# Patient Record
Sex: Female | Born: 1935 | Race: White | Hispanic: No | Marital: Single | Smoking: Former smoker
Health system: Southern US, Community
[De-identification: ages and names within clinical notes are randomized; demographics above are authoritative.]

## PROBLEM LIST (undated history)

## (undated) DIAGNOSIS — I951 Orthostatic hypotension: Secondary | ICD-10-CM

## (undated) DIAGNOSIS — L02619 Cutaneous abscess of unspecified foot: Secondary | ICD-10-CM

## (undated) DIAGNOSIS — I495 Sick sinus syndrome: Secondary | ICD-10-CM

## (undated) DIAGNOSIS — I071 Rheumatic tricuspid insufficiency: Secondary | ICD-10-CM

## (undated) DIAGNOSIS — N189 Chronic kidney disease, unspecified: Secondary | ICD-10-CM

## (undated) DIAGNOSIS — I10 Essential (primary) hypertension: Secondary | ICD-10-CM

## (undated) DIAGNOSIS — I272 Pulmonary hypertension, unspecified: Secondary | ICD-10-CM

## (undated) DIAGNOSIS — I472 Ventricular tachycardia: Secondary | ICD-10-CM

## (undated) DIAGNOSIS — L03119 Cellulitis of unspecified part of limb: Secondary | ICD-10-CM

## (undated) DIAGNOSIS — C180 Malignant neoplasm of cecum: Secondary | ICD-10-CM

## (undated) DIAGNOSIS — K7460 Unspecified cirrhosis of liver: Secondary | ICD-10-CM

## (undated) DIAGNOSIS — K2971 Gastritis, unspecified, with bleeding: Secondary | ICD-10-CM

## (undated) DIAGNOSIS — M4800 Spinal stenosis, site unspecified: Secondary | ICD-10-CM

## (undated) DIAGNOSIS — G903 Multi-system degeneration of the autonomic nervous system: Secondary | ICD-10-CM

## (undated) DIAGNOSIS — I5042 Chronic combined systolic (congestive) and diastolic (congestive) heart failure: Secondary | ICD-10-CM

## (undated) DIAGNOSIS — R1110 Vomiting, unspecified: Secondary | ICD-10-CM

## (undated) DIAGNOSIS — I5032 Chronic diastolic (congestive) heart failure: Secondary | ICD-10-CM

## (undated) DIAGNOSIS — I4729 Other ventricular tachycardia: Secondary | ICD-10-CM

## (undated) DIAGNOSIS — I059 Rheumatic mitral valve disease, unspecified: Secondary | ICD-10-CM

## (undated) DIAGNOSIS — D649 Anemia, unspecified: Secondary | ICD-10-CM

## (undated) DIAGNOSIS — J471 Bronchiectasis with (acute) exacerbation: Secondary | ICD-10-CM

## (undated) DIAGNOSIS — I482 Chronic atrial fibrillation, unspecified: Secondary | ICD-10-CM

## (undated) DIAGNOSIS — K922 Gastrointestinal hemorrhage, unspecified: Secondary | ICD-10-CM

## (undated) HISTORY — DX: Chronic kidney disease, unspecified: N18.9

## (undated) HISTORY — DX: Multi-system degeneration of the autonomic nervous system: G90.3

## (undated) HISTORY — PX: APPENDECTOMY: SHX54

## (undated) HISTORY — DX: Sick sinus syndrome: I49.5

## (undated) HISTORY — DX: Chronic atrial fibrillation, unspecified: I48.20

## (undated) HISTORY — PX: VALVE REPLACEMENT: SUR13

## (undated) HISTORY — DX: Gastritis, unspecified, with bleeding: K29.71

## (undated) HISTORY — DX: Spinal stenosis, site unspecified: M48.00

## (undated) HISTORY — DX: Orthostatic hypotension: I95.1

## (undated) HISTORY — DX: Malignant neoplasm of cecum: C18.0

## (undated) HISTORY — PX: CHOLECYSTECTOMY: SHX55

## (undated) HISTORY — DX: Chronic combined systolic (congestive) and diastolic (congestive) heart failure: I50.42

## (undated) HISTORY — DX: Bronchiectasis with (acute) exacerbation: J47.1

## (undated) HISTORY — DX: Rheumatic tricuspid insufficiency: I07.1

## (undated) HISTORY — DX: Vomiting, unspecified: R11.10

## (undated) HISTORY — DX: Chronic diastolic (congestive) heart failure: I50.32

## (undated) HISTORY — DX: Anemia, unspecified: D64.9

## (undated) HISTORY — DX: Pulmonary hypertension, unspecified: I27.20

---

## 2001-12-03 ENCOUNTER — Other Ambulatory Visit: Admission: RE | Admit: 2001-12-03 | Discharge: 2001-12-03 | Payer: Self-pay | Admitting: Geriatric Medicine

## 2001-12-12 ENCOUNTER — Encounter: Payer: Self-pay | Admitting: Geriatric Medicine

## 2001-12-12 ENCOUNTER — Encounter: Admission: RE | Admit: 2001-12-12 | Discharge: 2001-12-12 | Payer: Self-pay | Admitting: Geriatric Medicine

## 2001-12-21 ENCOUNTER — Encounter: Admission: RE | Admit: 2001-12-21 | Discharge: 2001-12-21 | Payer: Self-pay | Admitting: Geriatric Medicine

## 2001-12-21 ENCOUNTER — Encounter: Payer: Self-pay | Admitting: Geriatric Medicine

## 2001-12-24 ENCOUNTER — Encounter: Payer: Self-pay | Admitting: Geriatric Medicine

## 2001-12-24 ENCOUNTER — Encounter: Admission: RE | Admit: 2001-12-24 | Discharge: 2001-12-24 | Payer: Self-pay | Admitting: Geriatric Medicine

## 2002-01-07 ENCOUNTER — Encounter: Payer: Self-pay | Admitting: Geriatric Medicine

## 2002-01-07 ENCOUNTER — Encounter: Admission: RE | Admit: 2002-01-07 | Discharge: 2002-01-07 | Payer: Self-pay | Admitting: Geriatric Medicine

## 2002-04-06 ENCOUNTER — Encounter: Payer: Self-pay | Admitting: Geriatric Medicine

## 2002-04-06 ENCOUNTER — Encounter: Admission: RE | Admit: 2002-04-06 | Discharge: 2002-04-06 | Payer: Self-pay | Admitting: Geriatric Medicine

## 2002-05-09 ENCOUNTER — Ambulatory Visit (HOSPITAL_COMMUNITY): Admission: RE | Admit: 2002-05-09 | Discharge: 2002-05-09 | Payer: Self-pay | Admitting: Cardiology

## 2002-06-24 ENCOUNTER — Encounter: Payer: Self-pay | Admitting: Cardiothoracic Surgery

## 2002-06-24 ENCOUNTER — Encounter: Admission: RE | Admit: 2002-06-24 | Discharge: 2002-06-24 | Payer: Self-pay | Admitting: Cardiothoracic Surgery

## 2002-07-07 ENCOUNTER — Ambulatory Visit (HOSPITAL_COMMUNITY): Admission: RE | Admit: 2002-07-07 | Discharge: 2002-07-07 | Payer: Self-pay | Admitting: Cardiology

## 2002-07-07 ENCOUNTER — Encounter (INDEPENDENT_AMBULATORY_CARE_PROVIDER_SITE_OTHER): Payer: Self-pay | Admitting: Cardiology

## 2002-07-11 ENCOUNTER — Encounter: Payer: Self-pay | Admitting: Cardiothoracic Surgery

## 2002-07-13 ENCOUNTER — Encounter: Payer: Self-pay | Admitting: Cardiothoracic Surgery

## 2002-07-13 ENCOUNTER — Inpatient Hospital Stay (HOSPITAL_COMMUNITY): Admission: RE | Admit: 2002-07-13 | Discharge: 2002-07-28 | Payer: Self-pay | Admitting: Cardiothoracic Surgery

## 2002-07-14 ENCOUNTER — Encounter: Payer: Self-pay | Admitting: Cardiothoracic Surgery

## 2002-07-15 ENCOUNTER — Encounter: Payer: Self-pay | Admitting: Cardiothoracic Surgery

## 2002-07-16 ENCOUNTER — Encounter: Payer: Self-pay | Admitting: Cardiothoracic Surgery

## 2002-07-17 ENCOUNTER — Encounter: Payer: Self-pay | Admitting: Cardiothoracic Surgery

## 2002-07-18 ENCOUNTER — Encounter: Payer: Self-pay | Admitting: Cardiothoracic Surgery

## 2002-07-19 ENCOUNTER — Encounter: Payer: Self-pay | Admitting: Cardiothoracic Surgery

## 2002-07-20 ENCOUNTER — Encounter: Payer: Self-pay | Admitting: Cardiothoracic Surgery

## 2002-07-25 ENCOUNTER — Encounter: Payer: Self-pay | Admitting: Cardiothoracic Surgery

## 2002-07-26 ENCOUNTER — Encounter: Payer: Self-pay | Admitting: Cardiothoracic Surgery

## 2002-09-12 ENCOUNTER — Encounter (HOSPITAL_COMMUNITY): Admission: RE | Admit: 2002-09-12 | Discharge: 2002-12-11 | Payer: Self-pay | Admitting: Cardiology

## 2002-09-16 ENCOUNTER — Inpatient Hospital Stay (HOSPITAL_COMMUNITY): Admission: AD | Admit: 2002-09-16 | Discharge: 2002-09-19 | Payer: Self-pay | Admitting: Cardiology

## 2002-10-03 ENCOUNTER — Ambulatory Visit (HOSPITAL_COMMUNITY): Admission: RE | Admit: 2002-10-03 | Discharge: 2002-10-03 | Payer: Self-pay | Admitting: Cardiology

## 2002-12-12 ENCOUNTER — Encounter (HOSPITAL_COMMUNITY): Admission: RE | Admit: 2002-12-12 | Discharge: 2003-03-12 | Payer: Self-pay | Admitting: Cardiology

## 2003-03-29 ENCOUNTER — Encounter: Admission: RE | Admit: 2003-03-29 | Discharge: 2003-03-29 | Payer: Self-pay | Admitting: Geriatric Medicine

## 2003-03-29 ENCOUNTER — Encounter: Payer: Self-pay | Admitting: Geriatric Medicine

## 2003-03-29 ENCOUNTER — Encounter: Payer: Self-pay | Admitting: Diagnostic Radiology

## 2003-04-12 ENCOUNTER — Encounter: Admission: RE | Admit: 2003-04-12 | Discharge: 2003-04-12 | Payer: Self-pay | Admitting: Geriatric Medicine

## 2003-04-12 ENCOUNTER — Encounter: Payer: Self-pay | Admitting: Geriatric Medicine

## 2003-04-26 ENCOUNTER — Encounter: Admission: RE | Admit: 2003-04-26 | Discharge: 2003-04-26 | Payer: Self-pay | Admitting: Geriatric Medicine

## 2003-04-26 ENCOUNTER — Encounter: Payer: Self-pay | Admitting: Geriatric Medicine

## 2004-09-05 ENCOUNTER — Ambulatory Visit (HOSPITAL_COMMUNITY): Admission: RE | Admit: 2004-09-05 | Discharge: 2004-09-05 | Payer: Self-pay | Admitting: Cardiology

## 2004-09-16 ENCOUNTER — Encounter: Admission: RE | Admit: 2004-09-16 | Discharge: 2004-09-16 | Payer: Self-pay | Admitting: Geriatric Medicine

## 2005-01-23 ENCOUNTER — Encounter: Admission: RE | Admit: 2005-01-23 | Discharge: 2005-01-23 | Payer: Self-pay | Admitting: Geriatric Medicine

## 2005-02-17 ENCOUNTER — Encounter: Admission: RE | Admit: 2005-02-17 | Discharge: 2005-02-17 | Payer: Self-pay | Admitting: Geriatric Medicine

## 2005-07-07 ENCOUNTER — Ambulatory Visit: Payer: Self-pay | Admitting: Critical Care Medicine

## 2005-07-09 ENCOUNTER — Other Ambulatory Visit: Admission: RE | Admit: 2005-07-09 | Discharge: 2005-07-09 | Payer: Self-pay | Admitting: Geriatric Medicine

## 2005-07-11 ENCOUNTER — Ambulatory Visit: Payer: Self-pay | Admitting: Internal Medicine

## 2005-07-15 ENCOUNTER — Ambulatory Visit (HOSPITAL_COMMUNITY): Admission: RE | Admit: 2005-07-15 | Discharge: 2005-07-15 | Payer: Self-pay | Admitting: Cardiology

## 2006-06-19 ENCOUNTER — Encounter: Admission: RE | Admit: 2006-06-19 | Discharge: 2006-06-19 | Payer: Self-pay | Admitting: Urology

## 2006-07-29 ENCOUNTER — Encounter: Admission: RE | Admit: 2006-07-29 | Discharge: 2006-07-29 | Payer: Self-pay | Admitting: Geriatric Medicine

## 2006-08-19 ENCOUNTER — Ambulatory Visit (HOSPITAL_COMMUNITY): Admission: RE | Admit: 2006-08-19 | Discharge: 2006-08-19 | Payer: Self-pay | Admitting: Cardiology

## 2006-09-02 ENCOUNTER — Ambulatory Visit: Payer: Self-pay | Admitting: Internal Medicine

## 2006-09-09 ENCOUNTER — Ambulatory Visit (HOSPITAL_COMMUNITY): Admission: RE | Admit: 2006-09-09 | Discharge: 2006-09-09 | Payer: Self-pay | Admitting: Internal Medicine

## 2006-09-09 ENCOUNTER — Ambulatory Visit: Payer: Self-pay | Admitting: Cardiology

## 2006-09-17 ENCOUNTER — Ambulatory Visit: Payer: Self-pay | Admitting: Internal Medicine

## 2006-09-23 ENCOUNTER — Ambulatory Visit: Payer: Self-pay | Admitting: Internal Medicine

## 2006-09-30 ENCOUNTER — Ambulatory Visit: Payer: Self-pay | Admitting: Internal Medicine

## 2006-09-30 ENCOUNTER — Ambulatory Visit (HOSPITAL_COMMUNITY): Admission: RE | Admit: 2006-09-30 | Discharge: 2006-10-01 | Payer: Self-pay | Admitting: Internal Medicine

## 2006-10-06 ENCOUNTER — Ambulatory Visit: Payer: Self-pay

## 2006-10-06 ENCOUNTER — Encounter: Payer: Self-pay | Admitting: Internal Medicine

## 2006-10-06 ENCOUNTER — Ambulatory Visit: Payer: Self-pay | Admitting: Internal Medicine

## 2006-10-07 ENCOUNTER — Ambulatory Visit: Payer: Self-pay

## 2006-10-22 ENCOUNTER — Encounter: Admission: RE | Admit: 2006-10-22 | Discharge: 2006-10-22 | Payer: Self-pay | Admitting: Geriatric Medicine

## 2006-10-22 ENCOUNTER — Ambulatory Visit: Payer: Self-pay | Admitting: Internal Medicine

## 2006-10-23 ENCOUNTER — Inpatient Hospital Stay (HOSPITAL_COMMUNITY): Admission: AD | Admit: 2006-10-23 | Discharge: 2006-10-26 | Payer: Self-pay | Admitting: Cardiology

## 2006-10-26 ENCOUNTER — Ambulatory Visit (HOSPITAL_COMMUNITY): Admission: RE | Admit: 2006-10-26 | Discharge: 2006-10-26 | Payer: Self-pay | Admitting: Internal Medicine

## 2007-01-12 ENCOUNTER — Encounter: Admission: RE | Admit: 2007-01-12 | Discharge: 2007-01-12 | Payer: Self-pay | Admitting: Geriatric Medicine

## 2007-01-20 ENCOUNTER — Encounter: Admission: RE | Admit: 2007-01-20 | Discharge: 2007-01-20 | Payer: Self-pay | Admitting: Geriatric Medicine

## 2007-07-17 ENCOUNTER — Encounter: Admission: RE | Admit: 2007-07-17 | Discharge: 2007-07-17 | Payer: Self-pay | Admitting: Orthopaedic Surgery

## 2007-07-26 ENCOUNTER — Observation Stay (HOSPITAL_COMMUNITY): Admission: AD | Admit: 2007-07-26 | Discharge: 2007-07-26 | Payer: Self-pay | Admitting: Cardiology

## 2007-09-06 ENCOUNTER — Ambulatory Visit: Payer: Self-pay

## 2007-09-06 ENCOUNTER — Inpatient Hospital Stay (HOSPITAL_COMMUNITY): Admission: AD | Admit: 2007-09-06 | Discharge: 2007-09-10 | Payer: Self-pay | Admitting: Cardiology

## 2007-12-03 ENCOUNTER — Ambulatory Visit (HOSPITAL_COMMUNITY): Admission: RE | Admit: 2007-12-03 | Discharge: 2007-12-04 | Payer: Self-pay | Admitting: Orthopaedic Surgery

## 2008-02-07 ENCOUNTER — Encounter: Admission: RE | Admit: 2008-02-07 | Discharge: 2008-02-07 | Payer: Self-pay | Admitting: Orthopaedic Surgery

## 2008-08-02 ENCOUNTER — Ambulatory Visit: Payer: Self-pay | Admitting: Cardiovascular Disease

## 2008-10-23 ENCOUNTER — Ambulatory Visit (HOSPITAL_COMMUNITY): Admission: RE | Admit: 2008-10-23 | Discharge: 2008-10-23 | Payer: Self-pay | Admitting: Cardiology

## 2009-03-08 ENCOUNTER — Encounter (INDEPENDENT_AMBULATORY_CARE_PROVIDER_SITE_OTHER): Payer: Self-pay | Admitting: *Deleted

## 2009-04-24 ENCOUNTER — Encounter: Admission: RE | Admit: 2009-04-24 | Discharge: 2009-04-24 | Payer: Self-pay | Admitting: Cardiology

## 2009-06-15 ENCOUNTER — Encounter: Admission: RE | Admit: 2009-06-15 | Discharge: 2009-06-15 | Payer: Self-pay | Admitting: Orthopaedic Surgery

## 2010-12-08 ENCOUNTER — Encounter: Payer: Self-pay | Admitting: Gastroenterology

## 2010-12-08 ENCOUNTER — Encounter: Payer: Self-pay | Admitting: Geriatric Medicine

## 2010-12-08 ENCOUNTER — Encounter: Payer: Self-pay | Admitting: Orthopaedic Surgery

## 2011-04-01 NOTE — Letter (Signed)
August 02, 2008    [name], MD  [address] [place]  [place], [place] [address]   RE:  Sherry, [name]  MRN:  [id number]  /  DOB:  August 03, 1926   Dear Tressia Miners,   I had the pleasure to see [name] today at your request because of  concerns about her atrial fibrillation.  She had come to see you last  week because of complaints of swelling and you had increased her  diuretic.  That has not quite had a chance to take effect.  There was  also concern about having more atrial fibrillation.  I went back through  the records, which are somewhat incomplete, but looking back at the  initiation of Tikosyn last October after she had failed amiodarone, dose  of 125 mcg was chosen.  It was maintained in the context of an episode  of nonsustained VT and transient hypokalemia.  There was some question  in Gregg's mind whether a higher dose might be usable.  It was elected  to leave her on that dose.  I do not have intercurrent data except from  the March to the September time frame where in it was noted that she has  been in atrial fibrillation about 30% of the time.  However, for most of  the last 6 months she has felt really quite well until just recently  suggesting that some of her symptoms are not related to atrial  fibrillation itself, but may be some other consequence.   The other thing that I noted on interrogation of her device is that she  had been programmed into the DDIR mode.  This would result in her having  a nontracking pacemaker function in normal rhythm, which particularly  with her BiV would be deleterious.  This may also be contributing.   Her renal function has been stable.  The last numbers I see from your  office here today were 1 down from a high of 18 that I saw a number of  months ago.   Her medications include digoxin 0.0625, Lasix 20, Tikosyn 125 b.i.d.,  Fosamax, Coreg 12.5 b.i.d., and Coumadin.  She apparently is also taking  a potassium  supplementation.   She has no known drug allergies.   On examination, her blood pressure is 127/68, her pulse is 70, her  weight was 230, which is down 8 pounds in 6 months and down 15 pounds in  2 years.  Her lungs were clear.  Her neck veins were flat.  Her heart  sounds were regular.  The extremities had 1 to 2+ edema.  The skin was  warm and dry.   Interrogation of her pacemaker was notable as above.   I reprogrammed the mode to DDD.  I asked her to try and correlate her  symptoms with specific days that we can look back on the atrial  fibrillation log in her pacemaker and see if there is an association.  In the event that there was one found, it would be at least worth  considering readmitting her to hospital for uptitration of her  amiodarone.  However, I suspect that what we will find is that managing  her edema will suffice to mitigate her symptoms and no further  interventions will be necessary.   Thanks very much for allowing [place] to see her.    Sincerely,      Deboraha Sprang, MD, [facility]  Electronically Signed    SCK/MedQ  DD:  08/02/2008  DT: 08/03/2008  Job #: [id number]

## 2011-04-01 NOTE — Op Note (Signed)
NAMEAVONA, CONLIN               ACCOUNT NO.:  [id number]   MEDICAL RECORD NO.:  [id number]          PATIENT TYPE:  INP   LOCATION:  2019                         FACILITY:  [facility]   PHYSICIAN:  Champ Mungo. Lovena Le, MD    DATE OF BIRTH:  Mar 01, 1926   DATE OF PROCEDURE:  09/09/2007  DATE OF DISCHARGE:  09/10/2007                               OPERATIVE REPORT   PROCEDURE PERFORMED:  DC cardioversion.   INDICATIONS:  Symptomatic atrial fibrillation.   INTRODUCTION:  The patient is a 77 year old woman with a history of  persistent atrial fibrillation who was admitted to the hospital for  [place] therapy.  She has not returned to sinus rhythm spontaneously and  she is now referred for DC cardioversion.   PROCEDURE:  After informed consent was obtained, the patient was prepped  in the usual manner.  The electrode dispersive pads were placed in the  anterior, posterior position, 200 joules of synchronized biphasic energy  was subsequently delivered through the electrode dispersing pads  restoring sinus rhythm.  A 12-lead EKG was ordered.  The patient  tolerated the procedure well, 10 of Valium and 15 mcg of fentanyl were  given intravenously for sedation.   COMPLICATIONS:  There were no immediate procedure complications.   RESULTS:  This demonstrates successful DC cardioversion in a patient  with persistent atrial fibrillation who was admitted to the hospital for  Tikosyn loading.      Champ Mungo. Lovena Le, MD  Electronically Signed     GWT/MEDQ  D:  09/09/2007  T:  09/10/2007  Job:  [id number]   cc:   [name], M.D.

## 2011-04-01 NOTE — Op Note (Signed)
[name], [name]                ACCOUNT NO.:  [id number]   MEDICAL RECORD NO.:  [id number]          PATIENT TYPE:  OIB   LOCATION:  2899                         FACILITY:  [facility]   PHYSICIAN:  [name], M.D.     DATE OF BIRTH:  05-02-1926   DATE OF PROCEDURE:  10/23/2008  DATE OF DISCHARGE:  10/23/2008                               OPERATIVE REPORT   REFERRING PHYSICIAN:  Hal T. Timblin, MD   PROCEDURE:  Direct current cardioversion.   OPERATOR:  [name], MD   INDICATIONS:  Atrial fibrillation.   COMPLICATIONS:  None.   IV MEDICATIONS:  Pentothal 200 mg.   INDICATIONS FOR PROCEDURE:  This is an 77 year old female who has a  history of tachybrady syndrome and has been on Tikosyn for suppression  of atrial fibrillation.  She is also status post permanent pacemaker  placement.  She recently presented to the hospital with recurrent atrial  fibrillation, now presents for cardioversion.  She has been adequately  anticoagulated with an INR of greater than 2 for over 4 weeks.   DESCRIPTION OF PROCEDURE:  The patient was brought to the day hospital  in a fasting nonsedated state.  Informed consent was obtained.  The  patient was connected to continuous heart rate and pulse oximetry  monitoring and intermittent blood pressure monitoring.  Defibrillator  pads were placed on the anterior and posterior chest on the left side.  After adequate anesthesia was obtained, a synchronized biphasic 120  joules shock was delivered which successfully converted the patient to  sinus rhythm and actually AV paced rhythm.  The patient tolerated the  procedure well with no complications.  Once the patient was fully awake  and ambulating, she was discharged to home.   ASSESSMENT:  1. Paroxysmal atrial fibrillation, currently in atrial fibrillation.  2. Systemic anticoagulation with therapeutic INR for greater than 4      weeks.  3. Tachybrady syndrome status post permanent pacemaker  placement.  4. Successful cardioversion to atrioventricular paced rhythm.   PLAN:  1. Discharge to home after fully awake and ambulating.  2. Increase Coreg to 12.5 mg b.i.d.  3. Follow up with me in 2 weeks.      [name], M.D.  Electronically Signed     TT/MEDQ  D:  10/23/2008  T:  10/23/2008  Job:  [id number]   cc:   Hal T. Stoneking, M.D.

## 2011-04-01 NOTE — Consult Note (Signed)
[name], [name]               ACCOUNT NO.:  [id number]   MEDICAL RECORD NO.:  [id number]          PATIENT TYPE:  INP   LOCATION:  2019                         FACILITY:  [facility]   PHYSICIAN:  Champ Mungo. Lovena Le, MD    DATE OF BIRTH:  08-19-1926   DATE OF CONSULTATION:  09/09/2007  DATE OF DISCHARGE:  09/10/2007                                 CONSULTATION   INDICATION FOR CONSULTATION:  Evaluation of atrial fibrillation and  Tikosyn therapy in a patient with nonsustained VT and congestive heart  failure.   HISTORY OF PRESENT ILLNESS:  The patient is a 77 year old morbidly obese  middle-aged woman with a history of mitral valve regurgitation status  post mitral valve repair also with a history of persistent atrial  fibrillation and nonsustained VT and congestive heart failure thought to  be due to both systolic and diastolic dysfunction.  She has had  persistent atrial fibrillation for several years and initially was on  amiodarone, but unfortunately has developed recurrent atrial  fibrillation.  Her amiodarone was stopped over a year ago.  She  underwent BiV pacemaker upgrade, but has continued to have heart failure  symptoms.  She does note that she has undergone multiple cardioversions,  and whenever she has been cardioverted and stays in sinus rhythm she  feels better.  She was admitted to the hospital for initiation of  Tikosyn therapy.  Because of her prolonged baseline prolongation of the  QRS and the QT interval secondary to pacing, her Tikosyn dose was  decreased from 250 mcg twice daily which had been calculated out based  on her creatinine clearance down to 125 mcg twice daily.  She is now  referred for additional evaluation and additional recommendations.   ADDITIONAL PAST MEDICAL HISTORY:  [name] additional past medical  history is notable for no known coronary disease.  She has a history of  morbid obesity.  She has a history of spinal stenosis and weakness in  her lower extremity secondary to this.  She has a history of chronic  Coumadin therapy with therapeutic INR's.   FAMILY HISTORY:  Was really noncontributory.   SOCIAL HISTORY:  The patient denies tobacco or ethanol abuse.   REVIEW OF SYSTEMS:  Is as noted in the HPI.  Also she has pain and  weakness in her legs bilaterally.  She has generalized fatigue.  She  gets shortness of breath with exertion.  She has dyspnea.  Rest of her  Review of Systems was negative.   PHYSICAL EXAMINATION:  GENERAL:  She is a pleasant obese elderly woman  in no distress.  VITAL SIGNS:  Blood pressure 112/60, pulse 73 and regular, respirations  18, temperature 98.  HEENT:  Normocephalic, atraumatic.  Pupils are equal and round.  Oropharynx moist.  Sclerae anicteric.  NECK:  Revealed no jugular venous distention.  There was no thyromegaly.  Trachea is midline.  Carotids 2+ and symmetric.  LUNGS:  Clear bilaterally to auscultation.  No wheezes, rales or rhonchi  are present.  CARDIAC:  Regular rate and rhythm with split S2.  There  were no obvious  murmurs, rubs or gallops appreciated.  ABDOMEN:  Was obese, nontender, nondistended.  No organomegaly.  Bowel  sounds are present.  No rebound or guarding.  EXTREMITIES:  Demonstrated no cyanosis, clubbing or edema.  Pulses are  2+ and symmetric.  NEUROLOGIC:  Alert and oriented x3, cranial nerves intact.  Strength 5/5  and symmetric.   EKG demonstrates atrial fibrillation with biventricular pacing.  The QRS  duration was 160 milliseconds.   IMPRESSION:  1. Persistent atrial fibrillation.  2. Admission for Tikosyn therapy.  3. Prolonged QRS secondary to biventricular pacing.  4. Mitral regurgitation status post mitral valve repair.  5. Mixed congestive heart failure secondary to both systolic and      diastolic dysfunction both acute and chronic.  6. Mild hypokalemia.   DISCUSSION:  First I have recommended repletion of her potassium.  Secondly I would  recommend DC cardioversion now that she has over five  doses of Tikosyn.  Consideration could be made for going back up on her  Tikosyn dose 250 twice daily which would make her more likely to  maintain sinus rhythm.  However, with her other comorbidities and at  least one documented episode of nonsustained VT I think it is reasonable  to try her at the lower dose.  Ultimate likelihood of  maintaining sinus  rhythm is less based on her low dose of Tikosyn, underlying mitral valve  disease, and triopathy secondary to this and the fact that she failed  amiodarone in the past along with a relative longstanding atrial  fibrillation.      Champ Mungo. Lovena Le, MD  Electronically Signed     GWT/MEDQ  D:  09/10/2007  T:  09/11/2007  Job:  [id number]   cc:   [name], M.D.  Hal T. Stoneking, M.D.

## 2011-04-01 NOTE — Op Note (Signed)
[name], [name]               ACCOUNT NO.:  [id number]   MEDICAL RECORD NO.:  [id number]          PATIENT TYPE:  OIB   LOCATION:  2550                         FACILITY:  [facility]   PHYSICIAN:  Mark C. Lorin Mercy, M.D.    DATE OF BIRTH:  [birth year]   DATE OF PROCEDURE:  12/03/2007  DATE OF DISCHARGE:                               OPERATIVE REPORT   PREOPERATIVE DIAGNOSIS:  L4-5 spinal stenosis.   POSTOPERATIVE DIAGNOSIS:  L4-5 spinal stenosis.   PROCEDURE:  L4-5 decompression.   SURGEON:  Rodell Perna, MD   ASSISTANT:  Phillips Hay, PA-C.   ANESTHESIA:  GOT plus Marcaine local.   SKIN CLOSURE:  Subcuticular.   ESTIMATED BLOOD LOSS:  150 mL.   BRIEF HISTORY:  A 77 year old female who has been on chronic Coumadin  with a pacemaker, has been on Lovenox for the last five days and has  numerous bruises over her abdomen where she has been injecting Lovenox.  Protime has returned to normal at 1.1, which was checked preop and on  November 29, 2007, four days ago, her INR was 1.5.  INR is now 1.1.   PROCEDURE:  After induction of general anesthesia, the patient was  placed prone, preoperative antibiotics was given, area was squared with  towels.  Betadine and Vi-drape application and then time-out procedure  with confirmation.   A spinal needle was inserted and an x-ray was taken with a double  exposure due to the patient's body habitus and morbid obesity.  First x-  ray showed the needle was down at S1 since bony landmarks such as iliac  crest were difficult to palpate.  Needle was repositioned more  proximally, incision was made and needle was inspected to be just above  the 4-5 disk space.  Cross-table x-ray confirmed this.  Incision was  made down to the lamina.  The lamina of L4 was removed to the top of L5.  Bone bur was used, extended down to the hypertrophic ligamentum and then  large chunks of ligament were removed.  Operative microscope was draped  and brought in.  There were  some small bleeders present in the fatty  layer, muscular layer and bone wax was used as well as Gelfoam and  paddies to try to maintain hemostasis with the patient's expected  postoperative anticoagulation.  There was significant spur overhang over  the nerve root.  Disk was inspected on both sides and showed some mild  protrusion, but no microdiskectomy was performed.  Nerve roots were  decompressed, followed out until they were free and all thick chunks of  ligament were removed and bone was decompressed out to the level of the  pedicle.  Foraminotomies were performed bilaterally.  The wound was  irrigated.  Some epineural veins were coagulated.  After irrigation,  Hemovac was placed  with the patient restarting Lovenox to help with any possible hematoma.  Deep fascia closed with 0 Vicryl, 2-0 Vicryl subcutaneous tissue,  subcuticular skin closure.  Tincture of Benzoin and Steri-Strips, postop  dressing and transferred to the recovery room in stable condition.  Instrument  and needle count was correct.      Mark C. Lorin Mercy, M.D.  Electronically Signed     MCY/MEDQ  D:  12/03/2007  T:  12/03/2007  Job:  [id number]

## 2011-04-04 NOTE — Letter (Signed)
October 07, 2006    [name], M.D.  [address] [place], [place] [address]   RE:  [name], Sherry  MRN:  [id number]  /  DOB:  [birth year]   Dear [name],   [name] comes in following  resynchronization for congestive heart  failure in the setting of modest LV function, prior pacemaker implantation  for complete heart block.  Since having gone home, she has lost 7 pounds.  She continues to complain of shortness of breath.  She also complains of  recurrent episodes of nausea, vomiting, which predate her procedure.  In  fact, they go back for some time.  She had an episode yesterday where she  was quite pale.   She was seen yesterday by Dr. Lovena Le.  An echo was obtained that  demonstrated no effusion, improvement in the left ventricular function.   Laboratories were obtained with a somewhat elevated BUN and creatinine,  going from 22/1.6 to 22/1.7.  BNP was 187 with one not previously measured.   Her medications currently include Coreg 12.5 b.i.d., furosemide 80 b.i.d.,  Coumadin and Diovan.   PHYSICAL EXAMINATION:  VITAL SIGNS:  Blood pressure 118/58, pulse 76.  LUNGS:  Clear.  HEART:  Heart sounds were regular.  EXTREMITIES:  Without edema.   IMPRESSION:  1. Heart failure, systolic and diastolic.  2. Paroxysmal atrial fibrillation, holding sinus rhythm.  3. Nausea and vomiting, recurrent.   I have suggested to the patient that she follow up with Dr. Felipa Eth for  consideration of a GI evaluation to see if we can make some sense out of her  a.m. nausea and vomiting.  We will otherwise continue on her current  medication, although down-titration of her diuretics may be indicated, and  in fact, we probably want to get a repeat BMET in about 10 days to make sure  we are not aggravating prerenal azotemia.  She is to see [place] one more time,  and then we will plan to have her follow up with Dr. Radford Pax.    Sincerely,      Deboraha Sprang, MD, [facility]  Electronically Signed    SCK/MedQ  DD: 10/07/2006  DT: 10/07/2006  Job #: [id number]   CC:    Hal T. Stoneking, M.D.

## 2011-04-04 NOTE — Op Note (Signed)
[name], [name]                          ACCOUNT NO.:  [id number]   MEDICAL RECORD NO.:  [id number]                   PATIENT TYPE:  INP   LOCATION:  2316                                 FACILITY:  [facility]   PHYSICIAN:  Ivin Poot III, M.D.           DATE OF BIRTH:  1926/01/27   DATE OF PROCEDURE:  07/13/2002  DATE OF DISCHARGE:                                 OPERATIVE REPORT   OPERATION:  1. Mitral valve repair with a 28 mm Carpentier ring Annuloplasty.  2. Oversewing of left atrial appendage.   SURGEON:  Ivin Poot, M.D.   ASSISTANT:  Mary Sella. Steward, P.A.   ANESTHESIA:  General   ANESTHESIOLOGIST:  Finis Bud, M.D.   PREOPERATIVE DIAGNOSIS:  3+ mitral regurgitation with class III congestive  heart failure, atrial fibrillation.   POSTOPERATIVE DIAGNOSIS:  3+ mitral regurgitation with class III congestive  heart failure, atrial fibrillation.   INDICATIONS FOR PROCEDURE:  The patient is a 77 year old white female with  two to three months onset of progress dyspnea on exertion.  She was found to  be in new onset atrial fibrillation which was rate controlled and she was  placed on Coumadin.  She underwent cardiac catheterization and 2D  echocardiogram evaluation which showed significant mitral regurgitation with  moderately elevated pulmonary artery pressures.  Her coronary arteries had  no significant disease.  She was felt to be a surgical candidate for mitral  valve repair.  Prior to the surgery, I examined the patient in the office  and read the results of the cardiac catheterization and 2D echocardiograms  with the patient and family.  I discussed the indications and expected  benefits of mitral valve repair for treatment of her mitral regurgitation.  I reviewed the alternative therapies to treat her mitral regurgitation  besides surgical repair.  I discussed with the patient and family the major  aspects of the proposed operation including location  of the surgical  incision, the preference to do a mitral valve repair over mitral valve  replacement for treatment of the regurgitation, the use of general  anesthesia and cardiopulmonary bypass, and the expected postoperative  hospital recovery.  Discussed with the patient, the risks to her of mitral  valve repair for treatment of her mitral regurgitation including the risk of  MI, CVA, bleeding, infection, prolonged ventilator dependence, and death.  She understood these implications for the surgery and agreed to proceed with  the operation as planned under what I felt was an informed consent.   OPERATIVE FINDINGS:  Exposure of the mitral valve was very difficult due to  the patient's body habitus being short and obese.  The valve leaflets showed  no evidence of prolapse and the subvalvular apparatus showed no significant  pathology.  Central regurgitation was the mechanism and was treated with a  ring anuloplasty.  The patient had a significant anemia on bypass, the  hemoglobin was 6.5 and was given a blood transfusion.  She had significant  post pump coagulopathy possibly related to her prolonged preoperative  Coumadin use and she was given FFP and platelets at the termination of  bypass.  She remained in atrial flutter rhythm on leaving the operating  room.  The heart rate was controlled at 80 per minute.   DESCRIPTION OF PROCEDURE:  The patient was brought to the operating room and  placed supine on the operating room table where general anesthesia was  induced under invasive hemodynamic monitoring.  The chest, abdomen and legs  were prepped with Betadine and draped as a sterile field.  A transesophageal  2D echocardiogram exam of the heart was performed by Dr. Oletta Lamas from  anesthesia and this documented the preoperative diagnosis of significant 3+  mitral regurgitation.  A sternal incision was made and the sternal retractor  was placed.  The pericardium was opened.  Heparin was  administered and the  ACT was documented as being therapeutic.  Pursestring placed in the  ascending aorta and right atrium.  The patient was cannulated and placed on  bypass.  A second venous cannula was placed for bicaval venous drainage.  The interatrial groove was dissected to expose the left atrium and  cardioplegic cannulae were placed with both antegrade and retrograde  delivery of cold blood cardioplegia.  Caval tapes were placed around the SVC  and IVC and the patient was cooled to 28 degrees.  As the aortic cross clamp  was applied, total of 700 cc of cold blood cardioplegia was given in split  doses between the antegrade aortic and retrograde coronary sinus catheters.  There was good cardioplegic arrest with septal temperature dropping less  than 14 degrees.  Topical iced saline slush was used to augment myocardial  preservation and a pericardial instillator pad was used to protect the left  phrenic nerve.   A left atriotomy was performed.  The atrial retractors were positioned to  expose the mitral valve which was very deep and difficult to visualize.  The  valve was analyzed and found to have no prolapse and with evidence of some  central mitral regurgitation.  The anterior leaflet and anulus were sized to  a 28 mm Carpentier ring, 2-0 Ethibon sutures were placed around the annulus  to perform the anuloplasty.  The suture was then brought through the 28 mm  Carpentier class ring and the ring was seated and the sutures were tied.  The valve was cut and tested and found to be competent.  The left atrial  appendage was then oversewn using a running 4-0 Prolene.  The atrium was  irrigated with cold saline and atriotomy was closed in two layers using  running 4-0 Prolene.  Prior to tying the knot on the atriotomy air was  evacuated from the left side of the heart using some warm blood retrograde  cardioplegia as well as the usual deairing maneuvers using a ventilator and leaving  volume in from the bypass pump.  The cross clamp was then removed  and atriotomy was closed.  A second layer of closure over the atriotomy was  performed using a running 4-0 Prolene.  The patient was rewarmed and  reperfused.  The patient was cardioverted back to a regular rhythm.  The  patient was initially in sinus but then converted to an atrial flutter but  with a ventricular response rate controlled in the 80 to 90 range.  Temporary pacing wires were  applied and the cardioplegia cannulae were  removed.  The patient was warmed to 37 degrees and the ventilator was  resumed.  The patient was then weaned from bypass on low dose dopamine with  stable blood pressure and cardiac output.  Protamine was administered  without adverse reaction.  The cannulae were removed.  The mediastinum was  irrigated with warm antibiotic irrigation.  The superior pericardial fat was  closed over the aorta and right ventricle.  Two mediastinal chest tubes were  placed and brought out through separate incisions.  The sternum was closed  with interrupted steel wire.  The pectoralis fascia was closed with  interrupted #1 Vicryl.  The deep subcutaneous fat was closed with  interrupted  Vicryl.  The superficial and subcutaneous fat was closed with a running  Vicryl and the skin was closed with a subcuticular Vicryl.  Sterile  dressings were applied.  Total bypass time was 140 minutes with aortic cross  clamp time of 80 minutes.                                                [name], M.D.    PV/MEDQ  D:  07/13/2002  T:  07/17/2002  Job:  [id number]   cc:   [facility] Office   Traci R. Radford Pax, M.D.  [address], [place]  [place]  [place] [address]  Fax: [phone]

## 2011-04-04 NOTE — Assessment & Plan Note (Signed)
[facility]                           ELECTROPHYSIOLOGY OFFICE NOTE   NAME:[name], [name]                      MRN:          [id number]  DATE:10/06/2006                            DOB:          July 01, 1926    [name] returns today for complaints of shortness of breath and severe  fatigue.  The patient is a pleasant, obese middle-aged woman with a history  of symptomatic heart failure underwent biV pacemaker implantation by Dr.  Caryl Comes several weeks ago.  She also has atrial fibrillation which appears to  be chronic as she was on amiodarone.  Underwent cardioversion but apparently  reverted back to atrial fibrillation.  She has been very difficult to  control.  Her amiodarone was subsequently stopped.  There was consideration  for AV nodal ablation.  The patient states that she continues to feel weak  and fatigued and has dyspnea with exertion.  Her medications include  Coumadin, potassium, furosemide 80 twice a day, Diovan, Coreg.   On exam, she is a pleasant but ill-appearing woman who is in no acute  distress.  Blood pressure was 108/62, pulse 61 and irregular, respirations  were 18 and the weight was 243 pounds which is down 5 pounds from her visit  one month ago.  The neck revealed no obvious jugular venous distention.  Lungs revealed rales at the bases but otherwise clear.  There were no  wheezes or rhonchi.  The cardiovascular exam was distant with irregular  rhythm and normal S1-S2.  The extremities demonstrated trace peripheral  edema bilaterally but her exam was made much more difficult by her morbid  obesity.   Interrogation of her pacemaker demonstrates a Medtronic En-Sync III with fib  waves of 0.5 and R-waves which were not available.  She was over 95% paced.  Her threshold was a volt of 0.4 in the RV and a volt of 0.4 in the LV.  The  impedances were 387 in the atrium and 602 in the right ventricle and 773 in  the left  ventricle.   IMPRESSION:  1. Congestive heart failure.  2. Chronic atrial fibrillation.  3. 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   DISCUSSION:  It is difficult to know exactly what is going on with [name] based on her obesity and that she is a difficult historian.  I am not  sure whether she is volume overloaded or not, though she does not appear to  be.  I have recommended no medical changes for now.  Today, we will obtain a  BMP, BNP and a CBC with differential.  She will plan to see Dr. Caryl Comes back  in the office tomorrow.  Based on the results of her labs and her weights,  we can make additional recommendations for diuresis versus other medical  therapy.     Champ Mungo. Lovena Le, MD  Electronically Signed    GWT/MedQ  DD: 10/06/2006  DT: 10/07/2006  Job #: [id number]

## 2011-04-04 NOTE — Op Note (Signed)
[name], [name] NO.:  [id number]   MEDICAL RECORD NO.:  [id number]          PATIENT TYPE:  OIB   LOCATION:  2899                         FACILITY:  [facility]   PHYSICIAN:  Carlena Bjornstad, MD, FACCDATE OF BIRTH:  01/25/1926   DATE OF PROCEDURE:  DATE OF DISCHARGE:                                 OPERATIVE REPORT   CARDIOVERSION:  The patient has atrial fibrillation, and cardioversion was  arranged by Dr. Caryl Comes.  I am helping with the procedure, as Dr. Caryl Comes is  currently in another procedure.   The patient was prepped in the usual fashion and n.p.o.  Anesthesia was  present.  She received 120 mg of IV Pentothal.  Anterior-posterior pads were  attached with the biphasic defibrillator.  The pacemaker was interrogated.  It showed that she had no significant underlying rhythm.  It also showed her  atrial fib.   The patient received 120 joules of energy.  She converted, and she is now AV  pacing.   The patient is stable.  Followup will be with Dr. Caryl Comes.           ______________________________  Carlena Bjornstad, MD, [facility]     JDK/MEDQ  D:  09/09/2006  T:  09/10/2006  Job:  [id number]

## 2011-04-04 NOTE — Discharge Summary (Signed)
[name], [name]                          ACCOUNT NO.:  [id number]   MEDICAL RECORD NO.:  [id number]                   PATIENT TYPE:  INP   LOCATION:  2017                                 FACILITY:  [facility]   PHYSICIAN:  [name], M.D.                  DATE OF BIRTH:  1926-08-27   DATE OF ADMISSION:  09/16/2002  DATE OF DISCHARGE:  09/19/2002                                 DISCHARGE SUMMARY   PRIMARY CARE PHYSICIAN:  Hal T. Stoneking, M.D.   DISCHARGE DIAGNOSES:  1. Atrial fibrillation; electively admitted to the hospital for initiation     of amiodarone drug loading.  Tolerated well without excessive QTC     prolongation (at the time of discharge QTC OK at 500 milliseconds,     setting of ventricularly paced rhythm).  2. Systemic anticoagulation secondary to atrial fibrillation:  INR within     therapeutic range at least four weeks  prior to  admission and continued     in  therapeutic range during the course of admission. Protime of 2.3.  3. History of mitral regurgitation, status post mitral valve repair by Dr.     Prescott Gum I believe August of this year.   PLAN:  The patient was discharged to home in stable condition.   DISCHARGE MEDICATIONS:  1. Amiodarone 200 mg tablets 2 tablets p.o. b.i.d., new.  Tuesday September 19, 2002, up to and including Monday September 26, 2002, then decrease to 2     tabs p.o. q.d. Tuesday September 27, 2002, up to and including Monday     October 03, 2002, then  decrease to 1 tablet p.o. q.d. thereafter.  2. Coumadin 5 mg Monday, Wednesday and Friday with 1/2 tablet p.o. q.d.     otherwise, evenings.  3. Furosemide 60 mg p.o. q.d.  4. K-Dur 20 mEq 2 tablets p.o. q.d.   DISCHARGE INSTRUCTIONS:  Diet low salt.   FOLLOW UP:  To see Dr. [name] and also to see [name] in our pacemaker  clinic prior to  her leaving for her [place] trip on October 10, 2002. She  was to see the office nurse on Thursday, September 29, 2002, and will  also  have a protime/INR drawn in our Coumadin clinic at 2:45 p.m.   COMMENTS:  A form was filled out for a request for amiodarone drug  assistance program.   HISTORY OF PRESENT ILLNESS:  The patient is a very pleasant 77 year old  female with a history of atrial fibrillation, who was admitted electively  for initiation of amiodarone therapy towards eventual outpatient  cardioversion. She is  status post mitral valve repair by Dr. Tharon Aquas  [name] in August of this year. She also had a permanent  transvenous  pacemaker placed at that same time for brady arrhythmias.   The patient has been feeling somewhat short of  breath. It is hoped that a  conversion back to normal sinus rhythm will help this symptom.   HOSPITAL COURSE:  The patient had an uneventful hospital stay. She  maintained atrial fibrillation with controlled ventricular response in the  70s.  The plan is to interrogate the pacemaker in the office in  approximately two and a half weeks. If still in atrial fibrillation, then  outpatient direct current cardioversion. Baseline LFTs were obtained and  were within a good range. A TSH was in good range at 3.896. Baseline PFTs  were obtained as well; computer read revealed really small airway  obstruction with mild decrease in diffusion.   LABORATORY DATA:  Today:  WBCs 6.8, hemoglobin 10.9, hematocrit 34,  platelets 232. Differential within normal range. Admission protime 23.1, INR  2.4. At the time of discharge protime 22.6, INR 2.3. Sodium 142, potassium  3.9, chloride 107, CO2 27, glucose 114, BUN 12, creatinine 1.3.  LFTs within  the normal range, although albumin slightly decreased at 3.4. TSH within the  normal range at 3.896.   A chest x-ray revealed cardiomegaly, no active disease. Admission QTC 508  milliseconds; stable during the course of admission.   PAST MEDICAL HISTORY:  1. History of severe MR, status post mitral valve repair  by Dr. Prescott Gum,     August  2003.  2. History of atrial fibrillation, rate controlled, status post permanent     transvenous pacemaker for brady arrhythmias, August 2003.  3. Systemic  anticoagulation.  4. Hypertension well controlled.  5. Hyperlipidemia.  6. History of hypokalemia, improved/compensated with potassium supplements.  7. History of back pain.   PAST SURGICAL HISTORY:  1. Cholecystectomy.  2. Appendectomy.  3. Cataract removal.  4. Ulnar nerve surgery.  5. Tonsillectomy.     [name], N.P.                       [name], M.D.    MES/MEDQ  D:  09/22/2002  T:  09/23/2002  Job:  [id number]

## 2011-04-04 NOTE — Cardiovascular Report (Signed)
NAMEAVARAE, [name]               ACCOUNT NO.:  [id number]   MEDICAL RECORD NO.:  [id number]          PATIENT TYPE:  INP   LOCATION:  2027                         FACILITY:  [facility]   PHYSICIAN:  [name], M.D.     DATE OF BIRTH:  [birth year]   DATE OF PROCEDURE:  10/26/2006  DATE OF DISCHARGE:                            CARDIAC CATHETERIZATION   REFERRING PHYSICIAN:  Hal T. Stoneking, M.D.   PROCEDURE:  Is a right and left heart catheterization.   OPERATOR:  [name], M.D.   INDICATIONS:  Dyspnea as well as mitral valve disease status post mitral  valve repair.   OPERATOR:  [name], M.D.   COMPLICATIONS:  None.   IV ACCESS:  Via right femoral artery 6-French sheath and right femoral  vein 7-French sheath.   This is a very pleasant 77 year old white female with obesity and  history of mitral regurgitation status post mitral valve repair as well  as LV dysfunction secondary to MR and normal coronary arteries by cath  several years ago who now presents with persistent shortness of breath  felt probably secondary to atrial fibrillation.  She does have a  biventricular pacemaker and recently developed new onset of atrial  fibrillation and nausea and amiodarone was discontinued.  She now  presents for further evaluation for pulmonary hypertension.   The patient is brought to cardiac catheterization laboratory in a  fasting nonsedated state.  Informed consent was obtained.  The patient  was connected to continuous heart rate and pulse oximetry monitoring and  intermittent blood pressure monitoring.  The right groin was prepped and  draped in sterile fashion.  1% Xylocaine was used for local anesthesia.  Using modified Seldinger technique a 6-French sheath was placed in right  femoral artery.  Using modified Seldinger technique a 7-French sheath  was placed in the right femoral vein. Under fluoroscopic guidance a 7-  [place] Swan-Ganz catheter was placed under balloon  flotation into the  right atrium, right atrial pressure was measured and right atrial O2  saturations were obtained.  Catheter was then advanced into the right  ventricle and right ventricular pressure and O2 saturations were  obtained.  The catheter was then advanced into the pulmonary artery and  into the pulmonary capillary wedge position where pulmonary capillary  wedge pressure was obtained.  The balloon was then deflated, the  catheter pulled back into the main pulmonary artery and pulmonary artery  pressures and O2 saturations were obtained.  The catheter was then  removed under fluoroscopic guidance and then a 6-French pigtail catheter  was placed in the left ventricular cavity.  Left ventricular pressure  was measured.  Left ventriculography was not performed because of the  patient's renal function. The catheter was then pulled back across the  aortic valve with no significant gradient noted.  At the end of  procedure all catheters and sheaths were removed.  Manual compression  was performed so adequate hemostasis was obtained.  The patient was  transferred back to room in stable condition.   RESULTS:  1. The right atrial pressure is 19/19  with a mean of 17 mmHg.  RV      pressure 41/11 with a mean of 15 mmHg.  PA pressure 41/19 with a      mean of 30 mmHg.  Pulmonary capillary wedge 22/26 with a mean of 21      mmHg.  LV pressure 134/10 mmHg, LVEDP 16-18 mmHg, aortic pressure      135/66 mmHg. O2 saturations; RA 59%, RV 60%, PA 63% and AO 93%.      Cardiac output by Fick 4.0, by thermodilution 5.3. Cardiac index by      Fick 1.9, cardiac index by thermodilution 2.5.   ASSESSMENT:  1. Dyspnea.  2. Mildly elevated right heart pressures most likely secondary to      elevated LVEDP with diastolic dysfunction.  3. Mild LV dysfunction by echo.  I suspect shortness of breath is      multifactorial secondary to obesity, deconditioning, diastolic      dysfunction, atrial  fibrillation with loss of atrial kick.   PLAN:  Restart Coreg 6.25 mg b.i.d. for diastolic dysfunction to  decrease heart rate and promote diastolic filling time. Restart Lasix 20  mg a day.  We will discuss rhythm management with Dr. Caryl Comes and most  likely discharge home today.      [name], M.D.  Electronically Signed     TT/MEDQ  D:  10/26/2006  T:  10/26/2006  Job:  [id number]   cc:   Hal T. Stoneking, M.D.  Deboraha Sprang, MD, [facility]

## 2011-04-04 NOTE — Discharge Summary (Signed)
NAMESUE-ANN, [name]               ACCOUNT NO.:  [id number]   MEDICAL RECORD NO.:  [id number]          PATIENT TYPE:  INP   LOCATION:  2027                         FACILITY:  [facility]   PHYSICIAN:  [name], M.D.     DATE OF BIRTH:  1926/10/13   DATE OF ADMISSION:  10/23/2006  DATE OF DISCHARGE:  10/26/2006                               DISCHARGE SUMMARY   ADMISSION DIAGNOSIS:  Dyspnea on exertion.   DISCHARGE DIAGNOSES:  1. Dyspnea on exertion, stable.  2. Renal insufficiency with an increased creatinine of 1.5, stable.  3. Mildly elevated right heart pressures, secondary to increased left      ventricular end-diastolic pressure with diastolic dysfunction.  4. Mild capsule left ventricular dysfunction by echocardiogram.  5. Status post Bi-V permanent pacemaker.   PROCEDURES:  1. Right heart cardiac catheterization October 26, 2006.  2. VQ scan October 24, 2006; there was a nonsegmental defect from the      heart and very small rat-like perfusion defects in the upper lobes.      Based on modified PIOPED criteria, the chance of pulmonary      thromboembolism is low.  3. Ultrasound of the abdomen October 24, 2006, status post      cholecystectomy.  No acute findings.   HOSPITAL COURSE:  [name] is a 77 year old female with a prior  history of normal coronaries via cardiac catheterization in June 2003.  She was admitted to the [facility] on October 23, 2006 with a  chief complaint of dyspnea on exertion.  She also has a history of  atrial fibrillation.  However, was maintaining sinus rhythm with V-  pacing during this admission.  The patient is on systemic  anticoagulation with Coumadin.  BNP was mildly elevated; however,  nondiagnostic for CHF.  D-dimer was negative at 0.28.  VQ scan revealed  low probability for PE.  Two-view chest x-ray revealed cardiomegaly  without acute disease.   During this admission, the patient had renal insufficiency with an  increased creatinine of 2.3.  This was likely secondary to diuresis.  [name] Lasix was placed on hold and her creatinine decreased to  1.5 prior to discharge.   She was scheduled for right heart catheterization on October 26, 2006,  which was performed by Dr. Radford Pax.  This revealed mildly elevated right  heart pressures, secondary to increased LV EDP with diastolic  dysfunction.  A 2-D echocardiogram revealed mild LV dysfunction.  Prior  to right heart catheterization the patient's Coumadin, Diovan, Lasix,  potassium, and Coreg were all placed on hold.  All medications were  restarted prior to discharge with a change in dosage, with the exception  of Diovan (which was discontinued).   The patient was discharged to home in stable condition on October 26, 2006, after completing bed rest following her right heart  catheterization.   LABORATORY DATA:  PT 18.3, INR 1.5, BNP 190.  Sodium 140, potassium 3.5,  chloride 111, CO2 23, glucose 98, BUN 20, creatinine 1.5.  Digoxin level  0.4.  TSH 2.912.  AST 36,  ALT 28, ALP 65.  Total bilirubin 1.4.  Serial  cardiac enzymes:  CK Total 70, 58, and 54.  CK-MB 1.9, 1.9, and 1.9.  Troponin I was 0.04, 0.05, and 0.04 respectively.  White blood count  5.8, hemoglobin 11.5, hematocrit 34.9, platelets 158.  D-dimer 0.28.   X-RAYS:  Two-view chest x-ray revealed cardiomegaly without acute  disease on October 01, 2006.  Ultrasound of the abdomen on October 24, 2006 revealed status post cholecystectomy.  No acute findings.   EKG:  EKG October 23, 2006 revealed electronic ventricular pacemaker  with a ventricular rate of 69 beats per minute.  No evidence of  ischemia.   CONDITION UPON DISCHARGE:  [name] was discharged to home today in  stable condition following her right heart catheterization.  Bed rest  was completed.  Her groin was stable without evidence of bleeding,  oozing, ecchymosis, or hematoma.  She was discharged without  complaint  of angina, nausea, vomiting, diaphoresis or dizziness.  She still had  mild complaints of dyspnea on exertion, but was stable.   DISCHARGE MEDICATIONS:  1. Coreg 6.25 mg twice daily.  This represents a decrease in dosage. A      new prescription was given with refills.  2. Lasix 20 mg daily.  This represents a decrease in dosage; a new      prescription was given with refills.  3. K-Dur 10 mEq.  This represents a decrease in dosage; a prescription      was given with refills.  4. Digoxin 0.125 mg 1/2 tablet daily.  5. Coumadin take as directed.   DISCHARGE INSTRUCTIONS:  1. Continue a heart-healthy diet, including low-salt, low-fat, and low      cholesterol.  2. No lifting greater than 10 pounds for 1 week.  3. No driving for 1 day.  4. Cleanse the groin area with warm soap and water.  Do not scrub the      groin area.  5. Call MD's office with recurrent palpitations, shortness of breath,      or dizziness.  6. STOP Diovan 80 mg.   FOLLOW-UP ARRANGEMENTS:  An appointment has been scheduled with Dr.  Golden Hurter, as well as the [facility] cardiology Coumadin clinic on  November 02, 2006 at 2:05 p.m..  The patient is scheduled to call  [id number]      Raiford Simmonds, PA      [name], M.D.  Electronically Signed    RDM/MEDQ  D:  10/26/2006  T:  10/26/2006  Job:  [id number]   cc:   Deboraha Sprang, MD, [facility]

## 2011-04-04 NOTE — Assessment & Plan Note (Signed)
[facility] OFFICE NOTE   BLAKELI, SAMPLEY                       MRN:          [id number]  DATE:09/02/2006                            DOB:          10/11/1926    Ms. Einstein was seen at the request of Dr. Radford Pax on October 17th.  Unfortunately, the dictation has been lost, and this is a brief summary from  my note.   Ms. Helson is a 77 year old lady with a history of atrial fibrillation  dating back about 10 years, who was cardioverted most recently a couple of  years ago.  She has had problems with episodic bloating, swelling, fatigue,  and shortness of breath, and there has been some concern about whether this  relates to the atrial fibrillation.  She has been on amiodarone for the  same.   She also has a history of mitral valve repair for mitral regurgitation in  2003, and her ejection fraction about a year ago was noted to be about 40%.  She has a history of heart block and is status post pacemaker implantation.  She also has symptoms consistent with obstructive sleep apnea and congestive  heart failure with pulmonary edema.   Her past medical history is notable for hypertension.  She also has  peripheral edema.   Thromboembolic risk factors are notable for hypertension, age, and  congestive heart failure.   Past surgical history is notable for a cholecystectomy, appendectomy, and  cataract surgery.   REVIEW OF SYSTEMS:  In addition to the above is notable for:  1. GE reflux disease.  2. Arthritis.  3. Fatigue.  4. Anemia.  5. Allergies.   It is otherwise negative across multiple organ systems.   SOCIAL HISTORY:  She is single.  She has four children.  She is retired.  She does not use cigarettes, alcohol, or recreational drugs.   MEDICATIONS:  1. Coreg 12.5.  2. Furosemide 80 alternating with 120.  3. Potassium 10 alternating with 20.  4. Amiodarone 200 b.i.d.  5. Diovan 80.  6.  Coumadin.   PHYSICAL EXAMINATION:  VITAL SIGNS:  Her blood pressure is 120/70, pulse 63.  Her weight was 248.  Her heart rhythm was irregular.  HEENT:  No __________.  NECK:  I do not recall.  LUNGS:  Clear.  HEART:  Heart sounds were regular.  I do not recall if there was a murmur.  ABDOMEN:  Soft with active bowel sounds.  EXTREMITIES:  Femoral pulses were 2+.  Distal pulses were intact.  I do not  recall the status of her edema.   Electrocardiogram dated October 17th demonstrated atrial fibrillation with  underlying ventricular pacing.  Interrogation of her device demonstrated  persistent atrial fibrillation since her last interrogation.   Faxed records from March, 2007 demonstrated that she was in atrial  fibrillation about 25-30% of the time prior to that evaluation and  interrogation in September, 2007 demonstrated that she was in atrial  fibrillation about 50-60% of the time.   IMPRESSION:  1. Atrial fibrillation, persistent.  2. Amiodarone  therapy for #1.  3. Left ventricular dysfunction, status post mitral valve repair.  4. Congestive heart failure.  5. Prior pacemaker implantation.   Traci, there are a variety of issues, and unfortunately, I am going to have  to summarize them as best as I can off the top of my head.  The first is  whether she could be maintained in sinus rhythm and whether it is worth a  repeat cardioversion.  In the event that was unsuccessful, consideration  could be given to discontinuing the amiodarone because of its toxicity and  consideration of upgrading her device to a CRT device.  At that time,  consideration could be given to ICD implantation.   I will plan to clarify this further when she comes in for a cardioversion  tomorrow and get back up in touch with you.  Thank you for the consultation.  I apologize for the discombobulated note.    ______________________________  Deboraha Sprang, MD, [facility]    SCK/MedQ  DD: 09/08/2006  DT:  09/08/2006  Job #: [id number]

## 2011-04-04 NOTE — Discharge Summary (Signed)
NAMETHAINA, LICANO               ACCOUNT NO.:  [id number]   MEDICAL RECORD NO.:  [id number]          PATIENT TYPE:  INP   LOCATION:  3736                         FACILITY:  [facility]   PHYSICIAN:  Sueanne Margarita, PA   DATE OF BIRTH:  03/22/1926   DATE OF ADMISSION:  09/30/2006  DATE OF DISCHARGE:  10/01/2006                                 DISCHARGE SUMMARY   ALLERGIES:  No known drug allergies.   PRINCIPAL DIAGNOSIS:  1. Discharging day #1 status post upgrade of pacemaker to a BiV pacemaker      with implantation of an InSync III 8042 pacemaker/CRT.  2. Atrial fibrillation, has failed DCV on amiodarone.  Currently not on      Coumadin, anticipating steroid injection, which the patient will      probably actually cancel because it was scheduled for October 05, 2006, and the patient will be unable to move her arm appropriately.  3. Amiodarone has been discontinued since it was not effective in it's      antiarrhythmic intention.   SECONDARY DIAGNOSES:  1. Status post mitral valve annuloplasty 2003.  2. Ejection fraction at last check was 40%.  3. Hypertension.  4. Second-degree heart block status post pacemaker.  5. CHF class III, chronic systolic.  6. Status post cholecystectomy, appendectomy, cataract surgery.   PROCEDURE:  Explant of existing KDR 701 pacemaker and implantation of  Medtronic InSync III CRT-P, Dr. Virl Axe.  The patient was aggressively  diuresed after the procedure.  She was found to be hypokalemic with  potassium 3.2 on admission.  Her potassium on postprocedure day #1 was 3.  Potassium was aggressively replenished as well.  A repeat basic metabolic  panel was taken at 2 o'clock on postprocedure day #1, and the patient will  be discharged if her potassium is in normal range.  Otherwise, additional  potassium will be given and the patient will stay one additional night.  The  patient also had some nausea with her lunch.  This will be remediated.   She  says she sometimes has gastric upset with nausea at home.  The patient is,  on the day of discharge, achieving 95% oxygen saturation on room air.   LABORATORY DATA:  Her laboratory studies as follows:  White count is 4.2,  hemoglobin 10.7, hematocrit 31.7, platelets 134.  Serum electrolytes on  postprocedure day #1:  Sodium 144, potassium is 3, chloride is 108,  carbonate 26, BUN is 15, creatinine 1.3, glucose is 101.  Her INR is 1.8.  She is asked not to lift anything heavier than 10 pounds for the next 4  weeks and not to drive for the next week.  She is to keep her incision dry  for next 7 days and sponge bathe until Wednesday, November 21.   DISCHARGE MEDICATIONS:  Her medications have been change somewhat they are  as follows.  1. Lasix 80 mg p.o. b.i.d. this is a new dose.  2. Potassium chloride 20 mEq 2 tablets daily.  This is a new dose.  3. Coreg  12.5 mg twice daily.  4. Digoxin 0.062 mg daily.  5. Diovan 80 mg daily.  6. She will go home on Keflex 500 mg four times daily 1/2 hour before      meals and at bedtime.   FOLLOWUP:  She has follow-[facility] with [facility] November 26, [address]  See Dr. Caryl Comes Wednesday, November 21 at 3, and the [facility] Wednesday, November 28 at 10:40.  Once again, a follow up basic  metabolic panel will be taken at 2 o'clock on postprocedure day #1, and  plans for either discharging the patient or keeping her will be made at that  time,.      Sueanne Margarita, PA     GM/MEDQ  D:  10/01/2006  T:  10/01/2006  Job:  [id number]   cc:   Hal T. Stoneking, M.D.  [name], M.D.  Deboraha Sprang, MD, [facility]

## 2011-04-04 NOTE — Op Note (Signed)
NAMETIFFANIE, WENTLING               ACCOUNT NO.:  [id number]   MEDICAL RECORD NO.:  [id number]          PATIENT TYPE:  INP   LOCATION:  3736                         FACILITY:  [facility]   PHYSICIAN:  Deboraha Sprang, MD, FACCDATE OF BIRTH:  Mar 30, 1926   DATE OF PROCEDURE:  DATE OF DISCHARGE:                                 OPERATIVE REPORT   PREOPERATIVE DIAGNOSES:  Congestive heart failure, atrial fibrillation -  paroxysmal probably now persistent with RV co-pacing, moderately depressed  LV function, congestive heart failure.   POSTOPERATIVE DIAGNOSES:  Congestive heart failure, atrial fibrillation -  paroxysmal probably now persistent with RV co-pacing, moderately depressed  LV function, congestive heart failure.   PROCEDURE:  Explantation of a previously implanted device.  Left upper  extremity contrast venogram, insertion of a left ventricle lead, revision of  the pocket, and replacement of a device.  __________ obtaining informed  consent, the patient was brought to electrophysiology laboratory and placed  on the fluoroscopic table in the supine position.  After routine prepping  and draping of the upper chest, Lidocaine was infiltrated in the prepectal  subclavicular region along the very medial aspect of the patient's prior  incision which I found to be somewhat lateral related to the venous access  sites that  I was persuing.  We had done a contrast venogram demonstrating  the patency of the extrathoracic left subclavian vein.   Venous access was obtained without difficulty.  A 9-1/2 French sheath was  placed and through this was passed a coronary sinus Medtronic MB2  cannulation catheter, which allowed for rapid cannulation of the coronary  sinus.  It turned out to be a valve, which we were able to bypass without  too much difficulty.  A contrast venogram of the coronary sinus venous  system demonstrated a very prominent lateral branch, a high anterior branch,  and a bail-out  posterior branch.   Obviously, the target was the mid branch and we placed a wire into this and  then used a Medtronic 4194 lead, serial number [id number].  Unfortunately,  across the entire span of this vein there had no capture or diaphragmatic  capture.   We replaced this lead with a St. Jude 1086 lead and found better capture,  more diaphragmatic capture, and the one place we had neither, the lead was  probably too proximal in the corner with the proximal portion of the coil in  the body of the coronary sinus and I thought that this lead position was  unstable.   At this point we elected to explore the anterior vein to see if we could  find any lateral ramifications.  We found good pacing, but the lead was  really very anterior and __________ coming down very medial to the body of  the coronary sinus.  Lateral ramifications were identified, however, they  were quite apical and placed the lead right on top of the previously  implanted RV lead.  All this took a couple of hours and we then decided to pursue the bail-out  branch, which we were able to access  by using a double Wholey wire  technique.  We then used an attained straight sheath to allow for deep  throating of the MB2 into this vein branch and we then explore this vein  branch and found a lateral ramification which had fairly good spacing from  the RV apical lead in both a very steep LAO and the RAO projection.  This  position was selected.  The RV amplitude, which was a paced amplitude, was  20 with a pacing impedance of 1120 over the threshold, 2 volts of 0.5  milliseconds, current at threshold is 2.2 at mean.  There is no  diaphragmatic pacing at 10 volts.  It was at this point that the 9-1/2  [place] sheath was removed and the pacemaker pocket previously left intact  was opened. The previously implanted pacemaker was carefully excised from  the scar tissue.  The previously implanted RV lead was of Medtronic 5076,   serial number [id number] and the atrial lead was a 5076 serial number  [id number].  Defibrillation wave was 1 millivolt with a pacing impedance of  353 and the RV lead demonstrated no intrinsic ventricular rhythm other than  pacing impedance of 557 at threshold of 1 volt at 1.4 milliseconds.  The  pocket had to be expanded to allow housing for the larger Medtronic 8042  pulse generator to which the leads were attached with the serial number  [id number].  Mode switching was identified; a change in morphology was  identified. The pocket was copiously irrigated with antibiotic-containing  saline solution.  SurgiSeal was placed along the medial and superior aspects  of the pocket.  The wound was then closed in 3 layers in normal fashion.  I  should note that a suture was placed in the cephalad portion of the pocket  anchoring the can but also holding the leads posterior to the can.  The  wound was closed as noted and the wound was then washed, dried, and a  benzoin and Steri-Strip dressing was applied.  Needle counts, sponge counts  and instrument counts were correct at the procedure according to the staff.  The patient tolerated the procedure without apparent complications.      Deboraha Sprang, MD, [facility]  Electronically Signed     SCK/MEDQ  D:  09/30/2006  T:  10/01/2006  Job:  [id number]   cc:   Electrophysiology laboratory

## 2011-04-04 NOTE — H&P (Signed)
[name], [name]                          ACCOUNT NO.:  [id number]   MEDICAL RECORD NO.:  [id number]                   PATIENT TYPE:  INP   LOCATION:  2017                                 FACILITY:  [facility]   PHYSICIAN:  [name], M.D.                  DATE OF BIRTH:  [birth year]   DATE OF ADMISSION:  09/16/2002  DATE OF DISCHARGE:  09/19/2002                                HISTORY & PHYSICAL   ADMISSION DIAGNOSES:  (As dictated by [name], M.D.)  1. Elective admission for initiation of amiodarone drug load for history of     atrial fibrillation, currently rate controlled status post permanent     transvenous pacemaker placement for tachycardia-bradycardia syndrome.  2. History of severe mitral valve regurgitation status post mitral valve     repair 12/2001, Dr. Prescott Gum.  3. History of tachycardia-bradycardia syndrome status post permanent     transvenous pacemaker in 2003.  4. Systemic anticoagulation secondary to atrial fibrillation; INR has been     therapeutic (management in our Coumadin clinic for at least four weeks     prior to this admission).  INR was within good range on the day of     admission at 2.4.  5. History of hypertension.   PLAN:  (As dictated by [name], M.D.)  1. Admit to telemetry, initiating amiodarone 400 mg p.o. t.i.d.  Will obtain     baseline PFTs, TSH, and LFTs.  QTC has been within acceptable range with     508 msec ventricular paced rhythm.  2. Serum calcium within good range at 3.9.   HISTORY OF PRESENT ILLNESS:  The patient is status post open heart surgery  for mitral valve repair.  Cardiac catheterization prior to mitral valve  repair revealed normal coronary arteries, moderate to severe MR, moderate  pulmonary hypertension, and mild LV dysfunction.  She also had a permanent  transvenous pacemaker placed at time of surgery for bradyarrhythmias.   The patient is continuing shortness of breath felt related to her atrial  fibrillation. She is on chronic systemic anticoagulation.  She is within  good therapeutic range for four weeks and is now admitted for elective  amiodarone drug loading.   PAST MEDICAL HISTORY:  1. Atrial fibrillation.  (01/2002) Permanent transvenous pacemaker placed     secondary to bradyarrhythmia syndrome.  2. Systemic anticoagulation.  3. Hypertension under good control on Hytrin.  4. History of hyperlipidemia.  5. History of hypokalemia on supplements.  6. History of back pain.   PAST SURGICAL HISTORY:  1. Mitral valve repair and placement of transvenous permanent pacemaker     earlier this year, 2003.  Catheterization prior to procedure revealed     normal coronary arteries.  Believe surgery was in August of this year.  2. History of cholecystectomy.  3. Appendectomy.  4. Cataract removal x 1.  5. Ulnar neurosurgery  2002.   ALLERGIES:  No known drug allergies   MEDICATIONS:  1. Coumadin 5 mg alternating with 2.5 mg per day.  2. Lasix 60 mg p.o. q.d.  3. CK-Dur 40 mEq p.o. q.d.   SOCIAL HISTORY:  Divorced since 1973, four children. She is retired from  hospital work.  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  Tobacco/ETOH: Negative.   FAMILY HISTORY:  Father died at age 43 with pancreatic cancer.  Mother died  at age 44 and had no chronic medical problems.  Two sisters and a brother  are all in good health.  There are four children, two daughters and two  sons, in good health.   REVIEW OF SYSTEMS:  As in HPI and Past Medical History.  The patient  complains of a cough for the last couple of weeks.  Wears glasses.  No  dentures.  Denies melena or bright red blood p.r.  Negative dysuria and  hematuria. Denies complaints.   PRIMARY CARE PHYSICIAN:  Hal T. Stoneking, M.D.   PHYSICAL EXAMINATION:  (As performed by [name], M.D.)  VITAL SIGNS:  Blood pressure 109/62, heart rate 74 and regular, temperature  99.2.  She is 5 feet 4  inches and weighs 238.1 pounds.  GENERAL:  Well-nourished, pleasantly conversant 77 year old female in no  acute distress.  Alert.  NECK:  Brisk carotid upstrokes without bruit.  No JVD, no thyromegaly.  CHEST:  Sounds clear.  Negative CVA tenderness.  Well-healed sternal  incision and well-healed incision at site of pacemaker replacement.  CARDIAC:  Regular rate and rhythm without appreciable murmur, rub, or  gallop.  ABDOMEN:  Soft, nondistended.  Normoactive bowel sounds.  Negative abdominal  aortic, renal, or femoral bruit.  Nontender to applied pressure.  EXTREMITIES:  Distal pulses intact.  Negative pedal edema.  GENITAL/RECTAL:  Exam deferred.  NEUROLOGIC:  Grossly nonfocal.  Alert and oriented x 3.   LABORATORY DATA:  Sodium 142, potassium 3.9, chloride 107, CO2 27, BUN 21,  creatinine 1.3, glucose 114.  Hemoglobin 10.9, hematocrit 34, WBC 6.8,  platelets 232.  LFTs within normal range.  Pro time 23.1, INR 2.4.  TSH is  pending.   EKG revealed paced rhythm with QTC prolonged at 508 msec. This was in the  setting of ventricular paced rhythm.     [name], N.P.                       [name], M.D.    MES/MEDQ  D:  09/22/2002  T:  09/22/2002  Job:  [id number]

## 2011-04-04 NOTE — Op Note (Signed)
[name], [name]                ACCOUNT NO.:  [id number]   MEDICAL RECORD NO.:  [id number]          PATIENT TYPE:  AMB   LOCATION:  SDS                          FACILITY:  [facility]   PHYSICIAN:  [name], M.D.     DATE OF BIRTH:  Jul 14, 1926   DATE OF PROCEDURE:  07/15/2005  DATE OF DISCHARGE:                                 OPERATIVE REPORT   REFERRING PHYSICIAN:  Hal T. Stoneking, M.D.   PROCEDURE:  Direct current cardioversion.   OPERATOR:  [name], M.D.   INDICATIONS FOR PROCEDURE:  Atrial flutter and congestive heart failure.   COMPLICATIONS:  None.   INTRAVENOUS MEDICATIONS:  Pentothal 175 mg IV.   This is a very pleasant 77 year old white female with a history of  paroxysmal atrial fibrillation currently on Amiodarone.  She has a history  of tachy-brady syndrome and is status post permanent pacemaker.  She also  has a history of borderline hypertension.  She has recently had some bouts  of congestive heart failure which was felt to be multifactorial secondary to  dietary indiscretion as well as recurrent atrial flutter, and she now  presents for direct current cardioversion with therapeutic INR of greater  than four weeks.   The patient was brought to the day hospital in a fasting nonsedated state.  Informed consent was obtained.  The patient was connected to continuous  heart rate and pulse oximetry monitoring and intermittent blood pressure  monitoring.  Defibrillation pads were placed on the anterior and posterior  left chest and after adequate anesthesia was obtained, a synchronized 75  joule shock was delivered which successfully converted the patient to AV  sequential pacing.  The patient tolerated the procedure well without  complications and was later discharged.   ASSESSMENT:  1.  Atrial flutter.  2.  Permanent pacemaker.  3.  Congestive heart failure secondary to number 1.  4.  Successful cardioversion to normal sinus rhythm with V-pacing.   PLAN:  1.  Discharge to home after fully awake.  2.  Follow up with me in two weeks.  3.  Continue current medications.  4.  In regards to her Coumadin, she is going to follow up in Coumadin clinic      on Friday, September 1.      [name], M.D.  Electronically Signed     TT/MEDQ  D:  07/15/2005  T:  07/15/2005  Job:  [id number]   cc:   Hal T. Stoneking, M.D.  [address] [place], [place] [address]  Fax: [phone]

## 2011-04-04 NOTE — Letter (Signed)
September 17, 2006     [name], M.D.  [address] [place], [place] [address]   RE:  [name], [name]  MRN:  [id number]  /  DOB:  [birth year]   [name] is seen today.  She had her blood pressure checked, and it was  low, so she came over to see what was going on.  Her device was  interrogated.  She had reverted atrial fibrillation, told cardioversion last  week.   As you recall, we had talked about treatment options for her atrial  fibrillation and her heart failure.  Clearly, maintenance of sinus rhythm is  going to be very difficult, and certainly not going to be accomplished with  Amiodarone.  We will thus discontinue the amiodarone.   As she has had reaccumulation of fluid in the last couple of days,  I think  more aggressive treatment for her heart failure is indicated.  This will  also be complicated by her renal insufficiency.  Creatinine, as you know, is  1.8.   We have discussed treatment options including CRT upgrade with or with a  defibrillator, depending on what her ejection fraction is.  I have scheduled  her to get an echo in your office next Tuesday, and we have tentatively  scheduled her for CRT upgrade on Wednesday November 14.  Again, the device  would depend on her ejection fraction.   In the interim, I suggested that she:  A.  Continue her Lasix.  B.  Take and extra Lasix 80 if needed.  C.  Decrease her fluid intake from the greater than 64 ounces that she is  doing daily.  D.  Continue to exclude salt from her diet.  E.  We will begin her on Lanoxin at 0.0625.  F.  I have anticipated that her INR level will decrease as her Amiodarone  washes out.  G.  Will anticipate that she undergo her steroid injection of her back on  November 16.  So, we will plan to discontinue her Coumadin in anticipation  of the CRT upgrade, realizing that if she were to revert to sinus rhythm  during the procedure, we would have to put her back in atrial  fibrillation  with consideration of reattempts to maintain sinus rhythm to be accomplished  sometime later.   Traci, thanks again for asking [place] to see her.   I should note, today on her exam, her blood pressure was 106/59, pulse 84.  Lungs were clear.  Neck veins were 7 cm.  Heart sounds were regular.  Extremities had 2+ edema.    Sincerely,     ______________________________  Sherry Sprang, MD, [facility]    SCK/MedQ  DD: 09/17/2006  DT: 09/17/2006  Job #: [id number]   CC:    Hal T. Stoneking, M.D.

## 2011-04-04 NOTE — Op Note (Signed)
[name], [name]                          ACCOUNT NO.:  [id number]   MEDICAL RECORD NO.:  [id number]                   PATIENT TYPE:  INP   LOCATION:  2303                                 FACILITY:  [facility]   PHYSICIAN:  Finis Bud, M.D.              DATE OF BIRTH:  05-15-1926   DATE OF PROCEDURE:  DATE OF DISCHARGE:                                 OPERATIVE REPORT   PROCEDURE:  Transesophageal echocardiography.   ANESTHESIOLOGIST:  Finis Bud, M.D.   INDICATION:  The patient is a patient of Dr. [name], who presents  today for mitral valve repair.  We plan to place transesophageal  echocardiogram probe for evaluation of cardiac structure and function.   DESCRIPTION OF PROCEDURE:  The patient was brought to the holding area the  morning of surgery where radial artery and pulmonary artery lines were  placed under local anesthesia with sedation.  The patient was then taken to  the operating room for routine induction of anesthesia.  The trachea was  intubated.  Following intubation of the trachea, the TEE probe was passed  oropharyngeally into the stomach and then withdrawn for imaging of the  cardiac structures.   PRE-CARDIOPULMONARY BYPASS TRANSESOPHAGEAL ECHOCARDIOGRAM:  Left ventricle:  There was mildly decreased contractility, 40-45%.  The papillary muscles  were well-outlined.  There were no masses noted within the left ventricular  chamber.   Mitral valve:  There was some calcification.  There was no obvious flailing  of the segments.  On Doppler examination, there was 3 to 4+ mitral  regurgitant flow noted.  There was no reversal of flow on pulse wave  examination.   Left atrium:  Left atrium was dilated.  The intra-atrial septum was intact.  The atrial appendage was clear of any masses.   Aortic valve:  The aortic valve was tricuspid in nature.  There was no  obvious regurgitant flow on Doppler examination.   Right ventricle and tricuspid  valve:  The right ventricle and tricuspid  valve were somewhat difficult to visualize, however, grossly within normal  limits.   The patient was placed on cardiopulmonary bypass.  A #28 mitral ring was  placed.  Subsequently, de-airing maneuvers were carried out.  The patient  was rewarmed and separated from cardiopulmonary bypass with the initial  attempt.   POST-CARDIOPULMONARY BYPASS TRANSESOPHAGEAL ECHOCARDIOGRAM:  In the early  bypass period, the chamber revealed low volume, however, with replacement of  adequate volume, the contractile pattern improved.  There were no  significant changes from pre-cardiopulmonary bypass examination.   Mitral valve:  The ring appeared to be seated well.  There did not appear to  be any obstruction to flow.  There was no significant mitral regurgitant  flow noted.   The rest of the cardiac exam was as previously described.  The patient was  returned to the cardiac intensive care in stable condition.  Finis Bud, M.D.    CE/MEDQ  D:  07/13/2002  T:  07/15/2002  Job:  [id number]

## 2011-04-04 NOTE — Op Note (Signed)
   [name], [name]                          ACCOUNT NO.:  [id number]   MEDICAL RECORD NO.:  [id number]                   PATIENT TYPE:  OIB   LOCATION:  2875                                 FACILITY:  [facility]   PHYSICIAN:  [name], M.D.                  DATE OF BIRTH:  October 05, 1926   DATE OF PROCEDURE:  10/03/2002  DATE OF DISCHARGE:                                 OPERATIVE REPORT   PROCEDURE PERFORMED:  Direct current cardioversion.   OPERATOR:  [name], M.D.   INDICATIONS FOR PROCEDURE:  Atrial fibrillation.   COMPLICATIONS:  None.   IV MEDICINES:  Pentothal 250 mg.   HISTORY:  The patient is a 77 year old white female with a history of atrial  fibrillation, also with mitral valve regurgitation, who is status post  mitral valve ring anuloplasty with Dr. [name].  During her hospital  stay, she also received a permanent pacemaker for tachy-brady syndrome.  She  has been on Coumadin with a therapeutic INR of greater than 2 for at least  four weeks.  She was also loaded with amiodarone several weeks ago.  She now  presents for DC cardioversion.   DESCRIPTION OF PROCEDURE:  The patient was brought to the day hospital in a  fasting nonsedated state.  Informed consent was obtained.  Continuous heart  rate and blood pressure monitoring was performed.  After informed consent  was obtained, the patient was sedated by anesthesia.  After adequate  anesthesia was obtained, a synchronized 150 joule shock was delivered which  was unsuccessful in converting the patient.  A 200 joule shock was also  unsuccessful.  A 300 joule synchronized shock successfully converted the  patient to AV paced rhythm.  The patient tolerated the procedure well.  The  pacemaker was then interrogated and the base rate was turned to 50 beats per  minute.   ASSESSMENT:  1. Atrial fibrillation on amiodarone and Coumadin with an INR of 2.1.  2. Status post mitral valve ring anuloplasty.  3.  Successful DC cardioversion to AV packing.    PLAN:  1. Discharge to home when awake and ambulating.  2. Decrease pacer rate to 60 beats per minute.  3. Follow up with Dr. Radford Pax in two months.                                               [name], M.D.    TT/MEDQ  D:  10/03/2002  T:  10/03/2002  Job:  [id number]   cc:   Hal T. Stoneking, M.D.  [address] [place], [place] [address]  Fax: [phone]   Len Childs, M.D.  [address]  [place]  [place] [address]  Fax: [phone]

## 2011-04-04 NOTE — Discharge Summary (Signed)
Sherry, [name]               ACCOUNT NO.:  [id number]   MEDICAL RECORD NO.:  [id number]          PATIENT TYPE:  INP   LOCATION:  2019                         FACILITY:  [facility]   PHYSICIAN:  [name], M.D.     DATE OF BIRTH:  11/20/1925   DATE OF ADMISSION:  09/06/2007  DATE OF DISCHARGE:  09/10/2007                               DISCHARGE SUMMARY   DISCHARGE DIAGNOSES:  1. Atrial fibrillation status post cardioversion while on Tikosyn      therapy.  2. Nonsustained ventricular tachycardia, isolated event.  3. Hypokalemia, treated.  4. Diastolic dysfunction documented on cardiac catheterization (right      heart) on October 2007.  5. Renal insufficiency with baseline creatinine of 1.5.  6. Depressed ejection fraction of 45% to 50% on  Coumadin November 2007 with a mitral valve area 1.83 cm2.  1. Severe mitral regurgitation status post mitral valve repair 2003 by      Dr. [name].  2. Systemic anticoagulation with Coumadin.  3. Hypertension.   HOSPITAL COURSE:  [name] is a 77 year old female with a known  history of chronic atrial fibrillation as well as tachybrady syndrome.  He has previously undergone pacemaker implantation.  She is on Coumadin  therapy.  She has been therapeutic for several weeks and is now being  admitted for Tikosyn loading.  While on Tikosyn loading she had a run of  NSVT and at that point we consulted Dr. Cristopher Peru, the  electrophysiology service to see this patient.  In general, he feels the  patient would benefit from being on Tikosyn therapy, although it would  be a low dose.  He did recommend that the patient undergo cardioversion  as well.  On September 09, 2007, the patient underwent cardioversion,  restoring normal sinus rhythm.  His recommendation was to remain on  Tikosyn therapy at a low dose.   LABORATORY DATA:  During the patient's hospital stay include:  PT 25.1,  INR 2.2, sodium 140, potassium 4.3, BUN 17, creatinine  1.31.  GFR 39,  TSH 4.266, digoxin level less than 0.2.   EKG shows ventricular pacing at a rate of 73.   DISCHARGE MEDICATIONS:  1. Lasix 20 mg daily.  2. Potassium 20 mEq daily.  3. Coreg 6.25 mg twice a day.  4. Digoxin 0.125 mg one half tablet daily.  5. Fosamax weekly.  6. Coumadin as prior to admission.  7. Tikosyn 125 mcg twice a day.   The patient is to follow with an EKG on September 17, 2007, at 2 p.m. and  then followup with Dr. Radford Pax on September 23, 2007 at 10 a.m.  The  patient is to remain on low-sodium diet.  Increase activity slowly.  Call for any questions or concerns.      [name], P.A.      [name], M.D.  Electronically Signed    LB/MEDQ  D:  10/04/2007  T:  10/04/2007  Job:  [id number]   cc:   Champ Mungo. Lovena Le, MD

## 2011-04-04 NOTE — Cardiovascular Report (Signed)
[place]. [name]  Patient:    [name], [name] Visit Number: [id number] MRN: [id number]          Service Type: CAT Location: [facility] 2864 01 Attending Physician:  [name] Dictated by:   [name], M.D. Proc. Date: 05/09/02 Admit Date:  05/09/2002 Discharge Date: 05/09/2002   CC:         Hal T. Stoneking, M.D.  Len Childs, M.D.   Cardiac Catheterization  DATE OF BIRTH: 1926/04/01  REFERRING PHYSICIAN: Hal T. Stoneking, M.D.  CHIEF COMPLAINT: This is a 77 year old white female with no previous cardiac history, but a history of hypertension and hyperlipidemia, recently noted to be in atrial fibrillation. A 2-D echocardiogram showed a mildly dilated left ventricular cavity with mild LV dysfunction and moderate to severe MR associated with mild posterior calcification and mitral annulus. She now presents for heart catheterization.  PROCEDURES PERFORMED: Left and right heart catheterization, coronary angiography, left ventriculography.  OPERATOR: [name], M.D.  INDICATIONS: Dyspnea and MR.  COMPLICATIONS: None.  INTERVENOUS ACCESS: Via right femoral artery, 6 French sheath and right femoral vein, 8 French sheath.  DESCRIPTION OF PROCEDURE: The patient is brought to the cardiac catheterization laboratory in a fasting, nonsedated state.  Informed consent was obtained.  The patient was connected to continuous heart rate and pulse oximetry monitoring, and intermittent blood pressure monitoring. The right groin was prepped and draped in a sterile fashion.  Xylocaine 1% was used for local anesthesia.  Using the modified Seldinger technique, a 6 French sheath was placed in the right femoral artery.  Using a modified Seldinger technique an 8 French sheath was placed in the right femoral vein. Under fluoroscopic guidance, a 7.5 French balloon-guided Swan-Ganz catheter was placed into the right atrium where right atrial  pressure was measured. This was all done under fluoroscopic guidance. The right atrial saturation was also measured. The catheter was then moved from the right atrium and the right ventricle. Left ventricular pressure was measured. The catheter was then advanced into the pulmonary artery where pulmonary artery pressure was monitored. The catheter was then advanced into the pulmonary capillary wedge position where pressures were measured.  The balloon was then deflated. Pulmonary arterial saturation was obtained. Cardiac outputs were obtained using three separate injections at 10 cc of saline on each occasion. A 6 French angled pigtail catheter was placed under fluoroscopic guidance into the left ventricular cavity. The Swan-Ganz balloon catheter balloon was inflated and allowed to fall into the wedge position. Simultaneous wedge and LV pressures were measured.  The balloon was then deflated and the PA catheter was removed. The left ventriculography was then performed in a 30-degree RAO view using a total of 30 cc of contrast at 13 cc/sec. The catheter was then pulled back across the aortic valve with no significant pressure gradient. The catheter was then exchanged out for a 6 [place] JL4 catheter, which was placed in the left coronary artery. Multiple cine films were taken in a 30-degree RAO, 40-degree LAO LAO views. This catheter was then exchanged out over a guide wire for a 6 [place] JR4 catheter which was placed under fluoroscopic guidance into the right coronary artery. Multiple cine films were taken in a 30-degree RAO, 40-degree LAO views. This catheter was then removed at the end of the procedure. At the end of the procedure, all catheters and sheaths were removed. Manual compression was performed until adequate hemostasis was obtained. The patient was transferred back to  the room in stable condition.  RESULTS:  Right heart catheterization data: The right atrial pressure was 14 mmHg  mean. Left ventricular pressure 40/14 mmHg. PA pressure 41/21 mmHg. Pulmonary capillary wedge pressure 22 mmHg, mean. Mitral valve gradient 4.8 mmHg. Mitral valve area 2.5 sq cm. Left ventricular pressure 151/22 mmHg, aortic pressure 150/76 mmHg. Cardiac output by thermodilution 4.9 by Fick 4.2. Cardiac index by thermodilution was 2.3 and by Fick was 2.0. Aortic saturation 95%. O2 saturation 65% and PA O2 saturation 64%.  LEFT VENTRICULOGRAM: Left ventriculography performed in the 30-degree RAO view using a total of 30 cc of contrast at 13 cc/sec. showed mild LV dilatation with mild LV dysfunction. EF 45-50% with moderate to severe MR, markedly dilated left atrium.  The left coronary artery is widely patent and bifurcates into a left anterior descending artery and left circumflex artery. Left anterior descending artery is widely patent throughout its course and gives rise to one diagonal branch over which is widely patent.  Left circumflex is widely patent throughout its course and gives rise to one large obtuse marginal branch, which then branches into two daughter branches, both of which are widely patent. The ongoing left circumflex trifurcates distally in vessels.  The right coronary artery is widely patent throughout its course and bifurcates distally in the posterior descending artery and posterolateral artery.  ASSESSMENT: 1. Dyspnea. 2. Moderate to severe mitral regurgitation. 3. Moderate pulmonary hypertension. 4. Mild left ventricular dysfunction with mild left ventricular    enlargement.  PLAN: Referral to Dr. Prescott Gum with CVTS for mitral valve repair or replacement with questionable mitral valve ring. We will discharge him after bedrest, restart Coumadin, continue ARB for afterload reduction. Dictated by:   [name], M.D. Attending Physician:  [name] DD:  05/09/02 TD:  05/10/02 Job: [id number]

## 2011-04-04 NOTE — Discharge Summary (Signed)
[name], [name]                          ACCOUNT NO.:  [id number]   MEDICAL RECORD NO.:  [id number]                   PATIENT TYPE:  INP   LOCATION:  2028                                 FACILITY:  [facility]   PHYSICIAN:  Ivin Poot, M.D.               DATE OF BIRTH:  Oct 04, 1926   DATE OF ADMISSION:  07/13/2002  DATE OF DISCHARGE:  07/28/2002                                 DISCHARGE SUMMARY   ADMITTING DIAGNOSIS:  Mitral valve regurgitation.   DISCHARGE DIAGNOSIS:  Mitral valve regurgitation.   SECONDARY DIAGNOSES:  1. Symptomatic bradycardia.  2. Postoperative anemia secondary to blood loss.  3. Atrial fibrillation.   HOSPITAL COURSE:  The patient was admitted to [facility] on July 13, 2002 secondary to mitral valve regurgitation.  She was seen and  evaluated by Dr. Prescott Gum as an outpatient.  On date of admission, he  performed a mitral valve repair with a 28 mm annuloplasty ring and an  oversew of the left atrial appendage.  Postoperatively, the patient  developed atrial fibrillation.  She was anticoagulated with Coumadin  secondary to her atrial fibrillation and because of her mitral valve ring.  During the postoperative course, the patient developed some symptomatic  bradycardia.  Because of this she was started on low dose pressor support  with dopamine.  The dopamine was weaned off as appropriately, but she did  require secondary pacing secondary to her bradycardia.  Because of this Dr.  Prescott Gum felt pacemaker was necessary.  She had a pacemaker placed on  July 25, 2002 without difficulty.  Her pacemaker functioned properly  following her postoperative  course.  The patient continued to make slow  steady progress and was subsequently deemed stable for discharge home on  July 28, 2002.   MEDICATIONS AT TIME OF DISCHARGE:  1. Protonix 40 mg one daily.  2. Lasix 40 mg one daily.  3. Potassium chloride 20 mEq one daily.  4. Ultram 50 mg  one to two tablets every 4-6 hours as needed for pain.  5. Coumadin.  The patient will initially be taking 5 mg daily or as directed     by her cardiologist.   ACTIVITY:  The patient was told no driving, strenuous activity, or lifting  heavy objects.  She was told to walk daily and continue breathing exercises.   DISCHARGE DIET:  Low fat, low salt.   WOUND CARE:  The patient was told she could shower and clean her incisions  with soap and water.   DISPOSITION:  Home.   FOLLOW UP:  The patient will see Dr. Prescott Gum in 3 weeks at the [facility]  office.  The exact time and date of appointment will be given to her before  discharge.  She was told also to see her cardiologist on Monday, August 01, 2002 at 2:15 p.m. for INR check at  the [facility].  She is also told to have a followup appointment with [facility] on  Thursday, August 11, 2002 at 9:30 a.m.  She was also told to see [facility] on Wednesday, October 26, 2002 at 11:30 a.m.     Mary Sella. Steward, P.A.                      Ivin Poot, M.D.    BGS/MEDQ  D:  07/27/2002  T:  07/29/2002  Job:  [id number]   cc:   [facility]

## 2011-04-04 NOTE — Op Note (Signed)
[name], [name]                          ACCOUNT NO.:  [id number]   MEDICAL RECORD NO.:  [id number]                   PATIENT TYPE:  INP   LOCATION:  2028                                 FACILITY:  [facility]   PHYSICIAN:  Ivin Poot III, M.D.           DATE OF BIRTH:  1925-12-31   DATE OF PROCEDURE:  07/25/2002  DATE OF DISCHARGE:                                 OPERATIVE REPORT   PREOPERATIVE DIAGNOSIS:  Slow atrial fibrillation, status post mitral valve  repair.   POSTOPERATIVE DIAGNOSIS:  Slow atrial fibrillation, status post mitral valve  repair.   OPERATION:  Placement of permanent transvenous dual chamber DDD pacemaker.   SURGEON:  Ivin Poot, M.D.   ANESTHESIA:  MAC.   INDICATIONS:  The patient is a 77 year old female, who had previously  undergone mitral valve repair for mitral regurgitation, and preoperative  atrial fibrillation.  She had persistent postoperative atrial fibrillation  with heart rates dropping in the 40 and 50 range.  A permanent transvenous  pacemaker was recommended by her cardiologist, Dr. Radford Pax, and her  anticoagulation was held, and she was prepared for a permanent transvenous  pacemaker.  Prior to the surgery, I discussed the procedure with the  patient, including the benefits and the risks of bleeding, pneumothorax, and  infection.  She understood and agreed to proceed with the operation as  planned.   DESCRIPTION OF PROCEDURE:  The patient was brought to the operating room  from her hospital room, where she was being ventricularly paced with  temporary epicardial pacing wires.  The chest was prepped and draped and she  received intravenous Fentanyl, Diprivan and Versed.  Local anesthesia was  infiltrated beneath the left clavicle and a 5 cm incision was made and  carried down to the pectoralis fascia.  A subcutaneous pocket was created.  The ventricular pacing lead was then placed via a percutaneous access to the  left  subclavian vein.  The pacing lead was placed through a sheath under  fluoroscopy into the apex of the right ventricle.  Threshold and sensing  were measured.  The V wave was 28.7 mV and the threshold was 0.6 volts.  The  resistance was 874 ohms.   The ventricular pacing lead was a [employer] screw in leap with the serial  number [id number].   The right atrial lead was then placed using a J-wire and using percutaneous  access.  The serial number of the atrial lead was [id number].  It was placed  with a screw in technique into the right atrium.  Since the patient was in  atrial fibrillation, the A-wave was measured at 1.1 mV, and impedance was  measured at 515 ohms.  A threshold was not measured.  The leads were then  connected to the [employer] pacemaker, serial number [id number].  The  pacemaker started pacing appropriately at a heart rate of 75.  The pocket  was irrigated with antibiotic irrigation.  The  pacemaker and wires were closed in the subcutaneous pocket using interrupted  2-0 Vicryl for the deep layer, running 3-0 Vicryl for the subcutaneous fat,  and a running 3-0 Vicryl for the skin.  Sterile dressings were applied, and  the patient returned to the recovery room in stable condition.                                               Len Childs, M.D.    PV/MEDQ  D:  07/25/2002  T:  07/26/2002  Job:  [id number]   cc:   [facility] office   Traci R. Radford Pax, M.D.  [address], [place]  [place]  [place] [address]  Fax: [phone]

## 2011-08-06 LAB — URINALYSIS, ROUTINE W REFLEX MICROSCOPIC
Glucose, UA: NEGATIVE
Hgb urine dipstick: NEGATIVE
Leukocytes, UA: NEGATIVE
Protein, ur: 30 — AB
Specific Gravity, Urine: 1.033 — ABNORMAL HIGH

## 2011-08-06 LAB — DIFFERENTIAL
Eosinophils Absolute: 0.1
Eosinophils Relative: 2
Lymphocytes Relative: 27
Lymphs Abs: 1.9
Monocytes Absolute: 0.6
Monocytes Relative: 8

## 2011-08-06 LAB — COMPREHENSIVE METABOLIC PANEL
ALT: 22
Alkaline Phosphatase: 78
BUN: 14
Calcium: 8.3 — ABNORMAL LOW
Creatinine, Ser: 1.21 — ABNORMAL HIGH
Potassium: 3.4 — ABNORMAL LOW
Total Protein: 6.6

## 2011-08-06 LAB — URINE MICROSCOPIC-ADD ON

## 2011-08-06 LAB — CBC
HCT: 31.8 — ABNORMAL LOW
Hemoglobin: 10.5 — ABNORMAL LOW
RBC: 3.77 — ABNORMAL LOW

## 2011-08-06 LAB — PROTIME-INR: Prothrombin Time: 18.4 — ABNORMAL HIGH

## 2011-08-07 LAB — CROSSMATCH: ABO/RH(D): A POS

## 2011-08-07 LAB — HEMOGLOBIN AND HEMATOCRIT, BLOOD
HCT: 27.2 — ABNORMAL LOW
Hemoglobin: 8.8 — ABNORMAL LOW

## 2011-08-07 LAB — PROTIME-INR: INR: 1.1

## 2011-08-07 LAB — ABO/RH: ABO/RH(D): A POS

## 2011-08-21 LAB — PROTIME-INR: INR: 2.7 — ABNORMAL HIGH (ref 0.00–1.49)

## 2011-08-21 LAB — APTT: aPTT: 56 seconds — ABNORMAL HIGH (ref 24–37)

## 2011-08-27 LAB — BASIC METABOLIC PANEL
BUN: 17
BUN: 17
CO2: 24
CO2: 25
Calcium: 8 — ABNORMAL LOW
Calcium: 8.1 — ABNORMAL LOW
Chloride: 108
Chloride: 110
Creatinine, Ser: 1.36 — ABNORMAL HIGH
Creatinine, Ser: 1.42 — ABNORMAL HIGH
GFR calc Af Amer: 41 — ABNORMAL LOW
GFR calc Af Amer: 46 — ABNORMAL LOW
GFR calc Af Amer: 48 — ABNORMAL LOW
GFR calc non Af Amer: 34 — ABNORMAL LOW
GFR calc non Af Amer: 36 — ABNORMAL LOW
Glucose, Bld: 91
Glucose, Bld: 96
Potassium: 3.6
Potassium: 4.1
Potassium: 4.3
Sodium: 141
Sodium: 144

## 2011-08-27 LAB — PROTIME-INR
INR: 2 — ABNORMAL HIGH
INR: 2.2 — ABNORMAL HIGH
INR: 2.2 — ABNORMAL HIGH
Prothrombin Time: 23.6 — ABNORMAL HIGH
Prothrombin Time: 25.1 — ABNORMAL HIGH

## 2011-08-27 LAB — DIGOXIN LEVEL: Digoxin Level: <0.2 — ABNORMAL LOW

## 2011-08-27 LAB — MAGNESIUM: Magnesium: 2.2

## 2011-08-29 LAB — CBC
Hemoglobin: 11.4 — ABNORMAL LOW
RBC: 4.17
RDW: 17.5 — ABNORMAL HIGH
WBC: 9.8

## 2011-08-29 LAB — MAGNESIUM: Magnesium: 2.2

## 2011-08-29 LAB — COMPREHENSIVE METABOLIC PANEL
ALT: 16
Alkaline Phosphatase: 67
CO2: 27
Glucose, Bld: 86
Potassium: 4.5
Sodium: 143
Total Protein: 7

## 2011-08-29 LAB — DIGOXIN LEVEL: Digoxin Level: 0.4 — ABNORMAL LOW

## 2013-09-15 ENCOUNTER — Other Ambulatory Visit: Payer: Self-pay | Admitting: Cardiology

## 2013-09-19 ENCOUNTER — Ambulatory Visit (INDEPENDENT_AMBULATORY_CARE_PROVIDER_SITE_OTHER): Payer: Medicare Other | Admitting: Pharmacist

## 2013-09-19 ENCOUNTER — Encounter (INDEPENDENT_AMBULATORY_CARE_PROVIDER_SITE_OTHER): Payer: Self-pay

## 2013-09-19 DIAGNOSIS — I4891 Unspecified atrial fibrillation: Secondary | ICD-10-CM | POA: Insufficient documentation

## 2013-10-10 ENCOUNTER — Inpatient Hospital Stay (HOSPITAL_COMMUNITY): Payer: Medicare Other

## 2013-10-10 ENCOUNTER — Inpatient Hospital Stay (HOSPITAL_COMMUNITY)
Admission: EM | Admit: 2013-10-10 | Discharge: 2013-10-20 | DRG: 347 | Disposition: A | Discharge status: Home / Self Care | Payer: Medicare Other | Attending: Internal Medicine | Admitting: Internal Medicine

## 2013-10-10 ENCOUNTER — Emergency Department (HOSPITAL_COMMUNITY): Payer: Medicare Other

## 2013-10-10 ENCOUNTER — Encounter (HOSPITAL_COMMUNITY): Payer: Self-pay | Admitting: Emergency Medicine

## 2013-10-10 DIAGNOSIS — K7460 Unspecified cirrhosis of liver: Secondary | ICD-10-CM | POA: Diagnosis present

## 2013-10-10 DIAGNOSIS — Z66 Do not resuscitate: Secondary | ICD-10-CM | POA: Diagnosis present

## 2013-10-10 DIAGNOSIS — R112 Nausea with vomiting, unspecified: Secondary | ICD-10-CM | POA: Diagnosis present

## 2013-10-10 DIAGNOSIS — I509 Heart failure, unspecified: Secondary | ICD-10-CM | POA: Diagnosis present

## 2013-10-10 DIAGNOSIS — K612 Anorectal abscess: Principal | ICD-10-CM | POA: Diagnosis present

## 2013-10-10 DIAGNOSIS — R188 Other ascites: Secondary | ICD-10-CM | POA: Diagnosis present

## 2013-10-10 DIAGNOSIS — D649 Anemia, unspecified: Secondary | ICD-10-CM | POA: Diagnosis present

## 2013-10-10 DIAGNOSIS — N183 Chronic kidney disease, stage 3 unspecified: Secondary | ICD-10-CM | POA: Diagnosis present

## 2013-10-10 DIAGNOSIS — R5381 Other malaise: Secondary | ICD-10-CM | POA: Diagnosis present

## 2013-10-10 DIAGNOSIS — D6959 Other secondary thrombocytopenia: Secondary | ICD-10-CM | POA: Diagnosis present

## 2013-10-10 DIAGNOSIS — G8929 Other chronic pain: Secondary | ICD-10-CM | POA: Diagnosis present

## 2013-10-10 DIAGNOSIS — Z954 Presence of other heart-valve replacement: Secondary | ICD-10-CM

## 2013-10-10 DIAGNOSIS — D539 Nutritional anemia, unspecified: Secondary | ICD-10-CM | POA: Diagnosis present

## 2013-10-10 DIAGNOSIS — Z950 Presence of cardiac pacemaker: Secondary | ICD-10-CM

## 2013-10-10 DIAGNOSIS — I4891 Unspecified atrial fibrillation: Secondary | ICD-10-CM | POA: Diagnosis present

## 2013-10-10 DIAGNOSIS — N390 Urinary tract infection, site not specified: Secondary | ICD-10-CM | POA: Diagnosis present

## 2013-10-10 DIAGNOSIS — R197 Diarrhea, unspecified: Secondary | ICD-10-CM | POA: Diagnosis present

## 2013-10-10 DIAGNOSIS — K611 Rectal abscess: Secondary | ICD-10-CM

## 2013-10-10 DIAGNOSIS — I480 Paroxysmal atrial fibrillation: Secondary | ICD-10-CM

## 2013-10-10 DIAGNOSIS — M109 Gout, unspecified: Secondary | ICD-10-CM | POA: Diagnosis present

## 2013-10-10 DIAGNOSIS — Z7901 Long term (current) use of anticoagulants: Secondary | ICD-10-CM

## 2013-10-10 DIAGNOSIS — Z87891 Personal history of nicotine dependence: Secondary | ICD-10-CM

## 2013-10-10 DIAGNOSIS — I129 Hypertensive chronic kidney disease with stage 1 through stage 4 chronic kidney disease, or unspecified chronic kidney disease: Secondary | ICD-10-CM | POA: Diagnosis present

## 2013-10-10 DIAGNOSIS — D62 Acute posthemorrhagic anemia: Secondary | ICD-10-CM | POA: Diagnosis not present

## 2013-10-10 DIAGNOSIS — D696 Thrombocytopenia, unspecified: Secondary | ICD-10-CM | POA: Diagnosis present

## 2013-10-10 DIAGNOSIS — E876 Hypokalemia: Secondary | ICD-10-CM | POA: Diagnosis not present

## 2013-10-10 DIAGNOSIS — R109 Unspecified abdominal pain: Secondary | ICD-10-CM | POA: Diagnosis present

## 2013-10-10 DIAGNOSIS — M549 Dorsalgia, unspecified: Secondary | ICD-10-CM | POA: Diagnosis present

## 2013-10-10 DIAGNOSIS — I5043 Acute on chronic combined systolic (congestive) and diastolic (congestive) heart failure: Secondary | ICD-10-CM | POA: Diagnosis present

## 2013-10-10 DIAGNOSIS — R32 Unspecified urinary incontinence: Secondary | ICD-10-CM | POA: Diagnosis not present

## 2013-10-10 DIAGNOSIS — I959 Hypotension, unspecified: Secondary | ICD-10-CM | POA: Diagnosis present

## 2013-10-10 DIAGNOSIS — N179 Acute kidney failure, unspecified: Secondary | ICD-10-CM | POA: Diagnosis present

## 2013-10-10 HISTORY — DX: Unspecified cirrhosis of liver: K74.60

## 2013-10-10 HISTORY — DX: Chronic kidney disease, unspecified: N18.9

## 2013-10-10 HISTORY — DX: Rheumatic mitral valve disease, unspecified: I05.9

## 2013-10-10 HISTORY — DX: Essential (primary) hypertension: I10

## 2013-10-10 HISTORY — DX: Other ventricular tachycardia: I47.29

## 2013-10-10 HISTORY — DX: Ventricular tachycardia: I47.2

## 2013-10-10 LAB — URINE MICROSCOPIC-ADD ON

## 2013-10-10 LAB — BASIC METABOLIC PANEL
CO2: 21 mEq/L (ref 19–32)
Chloride: 108 mEq/L (ref 96–112)
Glucose, Bld: 109 mg/dL — ABNORMAL HIGH (ref 70–99)
Potassium: 3.5 mEq/L (ref 3.5–5.1)
Sodium: 141 mEq/L (ref 135–145)

## 2013-10-10 LAB — URINALYSIS, ROUTINE W REFLEX MICROSCOPIC
Glucose, UA: NEGATIVE mg/dL
Ketones, ur: NEGATIVE mg/dL
pH: 5 (ref 5.0–8.0)

## 2013-10-10 LAB — PROTIME-INR: INR: 3.37 — ABNORMAL HIGH (ref 0.00–1.49)

## 2013-10-10 LAB — HEPATIC FUNCTION PANEL
ALT: 7 U/L (ref 0–35)
AST: 12 U/L (ref 0–37)
Albumin: 3.1 g/dL — ABNORMAL LOW (ref 3.5–5.2)
Alkaline Phosphatase: 108 U/L (ref 39–117)
Total Protein: 6.1 g/dL (ref 6.0–8.3)

## 2013-10-10 LAB — POCT I-STAT TROPONIN I
Troponin i, poc: 0.01 ng/mL (ref 0.00–0.08)
Troponin i, poc: 0.02 ng/mL (ref 0.00–0.08)

## 2013-10-10 LAB — CBC
Hemoglobin: 9.3 g/dL — ABNORMAL LOW (ref 12.0–15.0)
MCH: 28.2 pg (ref 26.0–34.0)
RBC: 3.3 MIL/uL — ABNORMAL LOW (ref 3.87–5.11)

## 2013-10-10 LAB — TROPONIN I: Troponin I: <0.3 ng/mL (ref <0.30)

## 2013-10-10 LAB — DIGOXIN LEVEL: Digoxin Level: 0.6 ng/mL — ABNORMAL LOW (ref 0.8–2.0)

## 2013-10-10 MED ORDER — SODIUM CHLORIDE 0.9 % IJ SOLN
3.0000 mL | Freq: Two times a day (BID) | INTRAMUSCULAR | Status: DC
  Administered 2013-10-10 – 2013-10-20 (×11): 3 mL via INTRAVENOUS

## 2013-10-10 MED ORDER — SODIUM CHLORIDE 0.9 % IV BOLUS (SEPSIS)
500.0000 mL | Freq: Once | INTRAVENOUS | Status: AC
  Administered 2013-10-10: 500 mL via INTRAVENOUS

## 2013-10-10 MED ORDER — ONDANSETRON HCL 4 MG/2ML IJ SOLN
4.0000 mg | Freq: Once | INTRAMUSCULAR | Status: AC
  Administered 2013-10-10: 4 mg via INTRAVENOUS
  Filled 2013-10-10: qty 2

## 2013-10-10 MED ORDER — ALBUTEROL SULFATE (5 MG/ML) 0.5% IN NEBU: 2.5000 mg | INHALATION_SOLUTION | Freq: Four times a day (QID) | RESPIRATORY_TRACT | Status: DC | PRN

## 2013-10-10 MED ORDER — CEFTRIAXONE SODIUM 1 G IJ SOLR
1.0000 g | Freq: Once | INTRAMUSCULAR | Status: AC
  Administered 2013-10-10: 1 g via INTRAVENOUS
  Filled 2013-10-10: qty 10

## 2013-10-10 MED ORDER — DEXTROSE 5 % IV SOLN
1.0000 g | INTRAVENOUS | Status: DC
  Administered 2013-10-11 – 2013-10-12 (×2): 1 g via INTRAVENOUS
  Filled 2013-10-10 (×2): qty 10

## 2013-10-10 MED ORDER — BENZONATATE 100 MG PO CAPS
100.0000 mg | ORAL_CAPSULE | Freq: Once | ORAL | Status: AC
  Administered 2013-10-10: 100 mg via ORAL
  Filled 2013-10-10: qty 1

## 2013-10-10 NOTE — ED Provider Notes (Signed)
CSN: [id number]     Arrival date & time 10/10/13  1148 History   First MD Initiated Contact with Patient 10/10/13 1150     Chief Complaint  Patient presents with  . Fall  . Emesis   (Consider location/radiation/quality/duration/timing/severity/associated sxs/prior Treatment) Patient is a 79 y.o. female presenting with vomiting. The history is provided by the patient.  Emesis Severity:  Moderate Timing:  Constant Number of daily episodes:  3 Quality:  Stomach contents Able to tolerate:  Solids Progression:  Unchanged Chronicity:  New Recent urination:  Normal Context: not post-tussive   Relieved by:  Nothing Worsened by:  Nothing tried Ineffective treatments:  None tried Associated symptoms: diarrhea   Associated symptoms: no abdominal pain, no chills and no fever     Past Medical History  Diagnosis Date  . CHF (congestive heart failure)   . Hypertension   . Renal disorder    Past Surgical History  Procedure Laterality Date  . Valve replacement    . Cholecystectomy    . Appendectomy     No family history on file. History  Substance Use Topics  . Smoking status: Former Smoker    Quit date: 10/11/1971  . Smokeless tobacco: Not on file  . Alcohol Use: Yes     Comment: occational   OB History   Grav Para Term Preterm Abortions TAB SAB Ect Mult Living                 Review of Systems  Constitutional: Negative for fever and chills.  Respiratory: Negative for cough and shortness of breath.   Gastrointestinal: Positive for diarrhea. Negative for vomiting and abdominal pain.  All other systems reviewed and are negative.    Allergies  Review of patient's allergies indicates no known allergies.  Home Medications   Current Outpatient Rx  Name  Route  Sig  Dispense  Refill  . acetaminophen (TYLENOL) 500 MG tablet   Oral   Take 500 mg by mouth every 6 (six) hours as needed for mild pain.         . beta carotene w/minerals (OCUVITE) tablet   Oral   Take 1  tablet by mouth daily.         . carvedilol (COREG) 12.5 MG tablet   Oral   Take 12.5 mg by mouth 2 (two) times daily with a meal.         . cetirizine (ZYRTEC) 10 MG tablet   Oral   Take 10 mg by mouth daily.         . colchicine 0.6 MG tablet   Oral   Take 0.6 mg by mouth daily.         . digoxin (LANOXIN) 0.125 MG tablet   Oral   Take 0.0625 mg by mouth daily.         . ferrous sulfate 325 (65 FE) MG tablet   Oral   Take 325 mg by mouth daily with breakfast.         . furosemide (LASIX) 20 MG tablet   Oral   Take 20-40 mg by mouth daily as needed (for swelling).         . Oxymetazoline HCl (NOSTRILLA CONGESTION RELIEF NA)   Nasal   Place 1 spray into the nose daily as needed (for congestion).         . potassium chloride SA (K-DUR,KLOR-CON) 20 MEQ tablet   Oral   Take 20 mEq by mouth daily.         [name]  warfarin (COUMADIN) 5 MG tablet   Oral   Take 2.5-5 mg by mouth daily. Take 2.5mg  daily, except take 5mg  on Mondays.          BP 103/57  Pulse 70  Temp(Src) 98.9 F (37.2 C) (Oral)  Resp 25  SpO2 96%  LMP 10/10/2013 Physical Exam  Nursing note and vitals reviewed. Constitutional: She is oriented to person, place, and time. She appears well-developed and well-nourished. No distress.  HENT:  Head: Normocephalic and atraumatic.  Eyes: EOM are normal. Pupils are equal, round, and reactive to light.  Neck: Normal range of motion. Neck supple.  Cardiovascular: Normal rate and regular rhythm.  Exam reveals no friction rub.   No murmur heard. Pulmonary/Chest: Effort normal and breath sounds normal. No respiratory distress. She has no wheezes. She has no rales.  Abdominal: Soft. She exhibits no distension. There is no tenderness. There is no rebound.  Genitourinary: There is tenderness (mild L labial swelling, redness, no cyst palpated) around the vagina.  Musculoskeletal: Normal range of motion. She exhibits no edema.  Neurological: She is alert  and oriented to person, place, and time. No cranial nerve deficit. She exhibits normal muscle tone. Coordination normal.  Skin: No rash noted. She is not diaphoretic.    ED Course  Procedures (including critical care time) Labs Review Labs Reviewed  CBC - Abnormal; Notable for the following:    WBC 10.8 (*)    RBC 3.30 (*)    Hemoglobin 9.3 (*)    HCT 28.4 (*)    Platelets 111 (*)    All other components within normal limits  BASIC METABOLIC PANEL - Abnormal; Notable for the following:    Glucose, Bld 109 (*)    BUN 25 (*)    Creatinine, Ser 1.49 (*)    Calcium 8.0 (*)    GFR calc non Af Amer 31 (*)    GFR calc Af Amer 36 (*)    All other components within normal limits  HEPATIC FUNCTION PANEL - Abnormal; Notable for the following:    Albumin 3.1 (*)    Total Bilirubin 2.1 (*)    Bilirubin, Direct 0.6 (*)    Indirect Bilirubin 1.5 (*)    All other components within normal limits  PROTIME-INR - Abnormal; Notable for the following:    Prothrombin Time 32.9 (*)    INR 3.37 (*)    All other components within normal limits  LACTIC ACID, PLASMA  URINALYSIS, ROUTINE W REFLEX MICROSCOPIC  POCT I-STAT TROPONIN I  POCT I-STAT TROPONIN I   Imaging Review No results found.  EKG Interpretation    Date/Time:  Monday October 10 2013 11:50:54 EST Ventricular Rate:  71 PR Interval:  194 QRS Duration: 152 QT Interval:  449 QTC Calculation: 488 R Axis:   -76 Text Interpretation:  Ventricular-paced complexes No further analysis attempted due to paced rhythm Confirmed by Mingo Amber  MD, [place] ([id number]) on 10/10/2013 11:53:38 AM            MDM   1. UTI (urinary tract infection)    87F here with N/V/D. Began about 3 days ago, worsening. No fevers. Normal urination and bowel movements. No sick contacts, no coughing, no CP. AFVSS here, soft BPs with systolics in the upper 0000000, lower 100s. Lungs clear. Abdomen with mild distention, but nontender. GU exam shows L labia with redness,  no gross abscess or cyst noted. Exam not consistent with Bartholin's. Labs show very mild white count at 10.8. Will  CT scan abdomen.  Urine shows UTI. Rocephin given. Admitted to medicine. Patient's CT shows new onset ascites, possible cirrhosis. The hospitalist discussed these findings with the patient.     Osvaldo Shipper, MD 10/11/13 [phone]

## 2013-10-10 NOTE — H&P (Signed)
[facility] History and Physical   [name] [id number] DOB: 07-20-1926 DOA: 10/10/2013  Referring physician: Dr. Mingo Amber PCP: No primary provider on file.  Specialists: none  Chief Complaint: weakness, NV for 3 days  HPI: [name] is a 79 y.o. female has a past medical history significant for reported CHF (follows Dr. Radford Pax and I have no access to 2D echos, last 2 D echo here 2007 with EF 45-50%), history of mitral valve annuloplasty, A fib, Pacemaker, on chronic anticoagulation with Coumadin, presents to the emergency room with a chief complaint of nausea vomiting and feeling weak for the past 3 days. She was feeling at baseline until few days ago when all this started. She denies fever/chills, denies diarrhea. She has no lightheadedness or dizziness. She denies leg swelling however endorses abdominal swelling progressive for the past week. She gained 15 lbs. Also complains of a cough and mild SOB. Non productive. In the ED patient with evidence of UTI and she was started empirically on Ceftriaxone. Denies dysuria. No chest pain, has some abdominal pain with her cough  Review of Systems: as per HPI otherwise negative  Past Medical History  Diagnosis Date  . CHF (congestive heart failure)   . Hypertension   . Renal disorder    Past Surgical History  Procedure Laterality Date  . Valve replacement    . Cholecystectomy    . Appendectomy     Social History:  reports that she quit smoking about 42 years ago. She does not have any smokeless tobacco history on file. She reports that she drinks alcohol. Her drug history is not on file.  No Known Allergies  Family history non contributory.   Prior to Admission medications   Medication Sig Start Date End Date Taking? Authorizing Provider  acetaminophen (TYLENOL) 500 MG tablet Take 500 mg by mouth every 6 (six) hours as needed for mild pain.   Yes Historical Provider, MD  beta carotene w/minerals (OCUVITE) tablet Take  1 tablet by mouth daily.   Yes Historical Provider, MD  carvedilol (COREG) 12.5 MG tablet Take 12.5 mg by mouth 2 (two) times daily with a meal.   Yes Historical Provider, MD  cetirizine (ZYRTEC) 10 MG tablet Take 10 mg by mouth daily.   Yes Historical Provider, MD  colchicine 0.6 MG tablet Take 0.6 mg by mouth daily.   Yes Historical Provider, MD  digoxin (LANOXIN) 0.125 MG tablet Take 0.0625 mg by mouth daily.   Yes Historical Provider, MD  ferrous sulfate 325 (65 FE) MG tablet Take 325 mg by mouth daily with breakfast.   Yes Historical Provider, MD  furosemide (LASIX) 20 MG tablet Take 20-40 mg by mouth daily as needed (for swelling).   Yes Historical Provider, MD  Oxymetazoline HCl (NOSTRILLA CONGESTION RELIEF NA) Place 1 spray into the nose daily as needed (for congestion).   Yes Historical Provider, MD  potassium chloride SA (K-DUR,KLOR-CON) 20 MEQ tablet Take 20 mEq by mouth daily.   Yes Historical Provider, MD  warfarin (COUMADIN) 5 MG tablet Take 2.5-5 mg by mouth daily. Take 2.5mg  daily, except take 5mg  on Mondays.   Yes Historical Provider, MD   Physical Exam: Filed Vitals:   10/10/13 1350 10/10/13 1400 10/10/13 1526 10/10/13 1530  BP: 106/42 103/57 122/52 105/41  Pulse: 72 70 74 73  Temp:      TempSrc:      Resp: 22 25 21 23   SpO2: 98% 96% 92% 100%     General:  No apparent distress  Eyes: PERRL, EOMI, no scleral icterus  ENT: moist oropharynx  Neck: supple, mild JVD  Cardiovascular: regular rate without MRG; 2+ peripheral pulses  Respiratory: mild wheezing  Abdomen: soft, distended, non tender to palpation, positive bowel sounds, no guarding, no rebound  Skin: no rashes  Musculoskeletal: trace peripheral edema  Psychiatric: normal mood and affect  Neurologic: non focal  Labs on Admission:  Basic Metabolic Panel:  Recent Labs Lab 10/10/13 1204  NA 141  K 3.5  CL 108  CO2 21  GLUCOSE 109*  BUN 25*  CREATININE 1.49*  CALCIUM 8.0*   Liver Function  Tests:  Recent Labs Lab 10/10/13 1204  AST 12  ALT 7  ALKPHOS 108  BILITOT 2.1*  PROT 6.1  ALBUMIN 3.1*   CBC:  Recent Labs Lab 10/10/13 1204  WBC 10.8*  HGB 9.3*  HCT 28.4*  MCV 86.1  PLT 111*   Radiological Exams on Admission: Ct Abdomen Pelvis Wo Contrast  10/10/2013   CLINICAL DATA:  Vomiting and diarrhea for 3-4 days, generalized weakness, on Coumadin, history is CHF  EXAM: CT ABDOMEN AND PELVIS WITHOUT CONTRAST  TECHNIQUE: Multidetector CT imaging of the abdomen and pelvis was performed following the standard protocol without intravenous contrast.  COMPARISON:  Lumbar spine CT -06/15/2009  FINDINGS: The lack of intravenous contrast limits the ability to evaluate solid abdominal organs.  There is mild nodularity of the hepatic contour, nonspecific though could be seen in the setting of cirrhosis. This finding is associated with small volume of intra-abdominal ascites. Post cholecystectomy. There is apparent fusiform ectasia of the origin of the portal vein regional to the confluence of the superior mesenteric and splenic veins measuring approximately 2.8 cm in greatest oblique axial dimension (image 31, series 2).  The bilateral kidneys are slightly atrophic compatible with provided history of renal insufficiency. No discrete renal stones. No urinary obstruction or perinephric stranding. Normal noncontrast appearance of the bilateral adrenal glands. There is a crescentic approximately 1.6 x 1.4 cm presumed partially calcified splenic artery aneurysm (image 26, series 2). Additional smaller presumed splenic aneurysms are seen about the splenic hilum.  Scattered colonic diverticulosis without evidence of diverticulitis. The colon is largely decompressed. The bowel is otherwise normal in course and caliber without discrete area of wall thickening or evidence of obstruction. The appendix is not definitely identified, however there is no definitive inflammatory change within the right lower  abdominal quadrant. No pneumoperitoneum, pneumatosis or portal venous gas.  Atherosclerotic plaque within a normal caliber abdominal aorta. Scattered shotty retroperitoneal lymph nodes are individually not enlarged by size criteria. No retroperitoneal, mesenteric, pelvic or inguinal lymphadenopathy on this noncontrast examination.  The pelvic organs her expectantly atrophic. No discrete adnexal lesion.  Limited visualization of the lower thorax demonstrates minimal subsegmental atelectasis within the caudal aspect of the right middle lobe in an left lower lobe. No focal airspace opacities. No pleural effusion.  Cardiomegaly. Ventricular pacer leads are seen within the right atrium, ventricle and coronary sinus. No pericardial effusion.  No acute or aggressive osseous abnormalities. There is mild straightening expected lumbar lordosis. Grossly unchanged mild (approximately 25%) compression deformity involving the anterior aspect of the superior endplate of the L4 vertebral body. Grossly unchanged mild (approximately 5 mm) of retrolisthesis of L4 upon L5.  IMPRESSION: 1. Colonic diverticulosis without definite evidence of diverticulitis on this noncontrast examination. The colon is decompressed without definite evidence of obstruction. 2. Mild nodularity of the hepatic contour suggestive of cirrhosis. This finding  is associated with small volume intra-abdominal ascites. 3. Incidental note made of an approximately 1.6 cm splenic artery aneurysm - this is of doubtful clinical concern in this postmenopausal patient. 4. Suspected fusiform ectasia of the origin of the portal vein, measuring approximately 1.8 cm, incompletely evaluated on this noncontrast examination and of uncertain clinical significant. 5. Cardiomegaly.   Electronically Signed   By: Sandi Mariscal M.D.   On: 10/10/2013 15:48   EKG: Independently reviewed. Paced rhythm.  Assessment/Plan Active Problems:   Chronic anticoagulation   Liver cirrhosis    Thrombocytopenia   Nausea and vomiting   CHF, acute on chronic   UTI (urinary tract infection)   CKD (chronic kidney disease) stage 3, GFR 30-59 ml/min   Anemia   Urinary tract infection - patient will be started on empiric ceftriaxone. Followup on urine cultures. Acute on chronic systolic heart failure - last echocardiogram that we have in system was back in 2008. We'll obtain another one. Obtain BNP. Daily weights, strict I&Os. - CXR without evidence of fluid overload.  - borderline hypotensive, hold diuresis. Also judicious fluids only if needed given 15 lb weight gain and ascites - if BP stable consider diuresis Nausea/vomiting - due to #1 Weakness - due to #1. Also cycle troponin x 3 given #2 and some evidence of fluid overload.  S/p mitral valve annuloplasty Atrial fibrillation - on Coumadin. Hold Coumadin tonight as INR supratherapeutic Status post pacemaker Chronic anticoagulation Liver cirrhosis on imaging - patient without known history of any liver disease. She has ascites, elevated total bilirubin, thrombocytopenia.  Thrombocytopenia - due to cirrhosis likely  Diet: heart healthy Fluids: none DVT Prophylaxis: on coumadin  Code Status: DNR  Family Communication: daughter bedside  Disposition Plan: SDU  Time spent: 42  Nasrin Lanzo M. Cruzita Lederer, MD [facility] Pager [phone]  If 7PM-7AM, please contact night-coverage [email or web address] Password [facility] 10/10/2013, 4:56 PM

## 2013-10-10 NOTE — ED Notes (Signed)
Attempted report 

## 2013-10-10 NOTE — ED Notes (Signed)
Per EMS pt came from home after sliding from chair to floor and was unable to get up. Pt was not injured from incident. Pt has had N/V/D x3 days and is c/o generalized weakness. Pt was given 4mg  zofran IV en route. Pt is on coumadin and has hx of CHF.

## 2013-10-11 DIAGNOSIS — I059 Rheumatic mitral valve disease, unspecified: Secondary | ICD-10-CM

## 2013-10-11 DIAGNOSIS — R112 Nausea with vomiting, unspecified: Secondary | ICD-10-CM

## 2013-10-11 DIAGNOSIS — I509 Heart failure, unspecified: Secondary | ICD-10-CM

## 2013-10-11 LAB — BASIC METABOLIC PANEL
BUN: 35 mg/dL — ABNORMAL HIGH (ref 6–23)
CO2: 22 mEq/L (ref 19–32)
Chloride: 104 mEq/L (ref 96–112)
Creatinine, Ser: 1.9 mg/dL — ABNORMAL HIGH (ref 0.50–1.10)
Glucose, Bld: 133 mg/dL — ABNORMAL HIGH (ref 70–99)
Potassium: 3.8 mEq/L (ref 3.5–5.1)

## 2013-10-11 LAB — TROPONIN I: Troponin I: <0.3 ng/mL (ref <0.30)

## 2013-10-11 LAB — CBC
HCT: 30.6 % — ABNORMAL LOW (ref 36.0–46.0)
Hemoglobin: 10 g/dL — ABNORMAL LOW (ref 12.0–15.0)
MCH: 28.3 pg (ref 26.0–34.0)
MCHC: 32.7 g/dL (ref 30.0–36.0)
MCV: 86.7 fL (ref 78.0–100.0)
Platelets: 124 10*3/uL — ABNORMAL LOW (ref 150–400)
RBC: 3.53 MIL/uL — ABNORMAL LOW (ref 3.87–5.11)

## 2013-10-11 MED ORDER — ONDANSETRON 4 MG PO TBDP
4.0000 mg | ORAL_TABLET | Freq: Three times a day (TID) | ORAL | Status: DC | PRN
  Administered 2013-10-11: 4 mg via ORAL
  Filled 2013-10-11 (×2): qty 1

## 2013-10-11 MED ORDER — FUROSEMIDE 10 MG/ML IJ SOLN
40.0000 mg | Freq: Once | INTRAMUSCULAR | Status: AC
  Administered 2013-10-11: 40 mg via INTRAVENOUS
  Filled 2013-10-11: qty 4

## 2013-10-11 MED ORDER — FUROSEMIDE 10 MG/ML IJ SOLN
INTRAMUSCULAR | Status: AC
  Filled 2013-10-11: qty 4

## 2013-10-11 MED ORDER — TRAMADOL HCL 50 MG PO TABS
25.0000 mg | ORAL_TABLET | Freq: Four times a day (QID) | ORAL | Status: DC | PRN
  Administered 2013-10-11 – 2013-10-18 (×5): 25 mg via ORAL
  Filled 2013-10-11 (×5): qty 1

## 2013-10-11 MED ORDER — FUROSEMIDE 10 MG/ML IJ SOLN
20.0000 mg | Freq: Once | INTRAMUSCULAR | Status: AC
Start: 2013-10-11 — End: 2013-10-11
  Administered 2013-10-11: 20 mg via INTRAVENOUS

## 2013-10-11 MED ORDER — HYDROCODONE-HOMATROPINE 5-1.5 MG/5ML PO SYRP
5.0000 mL | ORAL_SOLUTION | Freq: Two times a day (BID) | ORAL | Status: DC
  Administered 2013-10-11 – 2013-10-13 (×7): 5 mL via ORAL
  Filled 2013-10-11 (×7): qty 5

## 2013-10-11 MED ORDER — LOPERAMIDE HCL 2 MG PO CAPS
2.0000 mg | ORAL_CAPSULE | Freq: Three times a day (TID) | ORAL | Status: DC | PRN
  Administered 2013-10-20: 2 mg via ORAL
  Filled 2013-10-11: qty 1

## 2013-10-11 NOTE — Progress Notes (Addendum)
[facility] PROGRESS NOTE      [name] [id number] DOB: 07/01/1926 DOA: 10/10/2013 PCP: No primary provider on file.  HPI: [name] is a 79 y.o. female has a past medical history significant for reported CHF (follows Dr. Radford Pax and I have no access to 2D echos, last 2 D echo here 2007 with EF 45-50%), history of mitral valve annuloplasty, A fib, Pacemaker, on chronic anticoagulation with Coumadin, presents to the emergency room with a chief complaint of nausea vomiting and feeling weak for the past 3 days. She was feeling at baseline until few days ago when all this started. She denies fever/chills, denies diarrhea. She has no lightheadedness or dizziness. She denies leg swelling however endorses abdominal swelling progressive for the past week. She gained 15 lbs. Also complains of a cough and mild SOB. Non productive. In the ED patient with evidence of UTI and she was started empirically on Ceftriaxone. Denies dysuria. No chest pain, has some abdominal pain with her cough  Assessment/Plan: Urinary tract infection - patient started on empiric ceftriaxone, blood pressures better overnight. Followup on urine cultures.  Acute on chronic systolic heart failure - last echocardiogram that we have in system was back in 2008. BNP elevated last night. 2D echo done this morning shows further decrease in her EF to 35-40% and anteroseptal hypokinesis. Cardiology consulted, appreciate input. Blood pressure stable throughout the night, start IV Lasix this morning.  Nausea/vomiting - due to #1  - mildly improved this morning, eating breakfast OK. Weakness - due to #1.  - troponin negative x 3 S/p mitral valve annuloplasty  Atrial fibrillation - on Coumadin. Continue to hold Coumadin as INR even worse this morning to 4.9 - has underlying liver disease which is not helping.  Status post pacemaker  Chronic anticoagulation - if her sole indication for anticoagulation is A fib, in the light  of liver disease this might need to be revised during this hospitalization. I do not have access to her cardiology notes from outpatient, appreciate cardiology input into this matter.  Liver cirrhosis on imaging - patient without known history of any liver disease. She has ascites, elevated total bilirubin, thrombocytopenia.  Thrombocytopenia - due to cirrhosis likely  Diet: heart Fluids: none DVT Prophylaxis: Coumadin  Code Status: DNR Family Communication: none this morning  Disposition Plan: inpatienet  Consultants:  none  Procedures:  none   Antibiotics  Anti-infectives   Start     Dose/Rate Route Frequency Ordered Stop   10/11/13 0600  cefTRIAXone (ROCEPHIN) 1 g in dextrose 5 % 50 mL IVPB     1 g 100 mL/hr over 30 Minutes Intravenous Every 24 hours 10/10/13 1828     10/10/13 1530  cefTRIAXone (ROCEPHIN) 1 g in dextrose 5 % 50 mL IVPB     1 g 100 mL/hr over 30 Minutes Intravenous  Once 10/10/13 1529 10/10/13 1635     Antibiotics Given (last 72 hours)   Date/Time Action Medication Dose Rate   10/11/13 0626 Given   cefTRIAXone (ROCEPHIN) 1 g in dextrose 5 % 50 mL IVPB 1 g 100 mL/hr     HPI/Subjective: - still weak this morning but with some improvement.   Objective: Filed Vitals:   10/11/13 0000 10/11/13 0017 10/11/13 0500 10/11/13 0827  BP: 101/56 101/56 116/51 119/41  Pulse: 80 69 72 72  Temp:  99.5 F (37.5 C) 98.9 F (37.2 C) 98.6 F (37 C)  TempSrc:  Oral Oral Oral  Resp: 25 29  19 22  Height:      Weight:   106.1 kg (233 lb 14.5 oz)   SpO2: 98% 95% 97% 96%    Intake/Output Summary (Last 24 hours) at 10/11/13 0955 Last data filed at 10/11/13 [id number]  Gross per 24 hour  Intake     56 ml  Output    325 ml  Net   -269 ml   Filed Weights   10/10/13 1805 10/11/13 0500  Weight: 106.1 kg (233 lb 14.5 oz) 106.1 kg (233 lb 14.5 oz)    Exam:  General: No apparent distress  Eyes: PERRL, EOMI, no scleral icterus  ENT: moist oropharynx  Neck: supple,  mild JVD  Cardiovascular: regular rate without MRG; 2+ peripheral pulses  Respiratory: mild wheezing  Abdomen: soft, distended, non tender to palpation, positive bowel sounds, no guarding, no rebound  Skin: no rashes  Musculoskeletal: trace peripheral edema  Psychiatric: normal mood and affect  Neurologic: non focal   Data Reviewed: Basic Metabolic Panel:  Recent Labs Lab 10/10/13 1204  NA 141  K 3.5  CL 108  CO2 21  GLUCOSE 109*  BUN 25*  CREATININE 1.49*  CALCIUM 8.0*   Liver Function Tests:  Recent Labs Lab 10/10/13 1204  AST 12  ALT 7  ALKPHOS 108  BILITOT 2.1*  PROT 6.1  ALBUMIN 3.1*   No results found for this basename: LIPASE, AMYLASE,  in the last 168 hours No results found for this basename: AMMONIA,  in the last 168 hours CBC:  Recent Labs Lab 10/10/13 1204  WBC 10.8*  HGB 9.3*  HCT 28.4*  MCV 86.1  PLT 111*   Cardiac Enzymes:  Recent Labs Lab 10/10/13 1843 10/10/13 2255 10/11/13 0510  TROPONINI <0.30 <0.30 <0.30   BNP (last 3 results)  Recent Labs  10/10/13 1634  PROBNP 6217.0*   CBG: No results found for this basename: GLUCAP,  in the last 168 hours  Recent Results (from the past 240 hour(s))  MRSA PCR SCREENING     Status: None   Collection Time    10/10/13  6:24 PM      Result Value Range Status   MRSA by PCR NEGATIVE  NEGATIVE Final   Comment:            The GeneXpert MRSA Assay (FDA     approved for NASAL specimens     only), is one component of a     comprehensive MRSA colonization     surveillance program. It is not     intended to diagnose MRSA     infection nor to guide or     monitor treatment for     MRSA infections.     Studies: Ct Abdomen Pelvis Wo Contrast  10/10/2013   CLINICAL DATA:  Vomiting and diarrhea for 3-4 days, generalized weakness, on Coumadin, history is CHF  EXAM: CT ABDOMEN AND PELVIS WITHOUT CONTRAST  TECHNIQUE: Multidetector CT imaging of the abdomen and pelvis was performed following  the standard protocol without intravenous contrast.  COMPARISON:  Lumbar spine CT -06/15/2009  FINDINGS: The lack of intravenous contrast limits the ability to evaluate solid abdominal organs.  There is mild nodularity of the hepatic contour, nonspecific though could be seen in the setting of cirrhosis. This finding is associated with small volume of intra-abdominal ascites. Post cholecystectomy. There is apparent fusiform ectasia of the origin of the portal vein regional to the confluence of the superior mesenteric and splenic veins measuring approximately 2.8 cm  in greatest oblique axial dimension (image 31, series 2).  The bilateral kidneys are slightly atrophic compatible with provided history of renal insufficiency. No discrete renal stones. No urinary obstruction or perinephric stranding. Normal noncontrast appearance of the bilateral adrenal glands. There is a crescentic approximately 1.6 x 1.4 cm presumed partially calcified splenic artery aneurysm (image 26, series 2). Additional smaller presumed splenic aneurysms are seen about the splenic hilum.  Scattered colonic diverticulosis without evidence of diverticulitis. The colon is largely decompressed. The bowel is otherwise normal in course and caliber without discrete area of wall thickening or evidence of obstruction. The appendix is not definitely identified, however there is no definitive inflammatory change within the right lower abdominal quadrant. No pneumoperitoneum, pneumatosis or portal venous gas.  Atherosclerotic plaque within a normal caliber abdominal aorta. Scattered shotty retroperitoneal lymph nodes are individually not enlarged by size criteria. No retroperitoneal, mesenteric, pelvic or inguinal lymphadenopathy on this noncontrast examination.  The pelvic organs her expectantly atrophic. No discrete adnexal lesion.  Limited visualization of the lower thorax demonstrates minimal subsegmental atelectasis within the caudal aspect of the right  middle lobe in an left lower lobe. No focal airspace opacities. No pleural effusion.  Cardiomegaly. Ventricular pacer leads are seen within the right atrium, ventricle and coronary sinus. No pericardial effusion.  No acute or aggressive osseous abnormalities. There is mild straightening expected lumbar lordosis. Grossly unchanged mild (approximately 25%) compression deformity involving the anterior aspect of the superior endplate of the L4 vertebral body. Grossly unchanged mild (approximately 5 mm) of retrolisthesis of L4 upon L5.  IMPRESSION: 1. Colonic diverticulosis without definite evidence of diverticulitis on this noncontrast examination. The colon is decompressed without definite evidence of obstruction. 2. Mild nodularity of the hepatic contour suggestive of cirrhosis. This finding is associated with small volume intra-abdominal ascites. 3. Incidental note made of an approximately 1.6 cm splenic artery aneurysm - this is of doubtful clinical concern in this postmenopausal patient. 4. Suspected fusiform ectasia of the origin of the portal vein, measuring approximately 1.8 cm, incompletely evaluated on this noncontrast examination and of uncertain clinical significant. 5. Cardiomegaly.   Electronically Signed   By: Sandi Mariscal M.D.   On: 10/10/2013 15:48   Dg Chest 2 View  10/10/2013   CLINICAL DATA:  Cough.  EXAM: CHEST  2 VIEW  COMPARISON:  November 29, 2007.  FINDINGS: Stable cardiomegaly. Status post mitral valve replacement. Left-sided pacemaker is unchanged in position. No pneumothorax or pleural effusion is noted. No acute pulmonary disease is noted. Bony thorax is intact.  IMPRESSION: No acute cardiopulmonary abnormality seen.   Electronically Signed   By: Sabino Dick M.D.   On: 10/10/2013 17:04    Scheduled Meds: . cefTRIAXone (ROCEPHIN)  IV  1 g Intravenous Q24H  . furosemide  20 mg Intravenous Once  . HYDROcodone-homatropine  5 mL Oral BID  . sodium chloride  3 mL Intravenous Q12H    Continuous Infusions:   Active Problems:   Chronic anticoagulation   Liver cirrhosis   Thrombocytopenia   Nausea and vomiting   CHF, acute on chronic   UTI (urinary tract infection)   CKD (chronic kidney disease) stage 3, GFR 30-59 ml/min   Anemia  Time spent: [place], MD [facility] Pager [phone]. If 7 PM - 7 AM, please contact night-coverage at [email or web address], password [facility] 10/11/2013, 9:55 AM  LOS: 1 day

## 2013-10-11 NOTE — Consult Note (Signed)
Admit date: 10/10/2013 Referring Physician  Marzetta Board Primary Physician No primary provider on file. Primary Cardiologist  Turner Reason for Consultation  CHF  HPI: 79 year-old female with prior history of left ventricular systolic dysfunction/chronic systolic/diastolic heart failure ejection fraction of 45-50% in November 2007, status post mitral valve repair in 2003, atrial fibrillation previously on Tikosyn, pacemaker implantation on chronic anticoagulation with Coumadin who presented with chief complaint of weakness/nausea/vomiting over the past 3 days. No diarrhea. No syncope or lightheadedness. She has had some increasing abdominal swelling over the past few weeks. She has gained 15 pounds. Just complains of some cough as well as mild shortness of breath. No fevers.  An echocardiogram was done today with challenging windows. Ejection fraction was estimated at approximately 35-40% with possible anteroseptal hypokinesis.  She also has a history of cirrhosis, ascites, thrombocytopenia secondary to liver disease.    PMH:   Past Medical History  Diagnosis Date  . CHF (congestive heart failure)   . Hypertension   . Renal disorder     PSH:   Past Surgical History  Procedure Laterality Date  . Valve replacement    . Cholecystectomy    . Appendectomy     Allergies:  Review of patient's allergies indicates no known allergies.  Current scheduled medications: . cefTRIAXone (ROCEPHIN)  IV  1 g Intravenous Q24H  . HYDROcodone-homatropine  5 mL Oral BID  . sodium chloride  3 mL Intravenous Q12H   Prior to Admit Meds:   Prior to Admission medications   Medication Sig Start Date End Date Taking? Authorizing Provider  acetaminophen (TYLENOL) 500 MG tablet Take 500 mg by mouth every 6 (six) hours as needed for mild pain.   Yes Historical Provider, MD  beta carotene w/minerals (OCUVITE) tablet Take 1 tablet by mouth daily.   Yes Historical Provider, MD  carvedilol (COREG) 12.5 MG  tablet Take 12.5 mg by mouth 2 (two) times daily with a meal.   Yes Historical Provider, MD  cetirizine (ZYRTEC) 10 MG tablet Take 10 mg by mouth daily.   Yes Historical Provider, MD  colchicine 0.6 MG tablet Take 0.6 mg by mouth daily.   Yes Historical Provider, MD  digoxin (LANOXIN) 0.125 MG tablet Take 0.0625 mg by mouth daily.   Yes Historical Provider, MD  ferrous sulfate 325 (65 FE) MG tablet Take 325 mg by mouth daily with breakfast.   Yes Historical Provider, MD  furosemide (LASIX) 20 MG tablet Take 20-40 mg by mouth daily as needed (for swelling).   Yes Historical Provider, MD  Oxymetazoline HCl (NOSTRILLA CONGESTION RELIEF NA) Place 1 spray into the nose daily as needed (for congestion).   Yes Historical Provider, MD  potassium chloride SA (K-DUR,KLOR-CON) 20 MEQ tablet Take 20 mEq by mouth daily.   Yes Historical Provider, MD  warfarin (COUMADIN) 5 MG tablet Take 2.5-5 mg by mouth daily. Take 2.5mg  daily, except take 5mg  on Mondays.   Yes Historical Provider, MD   Fam HX:   No family history on file. uncontributory Social HX:    History   Social History  . Marital Status: Single    Spouse Name: N/A    Number of Children: N/A  . Years of Education: N/A   Occupational History  . Not on file.   Social History Main Topics  . Smoking status: Former Smoker    Quit date: 10/11/1971  . Smokeless tobacco: Not on file  . Alcohol Use: Yes     Comment:  occational  . Drug Use: Not on file  . Sexual Activity: Not on file   Other Topics Concern  . Not on file   Social History Narrative  . No narrative on file     ROS:  All 11 ROS were addressed and are negative except what is stated in the HPI   Physical Exam: Blood pressure 101/48, pulse 70, temperature 98.5 F (36.9 C), temperature source Oral, resp. rate 24, height 5\' 3"  (1.6 m), weight 233 lb 14.5 oz (106.1 kg), last menstrual period 10/10/2013, SpO2 99.00%.   General: Well developed, well nourished, in no acute  distress Head: Eyes PERRLA, No xanthomas.   Normal cephalic and atramatic  Lungs:   Clear bilaterally to auscultation and percussion. Normal respiratory effort. No wheezes, no rales. Heart:   HRRR S1 S2 Pulses are 2+ & equal. No murmur, rubs, gallops.  No carotid bruit. No JVD.  No abdominal bruits.  Abdomen: Protuberant, Bowel sounds are positive, abdomen soft and non-tender without masses. No hepatosplenomegaly. Msk:  Back normal. Normal strength and tone for age. Extremities:  No clubbing, cyanosis or edema.  DP +1 Neuro: Alert and oriented X 3, non-focal, MAE x 4 GU: Deferred Rectal: Deferred Psych:  Good affect, responds appropriately      Labs: Lab Results  Component Value Date   WBC 11.7* 10/11/2013   HGB 10.0* 10/11/2013   HCT 30.6* 10/11/2013   MCV 86.7 10/11/2013   PLT 124* 10/11/2013     Recent Labs Lab 10/10/13 1204 10/11/13 1149  NA 141 139  K 3.5 3.8  CL 108 104  CO2 21 22  BUN 25* 35*  CREATININE 1.49* 1.90*  CALCIUM 8.0* 8.0*  PROT 6.1  --   BILITOT 2.1*  --   ALKPHOS 108  --   ALT 7  --   AST 12  --   GLUCOSE 109* 133*    Recent Labs  10/10/13 1843 10/10/13 2255 10/11/13 0510  TROPONINI <0.30 <0.30 <0.30   BNP (last 3 results)  Recent Labs  10/10/13 1634  PROBNP 6217.0*     Radiology:  Ct Abdomen Pelvis Wo Contrast  10/10/2013   CLINICAL DATA:  Vomiting and diarrhea for 3-4 days, generalized weakness, on Coumadin, history is CHF  EXAM: CT ABDOMEN AND PELVIS WITHOUT CONTRAST  TECHNIQUE: Multidetector CT imaging of the abdomen and pelvis was performed following the standard protocol without intravenous contrast.  COMPARISON:  Lumbar spine CT -06/15/2009  FINDINGS: The lack of intravenous contrast limits the ability to evaluate solid abdominal organs.  There is mild nodularity of the hepatic contour, nonspecific though could be seen in the setting of cirrhosis. This finding is associated with small volume of intra-abdominal ascites. Post  cholecystectomy. There is apparent fusiform ectasia of the origin of the portal vein regional to the confluence of the superior mesenteric and splenic veins measuring approximately 2.8 cm in greatest oblique axial dimension (image 31, series 2).  The bilateral kidneys are slightly atrophic compatible with provided history of renal insufficiency. No discrete renal stones. No urinary obstruction or perinephric stranding. Normal noncontrast appearance of the bilateral adrenal glands. There is a crescentic approximately 1.6 x 1.4 cm presumed partially calcified splenic artery aneurysm (image 26, series 2). Additional smaller presumed splenic aneurysms are seen about the splenic hilum.  Scattered colonic diverticulosis without evidence of diverticulitis. The colon is largely decompressed. The bowel is otherwise normal in course and caliber without discrete area of wall thickening or evidence of obstruction. The  appendix is not definitely identified, however there is no definitive inflammatory change within the right lower abdominal quadrant. No pneumoperitoneum, pneumatosis or portal venous gas.  Atherosclerotic plaque within a normal caliber abdominal aorta. Scattered shotty retroperitoneal lymph nodes are individually not enlarged by size criteria. No retroperitoneal, mesenteric, pelvic or inguinal lymphadenopathy on this noncontrast examination.  The pelvic organs her expectantly atrophic. No discrete adnexal lesion.  Limited visualization of the lower thorax demonstrates minimal subsegmental atelectasis within the caudal aspect of the right middle lobe in an left lower lobe. No focal airspace opacities. No pleural effusion.  Cardiomegaly. Ventricular pacer leads are seen within the right atrium, ventricle and coronary sinus. No pericardial effusion.  No acute or aggressive osseous abnormalities. There is mild straightening expected lumbar lordosis. Grossly unchanged mild (approximately 25%) compression deformity  involving the anterior aspect of the superior endplate of the L4 vertebral body. Grossly unchanged mild (approximately 5 mm) of retrolisthesis of L4 upon L5.  IMPRESSION: 1. Colonic diverticulosis without definite evidence of diverticulitis on this noncontrast examination. The colon is decompressed without definite evidence of obstruction. 2. Mild nodularity of the hepatic contour suggestive of cirrhosis. This finding is associated with small volume intra-abdominal ascites. 3. Incidental note made of an approximately 1.6 cm splenic artery aneurysm - this is of doubtful clinical concern in this postmenopausal patient. 4. Suspected fusiform ectasia of the origin of the portal vein, measuring approximately 1.8 cm, incompletely evaluated on this noncontrast examination and of uncertain clinical significant. 5. Cardiomegaly.   Electronically Signed   By: Sandi Mariscal M.D.   On: 10/10/2013 15:48   Dg Chest 2 View  10/10/2013   CLINICAL DATA:  Cough.  EXAM: CHEST  2 VIEW  COMPARISON:  November 29, 2007.  FINDINGS: Stable cardiomegaly. Status post mitral valve replacement. Left-sided pacemaker is unchanged in position. No pneumothorax or pleural effusion is noted. No acute pulmonary disease is noted. Bony thorax is intact.  IMPRESSION: No acute cardiopulmonary abnormality seen.   Electronically Signed   By: Sabino Dick M.D.   On: 10/10/2013 17:04   Personally viewed.  EKG:  V. paced Personally viewed.   ASSESSMENT/PLAN:   79 year old female with prior mitral valve repair, previously described ejection fraction of 45% with cirrhosis of the liver, thrombocytopenia, abdominal discomfort, with new echocardiogram demonstrating possible ejection fraction of 35-40%.  1. Acute on chronic systolic/diastolic heart failure-I personally reviewed her echocardiogram and the left ventricular windows were quite challenging. Ejection fraction of 35-40% was a gross estimation however there did appear to be dysfunction present. Her  mitral valve repair is intact.  Currently, it is challenging for her to resume her carvedilol 12.5 mg twice a day because of hypotension. Her most recent blood pressure is 101/48. She has also been on digoxin which in the setting of her chronic kidney disease currently, is currently being held. Her BNP elevation of 6200 is noted. Troponins are normal. Creatinine is now slightly elevated from admission of 1.49 up to 1.9 currently. This may be in part secondary to hypotension as well as possibly reduced cardiac output.   With her recent 15 pound weight gain, ascites, I will actually give her a dose of lasix 40mg IV to see if this pushes her back on the Starling curve and may overall help her output.   Continue now with supportive care  Candee Furbish, MD  10/11/2013  2:16 PM

## 2013-10-11 NOTE — Progress Notes (Signed)
Utilization Review Completed.  

## 2013-10-11 NOTE — Progress Notes (Signed)
  Echocardiogram 2D Echocardiogram has been performed.  Basilia Jumbo 10/11/2013, 8:51 AM

## 2013-10-12 ENCOUNTER — Encounter (HOSPITAL_COMMUNITY): Payer: Self-pay

## 2013-10-12 DIAGNOSIS — L02219 Cutaneous abscess of trunk, unspecified: Secondary | ICD-10-CM

## 2013-10-12 DIAGNOSIS — L03319 Cellulitis of trunk, unspecified: Secondary | ICD-10-CM

## 2013-10-12 DIAGNOSIS — D649 Anemia, unspecified: Secondary | ICD-10-CM

## 2013-10-12 LAB — HEPATITIS PANEL, ACUTE
HCV Ab: NEGATIVE
Hep A IgM: NONREACTIVE
Hep B C IgM: NONREACTIVE

## 2013-10-12 LAB — BASIC METABOLIC PANEL
BUN: 44 mg/dL — ABNORMAL HIGH (ref 6–23)
Calcium: 8.1 mg/dL — ABNORMAL LOW (ref 8.4–10.5)
Chloride: 103 mEq/L (ref 96–112)
Creatinine, Ser: 2.12 mg/dL — ABNORMAL HIGH (ref 0.50–1.10)
GFR calc Af Amer: 23 mL/min — ABNORMAL LOW (ref >90)
GFR calc non Af Amer: 20 mL/min — ABNORMAL LOW (ref >90)
Glucose, Bld: 83 mg/dL (ref 70–99)
Potassium: 3.8 mEq/L (ref 3.5–5.1)
Sodium: 138 mEq/L (ref 135–145)

## 2013-10-12 LAB — PROTIME-INR
INR: 4.63 — ABNORMAL HIGH (ref 0.00–1.49)
Prothrombin Time: 41.9 seconds — ABNORMAL HIGH (ref 11.6–15.2)

## 2013-10-12 LAB — URINE CULTURE: Colony Count: >=100000

## 2013-10-12 LAB — CBC
HCT: 29.8 % — ABNORMAL LOW (ref 36.0–46.0)
Hemoglobin: 9.6 g/dL — ABNORMAL LOW (ref 12.0–15.0)
MCH: 27.5 pg (ref 26.0–34.0)
MCHC: 32.2 g/dL (ref 30.0–36.0)
Platelets: 135 10*3/uL — ABNORMAL LOW (ref 150–400)
WBC: 11.7 10*3/uL — ABNORMAL HIGH (ref 4.0–10.5)

## 2013-10-12 LAB — CLOSTRIDIUM DIFFICILE BY PCR: Toxigenic C. Difficile by PCR: NEGATIVE

## 2013-10-12 MED ORDER — PIPERACILLIN-TAZOBACTAM IN DEX 2-0.25 GM/50ML IV SOLN
2.2500 g | Freq: Four times a day (QID) | INTRAVENOUS | Status: DC
  Administered 2013-10-12 – 2013-10-15 (×13): 2.25 g via INTRAVENOUS
  Filled 2013-10-12 (×17): qty 50

## 2013-10-12 MED ORDER — VANCOMYCIN HCL 10 G IV SOLR
2000.0000 mg | Freq: Once | INTRAVENOUS | Status: AC
  Administered 2013-10-12: 2000 mg via INTRAVENOUS
  Filled 2013-10-12: qty 2000

## 2013-10-12 MED ORDER — VANCOMYCIN HCL 10 G IV SOLR
1500.0000 mg | INTRAVENOUS | Status: DC
  Filled 2013-10-12 (×2): qty 1500

## 2013-10-12 MED ORDER — VITAMIN K1 10 MG/ML IJ SOLN
5.0000 mg | Freq: Once | INTRAVENOUS | Status: AC
  Administered 2013-10-12: 5 mg via INTRAVENOUS
  Filled 2013-10-12: qty 0.5

## 2013-10-12 MED ORDER — FUROSEMIDE 10 MG/ML IJ SOLN
20.0000 mg | Freq: Once | INTRAMUSCULAR | Status: AC
  Administered 2013-10-12: 20 mg via INTRAVENOUS
  Filled 2013-10-12: qty 2

## 2013-10-12 NOTE — Progress Notes (Signed)
SUBJECTIVE:  Still complains of increased abdominal swelling.  No SOB  OBJECTIVE:   Vitals:   Filed Vitals:   10/12/13 1745 10/12/13 1800 10/12/13 1815 10/12/13 2000  BP: 101/42 77/59 97/42  106/49  Pulse: 72 71 69 71  Temp:    98.9 F (37.2 C)  TempSrc:    Oral  Resp: 24 24 23    Height:      Weight:      SpO2: 97% 96% 97% 99%   I&O's:   Intake/Output Summary (Last 24 hours) at 10/12/13 2133 Last data filed at 10/12/13 1800  Gross per 24 hour  Intake    583 ml  Output    100 ml  Net    483 ml   TELEMETRY: Reviewed telemetry pt in NSR     PHYSICAL EXAM General: Well developed, well nourished, in no acute distress Head: Eyes PERRLA, No xanthomas.   Normal cephalic and atramatic  Lungs:   Clear bilaterally to auscultation and percussion. Heart:  HRRR S1 S2 Pulses are 2+ & equal.            No carotid bruit. No JVD.  Abdomen: Bowel sounds are positive, abdomen distended Extremities:  No clubbing, cyanosis or edema.  DP +1 Neuro: Alert and oriented X 3. Psych:  Good affect, responds appropriately   LABS: Basic Metabolic Panel:  Recent Labs  10/11/13 1149 10/12/13 0420  NA 139 138  K 3.8 3.8  CL 104 103  CO2 22 21  GLUCOSE 133* 83  BUN 35* 44*  CREATININE 1.90* 2.12*  CALCIUM 8.0* 8.1*  MG 2.1  --    Liver Function Tests:  Recent Labs  10/10/13 1204  AST 12  ALT 7  ALKPHOS 108  BILITOT 2.1*  PROT 6.1  ALBUMIN 3.1*   No results found for this basename: LIPASE, AMYLASE,  in the last 72 hours CBC:  Recent Labs  10/11/13 1149 10/12/13 0420  WBC 11.7* 11.7*  HGB 10.0* 9.6*  HCT 30.6* 29.8*  MCV 86.7 85.4  PLT 124* 135*   Cardiac Enzymes:  Recent Labs  10/10/13 1843 10/10/13 2255 10/11/13 0510  TROPONINI <0.30 <0.30 <0.30   BNP: No components found with this basename: POCBNP,  D-Dimer: No results found for this basename: DDIMER,  in the last 72 hours Hemoglobin A1C: No results found for this basename: HGBA1C,  in the last 72  hours Fasting Lipid Panel: No results found for this basename: CHOL, HDL, LDLCALC, TRIG, CHOLHDL, LDLDIRECT,  in the last 72 hours Thyroid Function Tests: No results found for this basename: TSH, T4TOTAL, FREET3, T3FREE, THYROIDAB,  in the last 72 hours Anemia Panel: No results found for this basename: VITAMINB12, FOLATE, FERRITIN, TIBC, IRON, RETICCTPCT,  in the last 72 hours Coag Panel:   Lab Results  Component Value Date   INR 4.63* 10/12/2013   INR 4.91* 10/11/2013   INR 3.37* 10/10/2013    RADIOLOGY: Ct Abdomen Pelvis Wo Contrast  10/10/2013   CLINICAL DATA:  Vomiting and diarrhea for 3-4 days, generalized weakness, on Coumadin, history is CHF  EXAM: CT ABDOMEN AND PELVIS WITHOUT CONTRAST  TECHNIQUE: Multidetector CT imaging of the abdomen and pelvis was performed following the standard protocol without intravenous contrast.  COMPARISON:  Lumbar spine CT -06/15/2009  FINDINGS: The lack of intravenous contrast limits the ability to evaluate solid abdominal organs.  There is mild nodularity of the hepatic contour, nonspecific though could be seen in the setting of cirrhosis. This finding is associated with  small volume of intra-abdominal ascites. Post cholecystectomy. There is apparent fusiform ectasia of the origin of the portal vein regional to the confluence of the superior mesenteric and splenic veins measuring approximately 2.8 cm in greatest oblique axial dimension (image 31, series 2).  The bilateral kidneys are slightly atrophic compatible with provided history of renal insufficiency. No discrete renal stones. No urinary obstruction or perinephric stranding. Normal noncontrast appearance of the bilateral adrenal glands. There is a crescentic approximately 1.6 x 1.4 cm presumed partially calcified splenic artery aneurysm (image 26, series 2). Additional smaller presumed splenic aneurysms are seen about the splenic hilum.  Scattered colonic diverticulosis without evidence of diverticulitis.  The colon is largely decompressed. The bowel is otherwise normal in course and caliber without discrete area of wall thickening or evidence of obstruction. The appendix is not definitely identified, however there is no definitive inflammatory change within the right lower abdominal quadrant. No pneumoperitoneum, pneumatosis or portal venous gas.  Atherosclerotic plaque within a normal caliber abdominal aorta. Scattered shotty retroperitoneal lymph nodes are individually not enlarged by size criteria. No retroperitoneal, mesenteric, pelvic or inguinal lymphadenopathy on this noncontrast examination.  The pelvic organs her expectantly atrophic. No discrete adnexal lesion.  Limited visualization of the lower thorax demonstrates minimal subsegmental atelectasis within the caudal aspect of the right middle lobe in an left lower lobe. No focal airspace opacities. No pleural effusion.  Cardiomegaly. Ventricular pacer leads are seen within the right atrium, ventricle and coronary sinus. No pericardial effusion.  No acute or aggressive osseous abnormalities. There is mild straightening expected lumbar lordosis. Grossly unchanged mild (approximately 25%) compression deformity involving the anterior aspect of the superior endplate of the L4 vertebral body. Grossly unchanged mild (approximately 5 mm) of retrolisthesis of L4 upon L5.  IMPRESSION: 1. Colonic diverticulosis without definite evidence of diverticulitis on this noncontrast examination. The colon is decompressed without definite evidence of obstruction. 2. Mild nodularity of the hepatic contour suggestive of cirrhosis. This finding is associated with small volume intra-abdominal ascites. 3. Incidental note made of an approximately 1.6 cm splenic artery aneurysm - this is of doubtful clinical concern in this postmenopausal patient. 4. Suspected fusiform ectasia of the origin of the portal vein, measuring approximately 1.8 cm, incompletely evaluated on this noncontrast  examination and of uncertain clinical significant. 5. Cardiomegaly.   Electronically Signed   By: Sandi Mariscal M.D.   On: 10/10/2013 15:48   Dg Chest 2 View  10/10/2013   CLINICAL DATA:  Cough.  EXAM: CHEST  2 VIEW  COMPARISON:  November 29, 2007.  FINDINGS: Stable cardiomegaly. Status post mitral valve replacement. Left-sided pacemaker is unchanged in position. No pneumothorax or pleural effusion is noted. No acute pulmonary disease is noted. Bony thorax is intact.  IMPRESSION: No acute cardiopulmonary abnormality seen.   Electronically Signed   By: Sabino Dick M.D.   On: 10/10/2013 17:04    ASSESSMENT/PLAN:  79 year old female with prior mitral valve repair, previously described ejection fraction of 45% with cirrhosis of the liver, thrombocytopenia, abdominal discomfort, with new echocardiogram demonstrating possible ejection fraction of 35-40%.  1. Acute on chronic systolic/diastolic heart failure- Ejection fraction of 35-40% was a gross estimation however there did appear to be dysfunction present. Her mitral valve repair is intact on echo.  Her creatinine bumped with IV Lasix yesterday and she still has borderline hypotension.  Her lungs are clear on exam with no LE edema and chest xray with no CHF.  At this time would not give any  Lasix today  2.  Cirrhosis with ascites.  Her abdomen is distended and she has had diarrhea.  Cirrhosis could be due to CHF or other GI etiology. 3.  Perirectal abcess requiring drainage in am. 4.  Severe MR S/P MV repair with Carpentier ring annuloplasty 5.  PAF on chronic anticoagulation - coumadin reversed for surgery tomorrow 6.  CKD stage III    Sueanne Margarita, MD  10/12/2013  9:33 PM

## 2013-10-12 NOTE — Consult Note (Signed)
She has a fairly extensive left labial and left perirectal abscess that needs to be drained.  The patient just ate and her INR is significantly elevated.  Will give FFP, Lasix,a nd vit K and prepare patient for I&D in the OR tomorrow.  Kathryne Eriksson. Dahlia Bailiff, MD, [place] [phone] [facility]

## 2013-10-12 NOTE — Progress Notes (Signed)
PT Cancellation Note  Patient Details Name: [name] MRN: [id number] DOB: [birth year]   Cancelled Treatment:    Reason Eval/Treat Not Completed: Fatigue/lethargy limiting ability to participate.  Nursing just got pt back to bed and states pt is moving around fairly well.  Will evaluate Friday as pt has procedure planned for tomorrow.     INGOLD,Daivd Fredericksen 10/12/2013, 1:18 PM  [facility] [phone] (pager)

## 2013-10-12 NOTE — Clinical Documentation Improvement (Signed)
Possible Clinical Conditions?       Anemia due to chronic disease      Aplastic anemia      Chronic blood loss anemia      Other Condition  Supporting information: H&H on admission:  9.3 / 28.4 H&H today: 9.6 / 29.8   Thank You,  Posey Pronto, RN, BSN, [place] Documentation Improvement Specialist HIM department--[place] Office [phone]

## 2013-10-12 NOTE — Consult Note (Signed)
[facility] CONS[facility] Reason for consult: Abnormal CT Scan Referring Physician: Triad [name]. PCP: Dr. Delfina [name]. Cardiologist: Dr. Romero Liner [name] is an 79 y.o. female.  HPI: she has past medical history of congestive heart failure and has been followed by Dr. Radford [name] for a history of congestive heart failure with ejection fraction of around 40%. She has mitral valve disease has undergone prior mitral valve replacement and is on chronic anticoagulation. She's had swelling of her legs, abdomen and they dizziness and nausea for the past several days and was admitted from the emergency room. CT of the abdomen revealed diverticulosis was slightly irregular liver could be due to early cirrhosis. Diverticulosis without diverticulitis. [name] no gross intra-abdominal fluid with the possibility of very small amount of ascites. Patients labs have revealed total bilirubin 2.1 normal transaminases and albumin 3.1. She was found to have perirectal abscess is going to have been trained tomorrow. The patient states that she has had fairly loose stools is not been constipated. There are family members who Crohn's disease. She has never had colonoscopy. We're asked to see her about her possible liver disease. She has no known prior history of liver disease. She has had family history of Crohn's disease and cousins but no family history liver disease. She is a non-drinker. She does use Tylenol fairly regular basis but is never had any acute injury. She has been found to have a urinary tract infection and has been started on antibiotics.  Past Medical History  Diagnosis Date  . CHF (congestive heart failure)   . Hypertension   . Renal disorder     Past Surgical History  Procedure Laterality Date  . Valve replacement    . Cholecystectomy    . Appendectomy      No family history on file.  Social History:  reports that she quit smoking about 42  years ago. She does not have any smokeless tobacco history on file. She reports that she drinks alcohol. Her drug history is not on file.  Allergies: No Known Allergies  Medications; Prior to Admission medications   Medication Sig Start Date End Date Taking? Authorizing Provider  acetaminophen (TYLENOL) 500 MG tablet Take 500 mg by mouth every 6 (six) hours as needed for mild pain.   Yes Historical Provider, MD  beta carotene w/minerals (OCUVITE) tablet Take 1 tablet by mouth daily.   Yes Historical Provider, MD  carvedilol (COREG) 12.5 MG tablet Take 12.5 mg by mouth 2 (two) times daily with a meal.   Yes Historical Provider, MD  cetirizine (ZYRTEC) 10 MG tablet Take 10 mg by mouth daily.   Yes Historical Provider, MD  colchicine 0.6 MG tablet Take 0.6 mg by mouth daily.   Yes Historical Provider, MD  digoxin (LANOXIN) 0.125 MG tablet Take 0.0625 mg by mouth daily.   Yes Historical Provider, MD  ferrous sulfate 325 (65 FE) MG tablet Take 325 mg by mouth daily with breakfast.   Yes Historical Provider, MD  furosemide (LASIX) 20 MG tablet Take 20-40 mg by mouth daily as needed (for swelling).   Yes Historical Provider, MD  Oxymetazoline HCl (NOSTRILLA CONGESTION RELIEF NA) Place 1 spray into the nose daily as needed (for congestion).   Yes Historical Provider, MD  potassium chloride SA (K-DUR,KLOR-CON) 20 MEQ tablet Take 20 mEq by mouth daily.   Yes Historical Provider, MD  warfarin (COUMADIN) 5 MG tablet Take 2.5-5 mg by mouth daily. Take  2.5mg  daily, except take 5mg  on Mondays.   Yes Historical Provider, MD   . furosemide  20 mg Intravenous Once  . HYDROcodone-homatropine  5 mL Oral BID  . phytonadione (VITAMIN K) IV  5 mg Intravenous Once  . piperacillin-tazobactam (ZOSYN)  IV  2.25 g Intravenous Q6H  . sodium chloride  3 mL Intravenous Q12H  . [START ON 10/14/2013] vancomycin  1,500 mg Intravenous Q48H   PRN Meds albuterol, loperamide, ondansetron, traMADol Results for orders placed  during the hospital encounter of 10/10/13 (from the past 48 hour(s))  MRSA PCR SCREENING     Status: None   Collection Time    10/10/13  6:24 PM      Result Value Range   MRSA by PCR NEGATIVE  NEGATIVE   Comment:            The GeneXpert MRSA Assay (FDA     approved for NASAL specimens     [name]), is one component of a     comprehensive MRSA colonization     surveillance program. It is not     intended to diagnose MRSA     infection nor to guide or     monitor treatment for     MRSA infections.  DIGOXIN LEVEL     Status: Abnormal   Collection Time    10/10/13  6:43 PM      Result Value Range   Digoxin Level 0.6 (*) 0.8 - 2.0 ng/mL  TROPONIN I     Status: None   Collection Time    10/10/13  6:43 PM      Result Value Range   Troponin I <0.30  <0.30 ng/mL   Comment:            Due to the release kinetics of cTnI,     a negative result within the first hours     of the onset of symptoms does not rule out     myocardial infarction with certainty.     If myocardial infarction is still suspected,     repeat the test at appropriate intervals.  TROPONIN I     Status: None   Collection Time    10/10/13 10:55 PM      Result Value Range   Troponin I <0.30  <0.30 ng/mL   Comment:            Due to the release kinetics of cTnI,     a negative result within the first hours     of the onset of symptoms does not rule out     myocardial infarction with certainty.     If myocardial infarction is still suspected,     repeat the test at appropriate intervals.  TROPONIN I     Status: None   Collection Time    10/11/13  5:10 AM      Result Value Range   Troponin I <0.30  <0.30 ng/mL   Comment:            Due to the release kinetics of cTnI,     a negative result within the first hours     of the onset of symptoms does not rule out     myocardial infarction with certainty.     If myocardial infarction is still suspected,     repeat the test at appropriate intervals.  BASIC METABOLIC  PANEL     Status: Abnormal   Collection Time    10/11/13 11:49  AM      Result Value Range   Sodium 139  135 - 145 mEq/[name]   Potassium 3.8  3.5 - 5.1 mEq/[name]   Chloride 104  96 - 112 mEq/[name]   CO2 22  19 - 32 mEq/[name]   Glucose, Bld 133 (*) 70 - 99 mg/dL   BUN 35 (*) 6 - 23 mg/dL   Creatinine, Ser 1.90 (*) 0.50 - 1.10 mg/dL   Calcium 8.0 (*) 8.4 - 10.5 mg/dL   GFR calc non Af Amer 23 (*) >90 mL/min   GFR calc Af Amer 27 (*) >90 mL/min   Comment: (NOTE)     The eGFR has been calculated using the CKD EPI equation.     This calculation has not been validated in all clinical situations.     eGFR's persistently <90 mL/min signify possible Chronic Kidney     Disease.  CBC     Status: Abnormal   Collection Time    10/11/13 11:49 AM      Result Value Range   WBC 11.7 (*) 4.0 - 10.5 K/uL   RBC 3.53 (*) 3.87 - 5.11 MIL/uL   Hemoglobin 10.0 (*) 12.0 - 15.0 g/dL   HCT 30.6 (*) 36.0 - 46.0 %   MCV 86.7  78.0 - 100.0 fL   MCH 28.3  26.0 - 34.0 pg   MCHC 32.7  30.0 - 36.0 g/dL   RDW 15.7 (*) 11.5 - 15.5 %   Platelets 124 (*) 150 - 400 K/uL  MAGNESIUM     Status: None   Collection Time    10/11/13 11:49 AM      Result Value Range   Magnesium 2.1  1.5 - 2.5 mg/dL  PROTIME-INR     Status: Abnormal   Collection Time    10/11/13 11:49 AM      Result Value Range   Prothrombin Time 43.8 (*) 11.6 - 15.2 seconds   INR 4.91 (*) 0.00 - 1.49  CLOSTRIDIUM DIFFICILE BY PCR     Status: None   Collection Time    10/11/13  8:38 PM      Result Value Range   C difficile by pcr NEGATIVE  NEGATIVE  PROTIME-INR     Status: Abnormal   Collection Time    10/12/13  4:20 AM      Result Value Range   Prothrombin Time 41.9 (*) 11.6 - 15.2 seconds   INR 4.63 (*) 0.00 - 1.49  CBC     Status: Abnormal   Collection Time    10/12/13  4:20 AM      Result Value Range   WBC 11.7 (*) 4.0 - 10.5 K/uL   Comment: WHITE COUNT CONFIRMED ON SMEAR   RBC 3.49 (*) 3.87 - 5.11 MIL/uL   Hemoglobin 9.6 (*) 12.0 - 15.0 g/dL    HCT 29.8 (*) 36.0 - 46.0 %   MCV 85.4  78.0 - 100.0 fL   MCH 27.5  26.0 - 34.0 pg   MCHC 32.2  30.0 - 36.0 g/dL   RDW 15.8 (*) 11.5 - 15.5 %   Platelets 135 (*) 150 - 400 K/uL   Comment: PLATELET CLUMPS NOTED ON SMEAR  BASIC METABOLIC PANEL     Status: Abnormal   Collection Time    10/12/13  4:20 AM      Result Value Range   Sodium 138  135 - 145 mEq/[name]   Potassium 3.8  3.5 - 5.1 mEq/[name]   Chloride 103  96 - 112 mEq/[name]   CO2 21  19 - 32 mEq/[name]   Glucose, Bld 83  70 - 99 mg/dL   BUN 44 (*) 6 - 23 mg/dL   Creatinine, Ser 2.12 (*) 0.50 - 1.10 mg/dL   Calcium 8.1 (*) 8.4 - 10.5 mg/dL   GFR calc non Af Amer 20 (*) >90 mL/min   GFR calc Af Amer 23 (*) >90 mL/min   Comment: (NOTE)     The eGFR has been calculated using the CKD EPI equation.     This calculation has not been validated in all clinical situations.     eGFR's persistently <90 mL/min signify possible Chronic Kidney     Disease.  PREPARE FRESH FROZEN PLASMA     Status: None   Collection Time    10/12/13 12:48 PM      Result Value Range   Unit Number [id number]     Blood Component Type THAWED PLASMA     Unit division 00     Status of Unit ISSUED     Transfusion Status OK TO TRANSFUSE     Unit Number [id number]     Blood Component Type THAWED PLASMA     Unit division 00     Status of Unit ISSUED     Transfusion Status OK TO TRANSFUSE      Dg Chest 2 View  10/10/2013   CLINICAL DATA:  Cough.  EXAM: CHEST  2 VIEW  COMPARISON:  November 29, 2007.  FINDINGS: Stable cardiomegaly. Status post mitral valve replacement. Left-sided pacemaker is unchanged in position. No pneumothorax or pleural effusion is noted. No acute pulmonary disease is noted. Bony thorax is intact.  IMPRESSION: No acute cardiopulmonary abnormality seen.   Electronically Signed   By: Sabino Dick M.D.   On: 10/10/2013 17:04               Blood pressure 112/53, pulse 71, temperature 98.8 F (37.1 C), temperature source Oral, resp. rate 18,  height 5\' 3"  (1.6 m), weight 106 kg (233 lb 11 oz), last menstrual period 10/10/2013, SpO2 97.00%.  Physical exam:   General-- obese white female sitting in the chair Heart-- regular rate and rhythm heart sounds fairly faint due to obesity Lungs--grossly clear Abdomen-- markedly obese and nontender with good bowel sounds. No gross shifting dullness   Assessment: 1. Possible of cirrhosis. Slightly irregular liver and slight elevation bilirubin. Patient could have cirrhosis due to NASH. At this point, I don't think any extensive diagnostic workup would be appropriate. She has slight ascites but also has a history of fairly significant heart failure and is chronically anticoagulant. 2. Congestive heart failure with ejection fraction around 40% 3. Status post mitral valve a new' on chronic anticoagulation 4. Perirectal abscess. She does have a family history of Crohn's disease is never had any problem until recently. I doubt that she has chronic Crohn's disease.  Plan: I will follow with you. I have discussed fatty liver with patient and her daughter. I don't think any extensive diagnostic testing is warranted her age. We'll see how she does with her bowel movements after the perirectal abscess is been drained. Unless you're some other reason I don't feel that extensive workup for Crohn's disease would be appropriate.   [name],[name] 10/12/2013, 4:37 PM

## 2013-10-12 NOTE — Consult Note (Signed)
[name] [birth year]  [id number].   Requesting MD: Dr. Niel Hummer Chief Complaint/Reason for Consult: perineal abscess HPI: This is a 79 yo white female with multiple medical problems who began having some nausea and vomiting last week.  She felt weak and "just not right."  She noted some perineal pain and swelling of her mons and labia on Friday.  She came to the [facility] due to the first c/o nausea and vomiting.  She was admitted.  She had a CT scan which did not demonstrate a reason for her n/v, but did show evidence of unknown cirrhotic diease.  The patient was noted today to have a perirectal abscess extending into her vulva.  She was placed on Vanc and Zosyn and we were asked to see her for further evaluation.  ROS: please see HPI, otherwise, she has chronic back pain that she takes Tylenol daily for for over 10 years.   No family history on file.  Past Medical History  Diagnosis Date  . CHF (congestive heart failure)   . Hypertension   . Renal disorder     Past Surgical History  Procedure Laterality Date  . Valve replacement    . Cholecystectomy    . Appendectomy      Social History:  reports that she quit smoking about 42 years ago. She does not have any smokeless tobacco history on file. She reports that she drinks alcohol. Her drug history is not on file.  Allergies: No Known Allergies  Medications Prior to Admission  Medication Sig Dispense Refill  . acetaminophen (TYLENOL) 500 MG tablet Take 500 mg by mouth every 6 (six) hours as needed for mild pain.      . beta carotene w/minerals (OCUVITE) tablet Take 1 tablet by mouth daily.      . carvedilol (COREG) 12.5 MG tablet Take 12.5 mg by mouth 2 (two) times daily with a meal.      . cetirizine (ZYRTEC) 10 MG tablet Take 10 mg by mouth daily.      . colchicine 0.6 MG tablet Take 0.6 mg by mouth daily.      . digoxin (LANOXIN) 0.125 MG tablet Take 0.0625 mg by mouth daily.      . ferrous sulfate 325 (65 FE) MG  tablet Take 325 mg by mouth daily with breakfast.      . furosemide (LASIX) 20 MG tablet Take 20-40 mg by mouth daily as needed (for swelling).      . Oxymetazoline HCl (NOSTRILLA CONGESTION RELIEF NA) Place 1 spray into the nose daily as needed (for congestion).      . potassium chloride SA (K-DUR,KLOR-CON) 20 MEQ tablet Take 20 mEq by mouth daily.      [name] warfarin (COUMADIN) 5 MG tablet Take 2.5-5 mg by mouth daily. Take 2.5mg  daily, except take 5mg  on Mondays.        Blood pressure 112/53, pulse 71, temperature 98.5 F (36.9 C), temperature source Oral, resp. rate 18, height 5\' 3"  (1.6 m), weight 233 lb 11 oz (106 kg), last menstrual period 10/10/2013, SpO2 97.00%. Physical Exam: General: pleasant, obese white female who is laying in bed in NAD HEENT: head is normocephalic, atraumatic.  Sclera are noninjected.  PERRL.  Ears and nose without any masses or lesions.  Mouth is pink and moist Heart: regular, rate, and rhythm.  Normal s1,s2. No obvious murmurs, gallops, or rubs noted.  Palpable radial and pedal pulses bilaterally Lungs: CTAB, no wheezes, rhonchi, or rales noted.  Respiratory effort nonlabored Abd: soft, NT, ND, +BS, no masses, hernias, or organomegaly GU: cellulitic changes noted of her mons, labia, perineum, and perirectal region.  She has extensive edema noted of her mons and her left labia.  She has a small opening in her inferior left gluteus towards her perineum that is draining purulent drainage.  She has a small ecchymosis noted just lateral to her rectum.  She does have a large blackhead that was evacuated posterior to her rectum.  This was nonpainful. MS: all 4 extremities are symmetrical with no cyanosis, clubbing, or edema. Skin: warm and dry with no masses, lesions, or rashes Psych: A&Ox3 with an appropriate affect.    Results for orders placed during the hospital encounter of 10/10/13 (from the past 48 hour(s))  URINALYSIS, ROUTINE W REFLEX MICROSCOPIC     Status:  Abnormal   Collection Time    10/10/13  2:32 PM      Result Value Range   Color, Urine AMBER (*) YELLOW   Comment: BIOCHEMICALS MAY BE AFFECTED BY COLOR   APPearance CLOUDY (*) CLEAR   Specific Gravity, Urine 1.019  1.005 - 1.030   pH 5.0  5.0 - 8.0   Glucose, UA NEGATIVE  NEGATIVE mg/dL   Hgb urine dipstick LARGE (*) NEGATIVE   Bilirubin Urine SMALL (*) NEGATIVE   Ketones, ur NEGATIVE  NEGATIVE mg/dL   Protein, ur 30 (*) NEGATIVE mg/dL   Urobilinogen, UA 0.2  0.0 - 1.0 mg/dL   Nitrite NEGATIVE  NEGATIVE   Leukocytes, UA LARGE (*) NEGATIVE  URINE MICROSCOPIC-ADD ON     Status: Abnormal   Collection Time    10/10/13  2:32 PM      Result Value Range   Squamous Epithelial / LPF FEW (*) RARE   WBC, UA 21-50  <3 WBC/hpf   RBC / HPF 11-20  <3 RBC/hpf   Bacteria, UA FEW (*) RARE   Casts HYALINE CASTS (*) NEGATIVE   Urine-Other MUCOUS PRESENT     Comment: AMORPHOUS URATES/PHOSPHATES  URINE CULTURE     Status: None   Collection Time    10/10/13  2:32 PM      Result Value Range   Specimen Description URINE, RANDOM     Special Requests NONE     Culture  Setup Time       Value: 10/10/2013 20:40     Performed at [facility] Count       Value: >=100,000 COLONIES/ML     Performed at [facility]   Culture       Value: Multiple bacterial morphotypes present, none predominant. Suggest appropriate recollection if clinically indicated.     Performed at [facility]   Report Status 10/12/2013 FINAL    PRO B NATRIURETIC PEPTIDE     Status: Abnormal   Collection Time    10/10/13  4:34 PM      Result Value Range   Pro B Natriuretic peptide (BNP) 6217.0 (*) 0 - 450 pg/mL  MRSA PCR SCREENING     Status: None   Collection Time    10/10/13  6:24 PM      Result Value Range   MRSA by PCR NEGATIVE  NEGATIVE   Comment:            The GeneXpert MRSA Assay (FDA     approved for NASAL specimens     only), is one component of a     comprehensive MRSA  colonization     surveillance program. It is not     intended to diagnose MRSA     infection nor to guide or     monitor treatment for     MRSA infections.  DIGOXIN LEVEL     Status: Abnormal   Collection Time    10/10/13  6:43 PM      Result Value Range   Digoxin Level 0.6 (*) 0.8 - 2.0 ng/mL  TROPONIN I     Status: None   Collection Time    10/10/13  6:43 PM      Result Value Range   Troponin I <0.30  <0.30 ng/mL   Comment:            Due to the release kinetics of cTnI,     a negative result within the first hours     of the onset of symptoms does not rule out     myocardial infarction with certainty.     If myocardial infarction is still suspected,     repeat the test at appropriate intervals.  TROPONIN I     Status: None   Collection Time    10/10/13 10:55 PM      Result Value Range   Troponin I <0.30  <0.30 ng/mL   Comment:            Due to the release kinetics of cTnI,     a negative result within the first hours     of the onset of symptoms does not rule out     myocardial infarction with certainty.     If myocardial infarction is still suspected,     repeat the test at appropriate intervals.  TROPONIN I     Status: None   Collection Time    10/11/13  5:10 AM      Result Value Range   Troponin I <0.30  <0.30 ng/mL   Comment:            Due to the release kinetics of cTnI,     a negative result within the first hours     of the onset of symptoms does not rule out     myocardial infarction with certainty.     If myocardial infarction is still suspected,     repeat the test at appropriate intervals.  BASIC METABOLIC PANEL     Status: Abnormal   Collection Time    10/11/13 11:49 AM      Result Value Range   Sodium 139  135 - 145 mEq/L   Potassium 3.8  3.5 - 5.1 mEq/L   Chloride 104  96 - 112 mEq/L   CO2 22  19 - 32 mEq/L   Glucose, Bld 133 (*) 70 - 99 mg/dL   BUN 35 (*) 6 - 23 mg/dL   Creatinine, Ser 1.90 (*) 0.50 - 1.10 mg/dL   Calcium 8.0 (*) 8.4 -  10.5 mg/dL   GFR calc non Af Amer 23 (*) >90 mL/min   GFR calc Af Amer 27 (*) >90 mL/min   Comment: (NOTE)     The eGFR has been calculated using the CKD EPI equation.     This calculation has not been validated in all clinical situations.     eGFR's persistently <90 mL/min signify possible Chronic Kidney     Disease.  CBC     Status: Abnormal   Collection Time    10/11/13 11:49 AM  Result Value Range   WBC 11.7 (*) 4.0 - 10.5 K/uL   RBC 3.53 (*) 3.87 - 5.11 MIL/uL   Hemoglobin 10.0 (*) 12.0 - 15.0 g/dL   HCT 30.6 (*) 36.0 - 46.0 %   MCV 86.7  78.0 - 100.0 fL   MCH 28.3  26.0 - 34.0 pg   MCHC 32.7  30.0 - 36.0 g/dL   RDW 15.7 (*) 11.5 - 15.5 %   Platelets 124 (*) 150 - 400 K/uL  MAGNESIUM     Status: None   Collection Time    10/11/13 11:49 AM      Result Value Range   Magnesium 2.1  1.5 - 2.5 mg/dL  PROTIME-INR     Status: Abnormal   Collection Time    10/11/13 11:49 AM      Result Value Range   Prothrombin Time 43.8 (*) 11.6 - 15.2 seconds   INR 4.91 (*) 0.00 - 1.49  CLOSTRIDIUM DIFFICILE BY PCR     Status: None   Collection Time    10/11/13  8:38 PM      Result Value Range   C difficile by pcr NEGATIVE  NEGATIVE  PROTIME-INR     Status: Abnormal   Collection Time    10/12/13  4:20 AM      Result Value Range   Prothrombin Time 41.9 (*) 11.6 - 15.2 seconds   INR 4.63 (*) 0.00 - 1.49  CBC     Status: Abnormal   Collection Time    10/12/13  4:20 AM      Result Value Range   WBC 11.7 (*) 4.0 - 10.5 K/uL   Comment: WHITE COUNT CONFIRMED ON SMEAR   RBC 3.49 (*) 3.87 - 5.11 MIL/uL   Hemoglobin 9.6 (*) 12.0 - 15.0 g/dL   HCT 29.8 (*) 36.0 - 46.0 %   MCV 85.4  78.0 - 100.0 fL   MCH 27.5  26.0 - 34.0 pg   MCHC 32.2  30.0 - 36.0 g/dL   RDW 15.8 (*) 11.5 - 15.5 %   Platelets 135 (*) 150 - 400 K/uL   Comment: PLATELET CLUMPS NOTED ON SMEAR  BASIC METABOLIC PANEL     Status: Abnormal   Collection Time    10/12/13  4:20 AM      Result Value Range   Sodium 138  135  - 145 mEq/L   Potassium 3.8  3.5 - 5.1 mEq/L   Chloride 103  96 - 112 mEq/L   CO2 21  19 - 32 mEq/L   Glucose, Bld 83  70 - 99 mg/dL   BUN 44 (*) 6 - 23 mg/dL   Creatinine, Ser 2.12 (*) 0.50 - 1.10 mg/dL   Calcium 8.1 (*) 8.4 - 10.5 mg/dL   GFR calc non Af Amer 20 (*) >90 mL/min   GFR calc Af Amer 23 (*) >90 mL/min   Comment: (NOTE)     The eGFR has been calculated using the CKD EPI equation.     This calculation has not been validated in all clinical situations.     eGFR's persistently <90 mL/min signify possible Chronic Kidney     Disease.  PREPARE FRESH FROZEN PLASMA     Status: None   Collection Time    10/12/13 12:48 PM      Result Value Range   Unit Number [id number]     Blood Component Type THAWED PLASMA     Unit division 00     Status  of Unit ALLOCATED     Transfusion Status OK TO TRANSFUSE     Unit Number [id number]     Blood Component Type THAWED PLASMA     Unit division 00     Status of Unit ALLOCATED     Transfusion Status OK TO TRANSFUSE     Ct Abdomen Pelvis Wo Contrast  10/10/2013   CLINICAL DATA:  Vomiting and diarrhea for 3-4 days, generalized weakness, on Coumadin, history is CHF  EXAM: CT ABDOMEN AND PELVIS WITHOUT CONTRAST  TECHNIQUE: Multidetector CT imaging of the abdomen and pelvis was performed following the standard protocol without intravenous contrast.  COMPARISON:  Lumbar spine CT -06/15/2009  FINDINGS: The lack of intravenous contrast limits the ability to evaluate solid abdominal organs.  There is mild nodularity of the hepatic contour, nonspecific though could be seen in the setting of cirrhosis. This finding is associated with small volume of intra-abdominal ascites. Post cholecystectomy. There is apparent fusiform ectasia of the origin of the portal vein regional to the confluence of the superior mesenteric and splenic veins measuring approximately 2.8 cm in greatest oblique axial dimension (image 31, series 2).  The bilateral kidneys are  slightly atrophic compatible with provided history of renal insufficiency. No discrete renal stones. No urinary obstruction or perinephric stranding. Normal noncontrast appearance of the bilateral adrenal glands. There is a crescentic approximately 1.6 x 1.4 cm presumed partially calcified splenic artery aneurysm (image 26, series 2). Additional smaller presumed splenic aneurysms are seen about the splenic hilum.  Scattered colonic diverticulosis without evidence of diverticulitis. The colon is largely decompressed. The bowel is otherwise normal in course and caliber without discrete area of wall thickening or evidence of obstruction. The appendix is not definitely identified, however there is no definitive inflammatory change within the right lower abdominal quadrant. No pneumoperitoneum, pneumatosis or portal venous gas.  Atherosclerotic plaque within a normal caliber abdominal aorta. Scattered shotty retroperitoneal lymph nodes are individually not enlarged by size criteria. No retroperitoneal, mesenteric, pelvic or inguinal lymphadenopathy on this noncontrast examination.  The pelvic organs her expectantly atrophic. No discrete adnexal lesion.  Limited visualization of the lower thorax demonstrates minimal subsegmental atelectasis within the caudal aspect of the right middle lobe in an left lower lobe. No focal airspace opacities. No pleural effusion.  Cardiomegaly. Ventricular pacer leads are seen within the right atrium, ventricle and coronary sinus. No pericardial effusion.  No acute or aggressive osseous abnormalities. There is mild straightening expected lumbar lordosis. Grossly unchanged mild (approximately 25%) compression deformity involving the anterior aspect of the superior endplate of the L4 vertebral body. Grossly unchanged mild (approximately 5 mm) of retrolisthesis of L4 upon L5.  IMPRESSION: 1. Colonic diverticulosis without definite evidence of diverticulitis on this noncontrast examination. The  colon is decompressed without definite evidence of obstruction. 2. Mild nodularity of the hepatic contour suggestive of cirrhosis. This finding is associated with small volume intra-abdominal ascites. 3. Incidental note made of an approximately 1.6 cm splenic artery aneurysm - this is of doubtful clinical concern in this postmenopausal patient. 4. Suspected fusiform ectasia of the origin of the portal vein, measuring approximately 1.8 cm, incompletely evaluated on this noncontrast examination and of uncertain clinical significant. 5. Cardiomegaly.   Electronically Signed   By: Sandi Mariscal M.D.   On: 10/10/2013 15:48   Dg Chest 2 View  10/10/2013   CLINICAL DATA:  Cough.  EXAM: CHEST  2 VIEW  COMPARISON:  November 29, 2007.  FINDINGS: Stable cardiomegaly. Status post mitral  valve replacement. Left-sided pacemaker is unchanged in position. No pneumothorax or pleural effusion is noted. No acute pulmonary disease is noted. Bony thorax is intact.  IMPRESSION: No acute cardiopulmonary abnormality seen.   Electronically Signed   By: Sabino Dick M.D.   On: 10/10/2013 17:04       Assessment/Plan 1. Abscess of perineum 2. New diagnosis of cirrhosis, ? NASH vs secondary to chronic tylenol use Patient Active Problem List   Diagnosis Date Noted  . Chronic anticoagulation 10/10/2013  . Liver cirrhosis 10/10/2013  . Thrombocytopenia 10/10/2013  . Nausea and vomiting 10/10/2013  . CHF, acute on chronic 10/10/2013  . UTI (urinary tract infection) 10/10/2013  . CKD (chronic kidney disease) stage 3, GFR 30-59 ml/min 10/10/2013  . Anemia 10/10/2013   Plan: 1. Agree with vanc and zosyn. 2. The patient will need reversal of her INR for surgical drainage tomorrow of this area.  We have written to give her 5 mg of IV Vit K and 2 units of FFP with a dose of lasix in between each unit.  We will recheck her INR after her last unit to determine if she needs further correction tonight in preparation for tomorrow.  She  has been made NPO p MN for surgical intervention tomorrow. 3. We will follow along with you  Griffin Dewilde E 10/12/2013, 2:26 PM Pager: [phone]

## 2013-10-12 NOTE — Progress Notes (Signed)
ANTIBIOTIC CONSULT NOTE - INITIAL  Pharmacy Consult for vancomycin/zosyn Indication: perirectal Abscess   No Known Allergies  Patient Measurements: Height: 5\' 3"  (160 cm) Weight: 233 lb 11 oz (106 kg) IBW/kg (Calculated) : 52.4  Vital Signs: Temp: 98.5 F (36.9 C) (11/26 0755) Temp src: Oral (11/26 0755) BP: 108/49 mmHg (11/26 0755) Pulse Rate: 72 (11/26 0755) Intake/Output from previous day: 11/25 0701 - 11/26 0700 In: 423 [P.O.:420; I.V.:3] Out: 300 [Urine:300] Intake/Output from this shift: Total I/O In: -  Out: 100 [Urine:100]  Labs:  Recent Labs  10/10/13 1204 10/11/13 1149 10/12/13 0420  WBC 10.8* 11.7* 11.7*  HGB 9.3* 10.0* 9.6*  PLT 111* 124* 135*  CREATININE 1.49* 1.90* 2.12*   Estimated Creatinine Clearance: 22.6 ml/min (by C-G formula based on Cr of 2.12). No results found for this basename: VANCOTROUGH, VANCOPEAK, VANCORANDOM, GENTTROUGH, GENTPEAK, GENTRANDOM, TOBRATROUGH, TOBRAPEAK, TOBRARND, AMIKACINPEAK, AMIKACINTROU, AMIKACIN,  in the last 72 hours     Medical History: Past Medical History  Diagnosis Date  . CHF (congestive heart failure)   . Hypertension   . Renal disorder     Medications:  Prescriptions prior to admission  Medication Sig Dispense Refill  . acetaminophen (TYLENOL) 500 MG tablet Take 500 mg by mouth every 6 (six) hours as needed for mild pain.      . beta carotene w/minerals (OCUVITE) tablet Take 1 tablet by mouth daily.      . carvedilol (COREG) 12.5 MG tablet Take 12.5 mg by mouth 2 (two) times daily with a meal.      . cetirizine (ZYRTEC) 10 MG tablet Take 10 mg by mouth daily.      . colchicine 0.6 MG tablet Take 0.6 mg by mouth daily.      . digoxin (LANOXIN) 0.125 MG tablet Take 0.0625 mg by mouth daily.      . ferrous sulfate 325 (65 FE) MG tablet Take 325 mg by mouth daily with breakfast.      . furosemide (LASIX) 20 MG tablet Take 20-40 mg by mouth daily as needed (for swelling).      . Oxymetazoline HCl  (NOSTRILLA CONGESTION RELIEF NA) Place 1 spray into the nose daily as needed (for congestion).      . potassium chloride SA (K-DUR,KLOR-CON) 20 MEQ tablet Take 20 mEq by mouth daily.      [name] warfarin (COUMADIN) 5 MG tablet Take 2.5-5 mg by mouth daily. Take 2.5mg  daily, except take 5mg  on Mondays.       Scheduled:  . HYDROcodone-homatropine  5 mL Oral BID  . piperacillin-tazobactam (ZOSYN)  IV  2.25 g Intravenous Q6H  . sodium chloride  3 mL Intravenous Q12H  . [START ON 10/14/2013] vancomycin  1,500 mg Intravenous Q48H  . vancomycin  2,000 mg Intravenous Once     Assessment: 79 yo female with UTI and on rocephin to change to vancomycin/zosyn for perirectal abscess.  WBC= 10.8, SCr= 2.12 (with trend up) and CrCl ~ 20. Patient noted on coumadin PTA for afib and INR is 4.63 and coumadin is on hold.   11/24 urine- multiple  Types 11/26 urine>>  CTX 11/24>> 11/26 11/26 vanc 11/26 zosyn   Goal of Therapy:  Vancomycin trough level 15-20 mcg/ml  Plan:  -Zosyn 2.25gm IV q6h -Vancomycin 2000mg  IV x1 followed by 1500mg  IV q48hr -Will follow renal function, cultures and clinical progress  Hildred Laser, Pharm D 10/12/2013 11:35 AM

## 2013-10-12 NOTE — Progress Notes (Signed)
Pharmacist Heart Failure Core Measure Documentation  Assessment: [name] has an EF documented as 35-40% on 10/11/13 by Echo.  Rationale: Heart failure patients with left ventricular systolic dysfunction (LVSD) and an EF < 40% should be prescribed an angiotensin converting enzyme inhibitor (ACEI) or angiotensin receptor blocker (ARB) at discharge unless a contraindication is documented in the medical record.  This patient is not currently on an ACEI or ARB for HF.  This note is being placed in the record in order to provide documentation that a contraindication to the use of these agents is present for this encounter.  ACE Inhibitor or Angiotensin Receptor Blocker is contraindicated (specify all that apply)  []   ACEI allergy AND ARB allergy []   Angioedema []   Moderate or severe aortic stenosis []   Hyperkalemia []   Hypotension []   Renal artery stenosis [x]   Worsening renal function, preexisting renal disease or dysfunction   Hildred Laser, Pharm D 10/12/2013 1:05 PM

## 2013-10-12 NOTE — Progress Notes (Signed)
[facility] PROGRESS NOTE  [name] [id number] DOB: [birth year] DOA: 10/10/2013 PCP: No primary provider on file.  Assessment/Plan:  Left labia, perirectal Abscess: Change antibiotics to Vancomycin and Zosyn. Surgery consulted. She will probably required FFP if require procedure.   Liver cirrhosis on imaging - patient without known history of any liver disease. She has ascites, elevated total bilirubin, thrombocytopenia. Will order viral hepatitis panel. GI consulted.   Urinary tract infection - patient started on empiric ceftriaxone on admission. UA with 21 to 50 WBC. Change ceftriaxone to Zosyn due to # 1. Urine culture grew multiple bacterial morphotype. Re send urine culture.   Acute on chronic renal Failure: Last cr per records at 1.3. Cr on admission at 1.4.  -Cr increase to 2.1 today. Patient received lasix 11-25. Will hold lasix for now.   Acute on chronic systolic heart failure - last echocardiogram that we have in system was back in 2008. BNP elevated last night. 2D echo done this morning shows further decrease in her EF to 35-40% and anteroseptal hypokinesis. Cardiology consulted, appreciate input. Received dose of lasix 11-25. Will likely hold lasix today.    Nausea/vomiting - due to probably acute illness.   Diarrhea; C diff negative. No BM today. She usually has 3 BM per day.   Chronic anemia: follow hb trend.   S/p mitral valve annuloplasty   Atrial fibrillation - on Coumadin. Continue to hold Coumadin. INR at 4.6.  - has underlying liver disease which is not helping.   Status post pacemaker   Chronic anticoagulation -will defer to cardio continuation of anticoagulation.  Thrombocytopenia - due to infection process, ? cirrhosis. Improving.    Code Status: DNR Family Communication: Care discussed with Daughter.  Disposition Plan: Remain step Down.    Consultants:  Surgery.  Cardiology  GI  Procedures:  none  Antibiotics:  Ceftriaxone  11-25  Zosyn 11-26  Vancomycin 11-26  HPI/Subjective: Patient had 3 loose stool yesterday.  Nausea and vomiting better. Started 3 days prior to admission.  Patient no report swelling of left labia since 3 days prior to admission.    Objective: Filed Vitals:   10/12/13 0755  BP: 108/49  Pulse: 72  Temp: 98.5 F (36.9 C)  Resp: 22    Intake/Output Summary (Last 24 hours) at 10/12/13 0948 Last data filed at 10/12/13 0900  Gross per 24 hour  Intake    303 ml  Output    350 ml  Net    -47 ml   Filed Weights   10/10/13 1805 10/11/13 0500 10/12/13 0449  Weight: 106.1 kg (233 lb 14.5 oz) 106.1 kg (233 lb 14.5 oz) 106 kg (233 lb 11 oz)    Exam:   General:  No distress  Cardiovascular: S 1, S 2 RRR  Respiratory: CTA  Abdomen: Bs present, soft, NT, distended.   Musculoskeletal: no edema.  Pelvic; Left labia with swelling, redness, left perirectal area with redness, swell, appears to have small skin ulceration.   Data Reviewed: Basic Metabolic Panel:  Recent Labs Lab 10/10/13 1204 10/11/13 1149 10/12/13 0420  NA 141 139 138  K 3.5 3.8 3.8  CL 108 104 103  CO2 21 22 21   GLUCOSE 109* 133* 83  BUN 25* 35* 44*  CREATININE 1.49* 1.90* 2.12*  CALCIUM 8.0* 8.0* 8.1*  MG  --  2.1  --    Liver Function Tests:  Recent Labs Lab 10/10/13 1204  AST 12  ALT 7  ALKPHOS 108  BILITOT  2.1*  PROT 6.1  ALBUMIN 3.1*   No results found for this basename: LIPASE, AMYLASE,  in the last 168 hours No results found for this basename: AMMONIA,  in the last 168 hours CBC:  Recent Labs Lab 10/10/13 1204 10/11/13 1149 10/12/13 0420  WBC 10.8* 11.7* 11.7*  HGB 9.3* 10.0* 9.6*  HCT 28.4* 30.6* 29.8*  MCV 86.1 86.7 85.4  PLT 111* 124* 135*   Cardiac Enzymes:  Recent Labs Lab 10/10/13 1843 10/10/13 2255 10/11/13 0510  TROPONINI <0.30 <0.30 <0.30   BNP (last 3 results)  Recent Labs  10/10/13 1634  PROBNP 6217.0*   CBG: No results found for this basename:  GLUCAP,  in the last 168 hours  Recent Results (from the past 240 hour(s))  URINE CULTURE     Status: None   Collection Time    10/10/13  2:32 PM      Result Value Range Status   Specimen Description URINE, RANDOM   Final   Special Requests NONE   Final   Culture  Setup Time     Final   Value: 10/10/2013 20:40     Performed at [facility]     Final   Value: >=100,000 COLONIES/ML     Performed at [facility]   Culture     Final   Value: Multiple bacterial morphotypes present, none predominant. Suggest appropriate recollection if clinically indicated.     Performed at [facility]   Report Status 10/12/2013 FINAL   Final  MRSA PCR SCREENING     Status: None   Collection Time    10/10/13  6:24 PM      Result Value Range Status   MRSA by PCR NEGATIVE  NEGATIVE Final   Comment:            The GeneXpert MRSA Assay (FDA     approved for NASAL specimens     only), is one component of a     comprehensive MRSA colonization     surveillance program. It is not     intended to diagnose MRSA     infection nor to guide or     monitor treatment for     MRSA infections.  CLOSTRIDIUM DIFFICILE BY PCR     Status: None   Collection Time    10/11/13  8:38 PM      Result Value Range Status   C difficile by pcr NEGATIVE  NEGATIVE Final     Studies: Ct Abdomen Pelvis Wo Contrast  10/10/2013   CLINICAL DATA:  Vomiting and diarrhea for 3-4 days, generalized weakness, on Coumadin, history is CHF  EXAM: CT ABDOMEN AND PELVIS WITHOUT CONTRAST  TECHNIQUE: Multidetector CT imaging of the abdomen and pelvis was performed following the standard protocol without intravenous contrast.  COMPARISON:  Lumbar spine CT -06/15/2009  FINDINGS: The lack of intravenous contrast limits the ability to evaluate solid abdominal organs.  There is mild nodularity of the hepatic contour, nonspecific though could be seen in the setting of cirrhosis. This finding is associated with  small volume of intra-abdominal ascites. Post cholecystectomy. There is apparent fusiform ectasia of the origin of the portal vein regional to the confluence of the superior mesenteric and splenic veins measuring approximately 2.8 cm in greatest oblique axial dimension (image 31, series 2).  The bilateral kidneys are slightly atrophic compatible with provided history of renal insufficiency. No discrete renal stones. No urinary obstruction or perinephric stranding.  Normal noncontrast appearance of the bilateral adrenal glands. There is a crescentic approximately 1.6 x 1.4 cm presumed partially calcified splenic artery aneurysm (image 26, series 2). Additional smaller presumed splenic aneurysms are seen about the splenic hilum.  Scattered colonic diverticulosis without evidence of diverticulitis. The colon is largely decompressed. The bowel is otherwise normal in course and caliber without discrete area of wall thickening or evidence of obstruction. The appendix is not definitely identified, however there is no definitive inflammatory change within the right lower abdominal quadrant. No pneumoperitoneum, pneumatosis or portal venous gas.  Atherosclerotic plaque within a normal caliber abdominal aorta. Scattered shotty retroperitoneal lymph nodes are individually not enlarged by size criteria. No retroperitoneal, mesenteric, pelvic or inguinal lymphadenopathy on this noncontrast examination.  The pelvic organs her expectantly atrophic. No discrete adnexal lesion.  Limited visualization of the lower thorax demonstrates minimal subsegmental atelectasis within the caudal aspect of the right middle lobe in an left lower lobe. No focal airspace opacities. No pleural effusion.  Cardiomegaly. Ventricular pacer leads are seen within the right atrium, ventricle and coronary sinus. No pericardial effusion.  No acute or aggressive osseous abnormalities. There is mild straightening expected lumbar lordosis. Grossly unchanged mild  (approximately 25%) compression deformity involving the anterior aspect of the superior endplate of the L4 vertebral body. Grossly unchanged mild (approximately 5 mm) of retrolisthesis of L4 upon L5.  IMPRESSION: 1. Colonic diverticulosis without definite evidence of diverticulitis on this noncontrast examination. The colon is decompressed without definite evidence of obstruction. 2. Mild nodularity of the hepatic contour suggestive of cirrhosis. This finding is associated with small volume intra-abdominal ascites. 3. Incidental note made of an approximately 1.6 cm splenic artery aneurysm - this is of doubtful clinical concern in this postmenopausal patient. 4. Suspected fusiform ectasia of the origin of the portal vein, measuring approximately 1.8 cm, incompletely evaluated on this noncontrast examination and of uncertain clinical significant. 5. Cardiomegaly.   Electronically Signed   By: Sandi Mariscal M.D.   On: 10/10/2013 15:48   Dg Chest 2 View  10/10/2013   CLINICAL DATA:  Cough.  EXAM: CHEST  2 VIEW  COMPARISON:  November 29, 2007.  FINDINGS: Stable cardiomegaly. Status post mitral valve replacement. Left-sided pacemaker is unchanged in position. No pneumothorax or pleural effusion is noted. No acute pulmonary disease is noted. Bony thorax is intact.  IMPRESSION: No acute cardiopulmonary abnormality seen.   Electronically Signed   By: Sabino Dick M.D.   On: 10/10/2013 17:04    Scheduled Meds: . cefTRIAXone (ROCEPHIN)  IV  1 g Intravenous Q24H  . HYDROcodone-homatropine  5 mL Oral BID  . sodium chloride  3 mL Intravenous Q12H   Continuous Infusions:   Active Problems:   Chronic anticoagulation   Liver cirrhosis   Thrombocytopenia   Nausea and vomiting   CHF, acute on chronic   UTI (urinary tract infection)   CKD (chronic kidney disease) stage 3, GFR 30-59 ml/min   Anemia    Time spent: 35 minutes.     Adamaris King  [facility] Pager [phone]. If 7PM-7AM, please contact  night-coverage at [email or web address], password [email or web address] 10/12/2013, 9:48 AM  LOS: 2 days

## 2013-10-13 DIAGNOSIS — K611 Rectal abscess: Secondary | ICD-10-CM | POA: Diagnosis present

## 2013-10-13 DIAGNOSIS — Z7901 Long term (current) use of anticoagulants: Secondary | ICD-10-CM

## 2013-10-13 DIAGNOSIS — D696 Thrombocytopenia, unspecified: Secondary | ICD-10-CM

## 2013-10-13 DIAGNOSIS — I5043 Acute on chronic combined systolic (congestive) and diastolic (congestive) heart failure: Secondary | ICD-10-CM

## 2013-10-13 DIAGNOSIS — I4891 Unspecified atrial fibrillation: Secondary | ICD-10-CM

## 2013-10-13 LAB — BASIC METABOLIC PANEL
BUN: 55 mg/dL — ABNORMAL HIGH (ref 6–23)
CO2: 20 mEq/L (ref 19–32)
Chloride: 99 mEq/L (ref 96–112)
Creatinine, Ser: 2.47 mg/dL — ABNORMAL HIGH (ref 0.50–1.10)
GFR calc Af Amer: 19 mL/min — ABNORMAL LOW (ref >90)
Potassium: 3.5 mEq/L (ref 3.5–5.1)
Sodium: 134 mEq/L — ABNORMAL LOW (ref 135–145)

## 2013-10-13 LAB — PREPARE FRESH FROZEN PLASMA
Unit division: 0
Unit division: 0

## 2013-10-13 LAB — CBC
HCT: 28.3 % — ABNORMAL LOW (ref 36.0–46.0)
Hemoglobin: 9.5 g/dL — ABNORMAL LOW (ref 12.0–15.0)
MCHC: 33.6 g/dL (ref 30.0–36.0)
RBC: 3.36 MIL/uL — ABNORMAL LOW (ref 3.87–5.11)
RDW: 15.5 % (ref 11.5–15.5)
WBC: 10.6 10*3/uL — ABNORMAL HIGH (ref 4.0–10.5)

## 2013-10-13 LAB — LACTIC ACID, PLASMA: Lactic Acid, Venous: 1.1 mmol/L (ref 0.5–2.2)

## 2013-10-13 LAB — PRO B NATRIURETIC PEPTIDE: Pro B Natriuretic peptide (BNP): 8553 pg/mL — ABNORMAL HIGH (ref 0–450)

## 2013-10-13 LAB — PROTIME-INR: INR: 1.6 — ABNORMAL HIGH (ref 0.00–1.49)

## 2013-10-13 MED ORDER — FERROUS SULFATE 325 (65 FE) MG PO TABS
325.0000 mg | ORAL_TABLET | Freq: Every day | ORAL | Status: DC
  Administered 2013-10-13 – 2013-10-17 (×4): 325 mg via ORAL
  Filled 2013-10-13 (×5): qty 1

## 2013-10-13 MED ORDER — COLCHICINE 0.6 MG PO TABS
0.6000 mg | ORAL_TABLET | Freq: Every day | ORAL | Status: DC
  Filled 2013-10-13 (×3): qty 1

## 2013-10-13 MED ORDER — DIGOXIN 0.0625 MG HALF TABLET
0.0625 mg | ORAL_TABLET | Freq: Every day | ORAL | Status: DC
  Administered 2013-10-13: 0.0625 mg via ORAL
  Filled 2013-10-13 (×2): qty 1

## 2013-10-13 MED ORDER — DOPAMINE-DEXTROSE 3.2-5 MG/ML-% IV SOLN
2.5000 ug/kg/min | INTRAVENOUS | Status: DC
  Administered 2013-10-13: 2.5 ug/kg/min via INTRAVENOUS
  Filled 2013-10-13: qty 250

## 2013-10-13 MED ORDER — DOBUTAMINE IN D5W 4-5 MG/ML-% IV SOLN
2.5000 ug/kg/min | INTRAVENOUS | Status: DC
  Administered 2013-10-13: 2.5 ug/kg/min via INTRAVENOUS
  Administered 2013-10-15: 5 ug/kg/min via INTRAVENOUS
  Administered 2013-10-18: 2.5 ug/kg/min via INTRAVENOUS
  Filled 2013-10-13 (×4): qty 250

## 2013-10-13 MED ORDER — OCUVITE PO TABS
1.0000 | ORAL_TABLET | Freq: Every day | ORAL | Status: DC
  Administered 2013-10-13 – 2013-10-20 (×7): 1 via ORAL
  Filled 2013-10-13 (×10): qty 1

## 2013-10-13 MED ORDER — LORATADINE 10 MG PO TABS
10.0000 mg | ORAL_TABLET | Freq: Every day | ORAL | Status: DC
  Administered 2013-10-13 – 2013-10-20 (×8): 10 mg via ORAL
  Filled 2013-10-13 (×8): qty 1

## 2013-10-13 NOTE — Progress Notes (Signed)
  Subjective: Complains that everything is wrong with her. Short of breath. Pain in left perineum  Objective: Vital signs in last 24 hours: Temp:  [98 F (36.7 C)-99.4 F (37.4 C)] 98.7 F (37.1 C) (11/27 0816) Pulse Rate:  [69-72] 71 (11/27 0816) Resp:  [17-27] 25 (11/27 0816) BP: (77-112)/(32-59) 104/47 mmHg (11/27 0816) SpO2:  [94 %-100 %] 97 % (11/27 0816) Last BM Date: 10/11/13  Intake/Output from previous day: 11/26 0701 - 11/27 0700 In: 892.5 [Blood:312.5] Out: 100 [Urine:100] Intake/Output this shift:    Resp: diminished breath sounds bilaterally Cardio: regular rate and rhythm GI: left perineal wound draining spontaneously  Lab Results:   Recent Labs  10/12/13 0420 10/13/13 0720  WBC 11.7* 10.6*  HGB 9.6* 9.5*  HCT 29.8* 28.3*  PLT 135* 140*   BMET  Recent Labs  10/12/13 0420 10/13/13 0720  NA 138 134*  K 3.8 3.5  CL 103 99  CO2 21 20  GLUCOSE 83 103*  BUN 44* 55*  CREATININE 2.12* 2.47*  CALCIUM 8.1* 8.0*   PT/INR  Recent Labs  10/12/13 2050 10/13/13 0720  LABPROT 22.1* 18.6*  INR 2.00* 1.60*   ABG No results found for this basename: PHART, PCO2, PO2, HCO3,  in the last 72 hours  Studies/Results: No results found.  Anti-infectives: Anti-infectives   Start     Dose/Rate Route Frequency Ordered Stop   10/14/13 1300  vancomycin (VANCOCIN) 1,500 mg in sodium chloride 0.9 % 500 mL IVPB     1,500 mg 250 mL/hr over 120 Minutes Intravenous Every 48 hours 10/12/13 1131     10/12/13 1300  vancomycin (VANCOCIN) 2,000 mg in sodium chloride 0.9 % 500 mL IVPB     2,000 mg 250 mL/hr over 120 Minutes Intravenous  Once 10/12/13 1131 10/12/13 1500   10/12/13 1230  piperacillin-tazobactam (ZOSYN) IVPB 2.25 g     2.25 g 100 mL/hr over 30 Minutes Intravenous Every 6 hours 10/12/13 1131     10/11/13 0600  cefTRIAXone (ROCEPHIN) 1 g in dextrose 5 % 50 mL IVPB  Status:  Discontinued     1 g 100 mL/hr over 30 Minutes Intravenous Every 24 hours  10/10/13 1828 10/12/13 1021   10/10/13 1530  cefTRIAXone (ROCEPHIN) 1 g in dextrose 5 % 50 mL IVPB     1 g 100 mL/hr over 30 Minutes Intravenous  Once 10/10/13 1529 10/10/13 1635      Assessment/Plan: s/p * No surgery found * Appreciate Cardiology input. They feel her CHF is worse and would like to hold of for 1day before going to surgery Since her wbc is coming down, she is not febrile, and the wound is spontaneously draining I think it will be ok to wait. Plan for I and D in am. Continue abx  LOS: 3 days    TOTH III,PAUL S 10/13/2013

## 2013-10-13 NOTE — Progress Notes (Signed)
[facility] PROGRESS NOTE  Akeera Henne Krummel [id number] DOB: Dec 26, 1925 DOA: 10/10/2013 PCP: No primary provider on file.  Assessment/Plan:  Left labia, perirectal Abscess: Continue with  Vancomycin and Zosyn day 2. Surgery consulted. INR 1.6. Dr Radford Pax would like to delay procedure if not emergent until heart failure is more stable. Will check lactic acid. WBC trending down.   Liver cirrhosis on imaging - patient without known history of any liver disease. She has ascites, elevated total bilirubin, thrombocytopenia.  viral hepatitis panel negative. GI consulted. Dr Percell Miller think cirrhosis could be due to NASH. No further work up at this point.   Urinary tract infection - patient started on empiric ceftriaxone on admission. UA with 21 to 50 WBC. Change ceftriaxone to Zosyn due to # 1. Urine culture grew multiple bacterial morphotype. Re send urine culture.   Acute on chronic renal Failure: Last cr per records at 1.3. Cr on admission at 1.4.  -Cr increase to 2. 4.  Patient received lasix 11-25. Started on dopamine to improve renal perfusion.   Acute on chronic systolic heart failure - last echocardiogram that we have in system was back in 2008. BNP elevated last night. 2D echo done this morning shows further decrease in her EF to 35-40% and anteroseptal hypokinesis. Cardiology consulted, appreciate input. Received dose of lasix 11-25.  -Started on dobutamine, dopamine. Appreciate DR turner assistance.   Nausea/vomiting - due to probably acute illness.   Diarrhea; C diff negative. No BM today. She usually has 3 BM per day.   Chronic anemia: follow hb trend. HB stable at 9.   S/p mitral valve annuloplasty   Atrial fibrillation -Continue to hold Coumadin. INR at 1.6 - has underlying liver disease which is not helping.   Status post pacemaker   Chronic anticoagulation -will defer to cardio continuation of anticoagulation.  Thrombocytopenia - due to infection process.  Improving.     Code Status: DNR Family Communication: Care discussed with patient.  Disposition Plan: Remain step Down.    Consultants:  Surgery.  Cardiology  GI  Procedures:  none  Antibiotics:  Ceftriaxone 11-25  Zosyn 11-26  Vancomycin 11-26  HPI/Subjective: Denies worsening SOB. No abdominal pain.    Objective: Filed Vitals:   10/13/13 0816  BP: 104/47  Pulse: 71  Temp: 98.7 F (37.1 C)  Resp: 25    Intake/Output Summary (Last 24 hours) at 10/13/13 0922 Last data filed at 10/13/13 0215  Gross per 24 hour  Intake  892.5 ml  Output     50 ml  Net  842.5 ml   Filed Weights   10/10/13 1805 10/11/13 0500 10/12/13 0449  Weight: 106.1 kg (233 lb 14.5 oz) 106.1 kg (233 lb 14.5 oz) 106 kg (233 lb 11 oz)    Exam:   General:  No distress  Cardiovascular: S 1, S 2 RRR  Respiratory: CTA  Abdomen: Bs present, soft, NT, distended.   Musculoskeletal: no edema.  Pelvic; Left labia with swelling, redness, left perirectal area with redness, swell, appears to have small skin ulceration.   Data Reviewed: Basic Metabolic Panel:  Recent Labs Lab 10/10/13 1204 10/11/13 1149 10/12/13 0420 10/13/13 0720  NA 141 139 138 134*  K 3.5 3.8 3.8 3.5  CL 108 104 103 99  CO2 21 22 21 20   GLUCOSE 109* 133* 83 103*  BUN 25* 35* 44* 55*  CREATININE 1.49* 1.90* 2.12* 2.47*  CALCIUM 8.0* 8.0* 8.1* 8.0*  MG  --  2.1  --   --  Liver Function Tests:  Recent Labs Lab 10/10/13 1204  AST 12  ALT 7  ALKPHOS 108  BILITOT 2.1*  PROT 6.1  ALBUMIN 3.1*   No results found for this basename: LIPASE, AMYLASE,  in the last 168 hours No results found for this basename: AMMONIA,  in the last 168 hours CBC:  Recent Labs Lab 10/10/13 1204 10/11/13 1149 10/12/13 0420 10/13/13 0720  WBC 10.8* 11.7* 11.7* 10.6*  HGB 9.3* 10.0* 9.6* 9.5*  HCT 28.4* 30.6* 29.8* 28.3*  MCV 86.1 86.7 85.4 84.2  PLT 111* 124* 135* 140*   Cardiac Enzymes:  Recent Labs Lab 10/10/13 1843  10/10/13 2255 10/11/13 0510  TROPONINI <0.30 <0.30 <0.30   BNP (last 3 results)  Recent Labs  10/10/13 1634  PROBNP 6217.0*   CBG: No results found for this basename: GLUCAP,  in the last 168 hours  Recent Results (from the past 240 hour(s))  URINE CULTURE     Status: None   Collection Time    10/10/13  2:32 PM      Result Value Range Status   Specimen Description URINE, RANDOM   Final   Special Requests NONE   Final   Culture  Setup Time     Final   Value: 10/10/2013 20:40     Performed at [facility]     Final   Value: >=100,000 COLONIES/ML     Performed at [facility]   Culture     Final   Value: Multiple bacterial morphotypes present, none predominant. Suggest appropriate recollection if clinically indicated.     Performed at [facility]   Report Status 10/12/2013 FINAL   Final  MRSA PCR SCREENING     Status: None   Collection Time    10/10/13  6:24 PM      Result Value Range Status   MRSA by PCR NEGATIVE  NEGATIVE Final   Comment:            The GeneXpert MRSA Assay (FDA     approved for NASAL specimens     only), is one component of a     comprehensive MRSA colonization     surveillance program. It is not     intended to diagnose MRSA     infection nor to guide or     monitor treatment for     MRSA infections.  CLOSTRIDIUM DIFFICILE BY PCR     Status: None   Collection Time    10/11/13  8:38 PM      Result Value Range Status   C difficile by pcr NEGATIVE  NEGATIVE Final     Studies: No results found.  Scheduled Meds: . HYDROcodone-homatropine  5 mL Oral BID  . piperacillin-tazobactam (ZOSYN)  IV  2.25 g Intravenous Q6H  . sodium chloride  3 mL Intravenous Q12H  . [START ON 10/14/2013] vancomycin  1,500 mg Intravenous Q48H   Continuous Infusions: . DOBUTamine    . DOPamine      Active Problems:   Chronic anticoagulation   Liver cirrhosis   Thrombocytopenia   Nausea and vomiting   Acute on chronic  combined systolic and diastolic congestive heart failure   UTI (urinary tract infection)   CKD (chronic kidney disease) stage 3, GFR 30-59 ml/min   Anemia   Atrial fibrillation    Time spent: 35 minutes.     Gustav Knueppel  [facility] Pager [phone]. If 7PM-7AM, please contact night-coverage at [email or web address],  password [email or web address] 10/13/2013, 9:22 AM  LOS: 3 days

## 2013-10-13 NOTE — Progress Notes (Signed)
SUBJECTIVE:  Feels terrible today.  No further diarrhea  OBJECTIVE:   Vitals:   Filed Vitals:   10/13/13 0230 10/13/13 0300 10/13/13 0319 10/13/13 0424  BP:  101/38  91/42  Pulse: 70 71 70 70  Temp: 98.7 F (37.1 C)  99 F (37.2 C) 99.2 F (37.3 C)  TempSrc: Oral  Axillary Oral  Resp: 27 17 22 21   Height:      Weight:      SpO2:    97%   I&O's:   Intake/Output Summary (Last 24 hours) at 10/13/13 0750 Last data filed at 10/13/13 0215  Gross per 24 hour  Intake  892.5 ml  Output    100 ml  Net  792.5 ml   TELEMETRY: Reviewed telemetry pt in paced rhythm:     PHYSICAL EXAM General: Well developed, well nourished, in no acute distress Head: Eyes PERRLA, No xanthomas.   Normal cephalic and atramatic  Lungs:   Clear bilaterally to auscultation and percussion. Heart:   HRRR S1 S2 Pulses are 2+ & equal. Abdomen: Bowel sounds are positive, abdomen soft and non-tender without masses  Extremities:  2+ edema bilaterally Neuro: Alert and oriented X 3. Psych:  Good affect, responds appropriately   LABS: Basic Metabolic Panel:  Recent Labs  10/11/13 1149 10/12/13 0420  NA 139 138  K 3.8 3.8  CL 104 103  CO2 22 21  GLUCOSE 133* 83  BUN 35* 44*  CREATININE 1.90* 2.12*  CALCIUM 8.0* 8.1*  MG 2.1  --    Liver Function Tests:  Recent Labs  10/10/13 1204  AST 12  ALT 7  ALKPHOS 108  BILITOT 2.1*  PROT 6.1  ALBUMIN 3.1*   No results found for this basename: LIPASE, AMYLASE,  in the last 72 hours CBC:  Recent Labs  10/12/13 0420 10/13/13 0720  WBC 11.7* 10.6*  HGB 9.6* 9.5*  HCT 29.8* 28.3*  MCV 85.4 84.2  PLT 135* 140*   Cardiac Enzymes:  Recent Labs  10/10/13 1843 10/10/13 2255 10/11/13 0510  TROPONINI <0.30 <0.30 <0.30   Coag Panel:   Lab Results  Component Value Date   INR 2.00* 10/12/2013   INR 4.63* 10/12/2013   INR 4.91* 10/11/2013    RADIOLOGY: Ct Abdomen Pelvis Wo Contrast  10/10/2013   CLINICAL DATA:  Vomiting and diarrhea  for 3-4 days, generalized weakness, on Coumadin, history is CHF  EXAM: CT ABDOMEN AND PELVIS WITHOUT CONTRAST  TECHNIQUE: Multidetector CT imaging of the abdomen and pelvis was performed following the standard protocol without intravenous contrast.  COMPARISON:  Lumbar spine CT -06/15/2009  FINDINGS: The lack of intravenous contrast limits the ability to evaluate solid abdominal organs.  There is mild nodularity of the hepatic contour, nonspecific though could be seen in the setting of cirrhosis. This finding is associated with small volume of intra-abdominal ascites. Post cholecystectomy. There is apparent fusiform ectasia of the origin of the portal vein regional to the confluence of the superior mesenteric and splenic veins measuring approximately 2.8 cm in greatest oblique axial dimension (image 31, series 2).  The bilateral kidneys are slightly atrophic compatible with provided history of renal insufficiency. No discrete renal stones. No urinary obstruction or perinephric stranding. Normal noncontrast appearance of the bilateral adrenal glands. There is a crescentic approximately 1.6 x 1.4 cm presumed partially calcified splenic artery aneurysm (image 26, series 2). Additional smaller presumed splenic aneurysms are seen about the splenic hilum.  Scattered colonic diverticulosis without evidence of diverticulitis. The  colon is largely decompressed. The bowel is otherwise normal in course and caliber without discrete area of wall thickening or evidence of obstruction. The appendix is not definitely identified, however there is no definitive inflammatory change within the right lower abdominal quadrant. No pneumoperitoneum, pneumatosis or portal venous gas.  Atherosclerotic plaque within a normal caliber abdominal aorta. Scattered shotty retroperitoneal lymph nodes are individually not enlarged by size criteria. No retroperitoneal, mesenteric, pelvic or inguinal lymphadenopathy on this noncontrast examination.   The pelvic organs her expectantly atrophic. No discrete adnexal lesion.  Limited visualization of the lower thorax demonstrates minimal subsegmental atelectasis within the caudal aspect of the right middle lobe in an left lower lobe. No focal airspace opacities. No pleural effusion.  Cardiomegaly. Ventricular pacer leads are seen within the right atrium, ventricle and coronary sinus. No pericardial effusion.  No acute or aggressive osseous abnormalities. There is mild straightening expected lumbar lordosis. Grossly unchanged mild (approximately 25%) compression deformity involving the anterior aspect of the superior endplate of the L4 vertebral body. Grossly unchanged mild (approximately 5 mm) of retrolisthesis of L4 upon L5.  IMPRESSION: 1. Colonic diverticulosis without definite evidence of diverticulitis on this noncontrast examination. The colon is decompressed without definite evidence of obstruction. 2. Mild nodularity of the hepatic contour suggestive of cirrhosis. This finding is associated with small volume intra-abdominal ascites. 3. Incidental note made of an approximately 1.6 cm splenic artery aneurysm - this is of doubtful clinical concern in this postmenopausal patient. 4. Suspected fusiform ectasia of the origin of the portal vein, measuring approximately 1.8 cm, incompletely evaluated on this noncontrast examination and of uncertain clinical significant. 5. Cardiomegaly.   Electronically Signed   By: Sandi Mariscal M.D.   On: 10/10/2013 15:48   Dg Chest 2 View  10/10/2013   CLINICAL DATA:  Cough.  EXAM: CHEST  2 VIEW  COMPARISON:  November 29, 2007.  FINDINGS: Stable cardiomegaly. Status post mitral valve replacement. Left-sided pacemaker is unchanged in position. No pneumothorax or pleural effusion is noted. No acute pulmonary disease is noted. Bony thorax is intact.  IMPRESSION: No acute cardiopulmonary abnormality seen.   Electronically Signed   By: Sabino Dick M.D.   On: 10/10/2013 17:04     ASSESSMENT/PLAN:  79 year old female with prior mitral valve repair, previously described ejection fraction of 45% with cirrhosis of the liver, thrombocytopenia, abdominal discomfort, with new echocardiogram demonstrating possible ejection fraction of 35-40%.  1. Acute on chronic systolic/diastolic heart failure- Ejection fraction of 35-40% was a gross estimation however there did appear to be dysfunction present. Her mitral valve repair is intact on echo. Her creatinine bumped with IV Lasix and Lasix was held.  BMET pending this am.  She is still hypotensive this am.  I suspect that her low BP and worsening renal function are from decompensated CHF.  I&O's are inaccurate due to incontinence.  Will place a foley catheter.  I would like to start her on low dose dopamine and dobutamine to try to improve cardiac output and promote renal blood flow to help with worsening renal function and diuresis.  Would hold off for a day on surgery unless felt to be urgent.   2. Cirrhosis with ascites. Her abdomen is distended and she has had diarrhea. Cirrhosis most likely due to CHF but need to consider GI etiology. Appreciatte GI input 3. Perirectal abcess requiring drainage this am. If possible would hold off a day to try to get cardiac status mores stable unless felt to be urgent.  4. Severe MR S/P MV repair with Carpentier ring annuloplasty  5. PAF on chronic anticoagulation - coumadin reversed for surgery today 6. CKD stage III     Sherry Margarita, MD  10/13/2013  7:50 AM

## 2013-10-14 ENCOUNTER — Inpatient Hospital Stay (HOSPITAL_COMMUNITY): Payer: Medicare Other | Admitting: Certified Registered"

## 2013-10-14 ENCOUNTER — Encounter (HOSPITAL_COMMUNITY): Payer: Self-pay | Admitting: Physician Assistant

## 2013-10-14 ENCOUNTER — Encounter (HOSPITAL_COMMUNITY): Payer: Medicare Other | Admitting: Certified Registered"

## 2013-10-14 ENCOUNTER — Encounter (HOSPITAL_COMMUNITY)
Admission: EM | Disposition: A | Discharge status: Home / Self Care | Payer: Self-pay | Source: Home / Self Care | Attending: Internal Medicine

## 2013-10-14 DIAGNOSIS — Z950 Presence of cardiac pacemaker: Secondary | ICD-10-CM | POA: Diagnosis present

## 2013-10-14 DIAGNOSIS — K612 Anorectal abscess: Principal | ICD-10-CM

## 2013-10-14 HISTORY — PX: IRRIGATION AND DEBRIDEMENT ABSCESS: SHX5252

## 2013-10-14 LAB — CBC
HCT: 28.8 % — ABNORMAL LOW (ref 36.0–46.0)
HCT: 26.1 % — ABNORMAL LOW (ref 36.0–46.0)
MCH: 28.1 pg (ref 26.0–34.0)
MCHC: 33.3 g/dL (ref 30.0–36.0)
MCHC: 33.3 g/dL (ref 30.0–36.0)
MCV: 84.2 fL (ref 78.0–100.0)
Platelets: 171 10*3/uL (ref 150–400)
Platelets: 192 10*3/uL (ref 150–400)
RDW: 15.3 % (ref 11.5–15.5)
RDW: 15.4 % (ref 11.5–15.5)
WBC: 10.2 10*3/uL (ref 4.0–10.5)

## 2013-10-14 LAB — PREPARE FRESH FROZEN PLASMA: Unit division: 0

## 2013-10-14 LAB — PROTIME-INR
INR: 1.32 (ref 0.00–1.49)
Prothrombin Time: 16.1 seconds — ABNORMAL HIGH (ref 11.6–15.2)

## 2013-10-14 LAB — BASIC METABOLIC PANEL
BUN: 51 mg/dL — ABNORMAL HIGH (ref 6–23)
Calcium: 8.2 mg/dL — ABNORMAL LOW (ref 8.4–10.5)
Chloride: 101 mEq/L (ref 96–112)
GFR calc Af Amer: 27 mL/min — ABNORMAL LOW (ref >90)
GFR calc non Af Amer: 23 mL/min — ABNORMAL LOW (ref >90)
Potassium: 3.4 mEq/L — ABNORMAL LOW (ref 3.5–5.1)

## 2013-10-14 SURGERY — IRRIGATION AND DEBRIDEMENT ABSCESS
Anesthesia: General | Site: Perineum | Wound class: Dirty or Infected

## 2013-10-14 MED ORDER — FUROSEMIDE 10 MG/ML IJ SOLN
40.0000 mg | Freq: Two times a day (BID) | INTRAMUSCULAR | Status: DC
  Administered 2013-10-14: 40 mg via INTRAVENOUS
  Filled 2013-10-14: qty 4

## 2013-10-14 MED ORDER — BUPIVACAINE-EPINEPHRINE (PF) 0.25% -1:200000 IJ SOLN
INTRAMUSCULAR | Status: AC
Start: 2013-10-14 — End: 2013-10-14
  Filled 2013-10-14: qty 30

## 2013-10-14 MED ORDER — PROPOFOL 10 MG/ML IV BOLUS
INTRAVENOUS | Status: DC | PRN
  Administered 2013-10-14: 120 mg via INTRAVENOUS

## 2013-10-14 MED ORDER — HEMOSTATIC AGENTS (NO CHARGE) OPTIME
TOPICAL | Status: DC | PRN
  Administered 2013-10-14: 1 via TOPICAL

## 2013-10-14 MED ORDER — FUROSEMIDE 10 MG/ML IJ SOLN
40.0000 mg | Freq: Three times a day (TID) | INTRAMUSCULAR | Status: DC
  Administered 2013-10-14 – 2013-10-18 (×11): 40 mg via INTRAVENOUS
  Filled 2013-10-14 (×15): qty 4

## 2013-10-14 MED ORDER — LIDOCAINE HCL (CARDIAC) 20 MG/ML IV SOLN
INTRAVENOUS | Status: DC | PRN
  Administered 2013-10-14: 40 mg via INTRAVENOUS

## 2013-10-14 MED ORDER — POTASSIUM CHLORIDE CRYS ER 20 MEQ PO TBCR
40.0000 meq | EXTENDED_RELEASE_TABLET | Freq: Once | ORAL | Status: AC
  Administered 2013-10-14: 40 meq via ORAL
  Filled 2013-10-14: qty 2

## 2013-10-14 MED ORDER — LACTATED RINGERS IV SOLN
INTRAVENOUS | Status: DC | PRN
  Administered 2013-10-14: 11:00:00 via INTRAVENOUS

## 2013-10-14 MED ORDER — FENTANYL CITRATE 0.05 MG/ML IJ SOLN
INTRAMUSCULAR | Status: DC | PRN
  Administered 2013-10-14: 50 ug via INTRAVENOUS

## 2013-10-14 MED ORDER — 0.9 % SODIUM CHLORIDE (POUR BTL) OPTIME
TOPICAL | Status: DC | PRN
  Administered 2013-10-14: 1000 mL

## 2013-10-14 MED ORDER — FENTANYL CITRATE 0.05 MG/ML IJ SOLN
25.0000 ug | INTRAMUSCULAR | Status: DC | PRN
  Administered 2013-10-14: 50 ug via INTRAVENOUS
  Administered 2013-10-14 (×2): 25 ug via INTRAVENOUS

## 2013-10-14 MED ORDER — ONDANSETRON HCL 4 MG/2ML IJ SOLN
INTRAMUSCULAR | Status: DC | PRN
  Administered 2013-10-14: 4 mg via INTRAVENOUS

## 2013-10-14 MED ORDER — ONDANSETRON HCL 4 MG/2ML IJ SOLN: 4.0000 mg | Freq: Once | INTRAMUSCULAR | Status: DC | PRN

## 2013-10-14 MED ORDER — FENTANYL CITRATE 0.05 MG/ML IJ SOLN
INTRAMUSCULAR | Status: AC
  Filled 2013-10-14: qty 2

## 2013-10-14 SURGICAL SUPPLY — 49 items
BANDAGE GAUZE ELAST BULKY 4 IN (GAUZE/BANDAGES/DRESSINGS) ×2 IMPLANT
BLADE SURG 10 STRL SS (BLADE) ×2 IMPLANT
BLADE SURG 15 STRL LF DISP TIS (BLADE) ×1 IMPLANT
BLADE SURG 15 STRL SS (BLADE) ×1
CANISTER SUCTION 2500CC (MISCELLANEOUS) IMPLANT
CHLORAPREP W/TINT 26ML (MISCELLANEOUS) IMPLANT
CLEANER TIP ELECTROSURG 2X2 (MISCELLANEOUS) ×2 IMPLANT
COVER SURGICAL LIGHT HANDLE (MISCELLANEOUS) ×2 IMPLANT
DECANTER SPIKE VIAL GLASS SM (MISCELLANEOUS) IMPLANT
DRAPE EXTREMITY T 121X128X90 (DRAPE) IMPLANT
DRAPE LAPAROSCOPIC ABDOMINAL (DRAPES) ×2 IMPLANT
DRAPE PROXIMA HALF (DRAPES) ×8 IMPLANT
DRSG PAD ABDOMINAL 8X10 ST (GAUZE/BANDAGES/DRESSINGS) ×4 IMPLANT
ELECT REM PT RETURN 9FT ADLT (ELECTROSURGICAL) ×2
ELECTRODE REM PT RTRN 9FT ADLT (ELECTROSURGICAL) ×1 IMPLANT
GAUZE SPONGE 4X4 16PLY XRAY LF (GAUZE/BANDAGES/DRESSINGS) IMPLANT
GLOVE BIO SURGEON STRL SZ7 (GLOVE) ×6 IMPLANT
GLOVE BIO SURGEON STRL SZ7.5 (GLOVE) ×2 IMPLANT
GLOVE BIOGEL PI IND STRL 7.0 (GLOVE) ×1 IMPLANT
GLOVE BIOGEL PI IND STRL 7.5 (GLOVE) ×2 IMPLANT
GLOVE BIOGEL PI INDICATOR 7.0 (GLOVE) ×1
GLOVE BIOGEL PI INDICATOR 7.5 (GLOVE) ×2
GLOVE SURG SS PI 7.0 STRL IVOR (GLOVE) ×2 IMPLANT
GOWN STRL NON-REIN LRG LVL3 (GOWN DISPOSABLE) ×8 IMPLANT
HEMOSTAT SNOW SURGICEL 2X4 (HEMOSTASIS) ×2 IMPLANT
KIT BASIN OR (CUSTOM PROCEDURE TRAY) ×2 IMPLANT
KIT ROOM TURNOVER OR (KITS) ×2 IMPLANT
NEEDLE HYPO 25X1 1.5 SAFETY (NEEDLE) IMPLANT
NS IRRIG 1000ML POUR BTL (IV SOLUTION) ×2 IMPLANT
PACK SURGICAL SETUP 50X90 (CUSTOM PROCEDURE TRAY) ×2 IMPLANT
PAD ARMBOARD 7.5X6 YLW CONV (MISCELLANEOUS) ×4 IMPLANT
PENCIL BUTTON HOLSTER BLD 10FT (ELECTRODE) ×2 IMPLANT
SPECIMEN JAR SMALL (MISCELLANEOUS) ×2 IMPLANT
SPONGE GAUZE 4X4 12PLY (GAUZE/BANDAGES/DRESSINGS) ×2 IMPLANT
SPONGE LAP 18X18 X RAY DECT (DISPOSABLE) ×6 IMPLANT
SUT MNCRL AB 4-0 PS2 18 (SUTURE) IMPLANT
SUT VIC AB 2-0 FS1 27 (SUTURE) ×4 IMPLANT
SUT VIC AB 2-0 SH 18 (SUTURE) ×2 IMPLANT
SUT VIC AB 3-0 SH 27 (SUTURE)
SUT VIC AB 3-0 SH 27XBRD (SUTURE) IMPLANT
SWAB COLLECTION DEVICE MRSA (MISCELLANEOUS) ×2 IMPLANT
SYR BULB 3OZ (MISCELLANEOUS) ×2 IMPLANT
SYR CONTROL 10ML LL (SYRINGE) IMPLANT
TOWEL OR 17X24 6PK STRL BLUE (TOWEL DISPOSABLE) ×4 IMPLANT
TOWEL OR 17X26 10 PK STRL BLUE (TOWEL DISPOSABLE) ×2 IMPLANT
TUBE ANAEROBIC SPECIMEN COL (MISCELLANEOUS) ×2 IMPLANT
TUBE CONNECTING 12X1/4 (SUCTIONS) IMPLANT
WATER STERILE IRR 1000ML POUR (IV SOLUTION) IMPLANT
YANKAUER SUCT BULB TIP NO VENT (SUCTIONS) IMPLANT

## 2013-10-14 NOTE — Progress Notes (Signed)
  Subjective: Breathing fine this am, hurts where infection is  Objective: Vital signs in last 24 hours: Temp:  [97.8 F (36.6 C)-98.6 F (37 C)] 97.8 F (36.6 C) (11/28 0349) Pulse Rate:  [69-73] 69 (11/28 0830) Resp:  [18-23] 20 (11/28 0830) BP: (112-124)/(36-53) 115/49 mmHg (11/28 0830) SpO2:  [93 %-98 %] 98 % (11/28 0830) Weight:  [238 lb 8.6 oz (108.2 kg)] 238 lb 8.6 oz (108.2 kg) (11/28 0700) Last BM Date: 10/11/13  Intake/Output from previous day: 11/27 0701 - 11/28 0700 In: 593.6 [I.V.:193.6; IV Piggyback:400] Out: 1300 [Urine:1300] Intake/Output this shift: Total I/O In: 9 [I.V.:9] Out: -   General appearance: no distress Resp: occ bilateral rales and some wheezes  Cardio: regular rate and rhythm  Lab Results:   Recent Labs  10/12/13 0420 10/13/13 0720  WBC 11.7* 10.6*  HGB 9.6* 9.5*  HCT 29.8* 28.3*  PLT 135* 140*   BMET  Recent Labs  10/12/13 0420 10/13/13 0720  NA 138 134*  K 3.8 3.5  CL 103 99  CO2 21 20  GLUCOSE 83 103*  BUN 44* 55*  CREATININE 2.12* 2.47*  CALCIUM 8.1* 8.0*   PT/INR  Recent Labs  10/13/13 0720 10/14/13 0350  LABPROT 18.6* 16.1*  INR 1.60* 1.32    Anti-infectives: Anti-infectives   Start     Dose/Rate Route Frequency Ordered Stop   10/14/13 1300  vancomycin (VANCOCIN) 1,500 mg in sodium chloride 0.9 % 500 mL IVPB     1,500 mg 250 mL/hr over 120 Minutes Intravenous Every 48 hours 10/12/13 1131     10/12/13 1300  vancomycin (VANCOCIN) 2,000 mg in sodium chloride 0.9 % 500 mL IVPB     2,000 mg 250 mL/hr over 120 Minutes Intravenous  Once 10/12/13 1131 10/12/13 1500   10/12/13 1230  piperacillin-tazobactam (ZOSYN) IVPB 2.25 g     2.25 g 100 mL/hr over 30 Minutes Intravenous Every 6 hours 10/12/13 1131     10/11/13 0600  cefTRIAXone (ROCEPHIN) 1 g in dextrose 5 % 50 mL IVPB  Status:  Discontinued     1 g 100 mL/hr over 30 Minutes Intravenous Every 24 hours 10/10/13 1828 10/12/13 1021   10/10/13 1530   cefTRIAXone (ROCEPHIN) 1 g in dextrose 5 % 50 mL IVPB     1 g 100 mL/hr over 30 Minutes Intravenous  Once 10/10/13 1529 10/10/13 1635      Assessment/Plan: Perineal abscess  I think should be ready for or this am if ok with cardiology now.  We discussed anesthesia and drainage of perineal abscess with resulting wound requiring dressing changes.  There is still certainly some cardiac risk to this and we discussed this as well but I think if she is optimized this area should be evacuated today.  [facility] 10/14/2013

## 2013-10-14 NOTE — Anesthesia Postprocedure Evaluation (Signed)
  Anesthesia Post-op Note  Patient: [name]  Procedure(s) Performed: Procedure(s): IRRIGATION AND DEBRIDEMENT PERINEAL ABSCESS (N/A)  Patient Location: PACU  Anesthesia Type:General  Level of Consciousness: awake, alert  and oriented  Airway and Oxygen Therapy: Patient Spontanous Breathing  Post-op Pain: mild  Post-op Assessment: Post-op Vital signs reviewed  Post-op Vital Signs: Reviewed  Complications: No apparent anesthesia complications

## 2013-10-14 NOTE — Progress Notes (Signed)
[facility] PROGRESS NOTE  [name] [id number] DOB: Mar 29, 1926 DOA: 10/10/2013 PCP: No primary provider on file.  Assessment/Plan:  Left labia, perirectal Abscess: Continue with  Vancomycin and Zosyn day 3. Surgery consulted. INR 1.6.  WBC trending down. For abscess drainage today.   Liver cirrhosis on imaging - patient without known history of any liver disease. She has ascites, elevated total bilirubin, thrombocytopenia.  viral hepatitis panel negative. GI consulted. Dr Percell Miller think cirrhosis could be due to NASH. No further work up at this point.   Urinary tract infection - patient started on empiric ceftriaxone on admission. UA with 21 to 50 WBC. Change ceftriaxone to Zosyn due to # 1. Urine culture grew multiple bacterial morphotype. Re send urine culture.   Acute on chronic renal Failure: Last cr per records at 1.3. Cr on admission at 1.4.  -Cr increase to 2. 4. Renal function improved on dopamine. Started on lasix today.  Acute on chronic systolic heart failure - last echocardiogram that we have in system was back in 2008. BNP elevated last night. 2D echo done this morning shows further decrease in her EF to 35-40% and anteroseptal hypokinesis. Cardiology consulted, appreciate input. -Started on dobutamine, dopamine. Appreciate Dr.  Radford Pax assistance. Started on IV Lasix today.  Nausea/vomiting - due to probably acute illness. Resolved.   Diarrhea; Resolved. C diff negative. No BM today. She usually has 3 BM per day.   Chronic anemia: follow hb trend. HB stable at 9.   S/p mitral valve annuloplasty   Atrial fibrillation -Continue to hold Coumadin. INR at 1.6. Resume coumadin when ok by cardio.   Status post pacemaker   Chronic anticoagulation -will defer to cardio continuation of anticoagulation.  Thrombocytopenia - due to infection process.  Improving.    Code Status: DNR Family Communication: Care discussed with patient.  Disposition Plan: Remain step  Down.    Consultants:  Surgery.  Cardiology  GI  Procedures:  none  Antibiotics:  Ceftriaxone 11-25  Zosyn 11-26  Vancomycin 11-26  HPI/Subjective: She is feeling better today. No worsening dyspnea. Has notice weight gain.    Objective: Filed Vitals:   10/14/13 0349  BP: 113/36  Pulse: 69  Temp: 97.8 F (36.6 C)  Resp: 18    Intake/Output Summary (Last 24 hours) at 10/14/13 0803 Last data filed at 10/14/13 0700  Gross per 24 hour  Intake    400 ml  Output   1300 ml  Net   -900 ml   Filed Weights   10/11/13 0500 10/12/13 0449 10/14/13 0700  Weight: 106.1 kg (233 lb 14.5 oz) 106 kg (233 lb 11 oz) 108.2 kg (238 lb 8.6 oz)    Exam:   General:  No distress  Cardiovascular: S 1, S 2 RRR  Respiratory: decrease breath sounds, crackles bases.   Abdomen: Bs present, soft, NT, distended.   Musculoskeletal: no edema.  Pelvic; Left labia with swelling, redness, left perirectal area with redness.   Data Reviewed: Basic Metabolic Panel:  Recent Labs Lab 10/10/13 1204 10/11/13 1149 10/12/13 0420 10/13/13 0720  NA 141 139 138 134*  K 3.5 3.8 3.8 3.5  CL 108 104 103 99  CO2 21 22 21 20   GLUCOSE 109* 133* 83 103*  BUN 25* 35* 44* 55*  CREATININE 1.49* 1.90* 2.12* 2.47*  CALCIUM 8.0* 8.0* 8.1* 8.0*  MG  --  2.1  --   --    Liver Function Tests:  Recent Labs Lab 10/10/13 1204  AST  12  ALT 7  ALKPHOS 108  BILITOT 2.1*  PROT 6.1  ALBUMIN 3.1*   No results found for this basename: LIPASE, AMYLASE,  in the last 168 hours No results found for this basename: AMMONIA,  in the last 168 hours CBC:  Recent Labs Lab 10/10/13 1204 10/11/13 1149 10/12/13 0420 10/13/13 0720  WBC 10.8* 11.7* 11.7* 10.6*  HGB 9.3* 10.0* 9.6* 9.5*  HCT 28.4* 30.6* 29.8* 28.3*  MCV 86.1 86.7 85.4 84.2  PLT 111* 124* 135* 140*   Cardiac Enzymes:  Recent Labs Lab 10/10/13 1843 10/10/13 2255 10/11/13 0510  TROPONINI <0.30 <0.30 <0.30   BNP (last 3  results)  Recent Labs  10/10/13 1634 10/13/13 1113  PROBNP 6217.0* 8553.0*   CBG: No results found for this basename: GLUCAP,  in the last 168 hours  Recent Results (from the past 240 hour(s))  URINE CULTURE     Status: None   Collection Time    10/10/13  2:32 PM      Result Value Range Status   Specimen Description URINE, RANDOM   Final   Special Requests NONE   Final   Culture  Setup Time     Final   Value: 10/10/2013 20:40     Performed at [place]     Final   Value: >=100,000 COLONIES/ML     Performed at [facility]   Culture     Final   Value: Multiple bacterial morphotypes present, none predominant. Suggest appropriate recollection if clinically indicated.     Performed at [facility]   Report Status 10/12/2013 FINAL   Final  MRSA PCR SCREENING     Status: None   Collection Time    10/10/13  6:24 PM      Result Value Range Status   MRSA by PCR NEGATIVE  NEGATIVE Final   Comment:            The GeneXpert MRSA Assay (FDA     approved for NASAL specimens     only), is one component of a     comprehensive MRSA colonization     surveillance program. It is not     intended to diagnose MRSA     infection nor to guide or     monitor treatment for     MRSA infections.  CLOSTRIDIUM DIFFICILE BY PCR     Status: None   Collection Time    10/11/13  8:38 PM      Result Value Range Status   C difficile by pcr NEGATIVE  NEGATIVE Final     Studies: No results found.  Scheduled Meds: . beta carotene w/minerals  1 tablet Oral Daily  . colchicine  0.6 mg Oral Daily  . digoxin  0.0625 mg Oral Daily  . ferrous sulfate  325 mg Oral Q breakfast  . HYDROcodone-homatropine  5 mL Oral BID  . loratadine  10 mg Oral Daily  . piperacillin-tazobactam (ZOSYN)  IV  2.25 g Intravenous Q6H  . sodium chloride  3 mL Intravenous Q12H  . vancomycin  1,500 mg Intravenous Q48H   Continuous Infusions: . DOBUTamine 2.5 mcg/kg/min (10/14/13  0700)  . DOPamine 2.5 mcg/kg/min (10/14/13 0700)    Active Problems:   Chronic anticoagulation   Liver cirrhosis   Thrombocytopenia   Nausea and vomiting   Acute on chronic combined systolic and diastolic congestive heart failure   UTI (urinary tract infection)   CKD (chronic kidney  disease) stage 3, GFR 30-59 ml/min   Anemia   Atrial fibrillation   Perirectal abscess    Time spent: 35 minutes.     Georgina Krist  [facility] Pager [phone]. If 7PM-7AM, please contact night-coverage at [email or web address], password [email or web address] 10/14/2013, 8:03 AM  LOS: 4 days

## 2013-10-14 NOTE — Transfer of Care (Signed)
Immediate Anesthesia Transfer of Care Note  Patient: [name]  Procedure(s) Performed: Procedure(s): IRRIGATION AND DEBRIDEMENT PERINEAL ABSCESS (N/A)  Patient Location: PACU  Anesthesia Type:General  Level of Consciousness: awake, alert  and oriented  Airway & Oxygen Therapy: Patient Spontanous Breathing  Post-op Assessment: Report given to PACU RN  Post vital signs: Reviewed and stable  Complications: No apparent anesthesia complications

## 2013-10-14 NOTE — Progress Notes (Signed)
PT Cancellation Note  Patient Details Name: MARLINDA SUSSMAN MRN: [id number] DOB: 07/03/1926   Cancelled Treatment:    Reason Eval/Treat Not Completed: Medical issues which prohibited therapy.  Pt in surgery for drainage of perirectal abscess.  Will check back at later date.   Thanks.     INGOLD,Dynasty Holquin 10/14/2013, 10:28 AM [facility] [phone] (pager)

## 2013-10-14 NOTE — Consult Note (Addendum)
Advanced Heart Failure Consultation Note  Patient ID: [name], MRN: [id number], DOB/AGE: 08/04/1926 79 y.o. Admit date: 10/10/2013   Date of Consult: 10/14/2013 Primary Physician: No primary provider on file. Primary Cardiologist: [name]  Chief Complaint: nausea, vomiting, weakness Reason for Consult: a/c combined CHF in a patient found to have CHF, newly recognized cirrhosis, perirectal abcess, a/c kidney disease - on dobutamine/dopamine  HPI: [name] is an 79 y/o F with history of chronic combined systolic/diastolic CHF, MV repair c/b symptomatic bradycardia requiring pacemaker in 2003 with Bi-V upgrade 2007, paroxysmal atrial fibrillation (tried on Tikosyn/amiodarone), newly recognized cirrhosis who presented to [facility] 10/10/2013 with nausea, vomiting and weakness for 3 days. She had denied syncope, lightheadedness, but endorsed 15lb weight gain, mild SOB, abdominal swelling, and some diarrhea. In the ED, CT abdomen showed colonic diverticulosis, newly diagnosed cirrhosis, small-volume ascites,  and cardiomegaly. Cardiology was initially consulted on 10/11/13 who also found the patient to be in acute on chronic combined CHF. 2D Echo with challenging windows demonstrated EF 35-40% with hypokinesis of the anteroseptal myocardium, mild MR, mod dil LA, PA pressure 74mmHg. She was given a dose of Lasix but her Cr had bumped. Further diuretics were held due to this. Hypotension has also limited diuresis or beta blocker use.  Dr. Radford Pax started her on dopamine/dobutamine yesterday in hopes of improving cardiac output. Digoxin was stopped due to renal insufficiency (peak Cr 2.47 - dig level 0.6 on 11/24 - Cr 1.88 today). She was also found to have a perirectal abscess extending into her vulva. She was placed on antibiotics and Coumadin was reversed in prep for surgery. General surgery took her to OR for I&D today. It has been felt that her cirrhosis is either due to NASH or CHF.  The CHF team is asked for their input on further recommendations.  In talking with pt and family, previous dry weight was around 215 but she hasn't been that way for about 6 months. She hasn't been weighing herself regularly at home. She eats quite a bit of salt. She denies any CP or syncope. Does have occasional palpitations. She reports that pacemaker was checked in the last 6 months and was reported to be functioning normally with 2 more years battery life.   Past Medical History  Diagnosis Date  . CHF (congestive heart failure)     a. Chronic combined systolic/diastolic CHF EF [id number] in 09/2006.  [name] Hypertension   . Mitral valve disorder     a. Severe MR s/p repair 2003 (28 mm annuloplasty ring and and oversew of LAA). No CAD by cath at that time.  . Atrial fibrillation     a. Previously failed DCCV on amiodarone. b. Previously on Tikosyn -started 2008.  [name] Pacemaker     a. 2003: post-op afib after MR repair then symptomatic bradycardia requiring pacemaker. b. Upgrade to [employer] Bi-V Pacemaker 2007.  [name] Cirrhosis     a. Newly recognized 09/2013 - by CT.  . CKD (chronic kidney disease)   . NSVT (nonsustained ventricular tachycardia)     a. Isolated event during 09/2007 adm.      Most Recent Cardiac Studies: Cardiac Cath 2007 CARDIAC CATHETERIZATION  REFERRING PHYSICIAN: Hal T. Stoneking, M.D.  PROCEDURE: Is a right and left heart catheterization.  OPERATOR: [name], M.D.  INDICATIONS: Dyspnea as well as mitral valve disease status post mitral  valve repair.  OPERATOR: [name], M.D.  COMPLICATIONS: None.  IV ACCESS: Via right femoral artery  6-French sheath and right femoral  vein 7-French sheath.  This is a very pleasant 79 year old white female with obesity and  history of mitral regurgitation status post mitral valve repair as well  as LV dysfunction secondary to MR and normal coronary arteries by cath  several years ago who now presents with persistent shortness of  breath  felt probably secondary to atrial fibrillation. She does have a  biventricular pacemaker and recently developed new onset of atrial  fibrillation and nausea and amiodarone was discontinued. She now  presents for further evaluation for pulmonary hypertension.  The patient is brought to cardiac catheterization laboratory in a  fasting nonsedated state. Informed consent was obtained. The patient  was connected to continuous heart rate and pulse oximetry monitoring and  intermittent blood pressure monitoring. The right groin was prepped and  draped in sterile fashion. 1% Xylocaine was used for local anesthesia.  Using modified Seldinger technique a 6-French sheath was placed in right  femoral artery. Using modified Seldinger technique a 7-French sheath  was placed in the right femoral vein. Under fluoroscopic guidance a 7-  [place] Swan-Ganz catheter was placed under balloon flotation into the  right atrium, right atrial pressure was measured and right atrial O2  saturations were obtained. Catheter was then advanced into the right  ventricle and right ventricular pressure and O2 saturations were  obtained. The catheter was then advanced into the pulmonary artery and  into the pulmonary capillary wedge position where pulmonary capillary  wedge pressure was obtained. The balloon was then deflated, the  catheter pulled back into the main pulmonary artery and pulmonary artery  pressures and O2 saturations were obtained. The catheter was then  removed under fluoroscopic guidance and then a 6-French pigtail catheter  was placed in the left ventricular cavity. Left ventricular pressure  was measured. Left ventriculography was not performed because of the  patient's renal function. The catheter was then pulled back across the  aortic valve with no significant gradient noted. At the end of  procedure all catheters and sheaths were removed. Manual compression  was performed so adequate  hemostasis was obtained. The patient was  transferred back to room in stable condition.  RESULTS:  1. The right atrial pressure is 19/19 with a mean of 17 mmHg. RV  pressure 41/11 with a mean of 15 mmHg. PA pressure 41/19 with a  mean of 30 mmHg. Pulmonary capillary wedge 22/26 with a mean of 21  mmHg. LV pressure 134/10 mmHg, LVEDP 16-18 mmHg, aortic pressure  135/66 mmHg. O2 saturations; RA 59%, RV 60%, PA 63% and AO 93%.  Cardiac output by Fick 4.0, by thermodilution 5.3. Cardiac index by  Fick 1.9, cardiac index by thermodilution 2.5.  ASSESSMENT:  1. Dyspnea.  2. Mildly elevated right heart pressures most likely secondary to  elevated LVEDP with diastolic dysfunction.  3. Mild LV dysfunction by echo. I suspect shortness of breath is  multifactorial secondary to obesity, deconditioning, diastolic  dysfunction, atrial fibrillation with loss of atrial kick.  PLAN: Restart Coreg 6.25 mg b.i.d. for diastolic dysfunction to  decrease heart rate and promote diastolic filling time. Restart Lasix 20  mg a day. We will discuss rhythm management with Dr. Caryl Comes and most  likely discharge home today.    2D Echo 10/11/13 - Left ventricle: The cavity size was mildly dilated. Systolic function was moderately reduced. The estimated ejection fraction was in the range of 35% to 40%. Challenging to estimate given poor windows. There is hypokinesis  of the anteroseptal myocardium. - Mitral valve: Moderately calcified annulus. Mildly thickened leaflets . Mild regurgitation. - Left atrium: The atrium was moderately dilated. - Right ventricle: The cavity size was mildly dilated. Wall thickness was normal. - Right atrium: The atrium was mildly dilated. - Pulmonary arteries: PA peak pressure: 79mm Hg (S).   Surgical History:  Past Surgical History  Procedure Laterality Date  . Valve replacement    . Cholecystectomy    . Appendectomy       Home Meds: Prior to Admission medications     Medication Sig Start Date End Date Taking? Authorizing Provider  acetaminophen (TYLENOL) 500 MG tablet Take 500 mg by mouth every 6 (six) hours as needed for mild pain.   Yes Historical Provider, MD  beta carotene w/minerals (OCUVITE) tablet Take 1 tablet by mouth daily.   Yes Historical Provider, MD  carvedilol (COREG) 12.5 MG tablet Take 12.5 mg by mouth 2 (two) times daily with a meal.   Yes Historical Provider, MD  cetirizine (ZYRTEC) 10 MG tablet Take 10 mg by mouth daily.   Yes Historical Provider, MD  colchicine 0.6 MG tablet Take 0.6 mg by mouth daily.   Yes Historical Provider, MD  digoxin (LANOXIN) 0.125 MG tablet Take 0.0625 mg by mouth daily.   Yes Historical Provider, MD  ferrous sulfate 325 (65 FE) MG tablet Take 325 mg by mouth daily with breakfast.   Yes Historical Provider, MD  furosemide (LASIX) 20 MG tablet Take 20-40 mg by mouth daily as needed (for swelling).   Yes Historical Provider, MD  Oxymetazoline HCl (NOSTRILLA CONGESTION RELIEF NA) Place 1 spray into the nose daily as needed (for congestion).   Yes Historical Provider, MD  potassium chloride SA (K-DUR,KLOR-CON) 20 MEQ tablet Take 20 mEq by mouth daily.   Yes Historical Provider, MD  warfarin (COUMADIN) 5 MG tablet Take 2.5-5 mg by mouth daily. Take 2.5mg  daily, except take 5mg  on Mondays.   Yes Historical Provider, MD    Inpatient Medications:  . beta carotene w/minerals  1 tablet Oral Daily  . colchicine  0.6 mg Oral Daily  . fentaNYL      . ferrous sulfate  325 mg Oral Q breakfast  . furosemide  40 mg Intravenous BID  . loratadine  10 mg Oral Daily  . piperacillin-tazobactam (ZOSYN)  IV  2.25 g Intravenous Q6H  . sodium chloride  3 mL Intravenous Q12H  . vancomycin  1,500 mg Intravenous Q48H   . DOBUTamine 5 mcg/kg/min (10/14/13 0946)  . DOPamine 2.5 mcg/kg/min (10/14/13 0700)    Allergies: No Known Allergies  History   Social History  . Marital Status: Single    Spouse Name: N/A    Number of  Children: N/A  . Years of Education: N/A   Occupational History  . Not on file.   Social History Main Topics  . Smoking status: Former Smoker    Quit date: 10/11/1971  . Smokeless tobacco: Not on file  . Alcohol Use: Yes     Comment: occasional  . Drug Use: Not on file  . Sexual Activity: Not on file   Other Topics Concern  . Not on file   Social History Narrative  . No narrative on file     Family History  Problem Relation Age of Onset  . Pancreatic cancer Father   . Other Mother     Mother died at 5 with no real medical problems     Review of Systems: General:  negative for chills, fever, night sweats or weight changes.  Cardiovascular: see above Dermatological: negative for rash Respiratory: negative for cough or wheezing Abdominal: negative for nausea, vomiting, diarrhea, melena, or hematemesis. occ BRBPR when she has hemorrhoids. Neurologic: negative for visual changes, syncope, or dizziness All other systems reviewed and are otherwise negative except as noted above.  Labs:  Lab Results  Component Value Date   WBC 9.9 10/14/2013   HGB 9.6* 10/14/2013   HCT 28.8* 10/14/2013   MCV 84.2 10/14/2013   PLT 171 10/14/2013    Recent Labs Lab 10/10/13 1204  10/14/13 0905  NA 141  < > 136  K 3.5  < > 3.4*  CL 108  < > 101  CO2 21  < > 20  BUN 25*  < > 51*  CREATININE 1.49*  < > 1.88*  CALCIUM 8.0*  < > 8.2*  PROT 6.1  --   --   BILITOT 2.1*  --   --   ALKPHOS 108  --   --   ALT 7  --   --   AST 12  --   --   GLUCOSE 109*  < > 96  < > = values in this interval not displayed.  Radiology/Studies:  Ct Abdomen Pelvis Wo Contrast  10/10/2013   CLINICAL DATA:  Vomiting and diarrhea for 3-4 days, generalized weakness, on Coumadin, history is CHF  EXAM: CT ABDOMEN AND PELVIS WITHOUT CONTRAST  TECHNIQUE: Multidetector CT imaging of the abdomen and pelvis was performed following the standard protocol without intravenous contrast.  COMPARISON:  Lumbar spine CT  -06/15/2009  FINDINGS: The lack of intravenous contrast limits the ability to evaluate solid abdominal organs.  There is mild nodularity of the hepatic contour, nonspecific though could be seen in the setting of cirrhosis. This finding is associated with small volume of intra-abdominal ascites. Post cholecystectomy. There is apparent fusiform ectasia of the origin of the portal vein regional to the confluence of the superior mesenteric and splenic veins measuring approximately 2.8 cm in greatest oblique axial dimension (image 31, series 2).  The bilateral kidneys are slightly atrophic compatible with provided history of renal insufficiency. No discrete renal stones. No urinary obstruction or perinephric stranding. Normal noncontrast appearance of the bilateral adrenal glands. There is a crescentic approximately 1.6 x 1.4 cm presumed partially calcified splenic artery aneurysm (image 26, series 2). Additional smaller presumed splenic aneurysms are seen about the splenic hilum.  Scattered colonic diverticulosis without evidence of diverticulitis. The colon is largely decompressed. The bowel is otherwise normal in course and caliber without discrete area of wall thickening or evidence of obstruction. The appendix is not definitely identified, however there is no definitive inflammatory change within the right lower abdominal quadrant. No pneumoperitoneum, pneumatosis or portal venous gas.  Atherosclerotic plaque within a normal caliber abdominal aorta. Scattered shotty retroperitoneal lymph nodes are individually not enlarged by size criteria. No retroperitoneal, mesenteric, pelvic or inguinal lymphadenopathy on this noncontrast examination.  The pelvic organs her expectantly atrophic. No discrete adnexal lesion.  Limited visualization of the lower thorax demonstrates minimal subsegmental atelectasis within the caudal aspect of the right middle lobe in an left lower lobe. No focal airspace opacities. No pleural  effusion.  Cardiomegaly. Ventricular pacer leads are seen within the right atrium, ventricle and coronary sinus. No pericardial effusion.  No acute or aggressive osseous abnormalities. There is mild straightening expected lumbar lordosis. Grossly unchanged mild (approximately 25%) compression deformity involving the anterior aspect of the  superior endplate of the L4 vertebral body. Grossly unchanged mild (approximately 5 mm) of retrolisthesis of L4 upon L5.  IMPRESSION: 1. Colonic diverticulosis without definite evidence of diverticulitis on this noncontrast examination. The colon is decompressed without definite evidence of obstruction. 2. Mild nodularity of the hepatic contour suggestive of cirrhosis. This finding is associated with small volume intra-abdominal ascites. 3. Incidental note made of an approximately 1.6 cm splenic artery aneurysm - this is of doubtful clinical concern in this postmenopausal patient. 4. Suspected fusiform ectasia of the origin of the portal vein, measuring approximately 1.8 cm, incompletely evaluated on this noncontrast examination and of uncertain clinical significant. 5. Cardiomegaly.   Electronically Signed   By: Sandi Mariscal M.D.   On: 10/10/2013 15:48   Dg Chest 2 View 10/10/2013   CLINICAL DATA:  Cough.  EXAM: CHEST  2 VIEW  COMPARISON:  November 29, 2007.  FINDINGS: Stable cardiomegaly. Status post mitral valve replacement. Left-sided pacemaker is unchanged in position. No pneumothorax or pleural effusion is noted. No acute pulmonary disease is noted. Bony thorax is intact.  IMPRESSION: No acute cardiopulmonary abnormality seen.   Electronically Signed   By: Sabino Dick M.D.   On: 10/10/2013 17:04   EKG: V-paced 71bpm   Physical Exam: Blood pressure 142/53, pulse 70, temperature 97.7 F (36.5 C), temperature source Oral, resp. rate 17, height 5\' 3"  (1.6 m), weight 238 lb 8.6 oz (108.2 kg), last menstrual period 10/10/2013, SpO2 100.00%. General: Obese, chronically  ill. Head: Normocephalic, atraumatic, sclera non-icteric, no xanthomas, nares are without discharge.  Neck: Negative for carotid bruits. JVP 14-16 cm Lungs: Slight crackles at bases bilaterally Heart: RRR with S1 S2. No murmurs, rubs, or gallops appreciated. Abdomen: Moderately distended, no tenderness. Extremities: No clubbing or cyanosis. 2+ edema to knees.  Unable to palpate pulses  due to edema Neuro: Alert and oriented X 3. No facial asymmetry. No focal deficit. Moves all extremities spontaneously. Psych:  Responds to questions appropriately with a normal affect.   Assessment and Plan:   1. Acute on chronic combined systolic - CHF EF [id number] 2. Perirectal abscess s/p I&D POD #0 3. Cirrhosis with ascites, newly recognized 4. PAF, on chronic anticoag 5. H/o symptomatic bradycardia s/p Bi-V pacemaker 6. Severe MR s/p MV repair 2003 7. Acute renal insufficiency on CKD (baseline unclear) 8. HTN 9. Anemia, appears chronic  Signed, Melina Copa PA-C 10/14/2013, 3:03 PM  Patient seen with PA, agree with the above note.  1. 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  She is very volume overloaded on exam.  SBP in 140s on both dopamine and dobutamine.  Creatinine is better today on dobutamine/dopamine (added because of AKI and hypotension).   - She needs diuresis.  Would start with Lasix 40 mg IV every 8 hrs. - Stop dopamine but continue dobutamine at current dose.  - Place PICC, follow CVP and check co-ox.   - EF seems to be lower than past but echo difficult.  Had no significant CAD on cath about 10 years ago.  Would hold off on LHC for now with AKI.  Would plan RHC once she is better-diuresed.  - No beta blocker given dobutamine use, no ACEI with AKI.  2. AKI on CKD: Creatinine improved on dopamine/dobutamine.  Continue to follow with diuresis.  May improve with decreasing renal venous pressure with diuresis.   3. Atrial fibrillation: Chronic.  Paced rhythm,  rate stable despite inotropes.  Start warfarin when stable from  surgical standpoint.  4. Cirrhosis: Possibly due to right heart failure/hepatic congestion.  Only mild amount of ascites noted on CT.  5. Peri-rectal abscess: s/p surgery today.  6. MV repair: Stable without significant MR on last echo.   [name] 10/14/2013 3:30 PM

## 2013-10-14 NOTE — Op Note (Signed)
Preoperative diagnosis: Perineal abscess Postoperative diagnoses: Same as above Procedure: Incision and drainage of perirectal abscess that had fistulized to the vagina Surgeon: Dr. Serita Grammes Anesthesia: Gen. Estimated blood loss: 300 cc Drains: None Complications: None Specimens: Cultures to microbiology Sponge and needle count was correct at completion Disposition to step down unit  Indications: This 79 year old female with multiple medical problems on Coumadin who came in with a perineal abscess. This area had spontaneous drained some. She was put into heart failure once she received plasma reversing her INR. She was maximized yesterday by cardiology and today we discussed going to the operating room to take care of this perineal abscess.  Procedure: After informed consent was obtained the patient was taken to the operating room. She was receiving antibiotics. Sequential compression devices were placed. She underwent general anesthesia without complication. Her perineum was then prepped and draped in the standard sterile surgical fashion after being placed in lithotomy and appropriate padded. A surgical timeout was performed.  She had an area that spontaneously drained externa.ly at about 2:00 on the clock face looking at her rectum. This area and actually fistulized just to the inner aspect of the lateral portion of the vagina. I made an elliptical incision and opened the area and the perirectal region. There was purulence cultured. I debrided some dead material here. There was a lot of bleeding in this portion of the procedure despite her inr being normal. Every time I probed there was bleeding. This tracked to her mons pubis and I opened this up superiorly as well. Eventually I connected the area of my incision to the vagina. I spent some time obtaining hemostasis and placed about 10 Vicryl sutures in different vessels that were bleeding and were not amenable to cautery. Eventually I was  able to obtain hemostasis. It appeared that I had this adequately drained. I packed this. I then placed a dressing. She was awakened in the operating room and transferred to recovery.

## 2013-10-14 NOTE — Anesthesia Preprocedure Evaluation (Addendum)
Anesthesia Evaluation  Patient identified by MRN, date of birth, ID band Patient awake    Reviewed: Allergy & Precautions, H&P , NPO status , Patient's Chart, lab work & pertinent test results  Airway Mallampati: I TM Distance: >3 FB Neck ROM: Full    Dental  (+) Edentulous Upper, Edentulous Lower and Dental Advisory Given   Pulmonary shortness of breath and at rest, former smoker,  breath sounds clear to auscultation        Cardiovascular hypertension, +CHF + dysrhythmias + pacemaker Rhythm:Regular  S/p MVR    Neuro/Psych    GI/Hepatic   Endo/Other    Renal/GU Renal disease     Musculoskeletal   Abdominal   Peds  Hematology  (+) anemia ,   Anesthesia Other Findings   Reproductive/Obstetrics                         Anesthesia Physical Anesthesia Plan  ASA: IV  Anesthesia Plan: General   Post-op Pain Management:    Induction: Intravenous  Airway Management Planned: LMA  Additional Equipment:   Intra-op Plan:   Post-operative Plan: Extubation in OR  Informed Consent: I have reviewed the patients History and Physical, chart, labs and discussed the procedure including the risks, benefits and alternatives for the proposed anesthesia with the patient or authorized representative who has indicated his/her understanding and acceptance.   Dental advisory given  Plan Discussed with: CRNA, Anesthesiologist and Surgeon  Anesthesia Plan Comments: (Will continue with pressure support using IV meds as needed.)        Anesthesia Quick Evaluation

## 2013-10-14 NOTE — Preoperative (Signed)
Beta Blockers   Reason not to administer Beta Blockers:Not Applicable 

## 2013-10-14 NOTE — Progress Notes (Addendum)
Left message with [employer] for interrogation of pacer and awaiting call back. Will post in epic what the interrogation shows once I hear from them. Emilea Goga PA-C  Addendum: Device interrogation OK. She is in chronic afib. She is programmed VVIR, pacing 98.8% of the time, thresholds good. 4 high V rate episodes since June (June, Aug, Sept, Nov 16th). Daveigh Batty PA-C

## 2013-10-14 NOTE — Progress Notes (Signed)
SUBJECTIVE:  Feeling better this am  OBJECTIVE:   Vitals:   Filed Vitals:   10/13/13 2300 10/14/13 0349 10/14/13 0700 10/14/13 0830  BP: 112/53 113/36  115/49  Pulse: 73 69  69  Temp: 98 F (36.7 C) 97.8 F (36.6 C)    TempSrc: Oral Oral    Resp: 23 18  20   Height:      Weight:   238 lb 8.6 oz (108.2 kg)   SpO2: 96% 95%  98%   I&O's:   Intake/Output Summary (Last 24 hours) at 10/14/13 [id number] Last data filed at 10/14/13 0800  Gross per 24 hour  Intake 602.63 ml  Output   1450 ml  Net -847.37 ml   TELEMETRY: Reviewed telemetry pt in paced:     PHYSICAL EXAM General: Well developed, well nourished, in no acute distress Head: Eyes PERRLA, No xanthomas.   Normal cephalic and atramatic  Lungs:   Clear bilaterally to auscultation and percussion. Heart:   HRRR S1 S2 Pulses are 2+ & equal. Abdomen: Bowel sounds are positive, abdomen soft and non-tender without masses  Extremities:   3+ edema bilaterally Neuro: Alert and oriented X 3. Psych:  Good affect, responds appropriately   LABS: Basic Metabolic Panel:  Recent Labs  10/11/13 1149 10/12/13 0420 10/13/13 0720  NA 139 138 134*  K 3.8 3.8 3.5  CL 104 103 99  CO2 22 21 20   GLUCOSE 133* 83 103*  BUN 35* 44* 55*  CREATININE 1.90* 2.12* 2.47*  CALCIUM 8.0* 8.1* 8.0*  MG 2.1  --   --    Liver Function Tests: No results found for this basename: AST, ALT, ALKPHOS, BILITOT, PROT, ALBUMIN,  in the last 72 hours No results found for this basename: LIPASE, AMYLASE,  in the last 72 hours CBC:  Recent Labs  10/12/13 0420 10/13/13 0720  WBC 11.7* 10.6*  HGB 9.6* 9.5*  HCT 29.8* 28.3*  MCV 85.4 84.2  PLT 135* 140*   Cardiac Enzymes: No results found for this basename: CKTOTAL, CKMB, CKMBINDEX, TROPONINI,  in the last 72 hours BNP: No components found with this basename: POCBNP,  D-Dimer: No results found for this basename: DDIMER,  in the last 72 hours Hemoglobin A1C: No results found for this basename:  HGBA1C,  in the last 72 hours Fasting Lipid Panel: No results found for this basename: CHOL, HDL, LDLCALC, TRIG, CHOLHDL, LDLDIRECT,  in the last 72 hours Thyroid Function Tests: No results found for this basename: TSH, T4TOTAL, FREET3, T3FREE, THYROIDAB,  in the last 72 hours Anemia Panel: No results found for this basename: VITAMINB12, FOLATE, FERRITIN, TIBC, IRON, RETICCTPCT,  in the last 72 hours Coag Panel:   Lab Results  Component Value Date   INR 1.32 10/14/2013   INR 1.60* 10/13/2013   INR 2.00* 10/12/2013    RADIOLOGY: Ct Abdomen Pelvis Wo Contrast  10/10/2013   CLINICAL DATA:  Vomiting and diarrhea for 3-4 days, generalized weakness, on Coumadin, history is CHF  EXAM: CT ABDOMEN AND PELVIS WITHOUT CONTRAST  TECHNIQUE: Multidetector CT imaging of the abdomen and pelvis was performed following the standard protocol without intravenous contrast.  COMPARISON:  Lumbar spine CT -06/15/2009  FINDINGS: The lack of intravenous contrast limits the ability to evaluate solid abdominal organs.  There is mild nodularity of the hepatic contour, nonspecific though could be seen in the setting of cirrhosis. This finding is associated with small volume of intra-abdominal ascites. Post cholecystectomy. There is apparent fusiform ectasia of the origin  of the portal vein regional to the confluence of the superior mesenteric and splenic veins measuring approximately 2.8 cm in greatest oblique axial dimension (image 31, series 2).  The bilateral kidneys are slightly atrophic compatible with provided history of renal insufficiency. No discrete renal stones. No urinary obstruction or perinephric stranding. Normal noncontrast appearance of the bilateral adrenal glands. There is a crescentic approximately 1.6 x 1.4 cm presumed partially calcified splenic artery aneurysm (image 26, series 2). Additional smaller presumed splenic aneurysms are seen about the splenic hilum.  Scattered colonic diverticulosis without  evidence of diverticulitis. The colon is largely decompressed. The bowel is otherwise normal in course and caliber without discrete area of wall thickening or evidence of obstruction. The appendix is not definitely identified, however there is no definitive inflammatory change within the right lower abdominal quadrant. No pneumoperitoneum, pneumatosis or portal venous gas.  Atherosclerotic plaque within a normal caliber abdominal aorta. Scattered shotty retroperitoneal lymph nodes are individually not enlarged by size criteria. No retroperitoneal, mesenteric, pelvic or inguinal lymphadenopathy on this noncontrast examination.  The pelvic organs her expectantly atrophic. No discrete adnexal lesion.  Limited visualization of the lower thorax demonstrates minimal subsegmental atelectasis within the caudal aspect of the right middle lobe in an left lower lobe. No focal airspace opacities. No pleural effusion.  Cardiomegaly. Ventricular pacer leads are seen within the right atrium, ventricle and coronary sinus. No pericardial effusion.  No acute or aggressive osseous abnormalities. There is mild straightening expected lumbar lordosis. Grossly unchanged mild (approximately 25%) compression deformity involving the anterior aspect of the superior endplate of the L4 vertebral body. Grossly unchanged mild (approximately 5 mm) of retrolisthesis of L4 upon L5.  IMPRESSION: 1. Colonic diverticulosis without definite evidence of diverticulitis on this noncontrast examination. The colon is decompressed without definite evidence of obstruction. 2. Mild nodularity of the hepatic contour suggestive of cirrhosis. This finding is associated with small volume intra-abdominal ascites. 3. Incidental note made of an approximately 1.6 cm splenic artery aneurysm - this is of doubtful clinical concern in this postmenopausal patient. 4. Suspected fusiform ectasia of the origin of the portal vein, measuring approximately 1.8 cm, incompletely  evaluated on this noncontrast examination and of uncertain clinical significant. 5. Cardiomegaly.   Electronically Signed   By: Sandi Mariscal M.D.   On: 10/10/2013 15:48   Dg Chest 2 View  10/10/2013   CLINICAL DATA:  Cough.  EXAM: CHEST  2 VIEW  COMPARISON:  November 29, 2007.  FINDINGS: Stable cardiomegaly. Status post mitral valve replacement. Left-sided pacemaker is unchanged in position. No pneumothorax or pleural effusion is noted. No acute pulmonary disease is noted. Bony thorax is intact.  IMPRESSION: No acute cardiopulmonary abnormality seen.   Electronically Signed   By: Sabino Dick M.D.   On: 10/10/2013 17:04   ASSESSMENT/PLAN:  79 year old female with prior mitral valve repair, previously described ejection fraction of 45% with cirrhosis of the liver, thrombocytopenia, abdominal discomfort, with new echocardiogram demonstrating possible ejection fraction of 35-40%.  1. Acute on chronic systolic/diastolic heart failure- Ejection fraction of 35-40% was a gross estimation however there did appear to be dysfunction present. Her mitral valve repair is intact on echo. Her creatinine bumped with IV Lasix and Lasix was held. BP much more stable today after starting IV dopa/dobutamine. Urine output has picked up.  Will restart diuretic today as BP more stable.  Would stop dig due to renal insuff. Currently off beta blocker due to hypotension and no ACE I due to renal  failure.  Increase Dobutamine to 5 mcg and Will get an Advanced HF consult. 2. Cirrhosis with ascites. Her abdomen is distended and she has had diarrhea. Cirrhosis most likely due to CHF but need to consider GI etiology. Appreciatte GI input  3. Perirectal abcess requiring drainage later today 4. Severe MR S/P MV repair with Carpentier ring annuloplasty  5. PAF on chronic anticoagulation - coumadin reversed for surgery today  6. CKD stage III         Sueanne Margarita, MD  10/14/2013  9:18 AM

## 2013-10-15 ENCOUNTER — Inpatient Hospital Stay (HOSPITAL_COMMUNITY): Payer: Medicare Other

## 2013-10-15 DIAGNOSIS — Z950 Presence of cardiac pacemaker: Secondary | ICD-10-CM

## 2013-10-15 LAB — URINE CULTURE
Colony Count: NO GROWTH
Culture: NO GROWTH

## 2013-10-15 LAB — CBC
HCT: 24.3 % — ABNORMAL LOW (ref 36.0–46.0)
MCH: 27.5 pg (ref 26.0–34.0)
MCV: 85.6 fL (ref 78.0–100.0)
Platelets: 193 10*3/uL (ref 150–400)
RBC: 2.84 MIL/uL — ABNORMAL LOW (ref 3.87–5.11)

## 2013-10-15 LAB — BASIC METABOLIC PANEL
BUN: 46 mg/dL — ABNORMAL HIGH (ref 6–23)
CO2: 20 mEq/L (ref 19–32)
Calcium: 7.7 mg/dL — ABNORMAL LOW (ref 8.4–10.5)
Chloride: 104 mEq/L (ref 96–112)
Creatinine, Ser: 1.61 mg/dL — ABNORMAL HIGH (ref 0.50–1.10)
Glucose, Bld: 88 mg/dL (ref 70–99)
Sodium: 138 mEq/L (ref 135–145)

## 2013-10-15 MED ORDER — SODIUM CHLORIDE 0.9 % IJ SOLN
10.0000 mL | Freq: Two times a day (BID) | INTRAMUSCULAR | Status: DC
  Administered 2013-10-16 – 2013-10-18 (×4): 10 mL

## 2013-10-15 MED ORDER — MORPHINE SULFATE 2 MG/ML IJ SOLN
2.0000 mg | Freq: Once | INTRAMUSCULAR | Status: AC
  Administered 2013-10-15: 2 mg via INTRAVENOUS

## 2013-10-15 MED ORDER — HYDROCODONE-ACETAMINOPHEN 5-325 MG PO TABS
1.0000 | ORAL_TABLET | Freq: Four times a day (QID) | ORAL | Status: DC | PRN
  Administered 2013-10-15 – 2013-10-19 (×8): 1 via ORAL
  Filled 2013-10-15 (×9): qty 1

## 2013-10-15 MED ORDER — VANCOMYCIN HCL 10 G IV SOLR
1500.0000 mg | INTRAVENOUS | Status: DC
  Administered 2013-10-15: 1500 mg via INTRAVENOUS
  Filled 2013-10-15 (×2): qty 1500

## 2013-10-15 MED ORDER — PIPERACILLIN-TAZOBACTAM 3.375 G IVPB
3.3750 g | Freq: Three times a day (TID) | INTRAVENOUS | Status: DC
  Administered 2013-10-15 – 2013-10-20 (×14): 3.375 g via INTRAVENOUS
  Filled 2013-10-15 (×18): qty 50

## 2013-10-15 MED ORDER — SODIUM CHLORIDE 0.9 % IJ SOLN
10.0000 mL | INTRAMUSCULAR | Status: DC | PRN
  Administered 2013-10-15 – 2013-10-16 (×2): 10 mL

## 2013-10-15 MED ORDER — MORPHINE SULFATE 2 MG/ML IJ SOLN
INTRAMUSCULAR | Status: AC
  Filled 2013-10-15: qty 1

## 2013-10-15 MED ORDER — COLCHICINE 0.6 MG PO TABS
0.6000 mg | ORAL_TABLET | Freq: Every day | ORAL | Status: DC | PRN
  Administered 2013-10-16: 0.6 mg via ORAL
  Filled 2013-10-15 (×3): qty 1

## 2013-10-15 NOTE — Progress Notes (Signed)
[name] 10:11 AM  Subjective: Patient without any obvious postop problem and has been urinating nicely and eating fine and no new complaints and her case was discussed with my partner Dr. Oletta Lamas  Objective: Vital signs stable afebrile no acute distress patient not examined today looks well  Assessment: Questionable cirrhosis versus ascites from heart failure  Plan: No further GI workup at this time please call us back if we can be of any further assistance and Dr. Oletta Lamas happy to see back when necessary  [facility]

## 2013-10-15 NOTE — Progress Notes (Signed)
Peripherally Inserted Central Catheter/Midline Placement  The IV Nurse has discussed with the patient and/or persons authorized to consent for the patient, the purpose of this procedure and the potential benefits and risks involved with this procedure.  The benefits include less needle sticks, lab draws from the catheter and patient may be discharged home with the catheter.  Risks include, but not limited to, infection, bleeding, blood clot (thrombus formation), and puncture of an artery; nerve damage and irregular heat beat.  Alternatives to this procedure were also discussed.  PICC/Midline Placement Documentation  PICC Triple Lumen 10/15/13 PICC Right Cephalic 40 cm 0 cm (Active)  Indication for Insertion or Continuance of Line Limited venous access - need for IV therapy >5 days (PICC only) 10/15/2013 12:00 PM  Exposed Catheter (cm) 0 cm 10/15/2013 12:00 PM  Site Assessment Clean;Dry;Intact 10/15/2013 12:00 PM  Lumen #1 Status Flushed;Blood return noted 10/15/2013 12:00 PM  Lumen #2 Status Flushed;Blood return noted 10/15/2013 12:00 PM  Lumen #3 Status Flushed;Blood return noted 10/15/2013 12:00 PM  Dressing Type Transparent 10/15/2013 12:00 PM  Dressing Status Clean;Dry;Intact 10/15/2013 12:00 PM  Dressing Intervention New dressing 10/15/2013 12:00 PM  Dressing Change Due 10/22/13 10/15/2013 12:00 PM       [facility] Payment 10/15/2013, 12:06 PM

## 2013-10-15 NOTE — Progress Notes (Signed)
ANTIBIOTIC CONSULT NOTE - Follow up  Pharmacy Consult for vancomycin/zosyn Indication: perirectal Abscess   No Known Allergies  Patient Measurements: Height: 5\' 3"  (160 cm) Weight: 233 lb 7.5 oz (105.9 kg) IBW/kg (Calculated) : 52.4  Vital Signs: Temp: 97.7 F (36.5 C) (11/29 1206) Temp src: Oral (11/29 1206) BP: 137/38 mmHg (11/29 1206) Pulse Rate: 70 (11/29 1206) Intake/Output from previous day: 11/28 0701 - 11/29 0700 In: 693.9 [I.V.:693.9] Out: [id number] [Urine:3400; Blood:275] Intake/Output from this shift: Total I/O In: 272 [P.O.:240; I.V.:32] Out: 1550 [id number]  Labs:  Recent Labs  10/13/13 0720 10/14/13 0905 10/14/13 1615 10/15/13 0604  WBC 10.6* 9.9 10.2 9.7  HGB 9.5* 9.6* 8.7* 7.8*  PLT 140* 171 192 193  CREATININE 2.47* 1.88*  --  1.61*   Estimated Creatinine Clearance: 29.8 ml/min (by C-G formula based on Cr of 1.61). No results found for this basename: VANCOTROUGH, VANCOPEAK, VANCORANDOM, GENTTROUGH, GENTPEAK, GENTRANDOM, TOBRATROUGH, TOBRAPEAK, TOBRARND, AMIKACINPEAK, AMIKACINTROU, AMIKACIN,  in the last 72 hours     Medical History: Past Medical History  Diagnosis Date  . CHF (congestive heart failure)     a. Chronic combined systolic/diastolic CHF EF [id number] in 09/2006.  [name] Hypertension   . Mitral valve disorder     a. Severe MR s/p repair 2003 (28 mm annuloplasty ring and and oversew of LAA). No CAD by cath at that time.  . Atrial fibrillation     a. Previously failed DCCV on amiodarone. b. Previously on Tikosyn -started 2008.  [name] Pacemaker     a. 2003: post-op afib after MR repair then symptomatic bradycardia requiring pacemaker. b. Upgrade to Medtronic Bi-V Pacemaker 2007.  [name] Cirrhosis     a. Newly recognized 09/2013 - by CT.  . CKD (chronic kidney disease)   . NSVT (nonsustained ventricular tachycardia)     a. Isolated event during 09/2007 adm.    Medications:  Prescriptions prior to admission  Medication Sig Dispense Refill  .  acetaminophen (TYLENOL) 500 MG tablet Take 500 mg by mouth every 6 (six) hours as needed for mild pain.      . beta carotene w/minerals (OCUVITE) tablet Take 1 tablet by mouth daily.      . carvedilol (COREG) 12.5 MG tablet Take 12.5 mg by mouth 2 (two) times daily with a meal.      . cetirizine (ZYRTEC) 10 MG tablet Take 10 mg by mouth daily.      . colchicine 0.6 MG tablet Take 0.6 mg by mouth daily.      . digoxin (LANOXIN) 0.125 MG tablet Take 0.0625 mg by mouth daily.      . ferrous sulfate 325 (65 FE) MG tablet Take 325 mg by mouth daily with breakfast.      . furosemide (LASIX) 20 MG tablet Take 20-40 mg by mouth daily as needed (for swelling).      . Oxymetazoline HCl (NOSTRILLA CONGESTION RELIEF NA) Place 1 spray into the nose daily as needed (for congestion).      . potassium chloride SA (K-DUR,KLOR-CON) 20 MEQ tablet Take 20 mEq by mouth daily.      [name] warfarin (COUMADIN) 5 MG tablet Take 2.5-5 mg by mouth daily. Take 2.5mg  daily, except take 5mg  on Mondays.       Scheduled:  . beta carotene w/minerals  1 tablet Oral Daily  . ferrous sulfate  325 mg Oral Q breakfast  . furosemide  40 mg Intravenous Q8H  . loratadine  10 mg Oral Daily  .  morphine      . piperacillin-tazobactam (ZOSYN)  IV  3.375 g Intravenous Q8H  . sodium chloride  10-40 mL Intracatheter Q12H  . sodium chloride  3 mL Intravenous Q12H  . vancomycin  1,500 mg Intravenous Q48H  . vancomycin  1,500 mg Intravenous Q48H     Assessment: 79 yo female with UTI and left labia, perirectal abscess continues on IV vancomycin/zosyn Day #4.  POD#1 s/p I&D of perineal abscess.  WBC= 9.7, SCr improved to 1.61 and CrCl ~ 29.8 ml/min.   Perineal abscess culture no growth to date. Urine culture 11/28 negative. Vancomycin dose was not given on 11/28 due to in OR.  Current dose of 1500mg  IV q48 hr is still appropriate. Will give vancomycin dose today and adjust zosyn dose for CrCl >20 ml/min.  Patient noted on coumadin PTA for afib  and INR is 1.6 and coumadin is remains on hold. POD#1 s/p I&D:  ABLA. MD notes coumadin to restart when okay with surgery.    11/24 urine- multiple types-final 11/28 urine>>negative 11/28 perineal abscess culture>> NGTD  CTX 11/24>> 11/26 11/26 vanc 11/26 zosyn   Goal of Therapy:  Vancomycin trough level 15-20 mcg/ml  Plan:  -Change Zosyn to 3.375 gm IV q8h (EI) -Vancomycin 1500mg  IV q48hr- start today ASAP -Will follow renal function, cultures and clinical progress   Nicole Cella, RPh Clinical Pharmacist Pager: [phone]  10/15/2013 4:01 PM

## 2013-10-15 NOTE — Progress Notes (Addendum)
SUBJECTIVE:  Feeling better  OBJECTIVE:   Vitals:   Filed Vitals:   10/14/13 2118 10/14/13 2300 10/15/13 0434 10/15/13 0500  BP: 121/46 93/42 118/44 122/32  Pulse: 70 72 72 70  Temp: 97.9 F (36.6 C) 98.4 F (36.9 C) 98.5 F (36.9 C)   TempSrc: Oral Oral Oral   Resp: 19 23 21 18   Height:      Weight:    233 lb 7.5 oz (105.9 kg)  SpO2: 97% 98% 99% 97%   I&O's:   Intake/Output Summary (Last 24 hours) at 10/15/13 0846 Last data filed at 10/15/13 0600  Gross per 24 hour  Intake 676.93 ml  Output   3525 ml  Net -2848.07 ml   TELEMETRY: Reviewed telemetry pt in Vpaced:     PHYSICAL EXAM General: Well developed, well nourished, in no acute distress Head: Eyes PERRLA, No xanthomas.   Normal cephalic and atramatic  Lungs:   Clear bilaterally to auscultation and percussion. Heart:   HRRR S1 S2 Pulses are 2+ & equal. Abdomen: Bowel sounds are positive, abdomen soft and non-tender without masses  Extremities:   2+ edema Neuro: Alert and oriented X 3. Psych:  Good affect, responds appropriately   LABS: Basic Metabolic Panel:  Recent Labs  10/14/13 0905 10/15/13 0604  NA 136 138  K 3.4* 3.6  CL 101 104  CO2 20 20  GLUCOSE 96 88  BUN 51* 46*  CREATININE 1.88* 1.61*  CALCIUM 8.2* 7.7*   Liver Function Tests: No results found for this basename: AST, ALT, ALKPHOS, BILITOT, PROT, ALBUMIN,  in the last 72 hours No results found for this basename: LIPASE, AMYLASE,  in the last 72 hours CBC:  Recent Labs  10/14/13 1615 10/15/13 0604  WBC 10.2 9.7  HGB 8.7* 7.8*  HCT 26.1* 24.3*  MCV 84.2 85.6  PLT 192 193   Cardiac Enzymes: No results found for this basename: CKTOTAL, CKMB, CKMBINDEX, TROPONINI,  in the last 72 hours BNP: No components found with this basename: POCBNP,  D-Dimer: No results found for this basename: DDIMER,  in the last 72 hours Hemoglobin A1C: No results found for this basename: HGBA1C,  in the last 72 hours Fasting Lipid Panel: No results  found for this basename: CHOL, HDL, LDLCALC, TRIG, CHOLHDL, LDLDIRECT,  in the last 72 hours Thyroid Function Tests: No results found for this basename: TSH, T4TOTAL, FREET3, T3FREE, THYROIDAB,  in the last 72 hours Anemia Panel: No results found for this basename: VITAMINB12, FOLATE, FERRITIN, TIBC, IRON, RETICCTPCT,  in the last 72 hours Coag Panel:   Lab Results  Component Value Date   INR 1.29 10/15/2013   INR 1.32 10/14/2013   INR 1.60* 10/13/2013    RADIOLOGY: Ct Abdomen Pelvis Wo Contrast  10/10/2013   CLINICAL DATA:  Vomiting and diarrhea for 3-4 days, generalized weakness, on Coumadin, history is CHF  EXAM: CT ABDOMEN AND PELVIS WITHOUT CONTRAST  TECHNIQUE: Multidetector CT imaging of the abdomen and pelvis was performed following the standard protocol without intravenous contrast.  COMPARISON:  Lumbar spine CT -06/15/2009  FINDINGS: The lack of intravenous contrast limits the ability to evaluate solid abdominal organs.  There is mild nodularity of the hepatic contour, nonspecific though could be seen in the setting of cirrhosis. This finding is associated with small volume of intra-abdominal ascites. Post cholecystectomy. There is apparent fusiform ectasia of the origin of the portal vein regional to the confluence of the superior mesenteric and splenic veins measuring approximately 2.8 cm  in greatest oblique axial dimension (image 31, series 2).  The bilateral kidneys are slightly atrophic compatible with provided history of renal insufficiency. No discrete renal stones. No urinary obstruction or perinephric stranding. Normal noncontrast appearance of the bilateral adrenal glands. There is a crescentic approximately 1.6 x 1.4 cm presumed partially calcified splenic artery aneurysm (image 26, series 2). Additional smaller presumed splenic aneurysms are seen about the splenic hilum.  Scattered colonic diverticulosis without evidence of diverticulitis. The colon is largely decompressed. The  bowel is otherwise normal in course and caliber without discrete area of wall thickening or evidence of obstruction. The appendix is not definitely identified, however there is no definitive inflammatory change within the right lower abdominal quadrant. No pneumoperitoneum, pneumatosis or portal venous gas.  Atherosclerotic plaque within a normal caliber abdominal aorta. Scattered shotty retroperitoneal lymph nodes are individually not enlarged by size criteria. No retroperitoneal, mesenteric, pelvic or inguinal lymphadenopathy on this noncontrast examination.  The pelvic organs her expectantly atrophic. No discrete adnexal lesion.  Limited visualization of the lower thorax demonstrates minimal subsegmental atelectasis within the caudal aspect of the right middle lobe in an left lower lobe. No focal airspace opacities. No pleural effusion.  Cardiomegaly. Ventricular pacer leads are seen within the right atrium, ventricle and coronary sinus. No pericardial effusion.  No acute or aggressive osseous abnormalities. There is mild straightening expected lumbar lordosis. Grossly unchanged mild (approximately 25%) compression deformity involving the anterior aspect of the superior endplate of the L4 vertebral body. Grossly unchanged mild (approximately 5 mm) of retrolisthesis of L4 upon L5.  IMPRESSION: 1. Colonic diverticulosis without definite evidence of diverticulitis on this noncontrast examination. The colon is decompressed without definite evidence of obstruction. 2. Mild nodularity of the hepatic contour suggestive of cirrhosis. This finding is associated with small volume intra-abdominal ascites. 3. Incidental note made of an approximately 1.6 cm splenic artery aneurysm - this is of doubtful clinical concern in this postmenopausal patient. 4. Suspected fusiform ectasia of the origin of the portal vein, measuring approximately 1.8 cm, incompletely evaluated on this noncontrast examination and of uncertain clinical  significant. 5. Cardiomegaly.   Electronically Signed   By: Sandi Mariscal M.D.   On: 10/10/2013 15:48   Dg Chest 2 View  10/10/2013   CLINICAL DATA:  Cough.  EXAM: CHEST  2 VIEW  COMPARISON:  November 29, 2007.  FINDINGS: Stable cardiomegaly. Status post mitral valve replacement. Left-sided pacemaker is unchanged in position. No pneumothorax or pleural effusion is noted. No acute pulmonary disease is noted. Bony thorax is intact.  IMPRESSION: No acute cardiopulmonary abnormality seen.   Electronically Signed   By: Sabino Dick M.D.   On: 10/10/2013 17:04    ASSESSMENT/PLAN:  79 year old female with prior mitral valve repair, previously described ejection fraction of 45% with cirrhosis of the liver, thrombocytopenia, abdominal discomfort, with new echocardiogram demonstrating possible ejection fraction of 35-40%.  1. Acute on chronic systolic/diastolic heart failure- Ejection fraction of 35-40% was a gross estimation however there did appear to be dysfunction present. Her mitral valve repair is intact on echo. Her creatinine bumped with IV Lasix and Lasix was held. BP much more stable today after starting IV dopa/dobutamine and dopamine stopped yesterday while dobutamine increased.  Urine output has significantly picked up and she put out over 3L yesterday and weight is down 5lb since admit.  Dig stopped due to renal insuff.  No ACE I due to renal failure. Appreciated HF service input.  Will continue dobutamine at current dose  and IV diuretics.  BP still borderline so will hold off on beta blocker just yet. 2. Cirrhosis with ascites. Her abdomen is distended and she has had diarrhea. Cirrhosis most likely due to CHF but need to consider GI etiology. Appreciatte GI input  3. Perirectal abcess s/p drainage 4. Severe MR S/P MV repair with Carpentier ring annuloplasty  5. PAF on chronic anticoagulation - coumadin reversed for surgery - restart when ok with surgery 6. CKD stage III  7. Place sequential  compression devices for DVT prophylaxis    Sueanne Margarita, MD  10/15/2013  8:46 AM

## 2013-10-15 NOTE — Progress Notes (Signed)
1 Day Post-Op  Subjective: C/o pain at surgery site.  Ambulated to the bathroom.  Tolerating dressing change well.  Tolerating HH diet.    Objective: Vital signs in last 24 hours: Temp:  [97.6 F (36.4 C)-98.5 F (36.9 C)] 97.7 F (36.5 C) (11/29 1206) Pulse Rate:  [64-72] 70 (11/29 1206) Resp:  [15-24] 24 (11/29 1206) BP: (83-137)/(32-52) 137/38 mmHg (11/29 1206) SpO2:  [97 %-100 %] 97 % (11/29 1206) Weight:  [233 lb 7.5 oz (105.9 kg)] 233 lb 7.5 oz (105.9 kg) (11/29 0500) Last BM Date: 10/11/13  Intake/Output from previous day: 11/28 0701 - 11/29 0700 In: 693.9 [I.V.:693.9] Out: 3675 [Urine:3400; Blood:275] Intake/Output this shift: Total I/O In: 272 [P.O.:240; I.V.:32] Out: 1550 [Urine:1550]  PE: Gen:  Alert, NAD, pleasant Perineum:  Wound appears mostly clean, packing was saturated with bloody drainage, but no active bleeding noted in the wound bed, no significant slough/necrosis/infection; peri-wound erythema and induration, but no areas of fluctuance  Lab Results:   Recent Labs  10/14/13 1615 10/15/13 0604  WBC 10.2 9.7  HGB 8.7* 7.8*  HCT 26.1* 24.3*  PLT 192 193   BMET  Recent Labs  10/14/13 0905 10/15/13 0604  NA 136 138  K 3.4* 3.6  CL 101 104  CO2 20 20  GLUCOSE 96 88  BUN 51* 46*  CREATININE 1.88* 1.61*  CALCIUM 8.2* 7.7*   PT/INR  Recent Labs  10/14/13 0350 10/15/13 0604  LABPROT 16.1* 15.8*  INR 1.32 1.29   CMP     Component Value Date/Time   NA 138 10/15/2013 0604   K 3.6 10/15/2013 0604   CL 104 10/15/2013 0604   CO2 20 10/15/2013 0604   GLUCOSE 88 10/15/2013 0604   BUN 46* 10/15/2013 0604   CREATININE 1.61* 10/15/2013 0604   CALCIUM 7.7* 10/15/2013 0604   PROT 6.1 10/10/2013 1204   ALBUMIN 3.1* 10/10/2013 1204   AST 12 10/10/2013 1204   ALT 7 10/10/2013 1204   ALKPHOS 108 10/10/2013 1204   BILITOT 2.1* 10/10/2013 1204   GFRNONAA 28* 10/15/2013 0604   GFRAA 33* 10/15/2013 0604   Lipase  No results found for this  basename: lipase       Studies/Results: Dg Chest Port 1 View  10/15/2013   CLINICAL DATA:  Line placement.  EXAM: PORTABLE CHEST - 1 VIEW  COMPARISON:  10/10/2013.  FINDINGS: PICC line noted with tip projected over superior vena cava. Cardiac pacer noted with lead tips in stable position. Prior median sternotomy. Mitral valve replacement. Cardiomegaly. No CHF. Pulmonary vascularity is normal. Tiny left pleural effusion cannot be completely excluded. Right apical interstitial prominence most consistent with interstitial fibrosis. A component of underlying bronchiectasis may be present.  IMPRESSION: 1. PICC line with tip in good anatomic position. 2. Mitral valve replacement. Median sternotomy. Cardiac pacer. Cardiomegaly. No pulmonary venous congestion. 3. Small left pleural effusion cannot be excluded . 4. Right apical interstitial scarring. A component of bronchiectasis may be present.   Electronically Signed   By: Marcello Moores  Register   On: 10/15/2013 12:59    Anti-infectives: Anti-infectives   Start     Dose/Rate Route Frequency Ordered Stop   10/14/13 1300  vancomycin (VANCOCIN) 1,500 mg in sodium chloride 0.9 % 500 mL IVPB     1,500 mg 250 mL/hr over 120 Minutes Intravenous Every 48 hours 10/12/13 1131     10/12/13 1300  vancomycin (VANCOCIN) 2,000 mg in sodium chloride 0.9 % 500 mL IVPB     2,000  mg 250 mL/hr over 120 Minutes Intravenous  Once 10/12/13 1131 10/12/13 1500   10/12/13 1230  piperacillin-tazobactam (ZOSYN) IVPB 2.25 g     2.25 g 100 mL/hr over 30 Minutes Intravenous Every 6 hours 10/12/13 1131     10/11/13 0600  cefTRIAXone (ROCEPHIN) 1 g in dextrose 5 % 50 mL IVPB  Status:  Discontinued     1 g 100 mL/hr over 30 Minutes Intravenous Every 24 hours 10/10/13 1828 10/12/13 1021   10/10/13 1530  cefTRIAXone (ROCEPHIN) 1 g in dextrose 5 % 50 mL IVPB     1 g 100 mL/hr over 30 Minutes Intravenous  Once 10/10/13 1529 10/10/13 1635       Assessment/Plan 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  Continue BID packing changes, tolerated bedside dressing change well 2.  Cont vanc/zosyn, WBC normal 3.  Monitor H/H given blood loss during surgery 4.  No need for additional debridements at this time     LOS: 5 days    Coralie Keens 10/15/2013, 3:32 PM Pager: [phone]

## 2013-10-15 NOTE — Progress Notes (Signed)
[facility] PROGRESS NOTE  [name] [id number] DOB: 1926/06/10 DOA: 10/10/2013 PCP: No primary provider on file.  Assessment/Plan:  Left labia, perirectal Abscess: Continue with  Vancomycin and Zosyn day 4. Surgery following. INR 1.6.  WBC trending down. S/P I and D 11-28.   Liver cirrhosis on imaging - patient without known history of any liver disease. She has ascites, elevated total bilirubin, thrombocytopenia.  viral hepatitis panel negative. GI consulted. Dr Percell Miller think cirrhosis could be due to NASH. No further work up at this point.   Urinary tract infection - patient started on empiric ceftriaxone on admission. UA with 21 to 50 WBC. Change ceftriaxone to Zosyn due to # 1. Urine culture grew multiple bacterial morphotype.   Acute on chronic renal Failure: Last cr per records at 1.3. Cr on admission at 1.4.  -Cr increase to 2. 4. Renal function improved on dopamine. Stable on IV lasix.   Acute on chronic systolic heart failure - 2D echo  shows further decrease in her EF to 35-40% and anteroseptal hypokinesis. Cardiology consulted, appreciate input.  -Continue with IV lasix, and Dobutamine.  -negative 3 L.   Nausea/vomiting - due to probably acute illness. Resolved.   Diarrhea; Resolved. C diff negative. No BM today. She usually has 3 BM per day.   Acute blood loss anemia on Chronic anemia: post surgery. Monitor. If continue to decrease might need blood transfusion.   S/p mitral valve annuloplasty   Atrial fibrillation -Continue to hold Coumadin. INR at 1.6. Resume coumadin when ok by surgery.   Status post pacemaker   Chronic anticoagulation -will defer to cardio continuation of anticoagulation.  Thrombocytopenia - due to infection process.  Improving.    Code Status: DNR Family Communication: Care discussed with patient.  Disposition Plan: Remain step Down.     Consultants:  Surgery.  Cardiology  GI  Procedures:  none  Antibiotics:  Ceftriaxone 11-25  Zosyn 11-26  Vancomycin 11-26  HPI/Subjective: She is feeling better today. No worsening dyspnea.    Objective: Filed Vitals:   10/15/13 1206  BP: 137/38  Pulse: 70  Temp: 97.7 F (36.5 C)  Resp: 24    Intake/Output Summary (Last 24 hours) at 10/15/13 1410 Last data filed at 10/15/13 1206  Gross per 24 hour  Intake    376 ml  Output   4050 ml  Net  -3674 ml   Filed Weights   10/12/13 0449 10/14/13 0700 10/15/13 0500  Weight: 106 kg (233 lb 11 oz) 108.2 kg (238 lb 8.6 oz) 105.9 kg (233 lb 7.5 oz)    Exam:   General:  No distress  Cardiovascular: S 1, S 2 RRR  Respiratory: decrease breath sounds, crackles bases.   Abdomen: Bs present, soft, NT, distended.   Musculoskeletal: trace  edema.    Data Reviewed: Basic Metabolic Panel:  Recent Labs Lab 10/11/13 1149 10/12/13 0420 10/13/13 0720 10/14/13 0905 10/15/13 0604  NA 139 138 134* 136 138  K 3.8 3.8 3.5 3.4* 3.6  CL 104 103 99 101 104  CO2 22 21 20 20 20   GLUCOSE 133* 83 103* 96 88  BUN 35* 44* 55* 51* 46*  CREATININE 1.90* 2.12* 2.47* 1.88* 1.61*  CALCIUM 8.0* 8.1* 8.0* 8.2* 7.7*  MG 2.1  --   --   --   --    Liver Function Tests:  Recent Labs Lab 10/10/13 1204  AST 12  ALT 7  ALKPHOS 108  BILITOT 2.1*  PROT 6.1  ALBUMIN 3.1*   No results found for this basename: LIPASE, AMYLASE,  in the last 168 hours No results found for this basename: AMMONIA,  in the last 168 hours CBC:  Recent Labs Lab 10/12/13 0420 10/13/13 0720 10/14/13 0905 10/14/13 1615 10/15/13 0604  WBC 11.7* 10.6* 9.9 10.2 9.7  HGB 9.6* 9.5* 9.6* 8.7* 7.8*  HCT 29.8* 28.3* 28.8* 26.1* 24.3*  MCV 85.4 84.2 84.2 84.2 85.6  PLT 135* 140* 171 192 193   Cardiac Enzymes:  Recent Labs Lab 10/10/13 1843 10/10/13 2255 10/11/13 0510  TROPONINI <0.30 <0.30 <0.30   BNP (last 3 results)  Recent Labs   10/10/13 1634 10/13/13 1113  PROBNP 6217.0* 8553.0*   CBG: No results found for this basename: GLUCAP,  in the last 168 hours  Recent Results (from the past 240 hour(s))  URINE CULTURE     Status: None   Collection Time    10/10/13  2:32 PM      Result Value Range Status   Specimen Description URINE, RANDOM   Final   Special Requests NONE   Final   Culture  Setup Time     Final   Value: 10/10/2013 20:40     Performed at [facility]     Final   Value: >=100,000 COLONIES/ML     Performed at [facility]   Culture     Final   Value: Multiple bacterial morphotypes present, none predominant. Suggest appropriate recollection if clinically indicated.     Performed at [facility]   Report Status 10/12/2013 FINAL   Final  MRSA PCR SCREENING     Status: None   Collection Time    10/10/13  6:24 PM      Result Value Range Status   MRSA by PCR NEGATIVE  NEGATIVE Final   Comment:            The GeneXpert MRSA Assay (FDA     approved for NASAL specimens     only), is one component of a     comprehensive MRSA colonization     surveillance program. It is not     intended to diagnose MRSA     infection nor to guide or     monitor treatment for     MRSA infections.  CLOSTRIDIUM DIFFICILE BY PCR     Status: None   Collection Time    10/11/13  8:38 PM      Result Value Range Status   C difficile by pcr NEGATIVE  NEGATIVE Final  URINE CULTURE     Status: None   Collection Time    10/14/13  8:47 AM      Result Value Range Status   Specimen Description URINE, CLEAN CATCH   Final   Special Requests A   Final   Culture  Setup Time     Final   Value: 10/14/2013 10:02     Performed at [facility]     Final   Value: NO GROWTH     Performed at [facility]   Culture     Final   Value: NO GROWTH     Performed at [facility]   Report Status 10/15/2013 FINAL   Final  ANAEROBIC CULTURE     Status: None    Collection Time    10/14/13 11:41 AM      Result Value Range Status   Specimen Description  ABSCESS   Final   Special Requests PERINEAL ABSCESS   Final   Gram Stain     Final   Value: NO WBC SEEN     NOPEPI NO ORGANISMS SEEN     Performed at [facility]   Culture     Final   Value: NO ANAEROBES ISOLATED; CULTURE IN PROGRESS FOR 5 DAYS     Performed at [facility]   Report Status PENDING   Incomplete  CULTURE, ROUTINE-ABSCESS     Status: None   Collection Time    10/14/13 11:41 AM      Result Value Range Status   Specimen Description ABSCESS   Final   Special Requests PERINEAL ABSCESS   Final   Gram Stain     Final   Value: NO WBC SEEN     NOPEI NO ORGANISMS SEEN     Performed at [facility]   Culture PENDING   Incomplete   Report Status PENDING   Incomplete     Studies: Dg Chest Port 1 View  10/15/2013   CLINICAL DATA:  Line placement.  EXAM: PORTABLE CHEST - 1 VIEW  COMPARISON:  10/10/2013.  FINDINGS: PICC line noted with tip projected over superior vena cava. Cardiac pacer noted with lead tips in stable position. Prior median sternotomy. Mitral valve replacement. Cardiomegaly. No CHF. Pulmonary vascularity is normal. Tiny left pleural effusion cannot be completely excluded. Right apical interstitial prominence most consistent with interstitial fibrosis. A component of underlying bronchiectasis may be present.  IMPRESSION: 1. PICC line with tip in good anatomic position. 2. Mitral valve replacement. Median sternotomy. Cardiac pacer. Cardiomegaly. No pulmonary venous congestion. 3. Small left pleural effusion cannot be excluded . 4. Right apical interstitial scarring. A component of bronchiectasis may be present.   Electronically Signed   By: Marcello Moores  Register   On: 10/15/2013 12:59    Scheduled Meds: . beta carotene w/minerals  1 tablet Oral Daily  . colchicine  0.6 mg Oral Daily  . ferrous sulfate  325 mg Oral Q breakfast  . furosemide  40 mg  Intravenous Q8H  . loratadine  10 mg Oral Daily  . piperacillin-tazobactam (ZOSYN)  IV  2.25 g Intravenous Q6H  . sodium chloride  3 mL Intravenous Q12H  . vancomycin  1,500 mg Intravenous Q48H   Continuous Infusions: . DOBUTamine 5 mcg/kg/min (10/15/13 0700)    Active Problems:   Chronic anticoagulation   Liver cirrhosis   Thrombocytopenia   Nausea and vomiting   Acute on chronic combined systolic and diastolic congestive heart failure   UTI (urinary tract infection)   CKD (chronic kidney disease) stage 3, GFR 30-59 ml/min   Anemia   PAF (paroxysmal atrial fibrillation)   Perirectal abscess   Biventricular cardiac pacemaker in situ    Time spent: 35 minutes.     Shan Padgett  [facility] Pager [phone]. If 7PM-7AM, please contact night-coverage at [email or web address], password [email or web address] 10/15/2013, 2:10 PM  LOS: 5 days

## 2013-10-15 NOTE — Progress Notes (Signed)
Seen and agree  

## 2013-10-15 NOTE — Evaluation (Signed)
Physical Therapy Evaluation Patient Details Name: [name] MRN: [id number] DOB: 17-Feb-1926 Today's Date: 10/15/2013 Time: 0802-0826 PT Time Calculation (min): 24 min  PT Assessment / Plan / Recommendation History of Present Illness  79 year old female with prior mitral valve repair, previously described ejection fraction of 45% with cirrhosis of the liver, thrombocytopenia, abdominal discomfort, with new echocardiogram demonstrating possible ejection fraction of 35-40%.  Cirrhosis with ascites. Her abdomen is distended and she has had diarrhea. Cirrhosis most likely due to CHF but need to consider GI etiology.  Pt also with Perirectal abcess requiring drainage on 10/14/13.  Clinical Impression  Pt admitted with the above. Pt currently with functional limitations due to the deficits listed below (see PT Problem List). Limited evaluation and ambulation due to bowel incontinence.  Pt with incontinent stool and need total (A) to complete pericare.  Pt will benefit from skilled PT to increase their independence and safety with mobility to allow discharge to the venue listed below.      PT Assessment  Patient needs continued PT services    Follow Up Recommendations  Home health PT;Supervision - Intermittent; [place] aide    Equipment Recommendations  None recommended by PT    Frequency Min 3X/week    Precautions / Restrictions Precautions Precautions: None   Pertinent Vitals/Pain C/o rectal pain but does not rate; placed pillows in chair for comfort; VSS      Mobility  Bed Mobility Bed Mobility: Supine to Sit;Sitting - Scoot to Edge of Bed Supine to Sit: With rails;HOB flat;4: Min guard Sitting - Scoot to Edge of Bed: 4: Min guard;With rail Details for Bed Mobility Assistance: Minguard for safety with cues for proper technique Transfers Transfers: Sit to Stand;Stand to Sit;Stand Pivot Transfers Sit to Stand: 4: Min assist;From chair/3-in-1;From bed Stand to Sit: 4: Min assist;To  chair/3-in-1;To bed Details for Transfer Assistance: (A) to initiate transfer and maintain balance with cues for hand placement Ambulation/Gait Ambulation/Gait Assistance: 4: Min assist Ambulation Distance (Feet): 4 Feet Assistive device: 1 person hand held assist Ambulation/Gait Assistance Details: (A) to maintain balance.  Pt continues to stay in flexed posture and unable to stand upright due to pain.  Gait Pattern: Step-to pattern;Decreased stride length;Shuffle;Antalgic;Trunk flexed;Wide base of support Gait velocity: decreased due to pain Stairs: No    Exercises     PT Diagnosis: Difficulty walking;Generalized weakness;Acute pain  PT Problem List: Decreased strength;Decreased activity tolerance;Decreased balance;Decreased mobility;Decreased knowledge of use of DME;Obesity;Pain PT Treatment Interventions: DME instruction;Gait training;Stair training;Functional mobility training;Therapeutic activities;Therapeutic exercise;Balance training;Patient/family education     PT Goals(Current goals can be found in the care plan section) Acute Rehab PT Goals Patient Stated Goal: To go home and begin to feel better PT Goal Formulation: With patient Time For Goal Achievement: 10/22/13 Potential to Achieve Goals: Good  Visit Information  Last PT Received On: 10/15/13 Assistance Needed: +1 History of Present Illness: 80 year old female with prior mitral valve repair, previously described ejection fraction of 45% with cirrhosis of the liver, thrombocytopenia, abdominal discomfort, with new echocardiogram demonstrating possible ejection fraction of 35-40%.  Cirrhosis with ascites. Her abdomen is distended and she has had diarrhea. Cirrhosis most likely due to CHF but need to consider GI etiology.  Pt also with Perirectal abcess requiring drainage on 10/14/13.       Prior Functioning  Home Living Family/patient expects to be discharged to:: Private residence Living Arrangements: Children;Other  (Comment) (lives with dtr) Available Help at Discharge: Family Type of Home: House Home Access: Stairs to  enter Entrance Stairs-Number of Steps: 1 Entrance Stairs-Rails: None Home Layout: Two level;Able to live on main level with bedroom/bathroom;Full bath on main level Home Equipment: Walker - 2 wheels;Shower seat;Hand held shower head Prior Function Level of Independence: Independent with assistive device(s) Comments: occasional use of RW when needed. Communication Communication: No difficulties Dominant Hand: Left    Cognition  Cognition Arousal/Alertness: Awake/alert Behavior During Therapy: WFL for tasks assessed/performed Overall Cognitive Status: Within Functional Limits for tasks assessed    Extremity/Trunk Assessment Lower Extremity Assessment Lower Extremity Assessment: Generalized weakness   Balance Balance Balance Assessed: Yes Static Sitting Balance Static Sitting - Balance Support: Feet supported Static Sitting - Level of Assistance: 5: Stand by assistance  End of Session PT - End of Session Equipment Utilized During Treatment: Gait belt Activity Tolerance: Patient tolerated treatment well Patient left: in chair;with call bell/phone within reach Nurse Communication: Mobility status  GP     Matilda Fleig 10/15/2013, [place], [place] DPT [phone]

## 2013-10-16 LAB — CARBOXYHEMOGLOBIN
Carboxyhemoglobin: 2.1 % — ABNORMAL HIGH (ref 0.5–1.5)
Methemoglobin: 1.3 % (ref 0.0–1.5)

## 2013-10-16 LAB — BASIC METABOLIC PANEL
BUN: 34 mg/dL — ABNORMAL HIGH (ref 6–23)
CO2: 24 mEq/L (ref 19–32)
Calcium: 7.3 mg/dL — ABNORMAL LOW (ref 8.4–10.5)
Chloride: 105 mEq/L (ref 96–112)
Creatinine, Ser: 1.28 mg/dL — ABNORMAL HIGH (ref 0.50–1.10)
GFR calc Af Amer: 43 mL/min — ABNORMAL LOW (ref >90)
Glucose, Bld: 108 mg/dL — ABNORMAL HIGH (ref 70–99)
Potassium: 3.1 mEq/L — ABNORMAL LOW (ref 3.5–5.1)

## 2013-10-16 LAB — CBC
HCT: 22.1 % — ABNORMAL LOW (ref 36.0–46.0)
Hemoglobin: 7.2 g/dL — ABNORMAL LOW (ref 12.0–15.0)
MCH: 27.8 pg (ref 26.0–34.0)
MCHC: 32.6 g/dL (ref 30.0–36.0)
RBC: 2.59 MIL/uL — ABNORMAL LOW (ref 3.87–5.11)
WBC: 8.1 10*3/uL (ref 4.0–10.5)

## 2013-10-16 LAB — PROTIME-INR: Prothrombin Time: 17.5 seconds — ABNORMAL HIGH (ref 11.6–15.2)

## 2013-10-16 MED ORDER — POTASSIUM CHLORIDE CRYS ER 20 MEQ PO TBCR
20.0000 meq | EXTENDED_RELEASE_TABLET | Freq: Every day | ORAL | Status: DC
  Administered 2013-10-17 – 2013-10-20 (×4): 20 meq via ORAL
  Filled 2013-10-16 (×4): qty 1

## 2013-10-16 MED ORDER — CARVEDILOL 3.125 MG PO TABS
3.1250 mg | ORAL_TABLET | Freq: Two times a day (BID) | ORAL | Status: DC
  Administered 2013-10-16 – 2013-10-17 (×3): 3.125 mg via ORAL
  Filled 2013-10-16 (×5): qty 1

## 2013-10-16 MED ORDER — POTASSIUM CHLORIDE CRYS ER 20 MEQ PO TBCR
40.0000 meq | EXTENDED_RELEASE_TABLET | Freq: Two times a day (BID) | ORAL | Status: AC
  Administered 2013-10-16 (×2): 40 meq via ORAL
  Filled 2013-10-16 (×2): qty 2

## 2013-10-16 NOTE — Progress Notes (Addendum)
[facility] PROGRESS NOTE  [name] [id number] DOB: August 09, 1926 DOA: 10/10/2013 PCP: No primary provider on file.  Assessment/Plan:  Left labia, perirectal Abscess: Continue with  Vancomycin and Zosyn day 5. Surgery following.   WBC pending for this morning. S/P I and D 11-28. Will follow sx recommendation regarding antibiotics.   Acute on chronic renal Failure: Last cr per records at 1.3. Cr on admission at 1.4.  -Cr peak to  to 2. 4. Renal function improved on dopamine. Stable on IV lasix. Labs pending for this morning.   Acute on chronic systolic heart failure - 2D echo  shows further decrease in her EF to 35-40% and anteroseptal hypokinesis. Cardiology consulted, appreciate input.  -Continue with IV lasix, and Dobutamine.  -negative 6 L.   Acute blood loss anemia on Chronic anemia: post surgery. Monitor. Hb pending for this morning. Might need blood transfusion.   S/p mitral valve annuloplasty   Atrial fibrillation -Continue to hold Coumadin. INR at 1.6. Resume coumadin when ok by surgery.   Status post pacemaker   Chronic anticoagulation -will defer to cardio continuation of anticoagulation.   Thrombocytopenia - due to infection process.  Resolved.   Urinary tract infection - Patient started on empiric ceftriaxone on admission. UA with 21 to 50 WBC. Change ceftriaxone to Zosyn due to # 1. Urine culture grew multiple bacterial morphotype.   Liver cirrhosis on imaging - patient without known history of any liver disease. She has ascites, elevated total bilirubin, thrombocytopenia.  viral hepatitis panel negative. GI consulted. Dr Percell Miller think cirrhosis could be due to NASH. No further work up at this point.   Diarrhea; Resolved. C diff negative.   Nausea/vomiting - due to probably acute illness. Resolved.   Code Status: DNR Family Communication: Care discussed with patient.  Disposition Plan: Remain step Down. Transfer to telemetry when off dopamine Gtt.     Consultants:  Surgery.  Cardiology  GI  Procedures:  none  Antibiotics:  Ceftriaxone 11-25---11-26  Zosyn 11-26  Vancomycin 11-26  HPI/Subjective: Had a better day yesterday. Slept well last night.  No worsening dyspnea. No significant pubic pain.    Objective: Filed Vitals:   10/16/13 0344  BP:   Pulse:   Temp: 98 F (36.7 C)  Resp:     Intake/Output Summary (Last 24 hours) at 10/16/13 0637 Last data filed at 10/16/13 0344  Gross per 24 hour  Intake    820 ml  Output   3950 ml  Net  -3130 ml   Filed Weights   10/14/13 0700 10/15/13 0500 10/16/13 0500  Weight: 108.2 kg (238 lb 8.6 oz) 105.9 kg (233 lb 7.5 oz) 106.2 kg (234 lb 2.1 oz)    Exam:   General:  No distress  Cardiovascular: S 1, S 2 RRR  Respiratory: bilateral air movement, no significant crackles.   Abdomen: Bs present, soft, NT, distended.   Musculoskeletal: trace  edema.    Data Reviewed: Basic Metabolic Panel:  Recent Labs Lab 10/11/13 1149 10/12/13 0420 10/13/13 0720 10/14/13 0905 10/15/13 0604  NA 139 138 134* 136 138  K 3.8 3.8 3.5 3.4* 3.6  CL 104 103 99 101 104  CO2 22 21 20 20 20   GLUCOSE 133* 83 103* 96 88  BUN 35* 44* 55* 51* 46*  CREATININE 1.90* 2.12* 2.47* 1.88* 1.61*  CALCIUM 8.0* 8.1* 8.0* 8.2* 7.7*  MG 2.1  --   --   --   --    Liver Function Tests:  Recent Labs Lab 10/10/13 1204  AST 12  ALT 7  ALKPHOS 108  BILITOT 2.1*  PROT 6.1  ALBUMIN 3.1*   No results found for this basename: LIPASE, AMYLASE,  in the last 168 hours No results found for this basename: AMMONIA,  in the last 168 hours CBC:  Recent Labs Lab 10/12/13 0420 10/13/13 0720 10/14/13 0905 10/14/13 1615 10/15/13 0604  WBC 11.7* 10.6* 9.9 10.2 9.7  HGB 9.6* 9.5* 9.6* 8.7* 7.8*  HCT 29.8* 28.3* 28.8* 26.1* 24.3*  MCV 85.4 84.2 84.2 84.2 85.6  PLT 135* 140* 171 192 193   Cardiac Enzymes:  Recent Labs Lab 10/10/13 1843 10/10/13 2255 10/11/13 0510  TROPONINI  <0.30 <0.30 <0.30   BNP (last 3 results)  Recent Labs  10/10/13 1634 10/13/13 1113  PROBNP 6217.0* 8553.0*   CBG: No results found for this basename: GLUCAP,  in the last 168 hours  Recent Results (from the past 240 hour(s))  URINE CULTURE     Status: None   Collection Time    10/10/13  2:32 PM      Result Value Range Status   Specimen Description URINE, RANDOM   Final   Special Requests NONE   Final   Culture  Setup Time     Final   Value: 10/10/2013 20:40     Performed at [facility]     Final   Value: >=100,000 COLONIES/ML     Performed at [facility]   Culture     Final   Value: Multiple bacterial morphotypes present, none predominant. Suggest appropriate recollection if clinically indicated.     Performed at [facility]   Report Status 10/12/2013 FINAL   Final  MRSA PCR SCREENING     Status: None   Collection Time    10/10/13  6:24 PM      Result Value Range Status   MRSA by PCR NEGATIVE  NEGATIVE Final   Comment:            The GeneXpert MRSA Assay (FDA     approved for NASAL specimens     only), is one component of a     comprehensive MRSA colonization     surveillance program. It is not     intended to diagnose MRSA     infection nor to guide or     monitor treatment for     MRSA infections.  CLOSTRIDIUM DIFFICILE BY PCR     Status: None   Collection Time    10/11/13  8:38 PM      Result Value Range Status   C difficile by pcr NEGATIVE  NEGATIVE Final  URINE CULTURE     Status: None   Collection Time    10/14/13  8:47 AM      Result Value Range Status   Specimen Description URINE, CLEAN CATCH   Final   Special Requests A   Final   Culture  Setup Time     Final   Value: 10/14/2013 10:02     Performed at [place]     Final   Value: NO GROWTH     Performed at [facility]   Culture     Final   Value: NO GROWTH     Performed at [facility]   Report Status  10/15/2013 FINAL   Final  ANAEROBIC CULTURE     Status: None  Collection Time    10/14/13 11:41 AM      Result Value Range Status   Specimen Description ABSCESS   Final   Special Requests PERINEAL ABSCESS   Final   Gram Stain     Final   Value: NO WBC SEEN     NOPEPI NO ORGANISMS SEEN     Performed at [facility]   Culture     Final   Value: NO ANAEROBES ISOLATED; CULTURE IN PROGRESS FOR 5 DAYS     Performed at [facility]   Report Status PENDING   Incomplete  CULTURE, ROUTINE-ABSCESS     Status: None   Collection Time    10/14/13 11:41 AM      Result Value Range Status   Specimen Description ABSCESS   Final   Special Requests PERINEAL ABSCESS   Final   Gram Stain     Final   Value: NO WBC SEEN     NOPEI NO ORGANISMS SEEN     Performed at [facility]   Culture PENDING   Incomplete   Report Status PENDING   Incomplete     Studies: Dg Chest Port 1 View  10/15/2013   CLINICAL DATA:  Line placement.  EXAM: PORTABLE CHEST - 1 VIEW  COMPARISON:  10/10/2013.  FINDINGS: PICC line noted with tip projected over superior vena cava. Cardiac pacer noted with lead tips in stable position. Prior median sternotomy. Mitral valve replacement. Cardiomegaly. No CHF. Pulmonary vascularity is normal. Tiny left pleural effusion cannot be completely excluded. Right apical interstitial prominence most consistent with interstitial fibrosis. A component of underlying bronchiectasis may be present.  IMPRESSION: 1. PICC line with tip in good anatomic position. 2. Mitral valve replacement. Median sternotomy. Cardiac pacer. Cardiomegaly. No pulmonary venous congestion. 3. Small left pleural effusion cannot be excluded . 4. Right apical interstitial scarring. A component of bronchiectasis may be present.   Electronically Signed   By: Marcello Moores  Register   On: 10/15/2013 12:59    Scheduled Meds: . beta carotene w/minerals  1 tablet Oral Daily  . ferrous sulfate  325 mg Oral Q breakfast   . furosemide  40 mg Intravenous Q8H  . loratadine  10 mg Oral Daily  . piperacillin-tazobactam (ZOSYN)  IV  3.375 g Intravenous Q8H  . sodium chloride  10-40 mL Intracatheter Q12H  . sodium chloride  3 mL Intravenous Q12H  . vancomycin  1,500 mg Intravenous Q48H  . vancomycin  1,500 mg Intravenous Q48H   Continuous Infusions: . DOBUTamine 5 mcg/kg/min (10/15/13 0700)    Active Problems:   Chronic anticoagulation   Liver cirrhosis   Thrombocytopenia   Nausea and vomiting   Acute on chronic combined systolic and diastolic congestive heart failure   UTI (urinary tract infection)   CKD (chronic kidney disease) stage 3, GFR 30-59 ml/min   Anemia   PAF (paroxysmal atrial fibrillation)   Perirectal abscess   Biventricular cardiac pacemaker in situ    Time spent: 30 minutes.     Miaya Lafontant  [facility] Pager [phone]. If 7PM-7AM, please contact night-coverage at [email or web address], password [facility] 10/16/2013, 6:37 AM  LOS: 6 days

## 2013-10-16 NOTE — Progress Notes (Signed)
2 Days Post-Op  Subjective: Pt tolerating dressing changes well.  Having BM's and urinating well.  Mobilizing some.  Tolerating diet.  Pain well controlled.    Objective: Vital signs in last 24 hours: Temp:  [97.6 F (36.4 C)-98.1 F (36.7 C)] 98 F (36.7 C) (11/30 0344) Pulse Rate:  [69-71] 70 (11/30 0600) Resp:  [15-24] 22 (11/30 0600) BP: (110-145)/(33-52) 132/43 mmHg (11/30 0600) SpO2:  [94 %-100 %] 94 % (11/30 0600) Weight:  [234 lb 2.1 oz (106.2 kg)] 234 lb 2.1 oz (106.2 kg) (11/30 0500) Last BM Date: 10/15/13  Intake/Output from previous day: 11/29 0701 - 11/30 0700 In: 812 [P.O.:240; I.V.:72; IV Piggyback:500] Out: [id number] [Urine:3950] Intake/Output this shift:    PE: Gen:  Alert, NAD, pleasant Perineum: Peri-wound shows less erythema and induration, no areas of fluctuance, no significant bloody drainage   Lab Results:   Recent Labs  10/14/13 1615 10/15/13 0604  WBC 10.2 9.7  HGB 8.7* 7.8*  HCT 26.1* 24.3*  PLT 192 193   BMET  Recent Labs  10/15/13 0604 10/16/13 0500  NA 138 142  K 3.6 3.1*  CL 104 105  CO2 20 24  GLUCOSE 88 108*  BUN 46* 34*  CREATININE 1.61* 1.28*  CALCIUM 7.7* 7.3*   PT/INR  Recent Labs  10/15/13 0604 10/16/13 0500  LABPROT 15.8* 17.5*  INR 1.29 1.48   CMP     Component Value Date/Time   NA 142 10/16/2013 0500   K 3.1* 10/16/2013 0500   CL 105 10/16/2013 0500   CO2 24 10/16/2013 0500   GLUCOSE 108* 10/16/2013 0500   BUN 34* 10/16/2013 0500   CREATININE 1.28* 10/16/2013 0500   CALCIUM 7.3* 10/16/2013 0500   PROT 6.1 10/10/2013 1204   ALBUMIN 3.1* 10/10/2013 1204   AST 12 10/10/2013 1204   ALT 7 10/10/2013 1204   ALKPHOS 108 10/10/2013 1204   BILITOT 2.1* 10/10/2013 1204   GFRNONAA 37* 10/16/2013 0500   GFRAA 43* 10/16/2013 0500   Lipase  No results found for this basename: lipase       Studies/Results: Dg Chest Port 1 View  10/15/2013   CLINICAL DATA:  Line placement.  EXAM: PORTABLE CHEST - 1 VIEW   COMPARISON:  10/10/2013.  FINDINGS: PICC line noted with tip projected over superior vena cava. Cardiac pacer noted with lead tips in stable position. Prior median sternotomy. Mitral valve replacement. Cardiomegaly. No CHF. Pulmonary vascularity is normal. Tiny left pleural effusion cannot be completely excluded. Right apical interstitial prominence most consistent with interstitial fibrosis. A component of underlying bronchiectasis may be present.  IMPRESSION: 1. PICC line with tip in good anatomic position. 2. Mitral valve replacement. Median sternotomy. Cardiac pacer. Cardiomegaly. No pulmonary venous congestion. 3. Small left pleural effusion cannot be excluded . 4. Right apical interstitial scarring. A component of bronchiectasis may be present.   Electronically Signed   By: Marcello Moores  Register   On: 10/15/2013 12:59    Anti-infectives: Anti-infectives   Start     Dose/Rate Route Frequency Ordered Stop   10/15/13 2000  piperacillin-tazobactam (ZOSYN) IVPB 3.375 g     3.375 g 12.5 mL/hr over 240 Minutes Intravenous 3 times per day 10/15/13 1537     10/15/13 1645  vancomycin (VANCOCIN) 1,500 mg in sodium chloride 0.9 % 500 mL IVPB     1,500 mg 250 mL/hr over 120 Minutes Intravenous Every 48 hours 10/15/13 1543     10/14/13 1300  vancomycin (VANCOCIN) 1,500 mg in sodium chloride  0.9 % 500 mL IVPB     1,500 mg 250 mL/hr over 120 Minutes Intravenous Every 48 hours 10/12/13 1131     10/12/13 1300  vancomycin (VANCOCIN) 2,000 mg in sodium chloride 0.9 % 500 mL IVPB     2,000 mg 250 mL/hr over 120 Minutes Intravenous  Once 10/12/13 1131 10/12/13 1500   10/12/13 1230  piperacillin-tazobactam (ZOSYN) IVPB 2.25 g  Status:  Discontinued     2.25 g 100 mL/hr over 30 Minutes Intravenous Every 6 hours 10/12/13 1131 10/15/13 1537   10/11/13 0600  cefTRIAXone (ROCEPHIN) 1 g in dextrose 5 % 50 mL IVPB  Status:  Discontinued     1 g 100 mL/hr over 30 Minutes Intravenous Every 24 hours 10/10/13 1828 10/12/13  1021   10/10/13 1530  cefTRIAXone (ROCEPHIN) 1 g in dextrose 5 % 50 mL IVPB     1 g 100 mL/hr over 30 Minutes Intravenous  Once 10/10/13 1529 10/10/13 1635       Assessment/Plan 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 Continue BID packing changes, tolerated bedside dressing change well  2. Cont vanc/zosyn, WBC normal  3. Monitor H/H given blood loss during surgery  4. No need for additional debridements at this time, WBC is normal, and tolerating dressing changes.  Okay to d/c home with [facility] wound care (placed order today) when medically stable per primary team. 5. F/u with Donne Hazel in 2-3 weeks, office should call with appt time 6. Will discuss with Dr. Donne Hazel regarding restarting coumadin, but this will not be today given the amount of blood loss during surgery.  May have to hold until Tuesday.    LOS: 6 days    Coralie Keens 10/16/2013, 7:58 AM Pager: [phone]

## 2013-10-16 NOTE — Progress Notes (Addendum)
SUBJECTIVE:  Feels better  OBJECTIVE:   Vitals:   Filed Vitals:   10/16/13 0344 10/16/13 0400 10/16/13 0500 10/16/13 0600  BP:  145/40 137/41 132/43  Pulse:  69 70 70  Temp: 98 F (36.7 C)     TempSrc:      Resp:  20 21 22   Height:      Weight:   234 lb 2.1 oz (106.2 kg)   SpO2:  94% 95% 94%   I&O's:   Intake/Output Summary (Last 24 hours) at 10/16/13 0800 Last data filed at 10/16/13 0344  Gross per 24 hour  Intake    804 ml  Output   3950 ml  Net  -3146 ml   TELEMETRY: Reviewed telemetry pt in V paced     PHYSICAL EXAM General: Well developed, well nourished, in no acute distress Head: Eyes PERRLA, No xanthomas.   Normal cephalic and atramatic  Lungs:   Clear bilaterally to auscultation and percussion. Heart:   HRRR S1 S2 Pulses are 2+ & equal. Abdomen: Bowel sounds are positive, abdomen soft and non-tender without masses Extremities:   1+ edema Neuro: Alert and oriented X 3. Psych:  Good affect, responds appropriately   LABS: Basic Metabolic Panel:  Recent Labs  10/15/13 0604 10/16/13 0500  NA 138 142  K 3.6 3.1*  CL 104 105  CO2 20 24  GLUCOSE 88 108*  BUN 46* 34*  CREATININE 1.61* 1.28*  CALCIUM 7.7* 7.3*   Liver Function Tests: No results found for this basename: AST, ALT, ALKPHOS, BILITOT, PROT, ALBUMIN,  in the last 72 hours No results found for this basename: LIPASE, AMYLASE,  in the last 72 hours CBC:  Recent Labs  10/14/13 1615 10/15/13 0604  WBC 10.2 9.7  HGB 8.7* 7.8*  HCT 26.1* 24.3*  MCV 84.2 85.6  PLT 192 193   Cardiac Enzymes: No results found for this basename: CKTOTAL, CKMB, CKMBINDEX, TROPONINI,  in the last 72 hours BNP: No components found with this basename: POCBNP,  D-Dimer: No results found for this basename: DDIMER,  in the last 72 hours Hemoglobin A1C: No results found for this basename: HGBA1C,  in the last 72 hours Fasting Lipid Panel: No results found for this basename: CHOL, HDL, LDLCALC, TRIG, CHOLHDL,  LDLDIRECT,  in the last 72 hours Thyroid Function Tests: No results found for this basename: TSH, T4TOTAL, FREET3, T3FREE, THYROIDAB,  in the last 72 hours Anemia Panel: No results found for this basename: VITAMINB12, FOLATE, FERRITIN, TIBC, IRON, RETICCTPCT,  in the last 72 hours Coag Panel:   Lab Results  Component Value Date   INR 1.48 10/16/2013   INR 1.29 10/15/2013   INR 1.32 10/14/2013    RADIOLOGY: Ct Abdomen Pelvis Wo Contrast  10/10/2013   CLINICAL DATA:  Vomiting and diarrhea for 3-4 days, generalized weakness, on Coumadin, history is CHF  EXAM: CT ABDOMEN AND PELVIS WITHOUT CONTRAST  TECHNIQUE: Multidetector CT imaging of the abdomen and pelvis was performed following the standard protocol without intravenous contrast.  COMPARISON:  Lumbar spine CT -06/15/2009  FINDINGS: The lack of intravenous contrast limits the ability to evaluate solid abdominal organs.  There is mild nodularity of the hepatic contour, nonspecific though could be seen in the setting of cirrhosis. This finding is associated with small volume of intra-abdominal ascites. Post cholecystectomy. There is apparent fusiform ectasia of the origin of the portal vein regional to the confluence of the superior mesenteric and splenic veins measuring approximately 2.8 cm in greatest  oblique axial dimension (image 31, series 2).  The bilateral kidneys are slightly atrophic compatible with provided history of renal insufficiency. No discrete renal stones. No urinary obstruction or perinephric stranding. Normal noncontrast appearance of the bilateral adrenal glands. There is a crescentic approximately 1.6 x 1.4 cm presumed partially calcified splenic artery aneurysm (image 26, series 2). Additional smaller presumed splenic aneurysms are seen about the splenic hilum.  Scattered colonic diverticulosis without evidence of diverticulitis. The colon is largely decompressed. The bowel is otherwise normal in course and caliber without  discrete area of wall thickening or evidence of obstruction. The appendix is not definitely identified, however there is no definitive inflammatory change within the right lower abdominal quadrant. No pneumoperitoneum, pneumatosis or portal venous gas.  Atherosclerotic plaque within a normal caliber abdominal aorta. Scattered shotty retroperitoneal lymph nodes are individually not enlarged by size criteria. No retroperitoneal, mesenteric, pelvic or inguinal lymphadenopathy on this noncontrast examination.  The pelvic organs her expectantly atrophic. No discrete adnexal lesion.  Limited visualization of the lower thorax demonstrates minimal subsegmental atelectasis within the caudal aspect of the right middle lobe in an left lower lobe. No focal airspace opacities. No pleural effusion.  Cardiomegaly. Ventricular pacer leads are seen within the right atrium, ventricle and coronary sinus. No pericardial effusion.  No acute or aggressive osseous abnormalities. There is mild straightening expected lumbar lordosis. Grossly unchanged mild (approximately 25%) compression deformity involving the anterior aspect of the superior endplate of the L4 vertebral body. Grossly unchanged mild (approximately 5 mm) of retrolisthesis of L4 upon L5.  IMPRESSION: 1. Colonic diverticulosis without definite evidence of diverticulitis on this noncontrast examination. The colon is decompressed without definite evidence of obstruction. 2. Mild nodularity of the hepatic contour suggestive of cirrhosis. This finding is associated with small volume intra-abdominal ascites. 3. Incidental note made of an approximately 1.6 cm splenic artery aneurysm - this is of doubtful clinical concern in this postmenopausal patient. 4. Suspected fusiform ectasia of the origin of the portal vein, measuring approximately 1.8 cm, incompletely evaluated on this noncontrast examination and of uncertain clinical significant. 5. Cardiomegaly.   Electronically Signed    By: Sandi Mariscal M.D.   On: 10/10/2013 15:48   Dg Chest 2 View  10/10/2013   CLINICAL DATA:  Cough.  EXAM: CHEST  2 VIEW  COMPARISON:  November 29, 2007.  FINDINGS: Stable cardiomegaly. Status post mitral valve replacement. Left-sided pacemaker is unchanged in position. No pneumothorax or pleural effusion is noted. No acute pulmonary disease is noted. Bony thorax is intact.  IMPRESSION: No acute cardiopulmonary abnormality seen.   Electronically Signed   By: Sabino Dick M.D.   On: 10/10/2013 17:04   Dg Chest Port 1 View  10/15/2013   CLINICAL DATA:  Line placement.  EXAM: PORTABLE CHEST - 1 VIEW  COMPARISON:  10/10/2013.  FINDINGS: PICC line noted with tip projected over superior vena cava. Cardiac pacer noted with lead tips in stable position. Prior median sternotomy. Mitral valve replacement. Cardiomegaly. No CHF. Pulmonary vascularity is normal. Tiny left pleural effusion cannot be completely excluded. Right apical interstitial prominence most consistent with interstitial fibrosis. A component of underlying bronchiectasis may be present.  IMPRESSION: 1. PICC line with tip in good anatomic position. 2. Mitral valve replacement. Median sternotomy. Cardiac pacer. Cardiomegaly. No pulmonary venous congestion. 3. Small left pleural effusion cannot be excluded . 4. Right apical interstitial scarring. A component of bronchiectasis may be present.   Electronically Signed   By: Marcello Moores  Register  On: 10/15/2013 12:59    ASSESSMENT/PLAN:  79 year old female with prior mitral valve repair, previously described ejection fraction of 45% with cirrhosis of the liver, thrombocytopenia, abdominal discomfort, with new echocardiogram demonstrating possible ejection fraction of 35-40%.  1. Acute on chronic systolic/diastolic heart failure- Ejection fraction of 35-40% was a gross estimation however there did appear to be dysfunction present. Her mitral valve repair is intact on echo. Her creatinine bumped with IV Lasix  and Lasix was held. BP much more stable today after starting IV dobutamine. Urine output has significantly picked up and she put out over 6L yesterday and weight is down 5lb since admit. Dig stopped due to renal insuff. No ACE I due to renal failure. Appreciated HF service input. Will start to wean off dobutamine and continue IV diuretics. BP stable so will add Coreg 3.125mg  BID 2. Cirrhosis with ascites. Her abdomen is distended and she has had diarrhea. Cirrhosis most likely due to CHF but need to consider GI etiology. Appreciatte GI input  3. Perirectal abcess s/p drainage  4. Severe MR S/P MV repair with Carpentier ring annuloplasty  5. PAF on chronic anticoagulation - coumadin reversed for surgery - restart when ok with surgery  6. CKD stage III - creatinine much improved with diuresis and Dobutamine 7. Place sequential compression devices for DVT prophylaxis  8.  Hypokalemia - replete       Sueanne Margarita, MD  10/16/2013  8:00 AM

## 2013-10-16 NOTE — Progress Notes (Signed)
General surgery attending:  I have interviewed and examined this patient this morning. I agree with the assessment and treatment plan outlined by Ms. Dortch. Wound care discussed with nursing.  Twice a day dressing changes will continue. I would hold off restarting Coumadin until tomorrow.   [name]. Dalbert Batman, M.D., [facility] General and Minimally invasive Surgery Breast and Colorectal Surgery Office:   [phone]

## 2013-10-17 LAB — BASIC METABOLIC PANEL
CO2: 26 mEq/L (ref 19–32)
Calcium: 7.2 mg/dL — ABNORMAL LOW (ref 8.4–10.5)
Chloride: 105 mEq/L (ref 96–112)
Creatinine, Ser: 1.33 mg/dL — ABNORMAL HIGH (ref 0.50–1.10)
GFR calc Af Amer: 41 mL/min — ABNORMAL LOW (ref >90)
GFR calc non Af Amer: 35 mL/min — ABNORMAL LOW (ref >90)
Potassium: 3.8 mEq/L (ref 3.5–5.1)
Sodium: 143 mEq/L (ref 135–145)

## 2013-10-17 LAB — CBC
HCT: 22.2 % — ABNORMAL LOW (ref 36.0–46.0)
MCH: 27.8 pg (ref 26.0–34.0)
MCHC: 32.4 g/dL (ref 30.0–36.0)
MCV: 85.7 fL (ref 78.0–100.0)
Platelets: 181 10*3/uL (ref 150–400)
RDW: 15.8 % — ABNORMAL HIGH (ref 11.5–15.5)

## 2013-10-17 LAB — CARBOXYHEMOGLOBIN
Methemoglobin: 0.7 % (ref 0.0–1.5)
O2 Saturation: 59.8 %
Total hemoglobin: 7 g/dL — ABNORMAL LOW (ref 12.0–16.0)

## 2013-10-17 LAB — PROTIME-INR
INR: 1.67 — ABNORMAL HIGH (ref 0.00–1.49)
Prothrombin Time: 19.2 seconds — ABNORMAL HIGH (ref 11.6–15.2)

## 2013-10-17 MED ORDER — FERROUS SULFATE 325 (65 FE) MG PO TABS
325.0000 mg | ORAL_TABLET | Freq: Two times a day (BID) | ORAL | Status: DC
  Administered 2013-10-18 – 2013-10-20 (×5): 325 mg via ORAL
  Filled 2013-10-17 (×8): qty 1

## 2013-10-17 MED ORDER — POTASSIUM CHLORIDE CRYS ER 20 MEQ PO TBCR
40.0000 meq | EXTENDED_RELEASE_TABLET | Freq: Once | ORAL | Status: AC
  Administered 2013-10-17: 40 meq via ORAL
  Filled 2013-10-17: qty 2

## 2013-10-17 MED ORDER — COLCHICINE 0.6 MG PO TABS
0.6000 mg | ORAL_TABLET | Freq: Two times a day (BID) | ORAL | Status: DC
  Administered 2013-10-17 – 2013-10-20 (×7): 0.6 mg via ORAL
  Filled 2013-10-17 (×8): qty 1

## 2013-10-17 MED ORDER — VANCOMYCIN HCL 10 G IV SOLR
1500.0000 mg | INTRAVENOUS | Status: DC
  Administered 2013-10-17: 1500 mg via INTRAVENOUS
  Filled 2013-10-17 (×4): qty 1500

## 2013-10-17 NOTE — Progress Notes (Addendum)
Clinical Social Work Department CLINICAL SOCIAL WORK PLACEMENT NOTE 10/17/2013  Patient:  [name], [name]  Account Number:  [id number] Admit date:  10/10/2013  Clinical Social Worker:  Ky Barban, Latanya Presser  Date/time:  10/17/2013 04:20 PM  Clinical Social Work is seeking post-discharge placement for this patient at the following level of care:   SKILLED NURSING   (*CSW will update this form in Epic as items are completed)   10/17/2013  Patient/family provided with [place] Department of Clinical Social Work's list of facilities offering this level of care within the geographic area requested by the patient (or if unable, by the patient's family).  10/17/2013  Patient/family informed of their freedom to choose among providers that offer the needed level of care, that participate in Medicare, Medicaid or managed care program needed by the patient, have an available bed and are willing to accept the patient.  10/17/2013  Patient/family informed of [facility]' ownership interest in [facility], as well as of the fact that they are under no obligation to receive care at this facility.  PASARR submitted to EDS on 10/17/2013 PASARR number received from EDS on 10/17/2013  FL2 transmitted to all facilities in geographic area requested by pt/family on  10/17/2013 FL2 transmitted to all facilities within larger geographic area on   Patient informed that his/her managed care company has contracts with or will negotiate with  certain facilities, including the following:     Patient/family informed of bed offers received:  10/18/2013 Patient chooses bed at  Physician recommends and patient chooses bed at    Patient to be transferred to  on   Patient to be transferred to facility by   The following physician request were entered in Epic:   Additional Comments:    Ky Barban, MSW, [place] Worker [phone]

## 2013-10-17 NOTE — Progress Notes (Signed)
Patient ID: [name], female   DOB: [birth year], 79 y.o.   MRN: [id number] Advanced Heart Failure Rounding Note  SUBJECTIVE:  Complains of gout pain in left foot.  Diuresing well.  CVP 12.    . beta carotene w/minerals  1 tablet Oral Daily  . colchicine  0.6 mg Oral BID  . ferrous sulfate  325 mg Oral Q breakfast  . furosemide  40 mg Intravenous Q8H  . loratadine  10 mg Oral Daily  . piperacillin-tazobactam (ZOSYN)  IV  3.375 g Intravenous Q8H  . potassium chloride  20 mEq Oral Daily  . potassium chloride  40 mEq Oral Once  . sodium chloride  10-40 mL Intracatheter Q12H  . sodium chloride  3 mL Intravenous Q12H  . vancomycin  1,500 mg Intravenous Q48H  dobutamine @ 2.5    Filed Vitals:   10/17/13 0359 10/17/13 0539 10/17/13 0605 10/17/13 0755  BP: 138/47  135/47 150/48  Pulse: 70  70 70  Temp: 98 F (36.7 C)   98.2 F (36.8 C)  TempSrc: Oral     Resp: 25   28  Height:      Weight:  236 lb 1.8 oz (107.1 kg)    SpO2: 95%   98%    Intake/Output Summary (Last 24 hours) at 10/17/13 0758 Last data filed at 10/17/13 0414  Gross per 24 hour  Intake 121.06 ml  Output   2700 ml  Net -2578.94 ml    LABS: Basic Metabolic Panel:  Recent Labs  10/16/13 0500 10/17/13 0500  NA 142 143  K 3.1* 3.8  CL 105 105  CO2 24 26  GLUCOSE 108* 102*  BUN 34* 25*  CREATININE 1.28* 1.33*  CALCIUM 7.3* 7.2*   Liver Function Tests: No results found for this basename: AST, ALT, ALKPHOS, BILITOT, PROT, ALBUMIN,  in the last 72 hours No results found for this basename: LIPASE, AMYLASE,  in the last 72 hours CBC:  Recent Labs  10/15/13 0604 10/16/13 1044  WBC 9.7 8.1  HGB 7.8* 7.2*  HCT 24.3* 22.1*  MCV 85.6 85.3  PLT 193 193   Cardiac Enzymes: No results found for this basename: CKTOTAL, CKMB, CKMBINDEX, TROPONINI,  in the last 72 hours BNP: No components found with this basename: POCBNP,  D-Dimer: No results found for this basename: DDIMER,  in the last 72  hours Hemoglobin A1C: No results found for this basename: HGBA1C,  in the last 72 hours Fasting Lipid Panel: No results found for this basename: CHOL, HDL, LDLCALC, TRIG, CHOLHDL, LDLDIRECT,  in the last 72 hours Thyroid Function Tests: No results found for this basename: TSH, T4TOTAL, FREET3, T3FREE, THYROIDAB,  in the last 72 hours Anemia Panel: No results found for this basename: VITAMINB12, FOLATE, FERRITIN, TIBC, IRON, RETICCTPCT,  in the last 72 hours  RADIOLOGY: Ct Abdomen Pelvis Wo Contrast  10/10/2013   CLINICAL DATA:  Vomiting and diarrhea for 3-4 days, generalized weakness, on Coumadin, history is CHF  EXAM: CT ABDOMEN AND PELVIS WITHOUT CONTRAST  TECHNIQUE: Multidetector CT imaging of the abdomen and pelvis was performed following the standard protocol without intravenous contrast.  COMPARISON:  Lumbar spine CT -06/15/2009  FINDINGS: The lack of intravenous contrast limits the ability to evaluate solid abdominal organs.  There is mild nodularity of the hepatic contour, nonspecific though could be seen in the setting of cirrhosis. This finding is associated with small volume of intra-abdominal ascites. Post cholecystectomy. There is apparent fusiform ectasia of the origin  of the portal vein regional to the confluence of the superior mesenteric and splenic veins measuring approximately 2.8 cm in greatest oblique axial dimension (image 31, series 2).  The bilateral kidneys are slightly atrophic compatible with provided history of renal insufficiency. No discrete renal stones. No urinary obstruction or perinephric stranding. Normal noncontrast appearance of the bilateral adrenal glands. There is a crescentic approximately 1.6 x 1.4 cm presumed partially calcified splenic artery aneurysm (image 26, series 2). Additional smaller presumed splenic aneurysms are seen about the splenic hilum.  Scattered colonic diverticulosis without evidence of diverticulitis. The colon is largely decompressed. The  bowel is otherwise normal in course and caliber without discrete area of wall thickening or evidence of obstruction. The appendix is not definitely identified, however there is no definitive inflammatory change within the right lower abdominal quadrant. No pneumoperitoneum, pneumatosis or portal venous gas.  Atherosclerotic plaque within a normal caliber abdominal aorta. Scattered shotty retroperitoneal lymph nodes are individually not enlarged by size criteria. No retroperitoneal, mesenteric, pelvic or inguinal lymphadenopathy on this noncontrast examination.  The pelvic organs her expectantly atrophic. No discrete adnexal lesion.  Limited visualization of the lower thorax demonstrates minimal subsegmental atelectasis within the caudal aspect of the right middle lobe in an left lower lobe. No focal airspace opacities. No pleural effusion.  Cardiomegaly. Ventricular pacer leads are seen within the right atrium, ventricle and coronary sinus. No pericardial effusion.  No acute or aggressive osseous abnormalities. There is mild straightening expected lumbar lordosis. Grossly unchanged mild (approximately 25%) compression deformity involving the anterior aspect of the superior endplate of the L4 vertebral body. Grossly unchanged mild (approximately 5 mm) of retrolisthesis of L4 upon L5.  IMPRESSION: 1. Colonic diverticulosis without definite evidence of diverticulitis on this noncontrast examination. The colon is decompressed without definite evidence of obstruction. 2. Mild nodularity of the hepatic contour suggestive of cirrhosis. This finding is associated with small volume intra-abdominal ascites. 3. Incidental note made of an approximately 1.6 cm splenic artery aneurysm - this is of doubtful clinical concern in this postmenopausal patient. 4. Suspected fusiform ectasia of the origin of the portal vein, measuring approximately 1.8 cm, incompletely evaluated on this noncontrast examination and of uncertain clinical  significant. 5. Cardiomegaly.   Electronically Signed   By: Sandi Mariscal M.D.   On: 10/10/2013 15:48   Dg Chest 2 View  10/10/2013   CLINICAL DATA:  Cough.  EXAM: CHEST  2 VIEW  COMPARISON:  November 29, 2007.  FINDINGS: Stable cardiomegaly. Status post mitral valve replacement. Left-sided pacemaker is unchanged in position. No pneumothorax or pleural effusion is noted. No acute pulmonary disease is noted. Bony thorax is intact.  IMPRESSION: No acute cardiopulmonary abnormality seen.   Electronically Signed   By: Sabino Dick M.D.   On: 10/10/2013 17:04   Dg Chest Port 1 View  10/15/2013   CLINICAL DATA:  Line placement.  EXAM: PORTABLE CHEST - 1 VIEW  COMPARISON:  10/10/2013.  FINDINGS: PICC line noted with tip projected over superior vena cava. Cardiac pacer noted with lead tips in stable position. Prior median sternotomy. Mitral valve replacement. Cardiomegaly. No CHF. Pulmonary vascularity is normal. Tiny left pleural effusion cannot be completely excluded. Right apical interstitial prominence most consistent with interstitial fibrosis. A component of underlying bronchiectasis may be present.  IMPRESSION: 1. PICC line with tip in good anatomic position. 2. Mitral valve replacement. Median sternotomy. Cardiac pacer. Cardiomegaly. No pulmonary venous congestion. 3. Small left pleural effusion cannot be excluded . 4. Right  apical interstitial scarring. A component of bronchiectasis may be present.   Electronically Signed   By: Marcello Moores  Register   On: 10/15/2013 12:59    PHYSICAL EXAM General: NAD Neck: JVP 12 cm, no thyromegaly or thyroid nodule.  Lungs: Slight crackles at bases. CV: Nondisplaced PMI.  Heart regular S1/S2, no S3/S4, 2/6 SEM.  1+ ankle edema.   Abdomen: Soft, nontender, no hepatosplenomegaly, no distention.  Neurologic: Alert and oriented x 3.  Psych: Normal affect. Extremities: No clubbing or cyanosis.   TELEMETRY: Reviewed telemetry pt in afib with pacing  ASSESSMENT AND  PLAN: 1. 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 She remains volume overloaded on exam. BP and creatinine stable.  CVP 12 and JVP up.  She is diuresing reasonably well on current Lasix dose. - She needs further diuresis. Continue Lasix 40 mg IV every 8 hrs.  - Continue dobutamine @ 2.5.   - Follow CVP, co-ox has been good.   - EF seems to be lower than past but echo difficult. Had no significant CAD on cath about 10 years ago. Would hold off on LHC for now with AKI.  - Hold off on beta blocker given dobutamine use, no ACEI with AKI.  2. AKI on CKD: Creatinine improved on dobutamine.  3. Atrial fibrillation: Chronic. Paced rhythm, rate stable despite inotropes. Start warfarin when stable from surgical standpoint (hold off for now with falling hemoglobin). 4. Cirrhosis: Possibly due to right heart failure/hepatic congestion. Only mild amount of ascites noted on CT.  5. Peri-rectal abscess: s/p surgery. 6. MV repair: Stable without significant MR on last echo.  7. Gout pain: Start colchicine.  8. Anemia: Post-op.  Hemoglobin 7.2.  Transfuse hemoglobin < 7.   [name] 10/17/2013 8:04 AM

## 2013-10-17 NOTE — Progress Notes (Signed)
ANTIBIOTIC CONSULT NOTE - Follow up  Pharmacy Consult for vancomycin/zosyn Indication: perirectal Abscess  No Known Allergies  Labs:  Recent Labs  10/15/13 0604 10/16/13 0500 10/16/13 1044 10/17/13 0500  WBC 9.7  --  8.1 8.7  HGB 7.8*  --  7.2* 7.2*  PLT 193  --  193 181  CREATININE 1.61* 1.28*  --  1.33*   Estimated Creatinine Clearance: 36.3 ml/min (by C-G formula based on Cr of 1.33). No results found for this basename: Letta Median, VANCORANDOM, GENTTROUGH, GENTPEAK, GENTRANDOM, TOBRATROUGH, TOBRAPEAK, TOBRARND, AMIKACINPEAK, AMIKACINTROU, AMIKACIN,  in the last 72 hours   Assessment: 79 yo female with UTI and left labia, perirectal abscess continues on IV vancomycin/zosyn started 11/ 26,  POD#3 s/p I&D of perineal abscess.  WBC= 9.7, SCr improved to 1.33 and CrCl ~30 ml/min.   Perineal abscess culture no growth to date. Urine culture 11/28 negative.   Goal of Therapy:  Vancomycin trough level 15-20 mcg/ml  Plan:  -Continue Zosyn 3.375 grams iv Q8 hours - 4 hr infusion -Increase Vancomycin to 1500 mg iv Q 24 hours - Continue to follow  Thank you. Anette Guarneri, PharmD [phone]   10/17/2013 10:17 AM

## 2013-10-17 NOTE — Progress Notes (Signed)
SUBJECTIVE:  No complaints  OBJECTIVE:   Vitals:   Filed Vitals:   10/16/13 2357 10/17/13 0359 10/17/13 0539 10/17/13 0605  BP: 135/48 138/47  135/47  Pulse: 73 70  70  Temp: 97.9 F (36.6 C) 98 F (36.7 C)    TempSrc: Oral Oral    Resp: 22 25    Height:      Weight:   236 lb 1.8 oz (107.1 kg)   SpO2: 98% 95%     I&O's:   Intake/Output Summary (Last 24 hours) at 10/17/13 0753 Last data filed at 10/17/13 0414  Gross per 24 hour  Intake 121.06 ml  Output   2700 ml  Net -2578.94 ml   TELEMETRY: Reviewed telemetry pt in V paced     PHYSICAL EXAM General: Well developed, well nourished, in no acute distress Head: Eyes PERRLA, No xanthomas.   Normal cephalic and atramatic  Lungs:   Few crackles at left base Heart:   HRRR S1 S2 Pulses are 2+ & equal. Abdomen: Bowel sounds are positive, abdomen soft and non-tender without masses Extremities:   2+ edema Neuro: Alert and oriented X 3. Psych:  Good affect, responds appropriately   LABS: Basic Metabolic Panel:  Recent Labs  10/16/13 0500 10/17/13 0500  NA 142 143  K 3.1* 3.8  CL 105 105  CO2 24 26  GLUCOSE 108* 102*  BUN 34* 25*  CREATININE 1.28* 1.33*  CALCIUM 7.3* 7.2*   Liver Function Tests: No results found for this basename: AST, ALT, ALKPHOS, BILITOT, PROT, ALBUMIN,  in the last 72 hours No results found for this basename: LIPASE, AMYLASE,  in the last 72 hours CBC:  Recent Labs  10/15/13 0604 10/16/13 1044  WBC 9.7 8.1  HGB 7.8* 7.2*  HCT 24.3* 22.1*  MCV 85.6 85.3  PLT 193 193   Coag Panel:   Lab Results  Component Value Date   INR 1.67* 10/17/2013   INR 1.48 10/16/2013   INR 1.29 10/15/2013    RADIOLOGY: Ct Abdomen Pelvis Wo Contrast  10/10/2013   CLINICAL DATA:  Vomiting and diarrhea for 3-4 days, generalized weakness, on Coumadin, history is CHF  EXAM: CT ABDOMEN AND PELVIS WITHOUT CONTRAST  TECHNIQUE: Multidetector CT imaging of the abdomen and pelvis was performed following the  standard protocol without intravenous contrast.  COMPARISON:  Lumbar spine CT -06/15/2009  FINDINGS: The lack of intravenous contrast limits the ability to evaluate solid abdominal organs.  There is mild nodularity of the hepatic contour, nonspecific though could be seen in the setting of cirrhosis. This finding is associated with small volume of intra-abdominal ascites. Post cholecystectomy. There is apparent fusiform ectasia of the origin of the portal vein regional to the confluence of the superior mesenteric and splenic veins measuring approximately 2.8 cm in greatest oblique axial dimension (image 31, series 2).  The bilateral kidneys are slightly atrophic compatible with provided history of renal insufficiency. No discrete renal stones. No urinary obstruction or perinephric stranding. Normal noncontrast appearance of the bilateral adrenal glands. There is a crescentic approximately 1.6 x 1.4 cm presumed partially calcified splenic artery aneurysm (image 26, series 2). Additional smaller presumed splenic aneurysms are seen about the splenic hilum.  Scattered colonic diverticulosis without evidence of diverticulitis. The colon is largely decompressed. The bowel is otherwise normal in course and caliber without discrete area of wall thickening or evidence of obstruction. The appendix is not definitely identified, however there is no definitive inflammatory change within the right lower abdominal  quadrant. No pneumoperitoneum, pneumatosis or portal venous gas.  Atherosclerotic plaque within a normal caliber abdominal aorta. Scattered shotty retroperitoneal lymph nodes are individually not enlarged by size criteria. No retroperitoneal, mesenteric, pelvic or inguinal lymphadenopathy on this noncontrast examination.  The pelvic organs her expectantly atrophic. No discrete adnexal lesion.  Limited visualization of the lower thorax demonstrates minimal subsegmental atelectasis within the caudal aspect of the right  middle lobe in an left lower lobe. No focal airspace opacities. No pleural effusion.  Cardiomegaly. Ventricular pacer leads are seen within the right atrium, ventricle and coronary sinus. No pericardial effusion.  No acute or aggressive osseous abnormalities. There is mild straightening expected lumbar lordosis. Grossly unchanged mild (approximately 25%) compression deformity involving the anterior aspect of the superior endplate of the L4 vertebral body. Grossly unchanged mild (approximately 5 mm) of retrolisthesis of L4 upon L5.  IMPRESSION: 1. Colonic diverticulosis without definite evidence of diverticulitis on this noncontrast examination. The colon is decompressed without definite evidence of obstruction. 2. Mild nodularity of the hepatic contour suggestive of cirrhosis. This finding is associated with small volume intra-abdominal ascites. 3. Incidental note made of an approximately 1.6 cm splenic artery aneurysm - this is of doubtful clinical concern in this postmenopausal patient. 4. Suspected fusiform ectasia of the origin of the portal vein, measuring approximately 1.8 cm, incompletely evaluated on this noncontrast examination and of uncertain clinical significant. 5. Cardiomegaly.   Electronically Signed   By: Sandi Mariscal M.D.   On: 10/10/2013 15:48   Dg Chest 2 View  10/10/2013   CLINICAL DATA:  Cough.  EXAM: CHEST  2 VIEW  COMPARISON:  November 29, 2007.  FINDINGS: Stable cardiomegaly. Status post mitral valve replacement. Left-sided pacemaker is unchanged in position. No pneumothorax or pleural effusion is noted. No acute pulmonary disease is noted. Bony thorax is intact.  IMPRESSION: No acute cardiopulmonary abnormality seen.   Electronically Signed   By: Sabino Dick M.D.   On: 10/10/2013 17:04   Dg Chest Port 1 View  10/15/2013   CLINICAL DATA:  Line placement.  EXAM: PORTABLE CHEST - 1 VIEW  COMPARISON:  10/10/2013.  FINDINGS: PICC line noted with tip projected over superior vena cava.  Cardiac pacer noted with lead tips in stable position. Prior median sternotomy. Mitral valve replacement. Cardiomegaly. No CHF. Pulmonary vascularity is normal. Tiny left pleural effusion cannot be completely excluded. Right apical interstitial prominence most consistent with interstitial fibrosis. A component of underlying bronchiectasis may be present.  IMPRESSION: 1. PICC line with tip in good anatomic position. 2. Mitral valve replacement. Median sternotomy. Cardiac pacer. Cardiomegaly. No pulmonary venous congestion. 3. Small left pleural effusion cannot be excluded . 4. Right apical interstitial scarring. A component of bronchiectasis may be present.   Electronically Signed   By: Marcello Moores  Register   On: 10/15/2013 12:59    ASSESSMENT/PLAN:  79 year old female with prior mitral valve repair, previously described ejection fraction of 45% with cirrhosis of the liver, thrombocytopenia, abdominal discomfort, with new echocardiogram demonstrating possible ejection fraction of 35-40%.  1. Acute on chronic systolic/diastolic heart failure- Ejection fraction of 35-40% was a gross estimation however there did appear to be dysfunction present. Her mitral valve repair is intact on echo. Her creatinine bumped with IV Lasix and Lasix was held. BP much more stable today after starting IV dobutamine. Urine output has significantly picked up and she put out over 8.6L yesterday and weight is down 5lb since admit. Dig stopped due to renal insuff. No ACE I  due to renal failure. Appreciated HF service input. Will continue dobutamine and continue IV diuretics. Continue Coreg 3.125mg  BID  2. Cirrhosis with ascites. Her abdomen is distended and she has had diarrhea. Cirrhosis most likely due to CHF but need to consider GI etiology. Appreciatte GI input  3. Perirectal abcess s/p drainage  4. Severe MR S/P MV repair with Carpentier ring annuloplasty  5. PAF on chronic anticoagulation - coumadin reversed for surgery - restart on  Tuesday per surgery 6. CKD stage III - creatinine much improved with diuresis and Dobutamine  7. Place sequential compression devices for DVT prophylaxis  8. Hypokalemia - repleted 9.  Anemia      Sueanne Margarita, MD  10/17/2013  7:53 AM

## 2013-10-17 NOTE — Progress Notes (Signed)
Agree with A&P of MD,PA. Would remove foley tomorrow, as she needs to be able to void witout contaminating dressing. Shuld be ok to start Coumadin tomorrow if dressing remains free of actives bleeding

## 2013-10-17 NOTE — Progress Notes (Addendum)
[facility] PROGRESS NOTE  [name] [id number] DOB: [birth year] DOA: 10/10/2013 PCP: No primary provider on file.  Assessment/Plan:  Left labia, perirectal Abscess: Continue with  Vancomycin and Zosyn day 6. Surgery following.   WBC 8.7.  S/P I and D 11-28. Will follow sx recommendation regarding antibiotics.   Acute on chronic renal Failure: Last cr per records at 1.3. Cr on admission at 1.4.  -Cr peak to  to 2. 4. Renal function improved on dopamine. Stable on IV lasix. Cr at 1.3.   Acute on chronic systolic heart failure - 2D echo  shows further decrease in her EF to 35-40% and anteroseptal hypokinesis. Cardiology consulted, appreciate input.  -Continue with IV lasix, and Dobutamine.  -negative 8 L.   Acute blood loss anemia on Chronic anemia: post surgery. Monitor. Hb stable at 7. Change ferrous sulfate to BID.   Gout, left LE ; continue with colchicine.   S/p mitral valve annuloplasty   Atrial fibrillation -Continue to hold Coumadin. INR at 1.6. Resume coumadin when ok by surgery.   Status post pacemaker   Chronic anticoagulation -will defer to cardio continuation of anticoagulation.   Thrombocytopenia - due to infection process.  Resolved.   Urinary tract infection - Patient started on empiric ceftriaxone on admission. UA with 21 to 50 WBC. Change ceftriaxone to Zosyn due to # 1. Urine culture grew multiple bacterial morphotype.   Liver cirrhosis on imaging - patient without known history of any liver disease. She has ascites, elevated total bilirubin, thrombocytopenia.  viral hepatitis panel negative. GI consulted. Dr Percell Miller think cirrhosis could be due to NASH. No further work up at this point.   Diarrhea; Resolved. C diff negative.   Nausea/vomiting - due to probably acute illness. Resolved.   Code Status: DNR Family Communication: Care discussed with patient.  Disposition Plan: Remain step Down. Transfer to telemetry when off dopamine Gtt.     Consultants:  Surgery.  Cardiology  GI  Procedures:  none  Antibiotics:  Ceftriaxone 11-25---11-26  Zosyn 11-26  Vancomycin 11-26  HPI/Subjective: Complaining left feet pain. Breathing well. Feels abdominal distension has decreased.    Objective: Filed Vitals:   10/17/13 0755  BP: 150/48  Pulse: 70  Temp: 98.2 F (36.8 C)  Resp: 28    Intake/Output Summary (Last 24 hours) at 10/17/13 0927 Last data filed at 10/17/13 0800  Gross per 24 hour  Intake 277.06 ml  Output   2200 ml  Net -1922.94 ml   Filed Weights   10/15/13 0500 10/16/13 0500 10/17/13 0539  Weight: 105.9 kg (233 lb 7.5 oz) 106.2 kg (234 lb 2.1 oz) 107.1 kg (236 lb 1.8 oz)    Exam:   General:  No distress  Cardiovascular: S 1, S 2 RRR  Respiratory: bilateral air movement, no significant crackles.   Abdomen: Bs present, soft, NT, distended.   Musculoskeletal: trace  edema.    Data Reviewed: Basic Metabolic Panel:  Recent Labs Lab 10/11/13 1149  10/13/13 0720 10/14/13 0905 10/15/13 0604 10/16/13 0500 10/17/13 0500  NA 139  < > 134* 136 138 142 143  K 3.8  < > 3.5 3.4* 3.6 3.1* 3.8  CL 104  < > 99 101 104 105 105  CO2 22  < > 20 20 20 24 26   GLUCOSE 133*  < > 103* 96 88 108* 102*  BUN 35*  < > 55* 51* 46* 34* 25*  CREATININE 1.90*  < > 2.47* 1.88* 1.61* 1.28* 1.33*  CALCIUM 8.0*  < > 8.0* 8.2* 7.7* 7.3* 7.2*  MG 2.1  --   --   --   --   --   --   < > = values in this interval not displayed. Liver Function Tests:  Recent Labs Lab 10/10/13 1204  AST 12  ALT 7  ALKPHOS 108  BILITOT 2.1*  PROT 6.1  ALBUMIN 3.1*   No results found for this basename: LIPASE, AMYLASE,  in the last 168 hours No results found for this basename: AMMONIA,  in the last 168 hours CBC:  Recent Labs Lab 10/14/13 0905 10/14/13 1615 10/15/13 0604 10/16/13 1044 10/17/13 0500  WBC 9.9 10.2 9.7 8.1 8.7  HGB 9.6* 8.7* 7.8* 7.2* 7.2*  HCT 28.8* 26.1* 24.3* 22.1* 22.2*  MCV 84.2 84.2  85.6 85.3 85.7  PLT 171 192 193 193 181   Cardiac Enzymes:  Recent Labs Lab 10/10/13 1843 10/10/13 2255 10/11/13 0510  TROPONINI <0.30 <0.30 <0.30   BNP (last 3 results)  Recent Labs  10/10/13 1634 10/13/13 1113  PROBNP 6217.0* 8553.0*   CBG: No results found for this basename: GLUCAP,  in the last 168 hours  Recent Results (from the past 240 hour(s))  URINE CULTURE     Status: None   Collection Time    10/10/13  2:32 PM      Result Value Range Status   Specimen Description URINE, RANDOM   Final   Special Requests NONE   Final   Culture  Setup Time     Final   Value: 10/10/2013 20:40     Performed at [place]     Final   Value: >=100,000 COLONIES/ML     Performed at [facility]   Culture     Final   Value: Multiple bacterial morphotypes present, none predominant. Suggest appropriate recollection if clinically indicated.     Performed at [facility]   Report Status 10/12/2013 FINAL   Final  MRSA PCR SCREENING     Status: None   Collection Time    10/10/13  6:24 PM      Result Value Range Status   MRSA by PCR NEGATIVE  NEGATIVE Final   Comment:            The GeneXpert MRSA Assay (FDA     approved for NASAL specimens     only), is one component of a     comprehensive MRSA colonization     surveillance program. It is not     intended to diagnose MRSA     infection nor to guide or     monitor treatment for     MRSA infections.  CLOSTRIDIUM DIFFICILE BY PCR     Status: None   Collection Time    10/11/13  8:38 PM      Result Value Range Status   C difficile by pcr NEGATIVE  NEGATIVE Final  URINE CULTURE     Status: None   Collection Time    10/14/13  8:47 AM      Result Value Range Status   Specimen Description URINE, CLEAN CATCH   Final   Special Requests A   Final   Culture  Setup Time     Final   Value: 10/14/2013 10:02     Performed at [facility]     Final   Value: NO GROWTH      Performed at  [facility]     Final   Value: NO GROWTH     Performed at [facility]   Report Status 10/15/2013 FINAL   Final  ANAEROBIC CULTURE     Status: None   Collection Time    10/14/13 11:41 AM      Result Value Range Status   Specimen Description ABSCESS   Final   Special Requests PERINEAL ABSCESS   Final   Gram Stain     Final   Value: NO WBC SEEN     NOPEPI NO ORGANISMS SEEN     Performed at [facility]   Culture     Final   Value: NO ANAEROBES ISOLATED; CULTURE IN PROGRESS FOR 5 DAYS     Performed at [facility]   Report Status PENDING   Incomplete  CULTURE, ROUTINE-ABSCESS     Status: None   Collection Time    10/14/13 11:41 AM      Result Value Range Status   Specimen Description ABSCESS   Final   Special Requests PERINEAL ABSCESS   Final   Gram Stain     Final   Value: NO WBC SEEN     NOPEI NO ORGANISMS SEEN     Performed at [facility]   Culture     Final   Value: NO GROWTH 2 DAYS     Performed at [facility]   Report Status PENDING   Incomplete     Studies: Dg Chest Port 1 View  10/15/2013   CLINICAL DATA:  Line placement.  EXAM: PORTABLE CHEST - 1 VIEW  COMPARISON:  10/10/2013.  FINDINGS: PICC line noted with tip projected over superior vena cava. Cardiac pacer noted with lead tips in stable position. Prior median sternotomy. Mitral valve replacement. Cardiomegaly. No CHF. Pulmonary vascularity is normal. Tiny left pleural effusion cannot be completely excluded. Right apical interstitial prominence most consistent with interstitial fibrosis. A component of underlying bronchiectasis may be present.  IMPRESSION: 1. PICC line with tip in good anatomic position. 2. Mitral valve replacement. Median sternotomy. Cardiac pacer. Cardiomegaly. No pulmonary venous congestion. 3. Small left pleural effusion cannot be excluded . 4. Right apical interstitial scarring. A component of bronchiectasis may be present.    Electronically Signed   By: Marcello Moores  Register   On: 10/15/2013 12:59    Scheduled Meds: . beta carotene w/minerals  1 tablet Oral Daily  . colchicine  0.6 mg Oral BID  . ferrous sulfate  325 mg Oral Q breakfast  . furosemide  40 mg Intravenous Q8H  . loratadine  10 mg Oral Daily  . piperacillin-tazobactam (ZOSYN)  IV  3.375 g Intravenous Q8H  . potassium chloride  20 mEq Oral Daily  . sodium chloride  10-40 mL Intracatheter Q12H  . sodium chloride  3 mL Intravenous Q12H  . vancomycin  1,500 mg Intravenous Q48H   Continuous Infusions: . DOBUTamine 2.5 mcg/kg/min (10/17/13 0800)    Principal Problem:   Perirectal abscess Active Problems:   Chronic anticoagulation   Liver cirrhosis   Thrombocytopenia   Nausea and vomiting   Acute on chronic combined systolic and diastolic congestive heart failure   UTI (urinary tract infection)   CKD (chronic kidney disease) stage 3, GFR 30-59 ml/min   Anemia   PAF (paroxysmal atrial fibrillation)   Biventricular cardiac pacemaker in situ    Time spent: 30 minutes.     Duward Allbritton  [facility] Pager [phone].  If 7PM-7AM, please contact night-coverage at [email or web address], password [facility] 10/17/2013, 9:27 AM  LOS: 7 days    Daughter concern regarding disposition. She is not prepare to provide her mother  care.

## 2013-10-17 NOTE — Progress Notes (Addendum)
Physical Therapy Treatment Patient Details Name: [name] MRN: [id number] DOB: 1925/12/15 Today's Date: 10/17/2013 Time: [id number] PT Time Calculation (min): 27 min  PT Assessment / Plan / Recommendation  History of Present Illness 79 year old female with prior mitral valve repair, previously described ejection fraction of 45% with cirrhosis of the liver, thrombocytopenia, abdominal discomfort, with new echocardiogram demonstrating possible ejection fraction of 35-40%.  Cirrhosis with ascites. Her abdomen is distended and she has had diarrhea. Cirrhosis most likely due to CHF but need to consider GI etiology.  Pt also with Perirectal abcess requiring drainage on 10/14/13.   PT Comments   Pt with limited activity and standing tolerance today due to pain 10/10 left foot. RN and MD made aware of pt gout flare severely impacting mobility at this time. Pt educated for transfers and HEP and encouraged to continue OOB and HEP daily. Will continue to follow to maximize independence. Based on limited mobility today family will not be able to care for pt and feel ST-SNF would be the best option unless pt able to achieve supervision level for return home.   Follow Up Recommendations  SNF;Supervision for mobility/OOB     Does the patient have the potential to tolerate intense rehabilitation     Barriers to Discharge        Equipment Recommendations       Recommendations for Other Services    Frequency     Progress towards PT Goals Progress towards PT goals: Not progressing toward goals - comment (limited due to left foot painful)  Plan Discharge plan needs to be updated    Precautions / Restrictions Precautions Precautions: Fall Precaution Comments: gout left foot   Pertinent Vitals/Pain 133/51 sitting    Mobility  Bed Mobility Bed Mobility: Supine to Sit;Sitting - Scoot to Edge of Bed Supine to Sit: 5: Supervision;With rails;HOB flat Sitting - Scoot to Edge of Bed: 6: Modified  independent (Device/Increase time) Details for Bed Mobility Assistance: increased time with cues for use of rail to ease transition Transfers Transfers: Squat Pivot Transfers Sit to Stand: 4: Min assist;From bed Stand to Sit: 4: Min assist;To bed Squat Pivot Transfers: 3: Mod assist Details for Transfer Assistance: pt stood x 3 from bed with min assist and cueing for sequence and anterior weight shift onto right foot,unable to tolerate weight bearing on left foot on 3rd stand able to pivot to chair with cueing for sequence and assist to rotate hips to chair Ambulation/Gait Ambulation/Gait Assistance: Not tested (comment)    Exercises General Exercises - Lower Extremity Long Arc Quad: AROM;Seated;Both;15 reps Hip ABduction/ADduction: AROM;Right;15 reps Hip Flexion/Marching: AROM;Seated;Both;15 reps   PT Diagnosis:    PT Problem List:   PT Treatment Interventions:     PT Goals (current goals can now be found in the care plan section)    Visit Information  Last PT Received On: 10/17/13 Assistance Needed: +1 History of Present Illness: 79 year old female with prior mitral valve repair, previously described ejection fraction of 45% with cirrhosis of the liver, thrombocytopenia, abdominal discomfort, with new echocardiogram demonstrating possible ejection fraction of 35-40%.  Cirrhosis with ascites. Her abdomen is distended and she has had diarrhea. Cirrhosis most likely due to CHF but need to consider GI etiology.  Pt also with Perirectal abcess requiring drainage on 10/14/13.    Subjective Data      Cognition  Cognition Arousal/Alertness: Awake/alert Behavior During Therapy: WFL for tasks assessed/performed Overall Cognitive Status: Within Functional Limits for tasks assessed  Balance  Static Sitting Balance Static Sitting - Balance Support: No upper extremity supported;Feet supported Static Sitting - Level of Assistance: 6: Modified independent (Device/Increase time) Static  Sitting - Comment/# of Minutes: 4  End of Session PT - End of Session Equipment Utilized During Treatment: Gait belt Activity Tolerance: Patient limited by pain Patient left: in chair;with nursing/sitter in room;with call bell/phone within reach Nurse Communication: Mobility status;Other (comment) (request for cholchicine and transfer technique)   GP     Lanetta Inch Beth 10/17/2013, 12:08 PM Elwyn Reach, [place]

## 2013-10-17 NOTE — Care Management Note (Signed)
    Page 1 of 1   10/17/2013     2:17:04 PM   CARE MANAGEMENT NOTE 10/17/2013  Patient:  [name], [name]   Account Number:  [id number]  Date Initiated:  10/14/2013  Documentation initiated by:  Elissa Hefty  Subjective/Objective Assessment:   adm w  abscess     Action/Plan:   lives w fam   Per UR Regulation:  Reviewed for med. necessity/level of care/duration of stay  Comments:  10/17/13 [place] MSN BSN CCM Received referral for home health RN, met with dtr and pt @ bedside.  Per dtr, Santiago Glad [phone]), she is primary caregiver and has young children, will not be able to provide sufficient assistance for safe discharge home. Requests referral to social work for [facility] for rehab and dsg changes.  CSW notified.

## 2013-10-17 NOTE — Progress Notes (Signed)
3 Days Post-Op  Subjective: Pt doing well.  No significant pain with dressing changes.  No other complaints.  Nurses note frequent stooling and thus frequent packing changes are needed.  Objective: Vital signs in last 24 hours: Temp:  [97.9 F (36.6 C)-98 F (36.7 C)] 98 F (36.7 C) (12/01 0359) Pulse Rate:  [70-73] 70 (12/01 0605) Resp:  [22-26] 25 (12/01 0359) BP: (99-138)/(42-67) 135/47 mmHg (12/01 0605) SpO2:  [93 %-98 %] 95 % (12/01 0359) Weight:  [236 lb 1.8 oz (107.1 kg)] 236 lb 1.8 oz (107.1 kg) (12/01 0539) Last BM Date: 10/16/13  Intake/Output from previous day: 11/30 0701 - 12/01 0700 In: 121.1 [I.V.:71.1; IV Piggyback:50] Out: 2700 [Urine:2700] Intake/Output this shift:    PE: Gen:  Alert, NAD, pleasant Perineum: Wound appears clean, beefy red, packing was some with serosanguinous drainage, but no active bleeding noted in the wound bed, no significant slough/necrosis/infection; no foul smell; peri-wound less erythema and induration, and no areas of fluctuance   Lab Results:   Recent Labs  10/15/13 0604 10/16/13 1044  WBC 9.7 8.1  HGB 7.8* 7.2*  HCT 24.3* 22.1*  PLT 193 193   BMET  Recent Labs  10/16/13 0500 10/17/13 0500  NA 142 143  K 3.1* 3.8  CL 105 105  CO2 24 26  GLUCOSE 108* 102*  BUN 34* 25*  CREATININE 1.28* 1.33*  CALCIUM 7.3* 7.2*   PT/INR  Recent Labs  10/16/13 0500 10/17/13 0500  LABPROT 17.5* 19.2*  INR 1.48 1.67*   CMP     Component Value Date/Time   NA 143 10/17/2013 0500   K 3.8 10/17/2013 0500   CL 105 10/17/2013 0500   CO2 26 10/17/2013 0500   GLUCOSE 102* 10/17/2013 0500   BUN 25* 10/17/2013 0500   CREATININE 1.33* 10/17/2013 0500   CALCIUM 7.2* 10/17/2013 0500   PROT 6.1 10/10/2013 1204   ALBUMIN 3.1* 10/10/2013 1204   AST 12 10/10/2013 1204   ALT 7 10/10/2013 1204   ALKPHOS 108 10/10/2013 1204   BILITOT 2.1* 10/10/2013 1204   GFRNONAA 35* 10/17/2013 0500   GFRAA 41* 10/17/2013 0500   Lipase  No results found  for this basename: lipase       Studies/Results: Dg Chest Port 1 View  10/15/2013   CLINICAL DATA:  Line placement.  EXAM: PORTABLE CHEST - 1 VIEW  COMPARISON:  10/10/2013.  FINDINGS: PICC line noted with tip projected over superior vena cava. Cardiac pacer noted with lead tips in stable position. Prior median sternotomy. Mitral valve replacement. Cardiomegaly. No CHF. Pulmonary vascularity is normal. Tiny left pleural effusion cannot be completely excluded. Right apical interstitial prominence most consistent with interstitial fibrosis. A component of underlying bronchiectasis may be present.  IMPRESSION: 1. PICC line with tip in good anatomic position. 2. Mitral valve replacement. Median sternotomy. Cardiac pacer. Cardiomegaly. No pulmonary venous congestion. 3. Small left pleural effusion cannot be excluded . 4. Right apical interstitial scarring. A component of bronchiectasis may be present.   Electronically Signed   By: Marcello Moores  Register   On: 10/15/2013 12:59    Anti-infectives: Anti-infectives   Start     Dose/Rate Route Frequency Ordered Stop   10/15/13 2000  piperacillin-tazobactam (ZOSYN) IVPB 3.375 g     3.375 g 12.5 mL/hr over 240 Minutes Intravenous 3 times per day 10/15/13 1537     10/15/13 1645  vancomycin (VANCOCIN) 1,500 mg in sodium chloride 0.9 % 500 mL IVPB     1,500 mg 250  mL/hr over 120 Minutes Intravenous Every 48 hours 10/15/13 1543     10/14/13 1300  vancomycin (VANCOCIN) 1,500 mg in sodium chloride 0.9 % 500 mL IVPB  Status:  Discontinued     1,500 mg 250 mL/hr over 120 Minutes Intravenous Every 48 hours 10/12/13 1131 10/16/13 1607   10/12/13 1300  vancomycin (VANCOCIN) 2,000 mg in sodium chloride 0.9 % 500 mL IVPB     2,000 mg 250 mL/hr over 120 Minutes Intravenous  Once 10/12/13 1131 10/12/13 1500   10/12/13 1230  piperacillin-tazobactam (ZOSYN) IVPB 2.25 g  Status:  Discontinued     2.25 g 100 mL/hr over 30 Minutes Intravenous Every 6 hours 10/12/13 1131  10/15/13 1537   10/11/13 0600  cefTRIAXone (ROCEPHIN) 1 g in dextrose 5 % 50 mL IVPB  Status:  Discontinued     1 g 100 mL/hr over 30 Minutes Intravenous Every 24 hours 10/10/13 1828 10/12/13 1021   10/10/13 1530  cefTRIAXone (ROCEPHIN) 1 g in dextrose 5 % 50 mL IVPB     1 g 100 mL/hr over 30 Minutes Intravenous  Once 10/10/13 1529 10/10/13 1635       Assessment/Plan 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 Continue BID packing changes, tolerated bedside dressing change well  2. Cont vanc/zosyn, WBC normal  3. Monitor H/H given blood loss during surgery  4. No need for additional debridements at this time, WBC is normal, and tolerating dressing changes. Okay to d/c home with [facility] wound care when medically stable per primary team.  5. F/u with Donne Hazel in 2-3 weeks, office should call with appt time  6. Cautious about restarting coumadin, given the amount of blood loss during surgery. Will evaluate wound today to see if coumadin can be restarted 7.  Will discuss with Dr. Margot Chimes the timing of foley removal given infection site    LOS: 7 days    DORT, [facility] 10/17/2013, 7:25 AM Pager: [phone]

## 2013-10-17 NOTE — Progress Notes (Signed)
Clinical Social Work Department BRIEF PSYCHOSOCIAL ASSESSMENT 10/17/2013  Patient:  [name], [name]     Account Number:  [id number]     Admit date:  10/10/2013  Clinical Social Worker:  Freeman Caldron  Date/Time:  10/17/2013 04:08 PM  Referred by:  Physician  Date Referred:  10/17/2013 Referred for  SNF Placement   Other Referral:   Interview type:  Patient Other interview type:    PSYCHOSOCIAL DATA Living Status:  FAMILY Admitted from facility:   Level of care:   Primary support name:  Mont Dutton [phone] & [phone]) Primary support relationship to patient:  CHILD, ADULT Degree of support available:   Good--pt was living with her daughter Santiago Glad before hospital admission.    CURRENT CONCERNS Current Concerns  Post-Acute Placement   Other Concerns:    SOCIAL WORK ASSESSMENT / PLAN CSW introduced self and asked pt where she was living before hospital admission. Pt stated she lived with her daughter Santiago Glad before hospital admission. CSW explained medical team believes pt could benefit from SNF for short-term rehab before returning home. CSW left list of SNFs in [facility] and asked if CSW can send clinicals to all SNFs in [place]. CSW sent clinicals. Pt also requested CSW call her daughter Santiago Glad. CSW called Santiago Glad and explained SNF search process. CSW will call Santiago Glad tomorrow after checking for bed offers.   Assessment/plan status:  Psychosocial Support/Ongoing Assessment of Needs Other assessment/ plan:   Information/referral to community resources:   SNF    PATIENT'S/FAMILY'S RESPONSE TO PLAN OF CARE: Pt was receptive to CSW visit and engaged in conversation but expressed that the medication she is on makes her very tired and makes it hard for her to think. CSW provided support, as pt became visibly flustered and was having a difficult time completing sentences and was attributing this to her medicine. Pt's daughter Santiago Glad engaged in conversation  with CSW and also expressed gratitude for CSW assisstance.       Ky Barban, MSW, [facility] Clinical Social Worker [phone]

## 2013-10-18 ENCOUNTER — Encounter (HOSPITAL_COMMUNITY): Payer: Self-pay | Admitting: General Surgery

## 2013-10-18 DIAGNOSIS — K7460 Unspecified cirrhosis of liver: Secondary | ICD-10-CM

## 2013-10-18 LAB — CBC
HCT: 22.6 % — ABNORMAL LOW (ref 36.0–46.0)
MCHC: 31.4 g/dL (ref 30.0–36.0)
MCV: 86.9 fL (ref 78.0–100.0)
Platelets: 189 10*3/uL (ref 150–400)
RBC: 2.6 MIL/uL — ABNORMAL LOW (ref 3.87–5.11)
WBC: 10.3 10*3/uL (ref 4.0–10.5)

## 2013-10-18 LAB — CULTURE, ROUTINE-ABSCESS
Culture: NO GROWTH
Gram Stain: NONE SEEN

## 2013-10-18 LAB — BASIC METABOLIC PANEL
CO2: 27 mEq/L (ref 19–32)
Calcium: 7 mg/dL — ABNORMAL LOW (ref 8.4–10.5)
Chloride: 102 mEq/L (ref 96–112)
Creatinine, Ser: 1.31 mg/dL — ABNORMAL HIGH (ref 0.50–1.10)
Glucose, Bld: 92 mg/dL (ref 70–99)

## 2013-10-18 LAB — CARBOXYHEMOGLOBIN: Methemoglobin: 1.6 % — ABNORMAL HIGH (ref 0.0–1.5)

## 2013-10-18 LAB — PROTIME-INR: INR: 1.7 — ABNORMAL HIGH (ref 0.00–1.49)

## 2013-10-18 MED ORDER — POTASSIUM CHLORIDE CRYS ER 20 MEQ PO TBCR
40.0000 meq | EXTENDED_RELEASE_TABLET | Freq: Once | ORAL | Status: AC
  Administered 2013-10-18: 40 meq via ORAL
  Filled 2013-10-18: qty 2

## 2013-10-18 MED ORDER — FUROSEMIDE 10 MG/ML IJ SOLN
40.0000 mg | Freq: Once | INTRAMUSCULAR | Status: AC
  Administered 2013-10-18: 40 mg via INTRAVENOUS
  Filled 2013-10-18: qty 4

## 2013-10-18 MED ORDER — FUROSEMIDE 10 MG/ML IJ SOLN
80.0000 mg | Freq: Three times a day (TID) | INTRAMUSCULAR | Status: DC
  Administered 2013-10-18 – 2013-10-19 (×3): 80 mg via INTRAVENOUS
  Filled 2013-10-18 (×5): qty 8

## 2013-10-18 NOTE — Progress Notes (Signed)
Patient ID: [name], female   DOB: February 21, 1926, 79 y.o.   MRN: [id number] Advanced Heart Failure Rounding Note  SUBJECTIVE:  Gout pain better.  Diuresis ongoing but not particularly vigorous.  CVP 17 this morning. ? Accuracy of weights (done in bed). Renal fxn stable.    . beta carotene w/minerals  1 tablet Oral Daily  . colchicine  0.6 mg Oral BID  . ferrous sulfate  325 mg Oral BID WC  . furosemide  80 mg Intravenous Q8H  . loratadine  10 mg Oral Daily  . piperacillin-tazobactam (ZOSYN)  IV  3.375 g Intravenous Q8H  . potassium chloride  20 mEq Oral Daily  . sodium chloride  10-40 mL Intracatheter Q12H  . sodium chloride  3 mL Intravenous Q12H  . vancomycin  1,500 mg Intravenous Q24H  dobutamine @ 2.5    Filed Vitals:   10/17/13 2048 10/18/13 0000 10/18/13 0400 10/18/13 0500  BP: 94/45 122/47 135/42   Pulse: 70 70 70   Temp: 98.2 F (36.8 C) 99.4 F (37.4 C) 98.9 F (37.2 C)   TempSrc: Oral     Resp: 24 21 23    Height:      Weight:    228 lb 2.8 oz (103.5 kg)  SpO2: 97% 94% 94%     Intake/Output Summary (Last 24 hours) at 10/18/13 0742 Last data filed at 10/18/13 0545  Gross per 24 hour  Intake   1033 ml  Output   2650 ml  Net  -1617 ml    LABS: Basic Metabolic Panel:  Recent Labs  10/17/13 0500 10/18/13 0500  NA 143 141  K 3.8 3.5  CL 105 102  CO2 26 27  GLUCOSE 102* 92  BUN 25* 19  CREATININE 1.33* 1.31*  CALCIUM 7.2* 7.0*   Liver Function Tests: No results found for this basename: AST, ALT, ALKPHOS, BILITOT, PROT, ALBUMIN,  in the last 72 hours No results found for this basename: LIPASE, AMYLASE,  in the last 72 hours CBC:  Recent Labs  10/17/13 0500 10/18/13 0500  WBC 8.7 10.3  HGB 7.2* 7.1*  HCT 22.2* 22.6*  MCV 85.7 86.9  PLT 181 189   Cardiac Enzymes: No results found for this basename: CKTOTAL, CKMB, CKMBINDEX, TROPONINI,  in the last 72 hours BNP: No components found with this basename: POCBNP,  D-Dimer: No results found  for this basename: DDIMER,  in the last 72 hours Hemoglobin A1C: No results found for this basename: HGBA1C,  in the last 72 hours Fasting Lipid Panel: No results found for this basename: CHOL, HDL, LDLCALC, TRIG, CHOLHDL, LDLDIRECT,  in the last 72 hours Thyroid Function Tests: No results found for this basename: TSH, T4TOTAL, FREET3, T3FREE, THYROIDAB,  in the last 72 hours Anemia Panel: No results found for this basename: VITAMINB12, FOLATE, FERRITIN, TIBC, IRON, RETICCTPCT,  in the last 72 hours  RADIOLOGY: Ct Abdomen Pelvis Wo Contrast  10/10/2013   CLINICAL DATA:  Vomiting and diarrhea for 3-4 days, generalized weakness, on Coumadin, history is CHF  EXAM: CT ABDOMEN AND PELVIS WITHOUT CONTRAST  TECHNIQUE: Multidetector CT imaging of the abdomen and pelvis was performed following the standard protocol without intravenous contrast.  COMPARISON:  Lumbar spine CT -06/15/2009  FINDINGS: The lack of intravenous contrast limits the ability to evaluate solid abdominal organs.  There is mild nodularity of the hepatic contour, nonspecific though could be seen in the setting of cirrhosis. This finding is associated with small volume of intra-abdominal ascites. Post  cholecystectomy. There is apparent fusiform ectasia of the origin of the portal vein regional to the confluence of the superior mesenteric and splenic veins measuring approximately 2.8 cm in greatest oblique axial dimension (image 31, series 2).  The bilateral kidneys are slightly atrophic compatible with provided history of renal insufficiency. No discrete renal stones. No urinary obstruction or perinephric stranding. Normal noncontrast appearance of the bilateral adrenal glands. There is a crescentic approximately 1.6 x 1.4 cm presumed partially calcified splenic artery aneurysm (image 26, series 2). Additional smaller presumed splenic aneurysms are seen about the splenic hilum.  Scattered colonic diverticulosis without evidence of  diverticulitis. The colon is largely decompressed. The bowel is otherwise normal in course and caliber without discrete area of wall thickening or evidence of obstruction. The appendix is not definitely identified, however there is no definitive inflammatory change within the right lower abdominal quadrant. No pneumoperitoneum, pneumatosis or portal venous gas.  Atherosclerotic plaque within a normal caliber abdominal aorta. Scattered shotty retroperitoneal lymph nodes are individually not enlarged by size criteria. No retroperitoneal, mesenteric, pelvic or inguinal lymphadenopathy on this noncontrast examination.  The pelvic organs her expectantly atrophic. No discrete adnexal lesion.  Limited visualization of the lower thorax demonstrates minimal subsegmental atelectasis within the caudal aspect of the right middle lobe in an left lower lobe. No focal airspace opacities. No pleural effusion.  Cardiomegaly. Ventricular pacer leads are seen within the right atrium, ventricle and coronary sinus. No pericardial effusion.  No acute or aggressive osseous abnormalities. There is mild straightening expected lumbar lordosis. Grossly unchanged mild (approximately 25%) compression deformity involving the anterior aspect of the superior endplate of the L4 vertebral body. Grossly unchanged mild (approximately 5 mm) of retrolisthesis of L4 upon L5.  IMPRESSION: 1. Colonic diverticulosis without definite evidence of diverticulitis on this noncontrast examination. The colon is decompressed without definite evidence of obstruction. 2. Mild nodularity of the hepatic contour suggestive of cirrhosis. This finding is associated with small volume intra-abdominal ascites. 3. Incidental note made of an approximately 1.6 cm splenic artery aneurysm - this is of doubtful clinical concern in this postmenopausal patient. 4. Suspected fusiform ectasia of the origin of the portal vein, measuring approximately 1.8 cm, incompletely evaluated on  this noncontrast examination and of uncertain clinical significant. 5. Cardiomegaly.   Electronically Signed   By: Sandi Mariscal M.D.   On: 10/10/2013 15:48   Dg Chest 2 View  10/10/2013   CLINICAL DATA:  Cough.  EXAM: CHEST  2 VIEW  COMPARISON:  November 29, 2007.  FINDINGS: Stable cardiomegaly. Status post mitral valve replacement. Left-sided pacemaker is unchanged in position. No pneumothorax or pleural effusion is noted. No acute pulmonary disease is noted. Bony thorax is intact.  IMPRESSION: No acute cardiopulmonary abnormality seen.   Electronically Signed   By: Sabino Dick M.D.   On: 10/10/2013 17:04   Dg Chest Port 1 View  10/15/2013   CLINICAL DATA:  Line placement.  EXAM: PORTABLE CHEST - 1 VIEW  COMPARISON:  10/10/2013.  FINDINGS: PICC line noted with tip projected over superior vena cava. Cardiac pacer noted with lead tips in stable position. Prior median sternotomy. Mitral valve replacement. Cardiomegaly. No CHF. Pulmonary vascularity is normal. Tiny left pleural effusion cannot be completely excluded. Right apical interstitial prominence most consistent with interstitial fibrosis. A component of underlying bronchiectasis may be present.  IMPRESSION: 1. PICC line with tip in good anatomic position. 2. Mitral valve replacement. Median sternotomy. Cardiac pacer. Cardiomegaly. No pulmonary venous congestion. 3. Small  left pleural effusion cannot be excluded . 4. Right apical interstitial scarring. A component of bronchiectasis may be present.   Electronically Signed   By: Marcello Moores  Register   On: 10/15/2013 12:59    PHYSICAL EXAM General: NAD Neck: JVP 12-14 cm, no thyromegaly or thyroid nodule.  Lungs: Slight crackles at bases. CV: Nondisplaced PMI.  Heart regular S1/S2, no S3/S4, 2/6 SEM.  1+ ankle edema.   Abdomen: Soft, nontender, no hepatosplenomegaly, no distention.  Neurologic: Alert and oriented x 3.  Psych: Normal affect. Extremities: No clubbing or cyanosis.   TELEMETRY: Reviewed  telemetry pt in afib with pacing  ASSESSMENT AND PLAN: 1. 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 She remains volume overloaded on exam. BP and creatinine stable.  CVP 17 and JVP still up.   - Increase Lasix to 80 mg IV every 8 hrs today - Continue dobutamine @ 2.5.   - Follow CVP   - EF seems to be lower than past but echo difficult. Had no significant CAD on cath about 10 years ago. Would hold off on LHC for now with AKI.  - Hold off on beta blocker given dobutamine use, no ACEI with AKI.  2. AKI on CKD: Creatinine improved on dobutamine.  3. Atrial fibrillation: Chronic. Paced rhythm, rate stable despite inotropes. Start warfarin when stable from surgical standpoint (hold off for now with falling hemoglobin). 4. Cirrhosis: Possibly due to right heart failure/hepatic congestion. Only mild amount of ascites noted on CT.  5. Peri-rectal abscess: s/p surgery. 6. MV repair: Stable without significant MR on last echo.  7. Gout pain: Better on colchicine.   8. Anemia: Post-op.  Hemoglobin 7.1 today.  Transfuse hemoglobin < 7.   [name] 10/18/2013 7:42 AM

## 2013-10-18 NOTE — Progress Notes (Addendum)
SUBJECTIVE:  Feels better but gout pain still present.  OBJECTIVE:   Vitals:   Filed Vitals:   10/18/13 0000 10/18/13 0400 10/18/13 0500 10/18/13 0800  BP: 122/47 135/42  127/92  Pulse: 70 70  71  Temp: 99.4 F (37.4 C) 98.9 F (37.2 C)  98.1 F (36.7 C)  TempSrc:    Oral  Resp: 21 23  21   Height:      Weight:   228 lb 2.8 oz (103.5 kg) 226 lb 13.7 oz (102.9 kg)  SpO2: 94% 94%  98%   I&O's:   Intake/Output Summary (Last 24 hours) at 10/18/13 0857 Last data filed at 10/18/13 0545  Gross per 24 hour  Intake    977 ml  Output   2350 ml  Net  -1373 ml   TELEMETRY: Reviewed telemetry pt in Vpaced:     PHYSICAL EXAM General: Well developed, well nourished, in no acute distress Head: Eyes PERRLA, No xanthomas.   Normal cephalic and atramatic  Lungs:   Crackles at bases Heart:   HRRR S1 S2 Pulses are 2+ & equal. Abdomen: Bowel sounds are positive, abdomen soft and non-tender without masses Extremities:   1+ edema Neuro: Alert and oriented X 3. Psych:  Good affect, responds appropriately   LABS: Basic Metabolic Panel:  Recent Labs  10/17/13 0500 10/18/13 0500  NA 143 141  K 3.8 3.5  CL 105 102  CO2 26 27  GLUCOSE 102* 92  BUN 25* 19  CREATININE 1.33* 1.31*  CALCIUM 7.2* 7.0*   Liver Function Tests: No results found for this basename: AST, ALT, ALKPHOS, BILITOT, PROT, ALBUMIN,  in the last 72 hours No results found for this basename: LIPASE, AMYLASE,  in the last 72 hours CBC:  Recent Labs  10/17/13 0500 10/18/13 0500  WBC 8.7 10.3  HGB 7.2* 7.1*  HCT 22.2* 22.6*  MCV 85.7 86.9  PLT 181 189   Cardiac Enzymes: No results found for this basename: CKTOTAL, CKMB, CKMBINDEX, TROPONINI,  in the last 72 hours BNP: No components found with this basename: POCBNP,  D-Dimer: No results found for this basename: DDIMER,  in the last 72 hours Hemoglobin A1C: No results found for this basename: HGBA1C,  in the last 72 hours Fasting Lipid Panel: No results  found for this basename: CHOL, HDL, LDLCALC, TRIG, CHOLHDL, LDLDIRECT,  in the last 72 hours Thyroid Function Tests: No results found for this basename: TSH, T4TOTAL, FREET3, T3FREE, THYROIDAB,  in the last 72 hours Anemia Panel: No results found for this basename: VITAMINB12, FOLATE, FERRITIN, TIBC, IRON, RETICCTPCT,  in the last 72 hours Coag Panel:   Lab Results  Component Value Date   INR 1.70* 10/18/2013   INR 1.67* 10/17/2013   INR 1.48 10/16/2013    RADIOLOGY: Ct Abdomen Pelvis Wo Contrast  10/10/2013   CLINICAL DATA:  Vomiting and diarrhea for 3-4 days, generalized weakness, on Coumadin, history is CHF  EXAM: CT ABDOMEN AND PELVIS WITHOUT CONTRAST  TECHNIQUE: Multidetector CT imaging of the abdomen and pelvis was performed following the standard protocol without intravenous contrast.  COMPARISON:  Lumbar spine CT -06/15/2009  FINDINGS: The lack of intravenous contrast limits the ability to evaluate solid abdominal organs.  There is mild nodularity of the hepatic contour, nonspecific though could be seen in the setting of cirrhosis. This finding is associated with small volume of intra-abdominal ascites. Post cholecystectomy. There is apparent fusiform ectasia of the origin of the portal vein regional to the confluence of  the superior mesenteric and splenic veins measuring approximately 2.8 cm in greatest oblique axial dimension (image 31, series 2).  The bilateral kidneys are slightly atrophic compatible with provided history of renal insufficiency. No discrete renal stones. No urinary obstruction or perinephric stranding. Normal noncontrast appearance of the bilateral adrenal glands. There is a crescentic approximately 1.6 x 1.4 cm presumed partially calcified splenic artery aneurysm (image 26, series 2). Additional smaller presumed splenic aneurysms are seen about the splenic hilum.  Scattered colonic diverticulosis without evidence of diverticulitis. The colon is largely decompressed. The  bowel is otherwise normal in course and caliber without discrete area of wall thickening or evidence of obstruction. The appendix is not definitely identified, however there is no definitive inflammatory change within the right lower abdominal quadrant. No pneumoperitoneum, pneumatosis or portal venous gas.  Atherosclerotic plaque within a normal caliber abdominal aorta. Scattered shotty retroperitoneal lymph nodes are individually not enlarged by size criteria. No retroperitoneal, mesenteric, pelvic or inguinal lymphadenopathy on this noncontrast examination.  The pelvic organs her expectantly atrophic. No discrete adnexal lesion.  Limited visualization of the lower thorax demonstrates minimal subsegmental atelectasis within the caudal aspect of the right middle lobe in an left lower lobe. No focal airspace opacities. No pleural effusion.  Cardiomegaly. Ventricular pacer leads are seen within the right atrium, ventricle and coronary sinus. No pericardial effusion.  No acute or aggressive osseous abnormalities. There is mild straightening expected lumbar lordosis. Grossly unchanged mild (approximately 25%) compression deformity involving the anterior aspect of the superior endplate of the L4 vertebral body. Grossly unchanged mild (approximately 5 mm) of retrolisthesis of L4 upon L5.  IMPRESSION: 1. Colonic diverticulosis without definite evidence of diverticulitis on this noncontrast examination. The colon is decompressed without definite evidence of obstruction. 2. Mild nodularity of the hepatic contour suggestive of cirrhosis. This finding is associated with small volume intra-abdominal ascites. 3. Incidental note made of an approximately 1.6 cm splenic artery aneurysm - this is of doubtful clinical concern in this postmenopausal patient. 4. Suspected fusiform ectasia of the origin of the portal vein, measuring approximately 1.8 cm, incompletely evaluated on this noncontrast examination and of uncertain clinical  significant. 5. Cardiomegaly.   Electronically Signed   By: Sandi Mariscal M.D.   On: 10/10/2013 15:48   Dg Chest 2 View  10/10/2013   CLINICAL DATA:  Cough.  EXAM: CHEST  2 VIEW  COMPARISON:  November 29, 2007.  FINDINGS: Stable cardiomegaly. Status post mitral valve replacement. Left-sided pacemaker is unchanged in position. No pneumothorax or pleural effusion is noted. No acute pulmonary disease is noted. Bony thorax is intact.  IMPRESSION: No acute cardiopulmonary abnormality seen.   Electronically Signed   By: Sabino Dick M.D.   On: 10/10/2013 17:04   Dg Chest Port 1 View  10/15/2013   CLINICAL DATA:  Line placement.  EXAM: PORTABLE CHEST - 1 VIEW  COMPARISON:  10/10/2013.  FINDINGS: PICC line noted with tip projected over superior vena cava. Cardiac pacer noted with lead tips in stable position. Prior median sternotomy. Mitral valve replacement. Cardiomegaly. No CHF. Pulmonary vascularity is normal. Tiny left pleural effusion cannot be completely excluded. Right apical interstitial prominence most consistent with interstitial fibrosis. A component of underlying bronchiectasis may be present.  IMPRESSION: 1. PICC line with tip in good anatomic position. 2. Mitral valve replacement. Median sternotomy. Cardiac pacer. Cardiomegaly. No pulmonary venous congestion. 3. Small left pleural effusion cannot be excluded . 4. Right apical interstitial scarring. A component of bronchiectasis may be  present.   Electronically Signed   By: Marcello Moores  Register   On: 10/15/2013 12:59    ASSESSMENT/PLAN:  79 year old female with prior mitral valve repair, previously described ejection fraction of 45% with cirrhosis of the liver, thrombocytopenia, abdominal discomfort, with new echocardiogram demonstrating possible ejection fraction of 35-40%.  1. Acute on chronic systolic/diastolic heart failure- Ejection fraction of 35-40% was a gross estimation however there did appear to be dysfunction present. Her mitral valve repair  is intact on echo. Her creatinine bumped with IV Lasix and Lasix was held. BP much more stable today after starting IV dobutamine.  She put out over 10L since admit and weight is down 7lb since admit. Dig stopped due to renal insuff. No ACE I due to renal failure. Appreciated HF service input. Will continue dobutamine and continue IV diuretics. CVP up this am to 17 so still volume overloaded.  Lasix increased to 80mg  IV q8 hours 2. Cirrhosis with ascites. Her abdomen is distended and she has had diarrhea. Cirrhosis most likely due to CHF but need to consider GI etiology. Appreciatte GI input  3. Perirectal abcess s/p drainage  4. Severe MR S/P MV repair with Carpentier ring annuloplasty  5. PAF on chronic anticoagulation - coumadin reversed for surgery - hold on coumadin restart due to anemia 6. CKD stage III - creatinine much improved with diuresis and Dobutamine  7. Place sequential compression devices for DVT prophylaxis  8. Hypokalemia - repleted  9. Anemia - transfuse for Hbg<7      Sueanne Margarita, MD  10/18/2013  8:57 AM

## 2013-10-18 NOTE — Progress Notes (Signed)
D/C foley, start sitz baths, continue in patient IV antibiotics and d/c them at d/c unless culture grows anything (currently no growth).  Will d/c vanc, cont zosyn.  Will sign off, call with questions/concerns.

## 2013-10-18 NOTE — Progress Notes (Signed)
[facility] PROGRESS NOTE  [name] Vi [id number] DOB: [birth year] DOA: 10/10/2013 PCP: No primary provider on file.  Assessment/Plan: 50 year CHF  EF 45-50% by ECO 2007, history of mitral valve annuloplasty, A fib, Pacemaker, on chronic anticoagulation with Coumadin, presents to the emergency room with a chief complaint of nausea vomiting and feeling weak for the past 3 days prior to admission. Patient was found to have acute on chronic systolic heart failure with decrease EF at 35 to 40 %, worsening renal function, and perirectal abscess. Patient under went I and D of perirectal abscess on 11-28 when her heart failure was stabilized. She has been on Dobutamine Gtt. She was on dopamine initially.   Left labia, perirectal Abscess with  fistulization to the vagina: She received  Vancomycin for 6 days. Continue with Zosyn day 7 until discharge.  Surgery sign off. Follow wound culture results.   WBC 8.7.  S/P I and D 11-28.    Acute on chronic renal Failure: Last cr per records at 1.3. Cr on admission at 1.4.  -Cr peak to  to 2. 4. Renal function improved on dopamine. Stable on IV lasix. Cr at 1.3.   Acute on chronic systolic heart failure - 2D echo  shows further decrease in her EF to 35-40% and anteroseptal hypokinesis. Cardiology consulted, appreciate input.  -Continue with IV lasix increase to 80 Mg IV TID, and Dobutamine.  -negative 12 L.   Acute blood loss anemia on Chronic anemia: post surgery. Monitor. Hb stable at 7. Changed ferrous sulfate to BID.   Gout, left LE ; continue with colchicine. Improving.  S/p mitral valve annuloplasty   Atrial fibrillation -Continue to hold Coumadin. INR at 1.6. Resume coumadin when ok by cardio. Monitor CBC.   Status post pacemaker   Chronic anticoagulation -will defer to cardio continuation of anticoagulation.   Thrombocytopenia - due to infection process.  Resolved.   Urinary tract infection - Patient started on empiric ceftriaxone  on admission. UA with 21 to 50 WBC. Change ceftriaxone to Zosyn due to # 1. Urine culture grew multiple bacterial morphotype. Treated.   Liver cirrhosis on imaging - patient without known history of any liver disease. She has ascites, elevated total bilirubin, thrombocytopenia.  viral hepatitis panel negative. GI consulted. Dr Percell Miller think cirrhosis could be due to NASH. No further work up at this point.   Diarrhea; Resolved. C diff negative.   Nausea/vomiting - due to probably acute illness. Resolved.   Code Status: DNR Family Communication: Care discussed with patient.  Disposition Plan: Remain step Down. Transfer to telemetry when off dopamine Gtt.    Consultants:  Surgery.  Cardiology  GI  Procedures:  none  Antibiotics:  Ceftriaxone 11-25---11-26  Zosyn 11-26  Vancomycin 11-26  HPI/Subjective: She is breathing better. Left ankle pain, foot pain improving.    Objective: Filed Vitals:   10/18/13 1521  BP: 140/46  Pulse: 71  Temp: 98.2 F (36.8 C)  Resp: 28    Intake/Output Summary (Last 24 hours) at 10/18/13 1644 Last data filed at 10/18/13 1522  Gross per 24 hour  Intake    427 ml  Output   3900 ml  Net  -3473 ml   Filed Weights   10/17/13 0539 10/18/13 0500 10/18/13 0800  Weight: 107.1 kg (236 lb 1.8 oz) 103.5 kg (228 lb 2.8 oz) 102.9 kg (226 lb 13.7 oz)    Exam:   General:  No distress  Cardiovascular: S 1, S 2 RRR  Respiratory: bilateral air movement, no significant crackles.   Abdomen: Bs present, soft, NT, distended.   Musculoskeletal: trace  edema.    Data Reviewed: Basic Metabolic Panel:  Recent Labs Lab 10/14/13 0905 10/15/13 0604 10/16/13 0500 10/17/13 0500 10/18/13 0500  NA 136 138 142 143 141  K 3.4* 3.6 3.1* 3.8 3.5  CL 101 104 105 105 102  CO2 20 20 24 26 27   GLUCOSE 96 88 108* 102* 92  BUN 51* 46* 34* 25* 19  CREATININE 1.88* 1.61* 1.28* 1.33* 1.31*  CALCIUM 8.2* 7.7* 7.3* 7.2* 7.0*   Liver Function  Tests: No results found for this basename: AST, ALT, ALKPHOS, BILITOT, PROT, ALBUMIN,  in the last 168 hours No results found for this basename: LIPASE, AMYLASE,  in the last 168 hours No results found for this basename: AMMONIA,  in the last 168 hours CBC:  Recent Labs Lab 10/14/13 1615 10/15/13 0604 10/16/13 1044 10/17/13 0500 10/18/13 0500  WBC 10.2 9.7 8.1 8.7 10.3  HGB 8.7* 7.8* 7.2* 7.2* 7.1*  HCT 26.1* 24.3* 22.1* 22.2* 22.6*  MCV 84.2 85.6 85.3 85.7 86.9  PLT 192 193 193 181 189   Cardiac Enzymes: No results found for this basename: CKTOTAL, CKMB, CKMBINDEX, TROPONINI,  in the last 168 hours BNP (last 3 results)  Recent Labs  10/10/13 1634 10/13/13 1113  PROBNP 6217.0* 8553.0*   CBG: No results found for this basename: GLUCAP,  in the last 168 hours  Recent Results (from the past 240 hour(s))  URINE CULTURE     Status: None   Collection Time    10/10/13  2:32 PM      Result Value Range Status   Specimen Description URINE, RANDOM   Final   Special Requests NONE   Final   Culture  Setup Time     Final   Value: 10/10/2013 20:40     Performed at [place]     Final   Value: >=100,000 COLONIES/ML     Performed at [facility]   Culture     Final   Value: Multiple bacterial morphotypes present, none predominant. Suggest appropriate recollection if clinically indicated.     Performed at [facility]   Report Status 10/12/2013 FINAL   Final  MRSA PCR SCREENING     Status: None   Collection Time    10/10/13  6:24 PM      Result Value Range Status   MRSA by PCR NEGATIVE  NEGATIVE Final   Comment:            The GeneXpert MRSA Assay (FDA     approved for NASAL specimens     only), is one component of a     comprehensive MRSA colonization     surveillance program. It is not     intended to diagnose MRSA     infection nor to guide or     monitor treatment for     MRSA infections.  CLOSTRIDIUM DIFFICILE BY PCR      Status: None   Collection Time    10/11/13  8:38 PM      Result Value Range Status   C difficile by pcr NEGATIVE  NEGATIVE Final  URINE CULTURE     Status: None   Collection Time    10/14/13  8:47 AM      Result Value Range Status   Specimen Description URINE, CLEAN CATCH   Final  Special Requests A   Final   Culture  Setup Time     Final   Value: 10/14/2013 10:02     Performed at [place]     Final   Value: NO GROWTH     Performed at [facility]   Culture     Final   Value: NO GROWTH     Performed at [facility]   Report Status 10/15/2013 FINAL   Final  ANAEROBIC CULTURE     Status: None   Collection Time    10/14/13 11:41 AM      Result Value Range Status   Specimen Description ABSCESS   Final   Special Requests PERINEAL ABSCESS   Final   Gram Stain     Final   Value: NO WBC SEEN     NOPEPI NO ORGANISMS SEEN     Performed at [facility]   Culture     Final   Value: NO ANAEROBES ISOLATED; CULTURE IN PROGRESS FOR 5 DAYS     Performed at [facility]   Report Status PENDING   Incomplete  CULTURE, ROUTINE-ABSCESS     Status: None   Collection Time    10/14/13 11:41 AM      Result Value Range Status   Specimen Description ABSCESS   Final   Special Requests PERINEAL ABSCESS   Final   Gram Stain     Final   Value: NO WBC SEEN     NOPEI NO ORGANISMS SEEN     Performed at [facility]   Culture     Final   Value: NO GROWTH 3 DAYS     Performed at [facility]   Report Status 10/18/2013 FINAL   Final     Studies: No results found.  Scheduled Meds: . beta carotene w/minerals  1 tablet Oral Daily  . colchicine  0.6 mg Oral BID  . ferrous sulfate  325 mg Oral BID WC  . furosemide  80 mg Intravenous Q8H  . loratadine  10 mg Oral Daily  . piperacillin-tazobactam (ZOSYN)  IV  3.375 g Intravenous Q8H  . potassium chloride  20 mEq Oral Daily  . sodium chloride  10-40 mL Intracatheter Q12H  .  sodium chloride  3 mL Intravenous Q12H   Continuous Infusions: . DOBUTamine 2.5 mcg/kg/min (10/18/13 [id number])    Principal Problem:   Perirectal abscess Active Problems:   Chronic anticoagulation   Liver cirrhosis   Thrombocytopenia   Nausea and vomiting   Acute on chronic combined systolic and diastolic congestive heart failure   UTI (urinary tract infection)   CKD (chronic kidney disease) stage 3, GFR 30-59 ml/min   Anemia   PAF (paroxysmal atrial fibrillation)   Biventricular cardiac pacemaker in situ    Time spent: 30 minutes.     [name]  [facility] Pager [phone]. If 7PM-7AM, please contact night-coverage at [email or web address], password [facility] 10/18/2013, 4:44 PM  LOS: 8 days

## 2013-10-18 NOTE — Progress Notes (Signed)
Order to d/c foley per surgery. Family wants to speak with cardiology first before foley is removed. Spoke to [employer], [place] on family request and made aware. Reccommended to  hold off on d/c until cardiology makes a decision per family request.

## 2013-10-18 NOTE — Progress Notes (Signed)
CSW presented list of bed offers from SNFs to pt & pt's daughter Santiago Glad, who was at bedside during [place] visit. Santiago Glad states she will go look at facilities this afternoon. CSW wrote phone number at the top of the list and has invited Santiago Glad to call as she goes and makes decisions about preferences for the SNF she might like pt to go to. Pt and Santiago Glad will make a joint decision on SNF. CSW asked pt how she is feeling today, and pt expressed that she feels better and was able to get some rest last night. CSW will check on pt again tomorrow.   Ky Barban, MSW, [facility] Clinical Social Worker [phone]

## 2013-10-18 NOTE — Progress Notes (Signed)
Pt's daughter Santiago Glad called CSW and alerted CSW that [facility] is first choice SNF. CSW called admissions rep at [facility] and left admissions rep a voicemail stating facility is pt/pt's daughter first choice. Admission rep will call CSW with any questions.    Ky Barban, MSW, [facility] Clinical Social Worker [phone]

## 2013-10-19 LAB — BASIC METABOLIC PANEL
CO2: 30 mEq/L (ref 19–32)
Calcium: 7.2 mg/dL — ABNORMAL LOW (ref 8.4–10.5)
Chloride: 98 mEq/L (ref 96–112)
Creatinine, Ser: 1.28 mg/dL — ABNORMAL HIGH (ref 0.50–1.10)
GFR calc Af Amer: 43 mL/min — ABNORMAL LOW (ref >90)
Potassium: 3.5 mEq/L (ref 3.5–5.1)
Sodium: 139 mEq/L (ref 135–145)

## 2013-10-19 LAB — CBC
HCT: 23.1 % — ABNORMAL LOW (ref 36.0–46.0)
Hemoglobin: 7.3 g/dL — ABNORMAL LOW (ref 12.0–15.0)
MCH: 27.2 pg (ref 26.0–34.0)
MCHC: 31.6 g/dL (ref 30.0–36.0)
RBC: 2.68 MIL/uL — ABNORMAL LOW (ref 3.87–5.11)
RDW: 15.8 % — ABNORMAL HIGH (ref 11.5–15.5)

## 2013-10-19 LAB — CARBOXYHEMOGLOBIN
Carboxyhemoglobin: 2.1 % — ABNORMAL HIGH (ref 0.5–1.5)
Methemoglobin: 1.3 % (ref 0.0–1.5)
O2 Saturation: 76.7 %
Total hemoglobin: 7.2 g/dL — ABNORMAL LOW (ref 12.0–16.0)

## 2013-10-19 LAB — PROTIME-INR
INR: 1.5 — ABNORMAL HIGH (ref 0.00–1.49)
Prothrombin Time: 17.7 seconds — ABNORMAL HIGH (ref 11.6–15.2)

## 2013-10-19 MED ORDER — POTASSIUM CHLORIDE CRYS ER 20 MEQ PO TBCR
40.0000 meq | EXTENDED_RELEASE_TABLET | Freq: Once | ORAL | Status: AC
  Administered 2013-10-19: 40 meq via ORAL
  Filled 2013-10-19: qty 2

## 2013-10-19 MED ORDER — WARFARIN SODIUM 7.5 MG PO TABS
7.5000 mg | ORAL_TABLET | Freq: Once | ORAL | Status: AC
  Administered 2013-10-19: 7.5 mg via ORAL
  Filled 2013-10-19: qty 1

## 2013-10-19 MED ORDER — DIGOXIN 125 MCG PO TABS
0.1250 mg | ORAL_TABLET | Freq: Every day | ORAL | Status: DC
  Administered 2013-10-19 – 2013-10-20 (×2): 0.125 mg via ORAL
  Filled 2013-10-19 (×2): qty 1

## 2013-10-19 MED ORDER — WARFARIN - PHARMACIST DOSING INPATIENT
Freq: Every day | Status: DC
  Administered 2013-10-19: 18:00:00

## 2013-10-19 MED ORDER — TORSEMIDE 20 MG PO TABS
60.0000 mg | ORAL_TABLET | Freq: Every day | ORAL | Status: DC
  Administered 2013-10-19 – 2013-10-20 (×2): 60 mg via ORAL
  Filled 2013-10-19 (×2): qty 3

## 2013-10-19 NOTE — Progress Notes (Addendum)
CSW followed up with [facility] again this morning and left admissions rep another voicemail explaining CSW called and left voicemail yesterday but never heard back. CSW requested return call from facility.  Addendum: CSW received call back from [facility] admissions, and rep states there are beds available at this time but that beds are sparse and CSW should follow up with [facility] when pt gets closer to d/c. CSW called pt's daughter and explained this in a voicemail to Santiago Glad, asking Santiago Glad if she has any second choice facilities in the event [facility] is fill when pt is ready for d/c. CSW invited Santiago Glad to call.  Ky Barban, MSW, [facility] Clinical Social Worker [phone]

## 2013-10-19 NOTE — Progress Notes (Addendum)
ANTICOAGULATION CONSULT NOTE - Initial Consult  Pharmacy Consult for warfarin Indication: atrial fibrillation  No Known Allergies  Patient Measurements: Height: 5\' 3"  (160 cm) Weight: 217 lb 13 oz (98.8 kg) IBW/kg (Calculated) : 52.4  Vital Signs: Temp: 97.9 F (36.6 C) (12/03 0857) Temp src: Oral (12/03 0857) BP: 124/50 mmHg (12/03 0857) Pulse Rate: 70 (12/03 0857)  Labs:  Recent Labs  10/17/13 0500 10/18/13 0500 10/19/13 0550  HGB 7.2* 7.1* 7.3*  HCT 22.2* 22.6* 23.1*  PLT 181 189 206  LABPROT 19.2* 19.5* 17.7*  INR 1.67* 1.70* 1.50*  CREATININE 1.33* 1.31* 1.28*    Estimated Creatinine Clearance: 36 ml/min (by C-G formula based on Cr of 1.28).   Medical History: Past Medical History  Diagnosis Date  . CHF (congestive heart failure)     a. Chronic combined systolic/diastolic CHF EF [id number] in 09/2006.  [name] Hypertension   . Mitral valve disorder     a. Severe MR s/p repair 2003 (28 mm annuloplasty ring and and oversew of LAA). No CAD by cath at that time.  . Atrial fibrillation     a. Previously failed DCCV on amiodarone. b. Previously on Tikosyn -started 2008.  [name] Pacemaker     a. 2003: post-op afib after MR repair then symptomatic bradycardia requiring pacemaker. b. Upgrade to Medtronic Bi-V Pacemaker 2007.  [name] Cirrhosis     a. Newly recognized 09/2013 - by CT.  . CKD (chronic kidney disease)   . NSVT (nonsustained ventricular tachycardia)     a. Isolated event during 09/2007 adm.    Medications:  Scheduled:  . beta carotene w/minerals  1 tablet Oral Daily  . colchicine  0.6 mg Oral BID  . digoxin  0.125 mg Oral Daily  . ferrous sulfate  325 mg Oral BID WC  . loratadine  10 mg Oral Daily  . piperacillin-tazobactam (ZOSYN)  IV  3.375 g Intravenous Q8H  . potassium chloride  20 mEq Oral Daily  . sodium chloride  10-40 mL Intracatheter Q12H  . sodium chloride  3 mL Intravenous Q12H  . torsemide  60 mg Oral Daily   Infusions:  . DOBUTamine 2.5  mcg/kg/min (10/18/13 [id number])    Assessment: 79 yo F on coumadin PTA for afib and also with hx of MV repair in 2003.  Coumadin has been held d/t need for surgical I&D of left labia, perirectal abscess w/ fistulization to the vagina.  This is POD#5 and pharmacy has been consulted to resume coumadin.  Of note, patient has no hx of liver disease but GI was consulted d/t ascites, elevated total bilirubin, and thrombocytopenia. GI reports cirrhosis may be d/t NASH, no further work up at this point.  INR this morning is 1.50.  Patient is s/p reversal with Vit K IV 5mg  on 11/28 for surgery. with a goal INR of 2-3, CHADS2 = 3. No plans to bridge at this time.  Goal of Therapy:  INR 2-3 Monitor platelets by anticoagulation protocol: Yes   Plan:  - give 7.5mg  x1 dose tonight - daily INR - monitor for s/s of bleeding  Ovid Curd E. Jacqlyn Larsen, PharmD Clinical Pharmacist - Resident Pager: [phone] Pharmacy: [phone] 10/19/2013 9:53 AM    Addendum: ================================= Correction:  Patient's PTA Coumadin is 2.5mg  daily except 5mg  on Monday  Ora Bollig E. Jacqlyn Larsen, PharmD Clinical Pharmacist - Resident Pager: [phone] Pharmacy: [phone] 10/20/2013 10:42 AM

## 2013-10-19 NOTE — Progress Notes (Signed)
Patient ID: [name], female   DOB: 1926/01/15, 79 y.o.   MRN: [id number] Advanced Heart Failure Rounding Note  SUBJECTIVE:  Gout pain better but still present.  Very good diuresis yesterday, CVP now 9.  Breathing better.    . beta carotene w/minerals  1 tablet Oral Daily  . colchicine  0.6 mg Oral BID  . ferrous sulfate  325 mg Oral BID WC  . loratadine  10 mg Oral Daily  . piperacillin-tazobactam (ZOSYN)  IV  3.375 g Intravenous Q8H  . potassium chloride  20 mEq Oral Daily  . potassium chloride  40 mEq Oral Once  . sodium chloride  10-40 mL Intracatheter Q12H  . sodium chloride  3 mL Intravenous Q12H  . torsemide  60 mg Oral Daily  dobutamine @ 2.5    Filed Vitals:   10/18/13 1521 10/18/13 2000 10/19/13 0026 10/19/13 0425  BP: 140/46 139/33 131/50 145/54  Pulse: 71 69 70   Temp: 98.2 F (36.8 C) 99.1 F (37.3 C) 98.3 F (36.8 C) 98.2 F (36.8 C)  TempSrc: Oral Oral Oral Oral  Resp: 28 26 23    Height:      Weight:    217 lb 13 oz (98.8 kg)  SpO2: 99% 96% 95% 96%    Intake/Output Summary (Last 24 hours) at 10/19/13 0755 Last data filed at 10/19/13 0523  Gross per 24 hour  Intake    579 ml  Output   6475 ml  Net  -5896 ml    LABS: Basic Metabolic Panel:  Recent Labs  10/18/13 0500 10/19/13 0550  NA 141 139  K 3.5 3.5  CL 102 98  CO2 27 30  GLUCOSE 92 92  BUN 19 15  CREATININE 1.31* 1.28*  CALCIUM 7.0* 7.2*   Liver Function Tests: No results found for this basename: AST, ALT, ALKPHOS, BILITOT, PROT, ALBUMIN,  in the last 72 hours No results found for this basename: LIPASE, AMYLASE,  in the last 72 hours CBC:  Recent Labs  10/18/13 0500 10/19/13 0550  WBC 10.3 9.2  HGB 7.1* 7.3*  HCT 22.6* 23.1*  MCV 86.9 86.2  PLT 189 206   Cardiac Enzymes: No results found for this basename: CKTOTAL, CKMB, CKMBINDEX, TROPONINI,  in the last 72 hours BNP: No components found with this basename: POCBNP,  D-Dimer: No results found for this basename:  DDIMER,  in the last 72 hours Hemoglobin A1C: No results found for this basename: HGBA1C,  in the last 72 hours Fasting Lipid Panel: No results found for this basename: CHOL, HDL, LDLCALC, TRIG, CHOLHDL, LDLDIRECT,  in the last 72 hours Thyroid Function Tests: No results found for this basename: TSH, T4TOTAL, FREET3, T3FREE, THYROIDAB,  in the last 72 hours Anemia Panel: No results found for this basename: VITAMINB12, FOLATE, FERRITIN, TIBC, IRON, RETICCTPCT,  in the last 72 hours  RADIOLOGY: Ct Abdomen Pelvis Wo Contrast  10/10/2013   CLINICAL DATA:  Vomiting and diarrhea for 3-4 days, generalized weakness, on Coumadin, history is CHF  EXAM: CT ABDOMEN AND PELVIS WITHOUT CONTRAST  TECHNIQUE: Multidetector CT imaging of the abdomen and pelvis was performed following the standard protocol without intravenous contrast.  COMPARISON:  Lumbar spine CT -06/15/2009  FINDINGS: The lack of intravenous contrast limits the ability to evaluate solid abdominal organs.  There is mild nodularity of the hepatic contour, nonspecific though could be seen in the setting of cirrhosis. This finding is associated with small volume of intra-abdominal ascites. Post cholecystectomy. There is  apparent fusiform ectasia of the origin of the portal vein regional to the confluence of the superior mesenteric and splenic veins measuring approximately 2.8 cm in greatest oblique axial dimension (image 31, series 2).  The bilateral kidneys are slightly atrophic compatible with provided history of renal insufficiency. No discrete renal stones. No urinary obstruction or perinephric stranding. Normal noncontrast appearance of the bilateral adrenal glands. There is a crescentic approximately 1.6 x 1.4 cm presumed partially calcified splenic artery aneurysm (image 26, series 2). Additional smaller presumed splenic aneurysms are seen about the splenic hilum.  Scattered colonic diverticulosis without evidence of diverticulitis. The colon is  largely decompressed. The bowel is otherwise normal in course and caliber without discrete area of wall thickening or evidence of obstruction. The appendix is not definitely identified, however there is no definitive inflammatory change within the right lower abdominal quadrant. No pneumoperitoneum, pneumatosis or portal venous gas.  Atherosclerotic plaque within a normal caliber abdominal aorta. Scattered shotty retroperitoneal lymph nodes are individually not enlarged by size criteria. No retroperitoneal, mesenteric, pelvic or inguinal lymphadenopathy on this noncontrast examination.  The pelvic organs her expectantly atrophic. No discrete adnexal lesion.  Limited visualization of the lower thorax demonstrates minimal subsegmental atelectasis within the caudal aspect of the right middle lobe in an left lower lobe. No focal airspace opacities. No pleural effusion.  Cardiomegaly. Ventricular pacer leads are seen within the right atrium, ventricle and coronary sinus. No pericardial effusion.  No acute or aggressive osseous abnormalities. There is mild straightening expected lumbar lordosis. Grossly unchanged mild (approximately 25%) compression deformity involving the anterior aspect of the superior endplate of the L4 vertebral body. Grossly unchanged mild (approximately 5 mm) of retrolisthesis of L4 upon L5.  IMPRESSION: 1. Colonic diverticulosis without definite evidence of diverticulitis on this noncontrast examination. The colon is decompressed without definite evidence of obstruction. 2. Mild nodularity of the hepatic contour suggestive of cirrhosis. This finding is associated with small volume intra-abdominal ascites. 3. Incidental note made of an approximately 1.6 cm splenic artery aneurysm - this is of doubtful clinical concern in this postmenopausal patient. 4. Suspected fusiform ectasia of the origin of the portal vein, measuring approximately 1.8 cm, incompletely evaluated on this noncontrast examination  and of uncertain clinical significant. 5. Cardiomegaly.   Electronically Signed   By: Sandi Mariscal M.D.   On: 10/10/2013 15:48   Dg Chest 2 View  10/10/2013   CLINICAL DATA:  Cough.  EXAM: CHEST  2 VIEW  COMPARISON:  November 29, 2007.  FINDINGS: Stable cardiomegaly. Status post mitral valve replacement. Left-sided pacemaker is unchanged in position. No pneumothorax or pleural effusion is noted. No acute pulmonary disease is noted. Bony thorax is intact.  IMPRESSION: No acute cardiopulmonary abnormality seen.   Electronically Signed   By: Sabino Dick M.D.   On: 10/10/2013 17:04   Dg Chest Port 1 View  10/15/2013   CLINICAL DATA:  Line placement.  EXAM: PORTABLE CHEST - 1 VIEW  COMPARISON:  10/10/2013.  FINDINGS: PICC line noted with tip projected over superior vena cava. Cardiac pacer noted with lead tips in stable position. Prior median sternotomy. Mitral valve replacement. Cardiomegaly. No CHF. Pulmonary vascularity is normal. Tiny left pleural effusion cannot be completely excluded. Right apical interstitial prominence most consistent with interstitial fibrosis. A component of underlying bronchiectasis may be present.  IMPRESSION: 1. PICC line with tip in good anatomic position. 2. Mitral valve replacement. Median sternotomy. Cardiac pacer. Cardiomegaly. No pulmonary venous congestion. 3. Small left pleural effusion  cannot be excluded . 4. Right apical interstitial scarring. A component of bronchiectasis may be present.   Electronically Signed   By: Marcello Moores  Register   On: 10/15/2013 12:59    PHYSICAL EXAM General: NAD Neck: JVP 8 cm, no thyromegaly or thyroid nodule.  Lungs: Slight crackles at bases. CV: Nondisplaced PMI.  Heart regular S1/S2, no S3/S4, 2/6 SEM.  Trace ankle edema.   Abdomen: Soft, nontender, no hepatosplenomegaly, no distention.  Neurologic: Alert and oriented x 3.  Psych: Normal affect. Extremities: No clubbing or cyanosis.   TELEMETRY: Reviewed telemetry pt in afib with  pacing  ASSESSMENT AND PLAN: 1. 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 Volume status better, creatinine stable.  CVP 9, weight down.  - Stop Lasix now (got 6 am dose) and start torsemide 60 mg daily at 2 pm. - Continue dobutamine @ 2.5 today, will stop tomorrow.  Restart digoxin.  - Follow CVP   - EF seems to be lower than past but echo difficult. Had no significant CAD on cath about 10 years ago. Would hold off on LHC for now with AKI.  - Hold off on beta blocker given dobutamine use, no ACEI with AKI.  2. AKI on CKD: Creatinine stable.  3. Atrial fibrillation: Chronic. Paced rhythm, rate stable despite inotropes. Start warfarin when stable from surgical standpoint, ?today. 4. Cirrhosis: Possibly due to right heart failure/hepatic congestion. Only mild amount of ascites noted on CT.  5. Peri-rectal abscess: s/p surgery. 6. MV repair: Stable without significant MR on last echo.  7. Gout pain: Better on colchicine.   8. Anemia: Post-op.  Hemoglobin higher today.   [name] 10/19/2013 7:55 AM

## 2013-10-19 NOTE — Progress Notes (Signed)
CSW got call from pt's daughter Santiago Glad, daughter states first choice facility is [facility], second is [facility] and third is [facility]. CSW called [facility] and let them know, and they can take pt when she is medically ready. CSW will facilitate discharge to [facility]   Ky Barban, MSW, [facility] Clinical Social Worker [phone]

## 2013-10-19 NOTE — Progress Notes (Signed)
Physical Therapy Treatment Patient Details Name: [name] MRN: [id number] DOB: [birth year] Today's Date: 10/19/2013 Time: [id number] PT Time Calculation (min): 23 min  PT Assessment / Plan / Recommendation  History of Present Illness 79 year old female with prior mitral valve repair, previously described ejection fraction of 45% with cirrhosis of the liver, thrombocytopenia, abdominal discomfort, with new echocardiogram demonstrating possible ejection fraction of 35-40%.  Cirrhosis with ascites. Her abdomen is distended and she has had diarrhea. Cirrhosis most likely due to CHF but need to consider GI etiology.  Pt also with Perirectal abcess requiring drainage on 10/14/13.   PT Comments   Patient progressed back to able to take steps today, though still significantly painful with weight bearing on left foot.  Pt bright and hopeful for full recovery.  Agree with SNF level rehab prior to d/c home.  Follow Up Recommendations  SNF;Supervision for mobility/OOB     Does the patient have the potential to tolerate intense rehabilitation   N/A  Barriers to Discharge  None      Equipment Recommendations  None recommended by PT    Recommendations for Other Services  None  Frequency Min 3X/week   Progress towards PT Goals Progress towards PT goals: Progressing toward goals  Plan Current plan remains appropriate    Precautions / Restrictions Precautions Precautions: Fall Restrictions Weight Bearing Restrictions: No   Pertinent Vitals/Pain 9/10 pain left foot weight bearing    Mobility  Bed Mobility Bed Mobility: Not assessed Details for Bed Mobility Assistance: in recliner Transfers Sit to Stand: 4: Min assist;From chair/3-in-1;With upper extremity assist Stand to Sit: 4: Min assist;To chair/3-in-1;With upper extremity assist Ambulation/Gait Ambulation/Gait Assistance: 4: Min assist;3: Mod assist Ambulation Distance (Feet): 5 Feet (forward then backwards) Assistive device:  Rolling walker Ambulation/Gait Assistance Details: extremely antalgic on left foot and needed increased assist at times to improve step length on right Gait Pattern: Step-to pattern;Antalgic;Decreased step length - right;Shuffle    Exercises General Exercises - Upper Extremity Shoulder Flexion: AROM;15 reps;Both (owder in one hand and spray in other for strength) Elbow Flexion: AROM;Both;20 reps;Other (comment) (powder in one hand and spray in other for strength) General Exercises - Lower Extremity Long Arc Quad: AROM;Both;20 reps;Seated Hip Flexion/Marching: AROM;Both;20 reps;Seated Toe Raises: AROM;Both;20 reps;Seated Heel Raises: AROM;Both;20 reps;Seated     PT Goals (current goals can now be found in the care plan section)    Visit Information  Last PT Received On: 10/19/13 Assistance Needed: +1 History of Present Illness: 79 year old female with prior mitral valve repair, previously described ejection fraction of 45% with cirrhosis of the liver, thrombocytopenia, abdominal discomfort, with new echocardiogram demonstrating possible ejection fraction of 35-40%.  Cirrhosis with ascites. Her abdomen is distended and she has had diarrhea. Cirrhosis most likely due to CHF but need to consider GI etiology.  Pt also with Perirectal abcess requiring drainage on 10/14/13.    Subjective Data   Patient reports feels good to exercise   Cognition  Cognition Arousal/Alertness: Awake/alert Behavior During Therapy: WFL for tasks assessed/performed Overall Cognitive Status: Within Functional Limits for tasks assessed    Balance     End of Session PT - End of Session Equipment Utilized During Treatment: Gait belt Activity Tolerance: Patient limited by pain Patient left: in chair Nurse Communication: Mobility status   GP     [facility] 10/19/2013, 12:02 PM Magda Kiel, [place] 10/19/2013

## 2013-10-19 NOTE — Progress Notes (Addendum)
[facility] PROGRESS NOTE  [name] [id number] DOB: 11-13-1926 DOA: 10/10/2013 PCP: No primary provider on file.  Assessment/Plan: 89 year CHF EF 45-50% by ECO 2007, history of mitral valve annuloplasty, A fib, Pacemaker, on chronic anticoagulation with Coumadin, presents to the emergency room with a chief complaint of nausea vomiting and feeling weak for the past 3 days prior to admission. Patient was found to have acute on chronic systolic heart failure with decrease EF at 35 to 40 %, worsening renal function, and perirectal abscess. Patient under went I and D of perirectal abscess on 11-28 when her heart failure was stabilized. She has been on Dobutamine Gtt. She was on dopamine initially.   1. Left labia, perirectal Abscess with fistulization to the vagina: She received Vancomycin for 6 days. Continue with Zosyn day 7 until discharge. Surgery sign off. Follow wound culture results neg. S/P I and D 11-28.   2. Acute on chronic renal Failure: Last cr per records at 1.3. Stable on IV lasix. Cr at 1.3. Monitor   3. Acute on chronic CHF systolic HF, 2D echo shows further decrease in her EF to 35-40% and anteroseptal hypokinesis.  -duresed well on lasix, I/O neg 17L; cont diuresis, and Dobutamine. Per cards', resume BB  4. Acute blood loss anemia on Chronic anemia: post surgery. Tf if Hg < 7. 0  5. Atrial fibrillation; PPM; S/p mitral valve annuloplasty coumadin was on hold due to surgery;  -resume coumadin today; started dig per cards;   6. Urinary tract infection; Urine culture grew multiple bacterial morphotype. Treated.   7. Liver cirrhosis on imaging; patient without known history of any liver disease. She has ascites, elevated total bilirubin, thrombocytopenia. viral hepatitis panel negative. GI consulted. Dr Percell Miller think cirrhosis could be due to NASH. No further work up at this point.   8. Diarrhea; Resolved. C diff negative.    Code Status: DNR Family  Communication: none at the bedside (indicate person spoken with, relationship, and if by phone, the number) Disposition Plan: pend clinical improvement; SNF upon d/c    Consultants:  Cardiology   Surgery  GI  Procedures:  none  Antibiotics: Ceftriaxone 11-25---11-26 Zosyn 11-26 Vancomycin 11-26   HPI/Subjective: alert  Objective: Filed Vitals:   10/19/13 0857  BP: 124/50  Pulse: 70  Temp: 97.9 F (36.6 C)  Resp: 24    Intake/Output Summary (Last 24 hours) at 10/19/13 0910 Last data filed at 10/19/13 0858  Gross per 24 hour  Intake  729.6 ml  Output   7675 ml  Net -6945.4 ml   Filed Weights   10/18/13 0500 10/18/13 0800 10/19/13 0425  Weight: 103.5 kg (228 lb 2.8 oz) 102.9 kg (226 lb 13.7 oz) 98.8 kg (217 lb 13 oz)    Exam:   General:  alert  Cardiovascular: s1,s2 rrr  Respiratory: few crackles in LL  Abdomen: soft, nt, nd   Musculoskeletal: no edema   Data Reviewed: Basic Metabolic Panel:  Recent Labs Lab 10/15/13 0604 10/16/13 0500 10/17/13 0500 10/18/13 0500 10/19/13 0550  NA 138 142 143 141 139  K 3.6 3.1* 3.8 3.5 3.5  CL 104 105 105 102 98  CO2 20 24 26 27 30   GLUCOSE 88 108* 102* 92 92  BUN 46* 34* 25* 19 15  CREATININE 1.61* 1.28* 1.33* 1.31* 1.28*  CALCIUM 7.7* 7.3* 7.2* 7.0* 7.2*   Liver Function Tests: No results found for this basename: AST, ALT, ALKPHOS, BILITOT, PROT, ALBUMIN,  in the last  168 hours No results found for this basename: LIPASE, AMYLASE,  in the last 168 hours No results found for this basename: AMMONIA,  in the last 168 hours CBC:  Recent Labs Lab 10/15/13 0604 10/16/13 1044 10/17/13 0500 10/18/13 0500 10/19/13 0550  WBC 9.7 8.1 8.7 10.3 9.2  HGB 7.8* 7.2* 7.2* 7.1* 7.3*  HCT 24.3* 22.1* 22.2* 22.6* 23.1*  MCV 85.6 85.3 85.7 86.9 86.2  PLT 193 193 181 189 206   Cardiac Enzymes: No results found for this basename: CKTOTAL, CKMB, CKMBINDEX, TROPONINI,  in the last 168 hours BNP (last 3  results)  Recent Labs  10/10/13 1634 10/13/13 1113  PROBNP 6217.0* 8553.0*   CBG: No results found for this basename: GLUCAP,  in the last 168 hours  Recent Results (from the past 240 hour(s))  URINE CULTURE     Status: None   Collection Time    10/10/13  2:32 PM      Result Value Range Status   Specimen Description URINE, RANDOM   Final   Special Requests NONE   Final   Culture  Setup Time     Final   Value: 10/10/2013 20:40     Performed at [facility]     Final   Value: >=100,000 COLONIES/ML     Performed at [facility]   Culture     Final   Value: Multiple bacterial morphotypes present, none predominant. Suggest appropriate recollection if clinically indicated.     Performed at [facility]   Report Status 10/12/2013 FINAL   Final  MRSA PCR SCREENING     Status: None   Collection Time    10/10/13  6:24 PM      Result Value Range Status   MRSA by PCR NEGATIVE  NEGATIVE Final   Comment:            The GeneXpert MRSA Assay (FDA     approved for NASAL specimens     only), is one component of a     comprehensive MRSA colonization     surveillance program. It is not     intended to diagnose MRSA     infection nor to guide or     monitor treatment for     MRSA infections.  CLOSTRIDIUM DIFFICILE BY PCR     Status: None   Collection Time    10/11/13  8:38 PM      Result Value Range Status   C difficile by pcr NEGATIVE  NEGATIVE Final  URINE CULTURE     Status: None   Collection Time    10/14/13  8:47 AM      Result Value Range Status   Specimen Description URINE, CLEAN CATCH   Final   Special Requests A   Final   Culture  Setup Time     Final   Value: 10/14/2013 10:02     Performed at [facility]     Final   Value: NO GROWTH     Performed at [facility]   Culture     Final   Value: NO GROWTH     Performed at [facility]   Report Status 10/15/2013 FINAL   Final  ANAEROBIC  CULTURE     Status: None   Collection Time    10/14/13 11:41 AM      Result Value Range Status   Specimen Description ABSCESS  Final   Special Requests PERINEAL ABSCESS   Final   Gram Stain     Final   Value: NO WBC SEEN     NOPEPI NO ORGANISMS SEEN     Performed at [facility]   Culture     Final   Value: NO ANAEROBES ISOLATED; CULTURE IN PROGRESS FOR 5 DAYS     Performed at [facility]   Report Status PENDING   Incomplete  CULTURE, ROUTINE-ABSCESS     Status: None   Collection Time    10/14/13 11:41 AM      Result Value Range Status   Specimen Description ABSCESS   Final   Special Requests PERINEAL ABSCESS   Final   Gram Stain     Final   Value: NO WBC SEEN     NOPEI NO ORGANISMS SEEN     Performed at [facility]   Culture     Final   Value: NO GROWTH 3 DAYS     Performed at [facility]   Report Status 10/18/2013 FINAL   Final     Studies: No results found.  Scheduled Meds: . beta carotene w/minerals  1 tablet Oral Daily  . colchicine  0.6 mg Oral BID  . digoxin  0.125 mg Oral Daily  . ferrous sulfate  325 mg Oral BID WC  . loratadine  10 mg Oral Daily  . piperacillin-tazobactam (ZOSYN)  IV  3.375 g Intravenous Q8H  . potassium chloride  20 mEq Oral Daily  . potassium chloride  40 mEq Oral Once  . sodium chloride  10-40 mL Intracatheter Q12H  . sodium chloride  3 mL Intravenous Q12H  . torsemide  60 mg Oral Daily   Continuous Infusions: . DOBUTamine 2.5 mcg/kg/min (10/18/13 [id number])    Principal Problem:   Perirectal abscess Active Problems:   Chronic anticoagulation   Liver cirrhosis   Thrombocytopenia   Nausea and vomiting   Acute on chronic combined systolic and diastolic congestive heart failure   UTI (urinary tract infection)   CKD (chronic kidney disease) stage 3, GFR 30-59 ml/min   Anemia   PAF (paroxysmal atrial fibrillation)   Biventricular cardiac pacemaker in situ    Time spent: >35 minutes      Kinnie Feil  [facility] Pager [phone]. If 7PM-7AM, please contact night-coverage at [email or web address], password [facility] 10/19/2013, 9:10 AM  LOS: 9 days

## 2013-10-20 LAB — BASIC METABOLIC PANEL
BUN: 17 mg/dL (ref 6–23)
CO2: 25 mEq/L (ref 19–32)
Chloride: 97 mEq/L (ref 96–112)
GFR calc Af Amer: 42 mL/min — ABNORMAL LOW (ref >90)
GFR calc non Af Amer: 36 mL/min — ABNORMAL LOW (ref >90)
Potassium: 3.4 mEq/L — ABNORMAL LOW (ref 3.5–5.1)
Sodium: 139 mEq/L (ref 135–145)

## 2013-10-20 LAB — CBC
MCH: 26.4 pg (ref 26.0–34.0)
MCHC: 30.8 g/dL (ref 30.0–36.0)
Platelets: 227 10*3/uL (ref 150–400)
WBC: 8.8 10*3/uL (ref 4.0–10.5)

## 2013-10-20 LAB — ANAEROBIC CULTURE

## 2013-10-20 LAB — CARBOXYHEMOGLOBIN
Carboxyhemoglobin: 1.9 % — ABNORMAL HIGH (ref 0.5–1.5)
O2 Saturation: 60.5 %

## 2013-10-20 LAB — PROTIME-INR
INR: 1.28 (ref 0.00–1.49)
Prothrombin Time: 15.7 seconds — ABNORMAL HIGH (ref 11.6–15.2)

## 2013-10-20 MED ORDER — WARFARIN SODIUM 7.5 MG PO TABS
7.5000 mg | ORAL_TABLET | Freq: Once | ORAL | Status: AC
  Administered 2013-10-20: 7.5 mg via ORAL
  Filled 2013-10-20: qty 1

## 2013-10-20 MED ORDER — TRAMADOL HCL 50 MG PO TABS: 25.0000 mg | ORAL_TABLET | Freq: Four times a day (QID) | ORAL | Status: DC | PRN

## 2013-10-20 MED ORDER — ALBUTEROL SULFATE (5 MG/ML) 0.5% IN NEBU: 2.5000 mg | INHALATION_SOLUTION | Freq: Four times a day (QID) | RESPIRATORY_TRACT | Status: DC | PRN

## 2013-10-20 MED ORDER — CARVEDILOL 6.25 MG PO TABS
6.2500 mg | ORAL_TABLET | Freq: Two times a day (BID) | ORAL | Status: DC
  Filled 2013-10-20 (×2): qty 1

## 2013-10-20 MED ORDER — TORSEMIDE 20 MG PO TABS: 60.0000 mg | ORAL_TABLET | Freq: Every day | ORAL | Status: DC

## 2013-10-20 NOTE — Progress Notes (Signed)
CSW paged MD asking if pt is ready for discharge to SNF today, as discharge summary is completed and signed. MD verifies pt can discharge today. CSW compiling packet and initiating this process now.   Ky Barban, MSW, [facility] Clinical Social Worker [phone]

## 2013-10-20 NOTE — Progress Notes (Addendum)
ANTICOAGULATION CONSULT NOTE - Initial Consult  Pharmacy Consult for warfarin Indication: atrial fibrillation  No Known Allergies  Patient Measurements: Height: 5\' 3"  (160 cm) Weight: 217 lb 13 oz (98.8 kg) IBW/kg (Calculated) : 52.4  Vital Signs: Temp: 97 F (36.1 C) (12/04 0840) Temp src: Oral (12/04 0840) BP: 108/49 mmHg (12/04 0840) Pulse Rate: 74 (12/04 0840)  Labs:  Recent Labs  10/18/13 0500 10/19/13 0550 10/20/13 0500  HGB 7.1* 7.3* 7.6*  HCT 22.6* 23.1* 24.7*  PLT 189 206 227  LABPROT 19.5* 17.7* 15.7*  INR 1.70* 1.50* 1.28  CREATININE 1.31* 1.28* 1.30*    Estimated Creatinine Clearance: 35.5 ml/min (by C-G formula based on Cr of 1.3).   Medical History: Past Medical History  Diagnosis Date  . CHF (congestive heart failure)     a. Chronic combined systolic/diastolic CHF EF [id number] in 09/2006.  [name] Hypertension   . Mitral valve disorder     a. Severe MR s/p repair 2003 (28 mm annuloplasty ring and and oversew of LAA). No CAD by cath at that time.  . Atrial fibrillation     a. Previously failed DCCV on amiodarone. b. Previously on Tikosyn -started 2008.  [name] Pacemaker     a. 2003: post-op afib after MR repair then symptomatic bradycardia requiring pacemaker. b. Upgrade to Medtronic Bi-V Pacemaker 2007.  [name] Cirrhosis     a. Newly recognized 09/2013 - by CT.  . CKD (chronic kidney disease)   . NSVT (nonsustained ventricular tachycardia)     a. Isolated event during 09/2007 adm.    Medications:  Scheduled:  . beta carotene w/minerals  1 tablet Oral Daily  . carvedilol  6.25 mg Oral BID WC  . colchicine  0.6 mg Oral BID  . digoxin  0.125 mg Oral Daily  . ferrous sulfate  325 mg Oral BID WC  . loratadine  10 mg Oral Daily  . piperacillin-tazobactam (ZOSYN)  IV  3.375 g Intravenous Q8H  . potassium chloride  20 mEq Oral Daily  . sodium chloride  10-40 mL Intracatheter Q12H  . sodium chloride  3 mL Intravenous Q12H  . torsemide  60 mg Oral Daily  .  Warfarin - Pharmacist Dosing Inpatient   Does not apply q1800   Infusions:     Assessment: 79 yo F on coumadin PTA for afib and also with hx of MV repair in 2003.  Coumadin had been held d/t need for surgical I&D and was restarted yesterday.  INR this morning down to 1.28.  Patient is s/p reversal with Vit K IV 5mg  on 11/28 for surgery.  PTA Coumadin was 2.5mg  daily except 5mg  on Mondays with a goal INR of 2-3, CHADS2 = 3. No plans to bridge at this time. H/H low but stable with no noted s/s bleeding  Goal of Therapy:  INR 2-3 Monitor platelets by anticoagulation protocol: Yes   Plan:  - give warfarin 7.5mg  x1 dose today prior to d/c - upon d/c, recommend resuming home dose (2.5 mg daily except 5 mg on Mondays)   Harolyn Rutherford, PharmD Clinical Pharmacist - Resident Pager: [phone] Pharmacy: [phone] 10/20/2013 10:18 AM

## 2013-10-20 NOTE — Progress Notes (Signed)
Patient ID: [name], female   DOB: March 01, 1926, 79 y.o.   MRN: [id number]  Agree with Dr. Radford Pax.  Dobutamine stopped this morning.  Patient now on digoxin, Coreg (started today), and torsemide.  Renal function stable. If creatinine stable tomorrow, would initiate ACEI.  CVP low.  Weight down by about 16 lbs.   [name] 10/20/2013

## 2013-10-20 NOTE — Progress Notes (Signed)
CSW had MD sign DNR order and has compiled discharge packet. MD to sign prescription and CSW will put this in packet to complete all requirements for discharge to SNF. CSW called facility and alerted them discharge is today. Facility requested transport for 1:30pm, as their rep who does paperwork with families is at MD appointment with another resident. CSW alerted pt's RN that plan is for [facility] pickup at 1:30pm. CSW also alerted pt's daughter Santiago Glad of plan.   Ky Barban, MSW, [facility] Clinical Social Worker [phone]

## 2013-10-20 NOTE — Progress Notes (Addendum)
SUBJECTIVE:  No compliants  OBJECTIVE:   Vitals:   Filed Vitals:   10/19/13 2019 10/20/13 0057 10/20/13 0200 10/20/13 0400  BP:  130/55  133/45  Pulse:   70 70  Temp: 99.3 F (37.4 C) 98.9 F (37.2 C)  98.9 F (37.2 C)  TempSrc: Oral Oral  Oral  Resp:   20 18  Height:      Weight:      SpO2:   96% 95%   I&O's:   Intake/Output Summary (Last 24 hours) at 10/20/13 [id number] Last data filed at 10/20/13 0700  Gross per 24 hour  Intake  887.4 ml  Output   4851 ml  Net -3963.6 ml   TELEMETRY: Reviewed telemetry pt in vpaced:     PHYSICAL EXAM General: Well developed, well nourished, in no acute distress Head: Eyes PERRLA, No xanthomas.   Normal cephalic and atramatic  Lungs:   Clear bilaterally to auscultation and percussion. Heart:   HRRR S1 S2 Pulses are 2+ & equal. Abdomen: Bowel sounds are positive, abdomen soft and non-tender without masses Extremities:  Trace edema RLE, 2+ edema LLE with gouty flare Neuro: Alert and oriented X 3. Psych:  Good affect, responds appropriately   LABS: Basic Metabolic Panel:  Recent Labs  10/19/13 0550 10/20/13 0500  NA 139 139  K 3.5 3.4*  CL 98 97  CO2 30 25  GLUCOSE 92 93  BUN 15 17  CREATININE 1.28* 1.30*  CALCIUM 7.2* 7.5*   Liver Function Tests: No results found for this basename: AST, ALT, ALKPHOS, BILITOT, PROT, ALBUMIN,  in the last 72 hours No results found for this basename: LIPASE, AMYLASE,  in the last 72 hours CBC:  Recent Labs  10/19/13 0550 10/20/13 0500  WBC 9.2 8.8  HGB 7.3* 7.6*  HCT 23.1* 24.7*  MCV 86.2 85.8  PLT 206 227   Cardiac Enzymes: No results found for this basename: CKTOTAL, CKMB, CKMBINDEX, TROPONINI,  in the last 72 hours BNP: No components found with this basename: POCBNP,  D-Dimer: No results found for this basename: DDIMER,  in the last 72 hours Hemoglobin A1C: No results found for this basename: HGBA1C,  in the last 72 hours Fasting Lipid Panel: No results found for this  basename: CHOL, HDL, LDLCALC, TRIG, CHOLHDL, LDLDIRECT,  in the last 72 hours Thyroid Function Tests: No results found for this basename: TSH, T4TOTAL, FREET3, T3FREE, THYROIDAB,  in the last 72 hours Anemia Panel: No results found for this basename: VITAMINB12, FOLATE, FERRITIN, TIBC, IRON, RETICCTPCT,  in the last 72 hours Coag Panel:   Lab Results  Component Value Date   INR 1.28 10/20/2013   INR 1.50* 10/19/2013   INR 1.70* 10/18/2013    RADIOLOGY: Ct Abdomen Pelvis Wo Contrast  10/10/2013   CLINICAL DATA:  Vomiting and diarrhea for 3-4 days, generalized weakness, on Coumadin, history is CHF  EXAM: CT ABDOMEN AND PELVIS WITHOUT CONTRAST  TECHNIQUE: Multidetector CT imaging of the abdomen and pelvis was performed following the standard protocol without intravenous contrast.  COMPARISON:  Lumbar spine CT -06/15/2009  FINDINGS: The lack of intravenous contrast limits the ability to evaluate solid abdominal organs.  There is mild nodularity of the hepatic contour, nonspecific though could be seen in the setting of cirrhosis. This finding is associated with small volume of intra-abdominal ascites. Post cholecystectomy. There is apparent fusiform ectasia of the origin of the portal vein regional to the confluence of the superior mesenteric and splenic veins measuring approximately 2.8  cm in greatest oblique axial dimension (image 31, series 2).  The bilateral kidneys are slightly atrophic compatible with provided history of renal insufficiency. No discrete renal stones. No urinary obstruction or perinephric stranding. Normal noncontrast appearance of the bilateral adrenal glands. There is a crescentic approximately 1.6 x 1.4 cm presumed partially calcified splenic artery aneurysm (image 26, series 2). Additional smaller presumed splenic aneurysms are seen about the splenic hilum.  Scattered colonic diverticulosis without evidence of diverticulitis. The colon is largely decompressed. The bowel is otherwise  normal in course and caliber without discrete area of wall thickening or evidence of obstruction. The appendix is not definitely identified, however there is no definitive inflammatory change within the right lower abdominal quadrant. No pneumoperitoneum, pneumatosis or portal venous gas.  Atherosclerotic plaque within a normal caliber abdominal aorta. Scattered shotty retroperitoneal lymph nodes are individually not enlarged by size criteria. No retroperitoneal, mesenteric, pelvic or inguinal lymphadenopathy on this noncontrast examination.  The pelvic organs her expectantly atrophic. No discrete adnexal lesion.  Limited visualization of the lower thorax demonstrates minimal subsegmental atelectasis within the caudal aspect of the right middle lobe in an left lower lobe. No focal airspace opacities. No pleural effusion.  Cardiomegaly. Ventricular pacer leads are seen within the right atrium, ventricle and coronary sinus. No pericardial effusion.  No acute or aggressive osseous abnormalities. There is mild straightening expected lumbar lordosis. Grossly unchanged mild (approximately 25%) compression deformity involving the anterior aspect of the superior endplate of the L4 vertebral body. Grossly unchanged mild (approximately 5 mm) of retrolisthesis of L4 upon L5.  IMPRESSION: 1. Colonic diverticulosis without definite evidence of diverticulitis on this noncontrast examination. The colon is decompressed without definite evidence of obstruction. 2. Mild nodularity of the hepatic contour suggestive of cirrhosis. This finding is associated with small volume intra-abdominal ascites. 3. Incidental note made of an approximately 1.6 cm splenic artery aneurysm - this is of doubtful clinical concern in this postmenopausal patient. 4. Suspected fusiform ectasia of the origin of the portal vein, measuring approximately 1.8 cm, incompletely evaluated on this noncontrast examination and of uncertain clinical significant. 5.  Cardiomegaly.   Electronically Signed   By: Sandi Mariscal M.D.   On: 10/10/2013 15:48   Dg Chest 2 View  10/10/2013   CLINICAL DATA:  Cough.  EXAM: CHEST  2 VIEW  COMPARISON:  November 29, 2007.  FINDINGS: Stable cardiomegaly. Status post mitral valve replacement. Left-sided pacemaker is unchanged in position. No pneumothorax or pleural effusion is noted. No acute pulmonary disease is noted. Bony thorax is intact.  IMPRESSION: No acute cardiopulmonary abnormality seen.   Electronically Signed   By: Sabino Dick M.D.   On: 10/10/2013 17:04   Dg Chest Port 1 View  10/15/2013   CLINICAL DATA:  Line placement.  EXAM: PORTABLE CHEST - 1 VIEW  COMPARISON:  10/10/2013.  FINDINGS: PICC line noted with tip projected over superior vena cava. Cardiac pacer noted with lead tips in stable position. Prior median sternotomy. Mitral valve replacement. Cardiomegaly. No CHF. Pulmonary vascularity is normal. Tiny left pleural effusion cannot be completely excluded. Right apical interstitial prominence most consistent with interstitial fibrosis. A component of underlying bronchiectasis may be present.  IMPRESSION: 1. PICC line with tip in good anatomic position. 2. Mitral valve replacement. Median sternotomy. Cardiac pacer. Cardiomegaly. No pulmonary venous congestion. 3. Small left pleural effusion cannot be excluded . 4. Right apical interstitial scarring. A component of bronchiectasis may be present.   Electronically Signed   By: Marcello Moores  Register   On: 10/15/2013 12:59   ASSESSMENT AND PLAN:  1. 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 Volume status better, creatinine stable. CVP 3, weight down. Net negative 19L>  She has LE still but only trace in RLE.  LLE swollen more I suspect from her gout flare. - Torsemide started yesterday - Dobutamine d/c'd. Restarted digoxin.  - EF seems to be lower than past but echo difficult. Had no significant CAD on cath about 10 years ago. Would hold off on  LHC for now with AKI.  - starting carvedilol today 2. AKI on CKD: Creatinine stable.  3. Atrial fibrillation: Chronic. Paced rhythm, rate stable despite inotropes. Start warfarin when stable from surgical standpoint, Warfarin restarted 4. Cirrhosis: Possibly due to right heart failure/hepatic congestion. Only mild amount of ascites noted on CT.  5. Peri-rectal abscess: s/p surgery.  6. MV repair: Stable without significant MR on last echo.  7. Gout pain: Better on colchicine. LLE still swollen from gout 8. Anemia: Post-op. Hemoglobin higher today.  9.  Hypokalemia - replete  Plan for ? [place] for Rehab - please have her followup with me in 7-10 days    Sueanne Margarita, MD  10/20/2013  8:37 AM

## 2013-10-20 NOTE — Discharge Summary (Signed)
Physician Discharge Summary  [name] [id number] DOB: [birth year] DOA: 10/10/2013  PCP: No primary provider on file.  Admit date: 10/10/2013 Discharge date: 10/20/2013  Time spent: >35 minutes  Recommendations for Outpatient Follow-up:  F/u with PCP in 1 week post rehab F/u with surgery in 1-2 week  F/u with HF clinic  Discharge Diagnoses:  Principal Problem:   Perirectal abscess Active Problems:   Chronic anticoagulation   Liver cirrhosis   Thrombocytopenia   Nausea and vomiting   Acute on chronic combined systolic and diastolic congestive heart failure   UTI (urinary tract infection)   CKD (chronic kidney disease) stage 3, GFR 30-59 ml/min   Anemia   PAF (paroxysmal atrial fibrillation)   Biventricular cardiac pacemaker in situ   Discharge Condition: stable   Diet recommendation: heart healthy   Filed Weights   10/18/13 0500 10/18/13 0800 10/19/13 0425  Weight: 103.5 kg (228 lb 2.8 oz) 102.9 kg (226 lb 13.7 oz) 98.8 kg (217 lb 13 oz)    History of present illness:  3 year CHF EF 45-50% by ECO 2007, history of mitral valve annuloplasty, A fib, Pacemaker, on chronic anticoagulation with Coumadin, presents to the emergency room with a chief complaint of nausea vomiting and feeling weak for the past 3 days prior to admission. Patient was found to have acute on chronic systolic heart failure with decrease EF at 35 to 40 %, worsening renal function, and perirectal abscess. Patient under went I and D of perirectal abscess on 11/28   Hospital Course:  1. Left labia, perirectal Abscess with fistulization to the vagina: S/P I and D 11-28. -clinically improved on Vancomycin/Zosyn day 7. Surgery sign off. Follow wound culture results neg. Outpatient surgery f/u in 1-2 weeks 2. Acute on chronic renal Failure: Last cr per records at 1.3. Remained stable on on IV lasix. 3. Acute on chronic CHF systolic HF, 2D echo shows further decrease in her EF to 35-40% and anteroseptal  hypokinesis.  -duresed well on lasix+dobutamine; I/O neg 19L; changed PO diuresis,resume BB, dig; outpatient follow up in 1-2 weeks 4. Acute blood loss anemia on Chronic anemia: post surgery. Tf if Hg < 7. 0  5. Atrial fibrillation; PPM; S/p mitral valve annuloplasty coumadin was on hold due to surgery;  -resume coumadin 12/3; no s/s of bleeding; cont monitoring  6. Urinary tract infection; Urine culture grew multiple bacterial morphotype. Treated.  7. Liver cirrhosis on imaging; patient without known history of any liver disease. She has ascites, elevated total bilirubin, thrombocytopenia. viral hepatitis panel negative. GI consulted. Dr Percell Miller think cirrhosis could be due to NASH. No further work up at this point.  8. Diarrhea; Resolved. C diff negative.   Patient is being d/c to SNF  Procedures:  I&D 11/28 (i.e. Studies not automatically included, echos, thoracentesis, etc; not x-rays)  Consultations:  Surgery  cardiology  Discharge Exam: Filed Vitals:   10/20/13 0400  BP: 133/45  Pulse: 70  Temp: 98.9 F (37.2 C)  Resp: 18    General: alert Cardiovascular: s1,s2 rrr Respiratory: CTA BL  Discharge Instructions  Discharge Orders   Future Appointments Provider Department Dept Phone   10/31/2013 9:15 AM [facility] [place] [facility] [phone]   01/31/2014 9:15 AM Sueanne Margarita, MD [place] [phone]   Future Orders Complete By Expires   Diet - low sodium heart healthy  As directed    Discharge instructions  As directed    Comments:  Please follow up with primary care doctor in 1 week   Increase activity slowly  As directed        Medication List    STOP taking these medications       furosemide 20 MG tablet  Commonly known as:  LASIX      TAKE these medications       acetaminophen 500 MG tablet  Commonly known as:  TYLENOL  Take 500 mg by mouth every 6 (six) hours as needed for mild pain.      albuterol (5 MG/ML) 0.5% nebulizer solution  Commonly known as:  PROVENTIL  Take 0.5 mLs (2.5 mg total) by nebulization every 6 (six) hours as needed for wheezing or shortness of breath.     beta carotene w/minerals tablet  Take 1 tablet by mouth daily.     carvedilol 12.5 MG tablet  Commonly known as:  COREG  Take 12.5 mg by mouth 2 (two) times daily with a meal.     cetirizine 10 MG tablet  Commonly known as:  ZYRTEC  Take 10 mg by mouth daily.     colchicine 0.6 MG tablet  Take 0.6 mg by mouth daily.     digoxin 0.125 MG tablet  Commonly known as:  LANOXIN  Take 0.0625 mg by mouth daily.     ferrous sulfate 325 (65 FE) MG tablet  Take 325 mg by mouth daily with breakfast.     NOSTRILLA CONGESTION RELIEF NA  Place 1 spray into the nose daily as needed (for congestion).     potassium chloride SA 20 MEQ tablet  Commonly known as:  K-DUR,KLOR-CON  Take 20 mEq by mouth daily.     torsemide 20 MG tablet  Commonly known as:  DEMADEX  Take 3 tablets (60 mg total) by mouth daily.     traMADol 50 MG tablet  Commonly known as:  ULTRAM  Take 0.5 tablets (25 mg total) by mouth every 6 (six) hours as needed (headache).     warfarin 5 MG tablet  Commonly known as:  COUMADIN  Take 2.5-5 mg by mouth daily. Take 2.5mg  daily, except take 5mg  on Mondays.       No Known Allergies     Follow-up Information   Follow up with [facility], [place]. Schedule an appointment as soon as possible for a visit in 2 weeks.   Specialty:  General Surgery   Contact information:   [address] [place] [address] [phone]       Follow up with Adin Hector, MD In 1 week.   Specialty:  General Surgery   Contact information:   [place] [address] [phone]       Follow up with [facility] In 1 week.   Specialty:  Cardiology   Contact information:   [address] [id number] [place] [address] [phone]       The results of significant diagnostics from this hospitalization (including imaging, microbiology, ancillary and laboratory) are listed below for reference.    Significant Diagnostic Studies: Ct Abdomen Pelvis Wo Contrast  10/10/2013   CLINICAL DATA:  Vomiting and diarrhea for 3-4 days, generalized weakness, on Coumadin, history is CHF  EXAM: CT ABDOMEN AND PELVIS WITHOUT CONTRAST  TECHNIQUE: Multidetector CT imaging of the abdomen and pelvis was performed following the standard protocol without intravenous contrast.  COMPARISON:  Lumbar spine CT -06/15/2009  FINDINGS: The  lack of intravenous contrast limits the ability to evaluate solid abdominal organs.  There is mild nodularity of the hepatic contour, nonspecific though could be seen in the setting of cirrhosis. This finding is associated with small volume of intra-abdominal ascites. Post cholecystectomy. There is apparent fusiform ectasia of the origin of the portal vein regional to the confluence of the superior mesenteric and splenic veins measuring approximately 2.8 cm in greatest oblique axial dimension (image 31, series 2).  The bilateral kidneys are slightly atrophic compatible with provided history of renal insufficiency. No discrete renal stones. No urinary obstruction or perinephric stranding. Normal noncontrast appearance of the bilateral adrenal glands. There is a crescentic approximately 1.6 x 1.4 cm presumed partially calcified splenic artery aneurysm (image 26, series 2). Additional smaller presumed splenic aneurysms are seen about the splenic hilum.  Scattered colonic diverticulosis without evidence of diverticulitis. The colon is largely decompressed. The bowel is otherwise normal in course and caliber without discrete area of wall thickening or evidence of obstruction. The appendix is not definitely identified, however there is no definitive inflammatory change within the  right lower abdominal quadrant. No pneumoperitoneum, pneumatosis or portal venous gas.  Atherosclerotic plaque within a normal caliber abdominal aorta. Scattered shotty retroperitoneal lymph nodes are individually not enlarged by size criteria. No retroperitoneal, mesenteric, pelvic or inguinal lymphadenopathy on this noncontrast examination.  The pelvic organs her expectantly atrophic. No discrete adnexal lesion.  Limited visualization of the lower thorax demonstrates minimal subsegmental atelectasis within the caudal aspect of the right middle lobe in an left lower lobe. No focal airspace opacities. No pleural effusion.  Cardiomegaly. Ventricular pacer leads are seen within the right atrium, ventricle and coronary sinus. No pericardial effusion.  No acute or aggressive osseous abnormalities. There is mild straightening expected lumbar lordosis. Grossly unchanged mild (approximately 25%) compression deformity involving the anterior aspect of the superior endplate of the L4 vertebral body. Grossly unchanged mild (approximately 5 mm) of retrolisthesis of L4 upon L5.  IMPRESSION: 1. Colonic diverticulosis without definite evidence of diverticulitis on this noncontrast examination. The colon is decompressed without definite evidence of obstruction. 2. Mild nodularity of the hepatic contour suggestive of cirrhosis. This finding is associated with small volume intra-abdominal ascites. 3. Incidental note made of an approximately 1.6 cm splenic artery aneurysm - this is of doubtful clinical concern in this postmenopausal patient. 4. Suspected fusiform ectasia of the origin of the portal vein, measuring approximately 1.8 cm, incompletely evaluated on this noncontrast examination and of uncertain clinical significant. 5. Cardiomegaly.   Electronically Signed   By: Sandi Mariscal M.D.   On: 10/10/2013 15:48   Dg Chest 2 View  10/10/2013   CLINICAL DATA:  Cough.  EXAM: CHEST  2 VIEW  COMPARISON:  November 29, 2007.  FINDINGS:  Stable cardiomegaly. Status post mitral valve replacement. Left-sided pacemaker is unchanged in position. No pneumothorax or pleural effusion is noted. No acute pulmonary disease is noted. Bony thorax is intact.  IMPRESSION: No acute cardiopulmonary abnormality seen.   Electronically Signed   By: Sabino Dick M.D.   On: 10/10/2013 17:04   Dg Chest Port 1 View  10/15/2013   CLINICAL DATA:  Line placement.  EXAM: PORTABLE CHEST - 1 VIEW  COMPARISON:  10/10/2013.  FINDINGS: PICC line noted with tip projected over superior vena cava. Cardiac pacer noted with lead tips in stable position. Prior median sternotomy. Mitral valve replacement. Cardiomegaly. No CHF. Pulmonary vascularity is normal. Tiny left pleural effusion cannot be completely excluded. Right  apical interstitial prominence most consistent with interstitial fibrosis. A component of underlying bronchiectasis may be present.  IMPRESSION: 1. PICC line with tip in good anatomic position. 2. Mitral valve replacement. Median sternotomy. Cardiac pacer. Cardiomegaly. No pulmonary venous congestion. 3. Small left pleural effusion cannot be excluded . 4. Right apical interstitial scarring. A component of bronchiectasis may be present.   Electronically Signed   By: Marcello Moores  Register   On: 10/15/2013 12:59    Microbiology: Recent Results (from the past 240 hour(s))  URINE CULTURE     Status: None   Collection Time    10/10/13  2:32 PM      Result Value Range Status   Specimen Description URINE, RANDOM   Final   Special Requests NONE   Final   Culture  Setup Time     Final   Value: 10/10/2013 20:40     Performed at [facility]     Final   Value: >=100,000 COLONIES/ML     Performed at [facility]   Culture     Final   Value: Multiple bacterial morphotypes present, none predominant. Suggest appropriate recollection if clinically indicated.     Performed at [facility]   Report Status 10/12/2013 FINAL   Final   MRSA PCR SCREENING     Status: None   Collection Time    10/10/13  6:24 PM      Result Value Range Status   MRSA by PCR NEGATIVE  NEGATIVE Final   Comment:            The GeneXpert MRSA Assay (FDA     approved for NASAL specimens     only), is one component of a     comprehensive MRSA colonization     surveillance program. It is not     intended to diagnose MRSA     infection nor to guide or     monitor treatment for     MRSA infections.  CLOSTRIDIUM DIFFICILE BY PCR     Status: None   Collection Time    10/11/13  8:38 PM      Result Value Range Status   C difficile by pcr NEGATIVE  NEGATIVE Final  URINE CULTURE     Status: None   Collection Time    10/14/13  8:47 AM      Result Value Range Status   Specimen Description URINE, CLEAN CATCH   Final   Special Requests A   Final   Culture  Setup Time     Final   Value: 10/14/2013 10:02     Performed at [facility]     Final   Value: NO GROWTH     Performed at [facility]   Culture     Final   Value: NO GROWTH     Performed at [facility]   Report Status 10/15/2013 FINAL   Final  ANAEROBIC CULTURE     Status: None   Collection Time    10/14/13 11:41 AM      Result Value Range Status   Specimen Description ABSCESS   Final   Special Requests PERINEAL ABSCESS   Final   Gram Stain     Final   Value: NO WBC SEEN     NOPEPI NO ORGANISMS SEEN     Performed at [facility]   Culture     Final   Value:  NO ANAEROBES ISOLATED; CULTURE IN PROGRESS FOR 5 DAYS     Performed at [facility]   Report Status PENDING   Incomplete  CULTURE, ROUTINE-ABSCESS     Status: None   Collection Time    10/14/13 11:41 AM      Result Value Range Status   Specimen Description ABSCESS   Final   Special Requests PERINEAL ABSCESS   Final   Gram Stain     Final   Value: NO WBC SEEN     NOPEI NO ORGANISMS SEEN     Performed at [facility]   Culture     Final   Value: NO GROWTH 3  DAYS     Performed at [facility]   Report Status 10/18/2013 FINAL   Final     Labs: Basic Metabolic Panel:  Recent Labs Lab 10/16/13 0500 10/17/13 0500 10/18/13 0500 10/19/13 0550 10/20/13 0500  NA 142 143 141 139 139  K 3.1* 3.8 3.5 3.5 3.4*  CL 105 105 102 98 97  CO2 24 26 27 30 25   GLUCOSE 108* 102* 92 92 93  BUN 34* 25* 19 15 17   CREATININE 1.28* 1.33* 1.31* 1.28* 1.30*  CALCIUM 7.3* 7.2* 7.0* 7.2* 7.5*   Liver Function Tests: No results found for this basename: AST, ALT, ALKPHOS, BILITOT, PROT, ALBUMIN,  in the last 168 hours No results found for this basename: LIPASE, AMYLASE,  in the last 168 hours No results found for this basename: AMMONIA,  in the last 168 hours CBC:  Recent Labs Lab 10/16/13 1044 10/17/13 0500 10/18/13 0500 10/19/13 0550 10/20/13 0500  WBC 8.1 8.7 10.3 9.2 8.8  HGB 7.2* 7.2* 7.1* 7.3* 7.6*  HCT 22.1* 22.2* 22.6* 23.1* 24.7*  MCV 85.3 85.7 86.9 86.2 85.8  PLT 193 181 189 206 227   Cardiac Enzymes: No results found for this basename: CKTOTAL, CKMB, CKMBINDEX, TROPONINI,  in the last 168 hours BNP: BNP (last 3 results)  Recent Labs  10/10/13 1634 10/13/13 1113  PROBNP 6217.0* 8553.0*   CBG: No results found for this basename: GLUCAP,  in the last 168 hours     Signed:  Rowe Clack N  [facility] 10/20/2013, 7:44 AM

## 2013-10-21 ENCOUNTER — Non-Acute Institutional Stay (SKILLED_NURSING_FACILITY): Payer: Medicare Other | Admitting: Adult Health

## 2013-10-21 DIAGNOSIS — I509 Heart failure, unspecified: Secondary | ICD-10-CM

## 2013-10-21 DIAGNOSIS — D649 Anemia, unspecified: Secondary | ICD-10-CM

## 2013-10-21 DIAGNOSIS — M109 Gout, unspecified: Secondary | ICD-10-CM

## 2013-10-21 DIAGNOSIS — I4891 Unspecified atrial fibrillation: Secondary | ICD-10-CM

## 2013-10-21 DIAGNOSIS — I5042 Chronic combined systolic (congestive) and diastolic (congestive) heart failure: Secondary | ICD-10-CM

## 2013-10-21 DIAGNOSIS — K7460 Unspecified cirrhosis of liver: Secondary | ICD-10-CM

## 2013-10-21 DIAGNOSIS — I482 Chronic atrial fibrillation, unspecified: Secondary | ICD-10-CM

## 2013-10-21 DIAGNOSIS — K612 Anorectal abscess: Secondary | ICD-10-CM

## 2013-10-21 DIAGNOSIS — K611 Rectal abscess: Secondary | ICD-10-CM

## 2013-10-26 ENCOUNTER — Non-Acute Institutional Stay (SKILLED_NURSING_FACILITY): Payer: Medicare Other | Admitting: Internal Medicine

## 2013-10-26 DIAGNOSIS — D62 Acute posthemorrhagic anemia: Secondary | ICD-10-CM

## 2013-10-26 DIAGNOSIS — K611 Rectal abscess: Secondary | ICD-10-CM

## 2013-10-26 DIAGNOSIS — I5042 Chronic combined systolic (congestive) and diastolic (congestive) heart failure: Secondary | ICD-10-CM

## 2013-10-26 DIAGNOSIS — K612 Anorectal abscess: Secondary | ICD-10-CM

## 2013-10-26 DIAGNOSIS — I509 Heart failure, unspecified: Secondary | ICD-10-CM

## 2013-10-27 ENCOUNTER — Ambulatory Visit: Payer: Medicare Other | Admitting: Cardiology

## 2013-10-28 ENCOUNTER — Encounter: Payer: Self-pay | Admitting: General Surgery

## 2013-10-28 ENCOUNTER — Telehealth (INDEPENDENT_AMBULATORY_CARE_PROVIDER_SITE_OTHER): Payer: Self-pay | Admitting: General Surgery

## 2013-10-28 DIAGNOSIS — I4821 Permanent atrial fibrillation: Secondary | ICD-10-CM

## 2013-10-28 HISTORY — DX: Permanent atrial fibrillation: I48.21

## 2013-10-28 NOTE — Telephone Encounter (Signed)
Spoke with pt's daughter and explained that we are having to reschedule her mothers appt form 11/04/13 to 11/07/13 at 11:50.  They explained that this would be fine.

## 2013-10-31 ENCOUNTER — Encounter: Payer: Self-pay | Admitting: Cardiology

## 2013-10-31 ENCOUNTER — Ambulatory Visit (INDEPENDENT_AMBULATORY_CARE_PROVIDER_SITE_OTHER): Payer: Medicare Other | Admitting: Cardiology

## 2013-10-31 ENCOUNTER — Ambulatory Visit (INDEPENDENT_AMBULATORY_CARE_PROVIDER_SITE_OTHER): Payer: Medicare Other | Admitting: Pharmacist

## 2013-10-31 VITALS — BP 124/60 | HR 75 | Ht 63.0 in | Wt 206.0 lb

## 2013-10-31 DIAGNOSIS — R42 Dizziness and giddiness: Secondary | ICD-10-CM

## 2013-10-31 DIAGNOSIS — Z950 Presence of cardiac pacemaker: Secondary | ICD-10-CM

## 2013-10-31 DIAGNOSIS — I509 Heart failure, unspecified: Secondary | ICD-10-CM

## 2013-10-31 DIAGNOSIS — I951 Orthostatic hypotension: Secondary | ICD-10-CM

## 2013-10-31 DIAGNOSIS — I4891 Unspecified atrial fibrillation: Secondary | ICD-10-CM

## 2013-10-31 DIAGNOSIS — I5042 Chronic combined systolic (congestive) and diastolic (congestive) heart failure: Secondary | ICD-10-CM

## 2013-10-31 DIAGNOSIS — Z7901 Long term (current) use of anticoagulants: Secondary | ICD-10-CM

## 2013-10-31 DIAGNOSIS — I059 Rheumatic mitral valve disease, unspecified: Secondary | ICD-10-CM

## 2013-10-31 DIAGNOSIS — I10 Essential (primary) hypertension: Secondary | ICD-10-CM

## 2013-10-31 DIAGNOSIS — I482 Chronic atrial fibrillation, unspecified: Secondary | ICD-10-CM

## 2013-10-31 DIAGNOSIS — I5032 Chronic diastolic (congestive) heart failure: Secondary | ICD-10-CM | POA: Insufficient documentation

## 2013-10-31 HISTORY — DX: Orthostatic hypotension: I95.1

## 2013-10-31 LAB — BASIC METABOLIC PANEL
BUN: 39 mg/dL — ABNORMAL HIGH (ref 6–23)
CO2: 28 mEq/L (ref 19–32)
Creatinine, Ser: 1.7 mg/dL — ABNORMAL HIGH (ref 0.4–1.2)
GFR: 29.55 mL/min — ABNORMAL LOW (ref >60.00)
Potassium: 3.7 mEq/L (ref 3.5–5.1)

## 2013-10-31 LAB — POCT INR: INR: 2.6

## 2013-10-31 MED ORDER — CARVEDILOL 6.25 MG PO TABS: 6.2500 mg | ORAL_TABLET | Freq: Two times a day (BID) | ORAL | Status: DC

## 2013-10-31 MED ORDER — CARVEDILOL 6.25 MG PO TABS: 12.5000 mg | ORAL_TABLET | Freq: Two times a day (BID) | ORAL | Status: DC

## 2013-10-31 NOTE — Addendum Note (Signed)
Addended by: Lily Kocher on: 10/31/2013 10:43 AM   Modules accepted: Orders

## 2013-10-31 NOTE — Patient Instructions (Signed)
Your physician has recommended you make the following change in your medication: Decrease Coreg to 6.25 Twice a day. A new Rx has been sent in for you and you should be able to pick up today  Your physician recommends that you go to the lab today for a digoxin level and a BMET panel

## 2013-10-31 NOTE — Progress Notes (Signed)
[facility] [place], [place]  [address] Phone: [phone] Fax:  [phone]  Date:  10/31/2013   ID:  [name], DOB [birth year], MRN [id number]  PCP:  No primary provider on file.  Cardiologist:  Vonna Drafts      History of Present Illness: [name] is a 79 y.o. female with a history of chronic combined systolic/diastolic CHF, severe MR s/p MV repair, chronic atrial fibrillation, BiV PPM and CKD who was recently in the hospital with N/V, weakness and diarrhea, acute on chronic CHF and perirectal abcess who presents back today for followup of her CHF.  During her hospital stay her EF was noted to have declined some to 35-40% and she was started on IV dobutamine/dopamine for significant volume overload and diuresed very well.  She had significant blood loss anemia after her surgery for repair of abcess and coumadin was held for a while.  She lost a total of 30 pounds during hospitalization.  She is doing well.  She has some mild DOE but no SOB at rest.  Her LE edema has resolved.  She has had a few dizzy spells with low BP.  She denies any chest pain or palpitations.   Wt Readings from Last 3 Encounters:  10/31/13 206 lb (93.441 kg)  10/19/13 217 lb 13 oz (98.8 kg)  10/19/13 217 lb 13 oz (98.8 kg)     Past Medical History  Diagnosis Date  . CHF (congestive heart failure)     a. Chronic combined systolic/diastolic CHF EF 123456 in 09/2013.  [name] Hypertension   . Mitral valve disorder     a. Severe MR s/p repair 2003 (28 mm annuloplasty ring and and oversew of LAA). No CAD by cath at that time.  . Atrial fibrillation     a. Previously failed DCCV /amio and Tikosyn  . Pacemaker     a. 2003: post-op afib after MR repair then symptomatic bradycardia requiring pacemaker. b. Upgrade to Medtronic Bi-V Pacemaker 2007.  [name] Cirrhosis     a. Newly recognized 09/2013 - by CT.  . CKD (chronic kidney disease)   . NSVT (nonsustained ventricular tachycardia)     a.  Isolated event during 09/2007 adm.  . Pulmonary hypertension, mild     secondary to mild/mod LV dysfunction(EF 40%)  . Diastolic dysfunction   . Brady-tachy syndrome     s/p permanent pacemaker placement  . Spinal stenosis     shes recieved epidural steroid injections in the past    Current Outpatient Prescriptions  Medication Sig Dispense Refill  . acetaminophen (TYLENOL) 500 MG tablet Take 500 mg by mouth every 6 (six) hours as needed for mild pain.      [name] albuterol (PROVENTIL) (5 MG/ML) 0.5% nebulizer solution Take 0.5 mLs (2.5 mg total) by nebulization every 6 (six) hours as needed for wheezing or shortness of breath.  20 mL  12  . beta carotene w/minerals (OCUVITE) tablet Take 1 tablet by mouth daily.      . carvedilol (COREG) 12.5 MG tablet Take 12.5 mg by mouth 2 (two) times daily with a meal.      . cetirizine (ZYRTEC) 10 MG tablet Take 10 mg by mouth daily.      . colchicine 0.6 MG tablet Take 0.6 mg by mouth daily.      . digoxin (LANOXIN) 0.125 MG tablet Take 0.0625 mg by mouth daily.      . ferrous sulfate 325 (65 FE) MG  tablet Take 325 mg by mouth daily with breakfast.      . Oxymetazoline HCl (NOSTRILLA CONGESTION RELIEF NA) Place 1 spray into the nose daily as needed (for congestion).      . potassium chloride SA (K-DUR,KLOR-CON) 20 MEQ tablet Take 20 mEq by mouth daily.      [name] torsemide (DEMADEX) 20 MG tablet Take 3 tablets (60 mg total) by mouth daily.      . traMADol (ULTRAM) 50 MG tablet Take 0.5 tablets (25 mg total) by mouth every 6 (six) hours as needed (headache).  30 tablet  0  . warfarin (COUMADIN) 5 MG tablet Take 2.5-5 mg by mouth daily. Take 2.5mg  daily, except take 5mg  on Mondays.       No current facility-administered medications for this visit.    Allergies:   No Known Allergies  Social History:  The patient  reports that she quit smoking about 42 years ago. She does not have any smokeless tobacco history on file. She reports that she drinks alcohol. She  reports that she does not use illicit drugs.   Family History:  [name] family history includes Other in her mother; Pancreatic cancer in her father.   ROS:  Please see the history of present illness.      All other systems reviewed and negative.   PHYSICAL EXAM: VS:  BP 124/60  Pulse 75  Ht 5\' 3"  (1.6 m)  Wt 206 lb (93.441 kg)  BMI 36.50 kg/m2  LMP 10/10/2013 Well nourished, well developed, in no acute distress HEENT: normal Neck: no JVD Cardiac:  normal S1, S2; RRR; no murmur Lungs:  clear to auscultation bilaterally, no wheezing, rhonchi or rales Abd: soft, nontender, no hepatomegaly Ext: no edema Skin: warm and dry Neuro:  CNs 2-12 intact, no focal abnormalities noted       ASSESSMENT AND PLAN:  1. Chronic combined systolic/diastolic CHF - stable  - continue Torsemide/beta blocker/dig   - check dig level and BMET 2. Chronic atrial fibrillation on chronic anticoagulation - rate controlled  - continue Warfarin 3. Nonischemic DCM EF 35-40% 4.   HTN well controlled with some intermittent dizzy spells at Rehab  - decrease Coreg to 6.25mg  BID due to intermittent low BP 5.   Severe MR s/p MV repair 6.   CKD  Followup with me in 3 months   Signed, [name], MD 10/31/2013 10:06 AM

## 2013-11-01 ENCOUNTER — Telehealth: Payer: Self-pay | Admitting: Cardiology

## 2013-11-01 MED ORDER — TORSEMIDE 20 MG PO TABS: ORAL_TABLET | ORAL | Status: DC

## 2013-11-01 NOTE — Telephone Encounter (Signed)
Message copied by Ulla Potash H on Tue Nov 01, 2013 11:35 AM ------      Message from: [name] R      Created: Mon Oct 31, 2013  9:37 PM       Increased creatinine - please have patient decrease Torsemide to 20mg   2 tablets daily and recheck BMET on 12/19 ------

## 2013-11-01 NOTE — Telephone Encounter (Signed)
See result note for more info. Meds updated. [facility] aware of med change and they will check Bmet on 11/04/13.

## 2013-11-03 ENCOUNTER — Encounter (INDEPENDENT_AMBULATORY_CARE_PROVIDER_SITE_OTHER): Payer: Medicare Other | Admitting: General Surgery

## 2013-11-04 ENCOUNTER — Encounter (INDEPENDENT_AMBULATORY_CARE_PROVIDER_SITE_OTHER): Payer: Medicare Other | Admitting: General Surgery

## 2013-11-07 ENCOUNTER — Ambulatory Visit (INDEPENDENT_AMBULATORY_CARE_PROVIDER_SITE_OTHER): Payer: Medicare Other | Admitting: General Surgery

## 2013-11-07 ENCOUNTER — Encounter (INDEPENDENT_AMBULATORY_CARE_PROVIDER_SITE_OTHER): Payer: Self-pay | Admitting: General Surgery

## 2013-11-07 ENCOUNTER — Encounter (INDEPENDENT_AMBULATORY_CARE_PROVIDER_SITE_OTHER): Payer: Self-pay

## 2013-11-07 VITALS — BP 124/64 | HR 68 | Temp 98.6°F | Resp 14 | Ht 63.0 in | Wt 204.6 lb

## 2013-11-07 DIAGNOSIS — M109 Gout, unspecified: Secondary | ICD-10-CM

## 2013-11-07 DIAGNOSIS — Z09 Encounter for follow-up examination after completed treatment for conditions other than malignant neoplasm: Secondary | ICD-10-CM

## 2013-11-07 HISTORY — DX: Gout, unspecified: M10.9

## 2013-11-07 NOTE — Progress Notes (Signed)
Patient ID: [name], female   DOB: [birth year], 79 y.o.   MRN: [id number]                       PROGRESS NOTE  DATE: 10/21/2013  FACILITY: Nursing Home Location: [facility]  LEVEL OF CARE: SNF (31)  Acute Visit  CHIEF COMPLAINT:  Follow-up hospitalization  HISTORY OF PRESENT ILLNESS: This is an 79 year old female who has been admitted to [facility] on 10/20/13 from [facility] with Perirectal abscess S/P I and D. She has been admitted for a short-term rehabilitation.  REASSESSMENT OF ONGOING PROBLEM(S):  GOUT: Patien'st  gout remains stable. Patient denies joint pan, redness, swelling or warmth. No complications reported from the medications presently being used.  ANEMIA: The anemia has been stable. The patient denies fatigue, melena or hematochezia. No complications from the medications currently being used. 12/14  hgb 7.6  ATRIAL FIBRILLATION: the patients atrial fibrillation remains stable.  The patient denies DOE, tachycardia, orthopnea, transient neurological sx, pedal edema, palpitations, & PNDs.  No complications noted from the medications currently being used.   PAST MEDICAL HISTORY : Reviewed.  No changes.  CURRENT MEDICATIONS: Reviewed per [facility]  REVIEW OF SYSTEMS:  GENERAL: no change in appetite, no fatigue, no weight changes, no fever, chills or weakness RESPIRATORY: no cough, SOB, DOE, wheezing, hemoptysis CARDIAC: no chest pain, edema or palpitations GI: no abdominal pain, diarrhea, constipation, heart burn, nausea or vomiting  PHYSICAL EXAMINATION  VS:  T98.2       P70      RR18      BP136/62    POX96 %     WT211.6 (Lb)  GENERAL: no acute distress, normal body habitus EYES: conjunctivae normal, sclerae normal, normal eye lids NECK: supple, trachea midline, no neck masses, no thyroid tenderness, no thyromegaly LYMPHATICS: no LAN in the neck, no supraclavicular LAN RESPIRATORY: breathing is even & unlabored, BS CTAB CARDIAC:  RRR, no murmur,no extra heart sounds, no edema GI: abdomen soft, normal BS, no masses, no tenderness, no hepatomegaly, +ascites GU: + serosanguinous drainage on left labia wound PSYCHIATRIC: the patient is alert & oriented to person, affect & behavior appropriate  LABS/RADIOLOGY: 10/20/13 sodium 139 potassium 3.4 glucose 93 BUN 17 creatinine 1.30 calcium 7.5 WBC 8.8 hemoglobin 7.6 hematocrit 24.7   ASSESSMENT/PLAN:  Perirectal abscess status post I&D - wound culture  Chronic kidney disease, stage III - stable  Anemia, acute blood loss - continue ferrous sulfate  Liver cirrhosis - stable  Gout - continue colchicine  Chronic combined systolic and diastolic CHF - stable; continue carvedilol, digoxin and Demadex  Atrial fibrillation - rate controlled; continue Coumadin   CPT CODE: 29562

## 2013-11-07 NOTE — Progress Notes (Signed)
Subjective:     Patient ID: [name], female   DOB: [birth year], 79 y.o.   MRN: [id number]  HPI This is an 79 year old female with multiple medical problems who was admitted to the hospital with a perirectal abscess. I took her to the operating room and did an incision and drainage of this. There was actually a perirectal abscess that had fistulized into her vagina. I opened this up in its entirety. She eventually was able to be discharged to home. She is now at a skilled nursing facility. She is undergoing dressing changes twice daily. She is slowly getting better. She has a Foley catheter in place that was placed at the facility. She returns today for followup. She is having bowel movements without difficulty Review of Systems     Objective:   Physical Exam    Wound is healing well with a nice clean pink base, Foley catheter in place, no infection Assessment:     Status post perirectal abscess incision and drainage     Plan:     They can continue twice daily dressing changes with a wet to dry dressing. Her Foley catheter can be removed from my standpoint. If she is discharged home she can undergo once daily dressing changes. It is up to her therapists and the facility when she is safe to go home. I also think he would be a good idea to have her get in a bathtub with warm water and nothing else once daily to keep this clean. I will see her back in 4 weeks.

## 2013-11-18 ENCOUNTER — Telehealth (INDEPENDENT_AMBULATORY_CARE_PROVIDER_SITE_OTHER): Payer: Self-pay

## 2013-11-18 NOTE — Telephone Encounter (Signed)
Cindy at [facility] called to verify wd care instructions from last ov. Office note from 11-07-13 reviewed with HHN to confirm bid saline moist gauze packing and cover dry dsg.

## 2013-11-27 ENCOUNTER — Inpatient Hospital Stay (HOSPITAL_COMMUNITY)
Admission: EM | Admit: 2013-11-27 | Discharge: 2013-11-30 | DRG: 312 | Disposition: A | Discharge status: Home Health | Payer: Medicare Other | Attending: Internal Medicine | Admitting: Internal Medicine

## 2013-11-27 ENCOUNTER — Other Ambulatory Visit: Payer: Self-pay

## 2013-11-27 ENCOUNTER — Other Ambulatory Visit (HOSPITAL_COMMUNITY): Payer: Medicare Other

## 2013-11-27 ENCOUNTER — Emergency Department (HOSPITAL_COMMUNITY): Payer: Medicare Other

## 2013-11-27 ENCOUNTER — Encounter (HOSPITAL_COMMUNITY): Payer: Self-pay | Admitting: Emergency Medicine

## 2013-11-27 DIAGNOSIS — Z800 Family history of malignant neoplasm of digestive organs: Secondary | ICD-10-CM

## 2013-11-27 DIAGNOSIS — R55 Syncope and collapse: Principal | ICD-10-CM | POA: Diagnosis present

## 2013-11-27 DIAGNOSIS — M109 Gout, unspecified: Secondary | ICD-10-CM | POA: Diagnosis present

## 2013-11-27 DIAGNOSIS — I4891 Unspecified atrial fibrillation: Secondary | ICD-10-CM | POA: Diagnosis present

## 2013-11-27 DIAGNOSIS — K7460 Unspecified cirrhosis of liver: Secondary | ICD-10-CM | POA: Diagnosis present

## 2013-11-27 DIAGNOSIS — Z7901 Long term (current) use of anticoagulants: Secondary | ICD-10-CM

## 2013-11-27 DIAGNOSIS — I4729 Other ventricular tachycardia: Secondary | ICD-10-CM | POA: Diagnosis not present

## 2013-11-27 DIAGNOSIS — N184 Chronic kidney disease, stage 4 (severe): Secondary | ICD-10-CM | POA: Diagnosis present

## 2013-11-27 DIAGNOSIS — N183 Chronic kidney disease, stage 3 unspecified: Secondary | ICD-10-CM

## 2013-11-27 DIAGNOSIS — I129 Hypertensive chronic kidney disease with stage 1 through stage 4 chronic kidney disease, or unspecified chronic kidney disease: Secondary | ICD-10-CM | POA: Diagnosis present

## 2013-11-27 DIAGNOSIS — Z950 Presence of cardiac pacemaker: Secondary | ICD-10-CM | POA: Diagnosis present

## 2013-11-27 DIAGNOSIS — N390 Urinary tract infection, site not specified: Secondary | ICD-10-CM

## 2013-11-27 DIAGNOSIS — I472 Ventricular tachycardia, unspecified: Secondary | ICD-10-CM | POA: Diagnosis not present

## 2013-11-27 DIAGNOSIS — I5042 Chronic combined systolic (congestive) and diastolic (congestive) heart failure: Secondary | ICD-10-CM | POA: Diagnosis present

## 2013-11-27 DIAGNOSIS — I2789 Other specified pulmonary heart diseases: Secondary | ICD-10-CM | POA: Diagnosis present

## 2013-11-27 DIAGNOSIS — Z87891 Personal history of nicotine dependence: Secondary | ICD-10-CM

## 2013-11-27 DIAGNOSIS — I951 Orthostatic hypotension: Secondary | ICD-10-CM | POA: Diagnosis present

## 2013-11-27 DIAGNOSIS — Z79899 Other long term (current) drug therapy: Secondary | ICD-10-CM

## 2013-11-27 DIAGNOSIS — I509 Heart failure, unspecified: Secondary | ICD-10-CM | POA: Diagnosis present

## 2013-11-27 DIAGNOSIS — N039 Chronic nephritic syndrome with unspecified morphologic changes: Secondary | ICD-10-CM

## 2013-11-27 DIAGNOSIS — R059 Cough, unspecified: Secondary | ICD-10-CM | POA: Diagnosis present

## 2013-11-27 DIAGNOSIS — D631 Anemia in chronic kidney disease: Secondary | ICD-10-CM | POA: Diagnosis present

## 2013-11-27 DIAGNOSIS — R42 Dizziness and giddiness: Secondary | ICD-10-CM

## 2013-11-27 DIAGNOSIS — I482 Chronic atrial fibrillation, unspecified: Secondary | ICD-10-CM

## 2013-11-27 DIAGNOSIS — R05 Cough: Secondary | ICD-10-CM | POA: Diagnosis present

## 2013-11-27 HISTORY — DX: Urinary tract infection, site not specified: N39.0

## 2013-11-27 LAB — COMPREHENSIVE METABOLIC PANEL
ALT: 17 U/L (ref 0–35)
AST: 24 U/L (ref 0–37)
Albumin: 3 g/dL — ABNORMAL LOW (ref 3.5–5.2)
Alkaline Phosphatase: 153 U/L — ABNORMAL HIGH (ref 39–117)
BUN: 25 mg/dL — AB (ref 6–23)
CALCIUM: 8 mg/dL — AB (ref 8.4–10.5)
CHLORIDE: 102 meq/L (ref 96–112)
CO2: 25 mEq/L (ref 19–32)
CREATININE: 1.46 mg/dL — AB (ref 0.50–1.10)
GFR calc non Af Amer: 32 mL/min — ABNORMAL LOW (ref >90)
GFR, EST AFRICAN AMERICAN: 37 mL/min — AB (ref >90)
GLUCOSE: 98 mg/dL (ref 70–99)
Potassium: 3.9 mEq/L (ref 3.7–5.3)
Sodium: 143 mEq/L (ref 137–147)
Total Bilirubin: 1 mg/dL (ref 0.3–1.2)
Total Protein: 7.2 g/dL (ref 6.0–8.3)

## 2013-11-27 LAB — URINALYSIS, ROUTINE W REFLEX MICROSCOPIC
BILIRUBIN URINE: NEGATIVE
Glucose, UA: NEGATIVE mg/dL
Ketones, ur: NEGATIVE mg/dL
NITRITE: NEGATIVE
PROTEIN: NEGATIVE mg/dL
Specific Gravity, Urine: 1.008 (ref 1.005–1.030)
Urobilinogen, UA: 0.2 mg/dL (ref 0.0–1.0)
pH: 5 (ref 5.0–8.0)

## 2013-11-27 LAB — CBC WITH DIFFERENTIAL/PLATELET
BASOS ABS: 0 10*3/uL (ref 0.0–0.1)
Basophils Relative: 0 % (ref 0–1)
EOS PCT: 1 % (ref 0–5)
Eosinophils Absolute: 0 10*3/uL (ref 0.0–0.7)
HEMATOCRIT: 32 % — AB (ref 36.0–46.0)
HEMOGLOBIN: 10 g/dL — AB (ref 12.0–15.0)
LYMPHS ABS: 2.4 10*3/uL (ref 0.7–4.0)
LYMPHS PCT: 29 % (ref 12–46)
MCH: 26.9 pg (ref 26.0–34.0)
MCHC: 31.3 g/dL (ref 30.0–36.0)
MCV: 86 fL (ref 78.0–100.0)
MONO ABS: 0.9 10*3/uL (ref 0.1–1.0)
MONOS PCT: 10 % (ref 3–12)
NEUTROS ABS: 5.1 10*3/uL (ref 1.7–7.7)
Neutrophils Relative %: 60 % (ref 43–77)
Platelets: 134 10*3/uL — ABNORMAL LOW (ref 150–400)
RBC: 3.72 MIL/uL — ABNORMAL LOW (ref 3.87–5.11)
RDW: 17 % — AB (ref 11.5–15.5)
WBC: 8.5 10*3/uL (ref 4.0–10.5)

## 2013-11-27 LAB — TROPONIN I: Troponin I: <0.3 ng/mL (ref <0.30)

## 2013-11-27 LAB — URINE MICROSCOPIC-ADD ON

## 2013-11-27 LAB — PROTIME-INR
INR: 2.28 — AB (ref 0.00–1.49)
Prothrombin Time: 24.4 seconds — ABNORMAL HIGH (ref 11.6–15.2)

## 2013-11-27 LAB — GLUCOSE, CAPILLARY: Glucose-Capillary: 101 mg/dL — ABNORMAL HIGH (ref 70–99)

## 2013-11-27 MED ORDER — GUAIFENESIN-DM 100-10 MG/5ML PO SYRP
5.0000 mL | ORAL_SOLUTION | ORAL | Status: DC | PRN
  Administered 2013-11-27 – 2013-11-28 (×3): 5 mL via ORAL
  Filled 2013-11-27 (×2): qty 5

## 2013-11-27 MED ORDER — SODIUM CHLORIDE 0.9 % IV SOLN
INTRAVENOUS | Status: AC
  Administered 2013-11-27: 21:00:00 via INTRAVENOUS

## 2013-11-27 MED ORDER — WARFARIN SODIUM 2.5 MG PO TABS
2.5000 mg | ORAL_TABLET | ORAL | Status: AC
  Administered 2013-11-29: 2.5 mg via ORAL
  Filled 2013-11-27: qty 1

## 2013-11-27 MED ORDER — ONDANSETRON HCL 4 MG/2ML IJ SOLN
4.0000 mg | Freq: Four times a day (QID) | INTRAMUSCULAR | Status: DC | PRN
  Administered 2013-11-28: 4 mg via INTRAVENOUS
  Filled 2013-11-27: qty 2

## 2013-11-27 MED ORDER — WARFARIN SODIUM 5 MG PO TABS
5.0000 mg | ORAL_TABLET | ORAL | Status: AC
  Administered 2013-11-28: 5 mg via ORAL
  Filled 2013-11-27: qty 1

## 2013-11-27 MED ORDER — ALBUTEROL SULFATE (5 MG/ML) 0.5% IN NEBU
2.5000 mg | INHALATION_SOLUTION | Freq: Four times a day (QID) | RESPIRATORY_TRACT | Status: DC | PRN
  Filled 2013-11-27: qty 0.5

## 2013-11-27 MED ORDER — OCUVITE PO TABS
1.0000 | ORAL_TABLET | Freq: Every evening | ORAL | Status: DC
  Administered 2013-11-27 – 2013-11-29 (×3): 1 via ORAL
  Filled 2013-11-27 (×4): qty 1

## 2013-11-27 MED ORDER — SODIUM CHLORIDE 0.9 % IJ SOLN
3.0000 mL | Freq: Two times a day (BID) | INTRAMUSCULAR | Status: DC
  Administered 2013-11-27 – 2013-11-29 (×5): 3 mL via INTRAVENOUS

## 2013-11-27 MED ORDER — ZOLPIDEM TARTRATE 5 MG PO TABS
5.0000 mg | ORAL_TABLET | Freq: Every evening | ORAL | Status: DC | PRN
  Administered 2013-11-27 – 2013-11-29 (×3): 5 mg via ORAL
  Filled 2013-11-27 (×3): qty 1

## 2013-11-27 MED ORDER — ONDANSETRON HCL 4 MG/2ML IJ SOLN
4.0000 mg | Freq: Once | INTRAMUSCULAR | Status: AC
  Administered 2013-11-27: 4 mg via INTRAVENOUS
  Filled 2013-11-27: qty 2

## 2013-11-27 MED ORDER — FERROUS SULFATE 325 (65 FE) MG PO TABS
325.0000 mg | ORAL_TABLET | Freq: Every day | ORAL | Status: DC
  Administered 2013-11-28 – 2013-11-29 (×2): 325 mg via ORAL
  Filled 2013-11-27 (×3): qty 1

## 2013-11-27 MED ORDER — POTASSIUM CHLORIDE CRYS ER 20 MEQ PO TBCR
20.0000 meq | EXTENDED_RELEASE_TABLET | Freq: Every morning | ORAL | Status: DC
  Administered 2013-11-28 – 2013-11-30 (×3): 20 meq via ORAL
  Filled 2013-11-27 (×3): qty 1

## 2013-11-27 MED ORDER — COLCHICINE 0.6 MG PO TABS
0.6000 mg | ORAL_TABLET | Freq: Every day | ORAL | Status: DC | PRN
  Administered 2013-11-28: 0.6 mg via ORAL
  Filled 2013-11-27 (×2): qty 1

## 2013-11-27 MED ORDER — TRAMADOL HCL 50 MG PO TABS
25.0000 mg | ORAL_TABLET | Freq: Four times a day (QID) | ORAL | Status: DC | PRN
  Administered 2013-11-28 (×2): 25 mg via ORAL
  Filled 2013-11-27 (×2): qty 1

## 2013-11-27 MED ORDER — ADULT MULTIVITAMIN W/MINERALS CH
1.0000 | ORAL_TABLET | Freq: Every day | ORAL | Status: DC
Start: 2013-11-28 — End: 2013-11-30
  Administered 2013-11-29 – 2013-11-30 (×2): 1 via ORAL
  Filled 2013-11-27 (×3): qty 1

## 2013-11-27 MED ORDER — DEXTROSE 5 % IV SOLN: 1.0000 g | INTRAVENOUS | Status: DC

## 2013-11-27 MED ORDER — DEXTROSE 5 % IV SOLN
1.0000 g | INTRAVENOUS | Status: DC
  Administered 2013-11-27 – 2013-11-28 (×2): 1 g via INTRAVENOUS
  Filled 2013-11-27 (×3): qty 10

## 2013-11-27 MED ORDER — WARFARIN - PHARMACIST DOSING INPATIENT
Freq: Every day | Status: DC
  Administered 2013-11-28 – 2013-11-29 (×2)

## 2013-11-27 MED ORDER — CARVEDILOL 6.25 MG PO TABS
6.2500 mg | ORAL_TABLET | Freq: Two times a day (BID) | ORAL | Status: DC
  Administered 2013-11-27 – 2013-11-30 (×6): 6.25 mg via ORAL
  Filled 2013-11-27 (×8): qty 1

## 2013-11-27 MED ORDER — DIGOXIN 0.0625 MG HALF TABLET
0.0625 mg | ORAL_TABLET | Freq: Every day | ORAL | Status: DC
  Administered 2013-11-28 – 2013-11-30 (×3): 0.0625 mg via ORAL
  Filled 2013-11-27 (×3): qty 1

## 2013-11-27 MED ORDER — ONDANSETRON HCL 4 MG PO TABS
4.0000 mg | ORAL_TABLET | Freq: Four times a day (QID) | ORAL | Status: DC | PRN
Start: 2013-11-27 — End: 2013-11-30

## 2013-11-27 NOTE — ED Notes (Signed)
Spoke with lab about Troponin- in process.

## 2013-11-27 NOTE — ED Provider Notes (Signed)
CSN: [id number]     Arrival date & time 11/27/13  1318 History   First MD Initiated Contact with Patient 11/27/13 1354     Chief Complaint  Patient presents with  . Loss of Consciousness   (Consider location/radiation/quality/duration/timing/severity/associated sxs/prior Treatment) HPI Comments: Patient is an 80 year old female past medical history significant for mitral valve disorder status post repair, pacemaker placement (Medtronic Bi-V Pacemaker 2007), CKD, pulmonary hypertension, spinal stenosis, combined chronic systolic and diastolic CHF (EF 123456 echo 09/2013), hypertension, status post surgical I&D of perirectal abscess 10-14-2013 presenting to the emergency department for a syncopal episode that occurred around noon this afternoon. Patient states she got up from her chair to walk into the kitchen to make herself something for lunch and [employer] member and she was on the ground with her daughter standing over her asking her if she was okay. Patient had no precipitating lightheadedness, dizziness, shortness of breath, chest pain, nausea, vomiting, diaphoresis, no other aura-like symptoms prior to syncope. Patient was recently just discharged from rehabilitation facility after surgery at the end of November of last year. She's had no medication changes. She is followed by Dr. Golden Hurter for cardiology. Patient has only ever had one a cardiac catheterization which was for pacemaker placement. She has not had any recent stress tests. Patient has been dealing with a mild productive cough for the last week without fevers or chills. She has not been seen or evaluated by her primary care doctor for this.  Patient is a 80 y.o. female presenting with syncope. The history is provided by the patient and a relative.  Loss of Consciousness Associated symptoms: no chest pain, no dizziness, no fever, no headaches, no nausea, no palpitations, no seizures, no shortness of breath, no vomiting and no weakness      Past Medical History  Diagnosis Date  . Mitral valve disorder     a. Severe MR s/p repair 2003 (28 mm annuloplasty ring and and oversew of LAA). No CAD by cath at that time.  . Pacemaker     a. 2003: post-op afib after MR repair then symptomatic bradycardia requiring pacemaker. b. Upgrade to Medtronic Bi-V Pacemaker 2007.  [name] Cirrhosis     a. Newly recognized 09/2013 - by CT.  . CKD (chronic kidney disease)   . Pulmonary hypertension, mild     secondary to mild/mod LV dysfunction(EF 40%)  . Diastolic dysfunction   . Spinal stenosis     shes recieved epidural steroid injections in the past  . Chronic combined systolic and diastolic CHF (congestive heart failure)     EF 35-40% on echo 09/2013  . Chronic atrial fibrillation     a. Previously failed DCCV/amio/tikosyn  . NSVT (nonsustained ventricular tachycardia)     a. Isolated event during 09/2007 adm.  . Brady-tachy syndrome     s/p permanent pacemaker placement  . Hypertension    Past Surgical History  Procedure Laterality Date  . Valve replacement      sever mitral regurgitation s/p mitral valve annuloplasty ring  . Cholecystectomy    . Appendectomy    . Irrigation and debridement abscess N/A 10/14/2013    Procedure: IRRIGATION AND DEBRIDEMENT PERINEAL ABSCESS;  Surgeon: Rolm Bookbinder, MD;  Location: [facility] OR;  Service: General;  Laterality: N/A;   Family History  Problem Relation Age of Onset  . Pancreatic cancer Father   . Other Mother     Mother died at 98 with no real medical problems   History  Substance  Use Topics  . Smoking status: Former Smoker    Quit date: 10/11/1971  . Smokeless tobacco: Not on file  . Alcohol Use: 0.0 oz/week     Comment: rare   OB History   Grav Para Term Preterm Abortions TAB SAB Ect Mult Living                 Review of Systems  Constitutional: Negative for fever and chills.  Respiratory: Negative for chest tightness and shortness of breath.   Cardiovascular: Positive for  syncope. Negative for chest pain, palpitations and leg swelling.  Gastrointestinal: Negative for nausea, vomiting, abdominal pain and diarrhea.  Neurological: Positive for syncope. Negative for dizziness, tremors, seizures, speech difficulty, weakness, light-headedness, numbness and headaches.  All other systems reviewed and are negative.    Allergies  Review of patient's allergies indicates no known allergies.  Home Medications   Current Outpatient Rx  Name  Route  Sig  Dispense  Refill  . acetaminophen (TYLENOL) 500 MG tablet   Oral   Take 500 mg by mouth every 6 (six) hours as needed for mild pain.         [name] albuterol (PROVENTIL) (5 MG/ML) 0.5% nebulizer solution   Nebulization   Take 0.5 mLs (2.5 mg total) by nebulization every 6 (six) hours as needed for wheezing or shortness of breath.   20 mL   12   . beta carotene w/minerals (OCUVITE) tablet   Oral   Take 1 tablet by mouth every evening.          . carvedilol (COREG) 6.25 MG tablet   Oral   Take 1 tablet (6.25 mg total) by mouth 2 (two) times daily with a meal.   60 tablet   11   . cetirizine (ZYRTEC) 10 MG tablet   Oral   Take 10 mg by mouth daily.         . digoxin (LANOXIN) 0.125 MG tablet   Oral   Take 0.0625 mg by mouth daily.         . ferrous sulfate 325 (65 FE) MG tablet   Oral   Take 325 mg by mouth daily with supper.          . Multiple Vitamin (MULTIVITAMIN WITH MINERALS) TABS tablet   Oral   Take 1 tablet by mouth daily.         . Oxymetazoline HCl (NOSTRILLA CONGESTION RELIEF NA)   Nasal   Place 1 spray into the nose daily as needed (for congestion).         . potassium chloride SA (K-DUR,KLOR-CON) 20 MEQ tablet   Oral   Take 20 mEq by mouth every morning.          . Pseudoephedrine-APAP-DM (DAYQUIL PO)   Oral   Take 2 capsules by mouth once.         . torsemide (DEMADEX) 20 MG tablet      2 tablets po daily. (= 40mg  qd)         . traMADol (ULTRAM) 50 MG  tablet   Oral   Take 0.5 tablets (25 mg total) by mouth every 6 (six) hours as needed (headache).   30 tablet   0   . warfarin (COUMADIN) 5 MG tablet   Oral   Take 2.5-5 mg by mouth daily. Take 2.5mg  daily, except take 5mg  on Mondays.         [name] zolpidem (AMBIEN) 5 MG tablet   Oral  Take 5 mg by mouth at bedtime as needed for sleep.         . colchicine 0.6 MG tablet   Oral   Take 0.6 mg by mouth daily as needed (Gout flareups).           BP 121/55  Pulse 70  Resp 20  Ht 5\' 3"  (1.6 m)  Wt 197 lb (89.359 kg)  BMI 34.91 kg/m2  SpO2 98%  LMP 10/10/2013 Physical Exam  Constitutional: She is oriented to person, place, and time. She appears well-developed and well-nourished. No distress.  HENT:  Head: Normocephalic and atraumatic.  Right Ear: External ear normal.  Left Ear: External ear normal.  Nose: Nose normal.  Mouth/Throat: Oropharynx is clear and moist. No oropharyngeal exudate.  Eyes: Conjunctivae and EOM are normal. Pupils are equal, round, and reactive to light.  Neck: Normal range of motion. Neck supple.  Cardiovascular: Normal rate, regular rhythm, normal heart sounds and intact distal pulses.   No Carotid Bruit appreciated.   Pulmonary/Chest: Effort normal and breath sounds normal. No respiratory distress.  Abdominal: Soft. There is no tenderness.  Neurological: She is alert and oriented to person, place, and time. She has normal strength. No cranial nerve deficit or sensory deficit. Gait normal. GCS eye subscore is 4. GCS verbal subscore is 5. GCS motor subscore is 6.  No pronator drift. Bilateral heel-knee-shin intact.  Skin: Skin is warm and dry. She is not diaphoretic.    ED Course  Procedures (including critical care time) Medications - No data to display  Labs Review Labs Reviewed  CBC WITH DIFFERENTIAL - Abnormal; Notable for the following:    RBC 3.72 (*)    Hemoglobin 10.0 (*)    HCT 32.0 (*)    RDW 17.0 (*)    Platelets 134 (*)    All  other components within normal limits  COMPREHENSIVE METABOLIC PANEL - Abnormal; Notable for the following:    BUN 25 (*)    Creatinine, Ser 1.46 (*)    Calcium 8.0 (*)    Albumin 3.0 (*)    Alkaline Phosphatase 153 (*)    GFR calc non Af Amer 32 (*)    GFR calc Af Amer 37 (*)    All other components within normal limits  PROTIME-INR - Abnormal; Notable for the following:    Prothrombin Time 24.4 (*)    INR 2.28 (*)    All other components within normal limits  URINALYSIS, ROUTINE W REFLEX MICROSCOPIC - Abnormal; Notable for the following:    APPearance CLOUDY (*)    Hgb urine dipstick SMALL (*)    Leukocytes, UA LARGE (*)    All other components within normal limits  URINE MICROSCOPIC-ADD ON - Abnormal; Notable for the following:    Squamous Epithelial / LPF FEW (*)    Bacteria, UA MANY (*)    All other components within normal limits  GLUCOSE, CAPILLARY - Abnormal; Notable for the following:    Glucose-Capillary 101 (*)    All other components within normal limits  URINE CULTURE  TROPONIN I  POCT CBG (FASTING - GLUCOSE)-MANUAL ENTRY   Imaging Review Ct Head Wo Contrast  11/27/2013   CLINICAL DATA:  Syncopal episode  EXAM: CT HEAD WITHOUT CONTRAST  TECHNIQUE: Contiguous axial images were obtained from the base of the skull through the vertex without intravenous contrast.  COMPARISON:  None.  FINDINGS: The bony calvarium is intact. No gross soft tissue abnormality is noted. Mild atrophic changes and  chronic white matter ischemic change is seen. There are no findings to suggest acute hemorrhage, acute infarction or space-occupying mass lesion.  IMPRESSION: Chronic changes without acute abnormality.   Electronically Signed   By: Inez Catalina M.D.   On: 11/27/2013 14:43    EKG Interpretation   None       MDM   1. Syncope     Patient presenting with syncopal episode.  Afebrile, NAD, non-toxic appearing, AAOx4. No neurofocal deficits. EKG reviewed. CT head reviewed. Labs  pending at time of shift change. Will sign out to [employer], PA-C. Patient will need to be admitted for syncopal work up. Patient d/w with Dr. Aline Brochure, agrees with plan.       Harlow Mares, PA-C 11/27/13 1617

## 2013-11-27 NOTE — ED Provider Notes (Signed)
4:53 PM Patient signed out to me by [place], PA-C.  Patient with syncopal episode.  Concern for cardiac syncope.  Multiple delays in getting troponin.  Patient seen by and discussed with Dr. Aline Brochure.  Patient admitted by [facility].  Called medtronic to interrogate the pacemaker.  Montine Circle, PA-C 11/27/13 2358

## 2013-11-27 NOTE — ED Notes (Signed)
CBG was 101 

## 2013-11-27 NOTE — Progress Notes (Signed)
Pt had 9 beats of Vtach while sleeping. Pt asymptomatic. Fredirick Maudlin, NP made aware. Orders placed. Strip in chart. Cont to monitor closely.

## 2013-11-27 NOTE — ED Notes (Signed)
Pt arrived via EMS: From home, was walking into living room, woke up on the floor.  No c/o of pain, weakness, n/v, dizziness afterwards.  LOC, landed on tile in kitchen. Alert and oriented, denies head and neck tenderness. Patient has a pacemaker that is continually paced at 70 bpm.

## 2013-11-27 NOTE — H&P (Signed)
[facility] History and Physical  [name] [id number] DOB: 05/14/1926 DOA: 11/27/2013  Referring physician: er PCP: Kandice Hams, MD   Chief Complaint: passed out  HPI: [name] is a 80 y.o. female  Who comes in after passing out.  She was home.  Walked into her kitchen, and without any symptoms, was awoken by her daughter on the floor.  Patient was mildly confused and told her daughter that she "layed down.  She was not dizzy before and had no other symptoms  +bladder fullness +cough but not coughing before she had the event Blood sugar was 101 upon arrival  In er, work up was virtually unremarkable, head CT, EKG, CE and labs similar to previous Pacemaker was not interrogated but I ordered and interrogation   Review of Systems:  All systems reviewed, negative unless stated above   Past Medical History  Diagnosis Date  . Mitral valve disorder     a. Severe MR s/p repair 2003 (28 mm annuloplasty ring and and oversew of LAA). No CAD by cath at that time.  . Pacemaker     a. 2003: post-op afib after MR repair then symptomatic bradycardia requiring pacemaker. b. Upgrade to Medtronic Bi-V Pacemaker 2007.  [name] Cirrhosis     a. Newly recognized 09/2013 - by CT.  . CKD (chronic kidney disease)   . Pulmonary hypertension, mild     secondary to mild/mod LV dysfunction(EF 40%)  . Diastolic dysfunction   . Spinal stenosis     shes recieved epidural steroid injections in the past  . Chronic combined systolic and diastolic CHF (congestive heart failure)     EF 35-40% on echo 09/2013  . Chronic atrial fibrillation     a. Previously failed DCCV/amio/tikosyn  . NSVT (nonsustained ventricular tachycardia)     a. Isolated event during 09/2007 adm.  . Brady-tachy syndrome     s/p permanent pacemaker placement  . Hypertension    Past Surgical History  Procedure Laterality Date  . Valve replacement      sever mitral regurgitation s/p mitral valve annuloplasty ring   . Cholecystectomy    . Appendectomy    . Irrigation and debridement abscess N/A 10/14/2013    Procedure: IRRIGATION AND DEBRIDEMENT PERINEAL ABSCESS;  Surgeon: Rolm Bookbinder, MD;  Location: [facility];  Service: General;  Laterality: N/A;   Social History:  reports that she quit smoking about 42 years ago. She does not have any smokeless tobacco history on file. She reports that she drinks alcohol. She reports that she does not use illicit drugs.  No Known Allergies  Family History  Problem Relation Age of Onset  . Pancreatic cancer Father   . Other Mother     Mother died at 72 with no real medical problems     Prior to Admission medications   Medication Sig Start Date End Date Taking? Authorizing Provider  acetaminophen (TYLENOL) 500 MG tablet Take 500 mg by mouth every 6 (six) hours as needed for mild pain.   Yes Historical Provider, MD  albuterol (PROVENTIL) (5 MG/ML) 0.5% nebulizer solution Take 0.5 mLs (2.5 mg total) by nebulization every 6 (six) hours as needed for wheezing or shortness of breath. 10/20/13  Yes Kinnie Feil, MD  beta carotene w/minerals (OCUVITE) tablet Take 1 tablet by mouth every evening.    Yes Historical Provider, MD  carvedilol (COREG) 6.25 MG tablet Take 1 tablet (6.25 mg total) by mouth 2 (two) times daily with a meal. 10/31/13  Yes Sueanne Margarita, MD  cetirizine (ZYRTEC) 10 MG tablet Take 10 mg by mouth daily.   Yes Historical Provider, MD  digoxin (LANOXIN) 0.125 MG tablet Take 0.0625 mg by mouth daily.   Yes Historical Provider, MD  ferrous sulfate 325 (65 FE) MG tablet Take 325 mg by mouth daily with supper.    Yes Historical Provider, MD  Multiple Vitamin (MULTIVITAMIN WITH MINERALS) TABS tablet Take 1 tablet by mouth daily.   Yes Historical Provider, MD  Oxymetazoline HCl (NOSTRILLA CONGESTION RELIEF NA) Place 1 spray into the nose daily as needed (for congestion).   Yes Historical Provider, MD  potassium chloride SA (K-DUR,KLOR-CON) 20 MEQ tablet  Take 20 mEq by mouth every morning.    Yes Historical Provider, MD  Pseudoephedrine-APAP-DM (DAYQUIL PO) Take 2 capsules by mouth once.   Yes Historical Provider, MD  torsemide (DEMADEX) 20 MG tablet 2 tablets po daily. (= 40mg  qd) 11/01/13  Yes Casandra Doffing, MD  traMADol (ULTRAM) 50 MG tablet Take 0.5 tablets (25 mg total) by mouth every 6 (six) hours as needed (headache). 10/20/13  Yes Kinnie Feil, MD  warfarin (COUMADIN) 5 MG tablet Take 2.5-5 mg by mouth daily. Take 2.5mg  daily, except take 5mg  on Mondays.   Yes Historical Provider, MD  zolpidem (AMBIEN) 5 MG tablet Take 5 mg by mouth at bedtime as needed for sleep.   Yes Historical Provider, MD  colchicine 0.6 MG tablet Take 0.6 mg by mouth daily as needed (Gout flareups).     Historical Provider, MD   Physical Exam: Filed Vitals:   11/27/13 1649  BP: 101/51  Pulse:   Resp: 17    BP 101/51  Pulse 70  Resp 17  Ht 5\' 3"  (1.6 m)  Wt 89.359 kg (197 lb)  BMI 34.91 kg/m2  SpO2 99%  LMP 10/10/2013  General:  Appears calm and comfortable Eyes: PERRL, normal lids, irises & conjunctiva ENT: grossly normal hearing, lips & tongue Neck: no LAD, masses or thyromegaly Cardiovascular: RRR, no m/r/g. No LE edema. Respiratory: CTA bilaterally, no w/r/r. Normal respiratory effort. Abdomen: soft, ntnd Skin: no rash or induration seen on limited exam Musculoskeletal: grossly normal tone BUE/BLE Psychiatric: grossly normal mood and affect, speech fluent and appropriate Neurologic: grossly non-focal.          Labs on Admission:  Basic Metabolic Panel:  Recent Labs Lab 11/27/13 1450  NA 143  K 3.9  CL 102  CO2 25  GLUCOSE 98  BUN 25*  CREATININE 1.46*  CALCIUM 8.0*   Liver Function Tests:  Recent Labs Lab 11/27/13 1450  AST 24  ALT 17  ALKPHOS 153*  BILITOT 1.0  PROT 7.2  ALBUMIN 3.0*   No results found for this basename: LIPASE, AMYLASE,  in the last 168 hours No results found for this basename: AMMONIA,  in the  last 168 hours CBC:  Recent Labs Lab 11/27/13 1450  WBC 8.5  NEUTROABS 5.1  HGB 10.0*  HCT 32.0*  MCV 86.0  PLT 134*   Cardiac Enzymes:  Recent Labs Lab 11/27/13 1450  TROPONINI <0.30    BNP (last 3 results)  Recent Labs  10/10/13 1634 10/13/13 1113  PROBNP 6217.0* 8553.0*   CBG:  Recent Labs Lab 11/27/13 1600  GLUCAP 101*    Radiological Exams on Admission: Dg Chest 2 View  11/27/2013   CLINICAL DATA:  Recent loss of consciousness  EXAM: CHEST  2 VIEW  COMPARISON:  10/15/2013  FINDINGS: Cardiac shadow is  enlarged. Postsurgical changes are seen. A pacing device is noted. The lungs are clear bilaterally. No acute bony abnormality is seen.  IMPRESSION: No active cardiopulmonary disease.   Electronically Signed   By: Inez Catalina M.D.   On: 11/27/2013 16:23   Ct Head Wo Contrast  11/27/2013   CLINICAL DATA:  Syncopal episode  EXAM: CT HEAD WITHOUT CONTRAST  TECHNIQUE: Contiguous axial images were obtained from the base of the skull through the vertex without intravenous contrast.  COMPARISON:  None.  FINDINGS: The bony calvarium is intact. No gross soft tissue abnormality is noted. Mild atrophic changes and chronic white matter ischemic change is seen. There are no findings to suggest acute hemorrhage, acute infarction or space-occupying mass lesion.  IMPRESSION: Chronic changes without acute abnormality.   Electronically Signed   By: Inez Catalina M.D.   On: 11/27/2013 14:43      Assessment/Plan Active Problems:   CKD (chronic kidney disease) stage 3, GFR 30-59 ml/min   Biventricular cardiac pacemaker in situ   Syncope   UTI (lower urinary tract infection)   1. UTI- rocephin, culture 2. Syncope- await pacemaker interrogation- if abnormal, will need to call cardiology, cycle CE, check TSH, gentle IVF for 12 hours 3. CKD- gently hydrate, resume diuretics in AM 4. A fib- coumadin- pacemaker   Code Status: full Family Communication: daughter Disposition Plan:  obs  Time spent: 26 min  Eulogio Bear [facility] Pager [phone]

## 2013-11-27 NOTE — Progress Notes (Signed)
ANTICOAGULATION CONSULT NOTE - Initial Consult  Pharmacy Consult for coumadin Indication: atrial fibrillation  No Known Allergies  Patient Measurements: Height: 5\' 3"  (160 cm) Weight: 197 lb (89.359 kg) IBW/kg (Calculated) : 52.4  Vital Signs: BP: 124/61 mmHg (01/11 1830) Pulse Rate: 71 (01/11 1830)  Labs:  Recent Labs  11/27/13 1450  HGB 10.0*  HCT 32.0*  PLT 134*  LABPROT 24.4*  INR 2.28*  CREATININE 1.46*  TROPONINI <0.30    Estimated Creatinine Clearance: 29.9 ml/min (by C-G formula based on Cr of 1.46).   Medical History: Past Medical History  Diagnosis Date  . Mitral valve disorder     a. Severe MR s/p repair 2003 (28 mm annuloplasty ring and and oversew of LAA). No CAD by cath at that time.  . Pacemaker     a. 2003: post-op afib after MR repair then symptomatic bradycardia requiring pacemaker. b. Upgrade to Medtronic Bi-V Pacemaker 2007.  [name] Cirrhosis     a. Newly recognized 09/2013 - by CT.  . CKD (chronic kidney disease)   . Pulmonary hypertension, mild     secondary to mild/mod LV dysfunction(EF 40%)  . Diastolic dysfunction   . Spinal stenosis     shes recieved epidural steroid injections in the past  . Chronic combined systolic and diastolic CHF (congestive heart failure)     EF 35-40% on echo 09/2013  . Chronic atrial fibrillation     a. Previously failed DCCV/amio/tikosyn  . NSVT (nonsustained ventricular tachycardia)     a. Isolated event during 09/2007 adm.  . Brady-tachy syndrome     s/p permanent pacemaker placement  . Hypertension     Medications:  Prescriptions prior to admission  Medication Sig Dispense Refill  . acetaminophen (TYLENOL) 500 MG tablet Take 500 mg by mouth every 6 (six) hours as needed for mild pain.      [name] albuterol (PROVENTIL) (5 MG/ML) 0.5% nebulizer solution Take 0.5 mLs (2.5 mg total) by nebulization every 6 (six) hours as needed for wheezing or shortness of breath.  20 mL  12  . beta carotene w/minerals (OCUVITE)  tablet Take 1 tablet by mouth every evening.       . carvedilol (COREG) 6.25 MG tablet Take 1 tablet (6.25 mg total) by mouth 2 (two) times daily with a meal.  60 tablet  11  . cetirizine (ZYRTEC) 10 MG tablet Take 10 mg by mouth daily.      . digoxin (LANOXIN) 0.125 MG tablet Take 0.0625 mg by mouth daily.      . ferrous sulfate 325 (65 FE) MG tablet Take 325 mg by mouth daily with supper.       . Multiple Vitamin (MULTIVITAMIN WITH MINERALS) TABS tablet Take 1 tablet by mouth daily.      . Oxymetazoline HCl (NOSTRILLA CONGESTION RELIEF NA) Place 1 spray into the nose daily as needed (for congestion).      . potassium chloride SA (K-DUR,KLOR-CON) 20 MEQ tablet Take 20 mEq by mouth every morning.       . Pseudoephedrine-APAP-DM (DAYQUIL PO) Take 2 capsules by mouth once.      . torsemide (DEMADEX) 20 MG tablet 2 tablets po daily. (= 40mg  qd)      . traMADol (ULTRAM) 50 MG tablet Take 0.5 tablets (25 mg total) by mouth every 6 (six) hours as needed (headache).  30 tablet  0  . warfarin (COUMADIN) 5 MG tablet Take 2.5-5 mg by mouth daily. Take 2.5mg  daily, except take 5mg   on Mondays.      [name] zolpidem (AMBIEN) 5 MG tablet Take 5 mg by mouth at bedtime as needed for sleep.      . colchicine 0.6 MG tablet Take 0.6 mg by mouth daily as needed (Gout flareups).        Scheduled:  . beta carotene w/minerals  1 tablet Oral QPM  . [START ON 11/28/2013] carvedilol  6.25 mg Oral BID WC  . cefTRIAXone (ROCEPHIN)  IV  1 g Intravenous Q24H  . [START ON 11/28/2013] digoxin  0.0625 mg Oral Daily  . [START ON 11/28/2013] ferrous sulfate  325 mg Oral Q supper  . [START ON 11/28/2013] multivitamin with minerals  1 tablet Oral Daily  . [START ON 11/28/2013] potassium chloride SA  20 mEq Oral q morning - 10a  . sodium chloride  3 mL Intravenous Q12H    Assessment: 80 yo female here with syncope on coumadin for afib PTA and to continue while admitted. INR today is 2.28 and at goal.  Home coumadin dose: 2.5mg  daily  except take 5mg  on Mon (last dose taken 11/27/13)  Goal of Therapy:  INR 2-3 Monitor platelets by anticoagulation protocol: Yes   Plan:   -Continue coumadin as taken at home -Daily PT/INR for now  Hildred Laser, Pharm D 11/27/2013 8:23 PM

## 2013-11-28 ENCOUNTER — Observation Stay (HOSPITAL_COMMUNITY): Payer: Medicare Other

## 2013-11-28 DIAGNOSIS — I509 Heart failure, unspecified: Secondary | ICD-10-CM

## 2013-11-28 DIAGNOSIS — Z7901 Long term (current) use of anticoagulants: Secondary | ICD-10-CM

## 2013-11-28 DIAGNOSIS — I5042 Chronic combined systolic (congestive) and diastolic (congestive) heart failure: Secondary | ICD-10-CM

## 2013-11-28 DIAGNOSIS — I4891 Unspecified atrial fibrillation: Secondary | ICD-10-CM

## 2013-11-28 DIAGNOSIS — R42 Dizziness and giddiness: Secondary | ICD-10-CM

## 2013-11-28 LAB — TROPONIN I: Troponin I: <0.3 ng/mL (ref <0.30)

## 2013-11-28 LAB — PROTIME-INR
INR: 2.47 — ABNORMAL HIGH (ref 0.00–1.49)
Prothrombin Time: 25.9 seconds — ABNORMAL HIGH (ref 11.6–15.2)

## 2013-11-28 LAB — MAGNESIUM: MAGNESIUM: 2.1 mg/dL (ref 1.5–2.5)

## 2013-11-28 LAB — TSH: TSH: 1.964 u[IU]/mL (ref 0.350–4.500)

## 2013-11-28 LAB — DIGOXIN LEVEL: DIGOXIN LVL: 0.6 ng/mL — AB (ref 0.8–2.0)

## 2013-11-28 MED ORDER — PREDNISONE 20 MG PO TABS
20.0000 mg | ORAL_TABLET | Freq: Every day | ORAL | Status: AC
  Administered 2013-11-28 – 2013-11-30 (×3): 20 mg via ORAL
  Filled 2013-11-28 (×3): qty 1

## 2013-11-28 MED ORDER — COLCHICINE 0.6 MG PO TABS
0.6000 mg | ORAL_TABLET | Freq: Every day | ORAL | Status: DC
  Administered 2013-11-28 – 2013-11-30 (×3): 0.6 mg via ORAL
  Filled 2013-11-28 (×3): qty 1

## 2013-11-28 NOTE — Consult Note (Signed)
WOC asked to evaluate perirectal wound, after review of chart noted that patient has had extensive surgery per Dr. Donne Hazel for perirectal abscess that had fistualized into the vagina.  [facility] has been managing her wound care and follow up in their office last 11/07/13.  Would suggest consultation with Dr. Donne Hazel while inpatient so that he can evaluate the status of this wound and make further recommendations for her care at this time.  WOC will contact Dr. Cristal Generous office to notify of her admission.  Teesha Ohm Whiting RN,CWOCN [id number]

## 2013-11-28 NOTE — Progress Notes (Signed)
UR completed. Patient changed to inpatient r/t syncope with known card hx.

## 2013-11-28 NOTE — Consult Note (Signed)
CARDIOLOGY CONSULT NOTE   Patient ID: PHALON KATSAROS MRN: [id number] DOB/AGE: August 18, 1926 80 y.o.  Admit date: 11/27/2013  Primary Physician   Kandice Hams, MD Primary Cardiologist   TT Reason for Consultation   Syncope  [id number] MILA BAHLMANN is a 80 y.o. female with a history of chronic combined systolic/diastolic CHF, severe MR s/p MV repair, chronic atrial fibrillation on coumadin(tried on Tikosyn/amiodarone), BiV PPM upgrade in 2007 and CKD, newly recognized cirrhosis, perirectal abcess with I&D on 10/14/13.  2D echo from 10/11/13, with challenging windows,  demonstrated EF 35-40% with hypokinesis of the anteroseptal myocardium, mild MR, mod dil LA, PA pressure 40mmHg.  The patient presented on 11/27/13 after having a syncopal episode.    She had been at [facility] place for rehab after d/c from the hospital 12/04. She had been home, living with her daughter since 01/02. Yesterday, she was feeling as usual and walked into the kitchen. She was a little hungry, no urge to move bowels or bladder, no acute discomfort and her daughter saw her fall. She "crumpled" to the floor, no injuries, no seizure activity, no incontinence. She woke up in a few seconds, no confusion or post-ictal state. She had no pain.    She has had 2 short runs of NSVT on telemetry, but there was no significant arrhythmia at the time of the event.   Past Medical History  Diagnosis Date  . Mitral valve disorder     a. Severe MR s/p repair 2003 (28 mm annuloplasty ring and and oversew of LAA). No CAD by cath at that time.  . Pacemaker     a. 2003: post-op afib after MR repair then symptomatic bradycardia requiring pacemaker. b. Upgrade to Medtronic Bi-V Pacemaker 2007.  [name] Cirrhosis     a. Newly recognized 09/2013 - by CT.  . CKD (chronic kidney disease)   . Pulmonary hypertension, mild     secondary to mild/mod LV dysfunction(EF 40%)  . Diastolic dysfunction   . Spinal stenosis     shes recieved epidural steroid  injections in the past  . Chronic combined systolic and diastolic CHF (congestive heart failure)     EF 35-40% on echo 09/2013  . Chronic atrial fibrillation     a. Previously failed DCCV/amio/tikosyn  . NSVT (nonsustained ventricular tachycardia)     a. Isolated event during 09/2007 adm.  . Brady-tachy syndrome     s/p permanent pacemaker placement  . Hypertension      Past Surgical History  Procedure Laterality Date  . Valve replacement      sever mitral regurgitation s/p mitral valve annuloplasty ring  . Cholecystectomy    . Appendectomy    . Irrigation and debridement abscess N/A 10/14/2013    Procedure: IRRIGATION AND DEBRIDEMENT PERINEAL ABSCESS;  Surgeon: Rolm Bookbinder, MD;  Location: [place];  Service: General;  Laterality: N/A;    No Known Allergies  I have reviewed the patient's current medications . beta carotene w/minerals  1 tablet Oral QPM  . carvedilol  6.25 mg Oral BID WC  . cefTRIAXone (ROCEPHIN)  IV  1 g Intravenous Q24H  . colchicine  0.6 mg Oral Daily  . digoxin  0.0625 mg Oral Daily  . ferrous sulfate  325 mg Oral Q supper  . multivitamin with minerals  1 tablet Oral Daily  . potassium chloride SA  20 mEq Oral q morning - 10a  . predniSONE  20 mg Oral Q breakfast  . sodium chloride  3 mL Intravenous Q12H  . [START ON 11/29/2013] warfarin  2.5 mg Oral Custom  . warfarin  5 mg Oral Q Mon-1800  . Warfarin - Pharmacist Dosing Inpatient   Does not apply q1800     albuterol, colchicine, guaiFENesin-dextromethorphan, ondansetron (ZOFRAN) IV, ondansetron, traMADol, zolpidem  Prior to Admission medications   Medication Sig Start Date End Date Taking? Authorizing Provider  acetaminophen (TYLENOL) 500 MG tablet Take 500 mg by mouth every 6 (six) hours as needed for mild pain.   Yes Historical Provider, MD  albuterol (PROVENTIL) (5 MG/ML) 0.5% nebulizer solution Take 0.5 mLs (2.5 mg total) by nebulization every 6 (six) hours as needed for wheezing or shortness  of breath. 10/20/13  Yes Kinnie Feil, MD  beta carotene w/minerals (OCUVITE) tablet Take 1 tablet by mouth every evening.    Yes Historical Provider, MD  carvedilol (COREG) 6.25 MG tablet Take 1 tablet (6.25 mg total) by mouth 2 (two) times daily with a meal. 10/31/13  Yes Sueanne Margarita, MD  cetirizine (ZYRTEC) 10 MG tablet Take 10 mg by mouth daily.   Yes Historical Provider, MD  digoxin (LANOXIN) 0.125 MG tablet Take 0.0625 mg by mouth daily.   Yes Historical Provider, MD  ferrous sulfate 325 (65 FE) MG tablet Take 325 mg by mouth daily with supper.    Yes Historical Provider, MD  Multiple Vitamin (MULTIVITAMIN WITH MINERALS) TABS tablet Take 1 tablet by mouth daily.   Yes Historical Provider, MD  Oxymetazoline HCl (NOSTRILLA CONGESTION RELIEF NA) Place 1 spray into the nose daily as needed (for congestion).   Yes Historical Provider, MD  potassium chloride SA (K-DUR,KLOR-CON) 20 MEQ tablet Take 20 mEq by mouth every morning.    Yes Historical Provider, MD  Pseudoephedrine-APAP-DM (DAYQUIL PO) Take 2 capsules by mouth once.   Yes Historical Provider, MD  torsemide (DEMADEX) 20 MG tablet 2 tablets po daily. (= 40mg  qd) 11/01/13  Yes Casandra Doffing, MD  traMADol (ULTRAM) 50 MG tablet Take 0.5 tablets (25 mg total) by mouth every 6 (six) hours as needed (headache). 10/20/13  Yes Kinnie Feil, MD  warfarin (COUMADIN) 5 MG tablet Take 2.5-5 mg by mouth daily. Take 2.5mg  daily, except take 5mg  on Mondays.   Yes Historical Provider, MD  zolpidem (AMBIEN) 5 MG tablet Take 5 mg by mouth at bedtime as needed for sleep.   Yes Historical Provider, MD  colchicine 0.6 MG tablet Take 0.6 mg by mouth daily as needed (Gout flareups).     Historical Provider, MD     History   Social History  . Marital Status: Single    Spouse Name: N/A    Number of Children: N/A  . Years of Education: N/A   Occupational History  . Not on file.   Social History Main Topics  . Smoking status: Former Smoker    Quit  date: 10/11/1971  . Smokeless tobacco: Not on file  . Alcohol Use: 0.0 oz/week     Comment: rare  . Drug Use: No  . Sexual Activity: Not on file   Other Topics Concern  . Not on file   Social History Narrative  . No narrative on file    Family Status  Relation Status Death Age  . Father Deceased   . Mother Deceased   . Sister Alive   . Sister Alive   . Brother Alive    Family History  Problem Relation Age of Onset  . Pancreatic cancer Father   .  Other Mother     Mother died at 32 with no real medical problems     ROS:  Full 14 point review of systems complete and found to be negative unless listed above.  Physical Exam: Blood pressure 103/61, pulse 69, temperature 98.7 F (37.1 C), temperature source Oral, resp. rate 18, height 5\' 3"  (1.6 m), weight 207 lb 1.6 oz (93.94 kg), last menstrual period 10/10/2013, SpO2 96.00%.  General: Well developed, well nourished, female in no acute distress Head: Eyes PERRLA, No xanthomas.   Normocephalic and atraumatic, oropharynx without edema or exudate. Dentition: poor Lungs: CTA bilaterally Heart: HRRR S1 S2, no rub/gallop, no significant murmur. pulses are 2+ extrem.   Neck: No carotid bruits. No lymphadenopathy.  JVD not elevated. Abdomen: Bowel sounds present, abdomen soft and non-tender without masses or hernias noted. Msk:  No spine or cva tenderness. No weakness, no joint deformities or effusions. Extremities: No clubbing or cyanosis. no edema.  Neuro: Alert and oriented X 3. No focal deficits noted. Psych:  Good affect, responds appropriately Skin: No rashes or lesions noted.  Labs:   Lab Results  Component Value Date   WBC 8.5 11/27/2013   HGB 10.0* 11/27/2013   HCT 32.0* 11/27/2013   MCV 86.0 11/27/2013   PLT 134* 11/27/2013    Recent Labs  11/28/13 0620  INR 2.47*    Recent Labs Lab 11/27/13 1450  NA 143  K 3.9  CL 102  CO2 25  BUN 25*  CREATININE 1.46*  CALCIUM 8.0*  PROT 7.2  BILITOT 1.0  ALKPHOS  153*  ALT 17  AST 24  GLUCOSE 98  ALBUMIN 3.0*   Magnesium  Date Value Range Status  11/28/2013 2.1  1.5 - 2.5 mg/dL Final    Recent Labs  11/27/13 1450 11/27/13 2055 11/28/13 0043 11/28/13 0755  TROPONINI <0.30 <0.30 <0.30 <0.30    Pro B Natriuretic peptide (BNP)  Date/Time Value Range Status  10/13/2013 11:13 AM 8553.0* 0 - 450 pg/mL Final  10/10/2013  4:34 PM 6217.0* 0 - 450 pg/mL Final    TSH  Date/Time Value Range Status  11/27/2013  8:55 PM 1.964  0.350 - 4.500 uIU/mL Final     Performed at [facility]    Echo: 10/11/13: Study Conclusions - Left ventricle: The cavity size was mildly dilated. Systolic function was moderately reduced. The estimated ejection fraction was in the range of 35% to 40%. Challenging to estimate given poor windows. There is hypokinesis of the anteroseptal myocardium. - Mitral valve: Moderately calcified annulus. Mildly thickened leaflets . Mild regurgitation. - Left atrium: The atrium was moderately dilated. - Right ventricle: The cavity size was mildly dilated. Wall thickness was normal. - Right atrium: The atrium was mildly dilated. - Pulmonary arteries: PA peak pressure: 45mm Hg (S).    ECG: V-pacing  Radiology:  Dg Chest 2 View  11/27/2013   CLINICAL DATA:  Recent loss of consciousness  EXAM: CHEST  2 VIEW  COMPARISON:  10/15/2013  FINDINGS: Cardiac shadow is enlarged. Postsurgical changes are seen. A pacing device is noted. The lungs are clear bilaterally. No acute bony abnormality is seen.  IMPRESSION: No active cardiopulmonary disease.   Electronically Signed   By: Inez Catalina M.D.   On: 11/27/2013 16:23   Ct Head Wo Contrast 11/27/2013   CLINICAL DATA:  Syncopal episode  EXAM: CT HEAD WITHOUT CONTRAST  TECHNIQUE: Contiguous axial images were obtained from the base of the skull through the vertex without intravenous contrast.  COMPARISON:  None.  FINDINGS: The bony calvarium is intact. No gross soft tissue abnormality  is noted. Mild atrophic changes and chronic white matter ischemic change is seen. There are no findings to suggest acute hemorrhage, acute infarction or space-occupying mass lesion.  IMPRESSION: Chronic changes without acute abnormality.   Electronically Signed   By: Inez Catalina M.D.   On: 11/27/2013 14:43   Dg Ankle Right Port 11/28/2013   CLINICAL DATA:  Ankle pain  EXAM: PORTABLE RIGHT ANKLE - 2 VIEW  COMPARISON:  None.  FINDINGS: There is no evidence of fracture, dislocation, or joint effusion. There is no evidence of arthropathy or other focal bone abnormality. There is generalized soft tissue swelling.  IMPRESSION: No acute osseous injury of the right ankle.   Electronically Signed   By: Kathreen Devoid   On: 11/28/2013 11:01    ASSESSMENT AND PLAN:   The patient was seen today by Dr. Meda Coffee, the patient evaluated and the data reviewed.     Syncope - No prodrome, no arrhythmia noted at the time of the event. PPM functioning appropriately. MD advise on further evaluation. SBP has been > 100. Clinically significant d-dimer unlikely with no SOB and normal oxygen saturation but could check LE dopplers. MD review data and advise further eval.    NSVT - self-limiting, on his home dose of Coreg, did not occur at the time of her syncope, further eval per MD.  Otherwise, per primary MD. Active Problems:   CKD (chronic kidney disease) stage 3, GFR 30-59 ml/min   Biventricular cardiac pacemaker in situ   UTI (lower urinary tract infection)   Signed: Rosaria Ferries, PA-C 11/28/2013 4:15 PM [place] [phone]  The patient was seen, examined and discussed with Rosaria Ferries, PA-C and I agree with the above.   In summary, Mrs Goulet is a 80 y.o. female with a history of chronic combined systolic/diastolic CHF, severe MR s/p MV repair, chronic atrial fibrillation on coumadin, BiV PPM upgrade in 2007 and CKD, newly recognized cirrhosis, perirectal abcess with I&D on 10/14/13.  2D echo from 10/11/13  showedvEF 35-40% with hypokinesis of the anteroseptal myocardium, mild MR, mod dil LA, PA pressure 46mmHg.  The patient presented on 11/27/13 after having a witnessed syncopal episode.  She was sitting for a prolonged time after which she stood up and walked to the kitchen and passed out at the kitchen counter and woke up few seconds later. She has no recollection of the event. Telemetry in the hospital shows two short runs of nsVT, however PM interrogation shows no arrhythmias at the time of the event. This is most probably an episode of orthostatic hypotension in the settings of an acute infection/hypotension (UTI), she is on iv antibiotics, We will follow until tomorrow, if no recurrent symptoms or significant arrhythmias on telemetry, it is ok to discharge and follow up as outpatient.      [name], [name] 11/28/2013

## 2013-11-28 NOTE — ED Provider Notes (Signed)
Medical screening examination/treatment/procedure(s) were conducted as a shared visit with non-physician practitioner(s) and myself.  I personally evaluated the patient during the encounter.  EKG Interpretation    Date/Time:  Sunday November 27 2013 13:22:39 EST Ventricular Rate:  70 PR Interval:    QRS Duration: 164 QT Interval:  458 QTC Calculation: 494 R Axis:   -75 Text Interpretation:  Electronic ventricular pacemaker Confirmed by Shenequa Howse  MD, [place] ([id number]) on 11/28/2013 8:57:46 AM            I interviewed and examined the patient. Lungs are CTAB. Cardiac exam wnl. Abdomen soft.  Pt continues to appear well on exam. Plan on admission.   Blanchard Kelch, MD 11/28/13 [phone]

## 2013-11-28 NOTE — Consult Note (Signed)
WOC wound consult note Reason for Consult: dressing change orders for perirectal abcess site. Spoke directly with Dr. Donne Hazel. He reports no special dressing needed, she has been using normal saline dressing and at the last visit with her this area was healing well.  Pt in agreement that daily moist gauze dressing/packing is the wound care.  She has follow up appt with Dr. Donne Hazel Jan. 20th, I have instructed her to keep that appointment as scheduled.   Wound type: surgical wound Dressing procedure/placement/frequency: moist gauze to be fluffed and packed into open perirectal wound, change daily, top with dry dressing, secure with tape or mesh panties whichever patient prefers.   New orders written for bedside nursing staff to change dressings daily.   Discussed POC with patient and bedside nurse.  Re consult if needed, will not follow at this time. Thanks  [name], [place] [phone])

## 2013-11-28 NOTE — Progress Notes (Signed)
ANTICOAGULATION CONSULT NOTE   Pharmacy Consult for Coumadin Indication: atrial fibrillation  No Known Allergies  Patient Measurements: Height: 5\' 3"  (160 cm) Weight: 207 lb 1.6 oz (93.94 kg) IBW/kg (Calculated) : 52.4  Vital Signs: Temp: 99.5 F (37.5 C) (01/12 0500) BP: 110/41 mmHg (01/12 0500) Pulse Rate: 70 (01/12 0500)  Labs:  Recent Labs  11/27/13 1450 11/27/13 2055 11/28/13 0043 11/28/13 0620 11/28/13 0755  HGB 10.0*  --   --   --   --   HCT 32.0*  --   --   --   --   PLT 134*  --   --   --   --   LABPROT 24.4*  --   --  25.9*  --   INR 2.28*  --   --  2.47*  --   CREATININE 1.46*  --   --   --   --   TROPONINI <0.30 <0.30 <0.30  --  <0.30    Estimated Creatinine Clearance: 30.7 ml/min (by C-G formula based on Cr of 1.46).  Assessment: 80 yo female here with syncope on coumadin for afib PTA and to continue while admitted. INR on admission was 2.28 and is at goal today  Home coumadin dose: 2.5mg  daily except take 5mg  on Mon (last dose taken 11/27/13)  Goal of Therapy:  INR 2-3 Monitor platelets by anticoagulation protocol: Yes   Plan:   -Continue coumadin as taken at home -Daily PT/INR for now  Thank you. Anette Guarneri, PharmD [phone]   11/28/2013 10:20 AM

## 2013-11-28 NOTE — Progress Notes (Addendum)
Subjective: Admission H&P reviewed, patient admitted with syncopal spell. Urinary tract infection. Patient today complains of cough, nonproductive, she's had a four-week period she does not believe she had a coughing spell before passing out. She also complains of right ankle pain consistent with her known gout. Patient's INR is therapeutic. CT of the head without acute abnormality. There is no bruising etc. Which is surprising considering the patient is on therapeutic dose of Coumadin and supposedly had a syncopal spell. She does appear little confused Objective: Vital signs in last 24 hours: Temp:  [98.5 F (36.9 C)-99.5 F (37.5 C)] 99.5 F (37.5 C) (01/12 0500) Pulse Rate:  [70-71] 70 (01/12 0500) Resp:  [17-26] 18 (01/12 0500) BP: (101-136)/(41-73) 110/41 mmHg (01/12 0500) SpO2:  [96 %-99 %] 97 % (01/12 0500) Weight:  [89.359 kg (197 lb)-93.94 kg (207 lb 1.6 oz)] 93.94 kg (207 lb 1.6 oz) (01/11 1945) Weight change:  Last BM Date: 11/27/13  Intake/Output from previous day: 01/11 0701 - 01/12 0700 In: 958.8 [P.O.:240; I.V.:668.8; IV Piggyback:50] Out: -  Intake/Output this shift:    General appearance: pleasant, slightly confused Resp: clear to auscultation bilaterally Cardio: regular rate and rhythm Extremities: no edema, pain right ankle with movement and some warmth  Lab Results:  Results for orders placed during the hospital encounter of 11/27/13 (from the past 24 hour(s))  URINALYSIS, ROUTINE W REFLEX MICROSCOPIC     Status: Abnormal   Collection Time    11/27/13  2:23 PM      Result Value Range   Color, Urine YELLOW  YELLOW   APPearance CLOUDY (*) CLEAR   Specific Gravity, Urine 1.008  1.005 - 1.030   pH 5.0  5.0 - 8.0   Glucose, UA NEGATIVE  NEGATIVE mg/dL   Hgb urine dipstick SMALL (*) NEGATIVE   Bilirubin Urine NEGATIVE  NEGATIVE   Ketones, ur NEGATIVE  NEGATIVE mg/dL   Protein, ur NEGATIVE  NEGATIVE mg/dL   Urobilinogen, UA 0.2  0.0 - 1.0 mg/dL   Nitrite  NEGATIVE  NEGATIVE   Leukocytes, UA LARGE (*) NEGATIVE  URINE MICROSCOPIC-ADD ON     Status: Abnormal   Collection Time    11/27/13  2:23 PM      Result Value Range   Squamous Epithelial / LPF FEW (*) RARE   WBC, UA TOO NUMEROUS TO COUNT  <3 WBC/hpf   Bacteria, UA MANY (*) RARE  CBC WITH DIFFERENTIAL     Status: Abnormal   Collection Time    11/27/13  2:50 PM      Result Value Range   WBC 8.5  4.0 - 10.5 K/uL   RBC 3.72 (*) 3.87 - 5.11 MIL/uL   Hemoglobin 10.0 (*) 12.0 - 15.0 g/dL   HCT 32.0 (*) 36.0 - 46.0 %   MCV 86.0  78.0 - 100.0 fL   MCH 26.9  26.0 - 34.0 pg   MCHC 31.3  30.0 - 36.0 g/dL   RDW 17.0 (*) 11.5 - 15.5 %   Platelets 134 (*) 150 - 400 K/uL   Neutrophils Relative % 60  43 - 77 %   Neutro Abs 5.1  1.7 - 7.7 K/uL   Lymphocytes Relative 29  12 - 46 %   Lymphs Abs 2.4  0.7 - 4.0 K/uL   Monocytes Relative 10  3 - 12 %   Monocytes Absolute 0.9  0.1 - 1.0 K/uL   Eosinophils Relative 1  0 - 5 %   Eosinophils Absolute 0.0  0.0 - 0.7 K/uL   Basophils Relative 0  0 - 1 %   Basophils Absolute 0.0  0.0 - 0.1 K/uL  COMPREHENSIVE METABOLIC PANEL     Status: Abnormal   Collection Time    11/27/13  2:50 PM      Result Value Range   Sodium 143  137 - 147 mEq/L   Potassium 3.9  3.7 - 5.3 mEq/L   Chloride 102  96 - 112 mEq/L   CO2 25  19 - 32 mEq/L   Glucose, Bld 98  70 - 99 mg/dL   BUN 25 (*) 6 - 23 mg/dL   Creatinine, Ser 1.46 (*) 0.50 - 1.10 mg/dL   Calcium 8.0 (*) 8.4 - 10.5 mg/dL   Total Protein 7.2  6.0 - 8.3 g/dL   Albumin 3.0 (*) 3.5 - 5.2 g/dL   AST 24  0 - 37 U/L   ALT 17  0 - 35 U/L   Alkaline Phosphatase 153 (*) 39 - 117 U/L   Total Bilirubin 1.0  0.3 - 1.2 mg/dL   GFR calc non Af Amer 32 (*) >90 mL/min   GFR calc Af Amer 37 (*) >90 mL/min  PROTIME-INR     Status: Abnormal   Collection Time    11/27/13  2:50 PM      Result Value Range   Prothrombin Time 24.4 (*) 11.6 - 15.2 seconds   INR 2.28 (*) 0.00 - 1.49  TROPONIN I     Status: None   Collection  Time    11/27/13  2:50 PM      Result Value Range   Troponin I <0.30  <0.30 ng/mL  GLUCOSE, CAPILLARY     Status: Abnormal   Collection Time    11/27/13  4:00 PM      Result Value Range   Glucose-Capillary 101 (*) 70 - 99 mg/dL  TSH     Status: None   Collection Time    11/27/13  8:55 PM      Result Value Range   TSH 1.964  0.350 - 4.500 uIU/mL  TROPONIN I     Status: None   Collection Time    11/27/13  8:55 PM      Result Value Range   Troponin I <0.30  <0.30 ng/mL  TROPONIN I     Status: None   Collection Time    11/28/13 12:43 AM      Result Value Range   Troponin I <0.30  <0.30 ng/mL  DIGOXIN LEVEL     Status: Abnormal   Collection Time    11/28/13 12:43 AM      Result Value Range   Digoxin Level 0.6 (*) 0.8 - 2.0 ng/mL  MAGNESIUM     Status: None   Collection Time    11/28/13 12:43 AM      Result Value Range   Magnesium 2.1  1.5 - 2.5 mg/dL  PROTIME-INR     Status: Abnormal   Collection Time    11/28/13  6:20 AM      Result Value Range   Prothrombin Time 25.9 (*) 11.6 - 15.2 seconds   INR 2.47 (*) 0.00 - 1.49  TROPONIN I     Status: None   Collection Time    11/28/13  7:55 AM      Result Value Range   Troponin I <0.30  <0.30 ng/mL      Studies/Results: Dg Chest 2 View  11/27/2013   CLINICAL DATA:  Recent loss of consciousness  EXAM: CHEST  2 VIEW  COMPARISON:  10/15/2013  FINDINGS: Cardiac shadow is enlarged. Postsurgical changes are seen. A pacing device is noted. The lungs are clear bilaterally. No acute bony abnormality is seen.  IMPRESSION: No active cardiopulmonary disease.   Electronically Signed   By: Inez Catalina M.D.   On: 11/27/2013 16:23   Ct Head Wo Contrast  11/27/2013   CLINICAL DATA:  Syncopal episode  EXAM: CT HEAD WITHOUT CONTRAST  TECHNIQUE: Contiguous axial images were obtained from the base of the skull through the vertex without intravenous contrast.  COMPARISON:  None.  FINDINGS: The bony calvarium is intact. No gross soft tissue  abnormality is noted. Mild atrophic changes and chronic white matter ischemic change is seen. There are no findings to suggest acute hemorrhage, acute infarction or space-occupying mass lesion.  IMPRESSION: Chronic changes without acute abnormality.   Electronically Signed   By: Inez Catalina M.D.   On: 11/27/2013 14:43    Medications:  Prior to Admission:  Prescriptions prior to admission  Medication Sig Dispense Refill  . acetaminophen (TYLENOL) 500 MG tablet Take 500 mg by mouth every 6 (six) hours as needed for mild pain.      [name] albuterol (PROVENTIL) (5 MG/ML) 0.5% nebulizer solution Take 0.5 mLs (2.5 mg total) by nebulization every 6 (six) hours as needed for wheezing or shortness of breath.  20 mL  12  . beta carotene w/minerals (OCUVITE) tablet Take 1 tablet by mouth every evening.       . carvedilol (COREG) 6.25 MG tablet Take 1 tablet (6.25 mg total) by mouth 2 (two) times daily with a meal.  60 tablet  11  . cetirizine (ZYRTEC) 10 MG tablet Take 10 mg by mouth daily.      . digoxin (LANOXIN) 0.125 MG tablet Take 0.0625 mg by mouth daily.      . ferrous sulfate 325 (65 FE) MG tablet Take 325 mg by mouth daily with supper.       . Multiple Vitamin (MULTIVITAMIN WITH MINERALS) TABS tablet Take 1 tablet by mouth daily.      . Oxymetazoline HCl (NOSTRILLA CONGESTION RELIEF NA) Place 1 spray into the nose daily as needed (for congestion).      . potassium chloride SA (K-DUR,KLOR-CON) 20 MEQ tablet Take 20 mEq by mouth every morning.       . Pseudoephedrine-APAP-DM (DAYQUIL PO) Take 2 capsules by mouth once.      . torsemide (DEMADEX) 20 MG tablet 2 tablets po daily. (= 40mg  qd)      . traMADol (ULTRAM) 50 MG tablet Take 0.5 tablets (25 mg total) by mouth every 6 (six) hours as needed (headache).  30 tablet  0  . warfarin (COUMADIN) 5 MG tablet Take 2.5-5 mg by mouth daily. Take 2.5mg  daily, except take 5mg  on Mondays.      [name] zolpidem (AMBIEN) 5 MG tablet Take 5 mg by mouth at bedtime as  needed for sleep.      . colchicine 0.6 MG tablet Take 0.6 mg by mouth daily as needed (Gout flareups).        Scheduled: . beta carotene w/minerals  1 tablet Oral QPM  . carvedilol  6.25 mg Oral BID WC  . cefTRIAXone (ROCEPHIN)  IV  1 g Intravenous Q24H  . digoxin  0.0625 mg Oral Daily  . ferrous sulfate  325 mg Oral Q supper  . multivitamin with minerals  1 tablet Oral Daily  .  potassium chloride SA  20 mEq Oral q morning - 10a  . sodium chloride  3 mL Intravenous Q12H  . [START ON 11/29/2013] warfarin  2.5 mg Oral Custom  . warfarin  5 mg Oral Q Mon-1800  . Warfarin - Pharmacist Dosing Inpatient   Does not apply q1800   Continuous:   Assessment/Plan: Syncope an elderly patient with pacer. Await pacer interrogation. Check orthostatics, no report of seizure activity. The patient has been having a cough. It is possible her syncope is related to this.I will try to discuss with family to get additional history  UTI, continue antibiotics,   Ankle pain, patient has a known history of gout. With history of syncope her slightly confused state we will obtain x-ray  Cough, lung exam fairly unremarkable consider check x-ray after IV fluids  CKD stage III  Anemia probably related to chronic kidney disease, baseline hemoglobin was 10 at last check in June,  LOS: 1 day   Goran Olden D 11/28/2013, 10:16 AM   Case discussed with patient's daughter. Patient recently was discharged from a nursing home after surgery for perirectal abscess. Patient did have some reduction in her BP medicines because of dizziness. Patient was in her usual state of health prior to admission, upon getting off the couch walking to the kitchen she had a syncopal spell. There was no seizure activity. She states her BP has been stable at home or to with physical therapy.

## 2013-11-29 LAB — URINE CULTURE: Colony Count: >=100000

## 2013-11-29 LAB — PROTIME-INR
INR: 2.61 — ABNORMAL HIGH (ref 0.00–1.49)
PROTHROMBIN TIME: 27 s — AB (ref 11.6–15.2)

## 2013-11-29 MED ORDER — CIPROFLOXACIN HCL 250 MG PO TABS
250.0000 mg | ORAL_TABLET | Freq: Two times a day (BID) | ORAL | Status: DC
  Administered 2013-11-29 – 2013-11-30 (×3): 250 mg via ORAL
  Filled 2013-11-29 (×6): qty 1

## 2013-11-29 NOTE — Progress Notes (Signed)
ANTICOAGULATION CONSULT NOTE   Pharmacy Consult for Coumadin Indication: atrial fibrillation  No Known Allergies  Patient Measurements: Height: 5\' 3"  (160 cm) Weight: 207 lb 1.6 oz (93.94 kg) IBW/kg (Calculated) : 52.4  Vital Signs: Temp: 98.4 F (36.9 C) (01/13 0840) Temp src: Oral (01/13 0840) BP: 103/46 mmHg (01/13 0847) Pulse Rate: 73 (01/13 0847)  Labs:  Recent Labs  11/27/13 1450 11/27/13 2055 11/28/13 0043 11/28/13 0620 11/28/13 0755 11/29/13 0328  HGB 10.0*  --   --   --   --   --   HCT 32.0*  --   --   --   --   --   PLT 134*  --   --   --   --   --   LABPROT 24.4*  --   --  25.9*  --  27.0*  INR 2.28*  --   --  2.47*  --  2.61*  CREATININE 1.46*  --   --   --   --   --   TROPONINI <0.30 <0.30 <0.30  --  <0.30  --     Estimated Creatinine Clearance: 30.7 ml/min (by C-G formula based on Cr of 1.46).  Assessment: 80 yo female here with syncope on coumadin for afib PTA and to continue while admitted. INR on admission was 2.28 and is at goal today  Home coumadin dose: 2.5mg  daily except take 5mg  on Mon (last dose taken 11/27/13)  Goal of Therapy:  INR 2-3 Monitor platelets by anticoagulation protocol: Yes   Plan:   -Continue coumadin as taken at home -Daily PT/INR for now  Thank you. Anette Guarneri, PharmD [phone]   11/29/2013 9:37 AM

## 2013-11-29 NOTE — Evaluation (Signed)
Physical Therapy Evaluation Patient Details Name: [name] MRN: [id number] DOB: Sep 29, 1926 Today's Date: 11/29/2013 Time: [id number] PT Time Calculation (min): 44 min  PT Assessment / Plan / Recommendation History of Present Illness  80 y.o. female admitted to [facility] on 11/27/13 with syncopal episode at home.    Clinical Impression  Pt is progressing well with PT.  She was active with [facility] PTA and was limited in her gait distance PTA due to chronic back pain.  She would be much safer if she had a rollator for home use as it is very sudden and without much warning that her back hurts and she has to sit down.   PT to follow acutely for deficits listed below.       PT Assessment  Patient needs continued PT services    Follow Up Recommendations  Home health PT;Supervision for mobility/OOB    Does the patient have the potential to tolerate intense rehabilitation     NA  Barriers to Discharge   None      Equipment Recommendations  Other (comment) (4-wheeled RW-rollator)    Recommendations for Other Services   None  Frequency Min 3X/week    Precautions / Restrictions Precautions Precautions: Fall Precaution Comments: pt with h/o back pain limiting her ambulation distance at baseline   Pertinent Vitals/Pain BP in sitting immediately after walking: 103/49 MAP 63 97% O2 on RA, HR 73 bpm, BP in standing after walking again 105/44 MAP (57), 94% O2 on RA, HR 75 bpm.       Mobility  Bed Mobility Overal bed mobility: Modified Independent General bed mobility comments: used railing, but no external assist.  Transfers Overall transfer level: Needs assistance Equipment used: Rolling walker (2 wheeled) Transfers: Sit to/from Stand Sit to Stand: Min guard;From elevated surface General transfer comment: min guard for safety from elevated bed with RW.  Verbal cues for safe RW use/hand placement.  Ambulation/Gait Ambulation/Gait assistance: Min assist Ambulation Distance (Feet):  80 Feet Assistive device: Rolling walker (2 wheeled) Gait Pattern/deviations: Step-through pattern;Shuffle;Antalgic;Trunk flexed Gait velocity: decreased General Gait Details: Pt needs min assist to support trunk, especially as she continues further into gait.  We got almost back to her room and her back pain would not permit her to go any further.  We had to pull up a chair for her to sit.  She reports this happens at home and in the community as well.  It is for this reason that I think she would be safer with a rollator as there is not much warning when she is unable to continue to go further.  BPs taken after gait in both sitting and standing.  Althoug BP was lower, it did not drop significantly in standing vs. sitting.          PT Diagnosis: Difficulty walking;Generalized weakness;Abnormality of gait;Acute pain  PT Problem List: Decreased strength;Decreased activity tolerance;Decreased balance;Decreased mobility;Pain;Decreased knowledge of use of DME PT Treatment Interventions: DME instruction;Gait training;Stair training;Functional mobility training;Therapeutic activities;Therapeutic exercise;Balance training;Patient/family education;Neuromuscular re-education;Modalities     PT Goals(Current goals can be found in the care plan section) Acute Rehab PT Goals Patient Stated Goal: to get better endurance PT Goal Formulation: With patient/family Time For Goal Achievement: 12/13/13 Potential to Achieve Goals: Good  Visit Information  Last PT Received On: 11/29/13 Assistance Needed: +1 History of Present Illness: 80 y.o. female admitted to [facility] on 11/27/13 with syncopal episode at home.         Prior [place]  Living Family/patient expects to be discharged to:: Private residence Living Arrangements: Children (daughter and her family) Available Help at Discharge: Family;Available 24 hours/day (daughter 24/7- she is currently unemployeed) Type of Home: House Home Access: Stairs to  enter [employer] of Steps: 1 Entrance Stairs-Rails: None Home Layout: Two level;Able to live on main level with bedroom/bathroom;Full bath on main level Home Equipment: Walker - 2 wheels;Shower seat;Hand held shower head Additional Comments: pt's daughter wonders if she would benefit from walker with seat Prior Function Level of Independence: Independent with assistive device(s) Comments: uses RW full time inside the home right now.  She just recently (beginning of Jan) finished rehab at [facility] and is active with [facility].   Communication Communication: No difficulties    Cognition  Cognition Arousal/Alertness: Awake/alert Behavior During Therapy: WFL for tasks assessed/performed Overall Cognitive Status: Within Functional Limits for tasks assessed    Extremity/Trunk Assessment Upper Extremity Assessment Upper Extremity Assessment: Generalized weakness Lower Extremity Assessment Lower Extremity Assessment: Generalized weakness (left ankle gout flare up currently limiting WB and gait) Cervical / Trunk Assessment Cervical / Trunk Assessment: Normal      End of Session PT - End of Session Equipment Utilized During Treatment: Gait belt Activity Tolerance: Patient limited by pain Patient left: in bed;with call bell/phone within reach;with family/visitor present    Wells Guiles B. Gunhild Bautch, [place], DPT [phone]   11/29/2013, 4:53 PM

## 2013-11-29 NOTE — Progress Notes (Signed)
AT 1937 pt had 18 beats non-sustained Vtach.  Pt reports feeling palpitations.  At 2008, pt had 5 more beats non-sustained Vtach.  MD on call notified.  No further orders at this time.  Will continue to monitor.

## 2013-11-29 NOTE — Progress Notes (Signed)
Subjective: Patient without new complaints, no fever chills chest pain shortness of breath. Cardiology input appreciated, yesterday orthostatic vitals were order it, I do not see them.  Objective: Vital signs in last 24 hours: Temp:  [98.4 F (36.9 C)-98.8 F (37.1 C)] 98.4 F (36.9 C) (01/13 0840) Pulse Rate:  [69-73] 73 (01/13 0847) Resp:  [16-18] 18 (01/13 0847) BP: (103-127)/(46-61) 103/46 mmHg (01/13 0847) SpO2:  [95 %-97 %] 95 % (01/13 0847) Weight change:  Last BM Date: 11/27/13  Intake/Output from previous day: 01/12 0701 - 01/13 0700 In: 170 [P.O.:120; IV Piggyback:50] Out: -  Intake/Output this shift: Total I/O In: 480 [P.O.:480] Out: -   General appearance: alert and cooperative Resp: clear to auscultation bilaterally Cardio: regular rate and rhythm Extremities: ankle pain improved  Lab Results:  Results for orders placed during the hospital encounter of 11/27/13 (from the past 24 hour(s))  PROTIME-INR     Status: Abnormal   Collection Time    11/29/13  3:28 AM      Result Value Range   Prothrombin Time 27.0 (*) 11.6 - 15.2 seconds   INR 2.61 (*) 0.00 - 1.49      Studies/Results: Dg Chest 2 View  11/27/2013   CLINICAL DATA:  Recent loss of consciousness  EXAM: CHEST  2 VIEW  COMPARISON:  10/15/2013  FINDINGS: Cardiac shadow is enlarged. Postsurgical changes are seen. A pacing device is noted. The lungs are clear bilaterally. No acute bony abnormality is seen.  IMPRESSION: No active cardiopulmonary disease.   Electronically Signed   By: Inez Catalina M.D.   On: 11/27/2013 16:23   Ct Head Wo Contrast  11/27/2013   CLINICAL DATA:  Syncopal episode  EXAM: CT HEAD WITHOUT CONTRAST  TECHNIQUE: Contiguous axial images were obtained from the base of the skull through the vertex without intravenous contrast.  COMPARISON:  None.  FINDINGS: The bony calvarium is intact. No gross soft tissue abnormality is noted. Mild atrophic changes and chronic white matter ischemic  change is seen. There are no findings to suggest acute hemorrhage, acute infarction or space-occupying mass lesion.  IMPRESSION: Chronic changes without acute abnormality.   Electronically Signed   By: Inez Catalina M.D.   On: 11/27/2013 14:43   Dg Ankle Right Port  11/28/2013   CLINICAL DATA:  Ankle pain  EXAM: PORTABLE RIGHT ANKLE - 2 VIEW  COMPARISON:  None.  FINDINGS: There is no evidence of fracture, dislocation, or joint effusion. There is no evidence of arthropathy or other focal bone abnormality. There is generalized soft tissue swelling.  IMPRESSION: No acute osseous injury of the right ankle.   Electronically Signed   By: Kathreen Devoid   On: 11/28/2013 11:01    Medications:  Prior to Admission:  Prescriptions prior to admission  Medication Sig Dispense Refill  . acetaminophen (TYLENOL) 500 MG tablet Take 500 mg by mouth every 6 (six) hours as needed for mild pain.      [name] albuterol (PROVENTIL) (5 MG/ML) 0.5% nebulizer solution Take 0.5 mLs (2.5 mg total) by nebulization every 6 (six) hours as needed for wheezing or shortness of breath.  20 mL  12  . beta carotene w/minerals (OCUVITE) tablet Take 1 tablet by mouth every evening.       . carvedilol (COREG) 6.25 MG tablet Take 1 tablet (6.25 mg total) by mouth 2 (two) times daily with a meal.  60 tablet  11  . cetirizine (ZYRTEC) 10 MG tablet Take 10 mg by mouth  daily.      . digoxin (LANOXIN) 0.125 MG tablet Take 0.0625 mg by mouth daily.      . ferrous sulfate 325 (65 FE) MG tablet Take 325 mg by mouth daily with supper.       . Multiple Vitamin (MULTIVITAMIN WITH MINERALS) TABS tablet Take 1 tablet by mouth daily.      . Oxymetazoline HCl (NOSTRILLA CONGESTION RELIEF NA) Place 1 spray into the nose daily as needed (for congestion).      . potassium chloride SA (K-DUR,KLOR-CON) 20 MEQ tablet Take 20 mEq by mouth every morning.       . Pseudoephedrine-APAP-DM (DAYQUIL PO) Take 2 capsules by mouth once.      . torsemide (DEMADEX) 20 MG  tablet 2 tablets po daily. (= 40mg  qd)      . traMADol (ULTRAM) 50 MG tablet Take 0.5 tablets (25 mg total) by mouth every 6 (six) hours as needed (headache).  30 tablet  0  . warfarin (COUMADIN) 5 MG tablet Take 2.5-5 mg by mouth daily. Take 2.5mg  daily, except take 5mg  on Mondays.      [name] zolpidem (AMBIEN) 5 MG tablet Take 5 mg by mouth at bedtime as needed for sleep.      . colchicine 0.6 MG tablet Take 0.6 mg by mouth daily as needed (Gout flareups).        Scheduled: . beta carotene w/minerals  1 tablet Oral QPM  . carvedilol  6.25 mg Oral BID WC  . cefTRIAXone (ROCEPHIN)  IV  1 g Intravenous Q24H  . colchicine  0.6 mg Oral Daily  . digoxin  0.0625 mg Oral Daily  . ferrous sulfate  325 mg Oral Q supper  . multivitamin with minerals  1 tablet Oral Daily  . potassium chloride SA  20 mEq Oral q morning - 10a  . predniSONE  20 mg Oral Q breakfast  . sodium chloride  3 mL Intravenous Q12H  . warfarin  2.5 mg Oral Custom  . Warfarin - Pharmacist Dosing Inpatient   Does not apply q1800   Continuous:  [id number], colchicine, guaiFENesin-dextromethorphan, ondansetron (ZOFRAN) IV, ondansetron, traMADol, zolpidem  Assessment/Plan: Syncope, pacer interrogated no abnormality cardiology input appreciated. Still awaiting orthostatics. Patient feels much better. Case discussed with daughter in detail yesterday. UTI, apparently responding to antibiotics, will be changed to oral antibiotics Ankle pain consistent with gout, x-ray without abnormalities Recent surgery for perirectal abscess,  Anemia Chronic kidney disease stage III  LOS: 2 days   [name] D 11/29/2013, 10:07 AM

## 2013-11-29 NOTE — Progress Notes (Signed)
Subjective: No CP, SOB, dizziness  Objective: Vital signs in last 24 hours: Temp:  [98.4 F (36.9 C)-98.8 F (37.1 C)] 98.4 F (36.9 C) (01/13 0840) Pulse Rate:  [69-75] 75 (01/13 1128) Resp:  [16-18] 18 (01/13 0847) BP: (103-127)/(44-61) 105/44 mmHg (01/13 1128) SpO2:  [94 %-97 %] 94 % (01/13 1128) Last BM Date: 11/27/13  Intake/Output from previous day: 01/12 0701 - 01/13 0700 In: 170 [P.O.:120; IV Piggyback:50] Out: -  Intake/Output this shift: Total I/O In: 483 [P.O.:480; I.V.:3] Out: -   Medications Current Facility-Administered Medications  Medication Dose Route Frequency Provider Last Rate Last Dose  . albuterol (PROVENTIL) (5 MG/ML) 0.5% nebulizer solution 2.5 mg  2.5 mg Nebulization Q6H PRN Geradine Girt, DO      . beta carotene w/minerals (OCUVITE) tablet 1 tablet  1 tablet Oral QPM Geradine Girt, DO   1 tablet at 11/28/13 1730  . carvedilol (COREG) tablet 6.25 mg  6.25 mg Oral BID WC Jessica U Vann, DO   6.25 mg at 11/29/13 1020  . ciprofloxacin (CIPRO) tablet 250 mg  250 mg Oral BID Kandice Hams, MD      . colchicine tablet 0.6 mg  0.6 mg Oral Daily PRN Geradine Girt, DO   0.6 mg at 11/28/13 1013  . colchicine tablet 0.6 mg  0.6 mg Oral Daily Kandice Hams, MD   0.6 mg at 11/29/13 1021  . digoxin (LANOXIN) tablet 0.0625 mg  0.0625 mg Oral Daily Jessica U Vann, DO   0.0625 mg at 11/29/13 1021  . ferrous sulfate tablet 325 mg  325 mg Oral Q supper Geradine Girt, DO   325 mg at 11/28/13 1730  . guaiFENesin-dextromethorphan (ROBITUSSIN DM) 100-10 MG/5ML syrup 5 mL  5 mL Oral Q4H PRN Dianne Dun, NP   5 mL at 11/28/13 1538  . multivitamin with minerals tablet 1 tablet  1 tablet Oral Daily Geradine Girt, DO   1 tablet at 11/29/13 1021  . ondansetron (ZOFRAN) tablet 4 mg  4 mg Oral Q6H PRN Geradine Girt, DO       Or  . ondansetron (ZOFRAN) injection 4 mg  4 mg Intravenous Q6H PRN Geradine Girt, DO   4 mg at 11/28/13 0935  . potassium chloride  SA (K-DUR,KLOR-CON) CR tablet 20 mEq  20 mEq Oral q morning - 10a Jessica U Vann, DO   20 mEq at 11/29/13 1021  . predniSONE (DELTASONE) tablet 20 mg  20 mg Oral Q breakfast Kandice Hams, MD   20 mg at 11/29/13 0816  . sodium chloride 0.9 % injection 3 mL  3 mL Intravenous Q12H Jessica U Vann, DO   3 mL at 11/29/13 1021  . traMADol (ULTRAM) tablet 25 mg  25 mg Oral Q6H PRN Geradine Girt, DO   25 mg at 11/28/13 1539  . warfarin (COUMADIN) tablet 2.5 mg  2.5 mg Oral Custom Dareen Piano, [facility]      . Warfarin - Pharmacist Dosing Inpatient   Does not apply [place], [facility]      . zolpidem [facility]) tablet 5 mg  5 mg Oral QHS PRN Dianne Dun, NP   5 mg at 11/28/13 2115    PE: General appearance: alert, cooperative and no distress Lungs: clear to auscultation bilaterally Heart: regular rate and rhythm, S1, S2 normal, no murmur, click, rub or gallop, regular rate and rhythm and 1/6 sys MM Extremities: No  LEE Pulses: 2+ and symmetric Skin: Warm and dry Neurologic: Grossly normal  Lab Results:   Recent Labs  11/27/13 1450  WBC 8.5  HGB 10.0*  HCT 32.0*  PLT 134*   BMET  Recent Labs  11/27/13 1450  NA 143  K 3.9  CL 102  CO2 25  GLUCOSE 98  BUN 25*  CREATININE 1.46*  CALCIUM 8.0*   PT/INR  Recent Labs  11/27/13 1450 11/28/13 0620 11/29/13 0328  LABPROT 24.4* 25.9* 27.0*  INR 2.28* 2.47* 2.61*    Assessment/Plan    Active Problems:   CKD (chronic kidney disease) stage 3, GFR 30-59 ml/min   Biventricular cardiac pacemaker in situ   Syncope   UTI (lower urinary tract infection)  Plan:  V-pacing on tele.  Occasional PVCs.  Pause on tele was during PPM interrogation.  BP stable.  Not orthostatic.   LOS: 2 days    [name] 11/29/2013 11:39 AM  [name] is a 80 y.o. female with a history of chronic combined systolic/diastolic CHF, severe MR s/p MV repair, chronic atrial fibrillation on coumadin, BiV PPM upgrade in 2007 and CKD,  newly recognized cirrhosis, perirectal abcess with I&D on 10/14/13. 2D echo from 10/11/13 showed EF 35-40% with hypokinesis of the anteroseptal myocardium, mild MR, mod dil LA, PA pressure 9mmHg. The patient presented on 11/27/13 after having a witnessed syncopal episode. She was sitting for a prolonged time after which she stood up and walked to the kitchen and passed out at the kitchen counter and woke up few seconds later. She has no recollection of the event. Telemetry in the hospital shows two short runs of nsVT yesterday, however PM interrogation shows no arrhythmias at the time of the event.  The patient had another tun of nsVT just now. This was asymptomatic. Troponin was negative. There is no need for further work up of nsVT as they are rare and asymptomatic. She doesn't qualify for an ICD as her EF > 35%. Ideally we would like to increase her carvedilol, but there is not much room with her BP.  She can be discharged today and followed in our clinic as an outpatient. Syncope was most probably an episode of orthostatic hypotension in the settings of an acute infection/hypotension (UTI), she was switched to PO ATB today. It is ok to discharge and follow up as outpatient.   [name], [name] 11/29/2013

## 2013-11-29 NOTE — Progress Notes (Signed)
Patient had 9 beat run of Vtach.  Lying in bed patient stated she could feel the beats.  Tarri Fuller PA paged and notified.  No new orders recieved.   Will continue to monitor.  Sanda Linger

## 2013-11-30 LAB — PROTIME-INR
INR: 2.11 — AB (ref 0.00–1.49)
Prothrombin Time: 23 seconds — ABNORMAL HIGH (ref 11.6–15.2)

## 2013-11-30 MED ORDER — CIPROFLOXACIN HCL 250 MG PO TABS: 250.0000 mg | ORAL_TABLET | Freq: Two times a day (BID) | ORAL | Status: DC

## 2013-11-30 NOTE — Progress Notes (Signed)
ANTICOAGULATION CONSULT NOTE   Pharmacy Consult for Coumadin Indication: atrial fibrillation  No Known Allergies  Patient Measurements: Height: 5\' 3"  (160 cm) Weight: 207 lb 1.6 oz (93.94 kg) IBW/kg (Calculated) : 52.4  Vital Signs: Temp: 97.1 F (36.2 C) (01/14 0509) Temp src: Oral (01/14 0509) BP: 125/54 mmHg (01/14 0813) Pulse Rate: 75 (01/14 0813)  Labs:  Recent Labs  11/27/13 1450 11/27/13 2055 11/28/13 0043 11/28/13 0620 11/28/13 0755 11/29/13 0328 11/30/13 0258  HGB 10.0*  --   --   --   --   --   --   HCT 32.0*  --   --   --   --   --   --   PLT 134*  --   --   --   --   --   --   LABPROT 24.4*  --   --  25.9*  --  27.0* 23.0*  INR 2.28*  --   --  2.47*  --  2.61* 2.11*  CREATININE 1.46*  --   --   --   --   --   --   TROPONINI <0.30 <0.30 <0.30  --  <0.30  --   --     Estimated Creatinine Clearance: 30.7 ml/min (by C-G formula based on Cr of 1.46).  Assessment: 80 yo female here with syncope on coumadin for afib PTA and to continue while admitted. INR on admission was 2.28 and is at goal today  Home coumadin dose: 2.5mg  daily except take 5mg  on Mon (last dose taken 11/27/13)  Goal of Therapy:  INR 2-3 Monitor platelets by anticoagulation protocol: Yes   Plan:   -Continue coumadin as taken at home -Now on Cipro - may cause an increase in INR -Daily PT/INR for now  Thank you. Anette Guarneri, PharmD [phone]   11/30/2013 10:16 AM

## 2013-11-30 NOTE — Discharge Instructions (Signed)
HH- PT resumed with [facility]- [phone]

## 2013-11-30 NOTE — Progress Notes (Signed)
DC orders received.  Patient stable with no S/S of distress.  Medication and discharge information reviewed with patient.  Patient DC home. [place], Ardeth Sportsman

## 2013-11-30 NOTE — ED Provider Notes (Signed)
Medical screening examination/treatment/procedure(s) were performed by non-physician practitioner and as supervising physician I was immediately available for consultation/collaboration.  Leota Jacobsen, MD 11/30/13 [phone]

## 2013-11-30 NOTE — Discharge Summary (Signed)
Physician Discharge Summary  Patient ID: [name] MRN: [id number] DOB/AGE: Jan 17, 1926 80 y.o.  Admit date: 11/27/2013 Discharge date: 11/30/2013  Admission Diagnoses:  Discharge Diagnoses:  Active Problems:   CKD (chronic kidney disease) stage 3, GFR 30-59 ml/min   Biventricular cardiac pacemaker in situ   Syncope   UTI (lower urinary tract infection)   Discharged Condition: stable  Hospital Course:  Patient presented to the hospital after having syncope at home. Because of her multiple comorbidities admission was deemed necessary. Patient was seen in consultation by cardiology, her pacer was interrogated, there was no arrhythmia that caused syncope. She was found to have a urinary tract infection. IV antibiotics were started, they were converted to oral. She felt much better the second day of hospitalization after IV fluids and antibiotics. Orthostatics were ordered the following day, orthostatics were negative. She was seen by physical therapy she ambulated well. Patient does require some assist of devices which will be provided. [name] cause for syncope was felt to be multifactorial, UTI, probably related to orthostasis that was not captured and also probably related to a cough that she was having. Her chest x-ray was negative for any pneumonia. At this time patient is back to her baseline level of function and is felt to be stable for discharge to home  Consults: Treatment Team:  Rounding Lbcardiology, MD  Significant Diagnostic Studies:Dg Chest 2 View  11/27/2013   CLINICAL DATA:  Recent loss of consciousness  EXAM: CHEST  2 VIEW  COMPARISON:  10/15/2013  FINDINGS: Cardiac shadow is enlarged. Postsurgical changes are seen. A pacing device is noted. The lungs are clear bilaterally. No acute bony abnormality is seen.  IMPRESSION: No active cardiopulmonary disease.   Electronically Signed   By: Inez Catalina M.D.   On: 11/27/2013 16:23   Ct Head Wo Contrast  11/27/2013    CLINICAL DATA:  Syncopal episode  EXAM: CT HEAD WITHOUT CONTRAST  TECHNIQUE: Contiguous axial images were obtained from the base of the skull through the vertex without intravenous contrast.  COMPARISON:  None.  FINDINGS: The bony calvarium is intact. No gross soft tissue abnormality is noted. Mild atrophic changes and chronic white matter ischemic change is seen. There are no findings to suggest acute hemorrhage, acute infarction or space-occupying mass lesion.  IMPRESSION: Chronic changes without acute abnormality.   Electronically Signed   By: Inez Catalina M.D.   On: 11/27/2013 14:43   Dg Ankle Right Port  11/28/2013   CLINICAL DATA:  Ankle pain  EXAM: PORTABLE RIGHT ANKLE - 2 VIEW  COMPARISON:  None.  FINDINGS: There is no evidence of fracture, dislocation, or joint effusion. There is no evidence of arthropathy or other focal bone abnormality. There is generalized soft tissue swelling.  IMPRESSION: No acute osseous injury of the right ankle.   Electronically Signed   By: Kathreen Devoid   On: 11/28/2013 11:01      Discharge Exam: Blood pressure 125/54, pulse 73, temperature 97.1 F (36.2 C), temperature source Oral, resp. rate 18, height 5\' 3"  (1.6 m), weight 93.94 kg (207 lb 1.6 oz), last menstrual period 10/10/2013, SpO2 97.00%. General appearance: alert and cooperative Resp: clear to auscultation bilaterally Cardio: regular rate and rhythm GI: soft, non-tender; bowel sounds normal; no masses,  no organomegaly Extremities: extremities normal, atraumatic, no cyanosis or edema  Disposition: 01-Home or Self Care   Future Appointments Provider Department Dept Phone   12/06/2013 10:20 AM Rolm Bookbinder, MD [facility] [place]  12/07/2013 9:30 AM Evans Lance, MD [place] [phone]   12/12/2013 1:00 PM [facility] [place] [facility] [phone]   12/12/2013 1:15 PM Sueanne Margarita, MD [place]  [phone]   01/31/2014 9:15 AM Sueanne Margarita, MD [place] [phone]       Medication List         acetaminophen 500 MG tablet  Commonly known as:  TYLENOL  Take 500 mg by mouth every 6 (six) hours as needed for mild pain.     albuterol (5 MG/ML) 0.5% nebulizer solution  Commonly known as:  PROVENTIL  Take 0.5 mLs (2.5 mg total) by nebulization every 6 (six) hours as needed for wheezing or shortness of breath.     beta carotene w/minerals tablet  Take 1 tablet by mouth every evening.     carvedilol 6.25 MG tablet  Commonly known as:  COREG  Take 1 tablet (6.25 mg total) by mouth 2 (two) times daily with a meal.     cetirizine 10 MG tablet  Commonly known as:  ZYRTEC  Take 10 mg by mouth daily.     ciprofloxacin 250 MG tablet  Commonly known as:  CIPRO  Take 1 tablet (250 mg total) by mouth 2 (two) times daily.     colchicine 0.6 MG tablet  Take 0.6 mg by mouth daily as needed (Gout flareups).     DAYQUIL PO  Take 2 capsules by mouth once.     digoxin 0.125 MG tablet  Commonly known as:  LANOXIN  Take 0.0625 mg by mouth daily.     ferrous sulfate 325 (65 FE) MG tablet  Take 325 mg by mouth daily with supper.     multivitamin with minerals Tabs tablet  Take 1 tablet by mouth daily.     NOSTRILLA CONGESTION RELIEF NA  Place 1 spray into the nose daily as needed (for congestion).     potassium chloride SA 20 MEQ tablet  Commonly known as:  K-DUR,KLOR-CON  Take 20 mEq by mouth every morning.     torsemide 20 MG tablet  Commonly known as:  DEMADEX  2 tablets po daily. (= 40mg  qd)     traMADol 50 MG tablet  Commonly known as:  ULTRAM  Take 0.5 tablets (25 mg total) by mouth every 6 (six) hours as needed (headache).     warfarin 5 MG tablet  Commonly known as:  COUMADIN  Take 2.5-5 mg by mouth daily. Take 2.5mg  daily, except take 5mg  on Mondays.     zolpidem 5 MG tablet  Commonly known as:  AMBIEN  Take 5 mg by mouth at bedtime as  needed for sleep.           Follow-up Information   Follow up with Sueanne Margarita, MD On 12/12/2013. (1:15PM)    Specialty:  Cardiology   Contact information:   [id number] [place] [address] [phone]       Follow up with [facility] appt On 12/12/2013. (1:00 PM)       Follow up with Kandice Hams, MD In 1 week.   Specialty:  Internal Medicine   Contact information:   [address] [place] [address] [phone]       Signed: Kandice Hams 11/30/2013, 1:20 PM

## 2013-11-30 NOTE — Progress Notes (Addendum)
Subjective: No complains, denies CP, SOB, dizziness.  Objective: Vital signs in last 24 hours: Temp:  [97.1 F (36.2 C)-98.2 F (36.8 C)] 97.1 F (36.2 C) (01/14 0509) Pulse Rate:  [71-75] 75 (01/14 0813) Resp:  [18] 18 (01/14 0509) BP: (103-140)/(44-60) 125/54 mmHg (01/14 0813) SpO2:  [92 %-97 %] 96 % (01/14 0509) Last BM Date: 11/29/13  Intake/Output from previous day: 01/13 0701 - 01/14 0700 In: 876 [P.O.:870; I.V.:6] Out: -  Intake/Output this shift:    Medications Current Facility-Administered Medications  Medication Dose Route Frequency Provider Last Rate Last Dose  . albuterol (PROVENTIL) (5 MG/ML) 0.5% nebulizer solution 2.5 mg  2.5 mg Nebulization Q6H PRN Geradine Girt, DO      . beta carotene w/minerals (OCUVITE) tablet 1 tablet  1 tablet Oral QPM Geradine Girt, DO   1 tablet at 11/29/13 1710  . carvedilol (COREG) tablet 6.25 mg  6.25 mg Oral BID WC Geradine Girt, DO   6.25 mg at 11/30/13 0816  . ciprofloxacin (CIPRO) tablet 250 mg  250 mg Oral BID Kandice Hams, MD   250 mg at 11/29/13 2148  . colchicine tablet 0.6 mg  0.6 mg Oral Daily PRN Geradine Girt, DO   0.6 mg at 11/28/13 1013  . colchicine tablet 0.6 mg  0.6 mg Oral Daily Kandice Hams, MD   0.6 mg at 11/29/13 1021  . digoxin (LANOXIN) tablet 0.0625 mg  0.0625 mg Oral Daily Jessica U Vann, DO   0.0625 mg at 11/29/13 1021  . ferrous sulfate tablet 325 mg  325 mg Oral Q supper Geradine Girt, DO   325 mg at 11/29/13 1710  . guaiFENesin-dextromethorphan (ROBITUSSIN DM) 100-10 MG/5ML syrup 5 mL  5 mL Oral Q4H PRN Dianne Dun, NP   5 mL at 11/28/13 1538  . multivitamin with minerals tablet 1 tablet  1 tablet Oral Daily Geradine Girt, DO   1 tablet at 11/29/13 1021  . ondansetron (ZOFRAN) tablet 4 mg  4 mg Oral Q6H PRN Geradine Girt, DO       Or  . ondansetron (ZOFRAN) injection 4 mg  4 mg Intravenous Q6H PRN Geradine Girt, DO   4 mg at 11/28/13 0935  . potassium chloride SA  (K-DUR,KLOR-CON) CR tablet 20 mEq  20 mEq Oral q morning - 10a Jessica U Vann, DO   20 mEq at 11/29/13 1021  . sodium chloride 0.9 % injection 3 mL  3 mL Intravenous Q12H Geradine Girt, DO   3 mL at 11/29/13 2149  . traMADol (ULTRAM) tablet 25 mg  25 mg Oral Q6H PRN Geradine Girt, DO   25 mg at 11/28/13 1539  . Warfarin - Pharmacist Dosing Inpatient   Does not apply q1800 Dareen Piano, [facility]      . zolpidem [facility]) tablet 5 mg  5 mg Oral QHS PRN Dianne Dun, NP   5 mg at 11/29/13 2305    PE: General appearance: alert, cooperative and no distress Lungs: clear to auscultation bilaterally Heart: regular rate and rhythm, S1, S2 normal, no murmur, click, rub or gallop, regular rate and rhythm and 1/6 sys MM Extremities: No LEE Pulses: 2+ and symmetric Skin: Warm and dry Neurologic: Grossly normal  Lab Results:   Recent Labs  11/27/13 1450  WBC 8.5  HGB 10.0*  HCT 32.0*  PLT 134*   BMET  Recent Labs  11/27/13 1450  NA 143  K 3.9  CL 102  CO2 25  GLUCOSE 98  BUN 25*  CREATININE 1.46*  CALCIUM 8.0*   PT/INR  Recent Labs  11/28/13 0620 11/29/13 0328 11/30/13 0258  LABPROT 25.9* 27.0* 23.0*  INR 2.47* 2.61* 2.11*    Assessment/Plan    Active Problems:   CKD (chronic kidney disease) stage 3, GFR 30-59 ml/min   Biventricular cardiac pacemaker in situ   Syncope   UTI (lower urinary tract infection)  Plan:  V-pacing on tele.  Occasional PVCs.  Pause on tele was during PPM interrogation.  BP stable.  Not orthostatic.   LOS: 3 days    Dorothy Spark 11/30/2013 10:18 AM  Mrs Dimanche is a 80 y.o. female with a history of chronic combined systolic/diastolic CHF, severe MR s/p MV repair, chronic atrial fibrillation on coumadin, BiV PPM upgrade in 2007 and CKD, newly recognized cirrhosis, perirectal abcess with I&D on 10/14/13. 2D echo from 10/11/13 showed EF 35-40% with hypokinesis of the anteroseptal myocardium, mild MR, mod dil LA, PA pressure  54mmHg. The patient presented on 11/27/13 after having a witnessed syncopal episode. She was sitting for a prolonged time after which she stood up and walked to the kitchen and passed out at the kitchen counter and woke up few seconds later. She has no recollection of the event. Telemetry in the hospital shows two short runs of nsVT yesterday, however PM interrogation shows no arrhythmias at the time of the event.  An episode of nsVT just yesterday morning, asymptomatic, at 1937 pt had 18 beats non-sustained Vtach, patient reports feeling palpitations. Troponin was negative. There is no need for further work up of nsVT as they are rare and asymptomatic. She doesn't qualify for an ICD as her EF > 35%. Ideally we would like to increase her carvedilol, but there is not much room with her BP.  She can be discharged today and followed in our clinic as an outpatient. Syncope was most probably an episode of orthostatic hypotension in the settings of an acute infection/hypotension (UTI), she was switched to PO ATB today. It is ok to discharge and follow up as outpatient.   [name], [name] 11/30/2013

## 2013-11-30 NOTE — Care Management Note (Signed)
    Page 1 of 2   11/30/2013     12:18:08 PM   CARE MANAGEMENT NOTE 11/30/2013  Patient:  [name], [name]   Account Number:  [id number]  Date Initiated:  11/30/2013  Documentation initiated by:  Marvetta Gibbons  Subjective/Objective Assessment:   Pt admitted with syncope     Action/Plan:   PTA pt lived at home with daughter and family- with [facility] services active with [facility] for RN/PT/OT- will need resumption orders for discharge   Anticipated DC Date:  11/30/2013   Anticipated DC Plan:  [place]  CM consult      Choice offered to / List presented to:     DME arranged  OTHER - SEE COMMENT      DME agency  [place] arranged  [place]   Status of service:  Completed, signed off Medicare Important Message given?   (If response is "NO", the following Medicare IM given date fields will be blank) Date Medicare IM given:   Date Additional Medicare IM given:    Discharge Disposition:  [place]  Per UR Regulation:  Reviewed for med. necessity/level of care/duration of stay  If discussed at [place] of Stay Meetings, dates discussed:    Comments:  11/30/13- 1215- Marvetta Gibbons RN, BSN [phone] Pt for d/c today- order for DME- rollator- placed- [facility] has delivered to room-per Larkin Ina with [facility]- and resumption order for HH-PT placed- call made to [facility] with [facility] regarding resumption order for [place] to check with PCP to see if RN and OT services are to be resumed also-

## 2013-12-06 ENCOUNTER — Telehealth: Payer: Self-pay | Admitting: Pharmacist

## 2013-12-06 ENCOUNTER — Telehealth: Payer: Self-pay | Admitting: Cardiology

## 2013-12-06 ENCOUNTER — Ambulatory Visit (INDEPENDENT_AMBULATORY_CARE_PROVIDER_SITE_OTHER): Payer: Medicare Other | Admitting: General Surgery

## 2013-12-06 ENCOUNTER — Encounter (INDEPENDENT_AMBULATORY_CARE_PROVIDER_SITE_OTHER): Payer: Self-pay | Admitting: General Surgery

## 2013-12-06 VITALS — BP 128/82 | HR 82 | Temp 98.0°F | Resp 18 | Ht 62.0 in | Wt 197.0 lb

## 2013-12-06 DIAGNOSIS — Z09 Encounter for follow-up examination after completed treatment for conditions other than malignant neoplasm: Secondary | ICD-10-CM

## 2013-12-06 NOTE — Telephone Encounter (Signed)
I called [place] Nurse At Dr Bernell List office her BP was 82/42  For Nurse she stated her BP Sitting was 118/60 when she stood up it was 108/60. Nurse stated it was lower at Dr Bernell List office.   I will get the OV notes from Dr Delfina Redwood. Pt told Nurse she keeps falling due to BP dropping. She doesn't feel it until she is already falling.

## 2013-12-06 NOTE — Telephone Encounter (Signed)
Nurse wanted to decrease torsemide not Lasix. Med list is currently updated per Nurse except she has been put on a antibiotic due to a UTI

## 2013-12-06 NOTE — Telephone Encounter (Signed)
New message  Gloriann Loan with [facility] is wanting permission to decrease Lasix, her BP is bottoming out. Which is causing patient to fall. Please call and advise.

## 2013-12-06 NOTE — Telephone Encounter (Signed)
Please find out what BPs are running

## 2013-12-06 NOTE — Telephone Encounter (Signed)
Printed and put in the to be signed folder.

## 2013-12-06 NOTE — Telephone Encounter (Signed)
Patient discharged from hospital on 11/30/13 with INR of 2.11 and also on Cipro 250 mg bid.  Patient apparently fell two days ago per [place] Jackelyn Poling) and followed up with Dr. Donne Hazel today.  Patient did not hit head per nurse.  Patient is at home now, and no longer in [facility].  I gave [facility] orders to check an INR tomorrow (12/07/13) and call / fax results to [place].  Patient on warfarin 2.5 mg qd except 5 mg Monday since discharge last week.

## 2013-12-06 NOTE — Telephone Encounter (Signed)
Please get last note from Dr. Delfina Redwood and find out a list of her meds currently from [place]

## 2013-12-06 NOTE — Telephone Encounter (Signed)
To Dr Turner to advise 

## 2013-12-06 NOTE — Progress Notes (Signed)
Subjective:     Patient ID: [name], female   DOB: January 21, 1926, 80 y.o.   MRN: [id number]  HPI  This is an 80 year old female with multiple medical problems who was admitted to the hospital with a perirectal abscess. I took her to the operating room and did an incision and drainage of this. This had fistulized into her vagina. I opened this up in its entirety. She eventually was able to be discharged to home. She is now at a skilled nursing facility. She is basically just covering this now. She has been in hospital again and has been having falls as well as uti.    Review of Systems     Objective:   Physical Exam Wound is essentially healed at this point except for a small area that is granulating in, there is no evidence of any infection    Assessment:     Status post perirectal abscess incision and drainage     Plan:     I think this can just be kept clean and covered now. It is well on its way to healing. I will have her come back and see me as needed now.

## 2013-12-07 ENCOUNTER — Encounter: Payer: Self-pay | Admitting: Internal Medicine

## 2013-12-07 ENCOUNTER — Ambulatory Visit (INDEPENDENT_AMBULATORY_CARE_PROVIDER_SITE_OTHER): Payer: Medicare Other | Admitting: Internal Medicine

## 2013-12-07 VITALS — BP 87/56 | HR 69 | Ht 62.0 in

## 2013-12-07 DIAGNOSIS — I482 Chronic atrial fibrillation, unspecified: Secondary | ICD-10-CM

## 2013-12-07 DIAGNOSIS — R55 Syncope and collapse: Secondary | ICD-10-CM

## 2013-12-07 DIAGNOSIS — I5042 Chronic combined systolic (congestive) and diastolic (congestive) heart failure: Secondary | ICD-10-CM

## 2013-12-07 DIAGNOSIS — Z950 Presence of cardiac pacemaker: Secondary | ICD-10-CM

## 2013-12-07 LAB — MDC_IDC_ENUM_SESS_TYPE_INCLINIC
Date Time Interrogation Session: 20150121150106
Implantable Pulse Generator Model: 8042
Lead Channel Impedance Value: 0 Ohm
Lead Channel Impedance Value: 608 Ohm
Lead Channel Impedance Value: 699 Ohm
Lead Channel Pacing Threshold Amplitude: 0.5 V
Lead Channel Pacing Threshold Amplitude: 0.5 V
Lead Channel Pacing Threshold Pulse Width: 0.4 ms
Lead Channel Pacing Threshold Pulse Width: 0.4 ms
Lead Channel Setting Pacing Amplitude: 2 V
Lead Channel Setting Pacing Pulse Width: 0.4 ms
Lead Channel Setting Pacing Pulse Width: 0.4 ms
Lead Channel Setting Sensing Sensitivity: 2.8 mV
MDC IDC MSMT BATTERY REMAINING LONGEVITY: 28 mo
MDC IDC MSMT BATTERY VOLTAGE: 2.92 V
MDC IDC SET LEADCHNL RV PACING AMPLITUDE: 2 V
MDC IDC STAT BRADY RV PERCENT PACED: 99 %

## 2013-12-07 NOTE — Telephone Encounter (Signed)
[name] from [place] is aware and will be taking pt in for [place]

## 2013-12-07 NOTE — Patient Instructions (Addendum)
Your physician has recommended you make the following change in your medication:  1) Stop Demedex 2) Stop Carvedilol   Your physician recommends that you schedule a follow-up appointment in: 5 weeks with Dr. Lovena Le.

## 2013-12-07 NOTE — Progress Notes (Signed)
HPI [name] is referred today for evaluation of syncope. She is a pleasant 80 yo woman with a h/o chronic well compensated CHF, s/p BiV PPM insertion who was stable until November when she developed nausea, vomiting, diarrhea and was found to have a peri-rectal abscess for which she underwent I&D. Because of complications and subsequent debilitation, she stayed a month in rehab. Since then she will experience periods where she suddenly gets dizzy and passes out. She has very little warning. After passing out she awakens within a few minutes. She has gone to the ER and been admitted. Orthostatic vitals have demonstrated hypotension with standing. She has not had any spells while sitting or lying down. She has not bitten her tongue or lost control of bowel or bladder continence. While she was in the hospital several months ago, she developed volume overload and was treated with IV diuretic therapy and has been on Torsemide since. On the way into our office today, she fell to the ground while walking through the parking lot.  No Known Allergies   Current Outpatient Prescriptions  Medication Sig Dispense Refill  . acetaminophen (TYLENOL) 500 MG tablet Take 500 mg by mouth every 6 (six) hours as needed for mild pain.      [name] albuterol (PROVENTIL) (5 MG/ML) 0.5% nebulizer solution Take 0.5 mLs (2.5 mg total) by nebulization every 6 (six) hours as needed for wheezing or shortness of breath.  20 mL  12  . beta carotene w/minerals (OCUVITE) tablet Take 1 tablet by mouth every evening.       . cetirizine (ZYRTEC) 10 MG tablet Take 10 mg by mouth daily.      . ciprofloxacin (CIPRO) 250 MG tablet Take 1 tablet (250 mg total) by mouth 2 (two) times daily.      . colchicine 0.6 MG tablet Take 0.6 mg by mouth daily as needed (Gout flareups).       . digoxin (LANOXIN) 0.125 MG tablet Take 0.0625 mg by mouth daily.      . ferrous sulfate 325 (65 FE) MG tablet Take 325 mg by mouth daily with supper.         . meclizine (ANTIVERT) 12.5 MG tablet Take 12.5 mg by mouth 2 (two) times daily as needed for dizziness.      . Multiple Vitamin (MULTIVITAMIN WITH MINERALS) TABS tablet Take 1 tablet by mouth daily.      . Oxymetazoline HCl (NOSTRILLA CONGESTION RELIEF NA) Place 1 spray into the nose daily as needed (for congestion).      . potassium chloride SA (K-DUR,KLOR-CON) 20 MEQ tablet Take 20 mEq by mouth every morning.       . Pseudoephedrine-APAP-DM (DAYQUIL PO) Take 2 capsules by mouth once.      . traMADol (ULTRAM) 50 MG tablet Take 0.5 tablets (25 mg total) by mouth every 6 (six) hours as needed (headache).  30 tablet  0  . warfarin (COUMADIN) 5 MG tablet Take 2.5-5 mg by mouth daily. Take 2.5mg  daily, except take 5mg  on Mondays.      [name] zolpidem (AMBIEN) 5 MG tablet Take 5 mg by mouth at bedtime as needed for sleep.       No current facility-administered medications for this visit.     Past Medical History  Diagnosis Date  . Mitral valve disorder     a. Severe MR s/p repair 2003 (28 mm annuloplasty ring and and oversew of LAA). No CAD by cath at that  time.  . Pacemaker     a. 2003: post-op afib after MR repair then symptomatic bradycardia requiring pacemaker. b. Upgrade to Medtronic Bi-V Pacemaker 2007.  [name] Cirrhosis     a. Newly recognized 09/2013 - by CT.  . CKD (chronic kidney disease)   . Pulmonary hypertension, mild     secondary to mild/mod LV dysfunction(EF 40%)  . Diastolic dysfunction   . Spinal stenosis     shes recieved epidural steroid injections in the past  . Chronic combined systolic and diastolic CHF (congestive heart failure)     EF 35-40% on echo 09/2013  . Chronic atrial fibrillation     a. Previously failed DCCV/amio/tikosyn  . NSVT (nonsustained ventricular tachycardia)     a. Isolated event during 09/2007 adm.  . Brady-tachy syndrome     s/p permanent pacemaker placement  . Hypertension     ROS:   All systems reviewed and negative except as noted in the  HPI.   Past Surgical History  Procedure Laterality Date  . Valve replacement      sever mitral regurgitation s/p mitral valve annuloplasty ring  . Cholecystectomy    . Appendectomy    . Irrigation and debridement abscess N/A 10/14/2013    Procedure: IRRIGATION AND DEBRIDEMENT PERINEAL ABSCESS;  Surgeon: Rolm Bookbinder, MD;  Location: [facility] OR;  Service: General;  Laterality: N/A;     Family History  Problem Relation Age of Onset  . Pancreatic cancer Father   . Other Mother     Mother died at 65 with no real medical problems     History   Social History  . Marital Status: Single    Spouse Name: N/A    Number of Children: N/A  . Years of Education: N/A   Occupational History  . Not on file.   Social History Main Topics  . Smoking status: Former Smoker    Quit date: 10/11/1971  . Smokeless tobacco: Not on file  . Alcohol Use: 0.0 oz/week     Comment: rare  . Drug Use: No  . Sexual Activity: Not on file   Other Topics Concern  . Not on file   Social History Narrative  . No narrative on file     BP 87/56  Pulse 69  Ht 5\' 2"  (1.575 m)  LMP 10/10/2013 with standing, blood pressure dropped below 80 mm Hg.   Physical Exam:  Chronically ill appearing NAD HEENT: Unremarkable Neck:  No JVD, no thyromegally Back:  No CVA tenderness Lungs:  Clear with no wheezes HEART:  Regular rate rhythm, no murmurs, no rubs, no clicks Abd:  soft, positive bowel sounds, no organomegally, no rebound, no guarding Ext:  2 plus pulses, no edema, no cyanosis, no clubbing Skin:  No rashes no nodules Neuro:  CN II through XII intact, motor grossly intact   DEVICE  Normal device function.  See PaceArt for details.   Assess/Plan:

## 2013-12-07 NOTE — Telephone Encounter (Signed)
If her BP is still running low and she is falling due to orthostasis I recommend taking back to [facility] for evaluation

## 2013-12-07 NOTE — Telephone Encounter (Signed)
Pt saw Dr Joie Bimler and they stopped pts coreg and torsemide and was told to return in 5 weeks. Pt has fallen twice since being off the medications still. Pts daughter is wanting to know if we should have the pt admitted and then placed into a rehab facility?

## 2013-12-07 NOTE — Telephone Encounter (Signed)
I would like her to stop her Carvedilol and hold her diuretic and come in to see NP or PA on Friday 1/23

## 2013-12-07 NOTE — Assessment & Plan Note (Signed)
Interrogation of her BiV PPM demonstrates normal device function and no sustained ventricular arrhythmias.

## 2013-12-07 NOTE — Assessment & Plan Note (Signed)
Her symptoms are due to orthostasis. I suspect the torsemide is the big contributor along with her carvedilol. She is also deconditioned. I have given her 500 cc of normal saline IV. She will stop her diuretic and hold off on her carvedilol. I have encouraged the patient to increase her fluid and salt intake. While this could result in an exacerbation of her CHF, I am concerned about her passing out and possibly injuring herself. She will need close followup. I spent 45 minutes with the patient in the office today.

## 2013-12-08 ENCOUNTER — Ambulatory Visit (INDEPENDENT_AMBULATORY_CARE_PROVIDER_SITE_OTHER): Payer: Medicare Other | Admitting: Interventional Cardiology

## 2013-12-08 LAB — POCT INR: INR: 2.7

## 2013-12-12 ENCOUNTER — Encounter: Payer: Self-pay | Admitting: Cardiology

## 2013-12-12 ENCOUNTER — Ambulatory Visit (INDEPENDENT_AMBULATORY_CARE_PROVIDER_SITE_OTHER): Payer: Medicare Other | Admitting: Cardiology

## 2013-12-12 ENCOUNTER — Ambulatory Visit (INDEPENDENT_AMBULATORY_CARE_PROVIDER_SITE_OTHER): Payer: Medicare Other | Admitting: Pharmacist

## 2013-12-12 VITALS — BP 128/52 | HR 72 | Ht 62.0 in | Wt 202.0 lb

## 2013-12-12 DIAGNOSIS — I5042 Chronic combined systolic (congestive) and diastolic (congestive) heart failure: Secondary | ICD-10-CM

## 2013-12-12 DIAGNOSIS — N183 Chronic kidney disease, stage 3 unspecified: Secondary | ICD-10-CM

## 2013-12-12 DIAGNOSIS — I951 Orthostatic hypotension: Secondary | ICD-10-CM

## 2013-12-12 DIAGNOSIS — I059 Rheumatic mitral valve disease, unspecified: Secondary | ICD-10-CM

## 2013-12-12 DIAGNOSIS — Z7901 Long term (current) use of anticoagulants: Secondary | ICD-10-CM

## 2013-12-12 DIAGNOSIS — I482 Chronic atrial fibrillation, unspecified: Secondary | ICD-10-CM

## 2013-12-12 DIAGNOSIS — I4891 Unspecified atrial fibrillation: Secondary | ICD-10-CM

## 2013-12-12 DIAGNOSIS — Z5181 Encounter for therapeutic drug level monitoring: Secondary | ICD-10-CM | POA: Insufficient documentation

## 2013-12-12 DIAGNOSIS — Z950 Presence of cardiac pacemaker: Secondary | ICD-10-CM

## 2013-12-12 DIAGNOSIS — I509 Heart failure, unspecified: Secondary | ICD-10-CM

## 2013-12-12 DIAGNOSIS — I10 Essential (primary) hypertension: Secondary | ICD-10-CM

## 2013-12-12 LAB — POCT INR: INR: 2.9

## 2013-12-12 MED ORDER — MIDODRINE HCL 2.5 MG PO TABS: 2.5000 mg | ORAL_TABLET | Freq: Three times a day (TID) | ORAL | Status: DC

## 2013-12-12 NOTE — Patient Instructions (Addendum)
Your physician has recommended you make the following change in your medication: Start Midodrine 2.5 MG Three times daily. This has been sent into the pharmacy for you.   Your physician recommends that you go to the lab today for a Digoxin Level.  We are giving you a rx for TED Stockings. You can get these done at [facility] [place], [place] [phone]  Your physician recommends that you schedule a follow-up appointment on Thursday with Nurse to check BP  Your physician recommends that you schedule a follow-up appointment in: 3 Weeks with Dr Radford Pax

## 2013-12-12 NOTE — Progress Notes (Signed)
[address], [place], [place]  [address] Phone: [phone] Fax:  [phone]  Date:  12/12/2013   ID:  [name], DOB 08-Dec-1925, MRN [id number]  PCP:  Kandice Hams, MD  Cardiologist:  [name], MD     History of Present Illness: [name]  is a pleasant 80 yo woman with a h/o chronic well compensated CHF, s/p BiV PPM insertion who was stable until November when she developed nausea, vomiting, diarrhea and was found to have a peri-rectal abscess for which she underwent I&D. Because of complications and subsequent debilitation, she stayed a month in rehab. Since then she will experience periods where she suddenly gets dizzy and passes out. She has very little warning. After passing out she awakens within a few minutes. She has gone to the ER and been admitted. Orthostatic vitals have demonstrated hypotension with standing. She has not had any spells while sitting or lying down. She has not bitten her tongue or lost control of bowel or bladder continence. While she was in the hospital several months ago, she developed volume overload and was treated with IV diuretic therapy and has been on Torsemide since. She was recently seen by Dr. Lovena Le for evaluation of syncope and on the way the office, she fell to the ground while walking through the parking lot. At that OV interrogation of her device showed normal device function.  It was felt that her symptoms were due to orthostasis along with carvedilol.  She received a 500cc IV saline bolus in the office.  Her diuretic was stopped and her carvedilol was stopped.  She presents back today for followup.  She is doing much better.  Her falling have completely resolved.  Her weight has been stable around 195-199lbs.  She still has some mild dizziness when she goes from sitting to standing.  She denies any SOB or DOE unless her back starts to hurt.  She denies any chest pressure.  She has not had any LE edema.     Wt Readings  from Last 3 Encounters:  12/12/13 202 lb (91.627 kg)  12/06/13 197 lb (89.359 kg)  11/07/13 204 lb 9.6 oz (92.806 kg)     Past Medical History  Diagnosis Date  . Mitral valve disorder     a. Severe MR s/p repair 2003 (28 mm annuloplasty ring and and oversew of LAA). No CAD by cath at that time.  . Pacemaker     a. 2003: post-op afib after MR repair then symptomatic bradycardia requiring pacemaker. b. Upgrade to Medtronic Bi-V Pacemaker 2007.  [name] Cirrhosis     a. Newly recognized 09/2013 - by CT.  . CKD (chronic kidney disease)   . Pulmonary hypertension, mild     secondary to mild/mod LV dysfunction(EF 40%)  . Diastolic dysfunction   . Spinal stenosis     shes recieved epidural steroid injections in the past  . Chronic combined systolic and diastolic CHF (congestive heart failure)     EF 35-40% on echo 09/2013  . Chronic atrial fibrillation     a. Previously failed DCCV/amio/tikosyn  . NSVT (nonsustained ventricular tachycardia)     a. Isolated event during 09/2007 adm.  . Brady-tachy syndrome     s/p permanent pacemaker placement  . Hypertension     Current Outpatient Prescriptions  Medication Sig Dispense Refill  . acetaminophen (TYLENOL) 500 MG tablet Take 500 mg by mouth every 6 (six) hours as needed for mild pain.      [name]  beta carotene w/minerals (OCUVITE) tablet Take 1 tablet by mouth every evening.       . cetirizine (ZYRTEC) 10 MG tablet Take 10 mg by mouth daily.      . colchicine 0.6 MG tablet Take 0.6 mg by mouth daily as needed (Gout flareups).       . digoxin (LANOXIN) 0.125 MG tablet Take 0.0625 mg by mouth daily.      . ferrous sulfate 325 (65 FE) MG tablet Take 325 mg by mouth daily with supper.       . meclizine (ANTIVERT) 12.5 MG tablet Take 12.5 mg by mouth 2 (two) times daily as needed for dizziness.      . Multiple Vitamin (MULTIVITAMIN WITH MINERALS) TABS tablet Take 1 tablet by mouth daily.      . Oxymetazoline HCl (NOSTRILLA CONGESTION RELIEF NA) Place 1  spray into the nose daily as needed (for congestion).      . warfarin (COUMADIN) 5 MG tablet Take 2.5-5 mg by mouth daily. Take 2.5mg  daily, except take 5mg  on Mondays.      [name] zolpidem (AMBIEN) 5 MG tablet Take 5 mg by mouth at bedtime as needed for sleep.      . potassium chloride SA (K-DUR,KLOR-CON) 20 MEQ tablet Take 20 mEq by mouth every morning.        No current facility-administered medications for this visit.    Allergies:   No Known Allergies  Social History:  The patient  reports that she quit smoking about 42 years ago. She does not have any smokeless tobacco history on file. She reports that she drinks alcohol. She reports that she does not use illicit drugs.   Family History:  [name] family history includes Other in her mother; Pancreatic cancer in her father.   ROS:  Please see the history of present illness.      All other systems reviewed and negative.   PHYSICAL EXAM: VS:  BP 128/52  Pulse 72  Ht 5\' 2"  (1.575 m)  Wt 202 lb (91.627 kg)  BMI 36.94 kg/m2  LMP 10/10/2013 Well nourished, well developed, in no acute distress HEENT: normal Neck: no JVD Cardiac:  normal S1, S2; RRR; no murmur Lungs:  clear to auscultation bilaterally, no wheezing, rhonchi or rales Abd: soft, nontender, no hepatomegaly Ext: no edema Skin: warm and dry Neuro:  CNs 2-12 intact, no focal abnormalities noted  EKG:  V paced rhythm     ASSESSMENT AND PLAN:  1. Chronic combined systolic/diastolic CHF - appears well compensated off diuretics and beta blockers.  She is following a low sodium diet.  She does not tolerate diuretics or beta blocker due to significant orthostasis 2. MV disease s/p MV repair 3. HTN - controlled of BP meds 4. Chronic atrial fibrillation rate controlled  - continue Digoxin   - check dig level 5.  Tachybrady syndrome s/p PPM with upgrade to BiVPPM 6.  Orthostatic hypotension with falls - the falls have resolved after stopping diuretics.  She occasionally is  dizzy if going from sitting to standing and orthostatic Bps today showed sitting BP 116/52 mmHg which drops to 80/69mmHg with standing.  Cannot use Florinef due to history of CHF.  Will start midodrine 2.5mg  TID along with TED hose stockings and she will come in for Orthostatic BP check on Friday with nurse  Followup with me in 3 weeks  Signed, [name], MD 12/12/2013 1:32 PM

## 2013-12-13 LAB — DIGOXIN LEVEL: Digoxin Level: 0.7 ng/mL — ABNORMAL LOW (ref 0.8–2.0)

## 2013-12-14 ENCOUNTER — Telehealth: Payer: Self-pay | Admitting: General Surgery

## 2013-12-14 ENCOUNTER — Encounter: Payer: Self-pay | Admitting: General Surgery

## 2013-12-14 NOTE — Telephone Encounter (Signed)
Senaida Ores [facility] nurse has to go out to pt home on Thursday to do INR check, she states she will do Orthostatic BP that Dr Radford Pax wants and fax to office so that pt doesn't have to come in, is that ok? If so please cancel appt for BP check on 12/15/13. Amber # [phone]   Dr Radford Pax stated this was ok. I called Amber to make aware. Cancelled Appointment for pt.

## 2013-12-14 NOTE — Telephone Encounter (Signed)
Message copied by Lily Kocher on Wed Dec 14, 2013  9:50 AM ------      Message from: MUSE, [place] J      Created: Tue Dec 13, 2013  4:13 PM      Regarding: Orthostatic BP       Senaida Ores [facility] nurse has to go out to pt home on Thursday to do INR check, she states she will do Orthostatic BP that Dr Radford Pax wants and fax to office so that pt doesn't have to come in, is that ok? If so please cancel appt for BP check on 12/15/13. Amber # [phone] ------

## 2013-12-15 ENCOUNTER — Ambulatory Visit (INDEPENDENT_AMBULATORY_CARE_PROVIDER_SITE_OTHER): Payer: Medicare Other | Admitting: Internal Medicine

## 2013-12-15 DIAGNOSIS — Z5181 Encounter for therapeutic drug level monitoring: Secondary | ICD-10-CM

## 2013-12-15 LAB — POCT INR: INR: 2.4

## 2013-12-16 ENCOUNTER — Encounter: Payer: Self-pay | Admitting: Internal Medicine

## 2013-12-16 ENCOUNTER — Other Ambulatory Visit: Payer: Self-pay | Admitting: Nurse Practitioner

## 2013-12-19 ENCOUNTER — Telehealth: Payer: Self-pay | Admitting: General Surgery

## 2013-12-19 ENCOUNTER — Telehealth: Payer: Self-pay | Admitting: Cardiology

## 2013-12-19 MED ORDER — MIDODRINE HCL 5 MG PO TABS: 5.0000 mg | ORAL_TABLET | Freq: Three times a day (TID) | ORAL | Status: DC

## 2013-12-19 MED ORDER — MIDODRINE HCL 2.5 MG PO TABS: 2.5000 mg | ORAL_TABLET | Freq: Three times a day (TID) | ORAL | Status: DC

## 2013-12-19 NOTE — Addendum Note (Signed)
Addended by: Lily Kocher on: 12/19/2013 05:08 PM   Modules accepted: Orders

## 2013-12-19 NOTE — Telephone Encounter (Signed)
[facility] sent over pts Orthostatic BP readings and they are as followed  [name] BP 152/72 Sitting BP 142/68 Standing 138/60  Dr Radford Pax wants to increase Midodrine to 5 MG TID and repeat Orthostatic BP on Friday 12/23/12.  To Dr Radford Pax to sign off.

## 2013-12-19 NOTE — Telephone Encounter (Signed)
After review of patient's chart she has mild renal insuff with creatinine 1.4.  She is still having dizziness and BP drops slightly but still is in normal range.  Please continue current dose of Midodrine to 2.5mg  TID and make sure she is wearing her TED hose stockings during the day.

## 2013-12-19 NOTE — Telephone Encounter (Signed)
Called Pharmacy and made them aware. Faxed Form over to [facility]

## 2013-12-21 ENCOUNTER — Ambulatory Visit (INDEPENDENT_AMBULATORY_CARE_PROVIDER_SITE_OTHER): Payer: Medicare Other | Admitting: Pharmacist

## 2013-12-21 DIAGNOSIS — Z5181 Encounter for therapeutic drug level monitoring: Secondary | ICD-10-CM

## 2013-12-21 LAB — POCT INR: INR: 1.6

## 2013-12-26 ENCOUNTER — Encounter: Payer: Self-pay | Admitting: Cardiology

## 2013-12-26 ENCOUNTER — Ambulatory Visit (INDEPENDENT_AMBULATORY_CARE_PROVIDER_SITE_OTHER): Payer: Medicare Other | Admitting: Cardiology

## 2013-12-26 ENCOUNTER — Telehealth: Payer: Self-pay | Admitting: Cardiology

## 2013-12-26 VITALS — BP 140/70 | HR 92 | Ht 62.0 in | Wt 212.4 lb

## 2013-12-26 DIAGNOSIS — Z7901 Long term (current) use of anticoagulants: Secondary | ICD-10-CM

## 2013-12-26 DIAGNOSIS — I509 Heart failure, unspecified: Secondary | ICD-10-CM

## 2013-12-26 DIAGNOSIS — I5042 Chronic combined systolic (congestive) and diastolic (congestive) heart failure: Secondary | ICD-10-CM

## 2013-12-26 DIAGNOSIS — N183 Chronic kidney disease, stage 3 unspecified: Secondary | ICD-10-CM

## 2013-12-26 DIAGNOSIS — I951 Orthostatic hypotension: Secondary | ICD-10-CM

## 2013-12-26 MED ORDER — FUROSEMIDE 40 MG PO TABS: ORAL_TABLET | ORAL | Status: DC

## 2013-12-26 MED ORDER — POTASSIUM CHLORIDE CRYS ER 20 MEQ PO TBCR
EXTENDED_RELEASE_TABLET | ORAL | Status: DC
Start: 2013-12-26 — End: 2014-04-12

## 2013-12-26 NOTE — Patient Instructions (Addendum)
Your physician has recommended you make the following change in your medication:   1. Start back taking lasix 40 mg daily until weight is down to 205lbs. Once weight is at 205lbs only use lasix as needed to keep weight at or around 205lbs.   2. Start back taking potassium 20 meq on the days that you take lasix.  Your physician recommends that you schedule a follow-up as scheduled with Dr. Radford Pax on 01/05/14.

## 2013-12-26 NOTE — Telephone Encounter (Signed)
Please have her come in to see PA in office today

## 2013-12-26 NOTE — Telephone Encounter (Signed)
Pts daughter will have her see Nicki Reaper today at 3:40. Made Leta Speller Nurse aware.

## 2013-12-26 NOTE — Telephone Encounter (Signed)
New message    Home from hospital for 3-4wks.  Pt states that she has gained 10lbs.  Pt is off lasix.  Please advise.

## 2013-12-26 NOTE — Assessment & Plan Note (Signed)
The patient is definitely wet today. We know that at 200 pounds she was relatively stable with midodrine added. At 210 pounds she is fluid overloaded. She has no orthostatic blood pressure drop at this weight today. I am hopeful that a weight of 205 pounds may work for her. I had a careful discussion with the patient and her daughter discussing this. We decided to restart Lasix at 40 mg daily. She will weigh herself carefully each day. If she gets to 205 she will hold the Lasix. Above 205 she will take the Lasix. I am having her take her potassium when she takes her Lasix. She will continue the same dose of midodrine.   The choice of 205 pounds is arbitrary. It may be that there is a better weight between 200 and 210 pounds. But this will be our first start. She has an early followup appointment with Dr. Radford Pax. If this plan his not working she will be in touch with Dr. Theodosia Blender team.  The same plan will be continued concerning her salt intake that was put in place by Dr. Radford Pax. The patient is not restricting salt but also being careful not to take excessive salt. In my opinion the patient is taking excess water. She drinks extra fluid because she has been told this will be good for her. I explained that if she cuts this back to moderate fluids, she may not require as much in the way of diuretics.

## 2013-12-26 NOTE — Telephone Encounter (Signed)
Noted. I will let her know at appt today.

## 2013-12-26 NOTE — Telephone Encounter (Signed)
Do You still want her keeping her appt for the 19th as well Dr Radford Pax since she is seeing Nicki Reaper today?

## 2013-12-26 NOTE — Assessment & Plan Note (Addendum)
The issues concerning her orthostasis are addressed in my discussion about her heart failure. In her case, I do believe that there can be a case for using midodrine and a diuretic at the same time.  As part of today's evaluation I spent greater than 25 minutes with a total care. More than half of this time was with direct contact with the patient and her daughter discussing the overall approach.

## 2013-12-26 NOTE — Progress Notes (Signed)
HPI  The patient has chronic combined systolic/diastolic CHF. She is followed carefully by Dr. Radford Pax who saw her last in the office December 12, 2013. She also has very severe orthostatic hypotension. With this combination of problems it has been very difficult to adjust medications. Most recently it was noted that she did not tolerate diuretics or beta blockers because of her orthostasis. When she was seen most recently in the office decision was made to start midodrine. This was being used as an outpatient. The plan was to use this and TED stockings. She has not been on any diuretic.  The patient called today to say that she had gained 10 pounds and that she needed to be seen in the office. Therefore she was added to my schedule. She weighs herself reliably each day. Her weight at home has gone up from 200-210 pounds. She is having some exertional shortness of breath. She also has a mild cough. I suspect the cough is volume related.  No Known Allergies  Current Outpatient Prescriptions  Medication Sig Dispense Refill  . acetaminophen (TYLENOL) 500 MG tablet Take 500 mg by mouth every 6 (six) hours as needed for mild pain.      . beta carotene w/minerals (OCUVITE) tablet Take 1 tablet by mouth every evening.       . cetirizine (ZYRTEC) 10 MG tablet Take 10 mg by mouth daily.      . colchicine 0.6 MG tablet Take 0.6 mg by mouth daily as needed (Gout flareups).       . digoxin (LANOXIN) 0.125 MG tablet Take 0.0625 mg by mouth daily.      . ferrous sulfate 325 (65 FE) MG tablet Take 325 mg by mouth daily with supper.       . meclizine (ANTIVERT) 12.5 MG tablet Take 12.5 mg by mouth 2 (two) times daily as needed for dizziness.      . midodrine (PROAMATINE) 2.5 MG tablet Take 1 tablet (2.5 mg total) by mouth 3 (three) times daily with meals.  90 tablet  11  . Multiple Vitamin (MULTIVITAMIN WITH MINERALS) TABS tablet Take 1 tablet by mouth daily.      . Oxymetazoline HCl (NOSTRILLA CONGESTION  RELIEF NA) Place 1 spray into the nose daily as needed (for congestion).      . warfarin (COUMADIN) 5 MG tablet Take 2.5-5 mg by mouth daily. Take 2.5mg  daily, except take 5mg  on Mondays.      [name] zolpidem (AMBIEN) 5 MG tablet Take 5 mg by mouth at bedtime as needed for sleep.       No current facility-administered medications for this visit.    History   Social History  . Marital Status: Single    Spouse Name: N/A    Number of Children: N/A  . Years of Education: N/A   Occupational History  . Not on file.   Social History Main Topics  . Smoking status: Former Smoker    Quit date: 10/11/1971  . Smokeless tobacco: Not on file  . Alcohol Use: 0.0 oz/week     Comment: rare  . Drug Use: No  . Sexual Activity: Not on file   Other Topics Concern  . Not on file   Social History Narrative  . No narrative on file    Family History  Problem Relation Age of Onset  . Pancreatic cancer Father   . Other Mother     Mother died at 11 with no real medical problems  Past Medical History  Diagnosis Date  . Mitral valve disorder     a. Severe MR s/p repair 2003 (28 mm annuloplasty ring and and oversew of LAA). No CAD by cath at that time.  . Pacemaker     a. 2003: post-op afib after MR repair then symptomatic bradycardia requiring pacemaker. b. Upgrade to Medtronic Bi-V Pacemaker 2007.  [name] Cirrhosis     a. Newly recognized 09/2013 - by CT.  . CKD (chronic kidney disease)   . Pulmonary hypertension, mild     secondary to mild/mod LV dysfunction(EF 40%)  . Diastolic dysfunction   . Spinal stenosis     shes recieved epidural steroid injections in the past  . Chronic combined systolic and diastolic CHF (congestive heart failure)     EF 35-40% on echo 09/2013  . Chronic atrial fibrillation     a. Previously failed DCCV/amio/tikosyn  . NSVT (nonsustained ventricular tachycardia)     a. Isolated event during 09/2007 adm.  . Brady-tachy syndrome     s/p permanent pacemaker placement    . Hypertension     Past Surgical History  Procedure Laterality Date  . Valve replacement      sever mitral regurgitation s/p mitral valve annuloplasty ring  . Cholecystectomy    . Appendectomy    . Irrigation and debridement abscess N/A 10/14/2013    Procedure: IRRIGATION AND DEBRIDEMENT PERINEAL ABSCESS;  Surgeon: Rolm Bookbinder, MD;  Location: [facility];  Service: General;  Laterality: N/A;    Patient Active Problem List   Diagnosis Date Noted  . Encounter for therapeutic drug monitoring 12/12/2013  . UTI (lower urinary tract infection) 11/27/2013  . Gout 11/07/2013  . Orthostatic hypotension 10/31/2013  . Chronic combined systolic and diastolic CHF (congestive heart failure)   . Hypertension   . Mitral valve disorder   . Chronic a-fib 10/28/2013  . Biventricular cardiac pacemaker in situ 10/14/2013  . Perirectal abscess 10/13/2013  . Chronic anticoagulation 10/10/2013  . Liver cirrhosis 10/10/2013  . Thrombocytopenia 10/10/2013  . Nausea and vomiting 10/10/2013  . CKD (chronic kidney disease) stage 3, GFR 30-59 ml/min 10/10/2013  . Anemia 10/10/2013    ROS   Patient denies fever, chills, headache, sweats, rash, change in vision, change in hearing, chest pain, nausea or vomiting, urinary symptoms. All other systems are reviewed and are negative.  PHYSICAL EXAM   Patient is oriented to person time and place. Affect is normal. She is here with her daughter who knows her medical status very well. There is no jugulovenous distention. There are a few basilar rales. Cardiac exam reveals S1 and S2. The abdomen is protuberant but soft. She does have mild pitting in her legs. There are no skin rashes.   Orthostatic blood pressure check was done. There is no orthostasis today.   ASSESSMENT & PLAN

## 2013-12-26 NOTE — Telephone Encounter (Signed)
yes

## 2013-12-26 NOTE — Telephone Encounter (Signed)
To Amy please make sure pt keeps appt with Dr Radford Pax on 2/19 Thanks again!

## 2013-12-26 NOTE — Assessment & Plan Note (Signed)
The patient will continue her current anticoagulation.

## 2013-12-26 NOTE — Assessment & Plan Note (Signed)
She had recent labs. I've chosen not to repeat labs today.

## 2013-12-26 NOTE — Telephone Encounter (Signed)
To Dr Turner to advise 

## 2013-12-29 ENCOUNTER — Ambulatory Visit (INDEPENDENT_AMBULATORY_CARE_PROVIDER_SITE_OTHER): Payer: Medicare Other | Admitting: Pharmacist

## 2013-12-29 ENCOUNTER — Encounter: Payer: Self-pay | Admitting: Internal Medicine

## 2013-12-29 DIAGNOSIS — D62 Acute posthemorrhagic anemia: Secondary | ICD-10-CM | POA: Insufficient documentation

## 2013-12-29 DIAGNOSIS — Z5181 Encounter for therapeutic drug level monitoring: Secondary | ICD-10-CM

## 2013-12-29 LAB — POCT INR: INR: 1.7

## 2013-12-29 NOTE — Progress Notes (Signed)
Patient ID: [name], female   DOB: 1926/03/09, 80 y.o.   MRN: [id number]               HISTORY & PHYSICAL  DATE: 10/26/2013     FACILITY: [facility]  LEVEL OF CARE: SNF (31)  ALLERGIES:  No Known Allergies  CHIEF COMPLAINT:  Manage perirectal abscess, CHF, and chronic kidney disease stage III.    HISTORY OF PRESENT ILLNESS:  The patient is an 80 year-old, Caucasian female who was hospitalized secondary to nausea and vomiting.  After hospitalization, she is admitted to this facility for short-term rehabilitation.    PERIRECTAL ABSCESS:  Patient was found to have a left labia, perirectal abscess with fistulization to the vagina.  She is status post incision and drainage.  Wound cultures negative.  She was treated with vancomycin and Zosyn.  She denies any rectal pain.    CHF:The patient does not relate significant weight changes, denies sob, DOE, orthopnea, PNDs, palpitations or chest pain.  CHF remains stable.  No complications form the medications being used.  She has  chronic lower extremity swelling.    CHRONIC KIDNEY DISEASE:  The patient has a history of chronic kidney disease stage III.   [name] chronic kidney disease remains stable.  Patient denies increasing lower extremity swelling or confusion. Last BUN and creatinine are:    Creatinine 1.3.    PAST MEDICAL HISTORY :  Past Medical History  Diagnosis Date  . Mitral valve disorder     a. Severe MR s/p repair 2003 (28 mm annuloplasty ring and and oversew of LAA). No CAD by cath at that time.  . Pacemaker     a. 2003: post-op afib after MR repair then symptomatic bradycardia requiring pacemaker. b. Upgrade to Medtronic Bi-V Pacemaker 2007.  [name] Cirrhosis     a. Newly recognized 09/2013 - by CT.  . CKD (chronic kidney disease)   . Pulmonary hypertension, mild     secondary to mild/mod LV dysfunction(EF 40%)  . Diastolic dysfunction   . Spinal stenosis     shes recieved epidural steroid  injections in the past  . Chronic combined systolic and diastolic CHF (congestive heart failure)     EF 35-40% on echo 09/2013  . Chronic atrial fibrillation     a. Previously failed DCCV/amio/tikosyn  . NSVT (nonsustained ventricular tachycardia)     a. Isolated event during 09/2007 adm.  . Brady-tachy syndrome     s/p permanent pacemaker placement  . Hypertension     PAST SURGICAL HISTORY: Past Surgical History  Procedure Laterality Date  . Valve replacement      sever mitral regurgitation s/p mitral valve annuloplasty ring  . Cholecystectomy    . Appendectomy    . Irrigation and debridement abscess N/A 10/14/2013    Procedure: IRRIGATION AND DEBRIDEMENT PERINEAL ABSCESS;  Surgeon: Rolm Bookbinder, MD;  Location: [facility];  Service: General;  Laterality: N/A;    SOCIAL HISTORY:  reports that she quit smoking about 42 years ago. She does not have any smokeless tobacco history on file. She reports that she drinks alcohol. She reports that she does not use illicit drugs.  FAMILY HISTORY:  Family History  Problem Relation Age of Onset  . Pancreatic cancer Father   . Other Mother     Mother died at 69 with no real medical problems    CURRENT MEDICATIONS: Reviewed per [facility]  REVIEW OF SYSTEMS:  See HPI otherwise 14 point  ROS is negative.  PHYSICAL EXAMINATION  VS:  T 98.7        P 68      RR 18      BP 98/45        GENERAL: no acute distress, moderately obese body habitus EYES: conjunctivae normal, sclerae normal, normal eye lids MOUTH/THROAT: lips without lesions,no lesions in the mouth,tongue is without lesions,uvula elevates in midline NECK: supple, trachea midline, no neck masses, no thyroid tenderness, no thyromegaly LYMPHATICS: no LAN in the neck, no supraclavicular LAN RESPIRATORY: breathing is even & unlabored, BS CTAB CARDIAC: RRR, no murmur,no extra heart sounds EDEMA/VARICOSITIES:  +2 bilateral lower extremity edema  ARTERIAL:  pedal pulses nonpalpable    GI:    ABDOMEN: abdomen soft, normal BS, no masses, no tenderness  LIVER/SPLEEN: no hepatomegaly, no splenomegaly MUSCULOSKELETAL: HEAD: normal to inspection & palpation BACK: no kyphosis, scoliosis or spinal processes tenderness EXTREMITIES: LEFT UPPER EXTREMITY: full range of motion, normal strength & tone RIGHT UPPER EXTREMITY:  full range of motion, normal strength & tone LEFT LOWER EXTREMITY: strength intact, range of motion moderate   RIGHT LOWER EXTREMITY: strength intact, range of motion moderate    PSYCHIATRIC: the patient is alert & oriented to person, affect & behavior appropriate  LABS/RADIOLOGY: CT of the abdomen and pelvis:  Showed diverticulosis without diverticulitis.    Chest x-ray:  No acute disease.    Urine culture showed no growth.    MRSA by PCR negative.     C.diff by PCR negative.    Labs reviewed: Basic Metabolic Panel:  Recent Labs  10/11/13 1149  10/20/13 0500 10/31/13 1030 11/27/13 1450 11/28/13 0043  NA 139  < > 139 141 143  --   K 3.8  < > 3.4* 3.7 3.9  --   CL 104  < > 97 102 102  --   CO2 22  < > 25 28 25   --   GLUCOSE 133*  < > 93 86 98  --   BUN 35*  < > 17 39* 25*  --   CREATININE 1.90*  < > 1.30* 1.7* 1.46*  --   CALCIUM 8.0*  < > 7.5* 8.4 8.0*  --   MG 2.1  --   --   --   --  2.1  < > = values in this interval not displayed. Liver Function Tests:  Recent Labs  10/10/13 1204 11/27/13 1450  AST 12 24  ALT 7 17  ALKPHOS 108 153*  BILITOT 2.1* 1.0  PROT 6.1 7.2  ALBUMIN 3.1* 3.0*   CBC:  Recent Labs  10/19/13 0550 10/20/13 0500 11/27/13 1450  WBC 9.2 8.8 8.5  NEUTROABS  --   --  5.1  HGB 7.3* 7.6* 10.0*  HCT 23.1* 24.7* 32.0*  MCV 86.2 85.8 86.0  PLT 206 227 134*   Cardiac Enzymes:  Recent Labs  11/27/13 2055 11/28/13 0043 11/28/13 0755  TROPONINI <0.30 <0.30 <0.30   CBG:  Recent Labs  11/27/13 1600  GLUCAP 101*   ASSESSMENT/PLAN:  Perirectal abscess.  Status post incision and drainage.    CHF.  Well  compensated.    Chronic kidney disease stage III.  Reassess.    Acute blood loss anemia.  Reassess.    Atrial fibrillation.  Rate controlled.    Check CBC and BMP.    THN Metrics:   Not on aspirin.  Former smoker.    I have reviewed patient's medical records received at admission/from hospitalization.  CPT CODE: 40347

## 2014-01-02 ENCOUNTER — Ambulatory Visit: Payer: Medicare Other | Admitting: Physician Assistant

## 2014-01-03 ENCOUNTER — Ambulatory Visit: Payer: Medicare Other | Admitting: Physician Assistant

## 2014-01-05 ENCOUNTER — Ambulatory Visit (INDEPENDENT_AMBULATORY_CARE_PROVIDER_SITE_OTHER): Payer: Medicare Other | Admitting: Pharmacist

## 2014-01-05 ENCOUNTER — Encounter: Payer: Self-pay | Admitting: Cardiology

## 2014-01-05 ENCOUNTER — Ambulatory Visit (INDEPENDENT_AMBULATORY_CARE_PROVIDER_SITE_OTHER): Payer: Medicare Other | Admitting: Cardiology

## 2014-01-05 VITALS — BP 128/64 | HR 80 | Ht 62.0 in | Wt 206.8 lb

## 2014-01-05 DIAGNOSIS — I4891 Unspecified atrial fibrillation: Secondary | ICD-10-CM

## 2014-01-05 DIAGNOSIS — Z9889 Other specified postprocedural states: Secondary | ICD-10-CM

## 2014-01-05 DIAGNOSIS — I509 Heart failure, unspecified: Secondary | ICD-10-CM

## 2014-01-05 DIAGNOSIS — I059 Rheumatic mitral valve disease, unspecified: Secondary | ICD-10-CM

## 2014-01-05 DIAGNOSIS — I482 Chronic atrial fibrillation, unspecified: Secondary | ICD-10-CM

## 2014-01-05 DIAGNOSIS — I5042 Chronic combined systolic (congestive) and diastolic (congestive) heart failure: Secondary | ICD-10-CM

## 2014-01-05 DIAGNOSIS — Z950 Presence of cardiac pacemaker: Secondary | ICD-10-CM

## 2014-01-05 DIAGNOSIS — I340 Nonrheumatic mitral (valve) insufficiency: Secondary | ICD-10-CM

## 2014-01-05 DIAGNOSIS — Z5181 Encounter for therapeutic drug level monitoring: Secondary | ICD-10-CM

## 2014-01-05 DIAGNOSIS — I951 Orthostatic hypotension: Secondary | ICD-10-CM

## 2014-01-05 DIAGNOSIS — Z7901 Long term (current) use of anticoagulants: Secondary | ICD-10-CM

## 2014-01-05 LAB — POCT INR: INR: 2

## 2014-01-05 LAB — BASIC METABOLIC PANEL
BUN: 17 mg/dL (ref 6–23)
CHLORIDE: 109 meq/L (ref 96–112)
CO2: 26 mEq/L (ref 19–32)
Calcium: 8.7 mg/dL (ref 8.4–10.5)
Creatinine, Ser: 1.1 mg/dL (ref 0.4–1.2)
GFR: 49.62 mL/min — ABNORMAL LOW (ref >60.00)
GLUCOSE: 102 mg/dL — AB (ref 70–99)
POTASSIUM: 3.9 meq/L (ref 3.5–5.1)
Sodium: 142 mEq/L (ref 135–145)

## 2014-01-05 NOTE — Progress Notes (Signed)
[address] [place], [place]  [address] Phone: [phone] Fax:  [phone]  Date:  01/05/2014   ID:  [name], DOB 08-21-1926, MRN [id number]  PCP:  Kandice Hams, MD  Cardiologist:  [name], MD     History of Present Illness:  [name], [place]  [place], [place] [address]  Phone: [phone]  Fax: [phone]  Date: 12/12/2013  ID: [name], DOB 04-14-1926, MRN [id number]  PCP: Kandice Hams, MD  Cardiologist: [name], MD  History of Present Illness:  [name] is a pleasant 80 yo woman with a h/o chronic well compensated CHF, s/p BiV PPM insertion who was stable until November when she developed nausea, vomiting, diarrhea and was found to have a peri-rectal abscess for which she underwent I&D. Because of complications and subsequent debilitation, she stayed a month in rehab.She then started having periods where she suddenly would get dizzy and pass out. She would have very little warning. After passing out she would awaken within a few minutes. Orthostatic vitals have demonstrated hypotension with standing. She has not had any spells while sitting or lying down. While she was in the hospital several months ago, she developed volume overload and was treated with IV diuretic therapy and has been on Torsemide since. She was recently seen by Dr. Lovena Le for evaluation of syncope and on the way the office, she fell to the ground while walking through the parking lot. At that OV interrogation of her device showed normal device function. It was felt that her symptoms were due to orthostasis along with carvedilol. She received a 500cc IV saline bolus in the office. Her diuretic was stopped and her carvedilol was stopped. When I last saw her back she was doing much better. Her falling had completely resolved. Her weight was stable around 195-199lbs. She still had some mild dizziness when she went from sitting to standing when I saw her last and we  started Midodrine along with TED hose stockings.  She recently saw Dr. Ron Parker after gaining 10 pounds to 210lbs.  She was also having some exertional SOB and was felt to be volume overloaded. She was started back on Lasix and told to take 40mg  until weight reached 205lbs and then as needed to keep weight around 205lbs.  She presents back today for followup.  She has been taking the lasix daily to maintain a weight around 200-205lbs and is feeling much better.  She really has no SOB unless her back is hurting.  She has no LE edema or abdominal bloating.  She has not really had any orthostatic dizziness.     Wt Readings from Last 3 Encounters:  12/26/13 212 lb 6.4 oz (96.344 kg)  12/12/13 202 lb (91.627 kg)  12/06/13 197 lb (89.359 kg)     Past Medical History  Diagnosis Date  . Mitral valve disorder     a. Severe MR s/p repair 2003 (28 mm annuloplasty ring and and oversew of LAA). No CAD by cath at that time.  . Pacemaker     a. 2003: post-op afib after MR repair then symptomatic bradycardia requiring pacemaker. b. Upgrade to Medtronic Bi-V Pacemaker 2007.  [name] Cirrhosis     a. Newly recognized 09/2013 - by CT.  . CKD (chronic kidney disease)   . Pulmonary hypertension, mild     secondary to mild/mod LV dysfunction(EF 40%)  . Diastolic dysfunction   . Spinal stenosis  shes recieved epidural steroid injections in the past  . Chronic combined systolic and diastolic CHF (congestive heart failure)     EF 35-40% on echo 09/2013  . Chronic atrial fibrillation     a. Previously failed DCCV/amio/tikosyn  . NSVT (nonsustained ventricular tachycardia)     a. Isolated event during 09/2007 adm.  . Brady-tachy syndrome     s/p permanent pacemaker placement  . Hypertension     Current Outpatient Prescriptions  Medication Sig Dispense Refill  . acetaminophen (TYLENOL) 500 MG tablet Take 500 mg by mouth every 6 (six) hours as needed for mild pain.      . beta carotene w/minerals (OCUVITE) tablet  Take 1 tablet by mouth every evening.       . cetirizine (ZYRTEC) 10 MG tablet Take 10 mg by mouth daily.      . colchicine 0.6 MG tablet Take 0.6 mg by mouth daily as needed (Gout flareups).       . digoxin (LANOXIN) 0.125 MG tablet Take 0.0625 mg by mouth daily.      . ferrous sulfate 325 (65 FE) MG tablet Take 325 mg by mouth daily with supper.       . furosemide (LASIX) 40 MG tablet 1 tablet daily until weight is down to 205lbs and then as needed to keep weight stable at 205lbs.  30 tablet    . meclizine (ANTIVERT) 12.5 MG tablet Take 12.5 mg by mouth 2 (two) times daily as needed for dizziness.      . midodrine (PROAMATINE) 2.5 MG tablet Take 1 tablet (2.5 mg total) by mouth 3 (three) times daily with meals.  90 tablet  11  . Multiple Vitamin (MULTIVITAMIN WITH MINERALS) TABS tablet Take 1 tablet by mouth daily.      . Oxymetazoline HCl (NOSTRILLA CONGESTION RELIEF NA) Place 1 spray into the nose daily as needed (for congestion).      . potassium chloride SA (K-DUR,KLOR-CON) 20 MEQ tablet 1 tablet on the days that you take lasix,  90 tablet  3  . warfarin (COUMADIN) 5 MG tablet Take 2.5-5 mg by mouth daily. Take 2.5mg  daily, except take 5mg  on Mondays.      [name] zolpidem (AMBIEN) 5 MG tablet Take 5 mg by mouth at bedtime as needed for sleep.       No current facility-administered medications for this visit.    Allergies:   No Known Allergies  Social History:  The patient  reports that she quit smoking about 42 years ago. She does not have any smokeless tobacco history on file. She reports that she drinks alcohol. She reports that she does not use illicit drugs.   Family History:  [name] family history includes Other in her mother; Pancreatic cancer in her father.   ROS:  Please see the history of present illness.      All other systems reviewed and negative.   PHYSICAL EXAM: VS:  LMP 10/10/2013 Well nourished, well developed, in no acute distress HEENT: normal Neck: no  JVD Cardiac:  normal S1, S2; RRR; no murmur Lungs:  clear to auscultation bilaterally, no wheezing, rhonchi or rales Abd: soft, nontender, no hepatomegaly Ext: no edema Skin: warm and dry Neuro:  CNs 2-12 intact, no focal abnormalities noted   ASSESSMENT AND PLAN:  1.  Chronic combined systolic/diastolic CHF - now back on diuretics due to recent volume overload and appears well compensated.  She does not tolerate beta blockers due to severe orthostasis.  She  is following a low sodium diet. Her goal dry weight is 203-205lbs which she has been maintaining on Lasix daily.  She would like to try to lose more weight and is concerned about how to guide diuretic therapy if she loses more weight through diet and exercise.  I instructed her to let me know if she develops recurrent problems with orthostasis and we would have to back off of her diuretic and reassess what her dry weight should be if she continues to lose weight with diet and exercise.  - check BMET today since she is back on diuretics 2.  MV disease s/p MV repair 3.  HTN - controlled off BP meds 4.  Chronic atrial fibrillation rate controlled  - continue Digoxin/warfarin 5. Tachybrady syndrome s/p PPM with upgrade to BiVPPM  6. Orthostatic hypotension with falls -  Will continue midodrine 2.5mg  TID along with TED hose stockings  7.  CKD  Followup with me in 3 months    Signed, [name], MD 01/05/2014 11:21 AM

## 2014-01-05 NOTE — Patient Instructions (Signed)
Your physician recommends that you continue on your current medications as directed. Please refer to the Current Medication list given to you today.  Your physician recommends that you go to the lab today for a BMET  Your physician recommends that you schedule a follow-up appointment in: 3 Months with Dr Radford Pax

## 2014-01-11 ENCOUNTER — Ambulatory Visit (INDEPENDENT_AMBULATORY_CARE_PROVIDER_SITE_OTHER): Payer: Medicare Other | Admitting: Pharmacist

## 2014-01-11 DIAGNOSIS — Z5181 Encounter for therapeutic drug level monitoring: Secondary | ICD-10-CM

## 2014-01-11 LAB — POCT INR: INR: 2.2

## 2014-01-12 ENCOUNTER — Encounter: Payer: Medicare Other | Admitting: Internal Medicine

## 2014-01-25 ENCOUNTER — Ambulatory Visit (INDEPENDENT_AMBULATORY_CARE_PROVIDER_SITE_OTHER): Payer: Medicare Other | Admitting: *Deleted

## 2014-01-25 DIAGNOSIS — Z5181 Encounter for therapeutic drug level monitoring: Secondary | ICD-10-CM

## 2014-01-25 DIAGNOSIS — I4891 Unspecified atrial fibrillation: Secondary | ICD-10-CM

## 2014-01-25 LAB — POCT INR: INR: 1.4

## 2014-01-31 ENCOUNTER — Ambulatory Visit: Payer: Self-pay | Admitting: Cardiology

## 2014-02-02 ENCOUNTER — Ambulatory Visit (INDEPENDENT_AMBULATORY_CARE_PROVIDER_SITE_OTHER): Payer: Medicare Other | Admitting: *Deleted

## 2014-02-02 DIAGNOSIS — Z5181 Encounter for therapeutic drug level monitoring: Secondary | ICD-10-CM

## 2014-02-02 DIAGNOSIS — I4891 Unspecified atrial fibrillation: Secondary | ICD-10-CM

## 2014-02-02 LAB — POCT INR: INR: 1.5

## 2014-02-09 ENCOUNTER — Ambulatory Visit (INDEPENDENT_AMBULATORY_CARE_PROVIDER_SITE_OTHER): Payer: Medicare Other | Admitting: Pharmacist Clinician (PhC)/ Clinical Pharmacy Specialist

## 2014-02-09 DIAGNOSIS — I4891 Unspecified atrial fibrillation: Secondary | ICD-10-CM

## 2014-02-09 DIAGNOSIS — Z5181 Encounter for therapeutic drug level monitoring: Secondary | ICD-10-CM

## 2014-02-09 LAB — POCT INR: INR: 1.9

## 2014-02-14 ENCOUNTER — Encounter: Payer: Self-pay | Admitting: Internal Medicine

## 2014-02-14 ENCOUNTER — Ambulatory Visit (INDEPENDENT_AMBULATORY_CARE_PROVIDER_SITE_OTHER): Payer: Medicare Other | Admitting: Internal Medicine

## 2014-02-14 VITALS — BP 132/82 | HR 82 | Ht 62.0 in | Wt 205.0 lb

## 2014-02-14 DIAGNOSIS — I509 Heart failure, unspecified: Secondary | ICD-10-CM

## 2014-02-14 DIAGNOSIS — I951 Orthostatic hypotension: Secondary | ICD-10-CM

## 2014-02-14 DIAGNOSIS — I5042 Chronic combined systolic (congestive) and diastolic (congestive) heart failure: Secondary | ICD-10-CM

## 2014-02-14 DIAGNOSIS — I482 Chronic atrial fibrillation, unspecified: Secondary | ICD-10-CM

## 2014-02-14 DIAGNOSIS — Z950 Presence of cardiac pacemaker: Secondary | ICD-10-CM

## 2014-02-14 DIAGNOSIS — I4891 Unspecified atrial fibrillation: Secondary | ICD-10-CM

## 2014-02-14 LAB — MDC_IDC_ENUM_SESS_TYPE_INCLINIC
Battery Remaining Longevity: 26 mo
Date Time Interrogation Session: 20150331113006
Lead Channel Impedance Value: 596 Ohm
Lead Channel Pacing Threshold Amplitude: 1 V
Lead Channel Pacing Threshold Amplitude: 1 V
Lead Channel Pacing Threshold Pulse Width: 0.4 ms
Lead Channel Sensing Intrinsic Amplitude: 11.2 mV
Lead Channel Setting Pacing Amplitude: 2 V
Lead Channel Setting Pacing Amplitude: 2 V
Lead Channel Setting Pacing Pulse Width: 0.4 ms
Lead Channel Setting Pacing Pulse Width: 0.4 ms
Lead Channel Setting Sensing Sensitivity: 2.8 mV
MDC IDC MSMT BATTERY VOLTAGE: 2.92 V
MDC IDC MSMT LEADCHNL LV IMPEDANCE VALUE: 781 Ohm
MDC IDC MSMT LEADCHNL LV PACING THRESHOLD PULSEWIDTH: 0.4 ms
MDC IDC MSMT LEADCHNL RA IMPEDANCE VALUE: 0 Ohm
MDC IDC STAT BRADY RV PERCENT PACED: 99 %

## 2014-02-14 NOTE — Assessment & Plan Note (Signed)
Her medtronic BiV ppm is working normally. Will recheck in several months.

## 2014-02-14 NOTE — Assessment & Plan Note (Signed)
Her symptoms have resolved. No change in medical therapy.

## 2014-02-14 NOTE — Progress Notes (Deleted)
HPI [name] is referred today for evaluation of syncope. She is a pleasant 80 yo woman with a h/o chronic well compensated CHF, s/p BiV PPM insertion who was stable until November when she developed nausea, vomiting, diarrhea and was found to have a peri-rectal abscess for which she underwent I&D. Because of complications and subsequent debilitation, she stayed a month in rehab. Since then she will experience periods where she suddenly gets dizzy and passes out. She has very little warning. After passing out she awakens within a few minutes. She has gone to the ER and been admitted. Orthostatic vitals have demonstrated hypotension with standing. She has not had any spells while sitting or lying down. She has not bitten her tongue or lost control of bowel or bladder continence. While she was in the hospital several months ago, she developed volume overload and was treated with IV diuretic therapy and has been on Torsemide since. On the way into our office today, she fell to the ground while walking through the parking lot.  No Known Allergies   Current Outpatient Prescriptions  Medication Sig Dispense Refill  . acetaminophen (TYLENOL) 500 MG tablet Take 500 mg by mouth every 6 (six) hours as needed for mild pain.      . beta carotene w/minerals (OCUVITE) tablet Take 1 tablet by mouth every evening.       . cetirizine (ZYRTEC) 10 MG tablet Take 10 mg by mouth daily.      . colchicine 0.6 MG tablet Take 0.6 mg by mouth daily as needed (Gout flareups).       . digoxin (LANOXIN) 0.125 MG tablet Take 0.0625 mg by mouth daily.      . ferrous sulfate 325 (65 FE) MG tablet Take 325 mg by mouth daily with supper.       . furosemide (LASIX) 40 MG tablet 1 tablet daily until weight is down to 205lbs and then as needed to keep weight stable at 205lbs.  30 tablet    . midodrine (PROAMATINE) 2.5 MG tablet Take 1 tablet (2.5 mg total) by mouth 3 (three) times daily with meals.  90 tablet  11  . Multiple  Vitamin (MULTIVITAMIN WITH MINERALS) TABS tablet Take 1 tablet by mouth daily.      . Oxymetazoline HCl (NOSTRILLA CONGESTION RELIEF NA) Place 1 spray into the nose daily as needed (for congestion).      . potassium chloride SA (K-DUR,KLOR-CON) 20 MEQ tablet 1 tablet on the days that you take lasix,  90 tablet  3  . warfarin (COUMADIN) 5 MG tablet Take 2.5-5 mg by mouth daily. Take 2.5mg  daily, except take 5mg  on Mondays.      [name] zolpidem (AMBIEN) 5 MG tablet Take 5 mg by mouth at bedtime as needed for sleep.       No current facility-administered medications for this visit.     Past Medical History  Diagnosis Date  . Mitral valve disorder     a. Severe MR s/p repair 2003 (28 mm annuloplasty ring and and oversew of LAA). No CAD by cath at that time.  . Pacemaker     a. 2003: post-op afib after MR repair then symptomatic bradycardia requiring pacemaker. b. Upgrade to Medtronic Bi-V Pacemaker 2007.  [name] Cirrhosis     a. Newly recognized 09/2013 - by CT.  . CKD (chronic kidney disease)   . Pulmonary hypertension, mild     secondary to mild/mod LV dysfunction(EF 40%)  .  Diastolic dysfunction   . Spinal stenosis     shes recieved epidural steroid injections in the past  . Chronic combined systolic and diastolic CHF (congestive heart failure)     EF 35-40% on echo 09/2013  . Chronic atrial fibrillation     a. Previously failed DCCV/amio/tikosyn  . NSVT (nonsustained ventricular tachycardia)     a. Isolated event during 09/2007 adm.  . Brady-tachy syndrome     s/p permanent pacemaker placement  . Hypertension     ROS:   All systems reviewed and negative except as noted in the HPI.   Past Surgical History  Procedure Laterality Date  . Valve replacement      sever mitral regurgitation s/p mitral valve annuloplasty ring  . Cholecystectomy    . Appendectomy    . Irrigation and debridement abscess N/A 10/14/2013    Procedure: IRRIGATION AND DEBRIDEMENT PERINEAL ABSCESS;  Surgeon:  Rolm Bookbinder, MD;  Location: [facility] OR;  Service: General;  Laterality: N/A;     Family History  Problem Relation Age of Onset  . Pancreatic cancer Father   . Other Mother     Mother died at 38 with no real medical problems     History   Social History  . Marital Status: Single    Spouse Name: N/A    Number of Children: N/A  . Years of Education: N/A   Occupational History  . Not on file.   Social History Main Topics  . Smoking status: Former Smoker    Quit date: 10/11/1971  . Smokeless tobacco: Not on file  . Alcohol Use: 0.0 oz/week     Comment: rare  . Drug Use: No  . Sexual Activity: Not on file   Other Topics Concern  . Not on file   Social History Narrative  . No narrative on file     BP 132/82  Pulse 82  Ht 5\' 2"  (1.575 m)  Wt 205 lb (92.987 kg)  BMI 37.49 kg/m2  LMP 10/10/2013 with standing, blood pressure dropped below 80 mm Hg.   Physical Exam:  Chronically ill appearing NAD HEENT: Unremarkable Neck:  No JVD, no thyromegally Back:  No CVA tenderness Lungs:  Clear with no wheezes HEART:  Regular rate rhythm, no murmurs, no rubs, no clicks Abd:  soft, positive bowel sounds, no organomegally, no rebound, no guarding Ext:  2 plus pulses, no edema, no cyanosis, no clubbing Skin:  No rashes no nodules Neuro:  CN II through XII intact, motor grossly intact   DEVICE  Normal device function.  See PaceArt for details.   Assess/Plan:

## 2014-02-14 NOTE — Progress Notes (Signed)
HPI [name] returns today for followup. She is a pleasant 80 yo woman with a h/o CHB, chronic atrial fibrillation and orthostasis. She had a peri-rectal abscess and was hospitalized for several weeks and after had problems with orthostasis. This has all improved. She denies chest pain, sob, or syncope.  No Known Allergies   Current Outpatient Prescriptions  Medication Sig Dispense Refill  . acetaminophen (TYLENOL) 500 MG tablet Take 500 mg by mouth every 6 (six) hours as needed for mild pain.      . beta carotene w/minerals (OCUVITE) tablet Take 1 tablet by mouth every evening.       . cetirizine (ZYRTEC) 10 MG tablet Take 10 mg by mouth daily.      . colchicine 0.6 MG tablet Take 0.6 mg by mouth daily as needed (Gout flareups).       . digoxin (LANOXIN) 0.125 MG tablet Take 0.0625 mg by mouth daily.      . ferrous sulfate 325 (65 FE) MG tablet Take 325 mg by mouth daily with supper.       . furosemide (LASIX) 40 MG tablet 1 tablet daily until weight is down to 205lbs and then as needed to keep weight stable at 205lbs.  30 tablet    . midodrine (PROAMATINE) 2.5 MG tablet Take 1 tablet (2.5 mg total) by mouth 3 (three) times daily with meals.  90 tablet  11  . Multiple Vitamin (MULTIVITAMIN WITH MINERALS) TABS tablet Take 1 tablet by mouth daily.      . Oxymetazoline HCl (NOSTRILLA CONGESTION RELIEF NA) Place 1 spray into the nose daily as needed (for congestion).      . potassium chloride SA (K-DUR,KLOR-CON) 20 MEQ tablet 1 tablet on the days that you take lasix,  90 tablet  3  . warfarin (COUMADIN) 5 MG tablet Take 2.5-5 mg by mouth daily. Take 2.5mg  daily, except take 5mg  on Mondays.      [name] zolpidem (AMBIEN) 5 MG tablet Take 5 mg by mouth at bedtime as needed for sleep.       No current facility-administered medications for this visit.     Past Medical History  Diagnosis Date  . Mitral valve disorder     a. Severe MR s/p repair 2003 (28 mm annuloplasty ring and and oversew  of LAA). No CAD by cath at that time.  . Pacemaker     a. 2003: post-op afib after MR repair then symptomatic bradycardia requiring pacemaker. b. Upgrade to Medtronic Bi-V Pacemaker 2007.  [name] Cirrhosis     a. Newly recognized 09/2013 - by CT.  . CKD (chronic kidney disease)   . Pulmonary hypertension, mild     secondary to mild/mod LV dysfunction(EF 40%)  . Diastolic dysfunction   . Spinal stenosis     shes recieved epidural steroid injections in the past  . Chronic combined systolic and diastolic CHF (congestive heart failure)     EF 35-40% on echo 09/2013  . Chronic atrial fibrillation     a. Previously failed DCCV/amio/tikosyn  . NSVT (nonsustained ventricular tachycardia)     a. Isolated event during 09/2007 adm.  . Brady-tachy syndrome     s/p permanent pacemaker placement  . Hypertension     ROS:   All systems reviewed and negative except as noted in the HPI.   Past Surgical History  Procedure Laterality Date  . Valve replacement      sever mitral regurgitation s/p mitral valve annuloplasty  ring  . Cholecystectomy    . Appendectomy    . Irrigation and debridement abscess N/A 10/14/2013    Procedure: IRRIGATION AND DEBRIDEMENT PERINEAL ABSCESS;  Surgeon: Rolm Bookbinder, MD;  Location: [facility] OR;  Service: General;  Laterality: N/A;     Family History  Problem Relation Age of Onset  . Pancreatic cancer Father   . Other Mother     Mother died at 65 with no real medical problems     History   Social History  . Marital Status: Single    Spouse Name: N/A    Number of Children: N/A  . Years of Education: N/A   Occupational History  . Not on file.   Social History Main Topics  . Smoking status: Former Smoker    Quit date: 10/11/1971  . Smokeless tobacco: Not on file  . Alcohol Use: 0.0 oz/week     Comment: rare  . Drug Use: No  . Sexual Activity: Not on file   Other Topics Concern  . Not on file   Social History Narrative  . No narrative on file      BP 132/82  Pulse 82  Ht 5\' 2"  (1.575 m)  Wt 205 lb (92.987 kg)  BMI 37.49 kg/m2  LMP 10/10/2013  Physical Exam:  Well appearing elderly woman, NAD HEENT: Unremarkable Neck:  No JVD, no thyromegally Back:  No CVA tenderness Lungs:  Clear with no wheezes HEART:  Regular rate rhythm, no murmurs, no rubs, no clicks Abd:  soft, positive bowel sounds, no organomegally, no rebound, no guarding Ext:  2 plus pulses, no edema, no cyanosis, no clubbing Skin:  No rashes no nodules Neuro:  CN II through XII intact, motor grossly intact   DEVICE  Normal device function.  See PaceArt for details.   Assess/Plan:

## 2014-02-14 NOTE — Assessment & Plan Note (Signed)
Her ventricular rates are well controlled. Will continue her current meds.

## 2014-02-14 NOTE — Patient Instructions (Signed)
Your physician wants you to follow-up in: 6 months in the device clinic and 12 months with Dr Knox Saliva will receive a reminder letter in the mail two months in advance. If you don't receive a letter, please call our office to schedule the follow-up appointment.

## 2014-02-20 ENCOUNTER — Encounter: Payer: Self-pay | Admitting: Internal Medicine

## 2014-02-23 ENCOUNTER — Ambulatory Visit (INDEPENDENT_AMBULATORY_CARE_PROVIDER_SITE_OTHER): Payer: Medicare Other

## 2014-02-23 DIAGNOSIS — I4891 Unspecified atrial fibrillation: Secondary | ICD-10-CM

## 2014-02-23 DIAGNOSIS — Z5181 Encounter for therapeutic drug level monitoring: Secondary | ICD-10-CM

## 2014-02-23 LAB — POCT INR: INR: 1.5

## 2014-02-28 ENCOUNTER — Ambulatory Visit
Admission: RE | Admit: 2014-02-28 | Discharge: 2014-02-28 | Disposition: A | Discharge status: Home / Self Care | Payer: Medicare Other | Source: Ambulatory Visit | Attending: Internal Medicine | Admitting: Internal Medicine

## 2014-02-28 ENCOUNTER — Other Ambulatory Visit: Payer: Self-pay | Admitting: Internal Medicine

## 2014-02-28 DIAGNOSIS — J069 Acute upper respiratory infection, unspecified: Secondary | ICD-10-CM

## 2014-03-06 ENCOUNTER — Ambulatory Visit (INDEPENDENT_AMBULATORY_CARE_PROVIDER_SITE_OTHER): Payer: Medicare Other | Admitting: Pharmacist

## 2014-03-06 DIAGNOSIS — I4891 Unspecified atrial fibrillation: Secondary | ICD-10-CM

## 2014-03-06 DIAGNOSIS — Z5181 Encounter for therapeutic drug level monitoring: Secondary | ICD-10-CM

## 2014-03-06 LAB — POCT INR: INR: 2.6

## 2014-03-20 ENCOUNTER — Ambulatory Visit (INDEPENDENT_AMBULATORY_CARE_PROVIDER_SITE_OTHER): Payer: Medicare Other | Admitting: Pharmacist

## 2014-03-20 DIAGNOSIS — Z5181 Encounter for therapeutic drug level monitoring: Secondary | ICD-10-CM

## 2014-03-20 DIAGNOSIS — I4891 Unspecified atrial fibrillation: Secondary | ICD-10-CM

## 2014-03-20 LAB — POCT INR: INR: 3

## 2014-03-29 ENCOUNTER — Ambulatory Visit (INDEPENDENT_AMBULATORY_CARE_PROVIDER_SITE_OTHER): Payer: Medicare Other | Admitting: Pharmacist

## 2014-03-29 DIAGNOSIS — I4891 Unspecified atrial fibrillation: Secondary | ICD-10-CM

## 2014-03-29 DIAGNOSIS — Z5181 Encounter for therapeutic drug level monitoring: Secondary | ICD-10-CM

## 2014-03-29 LAB — POCT INR: INR: 2.4

## 2014-04-04 ENCOUNTER — Ambulatory Visit: Payer: Medicare Other | Admitting: Cardiology

## 2014-04-12 ENCOUNTER — Other Ambulatory Visit: Payer: Self-pay | Admitting: Nurse Practitioner

## 2014-04-20 ENCOUNTER — Ambulatory Visit: Payer: Medicare Other | Admitting: Cardiology

## 2014-04-28 ENCOUNTER — Ambulatory Visit: Payer: Medicare Other | Admitting: Cardiology

## 2014-05-03 ENCOUNTER — Ambulatory Visit (INDEPENDENT_AMBULATORY_CARE_PROVIDER_SITE_OTHER): Payer: Medicare Other | Admitting: Pharmacist

## 2014-05-03 DIAGNOSIS — Z5181 Encounter for therapeutic drug level monitoring: Secondary | ICD-10-CM

## 2014-05-03 DIAGNOSIS — I4891 Unspecified atrial fibrillation: Secondary | ICD-10-CM

## 2014-05-03 LAB — POCT INR: INR: 2.9

## 2014-05-14 ENCOUNTER — Other Ambulatory Visit: Payer: Self-pay | Admitting: Internal Medicine

## 2014-05-19 ENCOUNTER — Other Ambulatory Visit: Payer: Self-pay | Admitting: Internal Medicine

## 2014-05-22 ENCOUNTER — Other Ambulatory Visit: Payer: Self-pay | Admitting: *Deleted

## 2014-05-22 MED ORDER — POTASSIUM CHLORIDE CRYS ER 20 MEQ PO TBCR: EXTENDED_RELEASE_TABLET | ORAL | Status: DC

## 2014-05-31 ENCOUNTER — Other Ambulatory Visit: Payer: Self-pay | Admitting: Cardiology

## 2014-05-31 MED ORDER — WARFARIN SODIUM 5 MG PO TABS: ORAL_TABLET | ORAL | Status: DC

## 2014-06-14 ENCOUNTER — Ambulatory Visit (INDEPENDENT_AMBULATORY_CARE_PROVIDER_SITE_OTHER): Payer: Medicare Other | Admitting: *Deleted

## 2014-06-14 DIAGNOSIS — Z5181 Encounter for therapeutic drug level monitoring: Secondary | ICD-10-CM

## 2014-06-14 DIAGNOSIS — I4891 Unspecified atrial fibrillation: Secondary | ICD-10-CM

## 2014-06-14 LAB — POCT INR: INR: 2.9

## 2014-07-10 ENCOUNTER — Other Ambulatory Visit: Payer: Self-pay | Admitting: Cardiology

## 2014-07-19 ENCOUNTER — Ambulatory Visit: Payer: Medicare Other | Admitting: Cardiology

## 2014-07-26 ENCOUNTER — Ambulatory Visit (INDEPENDENT_AMBULATORY_CARE_PROVIDER_SITE_OTHER): Payer: Medicare Other | Admitting: *Deleted

## 2014-07-26 DIAGNOSIS — Z5181 Encounter for therapeutic drug level monitoring: Secondary | ICD-10-CM

## 2014-07-26 DIAGNOSIS — I4891 Unspecified atrial fibrillation: Secondary | ICD-10-CM

## 2014-07-26 LAB — POCT INR: INR: 3.2

## 2014-08-07 ENCOUNTER — Other Ambulatory Visit: Payer: Self-pay | Admitting: Cardiology

## 2014-08-16 ENCOUNTER — Ambulatory Visit (INDEPENDENT_AMBULATORY_CARE_PROVIDER_SITE_OTHER): Payer: Medicare Other | Admitting: Pharmacist

## 2014-08-16 ENCOUNTER — Ambulatory Visit (INDEPENDENT_AMBULATORY_CARE_PROVIDER_SITE_OTHER): Payer: Medicare Other | Admitting: *Deleted

## 2014-08-16 DIAGNOSIS — Z5181 Encounter for therapeutic drug level monitoring: Secondary | ICD-10-CM

## 2014-08-16 DIAGNOSIS — I482 Chronic atrial fibrillation, unspecified: Secondary | ICD-10-CM

## 2014-08-16 DIAGNOSIS — I4891 Unspecified atrial fibrillation: Secondary | ICD-10-CM

## 2014-08-16 LAB — POCT INR: INR: 2.5

## 2014-08-16 LAB — MDC_IDC_ENUM_SESS_TYPE_INCLINIC
Battery Voltage: 2.9 V
Brady Statistic RV Percent Paced: 98 %
Lead Channel Impedance Value: 0 Ohm
Lead Channel Pacing Threshold Amplitude: 0.5 V
Lead Channel Pacing Threshold Pulse Width: 0.4 ms
Lead Channel Setting Pacing Amplitude: 2 V
Lead Channel Setting Pacing Amplitude: 2 V
Lead Channel Setting Pacing Pulse Width: 0.4 ms
MDC IDC MSMT BATTERY REMAINING LONGEVITY: 18 mo
MDC IDC MSMT LEADCHNL LV IMPEDANCE VALUE: 696 Ohm
MDC IDC MSMT LEADCHNL LV PACING THRESHOLD AMPLITUDE: 1 V
MDC IDC MSMT LEADCHNL LV PACING THRESHOLD PULSEWIDTH: 0.4 ms
MDC IDC MSMT LEADCHNL RV IMPEDANCE VALUE: 604 Ohm
MDC IDC SESS DTM: 20150930092949
MDC IDC SET LEADCHNL LV PACING PULSEWIDTH: 0.4 ms
MDC IDC SET LEADCHNL RV SENSING SENSITIVITY: 2.8 mV

## 2014-08-16 NOTE — Progress Notes (Signed)
CRT-P device check in clinic. Normal device function. Thresholds, sensing, impedance consistent with previous measurements. Histograms appropriate for patient and level of activity. 38 ventricular high rate episodes the longest 5 seconds, max V 192bpm. Patient bi-ventricularly pacing >97.7% of the time. Device programmed with appropriate safety margins.  Estimated longevity 1.5 years. ROV 6 months with Dr. Lovena Le.

## 2014-09-12 ENCOUNTER — Other Ambulatory Visit: Payer: Self-pay

## 2014-09-12 MED ORDER — DIGOXIN 125 MCG PO TABS: 0.0625 mg | ORAL_TABLET | Freq: Every day | ORAL | Status: DC

## 2014-09-20 ENCOUNTER — Encounter: Payer: Self-pay | Admitting: Cardiology

## 2014-09-20 ENCOUNTER — Ambulatory Visit (INDEPENDENT_AMBULATORY_CARE_PROVIDER_SITE_OTHER): Payer: Medicare Other | Admitting: *Deleted

## 2014-09-20 ENCOUNTER — Ambulatory Visit (INDEPENDENT_AMBULATORY_CARE_PROVIDER_SITE_OTHER): Payer: Medicare Other | Admitting: Cardiology

## 2014-09-20 VITALS — BP 136/70 | HR 64 | Ht 62.0 in | Wt 218.4 lb

## 2014-09-20 DIAGNOSIS — N183 Chronic kidney disease, stage 3 unspecified: Secondary | ICD-10-CM

## 2014-09-20 DIAGNOSIS — I4891 Unspecified atrial fibrillation: Secondary | ICD-10-CM

## 2014-09-20 DIAGNOSIS — I059 Rheumatic mitral valve disease, unspecified: Secondary | ICD-10-CM

## 2014-09-20 DIAGNOSIS — I951 Orthostatic hypotension: Secondary | ICD-10-CM

## 2014-09-20 DIAGNOSIS — I482 Chronic atrial fibrillation, unspecified: Secondary | ICD-10-CM

## 2014-09-20 DIAGNOSIS — I10 Essential (primary) hypertension: Secondary | ICD-10-CM

## 2014-09-20 DIAGNOSIS — I5042 Chronic combined systolic (congestive) and diastolic (congestive) heart failure: Secondary | ICD-10-CM

## 2014-09-20 DIAGNOSIS — Z9889 Other specified postprocedural states: Secondary | ICD-10-CM

## 2014-09-20 DIAGNOSIS — Z23 Encounter for immunization: Secondary | ICD-10-CM

## 2014-09-20 DIAGNOSIS — Z5181 Encounter for therapeutic drug level monitoring: Secondary | ICD-10-CM

## 2014-09-20 LAB — BASIC METABOLIC PANEL
BUN: 23 mg/dL (ref 6–23)
CALCIUM: 9.1 mg/dL (ref 8.4–10.5)
CHLORIDE: 111 meq/L (ref 96–112)
CO2: 24 mEq/L (ref 19–32)
Creatinine, Ser: 1.4 mg/dL — ABNORMAL HIGH (ref 0.4–1.2)
GFR: 36.98 mL/min — ABNORMAL LOW (ref >60.00)
Glucose, Bld: 98 mg/dL (ref 70–99)
Potassium: 4 mEq/L (ref 3.5–5.1)
Sodium: 143 mEq/L (ref 135–145)

## 2014-09-20 LAB — POCT INR: INR: 2.7

## 2014-09-20 NOTE — Patient Instructions (Signed)
Your physician wants you to follow-up in: 6 months with Dr. Radford Pax. You will receive a reminder letter in the mail two months in advance. If you don't receive a letter, please call our office to schedule the follow-up appointment.  Your physician recommends that you have lab work TODAY (Dig, BMET)

## 2014-09-20 NOTE — Progress Notes (Signed)
[name], [name], [name]  [id number] Phone: [phone] Fax:  [phone]  Date:  09/20/2014   ID:  [name], DOB Jul 23, 1926, MRN [id number]  PCP:  Sherry Hams, MD  Cardiologist:  [name], MD    History of Present Illness: [name] is a pleasant 80 yo woman with a h/o chronic well compensated combined systolic/diastolic CHF, tachy brady syndrome s/p BiV PPM insertion, severe MR s/p MV repair, CKD, chronic atrial fibrillation, HTN and moderate pulmonary HTN who presents today for followup.  She is doing well. She denies any chest pain, SOB, DOE, dizziness, palpitations or syncope.  She will occasionally has some LE edema depending on if she has been on her feet a lot.  Her weight is up today but she says that her weight is 3lbs up due to her clothing and she gained weight on a trip this summer.  She ate out a lot during that time.  Her weight at home as been between 210-215lbs.  She does not think she is retaining any extra fluid.       Wt Readings from Last 3 Encounters:  09/20/14 218 lb 6.4 oz (99.066 kg)  02/14/14 205 lb (92.987 kg)  01/05/14 206 lb 12.8 oz (93.804 kg)     Past Medical History  Diagnosis Date  . Mitral valve disorder     a. Severe MR s/p repair 2003 (28 mm annuloplasty ring and and oversew of LAA). No CAD by cath at that time.  . Pacemaker     a. 2003: post-op afib after MR repair then symptomatic bradycardia requiring pacemaker. b. Upgrade to Medtronic Bi-V Pacemaker 2007.  [name] Cirrhosis     a. Newly recognized 09/2013 - by CT.  . CKD (chronic kidney disease)   . Pulmonary hypertension, mild     secondary to mild/mod LV dysfunction(EF 40%)  . Diastolic dysfunction   . Spinal stenosis     shes recieved epidural steroid injections in the past  . Chronic combined systolic and diastolic CHF (congestive heart failure)     EF 35-40% on echo 09/2013  . Chronic atrial fibrillation     a. Previously failed DCCV/amio/tikosyn  .  NSVT (nonsustained ventricular tachycardia)     a. Isolated event during 09/2007 adm.  . Brady-tachy syndrome     s/p permanent pacemaker placement  . Hypertension     Current Outpatient Prescriptions  Medication Sig Dispense Refill  . acetaminophen (TYLENOL) 500 MG tablet Take 500 mg by mouth every 6 (six) hours as needed for mild pain.    . beta carotene w/minerals (OCUVITE) tablet Take 1 tablet by mouth every evening.     . cetirizine (ZYRTEC) 10 MG tablet Take 10 mg by mouth as needed.     . colchicine 0.6 MG tablet Take 0.6 mg by mouth daily.     . digoxin (LANOXIN) 0.125 MG tablet Take 0.5 tablets (0.0625 mg total) by mouth daily. 30 tablet 3  . ferrous sulfate 325 (65 FE) MG tablet Take 325 mg by mouth daily with supper.     . furosemide (LASIX) 40 MG tablet 1 tablet daily until weight is down to 215lbs and then as needed to keep weight stable at 215lbs. 30 tablet   . KLOR-CON M20 20 MEQ tablet TAKE 1 TABLET EVERY DAY 30 tablet 1  . midodrine (PROAMATINE) 2.5 MG tablet Take 1 tablet (2.5 mg total) by mouth 3 (three) times daily with meals. 90 tablet  11  . Multiple Vitamin (MULTIVITAMIN WITH MINERALS) TABS tablet Take 1 tablet by mouth daily.    . Oxymetazoline HCl (NOSTRILLA CONGESTION RELIEF NA) Place 1 spray into the nose daily as needed (for congestion).    . warfarin (COUMADIN) 5 MG tablet Take as directed by anticoagulation clinic 90 tablet 1  . zolpidem (AMBIEN) 5 MG tablet Take 5 mg by mouth at bedtime as needed for sleep.     No current facility-administered medications for this visit.    Allergies:   No Known Allergies  Social History:  The patient  reports that she quit smoking about 42 years ago. She does not have any smokeless tobacco history on file. She reports that she drinks alcohol. She reports that she does not use illicit drugs.   Family History:  [name] family history includes Other in her mother; Pancreatic cancer in her father.   ROS:  Please see  the history of present illness.      All other systems reviewed and negative.   PHYSICAL EXAM: VS:  BP 136/70 mmHg  Pulse 64  Ht 5\' 2"  (1.575 m)  Wt 218 lb 6.4 oz (99.066 kg)  BMI 39.94 kg/m2  LMP 10/10/2013 Well nourished, well developed, in no acute distress HEENT: normal Neck: no JVD Cardiac:  normal S1, S2; RRR; no murmur Lungs:  clear to auscultation bilaterally, no wheezing, rhonchi or rales Abd: soft, nontender, no hepatomegaly Ext: no edema Skin: warm and dry Neuro:  CNs 2-12 intact, no focal abnormalities noted     ASSESSMENT AND PLAN:  1. Chronic combined systolic/diastolic CHF - She does not tolerate beta blockers due to severe orthostasis. She is following a low sodium diet. Her goal dry weight in the past had been 203-205lbs which she had been maintaining on Lasix daily.  She went on a trip this summer and gained weight with eating out.  Her weight without clothes now is 215lb which she says is not from fluid.  She does not feel or appear volume overloaded.  I will set her dry weight at 215lbs  I instructed her to let me know if she develops recurrent problems with orthostasis and we would have to back off of her diuretic and reassess what her dry weight should be if she continues to lose weight with diet and exercise. - check BMET today  2. MV disease s/p MV repair 3. HTN - controlled off BP meds 4. Chronic atrial fibrillation rate controlled - continue Digoxin/warfarin             - check dig level 5. Tachybrady syndrome s/p PPM with upgrade to BiVPPM  6. Orthostatic hypotension with falls - Will continue midodrine 2.5mg  TID along with TED hose stockings  7. CKD stage 3  Followup with me in 6 months   Signed, [name], MD [facility] 09/20/2014 10:16 AM

## 2014-09-21 LAB — DIGOXIN LEVEL: DIGOXIN LVL: 0.3 ng/mL — AB (ref 0.8–2.0)

## 2014-10-03 ENCOUNTER — Encounter: Payer: Self-pay | Admitting: Internal Medicine

## 2014-10-06 ENCOUNTER — Other Ambulatory Visit: Payer: Self-pay | Admitting: Cardiology

## 2014-10-14 ENCOUNTER — Other Ambulatory Visit: Payer: Self-pay | Admitting: Cardiology

## 2014-10-24 ENCOUNTER — Encounter: Payer: Self-pay | Admitting: Internal Medicine

## 2014-11-01 ENCOUNTER — Ambulatory Visit (INDEPENDENT_AMBULATORY_CARE_PROVIDER_SITE_OTHER): Payer: Medicare Other | Admitting: *Deleted

## 2014-11-01 DIAGNOSIS — Z5181 Encounter for therapeutic drug level monitoring: Secondary | ICD-10-CM

## 2014-11-01 DIAGNOSIS — I4891 Unspecified atrial fibrillation: Secondary | ICD-10-CM

## 2014-11-01 LAB — POCT INR: INR: 3.7

## 2014-11-14 ENCOUNTER — Ambulatory Visit (INDEPENDENT_AMBULATORY_CARE_PROVIDER_SITE_OTHER): Payer: Medicare Other | Admitting: Internal Medicine

## 2014-11-14 ENCOUNTER — Encounter: Payer: Self-pay | Admitting: Internal Medicine

## 2014-11-14 ENCOUNTER — Encounter: Payer: Self-pay | Admitting: *Deleted

## 2014-11-14 ENCOUNTER — Ambulatory Visit (INDEPENDENT_AMBULATORY_CARE_PROVIDER_SITE_OTHER): Payer: Medicare Other | Admitting: *Deleted

## 2014-11-14 VITALS — BP 144/72 | HR 85 | Ht 62.0 in | Wt 216.8 lb

## 2014-11-14 DIAGNOSIS — I482 Chronic atrial fibrillation, unspecified: Secondary | ICD-10-CM

## 2014-11-14 DIAGNOSIS — I10 Essential (primary) hypertension: Secondary | ICD-10-CM

## 2014-11-14 DIAGNOSIS — I4891 Unspecified atrial fibrillation: Secondary | ICD-10-CM

## 2014-11-14 DIAGNOSIS — Z950 Presence of cardiac pacemaker: Secondary | ICD-10-CM

## 2014-11-14 DIAGNOSIS — I5042 Chronic combined systolic (congestive) and diastolic (congestive) heart failure: Secondary | ICD-10-CM

## 2014-11-14 DIAGNOSIS — Z5181 Encounter for therapeutic drug level monitoring: Secondary | ICD-10-CM

## 2014-11-14 LAB — MDC_IDC_ENUM_SESS_TYPE_INCLINIC
Battery Remaining Longevity: 16 mo
Battery Voltage: 2.88 V
Date Time Interrogation Session: 20151229130705
Implantable Pulse Generator Model: 8042
Lead Channel Impedance Value: 610 Ohm
Lead Channel Pacing Threshold Amplitude: 1 V
Lead Channel Pacing Threshold Pulse Width: 0.4 ms
Lead Channel Setting Pacing Amplitude: 2 V
Lead Channel Setting Pacing Amplitude: 2 V
Lead Channel Setting Pacing Pulse Width: 0.4 ms
MDC IDC MSMT LEADCHNL LV IMPEDANCE VALUE: 673 Ohm
MDC IDC MSMT LEADCHNL LV PACING THRESHOLD AMPLITUDE: 1 V
MDC IDC MSMT LEADCHNL LV PACING THRESHOLD PULSEWIDTH: 0.4 ms
MDC IDC MSMT LEADCHNL RA IMPEDANCE VALUE: 0 Ohm
MDC IDC MSMT LEADCHNL RV SENSING INTR AMPL: 11.2 mV
MDC IDC SET LEADCHNL LV PACING PULSEWIDTH: 0.4 ms
MDC IDC SET LEADCHNL RV SENSING SENSITIVITY: 2.8 mV
MDC IDC STAT BRADY RV PERCENT PACED: 95 %

## 2014-11-14 LAB — POCT INR: INR: 4.6

## 2014-11-14 NOTE — Assessment & Plan Note (Signed)
She has a device which is on elective recall and could fail without warning. As she is pm dependent, will schedule generator change as she is at risk of the device failing without warning.

## 2014-11-14 NOTE — Patient Instructions (Addendum)
Your physician recommends that you schedule a follow-up appointment in: 7-10 days from 12/13/14 in device clinic for wound check  Your physician recommends that you return for lab work on 12/06/14 at [place] A pacemaker battery usually lasts 4 to 12 years. Once or twice per year, you will be asked to visit your health care provider to have a full evaluation of your pacemaker. When a battery needs to be replaced, the entire pacemaker is replaced so that you can benefit from new circuitry and any new features that have been added to pacemakers. Most often, this procedure is very simple because the leads are already in place.  There are many things that affect how long a pacemaker battery will last, including:   The age of the pacemaker.   The number of leads (1, 2, or 3).   The pacemaker work load. If the pacemaker is helping the heart more often, the battery will not last as long as it would if the pacemaker did not need to help the heart.   Power (voltage) settings. LET [facility] CARE PROVIDER KNOW ABOUT:   Any allergies you have.   All medicines you are taking, including vitamins, herbs, eye drops, creams, and over-the-counter medicines.   Previous problems you or members of your family have had with the use of anesthetics.   Any blood disorders you have.   Previous surgeries you have had, especially since your last pacemaker placement.   Medical conditions you have.   Possibility of pregnancy, if this applies.  Symptoms of chest pain, trouble breathing, palpitations, light-headedness, or feelings of an abnormal or irregular heartbeat. RISKS AND COMPLICATIONS  Generally, this is a safe procedure. However, as with any procedure, problems can occur and include:   Bleeding.   Bruising of the skin around where the incision was made.   Pain at the incision site.   Pulling apart of the skin at the incision site.   Infection.   Allergic  reaction to anesthetics or other medicines used during the procedure.  People with diabetes may have a temporary increase in their blood sugar after any surgical procedure.  BEFORE THE PROCEDURE   Wash all of the skin around the area of the chest where the pacemaker is located.   Ask your health care provider for help with any medicine adjustments before the pacemaker is replaced.   Do not eat or drink anything after midnight on the night before the procedure or as directed by your health care provider.  Ask your health care provider if you can take a sip of water with any approved medicines the morning of the procedure. PROCEDURE   After giving medicine to numb the skin (local anesthetic), your health care provider will make a cut to reopen the pocket holding the pacemaker.   The old pacemaker will be disconnected from its leads.   The leads will be tested.   If needed, the leads will be replaced. If the leads are functioning properly, the new pacemaker may be connected to the existing leads.  A heart monitor and the pacemaker programmer will be used to make sure that the new pacemaker is working properly.  The incision site will then be closed. A dressing will be placed over the pacemaker site. The dressing will be removed 24-48 hours afterward. AFTER THE PROCEDURE   You will be taken to a recovery area after the new pacemaker implant is completed. Your vital signs such as blood pressure,  heart rate, breathing, and oxygen levels will be monitored.  Your health care provider will tell you when you will need to next test your pacemaker or when to return to the office for follow-up for removal of stitches. Document Released: 02/11/2007 Document Revised: 03/20/2014 Document Reviewed: 05/18/2013 [facility] Patient Information 2015 [place], [place]. This information is not intended to replace advice given to you by your health care provider. Make sure you discuss any questions you have  with your health care provider.

## 2014-11-14 NOTE — Assessment & Plan Note (Signed)
Her ventricular rate is well controlled. No change in meds.

## 2014-11-14 NOTE — Assessment & Plan Note (Signed)
Her blood pressure remains fairly well controlled. No change in meds. She is encouraged to lose weight.

## 2014-11-14 NOTE — Assessment & Plan Note (Signed)
Her symptoms remain class 2. She will continue her current meds. 

## 2014-11-14 NOTE — Progress Notes (Signed)
HPI [name] returns today for followup. She is a pleasant 80 yo woman with a h/o CHB, chronic atrial fibrillation and orthostasis.  She denies chest pain, sob, or syncope. She is approximately one year from ERI. She has been found to have a PPM which is on active recall. No other complaints.  No Known Allergies   Current Outpatient Prescriptions  Medication Sig Dispense Refill  . acetaminophen (TYLENOL) 500 MG tablet Take 500 mg by mouth every 6 (six) hours as needed for mild pain.    . beta carotene w/minerals (OCUVITE) tablet Take 1 tablet by mouth every evening.     . cetirizine (ZYRTEC) 10 MG tablet Take 10 mg by mouth daily as needed for allergies.     [name] colchicine 0.6 MG tablet Take 0.6 mg by mouth daily.     . digoxin (LANOXIN) 0.125 MG tablet Take 0.5 tablets (0.0625 mg total) by mouth daily. 30 tablet 3  . ferrous sulfate 325 (65 FE) MG tablet Take 325 mg by mouth daily with supper.     . furosemide (LASIX) 20 MG tablet Take 2 tablets by mouth daily.  3  . KLOR-CON M20 20 MEQ tablet TAKE 1 TABLET EVERY DAY (Patient taking differently: TAKE 1 TABLET BY MOUTH EVERY DAY) 30 tablet 6  . midodrine (PROAMATINE) 2.5 MG tablet Take 1 tablet (2.5 mg total) by mouth 3 (three) times daily with meals. 90 tablet 11  . Multiple Vitamin (MULTIVITAMIN WITH MINERALS) TABS tablet Take 1 tablet by mouth daily.    . Oxymetazoline HCl (NOSTRILLA CONGESTION RELIEF NA) Place 1 spray into the nose daily as needed (for congestion).    . warfarin (COUMADIN) 5 MG tablet TAKE AS DIRECTED BY ANTICOAGULATION CLINIC 90 tablet 1   No current facility-administered medications for this visit.     Past Medical History  Diagnosis Date  . Mitral valve disorder     a. Severe MR s/p repair 2003 (28 mm annuloplasty ring and and oversew of LAA). No CAD by cath at that time.  . Pacemaker     a. 2003: post-op afib after MR repair then symptomatic bradycardia requiring pacemaker. b. Upgrade to Medtronic Bi-V  Pacemaker 2007.  [name] Cirrhosis     a. Newly recognized 09/2013 - by CT.  . CKD (chronic kidney disease)   . Pulmonary hypertension, mild     secondary to mild/mod LV dysfunction(EF 40%)  . Diastolic dysfunction   . Spinal stenosis     shes recieved epidural steroid injections in the past  . Chronic combined systolic and diastolic CHF (congestive heart failure)     EF 35-40% on echo 09/2013  . Chronic atrial fibrillation     a. Previously failed DCCV/amio/tikosyn  . NSVT (nonsustained ventricular tachycardia)     a. Isolated event during 09/2007 adm.  . Brady-tachy syndrome     s/p permanent pacemaker placement  . Hypertension     ROS:   All systems reviewed and negative except as noted in the HPI.   Past Surgical History  Procedure Laterality Date  . Valve replacement      sever mitral regurgitation s/p mitral valve annuloplasty ring  . Cholecystectomy    . Appendectomy    . Irrigation and debridement abscess N/A 10/14/2013    Procedure: IRRIGATION AND DEBRIDEMENT PERINEAL ABSCESS;  Surgeon: Rolm Bookbinder, MD;  Location: [facility] OR;  Service: General;  Laterality: N/A;     Family History  Problem Relation Age of Onset  .  Pancreatic cancer Father   . Other Mother     Mother died at 32 with no real medical problems     History   Social History  . Marital Status: Single    Spouse Name: N/A    Number of Children: N/A  . Years of Education: N/A   Occupational History  . Not on file.   Social History Main Topics  . Smoking status: Former Smoker    Quit date: 10/11/1971  . Smokeless tobacco: Not on file  . Alcohol Use: 0.0 oz/week     Comment: rare  . Drug Use: No  . Sexual Activity: Not on file   Other Topics Concern  . Not on file   Social History Narrative     BP 144/72 mmHg  Pulse 85  Ht 5\' 2"  (1.575 m)  Wt 216 lb 12.8 oz (98.34 kg)  BMI 39.64 kg/m2  LMP 10/10/2013  Physical Exam:  Well appearing obese, elderly woman, NAD HEENT:  Unremarkable Neck:  7 cm JVD, no thyromegally Back:  No CVA tenderness Lungs:  Clear with no wheezes HEART:  Regular rate rhythm, no murmurs, no rubs, no clicks Abd:  soft, positive bowel sounds, no organomegally, no rebound, no guarding Ext:  2 plus pulses, no edema, no cyanosis, no clubbing Skin:  No rashes no nodules Neuro:  CN II through XII intact, motor grossly intact   DEVICE  Normal device function.  See PaceArt for details.  Assess/Plan:

## 2014-11-15 ENCOUNTER — Other Ambulatory Visit: Payer: Self-pay | Admitting: *Deleted

## 2014-11-15 DIAGNOSIS — I5042 Chronic combined systolic (congestive) and diastolic (congestive) heart failure: Secondary | ICD-10-CM

## 2014-11-15 DIAGNOSIS — I482 Chronic atrial fibrillation, unspecified: Secondary | ICD-10-CM

## 2014-11-28 ENCOUNTER — Encounter: Payer: Medicare Other | Admitting: Internal Medicine

## 2014-11-29 ENCOUNTER — Ambulatory Visit (INDEPENDENT_AMBULATORY_CARE_PROVIDER_SITE_OTHER): Payer: Medicare Other | Admitting: Pharmacist

## 2014-11-29 DIAGNOSIS — I4891 Unspecified atrial fibrillation: Secondary | ICD-10-CM

## 2014-11-29 DIAGNOSIS — Z5181 Encounter for therapeutic drug level monitoring: Secondary | ICD-10-CM

## 2014-11-29 LAB — POCT INR: INR: 3.2

## 2014-12-06 ENCOUNTER — Other Ambulatory Visit (INDEPENDENT_AMBULATORY_CARE_PROVIDER_SITE_OTHER): Payer: Medicare Other | Admitting: *Deleted

## 2014-12-06 DIAGNOSIS — Z950 Presence of cardiac pacemaker: Secondary | ICD-10-CM

## 2014-12-06 DIAGNOSIS — I482 Chronic atrial fibrillation, unspecified: Secondary | ICD-10-CM

## 2014-12-06 DIAGNOSIS — I5042 Chronic combined systolic (congestive) and diastolic (congestive) heart failure: Secondary | ICD-10-CM

## 2014-12-06 LAB — CBC WITH DIFFERENTIAL/PLATELET
BASOS ABS: 0 10*3/uL (ref 0.0–0.1)
Basophils Relative: 0.5 % (ref 0.0–3.0)
EOS ABS: 0.2 10*3/uL (ref 0.0–0.7)
EOS PCT: 3.4 % (ref 0.0–5.0)
HCT: 27.5 % — ABNORMAL LOW (ref 36.0–46.0)
Hemoglobin: 9.1 g/dL — ABNORMAL LOW (ref 12.0–15.0)
Lymphocytes Relative: 39 % (ref 12.0–46.0)
Lymphs Abs: 2.2 10*3/uL (ref 0.7–4.0)
MCHC: 32.9 g/dL (ref 30.0–36.0)
MCV: 77.8 fl — ABNORMAL LOW (ref 78.0–100.0)
MONO ABS: 0.6 10*3/uL (ref 0.1–1.0)
Monocytes Relative: 9.9 % (ref 3.0–12.0)
NEUTROS PCT: 47.2 % (ref 43.0–77.0)
Neutro Abs: 2.7 10*3/uL (ref 1.4–7.7)
Platelets: 153 10*3/uL (ref 150.0–400.0)
RBC: 3.54 Mil/uL — ABNORMAL LOW (ref 3.87–5.11)
RDW: 17.9 % — AB (ref 11.5–15.5)
WBC: 5.7 10*3/uL (ref 4.0–10.5)

## 2014-12-06 LAB — BASIC METABOLIC PANEL
BUN: 29 mg/dL — ABNORMAL HIGH (ref 6–23)
CO2: 28 mEq/L (ref 19–32)
Calcium: 8.6 mg/dL (ref 8.4–10.5)
Chloride: 107 mEq/L (ref 96–112)
Creatinine, Ser: 1.39 mg/dL — ABNORMAL HIGH (ref 0.40–1.20)
GFR: 38.19 mL/min — AB (ref >60.00)
GLUCOSE: 115 mg/dL — AB (ref 70–99)
Potassium: 4.1 mEq/L (ref 3.5–5.1)
Sodium: 140 mEq/L (ref 135–145)

## 2014-12-06 LAB — PROTIME-INR
INR: 2.3 ratio — ABNORMAL HIGH (ref 0.8–1.0)
Prothrombin Time: 25.2 s — ABNORMAL HIGH (ref 9.6–13.1)

## 2014-12-10 ENCOUNTER — Other Ambulatory Visit: Payer: Self-pay | Admitting: Cardiology

## 2014-12-12 DIAGNOSIS — Z87891 Personal history of nicotine dependence: Secondary | ICD-10-CM | POA: Diagnosis not present

## 2014-12-12 DIAGNOSIS — I5022 Chronic systolic (congestive) heart failure: Secondary | ICD-10-CM | POA: Diagnosis not present

## 2014-12-12 DIAGNOSIS — Z950 Presence of cardiac pacemaker: Secondary | ICD-10-CM | POA: Diagnosis not present

## 2014-12-12 DIAGNOSIS — Z79899 Other long term (current) drug therapy: Secondary | ICD-10-CM | POA: Diagnosis not present

## 2014-12-12 DIAGNOSIS — I129 Hypertensive chronic kidney disease with stage 1 through stage 4 chronic kidney disease, or unspecified chronic kidney disease: Secondary | ICD-10-CM | POA: Diagnosis not present

## 2014-12-12 DIAGNOSIS — N189 Chronic kidney disease, unspecified: Secondary | ICD-10-CM | POA: Diagnosis not present

## 2014-12-12 DIAGNOSIS — I482 Chronic atrial fibrillation: Secondary | ICD-10-CM | POA: Diagnosis present

## 2014-12-12 MED ORDER — CHLORHEXIDINE GLUCONATE 4 % EX LIQD
60.0000 mL | Freq: Once | CUTANEOUS | Status: DC
  Filled 2014-12-12: qty 60

## 2014-12-12 MED ORDER — GENTAMICIN SULFATE 40 MG/ML IJ SOLN
80.0000 mg | INTRAMUSCULAR | Status: DC
  Filled 2014-12-12: qty 2

## 2014-12-12 MED ORDER — SODIUM CHLORIDE 0.9 % IJ SOLN: 3.0000 mL | Freq: Two times a day (BID) | INTRAMUSCULAR | Status: DC

## 2014-12-12 MED ORDER — SODIUM CHLORIDE 0.9 % IJ SOLN: 3.0000 mL | INTRAMUSCULAR | Status: DC | PRN

## 2014-12-12 MED ORDER — CEFAZOLIN SODIUM-DEXTROSE 2-3 GM-% IV SOLR: 2.0000 g | INTRAVENOUS | Status: DC

## 2014-12-12 MED ORDER — SODIUM CHLORIDE 0.9 % IV SOLN
INTRAVENOUS | Status: DC
  Administered 2014-12-13: 07:00:00 via INTRAVENOUS

## 2014-12-12 MED ORDER — SODIUM CHLORIDE 0.9 % IV SOLN: 250.0000 mL | INTRAVENOUS | Status: DC

## 2014-12-13 ENCOUNTER — Ambulatory Visit (HOSPITAL_COMMUNITY)
Admission: RE | Admit: 2014-12-13 | Discharge: 2014-12-13 | Disposition: A | Discharge status: Home / Self Care | Payer: Medicare Other | Source: Ambulatory Visit | Attending: Internal Medicine | Admitting: Internal Medicine

## 2014-12-13 ENCOUNTER — Encounter (HOSPITAL_COMMUNITY): Payer: Self-pay | Admitting: Internal Medicine

## 2014-12-13 ENCOUNTER — Encounter (HOSPITAL_COMMUNITY)
Admission: RE | Disposition: A | Discharge status: Home / Self Care | Payer: Self-pay | Source: Ambulatory Visit | Attending: Internal Medicine

## 2014-12-13 DIAGNOSIS — Z950 Presence of cardiac pacemaker: Secondary | ICD-10-CM | POA: Insufficient documentation

## 2014-12-13 DIAGNOSIS — I482 Chronic atrial fibrillation, unspecified: Secondary | ICD-10-CM

## 2014-12-13 DIAGNOSIS — I442 Atrioventricular block, complete: Secondary | ICD-10-CM

## 2014-12-13 DIAGNOSIS — I5042 Chronic combined systolic (congestive) and diastolic (congestive) heart failure: Secondary | ICD-10-CM

## 2014-12-13 DIAGNOSIS — I129 Hypertensive chronic kidney disease with stage 1 through stage 4 chronic kidney disease, or unspecified chronic kidney disease: Secondary | ICD-10-CM | POA: Insufficient documentation

## 2014-12-13 DIAGNOSIS — N189 Chronic kidney disease, unspecified: Secondary | ICD-10-CM | POA: Insufficient documentation

## 2014-12-13 DIAGNOSIS — I5022 Chronic systolic (congestive) heart failure: Secondary | ICD-10-CM | POA: Insufficient documentation

## 2014-12-13 DIAGNOSIS — Z87891 Personal history of nicotine dependence: Secondary | ICD-10-CM | POA: Insufficient documentation

## 2014-12-13 DIAGNOSIS — Z45010 Encounter for checking and testing of cardiac pacemaker pulse generator [battery]: Secondary | ICD-10-CM

## 2014-12-13 DIAGNOSIS — Z79899 Other long term (current) drug therapy: Secondary | ICD-10-CM | POA: Insufficient documentation

## 2014-12-13 HISTORY — PX: BIV PACEMAKER GENERATOR CHANGE OUT: SHX5746

## 2014-12-13 LAB — SURGICAL PCR SCREEN
MRSA, PCR: NEGATIVE
Staphylococcus aureus: NEGATIVE

## 2014-12-13 LAB — PROTIME-INR
INR: 1.4 (ref 0.00–1.49)
Prothrombin Time: 17.3 seconds — ABNORMAL HIGH (ref 11.6–15.2)

## 2014-12-13 SURGERY — BIV PACEMAKER GENERATOR CHANGE OUT
Anesthesia: LOCAL

## 2014-12-13 MED ORDER — MUPIROCIN 2 % EX OINT: 1.0000 "application " | TOPICAL_OINTMENT | Freq: Once | CUTANEOUS | Status: DC

## 2014-12-13 MED ORDER — MIDAZOLAM HCL 5 MG/5ML IJ SOLN
INTRAMUSCULAR | Status: AC
  Filled 2014-12-13: qty 5

## 2014-12-13 MED ORDER — FENTANYL CITRATE 0.05 MG/ML IJ SOLN
INTRAMUSCULAR | Status: AC
  Filled 2014-12-13: qty 2

## 2014-12-13 MED ORDER — LIDOCAINE HCL (PF) 1 % IJ SOLN
INTRAMUSCULAR | Status: AC
  Filled 2014-12-13: qty 60

## 2014-12-13 MED ORDER — ONDANSETRON HCL 4 MG/2ML IJ SOLN: 4.0000 mg | Freq: Four times a day (QID) | INTRAMUSCULAR | Status: DC | PRN

## 2014-12-13 MED ORDER — MUPIROCIN 2 % EX OINT
TOPICAL_OINTMENT | CUTANEOUS | Status: AC
  Administered 2014-12-13: 1 via NASAL
  Filled 2014-12-13: qty 22

## 2014-12-13 MED ORDER — CEFAZOLIN SODIUM-DEXTROSE 2-3 GM-% IV SOLR
INTRAVENOUS | Status: AC
  Filled 2014-12-13: qty 50

## 2014-12-13 MED ORDER — ACETAMINOPHEN 325 MG PO TABS
325.0000 mg | ORAL_TABLET | ORAL | Status: DC | PRN
  Filled 2014-12-13: qty 2

## 2014-12-13 NOTE — Interval H&P Note (Signed)
History and Physical Interval Note:  12/13/2014 7:21 AM  [name]  has presented today for surgery, with the diagnosis of battery recall, afib, chf  The various methods of treatment have been discussed with the patient and family. After consideration of risks, benefits and other options for treatment, the patient has consented to  Procedure(s): BIV PACEMAKER GENERATOR CHANGE OUT (N/A) as a surgical intervention .  [name] history has been reviewed, patient examined, no change in status, stable for surgery.  I have reviewed the patient's chart and labs.  Questions were answered to the patient's satisfaction.     Mikle Bosworth.D.

## 2014-12-13 NOTE — H&P (View-Only) (Signed)
HPI [name] returns today for followup. [name] is a pleasant 81 yo [name] with a h/o CHB, chronic atrial fibrillation and orthostasis.  [name] denies chest pain, sob, or syncope. [name] is approximately one year from ERI. [name] has been found to have a PPM which is on active recall. No other complaints.  No Known Allergies   Current Outpatient Prescriptions  Medication Sig Dispense Refill  . acetaminophen (TYLENOL) 500 MG tablet Take 500 mg by mouth every 6 (six) hours as needed for mild pain.    . beta carotene w/minerals (OCUVITE) tablet Take 1 tablet by mouth every evening.     . cetirizine (ZYRTEC) 10 MG tablet Take 10 mg by mouth daily as needed for allergies.     [name] colchicine 0.6 MG tablet Take 0.6 mg by mouth daily.     . digoxin (LANOXIN) 0.125 MG tablet Take 0.5 tablets (0.0625 mg total) by mouth daily. 30 tablet 3  . ferrous sulfate 325 (65 FE) MG tablet Take 325 mg by mouth daily with supper.     . furosemide (LASIX) 20 MG tablet Take 2 tablets by mouth daily.  3  . KLOR-CON M20 20 MEQ tablet TAKE 1 TABLET EVERY DAY (Patient taking differently: TAKE 1 TABLET BY MOUTH EVERY DAY) 30 tablet 6  . midodrine (PROAMATINE) 2.5 MG tablet Take 1 tablet (2.5 mg total) by mouth 3 (three) times daily with meals. 90 tablet 11  . Multiple Vitamin (MULTIVITAMIN WITH MINERALS) TABS tablet Take 1 tablet by mouth daily.    . Oxymetazoline HCl (NOSTRILLA CONGESTION RELIEF NA) Place 1 spray into the nose daily as needed (for congestion).    . warfarin (COUMADIN) 5 MG tablet TAKE AS DIRECTED BY ANTICOAGULATION CLINIC 90 tablet 1   No current facility-administered medications for this visit.     Past Medical History  Diagnosis Date  . Mitral valve disorder     a. Severe MR s/p repair 2003 (28 mm annuloplasty ring and and oversew of LAA). No CAD by cath at that time.  . Pacemaker     a. 2003: post-op afib after MR repair then symptomatic bradycardia requiring pacemaker. b. Upgrade to Medtronic Bi-V  Pacemaker 2007.  [name] Cirrhosis     a. Newly recognized 09/2013 - by CT.  . CKD (chronic kidney disease)   . Pulmonary hypertension, mild     secondary to mild/mod LV dysfunction(EF 40%)  . Diastolic dysfunction   . Spinal stenosis     shes recieved epidural steroid injections in the past  . Chronic combined systolic and diastolic CHF (congestive heart failure)     EF 35-40% on echo 09/2013  . Chronic atrial fibrillation     a. Previously failed DCCV/amio/tikosyn  . NSVT (nonsustained ventricular tachycardia)     a. Isolated event during 09/2007 adm.  . Brady-tachy syndrome     s/p permanent pacemaker placement  . Hypertension     ROS:   All systems reviewed and negative except as noted in the HPI.   Past Surgical History  Procedure Laterality Date  . Valve replacement      sever mitral regurgitation s/p mitral valve annuloplasty ring  . Cholecystectomy    . Appendectomy    . Irrigation and debridement abscess N/A 10/14/2013    Procedure: IRRIGATION AND DEBRIDEMENT PERINEAL ABSCESS;  Surgeon: Rolm Bookbinder, MD;  Location: [facility] OR;  Service: General;  Laterality: N/A;     Family History  Problem Relation Age of Onset  .  Pancreatic cancer Father   . Other Mother     Mother died at 93 with no real medical problems     History   Social History  . Marital Status: Single    Spouse Name: N/A    Number of Children: N/A  . Years of Education: N/A   Occupational History  . Not on file.   Social History Main Topics  . Smoking status: Former Smoker    Quit date: 10/11/1971  . Smokeless tobacco: Not on file  . Alcohol Use: 0.0 oz/week     Comment: rare  . Drug Use: No  . Sexual Activity: Not on file   Other Topics Concern  . Not on file   Social History Narrative     BP 144/72 mmHg  Pulse 85  Ht 5\' 2"  (1.575 m)  Wt 216 lb 12.8 oz (98.34 kg)  BMI 39.64 kg/m2  LMP 10/10/2013  Physical Exam:  Well appearing obese, [name], NAD HEENT:  Unremarkable Neck:  7 cm JVD, no thyromegally Back:  No CVA tenderness Lungs:  Clear with no wheezes HEART:  Regular rate rhythm, no murmurs, no rubs, no clicks Abd:  soft, positive bowel sounds, no organomegally, no rebound, no guarding Ext:  2 plus pulses, no edema, no cyanosis, no clubbing Skin:  No rashes no nodules Neuro:  CN II through XII intact, motor grossly intact   DEVICE  Normal device function.  See PaceArt for details.  Assess/Plan:

## 2014-12-13 NOTE — Discharge Instructions (Signed)
Pacemaker Battery Change, Care After °Refer to this sheet in the next few weeks. These instructions provide you with information on caring for yourself after your procedure. Your health care provider may also give you more specific instructions. Your treatment has been planned according to current medical practices, but problems sometimes occur. Call your health care provider if you have any problems or questions after your procedure. °WHAT TO EXPECT AFTER THE PROCEDURE °After your procedure, it is typical to have the following sensations: °· Soreness at the pacemaker site. °HOME CARE INSTRUCTIONS  °· Keep the incision clean and dry. °· Unless advised otherwise, you may shower beginning 48 hours after your procedure. °· For the first week after the replacement, avoid stretching motions that pull at the incision site, and avoid heavy exercise with the arm that is on the same side as the incision. °· Take medicines only as directed by your health care provider. °· Keep all follow-up visits as directed by your health care provider. °SEEK MEDICAL CARE IF:  °· You have pain at the incision site that is not relieved by over-the-counter or prescription medicine. °· There is drainage or pus from the incision site. °· There is swelling larger than a lime at the incision site. °· You develop red streaking that extends above or below the incision site. °· You feel brief, intermittent palpitations, light-headedness, or any symptoms that you feel might be related to your heart. °SEEK IMMEDIATE MEDICAL CARE IF:  °· You experience chest pain that is different than the pain at the pacemaker site. °· You experience shortness of breath. °· You have palpitations or irregular heartbeat. °· You have light-headedness that does not go away quickly. °· You faint. °· You have pain that gets worse and is not relieved by medicine. °Document Released: 08/24/2013 Document Revised: 03/20/2014 Document Reviewed: 08/24/2013 °ExitCare® Patient  Information ©2015 [employer]. This information is not intended to replace advice given to you by your health care provider. Make sure you discuss any questions you have with your health care provider. ° °

## 2014-12-13 NOTE — CV Procedure (Signed)
EP Procedure Note  Procedure: BiV PPM generator change out  Preprocedure diagnosis: Complete heart block, chronic systolic heart failure, s/p BiV PPM insertion with the current device at [facility]  Postprocedure diagnosis: Same as preprocedure diagnosis  Description of the procedure: After informed consent was obtained, the patient was taken to the diagnostic electrophysiology laboratory in the fasting state. After the usual preparation and draping, intravenous Versed and fentanyl were used for sedation. 30 cc of lidocaine was infiltrated into the left infraclavicular region. A 5 cm incision was carried out. Electrocautery was utilized to dissect down to the pacemaker pocket. The pacemaker pocket was entered. The generator was removed. The left ventricular lead was disconnected and evaluated. The pacing threshold was a volt at 0.5 ms. Next the right ventricular and right atrial leads were disconnected. The new Medtronic biventricular pacemaker, serial [id number], was connected to the old right ventricular, left ventricular, and right atrial pacing leads. The pocket was irrigated with antibiotic irrigation. The new pacemaker was inserted back into the old pocket. The pocket was irrigated with additional antibiotic irrigation. The incision was closed with 2 layers of Vicryl suture. Benzoin and Steri-Strips were painted on the skin. The patient was returned to the recovery area in satisfactory condition.  Complications: There were no immediate procedural complications  Estimated blood loss: Less than 5 cc  Conclusion: Successful removal of a previously implanted biventricular pacemaker which had reached elective replacement, and insertion of a new biventricular pacemaker in a patient with complete heart block, and chronic systolic heart failure.  Cristopher Peru, M.D.

## 2014-12-15 ENCOUNTER — Other Ambulatory Visit: Payer: Self-pay | Admitting: *Deleted

## 2014-12-15 ENCOUNTER — Telehealth: Payer: Self-pay | Admitting: Internal Medicine

## 2014-12-15 NOTE — Telephone Encounter (Signed)
Spoke w/pt and advised not to get wet with soap and water x 5 days. Pt has appointment with device clinic for 12-21-14. Pt advised to call if any redness or swelling occurs. Pt understands.

## 2014-12-15 NOTE — Telephone Encounter (Signed)
Pt had change out Wednesday, took off bandage but accidentally took off the steri strips , look ok , has wound ck 12-21-14, is there anything she needs to do in the meantime?

## 2014-12-21 ENCOUNTER — Ambulatory Visit (INDEPENDENT_AMBULATORY_CARE_PROVIDER_SITE_OTHER): Payer: Medicare Other | Admitting: *Deleted

## 2014-12-21 DIAGNOSIS — I482 Chronic atrial fibrillation, unspecified: Secondary | ICD-10-CM

## 2014-12-21 DIAGNOSIS — Z5181 Encounter for therapeutic drug level monitoring: Secondary | ICD-10-CM

## 2014-12-21 DIAGNOSIS — I5042 Chronic combined systolic (congestive) and diastolic (congestive) heart failure: Secondary | ICD-10-CM

## 2014-12-21 DIAGNOSIS — I4891 Unspecified atrial fibrillation: Secondary | ICD-10-CM

## 2014-12-21 LAB — MDC_IDC_ENUM_SESS_TYPE_INCLINIC
Battery Voltage: 3.13 V
Brady Statistic AP VP Percent: 0 %
Brady Statistic AP VS Percent: 0 %
Brady Statistic AS VP Percent: 98.65 %
Brady Statistic RV Percent Paced: 98.65 %
Date Time Interrogation Session: 20160204124616
Lead Channel Impedance Value: 323 Ohm
Lead Channel Impedance Value: 361 Ohm
Lead Channel Impedance Value: 475 Ohm
Lead Channel Impedance Value: 551 Ohm
Lead Channel Impedance Value: 570 Ohm
Lead Channel Impedance Value: 570 Ohm
Lead Channel Impedance Value: 646 Ohm
Lead Channel Pacing Threshold Amplitude: 0.75 V
Lead Channel Pacing Threshold Amplitude: 0.75 V
Lead Channel Sensing Intrinsic Amplitude: 0.5 mV
Lead Channel Sensing Intrinsic Amplitude: 1.375 mV
Lead Channel Setting Pacing Amplitude: 2.5 V
Lead Channel Setting Pacing Pulse Width: 0.4 ms
MDC IDC MSMT LEADCHNL LV IMPEDANCE VALUE: 855 Ohm
MDC IDC MSMT LEADCHNL LV PACING THRESHOLD PULSEWIDTH: 0.4 ms
MDC IDC MSMT LEADCHNL RV IMPEDANCE VALUE: 532 Ohm
MDC IDC MSMT LEADCHNL RV PACING THRESHOLD PULSEWIDTH: 0.4 ms
MDC IDC MSMT LEADCHNL RV SENSING INTR AMPL: 31 mV
MDC IDC SET LEADCHNL LV PACING AMPLITUDE: 2 V
MDC IDC SET LEADCHNL LV PACING PULSEWIDTH: 0.6 ms
MDC IDC SET LEADCHNL RV SENSING SENSITIVITY: 5.6 mV
MDC IDC STAT BRADY AS VS PERCENT: 1.35 %
MDC IDC STAT BRADY RA PERCENT PACED: 0 %
Zone Setting Detection Interval: 350 ms
Zone Setting Detection Interval: 400 ms

## 2014-12-21 LAB — POCT INR: INR: 1.5

## 2014-12-21 NOTE — Progress Notes (Signed)
Wound check appointment. Steri-strips removed by the patient. Wound without redness or edema. Incision edges approximated, wound well healed. Normal device function. Thresholds, sensing, and impedances consistent with implant measurements. Device programmed at 3.5V/auto capture programmed on for extra safety margin until 3 month visit. Histogram distribution appropriate for patient and level of activity. No high ventricular rates noted. Patient educated about wound care, arm mobility, lifting restrictions. ROV in 3 months with implanting physician.

## 2014-12-29 ENCOUNTER — Encounter: Payer: Self-pay | Admitting: Cardiology

## 2015-01-01 ENCOUNTER — Other Ambulatory Visit: Payer: Self-pay | Admitting: Cardiology

## 2015-01-03 ENCOUNTER — Ambulatory Visit (INDEPENDENT_AMBULATORY_CARE_PROVIDER_SITE_OTHER): Payer: Medicare Other | Admitting: *Deleted

## 2015-01-03 DIAGNOSIS — Z5181 Encounter for therapeutic drug level monitoring: Secondary | ICD-10-CM

## 2015-01-03 DIAGNOSIS — I4891 Unspecified atrial fibrillation: Secondary | ICD-10-CM

## 2015-01-03 LAB — POCT INR: INR: 1.8

## 2015-01-05 ENCOUNTER — Encounter: Payer: Self-pay | Admitting: Internal Medicine

## 2015-01-07 ENCOUNTER — Other Ambulatory Visit: Payer: Self-pay | Admitting: Cardiology

## 2015-01-17 ENCOUNTER — Ambulatory Visit (INDEPENDENT_AMBULATORY_CARE_PROVIDER_SITE_OTHER): Payer: Medicare Other | Admitting: *Deleted

## 2015-01-17 DIAGNOSIS — I4891 Unspecified atrial fibrillation: Secondary | ICD-10-CM

## 2015-01-17 DIAGNOSIS — Z5181 Encounter for therapeutic drug level monitoring: Secondary | ICD-10-CM

## 2015-01-17 LAB — POCT INR: INR: 2.1

## 2015-01-23 ENCOUNTER — Encounter: Payer: Medicare Other | Admitting: Internal Medicine

## 2015-02-07 ENCOUNTER — Ambulatory Visit (INDEPENDENT_AMBULATORY_CARE_PROVIDER_SITE_OTHER): Payer: Medicare Other | Admitting: *Deleted

## 2015-02-07 DIAGNOSIS — I4891 Unspecified atrial fibrillation: Secondary | ICD-10-CM

## 2015-02-07 DIAGNOSIS — Z5181 Encounter for therapeutic drug level monitoring: Secondary | ICD-10-CM | POA: Diagnosis not present

## 2015-02-07 LAB — POCT INR: INR: 2.7

## 2015-03-05 ENCOUNTER — Other Ambulatory Visit: Payer: Self-pay | Admitting: Cardiology

## 2015-03-07 ENCOUNTER — Ambulatory Visit (INDEPENDENT_AMBULATORY_CARE_PROVIDER_SITE_OTHER): Payer: Medicare Other | Admitting: Surgery

## 2015-03-07 DIAGNOSIS — I4891 Unspecified atrial fibrillation: Secondary | ICD-10-CM | POA: Diagnosis not present

## 2015-03-07 DIAGNOSIS — Z5181 Encounter for therapeutic drug level monitoring: Secondary | ICD-10-CM

## 2015-03-07 LAB — POCT INR: INR: 2.7

## 2015-03-15 ENCOUNTER — Ambulatory Visit (INDEPENDENT_AMBULATORY_CARE_PROVIDER_SITE_OTHER): Payer: Medicare Other | Admitting: Internal Medicine

## 2015-03-15 ENCOUNTER — Encounter: Payer: Self-pay | Admitting: Internal Medicine

## 2015-03-15 VITALS — BP 120/52 | HR 78 | Ht 62.0 in | Wt 223.0 lb

## 2015-03-15 DIAGNOSIS — I10 Essential (primary) hypertension: Secondary | ICD-10-CM

## 2015-03-15 DIAGNOSIS — I5042 Chronic combined systolic (congestive) and diastolic (congestive) heart failure: Secondary | ICD-10-CM | POA: Diagnosis not present

## 2015-03-15 DIAGNOSIS — Z950 Presence of cardiac pacemaker: Secondary | ICD-10-CM

## 2015-03-15 DIAGNOSIS — I482 Chronic atrial fibrillation, unspecified: Secondary | ICD-10-CM

## 2015-03-15 LAB — MDC_IDC_ENUM_SESS_TYPE_INCLINIC
Battery Remaining Longevity: 95 mo
Battery Voltage: 3.05 V
Brady Statistic AP VP Percent: 0 %
Brady Statistic AS VP Percent: 97.88 %
Brady Statistic RA Percent Paced: 0 %
Brady Statistic RV Percent Paced: 97.88 %
Date Time Interrogation Session: 20160428102122
Lead Channel Impedance Value: 342 Ohm
Lead Channel Impedance Value: 342 Ohm
Lead Channel Impedance Value: 475 Ohm
Lead Channel Impedance Value: 551 Ohm
Lead Channel Impedance Value: 627 Ohm
Lead Channel Impedance Value: 817 Ohm
Lead Channel Pacing Threshold Amplitude: 0.75 V
Lead Channel Sensing Intrinsic Amplitude: 0.6 mV
Lead Channel Sensing Intrinsic Amplitude: 20 mV
Lead Channel Setting Pacing Amplitude: 1.75 V
Lead Channel Setting Pacing Amplitude: 2.5 V
Lead Channel Setting Pacing Pulse Width: 0.6 ms
Lead Channel Setting Sensing Sensitivity: 5.6 mV
MDC IDC MSMT LEADCHNL LV IMPEDANCE VALUE: 551 Ohm
MDC IDC MSMT LEADCHNL LV PACING THRESHOLD PULSEWIDTH: 0.6 ms
MDC IDC MSMT LEADCHNL RV IMPEDANCE VALUE: 551 Ohm
MDC IDC MSMT LEADCHNL RV IMPEDANCE VALUE: 570 Ohm
MDC IDC MSMT LEADCHNL RV PACING THRESHOLD AMPLITUDE: 0.5 V
MDC IDC MSMT LEADCHNL RV PACING THRESHOLD PULSEWIDTH: 0.4 ms
MDC IDC SET LEADCHNL RV PACING PULSEWIDTH: 0.4 ms
MDC IDC SET ZONE DETECTION INTERVAL: 400 ms
MDC IDC STAT BRADY AP VS PERCENT: 0 %
MDC IDC STAT BRADY AS VS PERCENT: 2.12 %
Zone Setting Detection Interval: 350 ms

## 2015-03-15 NOTE — Assessment & Plan Note (Signed)
Her blood pressure is well controlled. She has had problems with low blood pressure in the past. She will continue her current medications.

## 2015-03-15 NOTE — Progress Notes (Signed)
HPI [name] returns today for followup. She is a pleasant 81 yo woman with a h/o CHB, chronic atrial fibrillation and orthostasis.  She denies chest pain, sob, or syncope. She has undergone biv PM gen change. In the interim she has been stable with no syncope. She admits to dietary indiscretion and has gained 6 lbs. No sob.  No other complaints.  No Known Allergies   Current Outpatient Prescriptions  Medication Sig Dispense Refill  . acetaminophen (TYLENOL) 500 MG tablet Take 500 mg by mouth every 6 (six) hours as needed for mild pain.    . beta carotene w/minerals (OCUVITE) tablet Take 1 tablet by mouth every evening.     . cetirizine (ZYRTEC) 10 MG tablet Take 10 mg by mouth daily as needed for allergies.     [name] colchicine 0.6 MG tablet Take 0.6 mg by mouth daily.     . digoxin (LANOXIN) 0.125 MG tablet TAKE 0.5 TABLETS (0.0625 MG TOTAL) BY MOUTH DAILY. 30 tablet 3  . ferrous sulfate 325 (65 FE) MG tablet Take 325 mg by mouth daily with supper.     . furosemide (LASIX) 20 MG tablet TAKE 2 TABLETS ONCE DAILY OR USE AS DIRECTED 90 tablet 3  . KLOR-CON M20 20 MEQ tablet TAKE 1 TABLET EVERY DAY (Patient taking differently: TAKE 1 TABLET BY MOUTH EVERY DAY) 30 tablet 6  . midodrine (PROAMATINE) 2.5 MG tablet TAKE 1 TABLET THREE TIMES A DAY WITH MEALS 90 tablet 1  . Multiple Vitamin (MULTIVITAMIN WITH MINERALS) TABS tablet Take 1 tablet by mouth daily.    . multivitamin-lutein (OCUVITE-LUTEIN) CAPS capsule Take 1 capsule by mouth daily.    . Oxymetazoline HCl (NOSTRILLA CONGESTION RELIEF NA) Place 1 spray into the nose daily as needed (for congestion).    . warfarin (COUMADIN) 5 MG tablet TAKE AS DIRECTED BY ANTICOAGULATION CLINIC (Patient taking differently: TAKE AS DIRECTED BY ANTICOAGULATION CLINIC- Monday Tuesday Wednesday and Friday take 2.5mg  and on Thursday Saturday and Sunday take 5mg ) 90 tablet 1   No current facility-administered medications for this visit.     Past  Medical History  Diagnosis Date  . Mitral valve disorder     a. Severe MR s/p repair 2003 (28 mm annuloplasty ring and and oversew of LAA). No CAD by cath at that time.  . Pacemaker     a. 2003: post-op afib after MR repair then symptomatic bradycardia requiring pacemaker. b. Upgrade to Medtronic Bi-V Pacemaker 2007.  [name] Cirrhosis     a. Newly recognized 09/2013 - by CT.  . CKD (chronic kidney disease)   . Pulmonary hypertension, mild     secondary to mild/mod LV dysfunction(EF 40%)  . Diastolic dysfunction   . Spinal stenosis     shes recieved epidural steroid injections in the past  . Chronic combined systolic and diastolic CHF (congestive heart failure)     EF 35-40% on echo 09/2013  . Chronic atrial fibrillation     a. Previously failed DCCV/amio/tikosyn  . NSVT (nonsustained ventricular tachycardia)     a. Isolated event during 09/2007 adm.  . Brady-tachy syndrome     s/p permanent pacemaker placement  . Hypertension     ROS:   All systems reviewed and negative except as noted in the HPI.   Past Surgical History  Procedure Laterality Date  . Valve replacement      sever mitral regurgitation s/p mitral valve annuloplasty ring  . Cholecystectomy    .  Appendectomy    . Irrigation and debridement abscess N/A 10/14/2013    Procedure: IRRIGATION AND DEBRIDEMENT PERINEAL ABSCESS;  Surgeon: Rolm Bookbinder, MD;  Location: [place];  Service: General;  Laterality: N/A;  . Biv pacemaker generator change out N/A 12/13/2014    Procedure: BIV PACEMAKER GENERATOR CHANGE OUT;  Surgeon: Evans Lance, MD;  Location: [facility] CATH LAB;  Service: Cardiovascular;  Laterality: N/A;     Family History  Problem Relation Age of Onset  . Pancreatic cancer Father   . Other Mother     Mother died at 41 with no real medical problems     History   Social History  . Marital Status: Single    Spouse Name: N/A  . Number of Children: N/A  . Years of Education: N/A   Occupational History  .  Not on file.   Social History Main Topics  . Smoking status: Former Smoker    Quit date: 10/11/1971  . Smokeless tobacco: Not on file  . Alcohol Use: 0.0 oz/week     Comment: rare  . Drug Use: No  . Sexual Activity: Not on file   Other Topics Concern  . Not on file   Social History Narrative     BP 120/52 mmHg  Pulse 78  Ht 5\' 2"  (1.575 m)  Wt 223 lb (101.152 kg)  BMI 40.78 kg/m2  LMP 10/10/2013  Physical Exam:  Well appearing obese, elderly woman, NAD HEENT: Unremarkable Neck:  7 cm JVD, no thyromegally Back:  No CVA tenderness Lungs:  Clear with no wheezes HEART:  Regular rate rhythm, no murmurs, no rubs, no clicks Abd:  soft, positive bowel sounds, no organomegally, no rebound, no guarding Ext:  2 plus pulses, trace peripheral edema, no cyanosis, no clubbing Skin:  No rashes no nodules Neuro:  CN II through XII intact, motor grossly intact   DEVICE  Normal device function.  See PaceArt for details.  Assess/Plan:

## 2015-03-15 NOTE — Patient Instructions (Signed)
Medication Instructions:  Your physician recommends that you continue on your current medications as directed. Please refer to the Current Medication list given to you today.   Labwork: None ordered  Testing/Procedures: None ordered  Follow-Up: Your physician wants you to follow-up in: 9 months with Dr Knox Saliva will receive a reminder letter in the mail two months in advance. If you don't receive a letter, please call our office to schedule the follow-up appointment.  Remote monitoring is used to monitor your Pacemaker of ICD from home. This monitoring reduces the number of office visits required to check your device to one time per year. It allows [place] to keep an eye on the functioning of your device to ensure it is working properly. You are scheduled for a device check from home on 06/14/15. You may send your transmission at any time that day. If you have a wireless device, the transmission will be sent automatically. After your physician reviews your transmission, you will receive a postcard with your next transmission date.    Any Other Special Instructions Will Be Listed Below (If Applicable).

## 2015-03-15 NOTE — Assessment & Plan Note (Signed)
Her ventricular rate is well controlled. She will continue her current medical therapy.

## 2015-03-15 NOTE — Assessment & Plan Note (Signed)
Her Medtronic biventricular pacemaker is working normally. Thresholds are all good. Her incision is well-healed. We'll plan to see her back in several months.

## 2015-03-29 ENCOUNTER — Encounter: Payer: Medicare Other | Admitting: Internal Medicine

## 2015-04-04 ENCOUNTER — Ambulatory Visit: Payer: Medicare Other | Admitting: Cardiology

## 2015-04-18 ENCOUNTER — Ambulatory Visit (INDEPENDENT_AMBULATORY_CARE_PROVIDER_SITE_OTHER): Payer: Medicare Other | Admitting: *Deleted

## 2015-04-18 DIAGNOSIS — I4891 Unspecified atrial fibrillation: Secondary | ICD-10-CM

## 2015-04-18 DIAGNOSIS — Z5181 Encounter for therapeutic drug level monitoring: Secondary | ICD-10-CM | POA: Diagnosis not present

## 2015-04-18 LAB — POCT INR: INR: 2.4

## 2015-04-22 ENCOUNTER — Other Ambulatory Visit: Payer: Self-pay | Admitting: Cardiology

## 2015-04-27 ENCOUNTER — Other Ambulatory Visit: Payer: Self-pay | Admitting: Cardiology

## 2015-04-27 ENCOUNTER — Encounter: Payer: Self-pay | Admitting: *Deleted

## 2015-05-03 ENCOUNTER — Other Ambulatory Visit: Payer: Self-pay | Admitting: Cardiology

## 2015-05-26 ENCOUNTER — Other Ambulatory Visit: Payer: Self-pay | Admitting: Cardiology

## 2015-05-31 ENCOUNTER — Ambulatory Visit (INDEPENDENT_AMBULATORY_CARE_PROVIDER_SITE_OTHER): Payer: Medicare Other | Admitting: Pharmacist

## 2015-05-31 ENCOUNTER — Other Ambulatory Visit: Payer: Self-pay

## 2015-05-31 ENCOUNTER — Telehealth: Payer: Self-pay | Admitting: Cardiology

## 2015-05-31 DIAGNOSIS — Z5181 Encounter for therapeutic drug level monitoring: Secondary | ICD-10-CM

## 2015-05-31 DIAGNOSIS — I4891 Unspecified atrial fibrillation: Secondary | ICD-10-CM | POA: Diagnosis not present

## 2015-05-31 DIAGNOSIS — R0602 Shortness of breath: Secondary | ICD-10-CM

## 2015-05-31 LAB — POCT INR: INR: 2.6

## 2015-05-31 MED ORDER — FUROSEMIDE 20 MG PO TABS: 40.0000 mg | ORAL_TABLET | Freq: Every day | ORAL | Status: DC

## 2015-05-31 NOTE — Telephone Encounter (Signed)
New Message   Pt c/o swelling: STAT is pt has developed SOB within 24 hours  1. How long have you been experiencing swelling? 05/31/15  2. Where is the swelling located? Abdomen and in her ankles  3.  Are you currently taking a "fluid pill"? Furosemide  4.  Are you currently SOB? yes  5.  Have you traveled recently? no

## 2015-05-31 NOTE — Telephone Encounter (Signed)
Pt daughter, Santiago Glad, calling in for pt. With pt in room as pt weight is up and having some SOB. Pt has noticed she has had decreased output for the past 3-10 days. Pt has c/o of pitting edema in ankles and abdomen.  Pt has some SOB at rest and with activity. Pt has no c/o of CP, palpitations, or dizziness. Pt has been checking weights but not writing then down but it is now up to 230 and was 223 at last OV. Pt takes 20 mg Lasix daily and prescribed and every other day for the past weeks she has taken an extra pill (10 mg).  Review pt complaints with DOD, [place]:  Orders for pt to take 60 mg of Lasix for the next THREE days then take 40 mg DAILY until next appointment with Dr. Radford Pax on 7/27.   Pt to come in for lab in 1 week (7/21) pt will be out of town and stated she can come in on Monday 7/25.  Pt scheduled for lab on 7/25 and reminded of appt on 7/27. Educated pt on checking and writing down BP and weights daily. Pt educated to call our office if BP gets to low, experiences dizziness, cramping, or her current symptoms get worse. Pt and daughter expressed understanding and no additional questions at this time.

## 2015-06-04 ENCOUNTER — Other Ambulatory Visit: Payer: Self-pay | Admitting: Cardiology

## 2015-06-11 ENCOUNTER — Other Ambulatory Visit (INDEPENDENT_AMBULATORY_CARE_PROVIDER_SITE_OTHER): Payer: Medicare Other | Admitting: *Deleted

## 2015-06-11 ENCOUNTER — Other Ambulatory Visit: Payer: Self-pay | Admitting: Cardiology

## 2015-06-11 ENCOUNTER — Other Ambulatory Visit: Payer: Self-pay

## 2015-06-11 DIAGNOSIS — R0602 Shortness of breath: Secondary | ICD-10-CM | POA: Diagnosis not present

## 2015-06-11 LAB — BASIC METABOLIC PANEL
BUN: 31 mg/dL — ABNORMAL HIGH (ref 6–23)
CHLORIDE: 108 meq/L (ref 96–112)
CO2: 26 meq/L (ref 19–32)
CREATININE: 1.4 mg/dL — AB (ref 0.40–1.20)
Calcium: 8.7 mg/dL (ref 8.4–10.5)
GFR: 37.83 mL/min — AB (ref >60.00)
Glucose, Bld: 100 mg/dL — ABNORMAL HIGH (ref 70–99)
POTASSIUM: 4 meq/L (ref 3.5–5.1)
Sodium: 144 mEq/L (ref 135–145)

## 2015-06-11 MED ORDER — FUROSEMIDE 20 MG PO TABS: 40.0000 mg | ORAL_TABLET | Freq: Every day | ORAL | Status: DC

## 2015-06-11 NOTE — Addendum Note (Signed)
Addended by: Eulis Foster on: 06/11/2015 08:14 AM   Modules accepted: Orders

## 2015-06-13 ENCOUNTER — Other Ambulatory Visit: Payer: Self-pay

## 2015-06-13 ENCOUNTER — Encounter: Payer: Self-pay | Admitting: Cardiology

## 2015-06-13 ENCOUNTER — Ambulatory Visit (INDEPENDENT_AMBULATORY_CARE_PROVIDER_SITE_OTHER): Payer: Medicare Other | Admitting: Cardiology

## 2015-06-13 VITALS — BP 134/60 | HR 100 | Ht 62.0 in | Wt 219.8 lb

## 2015-06-13 DIAGNOSIS — I482 Chronic atrial fibrillation, unspecified: Secondary | ICD-10-CM

## 2015-06-13 DIAGNOSIS — I10 Essential (primary) hypertension: Secondary | ICD-10-CM | POA: Diagnosis not present

## 2015-06-13 DIAGNOSIS — Z9889 Other specified postprocedural states: Secondary | ICD-10-CM

## 2015-06-13 DIAGNOSIS — I059 Rheumatic mitral valve disease, unspecified: Secondary | ICD-10-CM

## 2015-06-13 DIAGNOSIS — I5042 Chronic combined systolic (congestive) and diastolic (congestive) heart failure: Secondary | ICD-10-CM | POA: Diagnosis not present

## 2015-06-13 DIAGNOSIS — I951 Orthostatic hypotension: Secondary | ICD-10-CM | POA: Diagnosis not present

## 2015-06-13 LAB — BASIC METABOLIC PANEL
BUN: 39 mg/dL — ABNORMAL HIGH (ref 6–23)
CALCIUM: 9.2 mg/dL (ref 8.4–10.5)
CO2: 27 mEq/L (ref 19–32)
Chloride: 108 mEq/L (ref 96–112)
Creatinine, Ser: 1.51 mg/dL — ABNORMAL HIGH (ref 0.40–1.20)
GFR: 34.67 mL/min — ABNORMAL LOW (ref >60.00)
Glucose, Bld: 99 mg/dL (ref 70–99)
Potassium: 3.7 mEq/L (ref 3.5–5.1)
Sodium: 145 mEq/L (ref 135–145)

## 2015-06-13 MED ORDER — POTASSIUM CHLORIDE CRYS ER 20 MEQ PO TBCR: 20.0000 meq | EXTENDED_RELEASE_TABLET | Freq: Every day | ORAL | Status: DC

## 2015-06-13 MED ORDER — MIDODRINE HCL 2.5 MG PO TABS: ORAL_TABLET | ORAL | Status: DC

## 2015-06-13 MED ORDER — COLCHICINE 0.6 MG PO TABS: 0.6000 mg | ORAL_TABLET | Freq: Every day | ORAL | Status: DC

## 2015-06-13 MED ORDER — DIGOXIN 125 MCG PO TABS: ORAL_TABLET | ORAL | Status: DC

## 2015-06-13 MED ORDER — TORSEMIDE 20 MG PO TABS: 40.0000 mg | ORAL_TABLET | Freq: Every day | ORAL | Status: DC

## 2015-06-13 NOTE — Patient Instructions (Signed)
Medication Instructions:  Your physician has recommended you make the following change in your medication: 1) STOP LASIX  2) CONTINUE DEMADEX 40 mg daily  Labwork: TODAY: BMET, Digoxin  Testing/Procedures: None  Follow-Up: Your physician wants you to follow-up in: 6 months with Dr. Radford Pax. You will receive a reminder letter in the mail two months in advance. If you don't receive a letter, please call our office to schedule the follow-up appointment.   Any Other Special Instructions Will Be Listed Below (If Applicable). Call our office if your weight drops below 215 pounds.

## 2015-06-13 NOTE — Progress Notes (Signed)
Cardiology Office Note   Date:  06/13/2015   ID:  [name], DOB 08-11-1926, MRN [id number]  PCP:  Kandice Hams, MD    Chief Complaint  Patient presents with  . 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 Her dry weight is 220 lbs.  She recently started having volume overload with LE edema and increased abdominal girth.  Her Lasix was increased but she did not respond so she started taking her old dose of Torsemide 40mg  daily for the past 3 days and her weight at home is back down to 119 lbs.  She is now doing well. She denies any chest pain, SOB, DOE, dizziness, palpitations or syncope. Her LE edema has significantly improved.     Past Medical History  Diagnosis Date  . Mitral valve disorder     a. Severe MR s/p repair 2003 (28 mm annuloplasty ring and and oversew of LAA). No CAD by cath at that time.  . Pacemaker     a. 2003: post-op afib after MR repair then symptomatic bradycardia requiring pacemaker. b. Upgrade to Medtronic Bi-V Pacemaker 2007.  [name] Cirrhosis     a. Newly recognized 09/2013 - by CT.  . CKD (chronic kidney disease)   . Pulmonary hypertension, mild     secondary to mild/mod LV dysfunction(EF 40%)  . Diastolic dysfunction   . Spinal stenosis     shes recieved epidural steroid injections in the past  . Chronic combined systolic and diastolic CHF (congestive heart failure)     EF 35-40% on echo 09/2013  . Chronic atrial fibrillation     a. Previously failed DCCV/amio/tikosyn  . NSVT (nonsustained ventricular tachycardia)     a. Isolated event during 09/2007 adm.  . Brady-tachy syndrome     s/p permanent pacemaker placement  .  Hypertension     Past Surgical History  Procedure Laterality Date  . Valve replacement      sever mitral regurgitation s/p mitral valve annuloplasty ring  . Cholecystectomy    . Appendectomy    . Irrigation and debridement abscess N/A 10/14/2013    Procedure: IRRIGATION AND DEBRIDEMENT PERINEAL ABSCESS;  Surgeon: Sherry Bookbinder, MD;  Location: [facility];  Service: General;  Laterality: N/A;  . Biv pacemaker generator change out N/A 12/13/2014    Procedure: BIV PACEMAKER GENERATOR CHANGE OUT;  Surgeon: Evans Lance, MD;  Location: [name];  Service: Cardiovascular;  Laterality: N/A;     Current Outpatient Prescriptions  Medication Sig Dispense Refill  . acetaminophen (TYLENOL) 500 MG tablet Take 500 mg by mouth every 6 (six) hours as needed for mild pain.    . beta carotene w/minerals (OCUVITE) tablet Take 1 tablet by mouth every evening.     . cetirizine (ZYRTEC) 10 MG tablet Take 10 mg by mouth daily as needed for allergies.     [name] colchicine 0.6 MG tablet Take 0.6 mg by mouth daily.     . digoxin (LANOXIN) 0.125 MG tablet TAKE 0.5 TABLETS (0.0625 MG TOTAL) BY MOUTH DAILY. 15 tablet 1  . ferrous sulfate 325 (65 FE) MG tablet  Take 325 mg by mouth daily with supper.     . furosemide (LASIX) 20 MG tablet Take 2 tablets (40 mg total) by mouth daily. 180 tablet 3  . KLOR-CON M20 20 MEQ tablet TAKE 1 TABLET EVERY DAY 30 tablet 1  . midodrine (PROAMATINE) 2.5 MG tablet TAKE 1 TABLET THREE TIMES A DAY WITH MEALS 90 tablet 1  . Multiple Vitamin (MULTIVITAMIN WITH MINERALS) TABS tablet Take 1 tablet by mouth daily.    [name] warfarin (COUMADIN) 5 MG tablet TAKE AS DIRECTED BY ANTICOAGULATION CLINIC 90 tablet 1   No current facility-administered medications for this visit.    Allergies:   Review of patient's allergies indicates no known allergies.    Social History:  The patient  reports that she quit smoking about 43 years ago. She does not have any smokeless tobacco history on file. She  reports that she drinks alcohol. She reports that she does not use illicit drugs.   Family History:  [name] family history includes Other in her mother; Pancreatic cancer in her father.    ROS:  Please see the history of present illness.   Otherwise, review of systems are positive for none.   All other systems are reviewed and negative.    PHYSICAL EXAM: VS:  BP 134/60 mmHg  Pulse 100  Ht 5\' 2"  (1.575 m)  Wt 219 lb 12.8 oz (99.701 kg)  BMI 40.19 kg/m2  SpO2 95%  LMP 10/10/2013 , BMI Body mass index is 40.19 kg/(m^2). GEN: Well nourished, well developed, in no acute distress HEENT: normal Neck: no JVD, carotid bruits, or masses Cardiac: RRR; no murmurs, rubs, or gallops.  Trace edema Respiratory:  clear to auscultation bilaterally, normal work of breathing GI: soft, nontender, nondistended, + BS MS: no deformity or atrophy Skin: warm and dry, no rash Neuro:  Strength and sensation are intact Psych: euthymic mood, full affect   EKG:  EKG was not ordered today.    Recent Labs: 12/06/2014: Hemoglobin 9.1*; Platelets 153.0 06/11/2015: BUN 31*; Creatinine, Ser 1.40*; Potassium 4.0; Sodium 144    Lipid Panel No results found for: CHOL, TRIG, HDL, CHOLHDL, VLDL, LDLCALC, LDLDIRECT    Wt Readings from Last 3 Encounters:  06/13/15 219 lb 12.8 oz (99.701 kg)  03/15/15 223 lb (101.152 kg)  12/13/14 214 lb (97.07 kg)    ASSESSMENT AND PLAN:  1. Chronic combined systolic/diastolic CHF - She does not tolerate beta blockers due to severe orthostasis. She is following a low sodium diet. Her goal dry weight is 220 lbs.  She had a recent episode of volume overload which did not respond to higher doses of Lasix but did respond to Demadex.  She will continue on Demadex 40mg  daily and I have asked her to call me if her weight gets below 215 lbs.  She appears euvolemic on exam today.  Check BMET  2. MV disease s/p MV repair 3. HTN - controlled off BP meds 4. Chronic atrial  fibrillation rate controlled - continue Digoxin/warfarin  - check dig level 5. Tachybrady syndrome s/p PPM with upgrade to BiVPPM  6. Orthostatic hypotension with falls - Will continue midodrine 2.5mg  TID along with TED hose stockings  7. CKD stage 3    Current medicines are reviewed at length with the patient today.  The patient does not have concerns regarding medicines.  The following changes have been made:  no change  Labs/ tests ordered today: See above Assessment and Plan No orders of the defined  types were placed in this encounter.     Disposition:   FU with me in 6 months  Signed, Sueanne Margarita, MD  06/13/2015 8:30 AM    [facility] [place], [place], [place]  [address] Phone: [phone]; Fax: [phone]

## 2015-06-14 ENCOUNTER — Telehealth: Payer: Self-pay | Admitting: Cardiology

## 2015-06-14 DIAGNOSIS — I10 Essential (primary) hypertension: Secondary | ICD-10-CM

## 2015-06-14 LAB — DIGOXIN LEVEL: Digoxin Level: <0.5 ug/L — ABNORMAL LOW (ref 0.8–2.0)

## 2015-06-14 MED ORDER — TORSEMIDE 20 MG PO TABS: 40.0000 mg | ORAL_TABLET | ORAL | Status: DC

## 2015-06-14 NOTE — Telephone Encounter (Signed)
New message ° ° ° ° °Returning Katy's call °

## 2015-06-14 NOTE — Telephone Encounter (Signed)
-----   Message from Sueanne Margarita, MD sent at 06/13/2015  4:09 PM EDT ----- BUN mildly elevated - please have patient change demadex to 40mg  qod alternating with 20mg  qod and recheck BMET in 1 week.  Call if her weight gets > 220 lbs

## 2015-06-14 NOTE — Telephone Encounter (Signed)
Informed patient of results and verbal understanding expressed.  Instructed patient to DECREASE DEMADEX to 40 mg qod alternating with 20 mg qod. Repeat BMET scheduled for next Wednesday. Patient understands to call if her weight exceeds 220 lbs.

## 2015-06-15 ENCOUNTER — Other Ambulatory Visit: Payer: Self-pay | Admitting: *Deleted

## 2015-06-15 MED ORDER — WARFARIN SODIUM 5 MG PO TABS: 5.0000 mg | ORAL_TABLET | ORAL | Status: DC

## 2015-06-18 ENCOUNTER — Other Ambulatory Visit: Payer: Self-pay

## 2015-06-18 NOTE — Telephone Encounter (Signed)
furosemide (LASIX) 20 MG tablet [id number] DISCONTINUED      Order Details    Dose: 40 mg Route: Oral Frequency: Daily   Dispense Quantity:  180 tablet Refills:  3 Fills Remaining:  3          Sig: Take 2 tablets (40 mg total) by mouth daily.         Discontinue Date:  06/13/2015 [place] User:  Theodoro Parma, RN Discontinue Reason:  Discontinued by provider   Written Date:  06/11/15 Expiration Date:  06/10/16     Start Date:  06/11/15 End Date:  06/13/15     Ordering Provider:  Sueanne Margarita, MD Authorizing Provider:  Sueanne Margarita, MD Ordering User:  Stephannie Peters, CMA                    Original Order:  furosemide (LASIX) 20 MG tablet [id number]        Pharmacy:  [id number] - [place], [place] - [address]         Patient Instructions     Medication Instructions:  Your physician has recommended you make the following change in your medication: 1) STOP LASIX  2) CONTINUE DEMADEX 40 mg daily

## 2015-06-19 ENCOUNTER — Other Ambulatory Visit: Payer: Self-pay | Admitting: Cardiology

## 2015-06-20 ENCOUNTER — Other Ambulatory Visit (INDEPENDENT_AMBULATORY_CARE_PROVIDER_SITE_OTHER): Payer: Medicare Other

## 2015-06-20 DIAGNOSIS — I10 Essential (primary) hypertension: Secondary | ICD-10-CM

## 2015-06-20 LAB — BASIC METABOLIC PANEL
BUN: 47 mg/dL — AB (ref 6–23)
CO2: 27 mEq/L (ref 19–32)
Calcium: 8.8 mg/dL (ref 8.4–10.5)
Chloride: 107 mEq/L (ref 96–112)
Creatinine, Ser: 1.54 mg/dL — ABNORMAL HIGH (ref 0.40–1.20)
GFR: 33.89 mL/min — ABNORMAL LOW (ref >60.00)
Glucose, Bld: 103 mg/dL — ABNORMAL HIGH (ref 70–99)
Potassium: 3.9 mEq/L (ref 3.5–5.1)
Sodium: 143 mEq/L (ref 135–145)

## 2015-06-21 ENCOUNTER — Telehealth: Payer: Self-pay

## 2015-06-21 MED ORDER — TORSEMIDE 20 MG PO TABS: 20.0000 mg | ORAL_TABLET | Freq: Every day | ORAL | Status: DC

## 2015-06-21 NOTE — Telephone Encounter (Signed)
Informed patient of results and verbal understanding expressed.  Instructed patient to DECREASE DEMADEX to 20 mg daily. Patient leaving today for [place] for 6 weeks. Per patient request, BMET Rx to be mailed to [place] [address]. Patient understands to have her labs drawn as close to a week as possible.

## 2015-06-21 NOTE — Telephone Encounter (Signed)
-----   Message from Sueanne Margarita, MD sent at 06/20/2015  4:45 PM EDT ----- Creatinine and BUN slightly up - have patient change Demadex to  20mg  daily  and repeat BMET in 1 week

## 2015-06-25 ENCOUNTER — Telehealth: Payer: Self-pay | Admitting: Cardiology

## 2015-06-25 NOTE — Telephone Encounter (Signed)
Informed patient that I did not call her again after we spoke about her Demadex.  Patient grateful for callback.

## 2015-06-25 NOTE — Telephone Encounter (Signed)
New message      Pt says someone called her yesterday.  She is returning the nurses call.

## 2015-06-26 ENCOUNTER — Telehealth: Payer: Self-pay | Admitting: Cardiology

## 2015-06-26 NOTE — Telephone Encounter (Signed)
New message    Pt weight has gone up from 217 to 223  Pt c/o swelling: STAT is pt has developed SOB within 24 hours  1. How long have you been experiencing swelling? Few days  2. Where is the swelling located? Feet and ankles  3.  Are you currently taking a "fluid pill"? Torsemide 20mg  1 time a day  4.  Are you currently SOB? No  5.  Have you traveled recently?Pt is in [place] right now  Please call to discuss

## 2015-06-26 NOTE — Telephone Encounter (Signed)
Await to hear from patient on her weight

## 2015-06-26 NOTE — Telephone Encounter (Signed)
Patient's daughter, Santiago Glad, called concerned about her mom. They made the 2 day drive to [facility] a few days ago, and her mom hurt her knee along the way. When they arrived to [facility], the patient weighed 218 lbs. Today, she weighed 223 lbs.  Informed Santiago Glad I would call her mom and see how she is doing.  Spoke with patient, who is not very concerned about her weight gain. She st she has been swollen while in [place], but not today. The patient st she has been mindful of her salt intake, but it is very hot and humid.  Today, the patient st her legs are not swollen. This is verified by her other daughter who is with her.  She does not have any other complaints except for knee pain and she asked for medication. Informed the patient that Dr. Radford Pax does not typically prescribe pain medications and advised her that if her knee is hurting that badly, to go to an urgent care for assessment. Furthermore, instructed the patient to continue to watch salt intake, to elevate legs, and to call tomorrow with daily weight.  Patient agrees with treatment plan.  To Dr. Radford Pax for her recommendations per patient's daughters' request.

## 2015-06-27 ENCOUNTER — Encounter: Payer: Self-pay | Admitting: *Deleted

## 2015-06-27 NOTE — Telephone Encounter (Signed)
Pt wanted to relay her weight to Dr Radford Pax: 223 llbs

## 2015-06-27 NOTE — Telephone Encounter (Signed)
Instructed patient to INCREASE DEMADEX to 40 mg daily for 2 days then decrease back to 20 mg daily. Patient understands to continue monitoring her weight and swelling and to call back if symptoms worsen.

## 2015-06-27 NOTE — Telephone Encounter (Signed)
Increase Demadex to 40mg  daily for 2 days then go back to 20mg  daily

## 2015-06-27 NOTE — Telephone Encounter (Signed)
Patient stopped Lasix at last office visit and is only taking Demadex 20 mg daily.

## 2015-06-27 NOTE — Telephone Encounter (Signed)
Have her increase Lasix to 40mg  qam and 20mg  qpm for 2 days then back to 2 tablets daily and let us know if weight does not drop

## 2015-07-07 ENCOUNTER — Other Ambulatory Visit: Payer: Self-pay | Admitting: Cardiology

## 2015-07-09 ENCOUNTER — Other Ambulatory Visit: Payer: Self-pay | Admitting: Cardiology

## 2015-07-10 LAB — BASIC METABOLIC PANEL
BUN / CREAT RATIO: 23 (ref 11–26)
BUN: 31 mg/dL — AB (ref 8–27)
CO2: 22 mmol/L (ref 18–29)
Calcium: 8.5 mg/dL — ABNORMAL LOW (ref 8.7–10.3)
Chloride: 105 mmol/L (ref 97–108)
Creatinine, Ser: 1.32 mg/dL — ABNORMAL HIGH (ref 0.57–1.00)
GFR calc Af Amer: 42 mL/min/{1.73_m2} — ABNORMAL LOW (ref >59)
GFR calc non Af Amer: 37 mL/min/{1.73_m2} — ABNORMAL LOW (ref >59)
Glucose: 128 mg/dL — ABNORMAL HIGH (ref 65–99)
Potassium: 4.7 mmol/L (ref 3.5–5.2)
Sodium: 145 mmol/L — ABNORMAL HIGH (ref 134–144)

## 2015-07-10 LAB — PROTIME-INR
INR: 3.1 — ABNORMAL HIGH (ref 0.8–1.2)
Prothrombin Time: 30.8 s — ABNORMAL HIGH (ref 9.1–12.0)

## 2015-07-10 LAB — AMBIG ABBREV BMP8 DEFAULT

## 2015-07-11 ENCOUNTER — Ambulatory Visit (INDEPENDENT_AMBULATORY_CARE_PROVIDER_SITE_OTHER): Payer: Medicare Other | Admitting: Pharmacist

## 2015-07-11 DIAGNOSIS — Z5181 Encounter for therapeutic drug level monitoring: Secondary | ICD-10-CM

## 2015-07-30 ENCOUNTER — Telehealth: Payer: Self-pay | Admitting: Cardiology

## 2015-07-30 NOTE — Telephone Encounter (Signed)
Spoke with pt and she states she is in [place] and needs an order sent to her so she can have INR done in [place] and also asked if could take Alieve for back pain. Pt informed that Trinidad Curet has ASA base and does not interfere with her INR but because she is on Coumadin will increase possibility of GI upset and bleed and that we recommend she take Tylenol and she states understanding and states do not need to forward message to [facility] Dr Theodosia Blender nurse at this time INR order sent to this address to have INR done on September 21st [place] [facility]  [address] [place], [place]

## 2015-07-30 NOTE — Telephone Encounter (Signed)
Encouraged rest and Tylenol for back discomfort. Patient agrees with treatment plan.

## 2015-07-30 NOTE — Telephone Encounter (Signed)
New message  Pt called requests orders sent to [facility] to have coumadin PtINR test taken. Please call back to discuss.   Pt would also like a note sent to [facility] nurse Katy  Pt having back pain. Requests a call back to discuss which medications she can take. Please call back to discuss that as well. Can she take Alieve and asks if will it effect her heart.

## 2015-08-08 ENCOUNTER — Telehealth: Payer: Self-pay | Admitting: Cardiology

## 2015-08-08 NOTE — Telephone Encounter (Signed)
New Message  Pt dtr calling to speak w/ RN- Pt dtr stated she has been admitted to a hospital in [place] for a GI Bleed. Pt dtr wanted to speak w/ RN concerning the hematologist decision to stop her coumadin while being admitted to [facility]. Please call back and discuss.

## 2015-08-08 NOTE — Telephone Encounter (Signed)
Patient's daughter is concerned because patient is in a hospital in [facility] with a GI bleed. When she arrived at the hospital, her INR was 13. Coumadin was stopped and she was given her 4th pint of blood since Sunday today. Patient's daughter understands why coumadin was stopped, but she st the hematologist there told her she should never start again. She is concerned and wants Dr. Theodosia Blender recommendations since she has been taking care of her for years. Instructed patient's daughter to make sure proper paperwork for release of information is signed prior to leaving the hospital so Dr. Radford Pax can see the records to give more informed recommendations about coumadin.  Patient's daughter grateful for call.

## 2015-08-14 ENCOUNTER — Emergency Department (HOSPITAL_COMMUNITY): Payer: Medicare Other

## 2015-08-14 ENCOUNTER — Observation Stay (HOSPITAL_COMMUNITY)
Admission: EM | Admit: 2015-08-14 | Discharge: 2015-08-18 | Disposition: A | Discharge status: Home / Self Care | Payer: Medicare Other | Attending: Cardiology | Admitting: Cardiology

## 2015-08-14 ENCOUNTER — Encounter (HOSPITAL_COMMUNITY): Payer: Self-pay | Admitting: Emergency Medicine

## 2015-08-14 DIAGNOSIS — N184 Chronic kidney disease, stage 4 (severe): Secondary | ICD-10-CM | POA: Diagnosis present

## 2015-08-14 DIAGNOSIS — D638 Anemia in other chronic diseases classified elsewhere: Secondary | ICD-10-CM | POA: Diagnosis not present

## 2015-08-14 DIAGNOSIS — Z950 Presence of cardiac pacemaker: Secondary | ICD-10-CM | POA: Insufficient documentation

## 2015-08-14 DIAGNOSIS — I5042 Chronic combined systolic (congestive) and diastolic (congestive) heart failure: Secondary | ICD-10-CM | POA: Diagnosis present

## 2015-08-14 DIAGNOSIS — E876 Hypokalemia: Secondary | ICD-10-CM | POA: Insufficient documentation

## 2015-08-14 DIAGNOSIS — I951 Orthostatic hypotension: Secondary | ICD-10-CM | POA: Insufficient documentation

## 2015-08-14 DIAGNOSIS — L03116 Cellulitis of left lower limb: Secondary | ICD-10-CM

## 2015-08-14 DIAGNOSIS — K7460 Unspecified cirrhosis of liver: Secondary | ICD-10-CM | POA: Diagnosis not present

## 2015-08-14 DIAGNOSIS — E0781 Sick-euthyroid syndrome: Secondary | ICD-10-CM | POA: Insufficient documentation

## 2015-08-14 DIAGNOSIS — I5043 Acute on chronic combined systolic (congestive) and diastolic (congestive) heart failure: Principal | ICD-10-CM | POA: Diagnosis present

## 2015-08-14 DIAGNOSIS — L03115 Cellulitis of right lower limb: Secondary | ICD-10-CM | POA: Insufficient documentation

## 2015-08-14 DIAGNOSIS — I4729 Other ventricular tachycardia: Secondary | ICD-10-CM | POA: Insufficient documentation

## 2015-08-14 DIAGNOSIS — Z8739 Personal history of other diseases of the musculoskeletal system and connective tissue: Secondary | ICD-10-CM

## 2015-08-14 DIAGNOSIS — Z7901 Long term (current) use of anticoagulants: Secondary | ICD-10-CM | POA: Diagnosis not present

## 2015-08-14 DIAGNOSIS — N183 Chronic kidney disease, stage 3 (moderate): Secondary | ICD-10-CM | POA: Diagnosis not present

## 2015-08-14 DIAGNOSIS — I10 Essential (primary) hypertension: Secondary | ICD-10-CM | POA: Diagnosis present

## 2015-08-14 DIAGNOSIS — R946 Abnormal results of thyroid function studies: Secondary | ICD-10-CM | POA: Diagnosis present

## 2015-08-14 DIAGNOSIS — Z87891 Personal history of nicotine dependence: Secondary | ICD-10-CM | POA: Diagnosis not present

## 2015-08-14 DIAGNOSIS — M10071 Idiopathic gout, right ankle and foot: Secondary | ICD-10-CM | POA: Diagnosis not present

## 2015-08-14 DIAGNOSIS — M25579 Pain in unspecified ankle and joints of unspecified foot: Secondary | ICD-10-CM

## 2015-08-14 DIAGNOSIS — Z952 Presence of prosthetic heart valve: Secondary | ICD-10-CM | POA: Insufficient documentation

## 2015-08-14 DIAGNOSIS — D649 Anemia, unspecified: Secondary | ICD-10-CM | POA: Diagnosis present

## 2015-08-14 DIAGNOSIS — I472 Ventricular tachycardia: Secondary | ICD-10-CM | POA: Insufficient documentation

## 2015-08-14 DIAGNOSIS — M109 Gout, unspecified: Secondary | ICD-10-CM

## 2015-08-14 DIAGNOSIS — I129 Hypertensive chronic kidney disease with stage 1 through stage 4 chronic kidney disease, or unspecified chronic kidney disease: Secondary | ICD-10-CM | POA: Insufficient documentation

## 2015-08-14 DIAGNOSIS — R600 Localized edema: Secondary | ICD-10-CM

## 2015-08-14 DIAGNOSIS — R609 Edema, unspecified: Secondary | ICD-10-CM

## 2015-08-14 DIAGNOSIS — I482 Chronic atrial fibrillation: Secondary | ICD-10-CM | POA: Insufficient documentation

## 2015-08-14 DIAGNOSIS — M25571 Pain in right ankle and joints of right foot: Secondary | ICD-10-CM | POA: Diagnosis present

## 2015-08-14 DIAGNOSIS — I4821 Permanent atrial fibrillation: Secondary | ICD-10-CM | POA: Diagnosis present

## 2015-08-14 HISTORY — DX: Gastrointestinal hemorrhage, unspecified: K92.2

## 2015-08-14 HISTORY — DX: Cellulitis of unspecified part of limb: L03.119

## 2015-08-14 HISTORY — DX: Cutaneous abscess of unspecified foot: L02.619

## 2015-08-14 LAB — BASIC METABOLIC PANEL
Anion gap: 7 (ref 5–15)
BUN: 28 mg/dL — ABNORMAL HIGH (ref 6–20)
CHLORIDE: 111 mmol/L (ref 101–111)
CO2: 24 mmol/L (ref 22–32)
Calcium: 8.4 mg/dL — ABNORMAL LOW (ref 8.9–10.3)
Creatinine, Ser: 1.83 mg/dL — ABNORMAL HIGH (ref 0.44–1.00)
GFR calc non Af Amer: 24 mL/min — ABNORMAL LOW (ref >60)
GFR, EST AFRICAN AMERICAN: 28 mL/min — AB (ref >60)
Glucose, Bld: 130 mg/dL — ABNORMAL HIGH (ref 65–99)
POTASSIUM: 3.8 mmol/L (ref 3.5–5.1)
SODIUM: 142 mmol/L (ref 135–145)

## 2015-08-14 LAB — PROTIME-INR
INR: 1.58 — ABNORMAL HIGH (ref 0.00–1.49)
Prothrombin Time: 18.9 seconds — ABNORMAL HIGH (ref 11.6–15.2)

## 2015-08-14 LAB — CBC
HEMATOCRIT: 30.6 % — AB (ref 36.0–46.0)
HEMOGLOBIN: 9 g/dL — AB (ref 12.0–15.0)
MCH: 26.2 pg (ref 26.0–34.0)
MCHC: 29.4 g/dL — ABNORMAL LOW (ref 30.0–36.0)
MCV: 89 fL (ref 78.0–100.0)
Platelets: 118 10*3/uL — ABNORMAL LOW (ref 150–400)
RBC: 3.44 MIL/uL — AB (ref 3.87–5.11)
RDW: 22.2 % — ABNORMAL HIGH (ref 11.5–15.5)
WBC: 6 10*3/uL (ref 4.0–10.5)

## 2015-08-14 LAB — I-STAT TROPONIN, ED: TROPONIN I, POC: 0.04 ng/mL (ref 0.00–0.08)

## 2015-08-14 LAB — BRAIN NATRIURETIC PEPTIDE: B NATRIURETIC PEPTIDE 5: 391.3 pg/mL — AB (ref 0.0–100.0)

## 2015-08-14 MED ORDER — VANCOMYCIN HCL 10 G IV SOLR
1500.0000 mg | Freq: Once | INTRAVENOUS | Status: AC
  Administered 2015-08-15: 1500 mg via INTRAVENOUS
  Filled 2015-08-14: qty 1500

## 2015-08-14 NOTE — ED Notes (Signed)
Family noticed pt.'s lower legs edema/swelling , abdominal swelling and mild SOB onset today , hospitalized last week at [place] for GI bleeding , history of CHF her cardiologist is Dr. Golden Hurter , denies chest pain or chest discomfort / no fever or chills.

## 2015-08-14 NOTE — ED Provider Notes (Signed)
CSN: [id number]     Arrival date & time 08/14/15  2144 History   By signing my name below, I, Forrestine Him, attest that this documentation has been prepared under the direction and in the presence of Everlene Balls, MD. Electronically Signed: Forrestine Him, ED Scribe. 08/14/2015. 11:38 PM.   Chief Complaint  Patient presents with  . Leg Swelling   The history is provided by the patient. No language interpreter was used.    HPI Comments: [name] is a 81 y.o. female with a PMHx of mitral valve disorder, CKD, A-Fib, CHF, and HTN who presents to the Emergency Department complaining of constant, ongoing bilateral lower extremity swelling with R sided mild redness and pain x 2 days. Ongoing generalized weakness and mild shortness of breath also reported. No aggravating or alleviating factors at this time. Pt was recently released from a hospital in [place] yesterday after a fall and GI bleed. At time of visit, pt was admitted for a hemoglobin of 3 and a PT-INR of 13. During her stay she was taken off of all of her medications. Denies any recent fever, chills, chest pain, nausea, or vomiting.  She is followed by Dr. [name] at [facility]  Past Medical History  Diagnosis Date  . Mitral valve disorder     a. Severe MR s/p repair 2003 (28 mm annuloplasty ring and and oversew of LAA). No CAD by cath at that time.  . Pacemaker     a. 2003: post-op afib after MR repair then symptomatic bradycardia requiring pacemaker. b. Upgrade to Medtronic Bi-V Pacemaker 2007.  [name] Cirrhosis     a. Newly recognized 09/2013 - by CT.  . CKD (chronic kidney disease)   . Pulmonary hypertension, mild     secondary to mild/mod LV dysfunction(EF 40%)  . Diastolic dysfunction   . Spinal stenosis     shes recieved epidural steroid injections in the past  . Chronic combined systolic and diastolic CHF (congestive heart failure)     EF 35-40% on echo 09/2013  . Chronic atrial fibrillation     a. Previously failed  DCCV/amio/tikosyn  . NSVT (nonsustained ventricular tachycardia)     a. Isolated event during 09/2007 adm.  . Brady-tachy syndrome     s/p permanent pacemaker placement  . Hypertension   . GI bleeding    Past Surgical History  Procedure Laterality Date  . Valve replacement      sever mitral regurgitation s/p mitral valve annuloplasty ring  . Cholecystectomy    . Appendectomy    . Irrigation and debridement abscess N/A 10/14/2013    Procedure: IRRIGATION AND DEBRIDEMENT PERINEAL ABSCESS;  Surgeon: Rolm Bookbinder, MD;  Location: [facility];  Service: General;  Laterality: N/A;  . Biv pacemaker generator change out N/A 12/13/2014    Procedure: BIV PACEMAKER GENERATOR CHANGE OUT;  Surgeon: Evans Lance, MD;  Location: [facility] CATH LAB;  Service: Cardiovascular;  Laterality: N/A;   Family History  Problem Relation Age of Onset  . Pancreatic cancer Father   . Other Mother     Mother died at 28 with no real medical problems   Social History  Substance Use Topics  . Smoking status: Former Smoker    Quit date: 10/11/1971  . Smokeless tobacco: None  . Alcohol Use: 0.0 oz/week     Comment: rare   OB History    No data available     Review of Systems  Constitutional: Negative for fever and chills.  HENT: Negative for congestion.   Respiratory: Positive for shortness of breath. Negative for cough.   Cardiovascular: Positive for leg swelling. Negative for chest pain.  Gastrointestinal: Negative for nausea, vomiting and abdominal pain.  Genitourinary: Negative for dysuria.  Musculoskeletal: Positive for arthralgias.  Skin: Positive for color change. Negative for rash.  Neurological: Negative for weakness, numbness and headaches.  Psychiatric/Behavioral: Negative for confusion.  All other systems reviewed and are negative.     Allergies  Review of patient's allergies indicates no known allergies.  Home Medications   Prior to Admission medications   Medication Sig Start Date End  Date Taking? Authorizing Provider  acetaminophen (TYLENOL) 500 MG tablet Take 500 mg by mouth every 6 (six) hours as needed for mild pain.    Historical Provider, MD  beta carotene w/minerals (OCUVITE) tablet Take 1 tablet by mouth every evening.     Historical Provider, MD  cetirizine (ZYRTEC) 10 MG tablet Take 10 mg by mouth daily as needed for allergies.     Historical Provider, MD  colchicine 0.6 MG tablet Take 1 tablet (0.6 mg total) by mouth daily. 06/13/15   Sueanne Margarita, MD  digoxin (LANOXIN) 0.125 MG tablet TAKE 0.5 TABLETS (0.0625 MG TOTAL) BY MOUTH DAILY. 06/13/15   Sueanne Margarita, MD  ferrous sulfate 325 (65 FE) MG tablet Take 325 mg by mouth daily with supper.     Historical Provider, MD  KLOR-CON M20 20 MEQ tablet TAKE 1 TABLET EVERY DAY 06/20/15   Sueanne Margarita, MD  midodrine (PROAMATINE) 2.5 MG tablet TAKE 1 TABLET THREE TIMES A DAY WITH MEALS 07/09/15   Sueanne Margarita, MD  Multiple Vitamin (MULTIVITAMIN WITH MINERALS) TABS tablet Take 1 tablet by mouth daily.    Historical Provider, MD  torsemide (DEMADEX) 20 MG tablet Take 1 tablet (20 mg total) by mouth daily. 06/21/15   Sueanne Margarita, MD  warfarin (COUMADIN) 5 MG tablet Take 1 tablet (5 mg total) by mouth as directed. 06/15/15   Sueanne Margarita, MD   Triage Vitals: BP 100/43 mmHg  Pulse 71  Temp(Src) 98.1 F (36.7 C) (Oral)  Resp 18  Ht 5\' 2"  (1.575 m)  Wt 223 lb (101.152 kg)  BMI 40.78 kg/m2  SpO2 96%  LMP 10/10/2013   Physical Exam  Constitutional: She is oriented to person, place, and time. She appears well-developed and well-nourished. No distress.  HENT:  Head: Normocephalic and atraumatic.  Nose: Nose normal.  Mouth/Throat: Oropharynx is clear and moist. No oropharyngeal exudate.  Eyes: Conjunctivae and EOM are normal. Pupils are equal, round, and reactive to light. No scleral icterus.  Neck: Normal range of motion. Neck supple. No JVD present. No tracheal deviation present. No thyromegaly present.   Cardiovascular: Normal rate, regular rhythm and normal heart sounds.  Exam reveals no gallop and no friction rub.   No murmur heard. Pulmonary/Chest: Effort normal and breath sounds normal. No respiratory distress. She has no wheezes. She exhibits no tenderness.  Abdominal: Soft. Bowel sounds are normal. She exhibits distension. She exhibits no mass. There is no tenderness. There is no rebound and no guarding.  Musculoskeletal: Normal range of motion. She exhibits no edema or tenderness.  Bilateral lower extremity edema Warmth, mild redness, and tenderness to the R lower extremity   Lymphadenopathy:    She has no cervical adenopathy.  Neurological: She is alert and oriented to person, place, and time. No cranial nerve deficit. She exhibits normal muscle tone.  Skin: Skin  is warm and dry. No rash noted. No erythema. No pallor.  Nursing note and vitals reviewed.   ED Course  Procedures (including critical care time)  DIAGNOSTIC STUDIES: Oxygen Saturation is 96% on RA, adequate by my interpretation.    COORDINATION OF CARE: 11:53 PM- Will order CXR, i-stat troponin, BNP, BMP, CBC, PT-INR, and EKG. Discussed treatment plan with pt at bedside and pt agreed to plan.     Labs Review Labs Reviewed  BASIC METABOLIC PANEL - Abnormal; Notable for the following:    Glucose, Bld 130 (*)    BUN 28 (*)    Creatinine, Ser 1.83 (*)    Calcium 8.4 (*)    GFR calc non Af Amer 24 (*)    GFR calc Af Amer 28 (*)    All other components within normal limits  CBC - Abnormal; Notable for the following:    RBC 3.44 (*)    Hemoglobin 9.0 (*)    HCT 30.6 (*)    MCHC 29.4 (*)    RDW 22.2 (*)    Platelets 118 (*)    All other components within normal limits  PROTIME-INR - Abnormal; Notable for the following:    Prothrombin Time 18.9 (*)    INR 1.58 (*)    All other components within normal limits  BRAIN NATRIURETIC PEPTIDE - Abnormal; Notable for the following:    B Natriuretic Peptide 391.3 (*)     All other components within normal limits  CULTURE, BLOOD (ROUTINE X 2)  CULTURE, BLOOD (ROUTINE X 2)  I-STAT TROPOININ, ED    Imaging Review Dg Chest 2 View  08/14/2015   CLINICAL DATA:  Shortness of breath  EXAM: CHEST  2 VIEW  COMPARISON:  February 28, 2014  FINDINGS: The heart size and mediastinal contours are stable. Heart size is enlarged. Cardiac pacemaker is identified. Cardiac valvular replacement ring is unchanged. There is a small left pleural effusion. There is no pulmonary edema or focal pneumonia. The visualized skeletal structures are stable.  IMPRESSION: Small left pleural effusion.   Electronically Signed   By: Abelardo Diesel M.D.   On: 08/14/2015 23:13   I have personally reviewed and evaluated these images and lab results as part of my medical decision-making.   EKG Interpretation   Date/Time:  Wednesday August 15 2015 00:12:35 EDT Ventricular Rate:  70 PR Interval:  151 QRS Duration: 161 QT Interval:  448 QTC Calculation: 483 R Axis:   -75 Text Interpretation:  Ventricular-paced complexes No significant change  since last tracing Artifact Confirmed by Glynn Octave [phone]) on  08/15/2015 12:24:34 AM      MDM   Final diagnoses:  None   Patient presents to the emergency department for CHF exacerbation. Her symptoms are consistent with this.  INR is 1.58, she states that she has stopped taking her warfarin due to recent GI bleed. I spoke with Dr. Claiborne Billings with cardiology who will admit the patient for further care. She was given 80 mg of IV Lasix in the emergency department. Also obtain blood cultures for left lower extremity redness and pain, given dose of vancomycin as well.   I personally performed the services described in this documentation, which was scribed in my presence. The recorded information has been reviewed and is accurate.   Everlene Balls, MD 08/15/15 [phone]

## 2015-08-15 ENCOUNTER — Observation Stay (HOSPITAL_BASED_OUTPATIENT_CLINIC_OR_DEPARTMENT_OTHER): Payer: Medicare Other

## 2015-08-15 ENCOUNTER — Observation Stay (HOSPITAL_COMMUNITY): Payer: Medicare Other

## 2015-08-15 ENCOUNTER — Encounter (HOSPITAL_COMMUNITY): Payer: Self-pay | Admitting: General Practice

## 2015-08-15 DIAGNOSIS — L02619 Cutaneous abscess of unspecified foot: Secondary | ICD-10-CM

## 2015-08-15 DIAGNOSIS — I10 Essential (primary) hypertension: Secondary | ICD-10-CM

## 2015-08-15 DIAGNOSIS — Z8739 Personal history of other diseases of the musculoskeletal system and connective tissue: Secondary | ICD-10-CM

## 2015-08-15 DIAGNOSIS — I5043 Acute on chronic combined systolic (congestive) and diastolic (congestive) heart failure: Secondary | ICD-10-CM

## 2015-08-15 DIAGNOSIS — M25571 Pain in right ankle and joints of right foot: Secondary | ICD-10-CM | POA: Diagnosis present

## 2015-08-15 DIAGNOSIS — I509 Heart failure, unspecified: Secondary | ICD-10-CM | POA: Diagnosis not present

## 2015-08-15 DIAGNOSIS — L03119 Cellulitis of unspecified part of limb: Secondary | ICD-10-CM

## 2015-08-15 DIAGNOSIS — Z7901 Long term (current) use of anticoagulants: Secondary | ICD-10-CM

## 2015-08-15 DIAGNOSIS — R946 Abnormal results of thyroid function studies: Secondary | ICD-10-CM | POA: Diagnosis present

## 2015-08-15 DIAGNOSIS — I482 Chronic atrial fibrillation: Secondary | ICD-10-CM | POA: Diagnosis not present

## 2015-08-15 DIAGNOSIS — M25579 Pain in unspecified ankle and joints of unspecified foot: Secondary | ICD-10-CM | POA: Insufficient documentation

## 2015-08-15 DIAGNOSIS — I951 Orthostatic hypotension: Secondary | ICD-10-CM | POA: Diagnosis not present

## 2015-08-15 HISTORY — DX: Cellulitis of unspecified part of limb: L03.119

## 2015-08-15 HISTORY — DX: Cutaneous abscess of unspecified foot: L02.619

## 2015-08-15 HISTORY — DX: Personal history of other diseases of the musculoskeletal system and connective tissue: Z87.39

## 2015-08-15 LAB — TROPONIN I
TROPONIN I: 0.03 ng/mL (ref <0.031)
Troponin I: 0.04 ng/mL — ABNORMAL HIGH (ref <0.031)
Troponin I: 0.04 ng/mL — ABNORMAL HIGH (ref <0.031)

## 2015-08-15 LAB — TSH: TSH: 5.202 u[IU]/mL — ABNORMAL HIGH (ref 0.350–4.500)

## 2015-08-15 LAB — MAGNESIUM: MAGNESIUM: 1.9 mg/dL (ref 1.7–2.4)

## 2015-08-15 LAB — T4, FREE: Free T4: 1.15 ng/dL — ABNORMAL HIGH (ref 0.61–1.12)

## 2015-08-15 LAB — URIC ACID: Uric Acid, Serum: 13.5 mg/dL — ABNORMAL HIGH (ref 2.3–6.6)

## 2015-08-15 LAB — SEDIMENTATION RATE: Sed Rate: 63 mm/hr — ABNORMAL HIGH (ref 0–22)

## 2015-08-15 LAB — C-REACTIVE PROTEIN: CRP: 7.1 mg/dL — AB (ref <1.0)

## 2015-08-15 MED ORDER — PANTOPRAZOLE SODIUM 40 MG PO TBEC
40.0000 mg | DELAYED_RELEASE_TABLET | Freq: Every day | ORAL | Status: DC
  Administered 2015-08-15 – 2015-08-18 (×4): 40 mg via ORAL
  Filled 2015-08-15 (×5): qty 1

## 2015-08-15 MED ORDER — VANCOMYCIN HCL 10 G IV SOLR: 1500.0000 mg | INTRAVENOUS | Status: DC

## 2015-08-15 MED ORDER — SODIUM CHLORIDE 0.9 % IJ SOLN
3.0000 mL | Freq: Two times a day (BID) | INTRAMUSCULAR | Status: DC
  Administered 2015-08-15 – 2015-08-18 (×7): 3 mL via INTRAVENOUS

## 2015-08-15 MED ORDER — ONDANSETRON HCL 4 MG/2ML IJ SOLN: 4.0000 mg | Freq: Four times a day (QID) | INTRAMUSCULAR | Status: DC | PRN

## 2015-08-15 MED ORDER — SODIUM CHLORIDE 0.9 % IJ SOLN: 3.0000 mL | INTRAMUSCULAR | Status: DC | PRN

## 2015-08-15 MED ORDER — FUROSEMIDE 10 MG/ML IJ SOLN
60.0000 mg | Freq: Two times a day (BID) | INTRAMUSCULAR | Status: DC
  Administered 2015-08-15 – 2015-08-17 (×5): 60 mg via INTRAVENOUS
  Filled 2015-08-15 (×4): qty 6

## 2015-08-15 MED ORDER — LORATADINE 10 MG PO TABS
10.0000 mg | ORAL_TABLET | Freq: Every day | ORAL | Status: DC
  Administered 2015-08-15 – 2015-08-18 (×4): 10 mg via ORAL
  Filled 2015-08-15 (×4): qty 1

## 2015-08-15 MED ORDER — MIDODRINE HCL 5 MG PO TABS
2.5000 mg | ORAL_TABLET | Freq: Three times a day (TID) | ORAL | Status: DC
  Administered 2015-08-15 – 2015-08-16 (×6): 2.5 mg via ORAL
  Filled 2015-08-15 (×6): qty 1

## 2015-08-15 MED ORDER — FUROSEMIDE 10 MG/ML IJ SOLN
80.0000 mg | Freq: Once | INTRAMUSCULAR | Status: AC
  Administered 2015-08-15: 80 mg via INTRAVENOUS
  Filled 2015-08-15: qty 8

## 2015-08-15 MED ORDER — ACETAMINOPHEN 325 MG PO TABS
650.0000 mg | ORAL_TABLET | ORAL | Status: DC | PRN
  Administered 2015-08-15 – 2015-08-18 (×4): 650 mg via ORAL
  Filled 2015-08-15 (×4): qty 2

## 2015-08-15 MED ORDER — SODIUM CHLORIDE 0.9 % IV SOLN: 250.0000 mL | INTRAVENOUS | Status: DC | PRN

## 2015-08-15 MED ORDER — OCUVITE PO TABS
1.0000 | ORAL_TABLET | Freq: Every evening | ORAL | Status: DC
  Administered 2015-08-15 – 2015-08-17 (×3): 1 via ORAL
  Filled 2015-08-15 (×4): qty 1

## 2015-08-15 MED ORDER — ADULT MULTIVITAMIN W/MINERALS CH
1.0000 | ORAL_TABLET | Freq: Every day | ORAL | Status: DC
  Administered 2015-08-15 – 2015-08-17 (×3): 1 via ORAL
  Filled 2015-08-15 (×4): qty 1

## 2015-08-15 MED ORDER — DIGOXIN 125 MCG PO TABS
0.0625 mg | ORAL_TABLET | Freq: Every day | ORAL | Status: DC
  Administered 2015-08-15 – 2015-08-18 (×4): 0.0625 mg via ORAL
  Filled 2015-08-15 (×4): qty 1

## 2015-08-15 NOTE — Progress Notes (Addendum)
Patient Name: [name] Date of Encounter: 08/15/2015  Pt. Profile: [name] is an 81 yo woman with PMH of systolic and diastolic heart failure, BIV pacemaker for tachybrady syndrome, severe MR with previous MV repair, stage III chronic kidney disease, chronic atrial fibrillation on chronic anticoagulation with warfarin, moderate pulmonary hypertension and hypertension who presents with worsening heart failure symptoms and weight gain. Last known dry weight 220 lbs. She's recently been hospitalized in [place] (last week) and had a GI bleed and a fall with hemoglobin as low as 3 and INr of 13.  SUBJECTIVE  Breathing improved, however not at baseline. No CP. Complains of R ankle pain, pain started after leaving hospital. She fly over here 2 days ago. She does have hx of gout, however this pain is different.   CURRENT MEDS . beta carotene w/minerals  1 tablet Oral QPM  . digoxin  0.0625 mg Oral Daily  . furosemide  60 mg Intravenous BID  . loratadine  10 mg Oral Daily  . midodrine  2.5 mg Oral TID WC  . multivitamin with minerals  1 tablet Oral Daily  . pantoprazole  40 mg Oral Daily  . sodium chloride  3 mL Intravenous Q12H  . [START ON 08/17/2015] vancomycin  1,500 mg Intravenous Q48H    OBJECTIVE  Filed Vitals:   08/15/15 0130 08/15/15 0207 08/15/15 0428 08/15/15 0824  BP: 107/37 110/35 107/38 107/34  Pulse: 70 70 73 71  Temp:  98.3 F (36.8 C) 97.7 F (36.5 C) 97.4 F (36.3 C)  TempSrc:  Oral Oral Oral  Resp: 24 21 18 18   Height:  5\' 2"  (1.575 m)    Weight:  223 lb 3.2 oz (101.243 kg)    SpO2: 98% 100% 99% 99%    Intake/Output Summary (Last 24 hours) at 08/15/15 1129 Last data filed at 08/15/15 1010  Gross per 24 hour  Intake    415 ml  Output    945 ml  Net   -530 ml   Filed Weights   08/14/15 2225 08/15/15 0207  Weight: 223 lb (101.152 kg) 223 lb 3.2 oz (101.243 kg)    PHYSICAL EXAM  General: Pleasant, NAD. Neuro: Alert and oriented X 3. Moves all  extremities spontaneously. Psych: Normal affect. HEENT:  Normal  Neck: Supple without bruits or +JVD. Lungs:  Resp regular and unlabored. Bibasilar rales with diminished breath sounds Heart: RRR no s3, s4. Systolic murmurs. Abdomen: Soft, non-tender, non-distended, BS + x 4.  Extremities: No clubbing, cyanosis. 2+ BL LE edema. DP/PT/Radials 2+ and equal bilaterally. SKIN: Right ankle tender and warm to touch with erythema.  Accessory Clinical Findings  CBC  Recent Labs  08/14/15 2256  WBC 6.0  HGB 9.0*  HCT 30.6*  MCV 89.0  PLT 123456*   Basic Metabolic Panel  Recent Labs  08/14/15 2256 08/15/15 0240  NA 142  --   K 3.8  --   CL 111  --   CO2 24  --   GLUCOSE 130*  --   BUN 28*  --   CREATININE 1.83*  --   CALCIUM 8.4*  --   MG  --  1.9   Cardiac Enzymes  Recent Labs  08/15/15 0240 08/15/15 0828  TROPONINI 0.04* 0.04*   Thyroid Function Tests  Recent Labs  08/15/15 0240  TSH 5.202*    TELE  V paced  Radiology/Studies  Dg Chest 2 View  08/14/2015   CLINICAL DATA:  Shortness of breath  EXAM: CHEST  2 VIEW  COMPARISON:  February 28, 2014  FINDINGS: The heart size and mediastinal contours are stable. Heart size is enlarged. Cardiac pacemaker is identified. Cardiac valvular replacement ring is unchanged. There is a small left pleural effusion. There is no pulmonary edema or focal pneumonia. The visualized skeletal structures are stable.  IMPRESSION: Small left pleural effusion.   Electronically Signed   By: Abelardo Diesel M.D.   On: 08/14/2015 23:13   Echo 09/2013 LV EF: 35% -  40%  ------------------------------------------------------------ Indications:   CHF - 428.0.  ------------------------------------------------------------ History:  Risk factors: Liver cirrhosis. Thrombocytopenia. Nausea / vomiting. UTI. CKD stage III. Anemia. Former tobacco use.  ------------------------------------------------------------ Study Conclusions  - Left  ventricle: The cavity size was mildly dilated. Systolic function was moderately reduced. The estimated ejection fraction was in the range of 35% to 40%. Challenging to estimate given poor windows. There is hypokinesis of the anteroseptal myocardium. - Mitral valve: Moderately calcified annulus. Mildly thickened leaflets . Mild regurgitation. - Left atrium: The atrium was moderately dilated. - Right ventricle: The cavity size was mildly dilated. Wall thickness was normal. - Right atrium: The atrium was mildly dilated. - Pulmonary arteries: PA peak pressure: 68mm Hg (S).  ASSESSMENT AND PLAN [name] is an 81 yo woman with PMH of systolic and diastolic heart failure, BIV pacemaker for tachybrady syndrome, severe MR with previous MV repair, stage III chronic kidney disease, chronic atrial fibrillation on chronic anticoagulation with warfarin, moderate pulmonary hypertension and hypertension who presents with worsening heart failure symptoms and weight gain. Last known dry weight 220 lbs. She's recently been hospitalized in [place] (last week) and had a GI bleed and a fall with hemoglobin as low as 3 and INR of 13.  1. Acute on chronic systolic and diastolic heart failure, NYHA class 3 - given Iv lasix 80mg  once, now on 60mg  BID. Diuresed 627ml. Weight same 223lb. Last know dry weight is 220lb. She is on Demadex 20mg  daily at home.  - BNP of 391.3. Creatinine 1.83. Follow closely with diuresis.  - continue IV diuresis. Daily weight and stick I&O.  - She does not tolerate beta blockers due to severe orthostatics - last 2014 echo with EF of 35-40%, PA peak pressure: 87mm Hg (S), mild mitral reguar. Will get echo.   2. Acute on CKD (chronic kidney disease) stage 3, GFR 30-59 ml/min - Seems baseline 1.3 to 1.5 - follow closely with diuresis  3. MV disease s/p MV repair - as above  3. HTN  - Relatively stable wit low DBP  4. Chronic atrial fibrillation rate controlled -  Warfarin stopped due to recent GI bleed with Hgb of 3 and INR of 13. - CHADSVASC score of 5 (age, sex, CHF, HTN) - continue Digoxin for rate control  5. Tachybrady syndrome s/p PPM with upgrade to BiVPPM  - V paced rhythm - will get device interrogation (last device check 2 months ago)  6. Orthostatic hypotension with falls -Continue midodrine   7. Right lower extremity cellulitis -Given Iv Vancomycin in ED - now schedule after 48 hours pending blood culture - differential include gout, however this pain is different from previous gout pain vs injury due to fall, however pain started after leaving hospital (consider xray) vs DVT with recent travel and hospitalization - Will get LE doppler and IM consult  8. High TSH - Will get free T4   9. Anemia - Hgb improved to 9.   Signed, Leanor Kail PA-C Pager  [phone]  Agree with note by Robbie Lis PA-C  Admitted with CHF. BNP mildly elevated. 1-2+ edema with some superimposed RLLE cellulitis. SCr increased 1.3--->>1.8. Getting IV diuresis. H/O PTVPM. No edema on CXR but CE. Will re check 2D, interrogate PM, contin to diurese. Venous duplex. Coumadin D/Cd secondary to recent admission for coagulopathy with life threatening bleed.    Lorretta Harp, M.D., [place], [facility], [place] [address]. [place], [place]  [address]  [phone] 08/15/2015 12:07 PM

## 2015-08-15 NOTE — H&P (Signed)
[name] is an 81 y.o. female.    Chief Complaint: shortness of breath Primary Cardiologist: Turner HPI: [name] is an 81 yo woman with PMH of systolic and diastolic heart failure, BIV pacemaker for tachybrady syndrome, severe MR with previous MV repair, stage III chronic kidney disease, chronic atrial fibrillation on chronic anticoagulation with warfarin, moderate pulmonary hypertension and hypertension who presents with worsening heart failure symptoms with every day activities as well as 7 lb weight gain. She's recently been hospitalized in [place] and had a GI bleed and a fall with hemoglobin as low as 3 and INr of 13. She had all of her medications stopped. Otherwise, she denies fever/chills/nausea/vomiting/diarrhea.   She was last seen by Dr. Radford Pax July 2016.    She has leg edema, more dyspnea with exertion, bending over and walking any distances. She is still taking digoxin and midodrine and torsemide but she is off metoprolol and warfarin given fall and GI bleed. She denies current frank syncope or chest pain. No infectious symptoms but some itching and right leg redness.   Past Medical History  Diagnosis Date  . Mitral valve disorder     a. Severe MR s/p repair 2003 (28 mm annuloplasty ring and and oversew of LAA). No CAD by cath at that time.  . Pacemaker     a. 2003: post-op afib after MR repair then symptomatic bradycardia requiring pacemaker. b. Upgrade to Medtronic Bi-V Pacemaker 2007.  [name] Cirrhosis     a. Newly recognized 09/2013 - by CT.  . CKD (chronic kidney disease)   . Pulmonary hypertension, mild     secondary to mild/mod LV dysfunction(EF 40%)  . Diastolic dysfunction   . Spinal stenosis     shes recieved epidural steroid injections in the past  . Chronic combined systolic and diastolic CHF (congestive heart failure)     EF 35-40% on echo 09/2013  . Chronic atrial fibrillation     a. Previously failed DCCV/amio/tikosyn  . NSVT (nonsustained ventricular  tachycardia)     a. Isolated event during 09/2007 adm.  . Brady-tachy syndrome     s/p permanent pacemaker placement  . Hypertension   . GI bleeding     Past Surgical History  Procedure Laterality Date  . Valve replacement      sever mitral regurgitation s/p mitral valve annuloplasty ring  . Cholecystectomy    . Appendectomy    . Irrigation and debridement abscess N/A 10/14/2013    Procedure: IRRIGATION AND DEBRIDEMENT PERINEAL ABSCESS;  Surgeon: Rolm Bookbinder, MD;  Location: [facility];  Service: General;  Laterality: N/A;  . Biv pacemaker generator change out N/A 12/13/2014    Procedure: BIV PACEMAKER GENERATOR CHANGE OUT;  Surgeon: Evans Lance, MD;  Location: [facility] CATH LAB;  Service: Cardiovascular;  Laterality: N/A;    Family History  Problem Relation Age of Onset  . Pancreatic cancer Father   . Other Mother     Mother died at 60 with no real medical problems   Social History:  reports that she quit smoking about 43 years ago. She does not have any smokeless tobacco history on file. She reports that she drinks alcohol. She reports that she does not use illicit drugs.  Allergies: No Known Allergies   (Not in a hospital admission)  Results for orders placed or performed during the hospital encounter of 08/14/15 (from the past 48 hour(s))  I-stat troponin, ED     Status: None   Collection Time: 08/14/15 10:54  PM  Result Value Ref Range   Troponin i, poc 0.04 0.00 - 0.08 ng/mL   Comment 3            Comment: Due to the release kinetics of cTnI, a negative result within the first hours of the onset of symptoms does not rule out myocardial infarction with certainty. If myocardial infarction is still suspected, repeat the test at appropriate intervals.   Basic metabolic panel     Status: Abnormal   Collection Time: 08/14/15 10:56 PM  Result Value Ref Range   Sodium 142 135 - 145 mmol/L   Potassium 3.8 3.5 - 5.1 mmol/L   Chloride 111 101 - 111 mmol/L   CO2 24 22 - 32  mmol/L   Glucose, Bld 130 (H) 65 - 99 mg/dL   BUN 28 (H) 6 - 20 mg/dL   Creatinine, Ser 1.83 (H) 0.44 - 1.00 mg/dL   Calcium 8.4 (L) 8.9 - 10.3 mg/dL   GFR calc non Af Amer 24 (L) >60 mL/min   GFR calc Af Amer 28 (L) >60 mL/min    Comment: (NOTE) The eGFR has been calculated using the CKD EPI equation. This calculation has not been validated in all clinical situations. eGFR's persistently <60 mL/min signify possible Chronic Kidney Disease.    Anion gap 7 5 - 15  CBC     Status: Abnormal   Collection Time: 08/14/15 10:56 PM  Result Value Ref Range   WBC 6.0 4.0 - 10.5 K/uL   RBC 3.44 (L) 3.87 - 5.11 MIL/uL   Hemoglobin 9.0 (L) 12.0 - 15.0 g/dL   HCT 30.6 (L) 36.0 - 46.0 %   MCV 89.0 78.0 - 100.0 fL   MCH 26.2 26.0 - 34.0 pg   MCHC 29.4 (L) 30.0 - 36.0 g/dL   RDW 22.2 (H) 11.5 - 15.5 %   Platelets 118 (L) 150 - 400 K/uL    Comment: PLATELET COUNT CONFIRMED BY SMEAR REPEATED TO VERIFY SPECIMEN CHECKED FOR CLOTS   Protime-INR - (order if Patient is taking Coumadin / Warfarin)     Status: Abnormal   Collection Time: 08/14/15 10:56 PM  Result Value Ref Range   Prothrombin Time 18.9 (H) 11.6 - 15.2 seconds   INR 1.58 (H) 0.00 - 1.49  Brain natriuretic peptide     Status: Abnormal   Collection Time: 08/14/15 10:57 PM  Result Value Ref Range   B Natriuretic Peptide 391.3 (H) 0.0 - 100.0 pg/mL   Dg Chest 2 View  08/14/2015   CLINICAL DATA:  Shortness of breath  EXAM: CHEST  2 VIEW  COMPARISON:  February 28, 2014  FINDINGS: The heart size and mediastinal contours are stable. Heart size is enlarged. Cardiac pacemaker is identified. Cardiac valvular replacement ring is unchanged. There is a small left pleural effusion. There is no pulmonary edema or focal pneumonia. The visualized skeletal structures are stable.  IMPRESSION: Small left pleural effusion.   Electronically Signed   By: Abelardo Diesel M.D.   On: 08/14/2015 23:13    Review of Systems  Constitutional: Positive for  malaise/fatigue. Negative for fever and chills.  HENT: Negative for ear discharge and ear pain.   Eyes: Negative for double vision and photophobia.  Respiratory: Positive for shortness of breath. Negative for hemoptysis and sputum production.   Cardiovascular: Positive for orthopnea and leg swelling. Negative for chest pain and palpitations.  Gastrointestinal: Negative for nausea, vomiting, diarrhea and constipation.  Genitourinary: Negative for dysuria, urgency, frequency and hematuria.  Musculoskeletal: Negative for myalgias and neck pain.  Skin: Positive for itching and rash.  Neurological: Positive for weakness. Negative for tingling, tremors, sensory change, speech change and headaches.  Endo/Heme/Allergies: Negative for polydipsia. Bruises/bleeds easily.  Psychiatric/Behavioral: Negative for suicidal ideas, hallucinations and substance abuse.    Blood pressure 109/48, pulse 70, temperature 98.1 F (36.7 C), temperature source Oral, resp. rate 18, height _0  (1.575 m), weight 101.152 kg (223 lb), last menstrual period 10/10/2013, SpO2 99 %. Physical Exam  Nursing note and vitals reviewed. Constitutional: She is oriented to person, place, and time. She appears well-developed and well-nourished. No distress.  HENT:  Head: Normocephalic and atraumatic.  Nose: Nose normal.  Mouth/Throat: Oropharynx is clear and moist. No oropharyngeal exudate.  Eyes: Conjunctivae and EOM are normal. Pupils are equal, round, and reactive to light. No scleral icterus.  Neck: Normal range of motion. Neck supple. JVD present. No tracheal deviation present.  Cardiovascular: Normal rate, regular rhythm and intact distal pulses.  Exam reveals no gallop.   Murmur heard. Soft systolic murmur at LSB  Respiratory: Effort normal and breath sounds normal. No respiratory distress. She has no wheezes. She has no rales.  GI: Soft. Bowel sounds are normal. She exhibits no distension. There is no tenderness. There is  no rebound.  Musculoskeletal: Normal range of motion. She exhibits edema. She exhibits no tenderness.  Neurological: She is alert and oriented to person, place, and time. No cranial nerve deficit. Coordination normal.  Skin: Skin is warm and dry. She is not diaphoretic. There is erythema.  Mild erythema right > left lower leg  Psychiatric: She has a normal mood and affect. Her behavior is normal. Thought content normal.    Bun/cr 28/1.8, BNP 391, h/h 9/30.6, platelets 118, INR 1.6 EKG: V-paced at 70 Blood cultures x 2 pending 11/14 echo: EF 35-40%, mild MR, mildly dilated LV/RV, PASP 47 mmHg Assessment/Plan [name] is an 81 yo woman with PMH of systolic and diastolic heart failure, BIV pacemaker for tachybrady syndrome, severe MR with previous MV repair, stage III chronic kidney disease, chronic atrial fibrillation on chronic anticoagulation with warfarin, moderate pulmonary hypertension and hypertension who presents with worsening heart failure symptoms and weight gain. Last known dry weight 220 lbs.  Problem List Acute on chronic systolic and diastolic heart failure Acute on chronic renal failure  Anemia Chronic Anticoagulation  Cellulitis  Hypertension MV s/p previous repair  Orthostatic hypotension with previosu falls on midodrine and TED hose Chronic atrial fibrillation Falls - recent also GI Bleed with acute blood loss down to hemoglobin 3 Plan 1. 40 mg IV lasix x1, then bid 2. Observation, trend cardiac biomarkers 3. No anticoagulation for now given recent fall, serious GI Bleed and supratherapeutic INR to 13 (currently 1.6) 4. Continue digoxin for rate control; she's been off metoprolol and not had tachycardia recently  5. Midodrine for orthostatic hypotension 6. Continue vancomycin pending cultures per ER; can likely transition to oral therapy 7. Obtain urine culture   KELLY, JACOB 08/15/2015, 1:00 AM

## 2015-08-15 NOTE — Progress Notes (Signed)
ANTIBIOTIC CONSULT NOTE - INITIAL  Pharmacy Consult for Vancomycin  Indication: RLE cellulitis   No Known Allergies  Patient Measurements: Height: 5\' 2"  (157.5 cm) Weight: 223 lb (101.152 kg) IBW/kg (Calculated) : 50.1  Vital Signs: Temp: 98.1 F (36.7 C) (09/27 2225) Temp Source: Oral (09/27 2225) BP: 107/37 mmHg (09/28 0130) Pulse Rate: 70 (09/28 0130)  Labs:  Recent Labs  08/14/15 2256  WBC 6.0  HGB 9.0*  PLT 118*  CREATININE 1.83*   Estimated Creatinine Clearance: 24.6 mL/min (by C-G formula based on Cr of 1.83).  Medical History: Past Medical History  Diagnosis Date  . Mitral valve disorder     a. Severe MR s/p repair 2003 (28 mm annuloplasty ring and and oversew of LAA). No CAD by cath at that time.  . Pacemaker     a. 2003: post-op afib after MR repair then symptomatic bradycardia requiring pacemaker. b. Upgrade to Medtronic Bi-V Pacemaker 2007.  [name] Cirrhosis     a. Newly recognized 09/2013 - by CT.  . CKD (chronic kidney disease)   . Pulmonary hypertension, mild     secondary to mild/mod LV dysfunction(EF 40%)  . Diastolic dysfunction   . Spinal stenosis     shes recieved epidural steroid injections in the past  . Chronic combined systolic and diastolic CHF (congestive heart failure)     EF 35-40% on echo 09/2013  . Chronic atrial fibrillation     a. Previously failed DCCV/amio/tikosyn  . NSVT (nonsustained ventricular tachycardia)     a. Isolated event during 09/2007 adm.  . Brady-tachy syndrome     s/p permanent pacemaker placement  . Hypertension   . GI bleeding      Assessment: 81 y/o F with bilateral leg swelling with redness on the RLE. WBC WNL. Renal dysfunction present with Scr 1.83 (usually around 1.3-1.5), other labs as above.   Goal of Therapy:  Vancomycin trough level 15-20 mcg/ml  Plan:  -Vancomycin 1500 mg IV q48h, may need dose change if Scr trends down -Trend WBC, temp, renal function  -Drug levels as indicated   Narda Bonds 08/15/2015,1:39 AM

## 2015-08-15 NOTE — Evaluation (Signed)
Physical Therapy Evaluation Patient Details Name: [name] MRN: [id number] DOB: 14-Feb-1926 Today's Date: 08/15/2015   History of Present Illness  [name] is an 81 yo woman with PMH of systolic and diastolic heart failure, BIV pacemaker for tachybrady syndrome, severe MR with previous MV repair, stage III chronic kidney disease, chronic atrial fibrillation on chronic anticoagulation with warfarin, moderate pulmonary hypertension and hypertension who presents with worsening heart failure symptoms and weight gain. Last known dry weight 220 lbs. She's recently been hospitalized in [place] (last week) and had a GI bleed and a fall with hemoglobin as low as 3 and INr of 13.  Clinical Impression  Pt admitted with above diagnosis. Pt currently with functional limitations due to the deficits listed below (see PT Problem List). Pt should be fine to d/c home with HHPT f/u and use of RW as PTA.  Will follow acutely.   Pt will benefit from skilled PT to increase their independence and safety with mobility to allow discharge to the venue listed below.      Follow Up Recommendations Home health PT;Supervision - Intermittent    Equipment Recommendations  None recommended by PT    Recommendations for Other Services       Precautions / Restrictions Precautions Precautions: Fall Restrictions Weight Bearing Restrictions: No      Mobility  Bed Mobility Overal bed mobility: Needs Assistance Bed Mobility: Supine to Sit;Sit to Supine     Supine to sit: Min assist Sit to supine: Min assist   General bed mobility comments: Needed Assist for LEs  Transfers Overall transfer level: Needs assistance Equipment used: Rolling walker (2 wheeled) Transfers: Sit to/from [facility] Sit to Stand: Min guard Stand pivot transfers: Min guard       General transfer comment: Pt able to stand and turn and get to the 3N1 on holding onto 3N1 to stand and turn with somewhat flexed posture.     Ambulation/Gait Ambulation/Gait assistance: Min guard Ambulation Distance (Feet): 25 Feet Assistive device: Rolling walker (2 wheeled) Gait Pattern/deviations: Step-through pattern;Decreased stride length;Decreased step length - right;Decreased step length - left;Decreased weight shift to right;Trunk flexed;Wide base of support;Antalgic   Gait velocity interpretation: Below normal speed for age/gender General Gait Details: Pt was able to ambulate with RW to door.  Slow gait with flexed posture with cues for posture and for staying close to rW.    Stairs            Wheelchair Mobility    Modified Rankin (Stroke Patients Only)       Balance Overall balance assessment: Needs assistance;History of Falls Sitting-balance support: No upper extremity supported;Feet supported Sitting balance-Leahy Scale: Fair     Standing balance support: Bilateral upper extremity supported;During functional activity Standing balance-Leahy Scale: Poor Standing balance comment: requires UE support on RW.                              Pertinent Vitals/Pain Pain Assessment: 0-10 Pain Score: 5  Pain Location: right foot Pain Descriptors / Indicators: Aching;Sore Pain Intervention(s): Limited activity within patient's tolerance;Monitored during session;Premedicated before session;Repositioned  VSS    Home Living Family/patient expects to be discharged to:: Private residence Living Arrangements: Children Available Help at Discharge: Family;Available PRN/intermittently (daughter works with pt alone during day) Type of Home: House Home Access: Stairs to enter Entrance Stairs-Rails: None Entrance Stairs-Number of Steps: 1 Home Layout: Two level;Able to live on main level  with bedroom/bathroom Home Equipment: Walker - 2 wheels;Walker - 4 wheels;Bedside commode;Shower seat - built in;Grab bars - tub/shower;Hand held shower head;Wheelchair - manual;Hospital bed      Prior Function  Level of Independence: Independent with assistive device(s)         Comments: Had gotten to where she wasnt using the walker all the time, reports she walks in house without the walker.      Hand Dominance   Dominant Hand: Left    Extremity/Trunk Assessment   Upper Extremity Assessment: Defer to OT evaluation           Lower Extremity Assessment: Generalized weakness      Cervical / Trunk Assessment: Kyphotic  Communication   Communication: No difficulties  Cognition Arousal/Alertness: Awake/alert Behavior During Therapy: WFL for tasks assessed/performed Overall Cognitive Status: Within Functional Limits for tasks assessed                      General Comments General comments (skin integrity, edema, etc.): Bil LEs swollen    Exercises General Exercises - Lower Extremity Ankle Circles/Pumps: AROM;Both;5 reps;Supine Long Arc Quad: AROM;Both;5 reps;Seated      Assessment/Plan    PT Assessment Patient needs continued PT services  PT Diagnosis Generalized weakness   PT Problem List Decreased activity tolerance;Decreased balance;Decreased mobility;Decreased knowledge of use of DME;Decreased safety awareness;Decreased knowledge of precautions;Pain  PT Treatment Interventions DME instruction;Gait training;Functional mobility training;Therapeutic activities;Therapeutic exercise;Balance training;Patient/family education   PT Goals (Current goals can be found in the Care Plan section) Acute Rehab PT Goals Patient Stated Goal: to go home PT Goal Formulation: With patient Time For Goal Achievement: 08/29/15 Potential to Achieve Goals: Good    Frequency Min 3X/week   Barriers to discharge Decreased caregiver support      Co-evaluation               End of Session Equipment Utilized During Treatment: Gait belt Activity Tolerance: Patient limited by fatigue Patient left: in bed;with call bell/phone within reach;with bed alarm set Nurse  Communication: Mobility status    Functional Assessment Tool Used: clinical judgment Functional Limitation: Mobility: Walking and moving around Mobility: Walking and Moving Around Current Status [id number]): At least 1 percent but less than 20 percent impaired, limited or restricted Mobility: Walking and Moving Around Goal Status [phone]): 0 percent impaired, limited or restricted    Time: 1046-1101 PT Time Calculation (min) (ACUTE ONLY): 15 min   Charges:   PT Evaluation $Initial PT Evaluation Tier I: 1 Procedure     PT G Codes:   PT G-Codes **NOT FOR INPATIENT CLASS** Functional Assessment Tool Used: clinical judgment Functional Limitation: Mobility: Walking and moving around Mobility: Walking and Moving Around Current Status [id number]): At least 1 percent but less than 20 percent impaired, limited or restricted Mobility: Walking and Moving Around Goal Status [phone]): 0 percent impaired, limited or restricted    Denice Paradise 08/15/2015, 1:36 PM [place],PT Acute Rehabilitation [phone] (pager)

## 2015-08-15 NOTE — Consult Note (Signed)
Reason for Consult: Right ankle pain and abnormal thyroid function tests. Referring Physician: Dr. Ellyn Hack.  [name] is an 81 y.o. female.  HPI: With history of chronic combined systolic and diastolic heart failure, chronic atrial fibrillation, pacemaker placement, severe mitral valve replacement who was recently admitted in [place] for GI bleed and at that time most of patient medications were held was admitted recently for CHF exacerbation. Patient states she was just discharged from the hospital 3 days ago in [place] and started developing right foot pain and shortness of breath. Patient was admitted for CHF exacerbation. Patient states she has been having acute pain in her right foot which increased on placement of weight. Patient was started on empiric antibiotics for possible cellulitis. On my exam patient has tenderness on palpation of the right ankle joint. In addition patient's thyroid function shows mildly elevated TSH and free T4. Medical consult was called for further management of right toe pain and abnormal thyroid function test.  Past Medical History  Diagnosis Date  . Mitral valve disorder     a. Severe MR s/p repair 2003 (28 mm annuloplasty ring and and oversew of LAA). No CAD by cath at that time.  . Pacemaker     a. 2003: post-op afib after MR repair then symptomatic bradycardia requiring pacemaker. b. Upgrade to [employer]onic Bi-V Pacemaker 2007.  [name] Cirrhosis     a. Newly recognized 09/2013 - by CT.  . CKD (chronic kidney disease)   . Pulmonary hypertension, mild     secondary to mild/mod LV dysfunction(EF 40%)  . Diastolic dysfunction   . Spinal stenosis     shes recieved epidural steroid injections in the past  . Chronic combined systolic and diastolic CHF (congestive heart failure)     EF 35-40% on echo 09/2013  . Chronic atrial fibrillation     a. Previously failed DCCV/amio/tikosyn  . NSVT (nonsustained ventricular tachycardia)     a. Isolated event during 09/2007  adm.  . Brady-tachy syndrome     s/p permanent pacemaker placement  . Hypertension   . GI bleeding   . Shortness of breath dyspnea   . Cellulitis and abscess of foot 08/15/2015    RT FOOT    Past Surgical History  Procedure Laterality Date  . Valve replacement      sever mitral regurgitation s/p mitral valve annuloplasty ring  . Cholecystectomy    . Appendectomy    . Irrigation and debridement abscess N/A 10/14/2013    Procedure: IRRIGATION AND DEBRIDEMENT PERINEAL ABSCESS;  Surgeon: Rolm Bookbinder, MD;  Location: [place];  Service: General;  Laterality: N/A;  . Biv pacemaker generator change out N/A 12/13/2014    Procedure: BIV PACEMAKER GENERATOR CHANGE OUT;  Surgeon: Evans Lance, MD;  Location: [facility] CATH LAB;  Service: Cardiovascular;  Laterality: N/A;    Family History  Problem Relation Age of Onset  . Pancreatic cancer Father   . Other Mother     Mother died at 52 with no real medical problems    Social History:  reports that she quit smoking about 43 years ago. She has never used smokeless tobacco. She reports that she drinks alcohol. She reports that she does not use illicit drugs.  Allergies: No Known Allergies  Medications: I have reviewed the patient's current medications.  Results for orders placed or performed during the hospital encounter of 08/14/15 (from the past 48 hour(s))  I-stat troponin, ED     Status: None   Collection Time:  08/14/15 10:54 PM  Result Value Ref Range   Troponin i, poc 0.04 0.00 - 0.08 ng/mL   Comment 3            Comment: Due to the release kinetics of cTnI, a negative result within the first hours of the onset of symptoms does not rule out myocardial infarction with certainty. If myocardial infarction is still suspected, repeat the test at appropriate intervals.   Basic metabolic panel     Status: Abnormal   Collection Time: 08/14/15 10:56 PM  Result Value Ref Range   Sodium 142 135 - 145 mmol/L   Potassium 3.8 3.5 - 5.1  mmol/L   Chloride 111 101 - 111 mmol/L   CO2 24 22 - 32 mmol/L   Glucose, Bld 130 (H) 65 - 99 mg/dL   BUN 28 (H) 6 - 20 mg/dL   Creatinine, Ser 1.83 (H) 0.44 - 1.00 mg/dL   Calcium 8.4 (L) 8.9 - 10.3 mg/dL   GFR calc non Af Amer 24 (L) >60 mL/min   GFR calc Af Amer 28 (L) >60 mL/min    Comment: (NOTE) The eGFR has been calculated using the CKD EPI equation. This calculation has not been validated in all clinical situations. eGFR's persistently <60 mL/min signify possible Chronic Kidney Disease.    Anion gap 7 5 - 15  CBC     Status: Abnormal   Collection Time: 08/14/15 10:56 PM  Result Value Ref Range   WBC 6.0 4.0 - 10.5 K/uL   RBC 3.44 (L) 3.87 - 5.11 MIL/uL   Hemoglobin 9.0 (L) 12.0 - 15.0 g/dL   HCT 30.6 (L) 36.0 - 46.0 %   MCV 89.0 78.0 - 100.0 fL   MCH 26.2 26.0 - 34.0 pg   MCHC 29.4 (L) 30.0 - 36.0 g/dL   RDW 22.2 (H) 11.5 - 15.5 %   Platelets 118 (L) 150 - 400 K/uL    Comment: PLATELET COUNT CONFIRMED BY SMEAR REPEATED TO VERIFY SPECIMEN CHECKED FOR CLOTS   Protime-INR - (order if Patient is taking Coumadin / Warfarin)     Status: Abnormal   Collection Time: 08/14/15 10:56 PM  Result Value Ref Range   Prothrombin Time 18.9 (H) 11.6 - 15.2 seconds   INR 1.58 (H) 0.00 - 1.49  Brain natriuretic peptide     Status: Abnormal   Collection Time: 08/14/15 10:57 PM  Result Value Ref Range   B Natriuretic Peptide 391.3 (H) 0.0 - 100.0 pg/mL  Troponin I     Status: Abnormal   Collection Time: 08/15/15  2:40 AM  Result Value Ref Range   Troponin I 0.04 (H) <0.031 ng/mL    Comment:        PERSISTENTLY INCREASED TROPONIN VALUES IN THE RANGE OF 0.04-0.49 ng/mL CAN BE SEEN IN:       -UNSTABLE ANGINA       -CONGESTIVE HEART FAILURE       -MYOCARDITIS       -CHEST TRAUMA       -ARRYHTHMIAS       -LATE PRESENTING MYOCARDIAL INFARCTION       -COPD   CLINICAL FOLLOW-UP RECOMMENDED.   TSH     Status: Abnormal   Collection Time: 08/15/15  2:40 AM  Result Value Ref Range    TSH 5.202 (H) 0.350 - 4.500 uIU/mL  Magnesium     Status: None   Collection Time: 08/15/15  2:40 AM  Result Value Ref Range   Magnesium 1.9 1.7 -  2.4 mg/dL  T4, free     Status: Abnormal   Collection Time: 08/15/15  2:40 AM  Result Value Ref Range   Free T4 1.15 (H) 0.61 - 1.12 ng/dL  Troponin I     Status: Abnormal   Collection Time: 08/15/15  8:28 AM  Result Value Ref Range   Troponin I 0.04 (H) <0.031 ng/mL    Comment:        PERSISTENTLY INCREASED TROPONIN VALUES IN THE RANGE OF 0.04-0.49 ng/mL CAN BE SEEN IN:       -UNSTABLE ANGINA       -CONGESTIVE HEART FAILURE       -MYOCARDITIS       -CHEST TRAUMA       -ARRYHTHMIAS       -LATE PRESENTING MYOCARDIAL INFARCTION       -COPD   CLINICAL FOLLOW-UP RECOMMENDED.   Troponin I     Status: None   Collection Time: 08/15/15  1:55 PM  Result Value Ref Range   Troponin I 0.03 <0.031 ng/mL    Comment:        NO INDICATION OF MYOCARDIAL INJURY.     Dg Chest 2 View  08/14/2015   CLINICAL DATA:  Shortness of breath  EXAM: CHEST  2 VIEW  COMPARISON:  February 28, 2014  FINDINGS: The heart size and mediastinal contours are stable. Heart size is enlarged. Cardiac pacemaker is identified. Cardiac valvular replacement ring is unchanged. There is a small left pleural effusion. There is no pulmonary edema or focal pneumonia. The visualized skeletal structures are stable.  IMPRESSION: Small left pleural effusion.   Electronically Signed   By: Abelardo Diesel M.D.   On: 08/14/2015 23:13    Review of Systems  Constitutional: Negative.   HENT: Negative.   Eyes: Negative.   Respiratory: Negative.   Cardiovascular: Negative.   Gastrointestinal: Negative.   Genitourinary: Negative.   Musculoskeletal:       Right ankle pain.  Skin: Negative.   Neurological: Negative.   Endo/Heme/Allergies: Negative.   Psychiatric/Behavioral: Negative.    Blood pressure 116/42, pulse 71, temperature 98.7 F (37.1 C), temperature source Oral, resp. rate  20, height 5' 2" (1.575 m), weight 101.243 kg (223 lb 3.2 oz), last menstrual period 10/10/2013, SpO2 98 %. Physical Exam  Constitutional: She is oriented to person, place, and time. She appears well-developed and well-nourished. No distress.  HENT:  Head: Normocephalic and atraumatic.  Eyes: Conjunctivae are normal. Pupils are equal, round, and reactive to light. Right eye exhibits no discharge. Left eye exhibits no discharge.  Neck: Normal range of motion. Neck supple.  Cardiovascular: Normal rate.   Respiratory: Effort normal. No respiratory distress.  GI: Soft. Bowel sounds are normal. She exhibits no distension. There is no tenderness.  Musculoskeletal:  Right ankle is tender to touch and has pain on movement.  Neurological: She is alert and oriented to person, place, and time.  Skin: Skin is warm and dry. She is not diaphoretic.  Psychiatric: Her behavior is normal.    Assessment/Plan: #1. Right ankle pain - differential includes cellulitis versus gout. Patient used to be on colchicine which patient has not been taking recently after patient was admitted for GI bleed. At this time patient has significant tenderness on palpation for which I have ordered CT of the right ankle since patient cannot get MRI due to pacemaker placement. We will check sedimentation rate and uric acid levels and CRP levels. For now we will continue vancomycin. If  uric acid levels elevated patient may need to be restarted on colchicine if there is no contraindications. #2. Abnormal thyroid function tests - more consistent with sick euthyroid. Repeat thyroid function tests in 4 weeks. Closely observe. #3. Chronic systolic and diastolic heart failure - per cardiology. #4. History of pacemaker placement. #5. Recent admission in [place] for GI bleed and patient's Coumadin is on hold.  I have reviewed patient's old charts on labs. Thanks for involving [place] in patient's care we will follow along with  you.  Rise Patience 08/15/2015, 9:49 PM

## 2015-08-15 NOTE — Progress Notes (Signed)
  Echocardiogram 2D Echocardiogram has been performed.  [name] 08/15/2015, 1:41 PM

## 2015-08-16 ENCOUNTER — Observation Stay (HOSPITAL_COMMUNITY): Payer: Medicare Other

## 2015-08-16 ENCOUNTER — Observation Stay (HOSPITAL_BASED_OUTPATIENT_CLINIC_OR_DEPARTMENT_OTHER): Payer: Medicare Other

## 2015-08-16 ENCOUNTER — Ambulatory Visit: Payer: Medicare Other | Admitting: Cardiology

## 2015-08-16 DIAGNOSIS — N183 Chronic kidney disease, stage 3 (moderate): Secondary | ICD-10-CM | POA: Diagnosis not present

## 2015-08-16 DIAGNOSIS — R600 Localized edema: Secondary | ICD-10-CM

## 2015-08-16 DIAGNOSIS — Z8639 Personal history of other endocrine, nutritional and metabolic disease: Secondary | ICD-10-CM

## 2015-08-16 DIAGNOSIS — R946 Abnormal results of thyroid function studies: Secondary | ICD-10-CM | POA: Diagnosis not present

## 2015-08-16 DIAGNOSIS — I482 Chronic atrial fibrillation: Secondary | ICD-10-CM | POA: Diagnosis not present

## 2015-08-16 DIAGNOSIS — M109 Gout, unspecified: Secondary | ICD-10-CM

## 2015-08-16 DIAGNOSIS — I5043 Acute on chronic combined systolic (congestive) and diastolic (congestive) heart failure: Secondary | ICD-10-CM | POA: Diagnosis not present

## 2015-08-16 DIAGNOSIS — M10079 Idiopathic gout, unspecified ankle and foot: Secondary | ICD-10-CM | POA: Diagnosis not present

## 2015-08-16 DIAGNOSIS — M25571 Pain in right ankle and joints of right foot: Secondary | ICD-10-CM

## 2015-08-16 DIAGNOSIS — I10 Essential (primary) hypertension: Secondary | ICD-10-CM | POA: Diagnosis not present

## 2015-08-16 DIAGNOSIS — L03115 Cellulitis of right lower limb: Secondary | ICD-10-CM | POA: Diagnosis not present

## 2015-08-16 DIAGNOSIS — M10071 Idiopathic gout, right ankle and foot: Secondary | ICD-10-CM | POA: Diagnosis not present

## 2015-08-16 LAB — CBC
HCT: 29 % — ABNORMAL LOW (ref 36.0–46.0)
HEMOGLOBIN: 8.8 g/dL — AB (ref 12.0–15.0)
MCH: 26.7 pg (ref 26.0–34.0)
MCHC: 30.3 g/dL (ref 30.0–36.0)
MCV: 88.1 fL (ref 78.0–100.0)
PLATELETS: 94 10*3/uL — AB (ref 150–400)
RBC: 3.29 MIL/uL — AB (ref 3.87–5.11)
RDW: 22.3 % — ABNORMAL HIGH (ref 11.5–15.5)
WBC: 5.4 10*3/uL (ref 4.0–10.5)

## 2015-08-16 LAB — BASIC METABOLIC PANEL
ANION GAP: 11 (ref 5–15)
BUN: 32 mg/dL — ABNORMAL HIGH (ref 6–20)
CALCIUM: 8.4 mg/dL — AB (ref 8.9–10.3)
CO2: 23 mmol/L (ref 22–32)
CREATININE: 1.67 mg/dL — AB (ref 0.44–1.00)
Chloride: 108 mmol/L (ref 101–111)
GFR, EST AFRICAN AMERICAN: 31 mL/min — AB (ref >60)
GFR, EST NON AFRICAN AMERICAN: 27 mL/min — AB (ref >60)
Glucose, Bld: 87 mg/dL (ref 65–99)
Potassium: 3.6 mmol/L (ref 3.5–5.1)
SODIUM: 142 mmol/L (ref 135–145)

## 2015-08-16 MED ORDER — PREDNISONE 50 MG PO TABS
60.0000 mg | ORAL_TABLET | Freq: Every day | ORAL | Status: DC
  Administered 2015-08-16: 60 mg via ORAL
  Filled 2015-08-16 (×2): qty 1

## 2015-08-16 MED ORDER — MIDODRINE HCL 5 MG PO TABS
2.5000 mg | ORAL_TABLET | Freq: Three times a day (TID) | ORAL | Status: DC
  Administered 2015-08-17 – 2015-08-18 (×5): 2.5 mg via ORAL
  Filled 2015-08-16 (×5): qty 1

## 2015-08-16 MED ORDER — PREDNISONE 50 MG PO TABS
60.0000 mg | ORAL_TABLET | Freq: Every day | ORAL | Status: DC
  Administered 2015-08-17 – 2015-08-18 (×2): 60 mg via ORAL
  Filled 2015-08-16 (×4): qty 1

## 2015-08-16 NOTE — Evaluation (Addendum)
Occupational Therapy Evaluation Patient Details Name: [name] MRN: [id number] DOB: 1926-08-19 Today's Date: 08/16/2015    History of Present Illness Ms. Othman is an 81 yo woman with PMH of systolic and diastolic heart failure, BIV pacemaker for tachybrady syndrome, severe MR with previous MV repair, stage III chronic kidney disease, chronic atrial fibrillation on chronic anticoagulation with warfarin, moderate pulmonary hypertension and hypertension who presents with worsening heart failure symptoms and weight gain. Last known dry weight 220 lbs. She's recently been hospitalized in [place] (last week) and had a GI bleed and a fall with hemoglobin as low as 3 and INr of 13.   Clinical Impression   Patient presenting with decreased I in self care, decreased balance, decreased I with functional mobility. Patient reports being Mod I  PTA. Patient currently functioning at min - mod overall. Patient will benefit from acute OT to increase overall independence in the areas of ADLs, functional mobility, safety in order to safely discharge home.    Follow Up Recommendations  Home health OT;Supervision - Intermittent    Equipment Recommendations  None recommended by OT    Recommendations for Other Services  none     Precautions / Restrictions Precautions Precautions: Fall Restrictions Weight Bearing Restrictions: No      Mobility Bed Mobility Overal bed mobility: Needs Assistance Bed Mobility: Supine to Sit;Sit to Supine     Supine to sit: Min assist Sit to supine: Min assist   General bed mobility comments: Needed Assist for LEs  Transfers Overall transfer level: Needs assistance   Transfers: Sit to/from Stand;Stand Pivot Transfers Sit to Stand: Mod assist Stand pivot transfers: Mod assist       General transfer comment: Pt able to stand and turn while holding onto [facility] with mod A for balance. Pt requiring lifting assisting for sit <>stand.     Balance Overall  balance assessment: Needs assistance;History of Falls Sitting-balance support: No upper extremity supported;Feet supported Sitting balance-Leahy Scale: Fair     Standing balance support: During functional activity;Bilateral upper extremity supported Standing balance-Leahy Scale: Poor                              ADL Overall ADL's : Needs assistance/impaired                                       General ADL Comments: Pt able to don LB clothing onto L LE during session but pt has 10/10 pain when putting bearing weight or touch R foot at this time . Pt reports, "I think its the gout." Pt performed stand pivot transfer onto [facility] next to bed with use of RW and needing Mod A. Pt able to complete UB self care and grooming with set up A in seated position.                Pertinent Vitals/Pain Pain Assessment: 0-10 Pain Score: 10-Worst pain ever Pain Location: R foot Pain Descriptors / Indicators: Aching;Sore Pain Intervention(s): Limited activity within patient's tolerance;Monitored during session;Repositioned;Relaxation     Hand Dominance Left   Extremity/Trunk Assessment Upper Extremity Assessment Upper Extremity Assessment: Generalized weakness   Lower Extremity Assessment Lower Extremity Assessment: Defer to PT evaluation   Cervical / Trunk Assessment Cervical / Trunk Assessment: Kyphotic   Communication Communication Communication: No difficulties   Cognition Arousal/Alertness: Awake/alert Behavior During  Therapy: WFL for tasks assessed/performed Overall Cognitive Status: Within Functional Limits for tasks assessed                                Home Living Family/patient expects to be discharged to:: Private residence Living Arrangements: Children Available Help at Discharge: Family;Available PRN/intermittently (daughter works part time - pt alone some during the day) Type of Home: House Home Access: Stairs to enter [employer] of Steps: 1 Entrance Stairs-Rails: None Home Layout: Two level;Able to live on main level with bedroom/bathroom     Bathroom Shower/Tub: Walk-in shower;Curtain   Bathroom Toilet: Standard Bathroom Accessibility: Yes   Home Equipment: Environmental consultant - 2 wheels;Walker - 4 wheels;Bedside commode;Shower seat - built in;Grab bars - tub/shower;Hand held shower head;Wheelchair - manual;Hospital bed          Prior Functioning/Environment Level of Independence: Independent with assistive device(s)        Comments: Had gotten to where she wasnt using the walker all the time, reports she walks in house without the walker.     OT Diagnosis: Generalized weakness;Acute pain   OT Problem List: Decreased strength;Decreased activity tolerance;Pain;Decreased safety awareness;Impaired balance (sitting and/or standing)   OT Treatment/Interventions: Self-care/ADL training;Therapeutic exercise;Energy conservation;DME and/or AE instruction;Therapeutic activities;Patient/family education;Balance training    OT Goals(Current goals can be found in the care plan section) Acute Rehab OT Goals Patient Stated Goal: to go home OT Goal Formulation: With patient Time For Goal Achievement: 08/30/15 Potential to Achieve Goals: Fair ADL Goals Pt Will Perform Grooming: with modified independence;sitting Pt Will Perform Upper Body Bathing: with modified independence;sitting Pt Will Perform Lower Body Bathing: with modified independence;sit to/from stand Pt Will Perform Upper Body Dressing: with modified independence;sitting Pt Will Perform Lower Body Dressing: with modified independence;sit to/from stand Pt Will Transfer to Toilet: with modified independence;regular height toilet Pt Will Perform Toileting - Clothing Manipulation and hygiene: with modified independence;sit to/from stand Pt Will Perform Tub/Shower Transfer: grab bars;shower seat;rolling walker;Shower transfer;with supervision  OT Frequency:  Min 2X/week   Barriers to D/C:    none known at this time          End of Session Equipment Utilized During Treatment: Other (comment) (BSC)  Activity Tolerance: Patient tolerated treatment well Patient left: in bed;with call bell/phone within reach;with bed alarm set   Time: 1457-1520 OT Time Calculation (min): 23 min Charges:  OT General Charges $OT Visit: 1 Procedure OT Evaluation $Initial OT Evaluation Tier I: 1 Procedure OT Treatments $Self Care/Home Management : 8-22 mins Functional Assessment Tool Used: clinical judgment Functional Limitation: Self Care Self Care: Current Status [id number]  percent impaired, limited or restricted Self care: Goal Status [id number]):  CI At least 1 percent but less than 20 percent impaired, limited or restricted         Benay Pillow L, MS, OTR/L 08/16/2015, 4:08 PM

## 2015-08-16 NOTE — Progress Notes (Signed)
Patient Name: [name] Date of Encounter: 08/16/2015  Pt. Profile: [name] is an 81 yo woman with PMH of systolic and diastolic heart failure, BIV pacemaker for tachybrady syndrome, severe MR with previous MV repair, stage III chronic kidney disease, chronic atrial fibrillation on chronic anticoagulation with warfarin, moderate pulmonary hypertension and hypertension who presents with worsening heart failure symptoms and weight gain. Last known dry weight 220 lbs. She's recently been hospitalized in [place] (last week) and had a GI bleed and a fall with hemoglobin as low as 3 and INr of 13.  SUBJECTIVE  Breathing improved, however not at baseline. No CP. Still having R leg pain.   CURRENT MEDS . beta carotene w/minerals  1 tablet Oral QPM  . digoxin  0.0625 mg Oral Daily  . furosemide  60 mg Intravenous BID  . loratadine  10 mg Oral Daily  . midodrine  2.5 mg Oral TID WC  . multivitamin with minerals  1 tablet Oral Daily  . pantoprazole  40 mg Oral Daily  . sodium chloride  3 mL Intravenous Q12H  . [START ON 08/17/2015] vancomycin  1,500 mg Intravenous Q48H    OBJECTIVE  Filed Vitals:   08/16/15 0308 08/16/15 0414 08/16/15 0700 08/16/15 1100  BP:  101/29 112/43 123/39  Pulse:  70 71 68  Temp:  97.7 F (36.5 C) 98.7 F (37.1 C) 98.4 F (36.9 C)  TempSrc:  Oral Oral Oral  Resp:  20 20   Height:      Weight: 218 lb 5.4 oz (99.038 kg)     SpO2:  98% 97% 98%    Intake/Output Summary (Last 24 hours) at 08/16/15 1127 Last data filed at 08/16/15 1000  Gross per 24 hour  Intake    998 ml  Output   3050 ml  Net  -2052 ml   Filed Weights   08/14/15 2225 08/15/15 0207 08/16/15 0308  Weight: 223 lb (101.152 kg) 223 lb 3.2 oz (101.243 kg) 218 lb 5.4 oz (99.038 kg)    PHYSICAL EXAM  General: Pleasant, NAD. Neuro: Alert and oriented X 3. Moves all extremities spontaneously. Psych: Normal affect. HEENT:  Normal  Neck: Supple without bruits orJVD. Lungs:  Resp  regular and unlabored. Bibasilar diminished breath sounds Heart: RRR no s3, s4. Systolic murmurs. Abdomen: Soft, non-tender, non-distended, BS + x 4.  Extremities: No clubbing, cyanosis. 1+ BL LE edema. DP/PT/Radials 2+ and equal bilaterally. SKIN: Right ankle tender and warm to touch with erythema.  Accessory Clinical Findings  CBC  Recent Labs  08/14/15 2256 08/16/15 0302  WBC 6.0 5.4  HGB 9.0* 8.8*  HCT 30.6* 29.0*  MCV 89.0 88.1  PLT 118* 94*   Basic Metabolic Panel  Recent Labs  08/14/15 2256 08/15/15 0240 08/16/15 0302  NA 142  --  142  K 3.8  --  3.6  CL 111  --  108  CO2 24  --  23  GLUCOSE 130*  --  87  BUN 28*  --  32*  CREATININE 1.83*  --  1.67*  CALCIUM 8.4*  --  8.4*  MG  --  1.9  --    Cardiac Enzymes  Recent Labs  08/15/15 0240 08/15/15 0828 08/15/15 1355  TROPONINI 0.04* 0.04* 0.03   Thyroid Function Tests  Recent Labs  08/15/15 0240  TSH 5.202*    TELE  V paced  Radiology/Studies  Dg Chest 2 View  08/14/2015   CLINICAL DATA:  Shortness of breath  EXAM: CHEST  2 VIEW  COMPARISON:  February 28, 2014  FINDINGS: The heart size and mediastinal contours are stable. Heart size is enlarged. Cardiac pacemaker is identified. Cardiac valvular replacement ring is unchanged. There is a small left pleural effusion. There is no pulmonary edema or focal pneumonia. The visualized skeletal structures are stable.  IMPRESSION: Small left pleural effusion.   Electronically Signed   By: Abelardo Diesel M.D.   On: 08/14/2015 23:13   Ct Ankle Right Wo Contrast  08/16/2015   CLINICAL DATA:  81 year old female with right ankle pain and swelling for 2 days. No known injury.  EXAM: CT OF THE RIGHT ANKLE WITHOUT CONTRAST  TECHNIQUE: Multidetector CT imaging of the right ankle was performed according to the standard protocol. Multiplanar CT image reconstructions were also generated.  COMPARISON:  No recent exams.  Radiographs from 11/28/2013 reviewed.  FINDINGS: Diffuse  subcutaneous soft tissue edema and skin thickening of the included right foot and ankle. No focal fluid collection or abscess. No ankle joint effusion.  Within subcutaneous tissues about the plantar heel there is a amorphous rounded calcification measuring 2.6 x 1.9 x 1.5 cm. This extends to the skin surface. No disproportionate soft tissue stranding.  There is no fracture, dislocation, osseous erosion or periosteal reaction. No bony destructive change. Mild tibial talar joint space narrowing consistent with osteoarthritis. Scattered tarsal osteoarthritis. No erosive change or findings to suggest inflammatory arthropathy. Small plantar calcaneal spur. Moderate Achilles tendon enthesophyte.  IMPRESSION: 1. Diffuse subcutaneous soft tissue edema and skin thickening of the right ankle, nonspecific. This may reflect cellulitis in the appropriate clinical setting. Alternatively, edema or venous insufficiency could have a similar appearance. 2. Amorphous soft tissue calcifications in the plantar foot subjacent to the heel. This is nonspecific, can be seen in setting of prior hematoma. Gout could have this appearance but is an unusual location. 3. No acute osseous abnormalities.   Electronically Signed   By: Jeb Levering M.D.   On: 08/16/2015 00:25   Echo 08/15/15 LV EF: 45% -  50%  ------------------------------------------------------------------- Indications:   CHF - 428.0.  ------------------------------------------------------------------- History:  PMH:  Atrial fibrillation. Risk factors: Chronic kidney disease. Hypertension.  ------------------------------------------------------------------- Study Conclusions  - Left ventricle: The cavity size was normal. Wall thickness was normal. Systolic function was mildly reduced. The estimated ejection fraction was in the range of 45% to 50%. Mild diffuse hypokinesis with no identifiable regional variations. - Ventricular septum: Septal  motion showed paradox. The contour showed diastolic flattening. These changes are consistent with RV volume overload. - Mitral valve: Prior procedures included surgical repair. An annular ring prosthesis was present. There was mild regurgitation. Valve area by continuity equation (using LVOT flow): 2.09 cm^2. - Left atrium: The atrium was severely dilated. - Right ventricle: The cavity size was mildly dilated. Wall thickness was normal. Systolic function was mildly reduced. - Right atrium: The atrium was severely dilated. - Tricuspid valve: There was severe regurgitation. A diagnosis of severe regurgitation is supported by hepatic vein systolic flow reversal. - Pulmonary arteries: Systolic pressure was mildly increased. PA peak pressure: 40 mm Hg (S).   ASSESSMENT AND PLAN [name] is an 81 yo woman with PMH of systolic and diastolic heart failure, BIV pacemaker for tachybrady syndrome, severe MR with previous MV repair, stage III chronic kidney disease, chronic atrial fibrillation on chronic anticoagulation with warfarin, moderate pulmonary hypertension and hypertension who presents with worsening heart failure symptoms and weight gain. Last known dry weight 220 lbs.  She's recently been hospitalized in [place] (last week) and had a GI bleed and a fall with hemoglobin as low as 3 and INR of 13.  1. Acute on chronic systolic and diastolic heart failure, NYHA class 3 -  Diuresed 2.3L/3L. Weight down 5lb (223->218). Last know dry weight is 220lb. Appears still volume overloaded on exam. Continue IV diuresis today. Creatinine improving.  - Daily weight and stick I&O.  - She does not tolerate beta blockers due to severe orthostatics - Echo with EF of 45-50 (improved from 2014 of 35-40%), mild diffuse hypokinesis, RV overload, mitral valve (LVOT flow): 2.09 cm^2. Mild dilated RV, PA peak pressure: 40 mm Hg (S).  2. Acute on CKD (chronic kidney disease) stage 3, GFR 30-59  ml/min - Seems baseline 1.3 to 1.5 - Creatinine improving with diuresis  3. MV disease s/p MV repair - as above  3. HTN  - Relatively stable wit low DBP  4. Chronic atrial fibrillation rate controlled - Warfarin stopped due to recent GI bleed with Hgb of 3 and INR of 13. - CHADSVASC score of 5 (age, sex, CHF, HTN) - continue Digoxin for rate control  5. Tachybrady syndrome s/p PPM with upgrade to BiVPPM  - V paced rhythm - Will get device interrogation (last device check 2 months ago)  6. Orthostatic hypotension with falls -Continue midodrine   7. Right lower extremity cellulitis - appreciate IM help.  - CRP and set rate is high - Ct of ankle nonocclusive - management per IM  8. High TSH and free T4 - likely sick euthroid, repeat thyroid function in 4 weeks. IM following  9. Anemia - Hgb improved to stable   Signed, Bhagat,Bhavinkumar PA-C Pager [phone]  Agree with note by Robbie Lis PA-C  Clinically improving. I/O 3L neg. Edema reduced. PTVPM interrogation shows chronic Afib with Optivol measurements that suggest beginning of fluid build up mid August when diuretics were adjusted. Scr coming down. Hgb stable off of coumadin. Uric Acid increased 13 and CT of ankle potentially c/w gout. Plan to start prednisone and colchicine appropriately. Will transition to PO diuretics tomorrow. Hopefully home over weekend.    Lorretta Harp, M.D., [place], [facility], Laverta [place] Keener [address] [place], [place]  [address]  [phone] 08/16/2015 12:53 PM

## 2015-08-16 NOTE — Progress Notes (Signed)
Notified by CMT that patient had 10 beats of V-Tach.  Vitals WNL. Patient reported feeling some "fluttering" in her chest which quickly resolved. MD on call text paged, will continue to monitor. Blood pressure 111/42, pulse 70, temperature 97.8 F (36.6 C), temperature source Oral, resp. rate 20, height 5\' 2"  (1.575 m), weight 99.038 kg (218 lb 5.4 oz), last menstrual period 10/10/2013, SpO2 95 %. [name]

## 2015-08-16 NOTE — Progress Notes (Signed)
VASCULAR LAB PRELIMINARY  PRELIMINARY  PRELIMINARY  PRELIMINARY  Bilateral lower extremity venous duplex and Bilateral upper extremity venous duplex completed.    Preliminary report:   Lower extremity:  Bilateral:  No evidence of DVT, superficial thrombosis, or Baker's Cyst. Upper extremity:  Bilateral:  No evidence of DVT or superficial thrombosis.  [name],Sherry, RVT 08/16/2015, 2:55 PM

## 2015-08-16 NOTE — Progress Notes (Signed)
PROGRESS NOTE  [name] [id number] DOB: 1925/12/25 DOA: 08/14/2015 PCP: Kandice Hams, MD  Brief History 81 y/o female with history of chronic combined systolic and diastolic heart failure, chronic atrial fibrillation, pacemaker placement, severe mitral valve replacement who was recently admitted in [place] for GI bleed and at that time most of patient medications were held was admitted recently for CHF exacerbation. Patient states she was just discharged from the hospital 3 days ago in [place] and started developing right foot pain and shortness of breath. Patient was admitted for CHF exacerbation. Patient states she has been having acute pain in her right foot which increased on weight bearing Assessment/Plan: Right Ankle Pain -likely Gouty arthritis flare -d/c abx -no fever or leukocytosis, doubt septic joint -uric acid--13.5 -d/c vancomycin -start prednisone Euthyroid Sick syndrome -accounts for abnormal thyroid function studies -recheck TSH, Free T4 in one month Acute on chronic systolic and diastolic CHF -Diuresis per cardiology -Daily weights -neg 5 pounds since admission CKD stage III -Baseline creatinine 1.3-1.5 -Continue to monitor with diuresis Chronic atrial fibrillation -Rate controlled -Warfarin stopped due to recent GI bleed with Hgb of 3 and INR of 13. - CHADSVASC score of 5 (age, sex, CHF, HTN) -continue digoxin for rate control Tachybrady syndrome s/p PPM with upgrade to BiVPPM  - V paced rhythm Anemia of chronic disease  -Baseline hemoglobin 9-10  -Hemoglobin is stable    Family Communication:   Daughter updated at beside Disposition Plan:   Home when medically stable       Procedures/Studies: Dg Chest 2 View  08/14/2015   CLINICAL DATA:  Shortness of breath  EXAM: CHEST  2 VIEW  COMPARISON:  February 28, 2014  FINDINGS: The heart size and mediastinal contours are stable. Heart size is enlarged. Cardiac pacemaker is identified.  Cardiac valvular replacement ring is unchanged. There is a small left pleural effusion. There is no pulmonary edema or focal pneumonia. The visualized skeletal structures are stable.  IMPRESSION: Small left pleural effusion.   Electronically Signed   By: Abelardo Diesel M.D.   On: 08/14/2015 23:13   Ct Ankle Right Wo Contrast  08/16/2015   CLINICAL DATA:  81 year old female with right ankle pain and swelling for 2 days. No known injury.  EXAM: CT OF THE RIGHT ANKLE WITHOUT CONTRAST  TECHNIQUE: Multidetector CT imaging of the right ankle was performed according to the standard protocol. Multiplanar CT image reconstructions were also generated.  COMPARISON:  No recent exams.  Radiographs from 11/28/2013 reviewed.  FINDINGS: Diffuse subcutaneous soft tissue edema and skin thickening of the included right foot and ankle. No focal fluid collection or abscess. No ankle joint effusion.  Within subcutaneous tissues about the plantar heel there is a amorphous rounded calcification measuring 2.6 x 1.9 x 1.5 cm. This extends to the skin surface. No disproportionate soft tissue stranding.  There is no fracture, dislocation, osseous erosion or periosteal reaction. No bony destructive change. Mild tibial talar joint space narrowing consistent with osteoarthritis. Scattered tarsal osteoarthritis. No erosive change or findings to suggest inflammatory arthropathy. Small plantar calcaneal spur. Moderate Achilles tendon enthesophyte.  IMPRESSION: 1. Diffuse subcutaneous soft tissue edema and skin thickening of the right ankle, nonspecific. This may reflect cellulitis in the appropriate clinical setting. Alternatively, edema or venous insufficiency could have a similar appearance. 2. Amorphous soft tissue calcifications in the plantar foot subjacent to the heel. This is nonspecific, can be seen in setting of prior hematoma.  Gout could have this appearance but is an unusual location. 3. No acute osseous abnormalities.   Electronically  Signed   By: Jeb Levering M.D.   On: 08/16/2015 00:25        Subjective:  patient complains of right ankle pain without any improvement. Denies any fevers, chills, chest pain, nausea, vomiting, diarrhea, belly pain. She has some dyspnea on exertion. Denies any headache or dizziness. No syncope.  Objective: Filed Vitals:   08/16/15 0414 08/16/15 0700 08/16/15 1100 08/16/15 1230  BP: 101/29 112/43 123/39 100/46  Pulse: 70 71 68 71  Temp: 97.7 F (36.5 C) 98.7 F (37.1 C) 98.4 F (36.9 C) 98.2 F (36.8 C)  TempSrc: Oral Oral Oral Oral  Resp: 20 20  20   Height:      Weight:      SpO2: 98% 97% 98% 95%    Intake/Output Summary (Last 24 hours) at 08/16/15 1307 Last data filed at 08/16/15 1209  Gross per 24 hour  Intake    938 ml  Output   2700 ml  Net  -1762 ml   Weight change: -2.114 kg (-4 lb 10.6 oz) Exam:   General:  Pt is alert, follows commands appropriately, not in acute distress  HEENT: No icterus, No thrush, No neck mass, [place]/AT  Cardiovascular: RRR, S1/S2, no rubs, no gallops  Respiratory: bibasilar crackles. No wheezing.   Abdomen: Soft/+BS, non tender, non distended, no guarding; no hepatosplenomegaly   Extremities: 1+ LE edema, No lymphangitis, No petechiae, No rashes, no synovitis; tenderness to palpation on the medial malleolus of the right ankle. No crepitance with active range of motion. No tenderness on the lateral malleolus. No cyanosis or clubbing   Data Reviewed: Basic Metabolic Panel:  Recent Labs Lab 08/14/15 2256 08/15/15 0240 08/16/15 0302  NA 142  --  142  K 3.8  --  3.6  CL 111  --  108  CO2 24  --  23  GLUCOSE 130*  --  87  BUN 28*  --  32*  CREATININE 1.83*  --  1.67*  CALCIUM 8.4*  --  8.4*  MG  --  1.9  --    Liver Function Tests: No results for input(s): AST, ALT, ALKPHOS, BILITOT, PROT, ALBUMIN in the last 168 hours. No results for input(s): LIPASE, AMYLASE in the last 168 hours. No results for input(s): AMMONIA in  the last 168 hours. CBC:  Recent Labs Lab 08/14/15 2256 08/16/15 0302  WBC 6.0 5.4  HGB 9.0* 8.8*  HCT 30.6* 29.0*  MCV 89.0 88.1  PLT 118* 94*   Cardiac Enzymes:  Recent Labs Lab 08/15/15 0240 08/15/15 0828 08/15/15 1355  TROPONINI 0.04* 0.04* 0.03   BNP: Invalid input(s): POCBNP CBG: No results for input(s): GLUCAP in the last 168 hours.  No results found for this or any previous visit (from the past 240 hour(s)).   Scheduled Meds: . beta carotene w/minerals  1 tablet Oral QPM  . digoxin  0.0625 mg Oral Daily  . furosemide  60 mg Intravenous BID  . loratadine  10 mg Oral Daily  . midodrine  2.5 mg Oral TID WC  . multivitamin with minerals  1 tablet Oral Daily  . pantoprazole  40 mg Oral Daily  . predniSONE  60 mg Oral Q breakfast  . sodium chloride  3 mL Intravenous Q12H   Continuous Infusions:    TAT, DAVID, DO  [facility] Pager [phone]  If 7PM-7AM, please contact night-coverage [email or web address] Password  [email or web address] 08/16/2015, 1:07 PM

## 2015-08-17 DIAGNOSIS — I10 Essential (primary) hypertension: Secondary | ICD-10-CM | POA: Diagnosis not present

## 2015-08-17 DIAGNOSIS — I472 Ventricular tachycardia: Secondary | ICD-10-CM

## 2015-08-17 DIAGNOSIS — I5042 Chronic combined systolic (congestive) and diastolic (congestive) heart failure: Secondary | ICD-10-CM

## 2015-08-17 DIAGNOSIS — N183 Chronic kidney disease, stage 3 (moderate): Secondary | ICD-10-CM | POA: Diagnosis not present

## 2015-08-17 DIAGNOSIS — M1000 Idiopathic gout, unspecified site: Secondary | ICD-10-CM

## 2015-08-17 DIAGNOSIS — I5043 Acute on chronic combined systolic (congestive) and diastolic (congestive) heart failure: Secondary | ICD-10-CM | POA: Diagnosis not present

## 2015-08-17 DIAGNOSIS — M10079 Idiopathic gout, unspecified ankle and foot: Secondary | ICD-10-CM | POA: Diagnosis not present

## 2015-08-17 DIAGNOSIS — I482 Chronic atrial fibrillation: Secondary | ICD-10-CM | POA: Diagnosis not present

## 2015-08-17 DIAGNOSIS — I4729 Other ventricular tachycardia: Secondary | ICD-10-CM | POA: Insufficient documentation

## 2015-08-17 LAB — BASIC METABOLIC PANEL
ANION GAP: 10 (ref 5–15)
BUN: 35 mg/dL — ABNORMAL HIGH (ref 6–20)
CALCIUM: 8.2 mg/dL — AB (ref 8.9–10.3)
CO2: 27 mmol/L (ref 22–32)
Chloride: 103 mmol/L (ref 101–111)
Creatinine, Ser: 1.66 mg/dL — ABNORMAL HIGH (ref 0.44–1.00)
GFR calc Af Amer: 31 mL/min — ABNORMAL LOW (ref >60)
GFR, EST NON AFRICAN AMERICAN: 27 mL/min — AB (ref >60)
GLUCOSE: 137 mg/dL — AB (ref 65–99)
Potassium: 3.2 mmol/L — ABNORMAL LOW (ref 3.5–5.1)
SODIUM: 140 mmol/L (ref 135–145)

## 2015-08-17 LAB — CBC
HCT: 27.9 % — ABNORMAL LOW (ref 36.0–46.0)
HEMOGLOBIN: 8.7 g/dL — AB (ref 12.0–15.0)
MCH: 27.6 pg (ref 26.0–34.0)
MCHC: 31.2 g/dL (ref 30.0–36.0)
MCV: 88.6 fL (ref 78.0–100.0)
Platelets: 105 10*3/uL — ABNORMAL LOW (ref 150–400)
RBC: 3.15 MIL/uL — ABNORMAL LOW (ref 3.87–5.11)
RDW: 22.4 % — AB (ref 11.5–15.5)
WBC: 3.9 10*3/uL — AB (ref 4.0–10.5)

## 2015-08-17 MED ORDER — POTASSIUM CHLORIDE CRYS ER 20 MEQ PO TBCR
20.0000 meq | EXTENDED_RELEASE_TABLET | Freq: Every day | ORAL | Status: DC
  Administered 2015-08-18: 20 meq via ORAL
  Filled 2015-08-17: qty 1

## 2015-08-17 MED ORDER — POTASSIUM CHLORIDE CRYS ER 20 MEQ PO TBCR
40.0000 meq | EXTENDED_RELEASE_TABLET | Freq: Two times a day (BID) | ORAL | Status: AC
  Administered 2015-08-17 (×2): 40 meq via ORAL
  Filled 2015-08-17 (×2): qty 2

## 2015-08-17 MED ORDER — FUROSEMIDE 40 MG PO TABS
40.0000 mg | ORAL_TABLET | Freq: Two times a day (BID) | ORAL | Status: DC
  Administered 2015-08-17 – 2015-08-18 (×2): 40 mg via ORAL
  Filled 2015-08-17 (×2): qty 1

## 2015-08-17 NOTE — Care Management Note (Signed)
Case Management Note  Patient Details  Name: [name] MRN: [id number] Date of Birth: 02/04/1926  Subjective/Objective:      Admitted with CHF              Action/Plan: Patient lives at home with daughter, had private insurance with [facility] with prescription drug coverage/ pharmacy of choice is [facility]. Patient stated that she does not have any problem getting her medication. PCP is Dr Delfina Redwood and she has an apt to see him in October. CM encouraged patient to keep her apt. Patient could benefit from the Disease Management Program for CHF. HHC choice offered, patient chose [place]. Tim with [facility] called for arrangements. Attending MD please enter the face to face document for [facility] services per Medicare requirements.  Expected Discharge Date:    possibly 08/18/2015 Expected Discharge Plan:  [place]  Discharge planning Services  CM Consult    HH Arranged:  RN, Disease Management, PT [facility] Agency:  [facility]  Status of Service:  In process, will continue to follow  [name] [id number] 08/17/2015, 10:40 AM

## 2015-08-17 NOTE — Progress Notes (Signed)
Patient had another 10 beats v-tach. Patient asymptomatic. MD on call text paged. Will continue to monitor. Tresa Endo

## 2015-08-17 NOTE — Progress Notes (Signed)
PROGRESS NOTE  NASEEM MAKHOUL [id number] DOB: Aug 11, 1926 DOA: 08/14/2015 PCP: Kandice Hams, MD    Brief History 81 y/o female with history of chronic combined systolic and diastolic heart failure, chronic atrial fibrillation, pacemaker placement, severe mitral valve replacement who was recently admitted in [place] for GI bleed and at that time most of patient medications were held was admitted recently for CHF exacerbation. Patient states she was just discharged from the hospital 3 days ago in [place] and started developing right foot pain and shortness of breath. Patient was admitted for CHF exacerbation. Patient states she has been having acute pain in her right foot which increased on weight bearing Assessment/Plan: Right Ankle Pain/gouty arthritis exacerbation -likely Gouty arthritis flare -d/c abx -no fever or leukocytosis, doubt septic joint -uric acid--13.5 -start prednisone 9/29--plan 4 more days to complete 5 days of therapy--today is day #2 (60 mg daily) -Significant improvement with 1 day of prednisone -Would recommend starting allopurinol approximately 1-2 weeks after completion of therapy for acute exacerbation -Bilateral lower extremity duplex negative for DVT Euthyroid Sick syndrome -accounts for abnormal thyroid function studies -recheck TSH, Free T4 in one month Acute on chronic systolic and diastolic CHF -Diuresis per cardiology -Daily weights -neg 10 pounds since admission -NEG 4.7L CKD stage III -Baseline creatinine 1.3-1.5 -Continue to monitor with diuresis Chronic atrial fibrillation -Rate controlled -Warfarin stopped due to recent GI bleed with Hgb of 3 and INR of 13. - CHADSVASC score of 5 (age, sex, CHF, HTN) -continue digoxin for rate control Tachybrady syndrome s/p PPM with upgrade to BiVPPM  - V paced rhythm Anemia of chronic disease  -Baseline hemoglobin 9-10  -Hemoglobin is stable    Family Communication: Daughter updated  at beside 9/29 Disposition Plan: per cardiology   Procedures/Studies: Dg Chest 2 View  08/14/2015   CLINICAL DATA:  Shortness of breath  EXAM: CHEST  2 VIEW  COMPARISON:  February 28, 2014  FINDINGS: The heart size and mediastinal contours are stable. Heart size is enlarged. Cardiac pacemaker is identified. Cardiac valvular replacement ring is unchanged. There is a small left pleural effusion. There is no pulmonary edema or focal pneumonia. The visualized skeletal structures are stable.  IMPRESSION: Small left pleural effusion.   Electronically Signed   By: Abelardo Diesel M.D.   On: 08/14/2015 23:13   Ct Ankle Right Wo Contrast  08/16/2015   CLINICAL DATA:  81 year old female with right ankle pain and swelling for 2 days. No known injury.  EXAM: CT OF THE RIGHT ANKLE WITHOUT CONTRAST  TECHNIQUE: Multidetector CT imaging of the right ankle was performed according to the standard protocol. Multiplanar CT image reconstructions were also generated.  COMPARISON:  No recent exams.  Radiographs from 11/28/2013 reviewed.  FINDINGS: Diffuse subcutaneous soft tissue edema and skin thickening of the included right foot and ankle. No focal fluid collection or abscess. No ankle joint effusion.  Within subcutaneous tissues about the plantar heel there is a amorphous rounded calcification measuring 2.6 x 1.9 x 1.5 cm. This extends to the skin surface. No disproportionate soft tissue stranding.  There is no fracture, dislocation, osseous erosion or periosteal reaction. No bony destructive change. Mild tibial talar joint space narrowing consistent with osteoarthritis. Scattered tarsal osteoarthritis. No erosive change or findings to suggest inflammatory arthropathy. Small plantar calcaneal spur. Moderate Achilles tendon enthesophyte.  IMPRESSION: 1. Diffuse subcutaneous soft tissue edema and skin thickening of the right ankle, nonspecific. This may reflect cellulitis  in the appropriate clinical setting. Alternatively, edema  or venous insufficiency could have a similar appearance. 2. Amorphous soft tissue calcifications in the plantar foot subjacent to the heel. This is nonspecific, can be seen in setting of prior hematoma. Gout could have this appearance but is an unusual location. 3. No acute osseous abnormalities.   Electronically Signed   By: Jeb Levering M.D.   On: 08/16/2015 00:25        Subjective: Patient states that right ankle pain is 50% better than yesterday. She is able to bear weight on it. She denies fevers, chest, chest discomfort, nausea, vomiting, diarrhea, vomiting, dysuria. She still has some dyspnea on exertion. Breathing is much better.  Objective: Filed Vitals:   08/16/15 1629 08/16/15 1956 08/16/15 2221 08/17/15 0300  BP: 105/29 124/48 111/42 114/53  Pulse: 70 70 70 70  Temp:  98.1 F (36.7 C) 97.8 F (36.6 C) 97.9 F (36.6 C)  TempSrc:  Oral Oral Oral  Resp:  20 20 20   Height:      Weight:    96.861 kg (213 lb 8.6 oz)  SpO2:  95% 95%     Intake/Output Summary (Last 24 hours) at 08/17/15 0834 Last data filed at 08/17/15 0750  Gross per 24 hour  Intake   1060 ml  Output   2950 ml  Net  -1890 ml   Weight change: -2.177 kg (-4 lb 12.8 oz) Exam:   General:  Pt is alert, follows commands appropriately, not in acute distress  HEENT: No icterus, No thrush, No neck mass, [place]/AT  Cardiovascular: RRR, S1/S2, no rubs, no gallops  Respiratory: Fine bibasilar crackles. No wheezing  Abdomen: Soft/+BS, non tender, non distended, no guarding  Extremities: 1+LE edema, No lymphangitis, No petechiae, No rashes, no synovitis; right ankle erythema resolved.  Right lower extremity without erythema, crepitance, draining wounds  Data Reviewed: Basic Metabolic Panel:  Recent Labs Lab 08/14/15 2256 08/15/15 0240 08/16/15 0302 08/17/15 0341  NA 142  --  142 140  K 3.8  --  3.6 3.2*  CL 111  --  108 103  CO2 24  --  23 27  GLUCOSE 130*  --  87 137*  BUN 28*  --  32* 35*    CREATININE 1.83*  --  1.67* 1.66*  CALCIUM 8.4*  --  8.4* 8.2*  MG  --  1.9  --   --    Liver Function Tests: No results for input(s): AST, ALT, ALKPHOS, BILITOT, PROT, ALBUMIN in the last 168 hours. No results for input(s): LIPASE, AMYLASE in the last 168 hours. No results for input(s): AMMONIA in the last 168 hours. CBC:  Recent Labs Lab 08/14/15 2256 08/16/15 0302 08/17/15 0341  WBC 6.0 5.4 3.9*  HGB 9.0* 8.8* 8.7*  HCT 30.6* 29.0* 27.9*  MCV 89.0 88.1 88.6  PLT 118* 94* 105*   Cardiac Enzymes:  Recent Labs Lab 08/15/15 0240 08/15/15 0828 08/15/15 1355  TROPONINI 0.04* 0.04* 0.03   BNP: Invalid input(s): POCBNP CBG: No results for input(s): GLUCAP in the last 168 hours.  Recent Results (from the past 240 hour(s))  Culture, blood (routine x 2)     Status: None (Preliminary result)   Collection Time: 08/15/15 12:19 AM  Result Value Ref Range Status   Specimen Description BLOOD RIGHT HAND  Final   Special Requests BOTTLES DRAWN AEROBIC AND ANAEROBIC 5CC  Final   Culture NO GROWTH 1 DAY  Final   Report Status PENDING  Incomplete  Culture, blood (routine x 2)     Status: None (Preliminary result)   Collection Time: 08/15/15 12:25 AM  Result Value Ref Range Status   Specimen Description BLOOD RIGHT HAND  Final   Special Requests BOTTLES DRAWN AEROBIC AND ANAEROBIC 5CC  Final   Culture NO GROWTH 1 DAY  Final   Report Status PENDING  Incomplete     Scheduled Meds: . beta carotene w/minerals  1 tablet Oral QPM  . digoxin  0.0625 mg Oral Daily  . furosemide  60 mg Intravenous BID  . loratadine  10 mg Oral Daily  . midodrine  2.5 mg Oral TID WC  . multivitamin with minerals  1 tablet Oral Daily  . pantoprazole  40 mg Oral Daily  . predniSONE  60 mg Oral Q breakfast  . sodium chloride  3 mL Intravenous Q12H   Continuous Infusions:    TAT, DAVID, DO  [facility] Pager [phone]  If 7PM-7AM, please contact night-coverage [email or web address] Password  [facility] 08/17/2015, 8:34 AM

## 2015-08-17 NOTE — Progress Notes (Signed)
Patient Name: [name] Date of Encounter: 08/17/2015  Principal Problem:   Acute on chronic systolic and diastolic heart failure, NYHA class 3 Active Problems:   Chronic anticoagulation   CKD (chronic kidney disease) stage 3, GFR 30-59 ml/min   Anemia   Chronic a-fib   Hypertension   Acute on chronic combined systolic and diastolic congestive heart failure, NYHA class 3   Abnormal thyroid function test   Right ankle pain   History of gout   Ankle pain   Gout flare   Primary Cardiologist: Dr. Radford Pax Patient Profile: 81 yo female w/ PMH of systolic and diastolic heart failure, BIV pacemaker for tachybrady syndrome, HTN severe MR with previous MV repair, stage III CKD, chronic A-fib (on Coumadin), and moderate pulm HTN presenting with worsening heart failure symptoms and weight gain. Last known dry weight 220 lbs. Hospitalized in [place] (last week) and had a GI bleed w/ Hgb as low as 3 and INr of 13.  SUBJECTIVE: Reports her breathing has improved. Has attempted walking up and down the hallway but is limited by the pain in her right foot. Denies any chest pain. Reports having some palpitations last night around the time when she was noted to have 10 beats of VT. Reports the palpitations happen about once a week or less at home.  OBJECTIVE Filed Vitals:   08/16/15 1629 08/16/15 1956 08/16/15 2221 08/17/15 0300  BP: 105/29 124/48 111/42 114/53  Pulse: 70 70 70 70  Temp:  98.1 F (36.7 C) 97.8 F (36.6 C) 97.9 F (36.6 C)  TempSrc:  Oral Oral Oral  Resp:  20 20 20   Height:      Weight:    213 lb 8.6 oz (96.861 kg)  SpO2:  95% 95%     Intake/Output Summary (Last 24 hours) at 08/17/15 1011 Last data filed at 08/17/15 0948  Gross per 24 hour  Intake   1122 ml  Output   2800 ml  Net  -1678 ml   Filed Weights   08/15/15 0207 08/16/15 0308 08/17/15 0300  Weight: 223 lb 3.2 oz (101.243 kg) 218 lb 5.4 oz (99.038 kg) 213 lb 8.6 oz (96.861 kg)    PHYSICAL  EXAM General: Well developed, well nourished, female in no acute distress. Head: Normocephalic, atraumatic.  Neck: Supple without bruits, JVD not elevated. Lungs:  Resp regular and unlabored, CTA without wheezing or rales. Heart: RRR, S1, S2, no S3, S4, or murmur; no rub. Abdomen: Soft, non-tender, non-distended with normoactive bowel sounds. No hepatomegaly. No rebound/guarding. No obvious abdominal masses. Extremities: No clubbing, no cyanosis, trace edema. Distal pedal pulses are 2+ bilaterally. Neuro: Alert and oriented X 3. Moves all extremities spontaneously. Psych: Normal affect.   LABS: CBC:  Recent Labs  08/16/15 0302 08/17/15 0341  WBC 5.4 3.9*  HGB 8.8* 8.7*  HCT 29.0* 27.9*  MCV 88.1 88.6  PLT 94* 105*   INR:  Recent Labs  08/14/15 2256  INR [id number]*   Basic Metabolic Panel:  Recent Labs  08/15/15 0240 08/16/15 0302 08/17/15 0341  NA  --  142 140  K  --  3.6 3.2*  CL  --  108 103  CO2  --  23 27  GLUCOSE  --  87 137*  BUN  --  32* 35*  CREATININE  --  1.67* 1.66*  CALCIUM  --  8.4* 8.2*  MG 1.9  --   --    Cardiac Enzymes:  Recent Labs  08/15/15 0240 08/15/15 0828 08/15/15 1355  TROPONINI 0.04* 0.04* 0.03    Recent Labs  08/14/15 2254  TROPIPOC 0.04   BNP:  B NATRIURETIC PEPTIDE  Date/Time Value Ref Range Status  08/14/2015 10:57 PM 391.3* 0.0 - 100.0 pg/mL Final   Thyroid Function Tests:  Recent Labs  08/15/15 0240  TSH 5.202*   TELE:  V-paced with rate in 60-70's.  10 beats of VT last night (2200).  Another 10 beats of VT this morning (0500)   ECHO: Study Conclusions - Left ventricle: The cavity size was normal. Wall thickness was normal. Systolic function was mildly reduced. The estimated ejection fraction was in the range of 45% to 50%. Mild diffuse hypokinesis with no identifiable regional variations. - Ventricular septum: Septal motion showed paradox. The contour showed diastolic flattening. These changes are  consistent with RV volume overload. - Mitral valve: Prior procedures included surgical repair. An annular ring prosthesis was present. There was mild regurgitation. Valve area by continuity equation (using LVOT flow): 2.09 cm^2. - Left atrium: The atrium was severely dilated. - Right ventricle: The cavity size was mildly dilated. Wall thickness was normal. Systolic function was mildly reduced. - Right atrium: The atrium was severely dilated. - Tricuspid valve: There was severe regurgitation. A diagnosis of severe regurgitation is supported by hepatic vein systolic flow reversal. - Pulmonary arteries: Systolic pressure was mildly increased. PA peak pressure: 40 mm Hg (S).  Radiology/Studies: Ct Ankle Right Wo Contrast: 08/16/2015   CLINICAL DATA:  81 year old female with right ankle pain and swelling for 2 days. No known injury.  EXAM: CT OF THE RIGHT ANKLE WITHOUT CONTRAST  TECHNIQUE: Multidetector CT imaging of the right ankle was performed according to the standard protocol. Multiplanar CT image reconstructions were also generated.  COMPARISON:  No recent exams.  Radiographs from 11/28/2013 reviewed.  FINDINGS: Diffuse subcutaneous soft tissue edema and skin thickening of the included right foot and ankle. No focal fluid collection or abscess. No ankle joint effusion.  Within subcutaneous tissues about the plantar heel there is a amorphous rounded calcification measuring 2.6 x 1.9 x 1.5 cm. This extends to the skin surface. No disproportionate soft tissue stranding.  There is no fracture, dislocation, osseous erosion or periosteal reaction. No bony destructive change. Mild tibial talar joint space narrowing consistent with osteoarthritis. Scattered tarsal osteoarthritis. No erosive change or findings to suggest inflammatory arthropathy. Small plantar calcaneal spur. Moderate Achilles tendon enthesophyte.  IMPRESSION: 1. Diffuse subcutaneous soft tissue edema and skin thickening of  the right ankle, nonspecific. This may reflect cellulitis in the appropriate clinical setting. Alternatively, edema or venous insufficiency could have a similar appearance. 2. Amorphous soft tissue calcifications in the plantar foot subjacent to the heel. This is nonspecific, can be seen in setting of prior hematoma. Gout could have this appearance but is an unusual location. 3. No acute osseous abnormalities.   Electronically Signed   By: Jeb Levering M.D.   On: 08/16/2015 00:25     Current Medications:  . beta carotene w/minerals  1 tablet Oral QPM  . digoxin  0.0625 mg Oral Daily  . furosemide  40 mg Oral BID  . loratadine  10 mg Oral Daily  . midodrine  2.5 mg Oral TID WC  . multivitamin with minerals  1 tablet Oral Daily  . pantoprazole  40 mg Oral Daily  . potassium chloride  40 mEq Oral BID  . predniSONE  60 mg Oral Q breakfast  . sodium chloride  3  mL Intravenous Q12H      ASSESSMENT AND PLAN:  1. Acute on chronic systolic and diastolic heart failure, NYHA class 3 - last echo is 2014 with EF of 35-40%, PA peak pressure: 25mm Hg (S), mild mitral reguar. Echo on 08/15/2015 shows EF of 45% to 50%. Mild diffuse hypokinesis with no identifiable regional variations. Mild MR, severely dilated left atrium, mildly dilated right ventricle severe tricuspid regurg, and PA peak pressure: 40 mm Hg (S). - BNP 391 on 08/14/2015. Last known dry weight was 220lbs, 223 on admission,  213lbs on 08/17/2015. - Given IV Lasix 80mg  once, now on 60mg  IV BID. Will transition to PO Lasix 40mg  BID today. - Net output of -4.7L since admission on 08/14/2015. - Daily weight and strict I&O.  - She does not tolerate beta blockers due to severe orthostatics.  2. Acute on CKD (chronic kidney disease) stage 3, GFR 30-59 ml/min - Seems baseline is between 1.3 to 1.5. - elevated to 1.66 on 08/17/2015.  3. MV disease s/p MV repair - repair in 2003  3. HTN  - BP has been 94/33 - 124/53 in the past 24  hours.  4. Chronic atrial fibrillation rate controlled - Warfarin stopped due to recent GI bleed with Hgb of 3 and INR of 13. - CHADSVASC score of 5 (age, sex, CHF, HTN) - continue Digoxin for rate control  5. Tachybrady syndrome s/p PPM with upgrade to BiVPPM  - V paced rhythm - PTVPM interrogation showed chronic Afib with Optivol measurements that suggest beginning of fluid build up mid August when diuretics were adjusted  6. Orthostatic hypotension with falls -Continue midodrine   7. Right Ankle Pain - Given IV Vancomycin in ED for concern of cellulitis. Stopped for now. Awaiting blood cultures. No growth in 1 day thus far. - Lower Extremity Dopplers negative for DVT. - thought to be a gouty arthritis flare. On Prednisone for 5 days of therapy. Reports improvement in her symptoms. - IM recommends Allopurinol 1-2 weeks after completion of therapy for acute exacerbation.  8. Euthyroid Sick Syndrome - IM recommends rechecking TSH and free T4 in one month.  9. Anemia - Hgb 8.7 on 08/17/2015.  10. Hypokalemia - potassium 3.2 on 08/17/2015. - replacing. Need to consider possible home potassium supplementation at time of discharge if she will be leaving with PO Lasix.   Signed, Erma Heritage , PA-C 10:11 AM 08/17/2015 Pager: [phone]  Agree with note by Bernerd Pho PA-C  Good diuresis. Clinically improved. Lungs clear. Periph edema better. K low ---> replete. She had a 10 beat run of NSVT ? Secondary to low K.   Transition to PO lasix today. Re check labs in AM. Prednisone and cochicine for Gout right ankle. Possible D/C home AM. No coumadin secondary to recent life threatening GIB. ROV with Dr [name].  Lorretta Harp, M.D., [place], [facility], [place] [address] [place], [place]  [id number]  [phone] 08/17/2015 10:56 AM

## 2015-08-17 NOTE — Progress Notes (Signed)
Physical Therapy Treatment Patient Details Name: [name] MRN: [id number] DOB: 16-Aug-1926 Today's Date: 08/17/2015    History of Present Illness [name] is an 81 yo woman with PMH of systolic and diastolic heart failure, BIV pacemaker for tachybrady syndrome, severe MR with previous MV repair, stage III chronic kidney disease, chronic atrial fibrillation on chronic anticoagulation with warfarin, moderate pulmonary hypertension and hypertension who presents with worsening heart failure symptoms and weight gain. Last known dry weight 220 lbs. She's recently been hospitalized in [place] (last week) and had a GI bleed and a fall with hemoglobin as low as 3 and INr of 13.    PT Comments    Pt admitted with above diagnosis. Pt currently with functional limitations due to balance and endurance deficits as well as right LE pain. Pt ambulating better with RW with overall good safety.  HHPT appropriate for pt and she has needed equipment on d/c.  Pt will benefit from skilled PT to increase their independence and safety with mobility to allow discharge to the venue listed below.    Follow Up Recommendations  Home health PT;Supervision - Intermittent     Equipment Recommendations  None recommended by PT    Recommendations for Other Services       Precautions / Restrictions Precautions Precautions: Fall Restrictions Weight Bearing Restrictions: No    Mobility  Bed Mobility               General bed mobility comments: Pt sitting on EOB on arrival.   Transfers Overall transfer level: Needs assistance Equipment used: Rolling walker (2 wheeled) Transfers: Sit to/from Stand Sit to Stand: Supervision         General transfer comment: No asssist or cues to power up.  Steady to RW.    Ambulation/Gait Ambulation/Gait assistance: Supervision Ambulation Distance (Feet): 90 Feet Assistive device: Rolling walker (2 wheeled) Gait Pattern/deviations: Step-through pattern;Decreased  stride length;Trunk flexed;Wide base of support   Gait velocity interpretation: Below normal speed for age/gender General Gait Details: Pt standing more upright than previous attempts.  Pt ambulating safely with RW.  Still slow but no LOB.  Demonstrates good safety with RW overall.       Stairs            Wheelchair Mobility    Modified Rankin (Stroke Patients Only)       Balance Overall balance assessment: Needs assistance Sitting-balance support: No upper extremity supported;Feet supported Sitting balance-Leahy Scale: Good     Standing balance support: During functional activity;Single extremity supported Standing balance-Leahy Scale: Poor Standing balance comment: Pt can stand statically with bil UE support.  Stopped at sink on way back to bed from walk in hallway with pt brushing dentures.  Pt needed one cue to keep RW close to sink as she tended to push it away.  Cues for safety.               High level balance activites: Direction changes;Turns;Sudden stops High Level Balance Comments: above with min guard assist with RW.     Cognition Arousal/Alertness: Awake/alert Behavior During Therapy: WFL for tasks assessed/performed Overall Cognitive Status: Within Functional Limits for tasks assessed                      Exercises General Exercises - Lower Extremity Ankle Circles/Pumps: AROM;Both;15 reps;Seated Long Arc Quad: AROM;Both;10 reps;Seated Hip Flexion/Marching: AROM;Both;10 reps;Seated    General Comments        Pertinent Vitals/Pain Pain  Assessment: 0-10 Pain Score: 6  Pain Location: right foot Pain Descriptors / Indicators: Aching;Sore Pain Intervention(s): Limited activity within patient's tolerance;Monitored during session;Premedicated before session;Repositioned  VSS    Home Living                      Prior Function            PT Goals (current goals can now be found in the care plan section) Progress towards PT  goals: Progressing toward goals    Frequency  Min 3X/week    PT Plan Current plan remains appropriate    Co-evaluation             End of Session Equipment Utilized During Treatment: Gait belt Activity Tolerance: Patient limited by fatigue Patient left: in bed;with call bell/phone within reach;with bed alarm set (sitting on EOB )     Time: [id number] PT Time Calculation (min) (ACUTE ONLY): 23 min  Charges:  $Gait Training: 8-22 mins $Therapeutic Exercise: 8-22 mins                    G CodesIrwin Brakeman F 2015/09/06, 10:01 AM  [facility] [phone] (pager)

## 2015-08-18 DIAGNOSIS — R946 Abnormal results of thyroid function studies: Secondary | ICD-10-CM | POA: Diagnosis not present

## 2015-08-18 DIAGNOSIS — Z7901 Long term (current) use of anticoagulants: Secondary | ICD-10-CM | POA: Diagnosis not present

## 2015-08-18 DIAGNOSIS — L03115 Cellulitis of right lower limb: Secondary | ICD-10-CM | POA: Diagnosis not present

## 2015-08-18 DIAGNOSIS — I5043 Acute on chronic combined systolic (congestive) and diastolic (congestive) heart failure: Secondary | ICD-10-CM | POA: Diagnosis not present

## 2015-08-18 DIAGNOSIS — N183 Chronic kidney disease, stage 3 (moderate): Secondary | ICD-10-CM | POA: Diagnosis not present

## 2015-08-18 DIAGNOSIS — M10071 Idiopathic gout, right ankle and foot: Secondary | ICD-10-CM

## 2015-08-18 DIAGNOSIS — I482 Chronic atrial fibrillation: Secondary | ICD-10-CM | POA: Diagnosis not present

## 2015-08-18 LAB — BASIC METABOLIC PANEL
Anion gap: 10 (ref 5–15)
BUN: 44 mg/dL — AB (ref 6–20)
CALCIUM: 8.2 mg/dL — AB (ref 8.9–10.3)
CO2: 27 mmol/L (ref 22–32)
Chloride: 104 mmol/L (ref 101–111)
Creatinine, Ser: 1.54 mg/dL — ABNORMAL HIGH (ref 0.44–1.00)
GFR calc Af Amer: 34 mL/min — ABNORMAL LOW (ref >60)
GFR, EST NON AFRICAN AMERICAN: 29 mL/min — AB (ref >60)
Glucose, Bld: 136 mg/dL — ABNORMAL HIGH (ref 65–99)
Potassium: 3.5 mmol/L (ref 3.5–5.1)
Sodium: 141 mmol/L (ref 135–145)

## 2015-08-18 LAB — CBC
HEMATOCRIT: 27.7 % — AB (ref 36.0–46.0)
Hemoglobin: 8.6 g/dL — ABNORMAL LOW (ref 12.0–15.0)
MCH: 27.5 pg (ref 26.0–34.0)
MCHC: 31 g/dL (ref 30.0–36.0)
MCV: 88.5 fL (ref 78.0–100.0)
Platelets: 119 10*3/uL — ABNORMAL LOW (ref 150–400)
RBC: 3.13 MIL/uL — ABNORMAL LOW (ref 3.87–5.11)
RDW: 21.9 % — AB (ref 11.5–15.5)
WBC: 4.8 10*3/uL (ref 4.0–10.5)

## 2015-08-18 LAB — DIGOXIN LEVEL: DIGOXIN LVL: 0.4 ng/mL — AB (ref 0.8–2.0)

## 2015-08-18 MED ORDER — PREDNISONE 20 MG PO TABS: 60.0000 mg | ORAL_TABLET | Freq: Every day | ORAL | Status: DC

## 2015-08-18 MED ORDER — ALLOPURINOL 100 MG PO TABS: 100.0000 mg | ORAL_TABLET | Freq: Every day | ORAL | Status: DC

## 2015-08-18 MED ORDER — ADULT MULTIVITAMIN W/MINERALS CH: 1.0000 | ORAL_TABLET | Freq: Every day | ORAL | Status: DC

## 2015-08-18 MED ORDER — FUROSEMIDE 40 MG PO TABS: 40.0000 mg | ORAL_TABLET | Freq: Two times a day (BID) | ORAL | Status: DC

## 2015-08-18 MED ORDER — DOXYCYCLINE HYCLATE 100 MG PO TABS: 100.0000 mg | ORAL_TABLET | Freq: Two times a day (BID) | ORAL | Status: DC

## 2015-08-18 MED ORDER — DOXYCYCLINE HYCLATE 100 MG PO TABS
100.0000 mg | ORAL_TABLET | Freq: Two times a day (BID) | ORAL | Status: DC
Start: 2015-08-18 — End: 2015-08-18
  Administered 2015-08-18: 100 mg via ORAL
  Filled 2015-08-18: qty 1

## 2015-08-18 NOTE — Progress Notes (Signed)
PROGRESS NOTE  [name] [id number] DOB: [birth year] DOA: 08/14/2015 PCP: Kandice Hams, MD   Brief History 81 y/o female with history of chronic combined systolic and diastolic heart failure, chronic atrial fibrillation, pacemaker placement, severe mitral valve replacement who was recently admitted in [place] for GI bleed and at that time most of patient medications were held was admitted recently for CHF exacerbation. Patient states she was just discharged from the hospital 3 days ago in [place] and started developing right foot pain and shortness of breath. Patient was admitted for CHF exacerbation. Patient states she has been having acute pain in her right foot which increased on weight bearing Assessment/Plan: Right Ankle Pain/gouty arthritis exacerbation -likely Gouty arthritis flare -d/c abx -no fever or leukocytosis, doubt septic joint -uric acid--13.5 -start prednisone 9/29--plan 2 more days (after today) to complete 5 days of therapy--today is day #3 (60 mg daily) -Continued improvement with prednisone -Would recommend starting allopurinol approximately 1-2 weeks after completion of therapy for acute exacerbation -Bilateral lower extremity duplex negative for DVT Cellulitis right leg -mild erythema has returned today above ankle to mid-pretibial region -start doxycycline--plan 7 days  Euthyroid Sick syndrome -accounts for abnormal thyroid function studies -recheck TSH, Free T4 in one month Acute on chronic systolic and diastolic CHF -Diuresis per cardiology -Daily weights -neg 10 pounds since admission -NEG 6.1L CKD stage III -Baseline creatinine 1.3-1.5 -Continue to monitor with diuresis Chronic atrial fibrillation -Rate controlled -Warfarin stopped due to recent GI bleed with Hgb of 3 and INR of 13. - CHADSVASC score of 5 (age, sex, CHF, HTN) -continue digoxin for rate control Tachybrady syndrome s/p PPM with upgrade to BiVPPM  - V paced  rhythm Anemia of chronic disease  -Baseline hemoglobin 9-10  -Hemoglobin is stable    Family Communication: Daughter updated at beside 9/29 Disposition Plan: per cardiology   Procedures/Studies: Dg Chest 2 View  08/14/2015   CLINICAL DATA:  Shortness of breath  EXAM: CHEST  2 VIEW  COMPARISON:  February 28, 2014  FINDINGS: The heart size and mediastinal contours are stable. Heart size is enlarged. Cardiac pacemaker is identified. Cardiac valvular replacement ring is unchanged. There is a small left pleural effusion. There is no pulmonary edema or focal pneumonia. The visualized skeletal structures are stable.  IMPRESSION: Small left pleural effusion.   Electronically Signed   By: Abelardo Diesel M.D.   On: 08/14/2015 23:13   Ct Ankle Right Wo Contrast  08/16/2015   CLINICAL DATA:  81 year old female with right ankle pain and swelling for 2 days. No known injury.  EXAM: CT OF THE RIGHT ANKLE WITHOUT CONTRAST  TECHNIQUE: Multidetector CT imaging of the right ankle was performed according to the standard protocol. Multiplanar CT image reconstructions were also generated.  COMPARISON:  No recent exams.  Radiographs from 11/28/2013 reviewed.  FINDINGS: Diffuse subcutaneous soft tissue edema and skin thickening of the included right foot and ankle. No focal fluid collection or abscess. No ankle joint effusion.  Within subcutaneous tissues about the plantar heel there is a amorphous rounded calcification measuring 2.6 x 1.9 x 1.5 cm. This extends to the skin surface. No disproportionate soft tissue stranding.  There is no fracture, dislocation, osseous erosion or periosteal reaction. No bony destructive change. Mild tibial talar joint space narrowing consistent with osteoarthritis. Scattered tarsal osteoarthritis. No erosive change or findings to suggest inflammatory arthropathy. Small plantar calcaneal spur. Moderate Achilles tendon enthesophyte.  IMPRESSION: 1. Diffuse  subcutaneous soft tissue edema and  skin thickening of the right ankle, nonspecific. This may reflect cellulitis in the appropriate clinical setting. Alternatively, edema or venous insufficiency could have a similar appearance. 2. Amorphous soft tissue calcifications in the plantar foot subjacent to the heel. This is nonspecific, can be seen in setting of prior hematoma. Gout could have this appearance but is an unusual location. 3. No acute osseous abnormalities.   Electronically Signed   By: Jeb Levering M.D.   On: 08/16/2015 00:25         Subjective: Patient says that right ankle pain is continuing to improve. She denies any fevers, chills, chest pain, shortness breath, nausea, vomiting, diarrhea, pain. No headaches. No dysuria or hematuria. Denies any rashes  Objective: Filed Vitals:   08/17/15 1000 08/17/15 1229 08/17/15 2001 08/18/15 0435  BP: 117/46 115/51 125/47 118/51  Pulse: 68 70 70 73  Temp:  97.5 F (36.4 C) 98.3 F (36.8 C) 98 F (36.7 C)  TempSrc:  Oral Oral Oral  Resp:  20 20 18   Height:      Weight:    96.843 kg (213 lb 8 oz)  SpO2:  100% 96% 96%    Intake/Output Summary (Last 24 hours) at 08/18/15 0751 Last data filed at 08/18/15 0631  Gross per 24 hour  Intake   1255 ml  Output   2675 ml  Net  -1420 ml   Weight change: -0.018 kg (-0.6 oz) Exam:   General:  Pt is alert, follows commands appropriately, not in acute distress  HEENT: No icterus, No thrush, No neck mass, [place]/AT  Cardiovascular: RRR, S1/S2, no rubs, no gallops  Respiratory: Bibasilar crackles. No wheezing. Her movement.  Abdomen: Soft/+BS, non tender, non distended, no guarding  Extremities: 1+LE edema, No lymphangitis, No petechiae, No rashes, no synovitis  Data Reviewed: Basic Metabolic Panel:  Recent Labs Lab 08/14/15 2256 08/15/15 0240 08/16/15 0302 08/17/15 0341 08/18/15 0351  NA 142  --  142 140 141  K 3.8  --  3.6 3.2* 3.5  CL 111  --  108 103 104  CO2 24  --  23 27 27   GLUCOSE 130*  --  87 137*  136*  BUN 28*  --  32* 35* 44*  CREATININE 1.83*  --  1.67* 1.66* 1.54*  CALCIUM 8.4*  --  8.4* 8.2* 8.2*  MG  --  1.9  --   --   --    Liver Function Tests: No results for input(s): AST, ALT, ALKPHOS, BILITOT, PROT, ALBUMIN in the last 168 hours. No results for input(s): LIPASE, AMYLASE in the last 168 hours. No results for input(s): AMMONIA in the last 168 hours. CBC:  Recent Labs Lab 08/14/15 2256 08/16/15 0302 08/17/15 0341 08/18/15 0351  WBC 6.0 5.4 3.9* 4.8  HGB 9.0* 8.8* 8.7* 8.6*  HCT 30.6* 29.0* 27.9* 27.7*  MCV 89.0 88.1 88.6 88.5  PLT 118* 94* 105* 119*   Cardiac Enzymes:  Recent Labs Lab 08/15/15 0240 08/15/15 0828 08/15/15 1355  TROPONINI 0.04* 0.04* 0.03   BNP: Invalid input(s): POCBNP CBG: No results for input(s): GLUCAP in the last 168 hours.  Recent Results (from the past 240 hour(s))  Culture, blood (routine x 2)     Status: None (Preliminary result)   Collection Time: 08/15/15 12:19 AM  Result Value Ref Range Status   Specimen Description BLOOD RIGHT HAND  Final   Special Requests BOTTLES DRAWN AEROBIC AND ANAEROBIC 5CC  Final   Culture  NO GROWTH 2 DAYS  Final   Report Status PENDING  Incomplete  Culture, blood (routine x 2)     Status: None (Preliminary result)   Collection Time: 08/15/15 12:25 AM  Result Value Ref Range Status   Specimen Description BLOOD RIGHT HAND  Final   Special Requests BOTTLES DRAWN AEROBIC AND ANAEROBIC 5CC  Final   Culture NO GROWTH 2 DAYS  Final   Report Status PENDING  Incomplete     Scheduled Meds: . beta carotene w/minerals  1 tablet Oral QPM  . digoxin  0.0625 mg Oral Daily  . furosemide  40 mg Oral BID  . loratadine  10 mg Oral Daily  . midodrine  2.5 mg Oral TID WC  . multivitamin with minerals  1 tablet Oral Daily  . pantoprazole  40 mg Oral Daily  . potassium chloride  20 mEq Oral Daily  . predniSONE  60 mg Oral Q breakfast  . sodium chloride  3 mL Intravenous Q12H   Continuous Infusions:     Arrionna Serena, DO  [facility] Pager [phone]  If 7PM-7AM, please contact night-coverage [email or web address] Password [email or web address] 08/18/2015, 7:51 AM

## 2015-08-18 NOTE — Discharge Summary (Signed)
Physician Discharge Summary      Cardiologist: Turner Patient ID: [name] MRN: [id number] DOB/AGE: July 05, 1926 81 y.o.  Admit date: 08/14/2015 Discharge date: 08/18/2015  Admission Diagnoses: Acute on chronic systolic and diastolic heart failure NYHA class III  Discharge Diagnoses:  Principal Problem:   Acute on chronic systolic and diastolic heart failure, NYHA class 3 Active Problems:   Chronic anticoagulation   CKD (chronic kidney disease) stage 3, GFR 30-59 ml/min   Anemia   Chronic a-fib   Hypertension   Acute on chronic combined systolic and diastolic congestive heart failure, NYHA class 3   Abnormal thyroid function test   Right ankle pain   History of gout   Ankle pain   Gout flare   NSVT (nonsustained ventricular tachycardia)   Cellulitis of leg, right   Discharged Condition: stable  Hospital Course:   [name] is an 81 yo woman with PMH of systolic and diastolic heart failure, BIV pacemaker for tachybrady syndrome, severe MR with previous MV repair, stage III chronic kidney disease, chronic atrial fibrillation on chronic anticoagulation with warfarin, moderate pulmonary hypertension and hypertension who presents with worsening heart failure symptoms with every day activities as well as 7 lb weight gain. She's recently been hospitalized in [place] and had a GI bleed and a fall with hemoglobin as low as 3 and INR of 13. She had all of her medications stopped.  Otherwise, she denies fever/chills/nausea/vomiting/diarrhea.  She was last seen by Dr. Radford Pax July 2016.   She has leg edema, more dyspnea with exertion, bending over and walking any distances. She is still taking digoxin and midodrine and torsemide but she is off metoprolol and warfarin given fall and GI bleed. She denies current frank syncope or chest pain. No infectious symptoms but some itching and right leg redness.    Patient was admitted and started on IV Lasix 40 mg twice a day. She was not  anticoagulated due to recent falls and serious GI bleed. - CHADSVASC score of 5 (age, sex, CHF, HTN)Digoxin was continued for rate control. Midrin added for orthostatic hypotension.  Vancomycin was continued initially for suspected cellulitis.  Internal medicine was consult for right ankle pain and abnormal thyroid function studies.  Uric acid-13, sedimentation rate, CRP.   Patient was started on prednisone and colchicine.  She'll continue 60 mg of prednisone for 5 days and then it was recommended she start allopurinol proximal 1-2 weeks after that.  Antibiotic discontinued.  Dr. Hal Hope thought the patient's thyroid function tests were most consistent with sick euthyroid and recommended repeating the studies in 4 weeks.  CT scan of the right ankle showed diffuse subcutaneous soft tissue edema and skin thickening, amorphous soft tissue calcifications in the plantar foot subjacent to the heel gout could have this appearance but is an unusual location.  No acute osseous abnormalities. Interrogation of the patient's pacemaker showed chronic A. fib with optivol measurements which suggested beginning of fluid buildup mid-August.   Patient had bilateral lower and upper extremity venous duplexes completed with showed no evidence of sun DVT or superficial thrombosis.  Patient had 10 beats of nonsustained VT. She reported some fluttering in her chest which quickly resolved.  Patient ultimately diuresed 6.1 L. She was changed to by mouth Lasix 40 mg twice a day.  She does not tolerate bland beta blockers due to severe orthostatics. She'll be scheduled for a transition of care follow-up. Intermittent home health PT supervision was recommended by physical therapy Patient was seen  by Dr. Rayann Heman who thought she was stable for discharge home.    Allopurinol was written to start on October 10   Consults: Internal medicine, physical therapy  Significant Diagnostic Studies:  CT OF THE RIGHT ANKLE WITHOUT  CONTRAST  TECHNIQUE: Multidetector CT imaging of the right ankle was performed according to the standard protocol. Multiplanar CT image reconstructions were also generated.  COMPARISON: No recent exams. Radiographs from 11/28/2013 reviewed.  FINDINGS: Diffuse subcutaneous soft tissue edema and skin thickening of the included right foot and ankle. No focal fluid collection or abscess. No ankle joint effusion.  Within subcutaneous tissues about the plantar heel there is a amorphous rounded calcification measuring 2.6 x 1.9 x 1.5 cm. This extends to the skin surface. No disproportionate soft tissue stranding.  There is no fracture, dislocation, osseous erosion or periosteal reaction. No bony destructive change. Mild tibial talar joint space narrowing consistent with osteoarthritis. Scattered tarsal osteoarthritis. No erosive change or findings to suggest inflammatory arthropathy. Small plantar calcaneal spur. Moderate Achilles tendon enthesophyte.  IMPRESSION: 1. Diffuse subcutaneous soft tissue edema and skin thickening of the right ankle, nonspecific. This may reflect cellulitis in the appropriate clinical setting. Alternatively, edema or venous insufficiency could have a similar appearance. 2. Amorphous soft tissue calcifications in the plantar foot subjacent to the heel. This is nonspecific, can be seen in setting of prior hematoma. Gout could have this appearance but is an unusual location. 3. No acute osseous abnormalities.  CHEST 2 VIEW  COMPARISON: February 28, 2014  FINDINGS: The heart size and mediastinal contours are stable. Heart size is enlarged. Cardiac pacemaker is identified. Cardiac valvular replacement ring is unchanged. There is a small left pleural effusion. There is no pulmonary edema or focal pneumonia. The visualized skeletal structures are stable.  IMPRESSION: Small left pleural effusion.  Treatments: See above  Discharge  Exam: Blood pressure 115/46, pulse 69, temperature 97.6 F (36.4 C), temperature source Oral, resp. rate 18, height 5\' 2"  (1.575 m), weight 213 lb 8 oz (96.843 kg), last menstrual period 10/10/2013, SpO2 96 %.   Disposition: 01-Home or Self Care      Discharge Instructions    Diet - low sodium heart healthy    Complete by:  As directed      Discharge instructions    Complete by:  As directed   Monitor your weight every morning.  If you gain 3 pounds in 24 hours, or 5 pounds in a week, call the office for instructions.     Increase activity slowly    Complete by:  As directed             Medication List    STOP taking these medications        ciprofloxacin 500 MG tablet  Commonly known as:  CIPRO     colchicine 0.6 MG tablet     metoprolol succinate 25 MG 24 hr tablet  Commonly known as:  TOPROL-XL     torsemide 20 MG tablet  Commonly known as:  DEMADEX     warfarin 5 MG tablet  Commonly known as:  COUMADIN      TAKE these medications        allopurinol 100 MG tablet  Commonly known as:  ZYLOPRIM  Take 1 tablet (100 mg total) by mouth daily.  Start taking on:  08/27/2015     beta carotene w/minerals tablet  Take 1 tablet by mouth every evening.     cetirizine 10 MG tablet  Commonly  known as:  ZYRTEC  Take 10 mg by mouth daily as needed for allergies.     digoxin 0.125 MG tablet  Commonly known as:  LANOXIN  TAKE 0.5 TABLETS (0.0625 MG TOTAL) BY MOUTH DAILY.     furosemide 40 MG tablet  Commonly known as:  LASIX  Take 1 tablet (40 mg total) by mouth 2 (two) times daily.     KLOR-CON M20 20 MEQ tablet  Generic drug:  potassium chloride SA  TAKE 1 TABLET EVERY DAY     midodrine 2.5 MG tablet  Commonly known as:  PROAMATINE  TAKE 1 TABLET THREE TIMES A DAY WITH MEALS     multivitamin with minerals Tabs tablet  Take 1 tablet by mouth daily.     pantoprazole 40 MG tablet  Commonly known as:  PROTONIX  Take 40 mg by mouth daily.     polyethylene  glycol packet  Commonly known as:  MIRALAX / GLYCOLAX  Take 17 g by mouth daily.     predniSONE 20 MG tablet  Commonly known as:  DELTASONE  Take 3 tablets (60 mg total) by mouth daily with breakfast.     sucralfate 1 GM/10ML suspension  Commonly known as:  CARAFATE  Take 1 g by mouth 4 (four) times daily -  with meals and at bedtime.       Follow-up Information    Follow up with Sueanne Margarita, MD.   Specialty:  Cardiology   Why:  The office will call you with your follow up appointment date and time   Contact information:   [address] [place] [address] [phone]      Greater than 30 minutes was spent completing the patient's discharge.   Signed: Addysen Louth pac 08/18/2015, 12:16 PM

## 2015-08-18 NOTE — Progress Notes (Signed)
Patient discharge to home accompanied by patient's daughter and NT via wheelchair. Discharge instructions given. Patient and daughter  verbalizes understanding. No signs and symptoms of respiratory distress noted. All personal belongings given. Denies any pain. Patient's stable.

## 2015-08-18 NOTE — Progress Notes (Signed)
SUBJECTIVE: The patient is doing well today.  SOB is improved.  Cellulitis improving.  Feels that she is stable for discharge. At this time, she denies chest pain or any new concerns.  . beta carotene w/minerals  1 tablet Oral QPM  . digoxin  0.0625 mg Oral Daily  . doxycycline  100 mg Oral Q12H  . furosemide  40 mg Oral BID  . loratadine  10 mg Oral Daily  . midodrine  2.5 mg Oral TID WC  . multivitamin with minerals  1 tablet Oral Daily  . pantoprazole  40 mg Oral Daily  . potassium chloride  20 mEq Oral Daily  . predniSONE  60 mg Oral Q breakfast  . sodium chloride  3 mL Intravenous Q12H      OBJECTIVE: Physical Exam: Filed Vitals:   08/17/15 2001 08/18/15 0435 08/18/15 0919 08/18/15 0925  BP: 125/47 118/51  115/46  Pulse: 70 73 69   Temp: 98.3 F (36.8 C) 98 F (36.7 C)  97.6 F (36.4 C)  TempSrc: Oral Oral  Oral  Resp: 20 18  18   Height:      Weight:  213 lb 8 oz (96.843 kg)    SpO2: 96% 96%  96%    Intake/Output Summary (Last 24 hours) at 08/18/15 1137 Last data filed at 08/18/15 1038  Gross per 24 hour  Intake   1318 ml  Output   2525 ml  Net  -1207 ml    Telemetry reveals afib  GEN- The patient is elderly, overweight appearing, alert and oriented x 3 today.   Head- normocephalic, atraumatic Eyes-  Sclera clear, conjunctiva pink Ears- hearing intact Oropharynx- clear Neck- supple,   Lungs- Clear to ausculation bilaterally, normal work of breathing Heart- Regular rate and rhythm  (Paced) GI- soft, NT, ND, + BS Extremities- no clubbing, cyanosis,+ dependant edema, improved cellulitis Skin- no rash or lesion Psych- euthymic mood, full affect Neuro- strength and sensation are intact  LABS: Basic Metabolic Panel:  Recent Labs  08/17/15 0341 08/18/15 0351  NA 140 141  K 3.2* 3.5  CL 103 104  CO2 27 27  GLUCOSE 137* 136*  BUN 35* 44*  CREATININE 1.66* 1.54*  CALCIUM 8.2* 8.2*   Liver Function Tests: No results for input(s): AST, ALT,  ALKPHOS, BILITOT, PROT, ALBUMIN in the last 72 hours. No results for input(s): LIPASE, AMYLASE in the last 72 hours. CBC:  Recent Labs  08/17/15 0341 08/18/15 0351  WBC 3.9* 4.8  HGB 8.7* 8.6*  HCT 27.9* 27.7*  MCV 88.6 88.5  PLT 105* 119*   Cardiac Enzymes:  Recent Labs  08/15/15 1355  TROPONINI 0.03    ASSESSMENT AND PLAN:   1. Acute on chronic systolic and diastolic heart failure, NYHA class 3 - clinically improving and stable on oral lasix - She does not tolerate beta blockers due to severe orthostatics.  2. Acute on CKD (chronic kidney disease) stage 3, GFR 30-59 ml/min - Seems baseline is between 1.3 to 1.5.  3. MV disease s/p MV repair - repair in 2003  3. HTN  Stable No change required today  4. Chronic atrial fibrillation rate controlled - Warfarin stopped due to recent GI bleed with Hgb of 3 and INR of 13. - CHADSVASC score of 5 (age, sex, CHF, HTN) - continue Digoxin for rate control - not a good candidate for anticoagulation at this time,  Would reassess in the outpatient setting if creatinine/ anemia remain stable  5. Tachybrady syndrome  s/p PPM with upgrade to BiVPPM  - V paced rhythm  6. Orthostatic hypotension with falls -Continue midodrine   7. Right Ankle Pain Appreciated IM assistance  8. Euthyroid Sick Syndrome - IM recommends rechecking TSH and free T4 in one month.  9. Anemia -hb now stable   Anticipate discharge to home later today Case management assisting with home needs  Will need transition of care appointment for next week  Thompson Grayer, MD 08/18/2015 11:37 AM

## 2015-08-20 ENCOUNTER — Telehealth: Payer: Self-pay | Admitting: Cardiology

## 2015-08-20 LAB — CULTURE, BLOOD (ROUTINE X 2)
Culture: NO GROWTH
Culture: NO GROWTH

## 2015-08-20 NOTE — Telephone Encounter (Signed)
D/C phone Call .[name] Appt on 09/03/15 at 1:30 w/ Mickel Baas at [facility] office   Thanks

## 2015-08-20 NOTE — Telephone Encounter (Signed)
New message       TCM appt on 08-24-15 with Bonney Leitz

## 2015-08-20 NOTE — Telephone Encounter (Signed)
Patient contacted regarding discharge from [facility] on 08/18/15  Patient understands to follow up with provider laura ingold np on 09/03/15 at 1:30 pm at [address] Patient understands discharge instructions? yes Patient understands medications and regiment? yes Patient understands to bring all medications to this visit? yes

## 2015-08-21 NOTE — Telephone Encounter (Signed)
Patient contacted regarding discharge from [facility] on 08/18/2015.    Patient understands to follow up with provider Marita Snellen, PA on Friday, 08/24/2015 at 10:00 am at [facility] office.   Patient understands discharge instructions?  Yes  Patient understands medications and regiment?  Yes  Patient understands to bring all medications to this visit?  Yes   Patient did have concerns about the amount of Lasix she was taking.  Advised to continue per her discharge instructions for now.  Demadex stopped in hospital & put back on Lasix.  She will discuss further at [place] visit on Friday.

## 2015-08-22 ENCOUNTER — Telehealth: Payer: Self-pay | Admitting: Cardiology

## 2015-08-22 ENCOUNTER — Other Ambulatory Visit: Payer: Self-pay

## 2015-08-22 NOTE — Telephone Encounter (Signed)
New Message     Pt states she was told to let us know if she lost more than 3 lbs, pt states he has lost 5 lbs in 4 days and now weighs 207 lbs. Please call back and advise.

## 2015-08-22 NOTE — Telephone Encounter (Signed)
Patient called c/o 4-5 lb weight loss since D/C from hospital 10/1. Patient has been drinking water and food habits have not changed. She does not remember recent BPs, but st they "are fine." Her HR is around 70. She st she feels great and does not feel dehydrated. Patient has OV with Nell Range, PA, this Friday. Instructed patient to bring list of weights, BPs and HRs to appointment for her to review. Instructed patient to call prior to appointment if she continues to lose weight or if she becomes symptomatic. Patient agrees with treatment plan.

## 2015-08-23 ENCOUNTER — Telehealth: Payer: Self-pay | Admitting: Cardiology

## 2015-08-23 NOTE — Telephone Encounter (Signed)
New message      Calling to get verbal order for physical therapy 2 times a week for 4 weeks.

## 2015-08-23 NOTE — Progress Notes (Signed)
Cardiology Office Note    Date:  08/24/2015   ID:  [name], DOB [birth year], MRN [id number]  PCP:  Sherry Hams, MD  Cardiologist:  Dr. Radford [name] EP: Dr [name]   CC: post hosp follow up for acute on chronic CHF   History of Present Illness: [name] is a 81 y.o. female with PMH of systolic and diastolic heart failure, BIV pacemaker for tachybrady syndrome, severe MR with previous MV repair, stage III chronic kidney disease, chronic atrial fibrillation on chronic anticoagulation with warfarin, moderate pulmonary hypertension and hypertension who presents for post hospital follow up.  She was recently been hospitalized in [place] and had a GI bleed and a fall with hemoglobin as low as 3 and INR of 13. She had all of her medications stopped including metoprolol and warfarin given fall and GI bleed.   She was admitted from 9/27-10/1/16 for acute on chronic combined S/D CHF. She was placed on IV Lasix with good diuresis. Internal medicine was consult for right ankle pain and abnormal thyroid function studies. Uric acid-13. Patient was started on prednisone and colchicine. She'll continue 60 mg of prednisone for 5 days and then it was recommended she start allopurinol proximal 1-2 weeks after that. Antibiotic discontinued. Dr. Hal [name] thought the patient's thyroid function tests were most consistent with sick euthyroid and recommended repeating the studies in 4 weeks. CT scan of the right ankle showed diffuse subcutaneous soft tissue edema and skin thickening, amorphous soft tissue calcifications in the plantar foot subjacent to the heel gout could have this appearance but is an unusual location. No acute osseous abnormalities. Interrogation of the patient's pacemaker showed chronic A. fib with optivol measurements which suggested beginning of fluid buildup mid-August. Patient had bilateral lower and upper extremity venous duplexes completed with showed no evidence of sun DVT  or superficial thrombosis. Patient ultimately diuresed 6.1 L. She was changed to by mouth Lasix 40 mg twice a day. She does not tolerate bland beta blockers due to severe orthostatics.  Allopurinol was written to start on October 10   Today she presents for post hospital follow up. She is doing well. No complaints except for left foot gout flare. No CP, SOB, orthopnea, PND, palpitations, dizziness or syncope.   Studies:  - Echo  (08/15/15) w/ EF 45-50%, mild diffuse HK, MV s/p repair mild MR, severe LA dilation, mild RV dilation, severe TR, PA pressure 40   Recent Labs/Images:   Recent Labs  08/14/15 2257 08/15/15 0240  08/18/15 0351  NA  --   --   < > 141  K  --   --   < > 3.5  BUN  --   --   < > 44*  CREATININE  --   --   < > 1.54*  HGB  --   --   < > 8.6*  TSH  --  5.202*  --   --   BNP 391.3*  --   --   --   < > = values in this interval not displayed.   Dg Chest 2 View  08/14/2015   CLINICAL DATA:  Shortness of breath  EXAM: CHEST  2 VIEW  COMPARISON:  February 28, 2014  FINDINGS: The heart size and mediastinal contours are stable. Heart size is enlarged. Cardiac pacemaker is identified. Cardiac valvular replacement ring is unchanged. There is a small left pleural effusion. There is no pulmonary edema or focal pneumonia. The visualized skeletal structures are stable.  IMPRESSION: Small left pleural effusion.   Electronically Signed   By: [name] M.D.   On: 08/14/2015 23:13     Wt Readings from Last 3 Encounters:  08/24/15 207 lb 3.2 oz (93.985 kg)  08/18/15 213 lb 8 oz (96.843 kg)  06/13/15 219 lb 12.8 oz (99.701 kg)     Past Medical History  Diagnosis Date  . Mitral valve disorder     a. Severe MR s/p repair 2003 (28 mm annuloplasty ring and and oversew of LAA). No CAD by cath at that time.  . Pacemaker     a. 2003: post-op afib after MR repair then symptomatic bradycardia requiring pacemaker. b. Upgrade to Medtronic Bi-V Pacemaker 2007.  [name] Cirrhosis ([place])      a. Newly recognized 09/2013 - by CT.  . CKD (chronic kidney disease)   . Pulmonary hypertension, mild (HCC)     secondary to mild/mod LV dysfunction(EF 40%)  . Diastolic dysfunction   . Spinal stenosis     shes recieved epidural steroid injections in the past  . Chronic combined systolic and diastolic CHF (congestive heart failure) ([place])     EF 35-40% on echo 09/2013  . Chronic atrial fibrillation ([place])     a. Previously failed DCCV/amio/tikosyn  . NSVT (nonsustained ventricular tachycardia) ([place])     a. Isolated event during 09/2007 adm.  . Brady-tachy syndrome [facility])     s/p permanent pacemaker placement  . Hypertension   . GI bleeding   . Shortness of breath dyspnea   . Cellulitis and abscess of foot 08/15/2015    RT FOOT    Current Outpatient Prescriptions  Medication Sig Dispense Refill  . beta carotene w/minerals (OCUVITE) tablet Take 1 tablet by mouth every evening.     . cetirizine (ZYRTEC) 10 MG tablet Take 10 mg by mouth daily as needed for allergies.     [name] digoxin (LANOXIN) 0.125 MG tablet TAKE 0.5 TABLETS (0.0625 MG TOTAL) BY MOUTH DAILY. 90 tablet 1  . furosemide (LASIX) 40 MG tablet Take 1 tablet (40 mg total) by mouth 2 (two) times daily. 30 tablet 11  . midodrine (PROAMATINE) 2.5 MG tablet Take 2.5 mg by mouth 3 (three) times daily with meals.    . Multiple Vitamin (MULTIVITAMIN WITH MINERALS) TABS tablet Take 1 tablet by mouth daily.    . pantoprazole (PROTONIX) 40 MG tablet Take 40 mg by mouth daily.    . potassium chloride SA (K-DUR,KLOR-CON) 20 MEQ tablet Take 20 mEq by mouth daily.    Derrill Memo ON 08/27/2015] allopurinol (ZYLOPRIM) 100 MG tablet Take 1 tablet (100 mg total) by mouth daily. (Patient not taking: Reported on 08/24/2015) 30 tablet 1   No current facility-administered medications for this visit.     Allergies:   Review of patient's allergies indicates no known allergies.   Social History:  The patient  reports that she quit smoking about 43 years  ago. She has never used smokeless tobacco. She reports that she drinks alcohol. She reports that she does not use illicit drugs.   Family History:  [name] family history includes Other in her mother; Pancreatic cancer in her father.   ROS:  Please see the history of present illness.  All other systems reviewed and negative.    PHYSICAL EXAM: VS:  BP 112/38 mmHg  Pulse 77  Ht 5\' 2"  (1.575 m)  Wt 207 lb 3.2 oz (93.985 kg)  BMI 37.89 kg/m2  LMP 10/10/2013 Well nourished, well developed,  in no acute distressobese HEENT: normal Neck: no JVD Cardiac:  normal S1, S2; RRR; no murmur Lungs: clear to auscultation bilaterally, no wheezing, rhonchi or rales Abd: soft, nontender, no hepatomegaly Ext: no edema Skin: warm and dry Neuro:  CNs 2-12 intact, no focal abnormalities noted  EKG:  V paced HR 77. Occasional PVC     ASSESSMENT AND PLAN:  [name] is a 81 y.o. female with PMH of systolic and diastolic heart failure, BIV pacemaker for tachybrady syndrome, severe MR with previous MV repair, stage III chronic kidney disease, chronic atrial fibrillation on chronic anticoagulation with warfarin, moderate pulmonary hypertension and hypertension who presents for post hospital follow up.  A/C mixed S/D CHF - she is doing very well today. No s/s CHF. -- 2D ECHO (08/15/15) w/ EF 45-50%, mild diffuse HK, MV s/p repair mild MR, severe LA dilation, mild RV dilation, severe TR, PA pressure 40 -- Continue Lasix 40mg  BID and Kdur 64mEq qd.   Chronic Afib -- Anticoagulation discontinued due to recent falls and serious GI bleed. - CHADSVASC score of 5 (age, sex, CHF, HTN) -- Continue Digoxin for rate control. BB discontinue due to recent falls and severe orthostasis   Orthostatic hypotension- well controlled today and since discharge  -- Continue midodrine 2.5mg  and TED hose. BB discontinued.   Gout -- Patient was started on prednisone and colchicine. She'll continue 60 mg of prednisone  for 5 days and then it was recommended she start allopurinol proximal 1-2 weeks after that. Her foot it still hurting her. She called her PCP and they are waiting for a call back  Sick euthyroid-  Will need repeat thyroid studies in 3 weeks per PCP.    S/p MVR- 2D ECHO with stable repair. Mild MR.   CKD- creat 1.54 at discharge. Will check a BMET today  Tachybrady syndrome s/p PPM with upgrade to BiVPPM. Interrogated during hospital. Functioning normally.   Disposition:  FU with Dr [name]    Signed, Sherry Mixer, PA-C, MHS 08/24/2015 10:35 AM    [address], [place], [place]  [address] Phone: [phone]; Fax: [phone]

## 2015-08-23 NOTE — Telephone Encounter (Signed)
Instructed [name] to call PCP for order. [name] agrees with treatment plan.

## 2015-08-24 ENCOUNTER — Ambulatory Visit (INDEPENDENT_AMBULATORY_CARE_PROVIDER_SITE_OTHER): Payer: Medicare Other | Admitting: Physician Assistant

## 2015-08-24 ENCOUNTER — Encounter: Payer: Self-pay | Admitting: Physician Assistant

## 2015-08-24 VITALS — BP 112/38 | HR 77 | Ht 62.0 in | Wt 207.2 lb

## 2015-08-24 DIAGNOSIS — N183 Chronic kidney disease, stage 3 (moderate): Secondary | ICD-10-CM

## 2015-08-24 DIAGNOSIS — I5042 Chronic combined systolic (congestive) and diastolic (congestive) heart failure: Secondary | ICD-10-CM | POA: Diagnosis not present

## 2015-08-24 LAB — BASIC METABOLIC PANEL
BUN: 38 mg/dL — AB (ref 7–25)
CALCIUM: 8.2 mg/dL — AB (ref 8.6–10.4)
CO2: 25 mmol/L (ref 20–31)
Chloride: 107 mmol/L (ref 98–110)
Creat: 1.44 mg/dL — ABNORMAL HIGH (ref 0.60–0.88)
GLUCOSE: 92 mg/dL (ref 65–99)
POTASSIUM: 3.9 mmol/L (ref 3.5–5.3)
Sodium: 145 mmol/L (ref 135–146)

## 2015-08-24 NOTE — Patient Instructions (Signed)
Medication Instructions:   Your physician recommends that you continue on your current medications as directed. Please refer to the Current Medication list given to you today.  Labwork:  BMET TODAY   Testing/Procedures:  NONE ORDER TODAY  Follow-Up:  WITH DR TURNER NEXT AVAILABLE BEFORE 6 MONTHS RECALL DATE   Any Other Special Instructions Will Be Listed Below (If Applicable).

## 2015-09-03 ENCOUNTER — Encounter: Payer: Medicare Other | Admitting: Cardiology

## 2015-09-06 ENCOUNTER — Telehealth: Payer: Self-pay | Admitting: *Deleted

## 2015-09-06 NOTE — Telephone Encounter (Signed)
-----   Message from Eileen Stanford, PA-C sent at 08/28/2015  7:42 AM EDT ----- Creat slowly improving. No change in plans. Thanks!

## 2015-10-08 ENCOUNTER — Other Ambulatory Visit: Payer: Self-pay | Admitting: Cardiology

## 2015-11-01 ENCOUNTER — Ambulatory Visit (INDEPENDENT_AMBULATORY_CARE_PROVIDER_SITE_OTHER): Payer: Medicare Other | Admitting: Cardiology

## 2015-11-01 ENCOUNTER — Encounter: Payer: Self-pay | Admitting: Cardiology

## 2015-11-01 VITALS — BP 128/80 | HR 81 | Ht 62.0 in | Wt 200.8 lb

## 2015-11-01 DIAGNOSIS — I482 Chronic atrial fibrillation, unspecified: Secondary | ICD-10-CM

## 2015-11-01 DIAGNOSIS — I5042 Chronic combined systolic (congestive) and diastolic (congestive) heart failure: Secondary | ICD-10-CM | POA: Diagnosis not present

## 2015-11-01 DIAGNOSIS — I059 Rheumatic mitral valve disease, unspecified: Secondary | ICD-10-CM

## 2015-11-01 DIAGNOSIS — I10 Essential (primary) hypertension: Secondary | ICD-10-CM

## 2015-11-01 DIAGNOSIS — I951 Orthostatic hypotension: Secondary | ICD-10-CM

## 2015-11-01 NOTE — Progress Notes (Signed)
Cardiology Office Note   Date:  11/01/2015   ID:  [name], DOB [birth year], MRN [id number]  PCP:  Kandice Hams, MD    Chief Complaint  Patient presents with  . Congestive Heart Failure  . Atrial Fibrillation      History of Present Illness: [name] is a pleasant 81 yo woman with a h/o chronic well compensated combined systolic/diastolic CHF, tachy brady syndrome s/p BiV PPM insertion, severe MR s/p MV repair, CKD, chronic atrial fibrillation, HTN and moderate pulmonary HTN who presents today for followup. Her dry weight is 200 lbs.She is doing well. She denies any chest pain, SOB, DOE, dizziness, palpitations or syncope. Her LE edema has significantly improved.    Past Medical History  Diagnosis Date  . Mitral valve disorder     a. Severe MR s/p repair 2003 (28 mm annuloplasty ring and and oversew of LAA). No CAD by cath at that time.  . Pacemaker     a. 2003: post-op afib after MR repair then symptomatic bradycardia requiring pacemaker. b. Upgrade to Medtronic Bi-V Pacemaker 2007.  [name] Cirrhosis ([place])     a. Newly recognized 09/2013 - by CT.  . CKD (chronic kidney disease)   . Pulmonary hypertension, mild (HCC)     secondary to mild/mod LV dysfunction(EF 40%)  . Diastolic dysfunction   . Spinal stenosis     shes recieved epidural steroid injections in the past  . Chronic combined systolic and diastolic CHF (congestive heart failure) ([place])     EF 35-40% on echo 09/2013  . Chronic atrial fibrillation ([place])     a. Previously failed DCCV/amio/tikosyn  . NSVT (nonsustained ventricular tachycardia) ([place])     a. Isolated event during 09/2007 adm.  . Brady-tachy syndrome [facility])     s/p permanent pacemaker placement  . Hypertension   . GI bleeding   . Shortness of breath dyspnea   . Cellulitis and abscess of foot 08/15/2015    RT FOOT    Past Surgical History  Procedure Laterality Date  . Valve replacement      sever mitral  regurgitation s/p mitral valve annuloplasty ring  . Cholecystectomy    . Appendectomy    . Irrigation and debridement abscess N/A 10/14/2013    Procedure: IRRIGATION AND DEBRIDEMENT PERINEAL ABSCESS;  Surgeon: Rolm Bookbinder, MD;  Location: [place];  Service: General;  Laterality: N/A;  . Biv pacemaker generator change out N/A 12/13/2014    Procedure: BIV PACEMAKER GENERATOR CHANGE OUT;  Surgeon: Evans Lance, MD;  Location: [facility] CATH LAB;  Service: Cardiovascular;  Laterality: N/A;     Current Outpatient Prescriptions  Medication Sig Dispense Refill  . beta carotene w/minerals (OCUVITE) tablet Take 1 tablet by mouth every evening.     . cetirizine (ZYRTEC) 10 MG tablet Take 10 mg by mouth daily as needed for allergies.     [name] digoxin (LANOXIN) 0.125 MG tablet TAKE 0.5 TABLETS (0.0625 MG TOTAL) BY MOUTH DAILY. 90 tablet 1  . furosemide (LASIX) 40 MG tablet Take 1 tablet (40 mg total) by mouth 2 (two) times daily. 30 tablet 11  . midodrine (PROAMATINE) 2.5 MG tablet TAKE 1 TABLET THREE TIMES A DAY WITH MEALS 90 tablet 6  . Multiple Vitamin (MULTIVITAMIN WITH MINERALS) TABS tablet Take 1 tablet by mouth daily.    . pantoprazole (PROTONIX) 40 MG tablet Take 40 mg by mouth  daily.    . potassium chloride SA (K-DUR,KLOR-CON) 20 MEQ tablet Take 1 tablet by mouth  daily 90 tablet 1  . allopurinol (ZYLOPRIM) 100 MG tablet Take 1 tablet (100 mg total) by mouth daily. (Patient not taking: Reported on 11/01/2015) 30 tablet 1   No current facility-administered medications for this visit.    Allergies:   Review of patient's allergies indicates no known allergies.    Social History:  The patient  reports that she quit smoking about 44 years ago. She has never used smokeless tobacco. She reports that she drinks alcohol. She reports that she does not use illicit drugs.   Family History:  [name] family history includes Other in her mother; Pancreatic cancer in her father.    ROS:  Please see the  history of present illness.   Otherwise, review of systems are positive for none.   All other systems are reviewed and negative.    PHYSICAL EXAM: VS:  BP 128/80 mmHg  Pulse 81  Ht 5\' 2"  (1.575 m)  Wt 200 lb 12.8 oz (91.082 kg)  BMI 36.72 kg/m2  SpO2 98%  LMP 10/10/2013 , BMI Body mass index is 36.72 kg/(m^2). GEN: Well nourished, well developed, in no acute distress HEENT: normal Neck: no JVD, carotid bruits, or masses Cardiac: RRR; no murmurs, rubs, or gallops,no edema  Respiratory:  clear to auscultation bilaterally, normal work of breathing GI: soft, nontender, nondistended, + BS MS: no deformity or atrophy Skin: warm and dry, no rash Neuro:  Strength and sensation are intact Psych: euthymic mood, full affect   EKG:  EKG is not ordered today.    Recent Labs: 08/14/2015: B Natriuretic Peptide 391.3* 08/15/2015: Magnesium 1.9; TSH 5.202* 08/18/2015: Hemoglobin 8.6*; Platelets 119* 08/24/2015: BUN 38*; Creat 1.44*; Potassium 3.9; Sodium 145    Lipid Panel No results found for: CHOL, TRIG, HDL, CHOLHDL, VLDL, LDLCALC, LDLDIRECT    Wt Readings from Last 3 Encounters:  11/01/15 200 lb 12.8 oz (91.082 kg)  08/24/15 207 lb 3.2 oz (93.985 kg)  08/18/15 213 lb 8 oz (96.843 kg)    ASSESSMENT AND PLAN:  1. Chronic combined systolic/diastolic CHF - She does not tolerate beta blockers due to severe orthostasis. She is following a low sodium diet. Her goal dry weight is 200 lbs. She appears euvolemic on exam today. Check BMET  2. MV disease s/p MV repair 3. HTN - controlled off BP meds 4. Chronic atrial fibrillation rate controlled - continue Digoxin/warfarin 5. Tachybrady syndrome s/p PPM with upgrade to BiVPPM  6. Orthostatic hypotension with falls - Will continue midodrine 2.5mg  TID along with TED hose stockings  7. CKD stage 3  Current medicines are reviewed at length with the patient today.  The patient does not have concerns regarding  medicines.  The following changes have been made:  no change  Labs/ tests ordered today: See above Assessment and Plan No orders of the defined types were placed in this encounter.     Disposition:   FU with me in 6 months  Signed, Sueanne Margarita, MD  11/01/2015 3:54 PM    [facility] [address], [place], [place]  [address] Phone: [phone]; Fax: [phone]

## 2015-11-01 NOTE — Patient Instructions (Signed)

## 2015-11-29 ENCOUNTER — Telehealth: Payer: Self-pay | Admitting: Cardiology

## 2015-11-29 NOTE — Telephone Encounter (Signed)
New message     Calling to confirm heart failure diagnosis.  If pt has this diagnosis, she will fax ejection fraction request form to [place].

## 2015-12-03 ENCOUNTER — Other Ambulatory Visit: Payer: Self-pay | Admitting: *Deleted

## 2015-12-03 MED ORDER — FUROSEMIDE 40 MG PO TABS: 40.0000 mg | ORAL_TABLET | Freq: Two times a day (BID) | ORAL | Status: DC

## 2015-12-19 ENCOUNTER — Encounter: Payer: Self-pay | Admitting: *Deleted

## 2016-01-10 ENCOUNTER — Encounter: Payer: Self-pay | Admitting: Internal Medicine

## 2016-01-10 ENCOUNTER — Ambulatory Visit (INDEPENDENT_AMBULATORY_CARE_PROVIDER_SITE_OTHER): Payer: Medicare Other | Admitting: Internal Medicine

## 2016-01-10 VITALS — BP 128/60 | HR 100 | Ht 62.0 in | Wt 200.4 lb

## 2016-01-10 DIAGNOSIS — I4819 Other persistent atrial fibrillation: Secondary | ICD-10-CM

## 2016-01-10 DIAGNOSIS — I481 Persistent atrial fibrillation: Secondary | ICD-10-CM | POA: Diagnosis not present

## 2016-01-10 DIAGNOSIS — I482 Chronic atrial fibrillation, unspecified: Secondary | ICD-10-CM

## 2016-01-10 DIAGNOSIS — I5022 Chronic systolic (congestive) heart failure: Secondary | ICD-10-CM

## 2016-01-10 DIAGNOSIS — I5042 Chronic combined systolic (congestive) and diastolic (congestive) heart failure: Secondary | ICD-10-CM

## 2016-01-10 LAB — CUP PACEART INCLINIC DEVICE CHECK
Implantable Lead Implant Date: 20030908
Implantable Lead Implant Date: 20071114
Implantable Lead Location: 753858
Implantable Lead Location: 753860
Implantable Lead Model: 5076
MDC IDC LEAD IMPLANT DT: 20030908
MDC IDC LEAD LOCATION: 753859
MDC IDC LEAD MODEL: 4194
MDC IDC SESS DTM: 20170223171551

## 2016-01-10 NOTE — Patient Instructions (Addendum)
Medication Instructions:  Your physician recommends that you continue on your current medications as directed. Please refer to the Current Medication list given to you today.   Labwork: None Ordered   Testing/Procedures: None Ordered   Follow-Up: Your physician wants you to follow-up in: 6 months for device check and 12 months with Dr. Lovena Le.  You will receive a reminder letter in the mail two months in advance. If you don't receive a letter, please call our office to schedule the follow-up appointment.   If you need a refill on your cardiac medications before your next appointment, please call your pharmacy.   Thank you for choosing [facility]! Christen Bame, RN [phone]

## 2016-01-10 NOTE — Progress Notes (Signed)
HPI [name] returns today for followup. She is a pleasant 82 yo woman with a h/o CHB, chronic atrial fibrillation and orthostasis.  She denies chest pain, sob, or syncope. She has undergone biv PM gen change. In the interim she has been stable with no syncope. She has done well over the past few months. No sob.  No other complaints.  No Known Allergies   Current Outpatient Prescriptions  Medication Sig Dispense Refill  . beta carotene w/minerals (OCUVITE) tablet Take 1 tablet by mouth every evening.     . cetirizine (ZYRTEC) 10 MG tablet Take 10 mg by mouth daily as needed for allergies.     [name] digoxin (LANOXIN) 0.125 MG tablet TAKE 0.5 TABLETS (0.0625 MG TOTAL) BY MOUTH DAILY. 90 tablet 1  . furosemide (LASIX) 40 MG tablet Take 1 tablet (40 mg total) by mouth 2 (two) times daily. 90 tablet 2  . midodrine (PROAMATINE) 2.5 MG tablet TAKE 1 TABLET BY MOUTH THREE TIMES A DAY WITH MEALS    . Multiple Vitamin (MULTIVITAMIN WITH MINERALS) TABS tablet Take 1 tablet by mouth daily.    . pantoprazole (PROTONIX) 40 MG tablet Take 40 mg by mouth daily.    . potassium chloride SA (K-DUR,KLOR-CON) 20 MEQ tablet Take 1 tablet by mouth  daily 90 tablet 1   No current facility-administered medications for this visit.     Past Medical History  Diagnosis Date  . Mitral valve disorder     a. Severe MR s/p repair 2003 (28 mm annuloplasty ring and and oversew of LAA). No CAD by cath at that time.  . Pacemaker     a. 2003: post-op afib after MR repair then symptomatic bradycardia requiring pacemaker. b. Upgrade to Medtronic Bi-V Pacemaker 2007.  [name] Cirrhosis ([place])     a. Newly recognized 09/2013 - by CT.  . CKD (chronic kidney disease)   . Pulmonary hypertension, mild (HCC)     secondary to mild/mod LV dysfunction(EF 40%)  . Diastolic dysfunction   . Spinal stenosis     shes recieved epidural steroid injections in the past  . Chronic combined systolic and diastolic CHF (congestive heart  failure) ([place])     EF 35-40% on echo 09/2013  . Chronic atrial fibrillation ([place])     a. Previously failed DCCV/amio/tikosyn  . NSVT (nonsustained ventricular tachycardia) ([place])     a. Isolated event during 09/2007 adm.  . Brady-tachy syndrome [facility])     s/p permanent pacemaker placement  . Hypertension   . GI bleeding   . Shortness of breath dyspnea   . Cellulitis and abscess of foot 08/15/2015    RT FOOT    ROS:   All systems reviewed and negative except as noted in the HPI.   Past Surgical History  Procedure Laterality Date  . Valve replacement      sever mitral regurgitation s/p mitral valve annuloplasty ring  . Cholecystectomy    . Appendectomy    . Irrigation and debridement abscess N/A 10/14/2013    Procedure: IRRIGATION AND DEBRIDEMENT PERINEAL ABSCESS;  Surgeon: Rolm Bookbinder, MD;  Location: [place];  Service: General;  Laterality: N/A;  . Biv pacemaker generator change out N/A 12/13/2014    Procedure: BIV PACEMAKER GENERATOR CHANGE OUT;  Surgeon: Evans Lance, MD;  Location: [facility] CATH LAB;  Service: Cardiovascular;  Laterality: N/A;     Family History  Problem Relation Age of Onset  . Pancreatic cancer Father   .  Other Mother     Mother died at 53 with no real medical problems     Social History   Social History  . Marital Status: Single    Spouse Name: N/A  . Number of Children: N/A  . Years of Education: N/A   Occupational History  . Not on file.   Social History Main Topics  . Smoking status: Former Smoker    Quit date: 10/11/1971  . Smokeless tobacco: Never Used  . Alcohol Use: 0.0 oz/week     Comment: rare  . Drug Use: No  . Sexual Activity: Not on file   Other Topics Concern  . Not on file   Social History Narrative     BP 128/60 mmHg  Pulse 100  Ht 5\' 2"  (1.575 m)  Wt 200 lb 6.4 oz (90.901 kg)  BMI 36.64 kg/m2  LMP 10/10/2013  Physical Exam:  Well appearing obese, elderly woman, NAD HEENT: Unremarkable Neck:  7 cm JVD, no  thyromegally Back:  No CVA tenderness Lungs:  Clear with no wheezes HEART:  Regular rate rhythm, no murmurs, no rubs, no clicks Abd:  soft, positive bowel sounds, no organomegally, no rebound, no guarding Ext:  2 plus pulses, trace peripheral edema, no cyanosis, no clubbing Skin:  No rashes no nodules Neuro:  CN II through XII intact, motor grossly intact   DEVICE  Normal device function.  See PaceArt for details.  Assess/Plan: 1. PPM - her BiV PPM is working normally. Will recheck in several months. 2. Atrial fibrillation - she is maintaining NSR and has done so for 6 months. She is not a candidate for anti-coag with a h/o GI bleeding 3. HTN - her blood pressure has been well controlled. Will follow. 4. Chronic systolic heart failure - her symptoms are well controlled. She is encouraged to maintain a low sodium diet and her twice daily lasix.  Mikle Bosworth.D.

## 2016-01-22 ENCOUNTER — Ambulatory Visit: Payer: Self-pay | Admitting: Pharmacist

## 2016-01-22 DIAGNOSIS — Z5181 Encounter for therapeutic drug level monitoring: Secondary | ICD-10-CM

## 2016-02-11 ENCOUNTER — Encounter (HOSPITAL_COMMUNITY): Payer: Self-pay | Admitting: *Deleted

## 2016-02-11 ENCOUNTER — Emergency Department (HOSPITAL_COMMUNITY)
Admission: EM | Admit: 2016-02-11 | Discharge: 2016-02-11 | Disposition: A | Discharge status: Home / Self Care | Payer: Medicare Other | Attending: Emergency Medicine | Admitting: Emergency Medicine

## 2016-02-11 DIAGNOSIS — Z79899 Other long term (current) drug therapy: Secondary | ICD-10-CM | POA: Insufficient documentation

## 2016-02-11 DIAGNOSIS — N189 Chronic kidney disease, unspecified: Secondary | ICD-10-CM | POA: Diagnosis not present

## 2016-02-11 DIAGNOSIS — Z872 Personal history of diseases of the skin and subcutaneous tissue: Secondary | ICD-10-CM | POA: Insufficient documentation

## 2016-02-11 DIAGNOSIS — I129 Hypertensive chronic kidney disease with stage 1 through stage 4 chronic kidney disease, or unspecified chronic kidney disease: Secondary | ICD-10-CM | POA: Insufficient documentation

## 2016-02-11 DIAGNOSIS — I5042 Chronic combined systolic (congestive) and diastolic (congestive) heart failure: Secondary | ICD-10-CM | POA: Insufficient documentation

## 2016-02-11 DIAGNOSIS — Z950 Presence of cardiac pacemaker: Secondary | ICD-10-CM | POA: Insufficient documentation

## 2016-02-11 DIAGNOSIS — Z87891 Personal history of nicotine dependence: Secondary | ICD-10-CM | POA: Insufficient documentation

## 2016-02-11 DIAGNOSIS — M25562 Pain in left knee: Secondary | ICD-10-CM | POA: Diagnosis present

## 2016-02-11 DIAGNOSIS — M109 Gout, unspecified: Secondary | ICD-10-CM

## 2016-02-11 DIAGNOSIS — I482 Chronic atrial fibrillation: Secondary | ICD-10-CM | POA: Diagnosis not present

## 2016-02-11 DIAGNOSIS — M1009 Idiopathic gout, multiple sites: Secondary | ICD-10-CM | POA: Diagnosis not present

## 2016-02-11 LAB — COMPREHENSIVE METABOLIC PANEL
ALBUMIN: 3.2 g/dL — AB (ref 3.5–5.0)
ALK PHOS: 84 U/L (ref 38–126)
ALT: 49 U/L (ref 14–54)
ANION GAP: 10 (ref 5–15)
AST: 41 U/L (ref 15–41)
BILIRUBIN TOTAL: 1.5 mg/dL — AB (ref 0.3–1.2)
BUN: 30 mg/dL — AB (ref 6–20)
CALCIUM: 8.4 mg/dL — AB (ref 8.9–10.3)
CO2: 24 mmol/L (ref 22–32)
Chloride: 112 mmol/L — ABNORMAL HIGH (ref 101–111)
Creatinine, Ser: 1.64 mg/dL — ABNORMAL HIGH (ref 0.44–1.00)
GFR calc Af Amer: 31 mL/min — ABNORMAL LOW (ref >60)
GFR calc non Af Amer: 27 mL/min — ABNORMAL LOW (ref >60)
GLUCOSE: 97 mg/dL (ref 65–99)
Potassium: 3.8 mmol/L (ref 3.5–5.1)
SODIUM: 146 mmol/L — AB (ref 135–145)
TOTAL PROTEIN: 6.5 g/dL (ref 6.5–8.1)

## 2016-02-11 LAB — CBC WITH DIFFERENTIAL/PLATELET
BASOS ABS: 0 10*3/uL (ref 0.0–0.1)
BASOS PCT: 0 %
EOS ABS: 0.1 10*3/uL (ref 0.0–0.7)
Eosinophils Relative: 1 %
HEMATOCRIT: 33.4 % — AB (ref 36.0–46.0)
HEMOGLOBIN: 10.4 g/dL — AB (ref 12.0–15.0)
Lymphocytes Relative: 23 %
Lymphs Abs: 2.3 10*3/uL (ref 0.7–4.0)
MCH: 28.3 pg (ref 26.0–34.0)
MCHC: 31.1 g/dL (ref 30.0–36.0)
MCV: 91 fL (ref 78.0–100.0)
MONOS PCT: 7 %
Monocytes Absolute: 0.7 10*3/uL (ref 0.1–1.0)
NEUTROS ABS: 6.9 10*3/uL (ref 1.7–7.7)
NEUTROS PCT: 69 %
Platelets: 155 10*3/uL (ref 150–400)
RBC: 3.67 MIL/uL — AB (ref 3.87–5.11)
RDW: 16.1 % — ABNORMAL HIGH (ref 11.5–15.5)
WBC: 9.9 10*3/uL (ref 4.0–10.5)

## 2016-02-11 MED ORDER — MORPHINE SULFATE (PF) 4 MG/ML IV SOLN
4.0000 mg | Freq: Once | INTRAVENOUS | Status: AC
  Administered 2016-02-11: 4 mg via INTRAVENOUS
  Filled 2016-02-11: qty 1

## 2016-02-11 MED ORDER — OXYCODONE-ACETAMINOPHEN 5-325 MG PO TABS
2.0000 | ORAL_TABLET | Freq: Once | ORAL | Status: AC
Start: 2016-02-11 — End: 2016-02-11
  Administered 2016-02-11: 2 via ORAL
  Filled 2016-02-11: qty 2

## 2016-02-11 MED ORDER — OXYCODONE-ACETAMINOPHEN 5-325 MG PO TABS: 2.0000 | ORAL_TABLET | ORAL | Status: DC | PRN

## 2016-02-11 MED ORDER — METHYLPREDNISOLONE SODIUM SUCC 125 MG IJ SOLR
60.0000 mg | Freq: Once | INTRAMUSCULAR | Status: AC
  Administered 2016-02-11: 60 mg via INTRAVENOUS
  Filled 2016-02-11: qty 2

## 2016-02-11 MED ORDER — PREDNISONE 10 MG (21) PO TBPK: 10.0000 mg | ORAL_TABLET | Freq: Every day | ORAL | Status: DC

## 2016-02-11 MED ORDER — COLCHICINE 0.6 MG PO TABS: 0.6000 mg | ORAL_TABLET | Freq: Two times a day (BID) | ORAL | Status: DC

## 2016-02-11 MED ORDER — COLCHICINE 0.6 MG PO TABS
0.6000 mg | ORAL_TABLET | Freq: Once | ORAL | Status: AC
  Administered 2016-02-11: 0.6 mg via ORAL
  Filled 2016-02-11: qty 1

## 2016-02-11 NOTE — Discharge Instructions (Signed)
Gout Keep your follow-up appointment with rheumatology on April 7. Take medications as prescribed. Return for swelling or redness of the joint. Gout is an inflammatory arthritis caused by a buildup of uric acid crystals in the joints. Uric acid is a chemical that is normally present in the blood. When the level of uric acid in the blood is too high it can form crystals that deposit in your joints and tissues. This causes joint redness, soreness, and swelling (inflammation). Repeat attacks are common. Over time, uric acid crystals can form into masses (tophi) near a joint, destroying bone and causing disfigurement. Gout is treatable and often preventable. CAUSES  The disease begins with elevated levels of uric acid in the blood. Uric acid is produced by your body when it breaks down a naturally found substance called purines. Certain foods you eat, such as meats and fish, contain high amounts of purines. Causes of an elevated uric acid level include:  Being passed down from parent to child (heredity).  Diseases that cause increased uric acid production (such as obesity, psoriasis, and certain cancers).  Excessive alcohol use.  Diet, especially diets rich in meat and seafood.  Medicines, including certain cancer-fighting medicines (chemotherapy), water pills (diuretics), and aspirin.  Chronic kidney disease. The kidneys are no longer able to remove uric acid well.  Problems with metabolism. Conditions strongly associated with gout include:  Obesity.  High blood pressure.  High cholesterol.  Diabetes. Not everyone with elevated uric acid levels gets gout. It is not understood why some people get gout and others do not. Surgery, joint injury, and eating too much of certain foods are some of the factors that can lead to gout attacks. SYMPTOMS   An attack of gout comes on quickly. It causes intense pain with redness, swelling, and warmth in a joint.  Fever can occur.  Often, only one  joint is involved. Certain joints are more commonly involved:  Base of the big toe.  Knee.  Ankle.  Wrist.  Finger. Without treatment, an attack usually goes away in a few days to weeks. Between attacks, you usually will not have symptoms, which is different from many other forms of arthritis. DIAGNOSIS  Your caregiver will suspect gout based on your symptoms and exam. In some cases, tests may be recommended. The tests may include:  Blood tests.  Urine tests.  X-rays.  Joint fluid exam. This exam requires a needle to remove fluid from the joint (arthrocentesis). Using a microscope, gout is confirmed when uric acid crystals are seen in the joint fluid. TREATMENT  There are two phases to gout treatment: treating the sudden onset (acute) attack and preventing attacks (prophylaxis).  Treatment of an Acute Attack.  Medicines are used. These include anti-inflammatory medicines or steroid medicines.  An injection of steroid medicine into the affected joint is sometimes necessary.  The painful joint is rested. Movement can worsen the arthritis.  You may use warm or cold treatments on painful joints, depending which works best for you.  Treatment to Prevent Attacks.  If you suffer from frequent gout attacks, your caregiver may advise preventive medicine. These medicines are started after the acute attack subsides. These medicines either help your kidneys eliminate uric acid from your body or decrease your uric acid production. You may need to stay on these medicines for a very long time.  The early phase of treatment with preventive medicine can be associated with an increase in acute gout attacks. For this reason, during the first few  months of treatment, your caregiver may also advise you to take medicines usually used for acute gout treatment. Be sure you understand your caregiver's directions. Your caregiver may make several adjustments to your medicine dose before these medicines  are effective.  Discuss dietary treatment with your caregiver or dietitian. Alcohol and drinks high in sugar and fructose and foods such as meat, poultry, and seafood can increase uric acid levels. Your caregiver or dietitian can advise you on drinks and foods that should be limited. HOME CARE INSTRUCTIONS   Do not take aspirin to relieve pain. This raises uric acid levels.  Only take over-the-counter or prescription medicines for pain, discomfort, or fever as directed by your caregiver.  Rest the joint as much as possible. When in bed, keep sheets and blankets off painful areas.  Keep the affected joint raised (elevated).  Apply warm or cold treatments to painful joints. Use of warm or cold treatments depends on which works best for you.  Use crutches if the painful joint is in your leg.  Drink enough fluids to keep your urine clear or pale yellow. This helps your body get rid of uric acid. Limit alcohol, sugary drinks, and fructose drinks.  Follow your dietary instructions. Pay careful attention to the amount of protein you eat. Your daily diet should emphasize fruits, vegetables, whole grains, and fat-free or low-fat milk products. Discuss the use of coffee, vitamin C, and cherries with your caregiver or dietitian. These may be helpful in lowering uric acid levels.  Maintain a healthy body weight. SEEK MEDICAL CARE IF:   You develop diarrhea, vomiting, or any side effects from medicines.  You do not feel better in 24 hours, or you are getting worse. SEEK IMMEDIATE MEDICAL CARE IF:   Your joint becomes suddenly more tender, and you have chills or a fever. MAKE SURE YOU:   Understand these instructions.  Will watch your condition.  Will get help right away if you are not doing well or get worse.   This information is not intended to replace advice given to you by your health care provider. Make sure you discuss any questions you have with your health care provider.   Document  Released: 10/31/2000 Document Revised: 11/24/2014 Document Reviewed: 06/16/2012 Elsevier Interactive Patient Education 2016 [place] are compounds that affect the level of uric acid in your body. A low-purine diet is a diet that is low in purines. Eating a low-purine diet can prevent the level of uric acid in your body from getting too high and causing gout or kidney stones or both. WHAT DO I NEED TO KNOW ABOUT THIS DIET?  Choose low-purine foods. Examples of low-purine foods are listed in the next section.  Drink plenty of fluids, especially water. Fluids can help remove uric acid from your body. Try to drink 8-16 cups (1.9-3.8 L) a day.  Limit foods high in fat, especially saturated fat, as fat makes it harder for the body to get rid of uric acid. Foods high in saturated fat include pizza, cheese, ice cream, whole milk, fried foods, and gravies. Choose foods that are lower in fat and lean sources of protein. Use olive oil when cooking as it contains healthy fats that are not high in saturated fat.  Limit alcohol. Alcohol interferes with the elimination of uric acid from your body. If you are having a gout attack, avoid all alcohol.  Keep in mind that different people's bodies react differently to different foods. You  will probably learn over time which foods do or do not affect you. If you discover that a food tends to cause your gout to flare up, avoid eating that food. You can more freely enjoy foods that do not cause problems. If you have any questions about a food item, talk to your dietitian or health care provider. WHICH FOODS ARE LOW, MODERATE, AND HIGH IN PURINES? The following is a list of foods that are low, moderate, and high in purines. You can eat any amount of the foods that are low in purines. You may be able to have small amounts of foods that are moderate in purines. Ask your health care provider how much of a food moderate in purines you can have.  Avoid foods high in purines. Grains  Foods low in purines: Enriched white bread, pasta, rice, cake, cornbread, popcorn.  Foods moderate in purines: Whole-grain breads and cereals, wheat germ, bran, oatmeal. Uncooked oatmeal. Dry wheat bran or wheat germ.  Foods high in purines: Pancakes, [place] toast, biscuits, muffins. Vegetables  Foods low in purines: All vegetables, except those that are moderate in purines.  Foods moderate in purines: Asparagus, cauliflower, spinach, mushrooms, green peas. Fruits  All fruits are low in purines. Meats and other Protein Foods  Foods low in purines: Eggs, nuts, peanut butter.  Foods moderate in purines: 80-90% lean beef, lamb, veal, pork, poultry, fish, eggs, peanut butter, nuts. Crab, lobster, oysters, and shrimp. Cooked dried beans, peas, and lentils.  Foods high in purines: Anchovies, sardines, herring, mussels, tuna, codfish, scallops, trout, and haddock. Bernie[place] Salines. Organ meats (such as liver or kidney). Tripe. Game meat. Goose. Sweetbreads. Dairy  All dairy foods are low in purines. Low-fat and fat-free dairy products are best because they are low in saturated fat. Beverages  Drinks low in purines: Water, carbonated beverages, tea, coffee, cocoa.  Drinks moderate in purines: Soft drinks and other drinks sweetened with high-fructose corn syrup. Juices. To find whether a food or drink is sweetened with high-fructose corn syrup, look at the ingredients list.  Drinks high in purines: Alcoholic beverages (such as beer). Condiments  Foods low in purines: Salt, herbs, olives, pickles, relishes, vinegar.  Foods moderate in purines: Butter, margarine, oils, mayonnaise. Fats and Oils  Foods low in purines: All types, except gravies and sauces made with meat.  Foods high in purines: Gravies and sauces made with meat. Other Foods  Foods low in purines: Sugars, sweets, gelatin. Cake. Soups made without meat.  Foods moderate in purines: Meat-based  or fish-based soups, broths, or bouillons. Foods and drinks sweetened with high-fructose corn syrup.  Foods high in purines: High-fat desserts (such as ice cream, cookies, cakes, pies, doughnuts, and chocolate). Contact your dietitian for more information on foods that are not listed here.   This information is not intended to replace advice given to you by your health care provider. Make sure you discuss any questions you have with your health care provider.   Document Released: 02/28/2011 Document Revised: 11/08/2013 Document Reviewed: 10/10/2013 Elsevier Interactive Patient Education [employer].

## 2016-02-11 NOTE — ED Notes (Signed)
Pt reports having gout and arthritis. Has been on steriods x 2 but still having severe pain to bilateral feet, knees, wrists and now has difficulty walking due to pain.

## 2016-02-11 NOTE — ED Provider Notes (Signed)
CSN: [id number]     Arrival date & time 02/11/16  [id number] History   First MD Initiated Contact with Patient 02/11/16 1003     Chief Complaint  Patient presents with  . Pain   (Consider location/radiation/quality/duration/timing/severity/associated sxs/prior Treatment) The history is provided by the patient. No language interpreter was used.  [name] is an 82 year old female with a history of chronic kidney disease, A. fib, hypertension, GI bleed, gout, and rheumatoid arthritis who presents with joint pain in wrists, left knee, and bilateral feet. She states that she has been dealing with this pain for over a month. Her primary care provider put her on colchicine and a low dose of prednisone. After finishing the first round of prednisone she states her pain return. She returned to her PCP who put her on prednisone again. She finished the dose 2 days ago which is when her pain started up again. He denies any fever, chills, night sweats.  Past Medical History  Diagnosis Date  . Mitral valve disorder     a. Severe MR s/p repair 2003 (28 mm annuloplasty ring and and oversew of LAA). No CAD by cath at that time.  . Pacemaker     a. 2003: post-op afib after MR repair then symptomatic bradycardia requiring pacemaker. b. Upgrade to Medtronic Bi-V Pacemaker 2007.  [name] Cirrhosis ([place])     a. Newly recognized 09/2013 - by CT.  . CKD (chronic kidney disease)   . Pulmonary hypertension, mild (HCC)     secondary to mild/mod LV dysfunction(EF 40%)  . Diastolic dysfunction   . Spinal stenosis     shes recieved epidural steroid injections in the past  . Chronic combined systolic and diastolic CHF (congestive heart failure) ([place])     EF 35-40% on echo 09/2013  . Chronic atrial fibrillation ([place])     a. Previously failed DCCV/amio/tikosyn  . NSVT (nonsustained ventricular tachycardia) ([place])     a. Isolated event during 09/2007 adm.  . Brady-tachy syndrome [facility])     s/p permanent pacemaker placement  .  Hypertension   . GI bleeding   . Shortness of breath dyspnea   . Cellulitis and abscess of foot 08/15/2015    RT FOOT   Past Surgical History  Procedure Laterality Date  . Valve replacement      sever mitral regurgitation s/p mitral valve annuloplasty ring  . Cholecystectomy    . Appendectomy    . Irrigation and debridement abscess N/A 10/14/2013    Procedure: IRRIGATION AND DEBRIDEMENT PERINEAL ABSCESS;  Surgeon: Rolm Bookbinder, MD;  Location: [facility];  Service: General;  Laterality: N/A;  . Biv pacemaker generator change out N/A 12/13/2014    Procedure: BIV PACEMAKER GENERATOR CHANGE OUT;  Surgeon: Evans Lance, MD;  Location: [facility] CATH LAB;  Service: Cardiovascular;  Laterality: N/A;   Family History  Problem Relation Age of Onset  . Pancreatic cancer Father   . Other Mother     Mother died at 63 with no real medical problems   Social History  Substance Use Topics  . Smoking status: Former Smoker    Quit date: 10/11/1971  . Smokeless tobacco: Never Used  . Alcohol Use: 0.0 oz/week     Comment: rare   OB History    No data available     Review of Systems  Constitutional: Negative for fever and chills.  Musculoskeletal: Positive for arthralgias and gait problem.  Skin: Negative for color change.  All other systems reviewed and are  negative.     Allergies  Review of patient's allergies indicates no known allergies.  Home Medications   Prior to Admission medications   Medication Sig Start Date End Date Taking? Authorizing Provider  allopurinol (ZYLOPRIM) 100 MG tablet Take 100 mg by mouth daily. 01/29/16  Yes Historical Provider, MD  beta carotene w/minerals (OCUVITE) tablet Take 1 tablet by mouth every evening.    Yes Historical Provider, MD  cetirizine (ZYRTEC) 10 MG tablet Take 10 mg by mouth daily as needed for allergies.    Yes Historical Provider, MD  digoxin (LANOXIN) 0.125 MG tablet TAKE 0.5 TABLETS (0.0625 MG TOTAL) BY MOUTH DAILY. 06/13/15  Yes Sueanne Margarita, MD  furosemide (LASIX) 40 MG tablet Take 1 tablet (40 mg total) by mouth 2 (two) times daily. 12/03/15  Yes Sueanne Margarita, MD  midodrine (PROAMATINE) 2.5 MG tablet TAKE 1 TABLET BY MOUTH THREE TIMES A DAY WITH MEALS   Yes Historical Provider, MD  Multiple Vitamin (MULTIVITAMIN WITH MINERALS) TABS tablet Take 1 tablet by mouth daily.   Yes Historical Provider, MD  pantoprazole (PROTONIX) 40 MG tablet Take 40 mg by mouth daily.   Yes Historical Provider, MD  potassium chloride SA (K-DUR,KLOR-CON) 20 MEQ tablet Take 1 tablet by mouth  daily 10/09/15  Yes Sueanne Margarita, MD  colchicine 0.6 MG tablet Take 1 tablet (0.6 mg total) by mouth 2 (two) times daily. 02/11/16   Amilio Zehnder Patel-Mills, PA-C  oxyCODONE-acetaminophen (PERCOCET/ROXICET) 5-325 MG tablet Take 2 tablets by mouth every 4 (four) hours as needed for severe pain. 02/11/16   Arthor Gorter Patel-Mills, PA-C  predniSONE (STERAPRED UNI-PAK 21 TAB) 10 MG (21) TBPK tablet Take 1 tablet (10 mg total) by mouth daily. Take 6 tabs by mouth daily  for 2 days, then 5 tabs for 2 days, then 4 tabs for 2 days, then 3 tabs for 2 days, 2 tabs for 2 days, then 1 tab by mouth daily for 2 days 02/11/16   Orvil Feil Patel-Mills, PA-C   BP 128/56 mmHg  Pulse 70  Temp(Src) 97.7 F (36.5 C) (Oral)  Resp 16  SpO2 98%  LMP 10/10/2013 Physical Exam  Constitutional: She is oriented to person, place, and time. She appears well-developed and well-nourished.  HENT:  Head: Normocephalic and atraumatic.  Eyes: Conjunctivae are normal.  Neck: Normal range of motion. Neck supple.  Cardiovascular: Normal rate.   Pulmonary/Chest: Effort normal. No respiratory distress.  Musculoskeletal: Normal range of motion.  Left first MTP joint, left knee, and bilateral wrist tenderness. No joint effusion or swelling. No erythema. No warmth. No fluctuance or signs of cellulitis.  Neurological: She is alert and oriented to person, place, and time.  Skin: Skin is warm and dry.  Psychiatric:  She has a normal mood and affect.  Nursing note and vitals reviewed.   ED Course  Procedures (including critical care time) Labs Review Labs Reviewed  CBC WITH DIFFERENTIAL/PLATELET - Abnormal; Notable for the following:    RBC 3.67 (*)    Hemoglobin 10.4 (*)    HCT 33.4 (*)    RDW 16.1 (*)    All other components within normal limits  COMPREHENSIVE METABOLIC PANEL - Abnormal; Notable for the following:    Sodium 146 (*)    Chloride 112 (*)    BUN 30 (*)    Creatinine, Ser 1.64 (*)    Calcium 8.4 (*)    Albumin 3.2 (*)    Total Bilirubin 1.5 (*)    GFR calc  non Af Amer 27 (*)    GFR calc Af Amer 31 (*)    All other components within normal limits    Imaging Review No results found. I have personally reviewed and evaluated these lab results as part of my medical decision-making.   EKG Interpretation None      MDM   Final diagnoses:  Acute gout of multiple sites, unspecified cause   Patient with history of gout presents for multiple sites of joint pain. I do not believe this is septic arthritis. There is no joint effusion, warmth, or erythema. Vital signs stable and patient is afebrile. I believe this is most likely a gout flareup. Patient had last dose of prednisone 2 days ago. She has not had any pain medication since. She has chronic kidney disease. Labs are reassuring. I discussed return precautions with the patient. She was put on prednisone and colchicine. Patient states she has an appointment with the rheumatologist on April 7. Patient agrees with plan.   Ottie Glazier, PA-C 02/12/16 [id number]  Leo Grosser, MD 02/12/16 [id number]

## 2016-03-27 ENCOUNTER — Telehealth: Payer: Self-pay | Admitting: Cardiology

## 2016-03-27 NOTE — Telephone Encounter (Signed)
New Message  Pt called states that she was advised to call in if she has gained a significant amount of weight. Pt states that she has gained 10 lbs and she is concerned. Please call back to discuss

## 2016-03-27 NOTE — Telephone Encounter (Signed)
agree

## 2016-03-27 NOTE — Telephone Encounter (Signed)
[name] called to report a weight gain of 10-11 pounds since December. Her current weight is now 211 lbs. She has no complaints - no CP, SOB, swelling. She says she feels great. She st she has been seeing a rheumatologist for gout flare-ups and has been on and off of prednisone for 3 or so months.  Informed the patient that although she may be following a healthy diet, steroids can cause weight gain. Instructed her to call if she starts to experience SOB or swelling. Confirmed OV with Dr. Radford Pax 6/13. She understands she will be called if Dr. Radford Pax has further recommendations.

## 2016-04-22 ENCOUNTER — Other Ambulatory Visit: Payer: Self-pay | Admitting: Cardiology

## 2016-04-29 ENCOUNTER — Ambulatory Visit: Payer: Medicare Other | Admitting: Cardiology

## 2016-05-14 ENCOUNTER — Ambulatory Visit: Payer: Medicare Other | Admitting: Cardiology

## 2016-05-15 ENCOUNTER — Inpatient Hospital Stay (HOSPITAL_COMMUNITY)
Admission: AD | Admit: 2016-05-15 | Discharge: 2016-05-22 | DRG: 291 | Disposition: A | Discharge status: Home Health | Payer: Medicare Other | Source: Ambulatory Visit | Attending: Interventional Cardiology | Admitting: Interventional Cardiology

## 2016-05-15 ENCOUNTER — Encounter (HOSPITAL_COMMUNITY): Payer: Self-pay | Admitting: Nurse Practitioner

## 2016-05-15 ENCOUNTER — Ambulatory Visit (INDEPENDENT_AMBULATORY_CARE_PROVIDER_SITE_OTHER): Payer: Medicare Other | Admitting: Interventional Cardiology

## 2016-05-15 ENCOUNTER — Encounter: Payer: Self-pay | Admitting: Cardiology

## 2016-05-15 VITALS — BP 144/58 | HR 88 | Ht 62.0 in | Wt 224.8 lb

## 2016-05-15 DIAGNOSIS — I495 Sick sinus syndrome: Secondary | ICD-10-CM | POA: Diagnosis present

## 2016-05-15 DIAGNOSIS — D649 Anemia, unspecified: Secondary | ICD-10-CM | POA: Diagnosis present

## 2016-05-15 DIAGNOSIS — I5023 Acute on chronic systolic (congestive) heart failure: Secondary | ICD-10-CM | POA: Diagnosis not present

## 2016-05-15 DIAGNOSIS — Z950 Presence of cardiac pacemaker: Secondary | ICD-10-CM | POA: Diagnosis present

## 2016-05-15 DIAGNOSIS — I059 Rheumatic mitral valve disease, unspecified: Secondary | ICD-10-CM | POA: Diagnosis not present

## 2016-05-15 DIAGNOSIS — I482 Chronic atrial fibrillation: Secondary | ICD-10-CM | POA: Diagnosis present

## 2016-05-15 DIAGNOSIS — I130 Hypertensive heart and chronic kidney disease with heart failure and stage 1 through stage 4 chronic kidney disease, or unspecified chronic kidney disease: Secondary | ICD-10-CM | POA: Diagnosis present

## 2016-05-15 DIAGNOSIS — Z9889 Other specified postprocedural states: Secondary | ICD-10-CM

## 2016-05-15 DIAGNOSIS — I081 Rheumatic disorders of both mitral and tricuspid valves: Secondary | ICD-10-CM | POA: Diagnosis present

## 2016-05-15 DIAGNOSIS — I5043 Acute on chronic combined systolic (congestive) and diastolic (congestive) heart failure: Secondary | ICD-10-CM | POA: Diagnosis present

## 2016-05-15 DIAGNOSIS — M109 Gout, unspecified: Secondary | ICD-10-CM | POA: Diagnosis present

## 2016-05-15 DIAGNOSIS — N183 Chronic kidney disease, stage 3 (moderate): Secondary | ICD-10-CM | POA: Diagnosis present

## 2016-05-15 DIAGNOSIS — I4821 Permanent atrial fibrillation: Secondary | ICD-10-CM | POA: Diagnosis present

## 2016-05-15 DIAGNOSIS — I951 Orthostatic hypotension: Secondary | ICD-10-CM | POA: Diagnosis present

## 2016-05-15 DIAGNOSIS — Z952 Presence of prosthetic heart valve: Secondary | ICD-10-CM | POA: Diagnosis not present

## 2016-05-15 DIAGNOSIS — I272 Other secondary pulmonary hypertension: Secondary | ICD-10-CM | POA: Diagnosis present

## 2016-05-15 DIAGNOSIS — I481 Persistent atrial fibrillation: Secondary | ICD-10-CM | POA: Diagnosis not present

## 2016-05-15 DIAGNOSIS — N179 Acute kidney failure, unspecified: Secondary | ICD-10-CM | POA: Diagnosis present

## 2016-05-15 DIAGNOSIS — E876 Hypokalemia: Secondary | ICD-10-CM | POA: Diagnosis not present

## 2016-05-15 DIAGNOSIS — E8770 Fluid overload, unspecified: Secondary | ICD-10-CM | POA: Diagnosis present

## 2016-05-15 DIAGNOSIS — I509 Heart failure, unspecified: Secondary | ICD-10-CM | POA: Diagnosis not present

## 2016-05-15 DIAGNOSIS — R0600 Dyspnea, unspecified: Secondary | ICD-10-CM

## 2016-05-15 LAB — CBC WITH DIFFERENTIAL/PLATELET
BASOS ABS: 0 10*3/uL (ref 0.0–0.1)
BASOS PCT: 0 %
Eosinophils Absolute: 0.2 10*3/uL (ref 0.0–0.7)
Eosinophils Relative: 4 %
HEMATOCRIT: 29.5 % — AB (ref 36.0–46.0)
HEMOGLOBIN: 8.6 g/dL — AB (ref 12.0–15.0)
LYMPHS PCT: 28 %
Lymphs Abs: 1.5 10*3/uL (ref 0.7–4.0)
MCH: 28.1 pg (ref 26.0–34.0)
MCHC: 29.2 g/dL — ABNORMAL LOW (ref 30.0–36.0)
MCV: 96.4 fL (ref 78.0–100.0)
Monocytes Absolute: 0.6 10*3/uL (ref 0.1–1.0)
Monocytes Relative: 11 %
NEUTROS ABS: 3 10*3/uL (ref 1.7–7.7)
NEUTROS PCT: 57 %
Platelets: 142 10*3/uL — ABNORMAL LOW (ref 150–400)
RBC: 3.06 MIL/uL — AB (ref 3.87–5.11)
RDW: 17.7 % — ABNORMAL HIGH (ref 11.5–15.5)
WBC: 5.2 10*3/uL (ref 4.0–10.5)

## 2016-05-15 LAB — BASIC METABOLIC PANEL
ANION GAP: 9 (ref 5–15)
BUN: 14 mg/dL (ref 6–20)
CALCIUM: 8.8 mg/dL — AB (ref 8.9–10.3)
CO2: 25 mmol/L (ref 22–32)
Chloride: 110 mmol/L (ref 101–111)
Creatinine, Ser: 1.38 mg/dL — ABNORMAL HIGH (ref 0.44–1.00)
GFR, EST AFRICAN AMERICAN: 39 mL/min — AB (ref >60)
GFR, EST NON AFRICAN AMERICAN: 33 mL/min — AB (ref >60)
GLUCOSE: 86 mg/dL (ref 65–99)
POTASSIUM: 3.7 mmol/L (ref 3.5–5.1)
Sodium: 144 mmol/L (ref 135–145)

## 2016-05-15 LAB — BRAIN NATRIURETIC PEPTIDE: B NATRIURETIC PEPTIDE 5: 205.5 pg/mL — AB (ref 0.0–100.0)

## 2016-05-15 LAB — TSH: TSH: 6.805 u[IU]/mL — AB (ref 0.350–4.500)

## 2016-05-15 MED ORDER — SODIUM CHLORIDE 0.9% FLUSH
3.0000 mL | Freq: Two times a day (BID) | INTRAVENOUS | Status: DC
  Administered 2016-05-15 – 2016-05-22 (×13): 3 mL via INTRAVENOUS

## 2016-05-15 MED ORDER — ACETAMINOPHEN 325 MG PO TABS
650.0000 mg | ORAL_TABLET | ORAL | Status: DC | PRN
  Administered 2016-05-21: 650 mg via ORAL
  Filled 2016-05-15: qty 2

## 2016-05-15 MED ORDER — ALLOPURINOL 300 MG PO TABS
300.0000 mg | ORAL_TABLET | Freq: Every day | ORAL | Status: DC
  Administered 2016-05-16 – 2016-05-22 (×7): 300 mg via ORAL
  Filled 2016-05-15 (×7): qty 1

## 2016-05-15 MED ORDER — FUROSEMIDE 10 MG/ML IJ SOLN
40.0000 mg | Freq: Two times a day (BID) | INTRAMUSCULAR | Status: DC
  Administered 2016-05-15: 40 mg via INTRAVENOUS
  Filled 2016-05-15 (×2): qty 4

## 2016-05-15 MED ORDER — PANTOPRAZOLE SODIUM 40 MG PO TBEC
40.0000 mg | DELAYED_RELEASE_TABLET | Freq: Every day | ORAL | Status: DC
  Administered 2016-05-16 – 2016-05-22 (×7): 40 mg via ORAL
  Filled 2016-05-15 (×7): qty 1

## 2016-05-15 MED ORDER — DIGOXIN 125 MCG PO TABS
0.0625 mg | ORAL_TABLET | Freq: Every day | ORAL | Status: DC
  Administered 2016-05-16 – 2016-05-22 (×7): 0.0625 mg via ORAL
  Filled 2016-05-15 (×7): qty 1

## 2016-05-15 MED ORDER — SODIUM CHLORIDE 0.9% FLUSH: 3.0000 mL | INTRAVENOUS | Status: DC | PRN

## 2016-05-15 MED ORDER — MIDODRINE HCL 5 MG PO TABS
2.5000 mg | ORAL_TABLET | Freq: Two times a day (BID) | ORAL | Status: DC
  Administered 2016-05-15 – 2016-05-20 (×11): 2.5 mg via ORAL
  Filled 2016-05-15 (×11): qty 1

## 2016-05-15 MED ORDER — COLCHICINE 0.6 MG PO TABS
0.6000 mg | ORAL_TABLET | Freq: Two times a day (BID) | ORAL | Status: DC
  Administered 2016-05-16: 0.6 mg via ORAL
  Filled 2016-05-15 (×2): qty 1

## 2016-05-15 MED ORDER — SODIUM CHLORIDE 0.9 % IV SOLN: 250.0000 mL | INTRAVENOUS | Status: DC | PRN

## 2016-05-15 MED ORDER — POTASSIUM CHLORIDE CRYS ER 20 MEQ PO TBCR
20.0000 meq | EXTENDED_RELEASE_TABLET | Freq: Once | ORAL | Status: AC
  Administered 2016-05-15: 20 meq via ORAL
  Filled 2016-05-15: qty 1

## 2016-05-15 MED ORDER — ONDANSETRON HCL 4 MG/2ML IJ SOLN
4.0000 mg | Freq: Four times a day (QID) | INTRAMUSCULAR | Status: DC | PRN
  Administered 2016-05-19 – 2016-05-20 (×2): 4 mg via INTRAVENOUS
  Filled 2016-05-15 (×2): qty 2

## 2016-05-15 NOTE — Progress Notes (Signed)
Pt was direct admit for CHF exac, pt a/o, no c/o pain, pt receiving 40 mg IV lasix BID, VSS, pt stable

## 2016-05-15 NOTE — Patient Instructions (Signed)
YOU ARE BEING ADMITTED TO [facility] TO 3 EAST UNIT.

## 2016-05-15 NOTE — H&P (Signed)
05/15/2016 [name]   June 10, 1926  [id number]  Primary Physician Kandice Hams, MD Primary Cardiologist: Dr. Radford Pax Electrophysiologist: Dr. 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 She is not on a/c given history severe GIB/ anemia with hgb of 3. This was stopped in 2016.  Her dry weight is 200 lbs.  She presents to clinic today with complaint of 25 lb weight gain, LEE, dyspnea and 2 pillow orthopnea. Office weight is 224 lbs. She has evidence of volume overload with 2+ bilateral LEE. She has been fully compliant with her lasix and adherent to a low sodium diet. She notes decreased UOP despite taking lasix BID. She notes this all started about 1 month ago when she had to take prednisone for an acute gout flare.   Current Outpatient Prescriptions  Medication Sig Dispense Refill  . allopurinol (ZYLOPRIM) 300 MG tablet Take 300 mg by mouth daily.    . beta carotene w/minerals (OCUVITE) tablet Take 1 tablet by mouth every evening.     . cetirizine (ZYRTEC) 10 MG tablet Take 10 mg by mouth daily as needed for allergies.     [name] colchicine 0.6 MG tablet Take 1 tablet (0.6 mg total) by mouth 2 (two) times daily. 60 tablet 0  . DIGOX 125 MCG tablet Take one-half tablet by  mouth daily 45 tablet 11  . furosemide (LASIX) 40 MG tablet Take 1 tablet (40 mg total) by mouth 2 (two) times daily. 90 tablet 2  . midodrine (PROAMATINE) 2.5 MG tablet TAKE 1 TABLET BY MOUTH THREE TIMES A DAY WITH MEALS    . Multiple Vitamin (MULTIVITAMIN WITH MINERALS) TABS tablet Take 1 tablet by mouth daily.    . pantoprazole (PROTONIX) 40 MG tablet Take 40 mg by mouth daily.    . potassium chloride SA (K-DUR,KLOR-CON) 20 MEQ tablet  Take 1 tablet by mouth  daily 90 tablet 3   No current facility-administered medications for this visit.    No Known Allergies  Social History   Social History  . Marital Status: Single    Spouse Name: N/A  . Number of Children: N/A  . Years of Education: N/A   Occupational History  . Not on file.   Social History Main Topics  . Smoking status: Former Smoker    Quit date: 10/11/1971  . Smokeless tobacco: Never Used  . Alcohol Use: 0.0 oz/week     Comment: rare  . Drug Use: No  . Sexual Activity: Not on file   Other Topics Concern  . Not on file   Social History Narrative     Review of Systems: General: negative for chills, fever, night sweats or weight changes.  Cardiovascular: negative for chest pain, dyspnea on exertion, edema, orthopnea, palpitations, paroxysmal nocturnal dyspnea or shortness of breath Dermatological: negative for rash Respiratory: negative for cough or wheezing Urologic: negative for hematuria Abdominal: negative for nausea, vomiting, diarrhea, bright red blood per rectum, melena, or hematemesis Neurologic: negative for visual changes, syncope, or dizziness All other systems reviewed and are otherwise negative except as noted above.    Blood pressure 144/58, pulse 88, height 5\' 2"  (1.575 m), weight 224 lb 12.8 oz (101.969 kg), last menstrual period 10/10/2013, SpO2 99 %.  General appearance: alert, cooperative, no distress and moderately obese Neck: no JVD and + JVD at the level of the ear Lungs: decreased BS at the bases Heart: regular rate and rhythm and 2/6 SM at the RSB Extremities: 2+ bilateral LEE Pulses: 2+ and symmetric Skin: warm and dry Neurologic: Grossly normal  EKG Not performed  ASSESSMENT AND PLAN:   1. Acute on Chronic Combined Systolic + Diastolic CHF: Patient's weight is up 25 lb from baseline. She is markedly volume overloaded with 2+ bilateral LEE and elevated JVD at the level of the ear. She has exertional dyspnea  and 2 pillow orthopnea. She is not diuresing well with PO diuretics. We discussed increasing her home lasix, however both she and her daughter have reservations regarding continuing home management given her age, h/o orthostatic hypotension, CKD and fall risk. I have discussed with Dr. Irish Lack, [place]. We will plan to admit for IV diuretics. Inpatient management will allow [place] to closely monitor renal function, BP and electrolytes. We will check a repeat 2D echo to reassess LVEF and her mitral valve. Low sodium diet. Daily weights.   2. H/o MV Regurgitation: s/p MV repair. Will check a 2D echo to assess status of mitral valve.   3. Chronic Atrial Fibrillation: rate is controlled. Not on a/c given history of severe GIB/ anemia.  4. Orthostatic Hypotension: she takes midodrine at home. Will monitor BP closely.  5. Gout: at increased risk for recurrence given management with IV diuretics. Monitor. Continue Allopurinol.    Sherry Jester PA-C 05/15/2016 2:41 PM  I have  reviewed assessment and plan and discussed with [name].  Agree with above as stated.    Larae Grooms

## 2016-05-16 ENCOUNTER — Other Ambulatory Visit (HOSPITAL_COMMUNITY): Payer: Medicare Other

## 2016-05-16 ENCOUNTER — Inpatient Hospital Stay (HOSPITAL_COMMUNITY): Payer: Medicare Other

## 2016-05-16 DIAGNOSIS — I5023 Acute on chronic systolic (congestive) heart failure: Secondary | ICD-10-CM

## 2016-05-16 DIAGNOSIS — I509 Heart failure, unspecified: Secondary | ICD-10-CM

## 2016-05-16 LAB — ECHOCARDIOGRAM COMPLETE
CHL CUP MV DEC (S): 306
CHL CUP MV M VEL: 94.1
E/e' ratio: 30.27
EWDT: 306 ms
FS: 24 % — AB (ref 28–44)
Height: 62 in
IVS/LV PW RATIO, ED: 1.03
LA diam index: 2.2 cm/m2
LASIZE: 44 mm
LAVOL: 77.4 mL
LAVOLA4C: 65.5 mL
LAVOLIN: 38.7 mL/m2
LEFT ATRIUM END SYS DIAM: 44 mm
LV E/e'average: 30.27
LV PW d: 9.42 mm — AB (ref 0.6–1.1)
LV TDI E'MEDIAL: 7.62
LVEEMED: 30.27
LVELAT: 6.31 cm/s
LVOT area: 2.84 cm2
LVOTD: 19 mm
MV Annulus VTI: 45.2 cm
MV Peak grad: 15 mmHg
MV pk E vel: 191 m/s
MVAP: 2.1 cm2
MVG: 5 mmHg
MVSPHT: 105 ms
TAPSE: 30.4 mm
TDI e' lateral: 6.31
VTI: 164 cm
Weight: 3576 oz

## 2016-05-16 LAB — BASIC METABOLIC PANEL
Anion gap: 10 (ref 5–15)
BUN: 16 mg/dL (ref 6–20)
CALCIUM: 8.4 mg/dL — AB (ref 8.9–10.3)
CHLORIDE: 109 mmol/L (ref 101–111)
CO2: 25 mmol/L (ref 22–32)
CREATININE: 1.55 mg/dL — AB (ref 0.44–1.00)
GFR calc Af Amer: 34 mL/min — ABNORMAL LOW (ref >60)
GFR calc non Af Amer: 29 mL/min — ABNORMAL LOW (ref >60)
GLUCOSE: 96 mg/dL (ref 65–99)
Potassium: 3.8 mmol/L (ref 3.5–5.1)
Sodium: 144 mmol/L (ref 135–145)

## 2016-05-16 MED ORDER — PERFLUTREN LIPID MICROSPHERE
1.0000 mL | INTRAVENOUS | Status: AC | PRN
Start: 2016-05-16 — End: 2016-05-16
  Administered 2016-05-16: 2 mL via INTRAVENOUS
  Filled 2016-05-16: qty 10

## 2016-05-16 MED ORDER — FUROSEMIDE 10 MG/ML IJ SOLN
80.0000 mg | Freq: Two times a day (BID) | INTRAMUSCULAR | Status: DC
  Administered 2016-05-16 – 2016-05-20 (×9): 80 mg via INTRAVENOUS
  Filled 2016-05-16 (×9): qty 8

## 2016-05-16 MED ORDER — PERFLUTREN LIPID MICROSPHERE
INTRAVENOUS | Status: AC
  Filled 2016-05-16: qty 10

## 2016-05-16 MED ORDER — MELATONIN 3 MG PO TABS
3.0000 mg | ORAL_TABLET | Freq: Every day | ORAL | Status: DC
  Administered 2016-05-16 – 2016-05-20 (×5): 3 mg via ORAL
  Filled 2016-05-16 (×7): qty 1

## 2016-05-16 MED ORDER — COLCHICINE 0.6 MG PO TABS
0.6000 mg | ORAL_TABLET | Freq: Every day | ORAL | Status: DC
  Administered 2016-05-17 – 2016-05-22 (×6): 0.6 mg via ORAL
  Filled 2016-05-16 (×6): qty 1

## 2016-05-16 NOTE — Progress Notes (Signed)
    Subjective:  Patient admitted from the office yesterday. Doesn't feel much different yet. Had some dry heaves and nausea this morning. Still short of breath.  Objective:  Vital Signs in the last 24 hours: Temp:  [98 F (36.7 C)-98.3 F (36.8 C)] 98.3 F (36.8 C) (06/30 0559) Pulse Rate:  [70-88] 70 (06/30 0559) Resp:  [20] 20 (06/30 0559) BP: (109-144)/(38-59) 113/53 mmHg (06/30 0559) SpO2:  [97 %-99 %] 97 % (06/30 0559) Weight:  [223 lb (101.152 kg)-224 lb 12.8 oz (101.969 kg)] 223 lb 8 oz (101.379 kg) (06/30 0559)  Intake/Output from previous day:    Physical Exam: Pt is alert and oriented, pleasant elderly woman in NAD HEENT: normal Neck: JVP - elevated Lungs: CTA bilaterally CV: RRR 2/6 systolic murmur at the right upper sternal border Abd: soft, NT, Positive BS, no hepatomegaly Ext: 1+ pretibial edema, distal pulses intact and equal Skin: warm/dry no rash   Lab Results:  Recent Labs  05/15/16 1654  WBC 5.2  HGB 8.6*  PLT 142*    Recent Labs  05/15/16 1654 05/16/16 0529  NA 144 144  K 3.7 3.8  CL 110 109  CO2 25 25  GLUCOSE 86 96  BUN 14 16  CREATININE 1.38* 1.55*   No results for input(s): TROPONINI in the last 72 hours.  Invalid input(s): CK, MB  Cardiac Studies: 2-D echocardiogram pending  Tele: Paced rhythm  Assessment/Plan:  1. Acute on chronic combined systolic and diastolic heart failure: unable to tolerate CHF medical regimen (beta-blocker/ACE) because of orthostatic hypotension. Increase furosemide to 80 mg IV BID as she hasn't diuresed much yet on 40 mg IV dose.  2. Mitral valve disease status post mitral valve repair: Repeat 2-D echocardiogram is pending.  3. Chronic atrial fibrillation: The patient is not a candidate for anticoagulation because of severe GI bleeding in the past. Heart rate is currently controlled.  4. Orthostatic hypotension: Continues on Midodrine.  5. Chronic kidney disease stage III: Renal function is  essentially stable this morning.  Sherren Mocha, M.D. 05/16/2016, 7:54 AM

## 2016-05-16 NOTE — Care Management Note (Signed)
Case Management Note  Patient Details  Name: [name] MRN: [id number] Date of Birth: [birth year]  Subjective/Objective:   Admitted with CHF              Action/Plan: Patient lives at home with daughter; PCP is Dr Delfina Redwood; has private insurance with [employer] with prescription drug coverage; [facility]; no problem getting her medication. She has scales at home and weighs herself daily;she also eats a diet low in sodium. Patient also states that she does not exercise as much due to back pain. DME -she has a walker at home that she will use whenever she has a gout flare. CM will continue to follow for DCP.  Expected Discharge Date:   possibly 05/17/2016               Expected Discharge Plan:  Home/Self Care  Discharge planning Services  CM Consult   Status of Service:  In process, will continue to follow  Francisca December [id number] 05/16/2016, 2:32 PM

## 2016-05-16 NOTE — Progress Notes (Signed)
Patient transported to radiology. Informed transporter 2D Echo is also ordered and to have test done after xray if possible.  Lasix increased from 40 IV to 80 IV and 1st dose 80 mg given.

## 2016-05-16 NOTE — Progress Notes (Signed)
[name] daughter at bedside and requested update.  Update given with [name] permission.  Stated understanding.

## 2016-05-16 NOTE — Progress Notes (Signed)
Echocardiogram 2D Echocardiogram has been performed with definity.  Aggie Cosier 05/16/2016, 11:57 AM

## 2016-05-17 DIAGNOSIS — I059 Rheumatic mitral valve disease, unspecified: Secondary | ICD-10-CM

## 2016-05-17 LAB — BASIC METABOLIC PANEL
ANION GAP: 9 (ref 5–15)
BUN: 17 mg/dL (ref 6–20)
CALCIUM: 8.3 mg/dL — AB (ref 8.9–10.3)
CHLORIDE: 107 mmol/L (ref 101–111)
CO2: 26 mmol/L (ref 22–32)
CREATININE: 1.41 mg/dL — AB (ref 0.44–1.00)
GFR calc non Af Amer: 32 mL/min — ABNORMAL LOW (ref >60)
GFR, EST AFRICAN AMERICAN: 38 mL/min — AB (ref >60)
GLUCOSE: 113 mg/dL — AB (ref 65–99)
Potassium: 3.4 mmol/L — ABNORMAL LOW (ref 3.5–5.1)
Sodium: 142 mmol/L (ref 135–145)

## 2016-05-17 MED ORDER — METOLAZONE 2.5 MG PO TABS
2.5000 mg | ORAL_TABLET | Freq: Every day | ORAL | Status: DC
  Administered 2016-05-17 – 2016-05-19 (×3): 2.5 mg via ORAL
  Filled 2016-05-17 (×3): qty 1

## 2016-05-17 NOTE — Progress Notes (Signed)
    Subjective:  Only -560 cc negative overnight recorded, despite increase in lasix to 80 mg IV BID. Weight is down 1 lb since admit. Creatinine hovering around 1.3-1.5. BNP only mildly elevated at 205. Echo yesterday shows EF 50-55%, mild AI, mild to moderate surgical mitral stenosis with prior annuloplasty repair.  Objective:  Vital Signs in the last 24 hours: Temp:  [98.1 F (36.7 C)-98.5 F (36.9 C)] 98.5 F (36.9 C) (07/01 0425) Pulse Rate:  [63-94] 73 (07/01 0425) Resp:  [18-20] 20 (07/01 0425) BP: (112-128)/(41-53) 117/41 mmHg (07/01 0425) SpO2:  [93 %-100 %] 95 % (07/01 0425) Weight:  [222 lb 4.8 oz (100.835 kg)] 222 lb 4.8 oz (100.835 kg) (07/01 0425)  Intake/Output from previous day: 06/30 0701 - 07/01 0700 In: 660 [P.O.:660] Out: 1225 [Urine:1225]  Physical Exam: Pt is alert and oriented, pleasant elderly woman in NAD HEENT: normal Neck: JVP - elevated Lungs: CTA bilaterally CV: RRR 2/6 systolic murmur at the right upper sternal border Abd: soft, NT, Positive BS, no hepatomegaly Ext: 1+ pretibial edema, distal pulses intact and equal Skin: warm/dry no rash   Lab Results:  Recent Labs  05/15/16 1654  WBC 5.2  HGB 8.6*  PLT 142*    Recent Labs  05/16/16 0529 05/17/16 0254  NA 144 142  K 3.8 3.4*  CL 109 107  CO2 25 26  GLUCOSE 96 113*  BUN 16 17  CREATININE 1.55* 1.41*   No results for input(s): TROPONINI in the last 72 hours.  Invalid input(s): CK, MB  Cardiac Studies: LV EF: 50% - 55%  ------------------------------------------------------------------- Indications: CHF - 428.0.  ------------------------------------------------------------------- History: PMH: MVR 2003, PHTN, HX OF vtach. Dyspnea. Atrial fibrillation. Congestive heart failure. Risk factors: Hypertension.  ------------------------------------------------------------------- Study Conclusions  - Left ventricle: The cavity size was normal. Wall  thickness was  normal. Systolic function was normal. The estimated ejection  fraction was in the range of 50% to 55%. - Aortic valve: There was mild regurgitation. - Mitral valve: Prior procedures included surgical repair. The  findings are consistent with mild to moderate stenosis. Valve  area by pressure half-time: 2.1 cm^2. - Left atrium: The atrium was mildly dilated. - Right ventricle: The cavity size was mildly dilated. - Tricuspid valve: There was moderate regurgitation  Tele: Paced rhythm  Assessment/Plan:  1. Acute on chronic combined systolic and diastolic heart failure: unable to tolerate CHF medical regimen (beta-blocker/ACE) because of orthostatic hypotension. On furosemide to 80 mg IV BID - add low dose metolazone for additional diuretic effect. JVP is elevated, but not sure she is carrying 20lbs of fluid - weight gain has been over 4-5 months in the face of steroid use. BNP only mildly elevated.  2. Mitral valve disease status post mitral valve repair: LVEF has improved to 50-55%, there is mild to moderate surgical mitral stenosis from repair and mild AI, but only mild LAE  3. Chronic atrial fibrillation: The patient is not a candidate for anticoagulation because of severe GI bleeding in the past. Heart rate is currently controlled.  4. Orthostatic hypotension: Continues on Midodrine.  5. Chronic kidney disease stage III: Renal function is essentially stable this morning.  6. Tachybrady syndrome- BIV pacer which is normally functioning.  Pixie Casino, MD, [facility] Attending Cardiologist  [facility]  Pixie Casino, M.D. 05/17/2016, 10:31 AM

## 2016-05-18 LAB — BASIC METABOLIC PANEL
ANION GAP: 10 (ref 5–15)
BUN: 19 mg/dL (ref 6–20)
CHLORIDE: 103 mmol/L (ref 101–111)
CO2: 28 mmol/L (ref 22–32)
CREATININE: 1.55 mg/dL — AB (ref 0.44–1.00)
Calcium: 8.5 mg/dL — ABNORMAL LOW (ref 8.9–10.3)
GFR calc non Af Amer: 29 mL/min — ABNORMAL LOW (ref >60)
GFR, EST AFRICAN AMERICAN: 34 mL/min — AB (ref >60)
Glucose, Bld: 109 mg/dL — ABNORMAL HIGH (ref 65–99)
Potassium: 3.2 mmol/L — ABNORMAL LOW (ref 3.5–5.1)
Sodium: 141 mmol/L (ref 135–145)

## 2016-05-18 LAB — DIGOXIN LEVEL: Digoxin Level: 0.4 ng/mL — ABNORMAL LOW (ref 0.8–2.0)

## 2016-05-18 MED ORDER — POTASSIUM CHLORIDE CRYS ER 20 MEQ PO TBCR
40.0000 meq | EXTENDED_RELEASE_TABLET | ORAL | Status: AC
Start: 2016-05-18 — End: 2016-05-18
  Administered 2016-05-18: 40 meq via ORAL
  Filled 2016-05-18: qty 2

## 2016-05-18 MED ORDER — POTASSIUM CHLORIDE CRYS ER 20 MEQ PO TBCR
40.0000 meq | EXTENDED_RELEASE_TABLET | Freq: Once | ORAL | Status: AC
  Administered 2016-05-18: 40 meq via ORAL
  Filled 2016-05-18: qty 2

## 2016-05-18 NOTE — Progress Notes (Addendum)
    Subjective:  A little nauseated today. Nice increase in diuresis yesterday with metolazone -2L negative. Small rise in creatinine but still around baseline. Hypokalemic today - apparently on home potassium supplements.   Objective:  Vital Signs in the last 24 hours: Temp:  [98 F (36.7 C)-98.4 F (36.9 C)] 98.4 F (36.9 C) (07/02 0526) Pulse Rate:  [69-79] 79 (07/02 0526) Resp:  [18] 18 (07/02 0526) BP: (107-126)/(46-52) 126/48 mmHg (07/02 0526) SpO2:  [93 %-98 %] 93 % (07/02 0526) Weight:  [218 lb 1.6 oz (98.93 kg)] 218 lb 1.6 oz (98.93 kg) (07/02 0526)  Intake/Output from previous day: 07/01 0701 - 07/02 0700 In: 1040 [P.O.:1040] Out: 3100 [Urine:3100]  Physical Exam: Pt is alert and oriented, pleasant elderly woman in NAD HEENT: normal Neck: JVP - elevated Lungs: CTA bilaterally CV: RRR 2/6 systolic murmur at the right upper sternal border Abd: soft, NT, Positive BS, no hepatomegaly Ext: trace to 1+ edema, distal pulses intact and equal Skin: warm/dry no rash   Lab Results:  Recent Labs  05/15/16 1654  WBC 5.2  HGB 8.6*  PLT 142*    Recent Labs  05/17/16 0254 05/18/16 0407  NA 142 141  K 3.4* 3.2*  CL 107 103  CO2 26 28  GLUCOSE 113* 109*  BUN 17 19  CREATININE 1.41* 1.55*   No results for input(s): TROPONINI in the last 72 hours.  Invalid input(s): CK, MB  Cardiac Studies: LV EF: 50% - 55%  ------------------------------------------------------------------- Indications: CHF - 428.0.  ------------------------------------------------------------------- History: PMH: MVR 2003, PHTN, HX OF vtach. Dyspnea. Atrial fibrillation. Congestive heart failure. Risk factors: Hypertension.  ------------------------------------------------------------------- Study Conclusions  - Left ventricle: The cavity size was normal. Wall thickness was  normal. Systolic function was normal. The estimated ejection  fraction was in the range of  50% to 55%. - Aortic valve: There was mild regurgitation. - Mitral valve: Prior procedures included surgical repair. The  findings are consistent with mild to moderate stenosis. Valve  area by pressure half-time: 2.1 cm^2. - Left atrium: The atrium was mildly dilated. - Right ventricle: The cavity size was mildly dilated. - Tricuspid valve: There was moderate regurgitation  Tele: Paced rhythm  Assessment/Plan:  1. Acute on chronic combined systolic and diastolic heart failure: unable to tolerate CHF medical regimen (beta-blocker/ACE) because of orthostatic hypotension. On furosemide to 80 mg IV BID, metolazole added with marked diuretic effect. Less leg edema. Continue diuresis today.  2. Mitral valve disease status post mitral valve repair: LVEF has improved to 50-55%, there is mild to moderate surgical mitral stenosis from repair and mild AI, but only mild LAE  3. Chronic atrial fibrillation: The patient is not a candidate for anticoagulation because of severe GI bleeding in the past. Heart rate is currently controlled.  4. Orthostatic hypotension: Continues on Midodrine.  5. Chronic kidney disease stage III: Renal function is essentially stable this morning.  6. Tachybrady syndrome- BIV pacer which is normally functioning.  7. Hypokalemia - replete potassium - 80 MEQ po today.  8. Nausea - will provide prn zofran.  May be able to discharge early next week, possibly tomorrow.  Pixie Casino, MD, [facility] Attending Cardiologist  [facility]  Pixie Casino, M.D. 05/18/2016, 9:56 AM

## 2016-05-19 DIAGNOSIS — I482 Chronic atrial fibrillation: Secondary | ICD-10-CM

## 2016-05-19 LAB — BASIC METABOLIC PANEL
ANION GAP: 22 — AB (ref 5–15)
BUN: 25 mg/dL — AB (ref 6–20)
CO2: 29 mmol/L (ref 22–32)
Calcium: 9.7 mg/dL (ref 8.9–10.3)
Chloride: 95 mmol/L — ABNORMAL LOW (ref 101–111)
Creatinine, Ser: 1.66 mg/dL — ABNORMAL HIGH (ref 0.44–1.00)
GFR calc Af Amer: 31 mL/min — ABNORMAL LOW (ref >60)
GFR, EST NON AFRICAN AMERICAN: 27 mL/min — AB (ref >60)
Glucose, Bld: 120 mg/dL — ABNORMAL HIGH (ref 65–99)
POTASSIUM: 3.4 mmol/L — AB (ref 3.5–5.1)
SODIUM: 146 mmol/L — AB (ref 135–145)

## 2016-05-19 LAB — IRON AND TIBC
IRON: 31 ug/dL (ref 28–170)
Saturation Ratios: 7 % — ABNORMAL LOW (ref 10.4–31.8)
TIBC: 451 ug/dL — AB (ref 250–450)
UIBC: 420 ug/dL

## 2016-05-19 LAB — FERRITIN: FERRITIN: 26 ng/mL (ref 11–307)

## 2016-05-19 LAB — CBC
HCT: 29 % — ABNORMAL LOW (ref 36.0–46.0)
Hemoglobin: 8.5 g/dL — ABNORMAL LOW (ref 12.0–15.0)
MCH: 27.9 pg (ref 26.0–34.0)
MCHC: 29.3 g/dL — ABNORMAL LOW (ref 30.0–36.0)
MCV: 95.1 fL (ref 78.0–100.0)
PLATELETS: 170 10*3/uL (ref 150–400)
RBC: 3.05 MIL/uL — ABNORMAL LOW (ref 3.87–5.11)
RDW: 17.2 % — AB (ref 11.5–15.5)
WBC: 5.5 10*3/uL (ref 4.0–10.5)

## 2016-05-19 LAB — MAGNESIUM: MAGNESIUM: 2.1 mg/dL (ref 1.7–2.4)

## 2016-05-19 NOTE — Progress Notes (Signed)
    Subjective:  Feeling much better. Good diuresis. Swelling is down. Did note a black shiny stool. Was on OTC iron prior to admit.  Objective:  Vital Signs in the last 24 hours: Temp:  [97.2 F (36.2 C)-98.7 F (37.1 C)] 98.1 F (36.7 C) (07/03 0844) Pulse Rate:  [69-72] 70 (07/03 0844) Resp:  [18] 18 (07/03 0844) BP: (102-127)/(37-46) 127/45 mmHg (07/03 0844) SpO2:  [96 %-98 %] 96 % (07/03 0844) Weight:  [213 lb 1.6 oz (96.662 kg)-222 lb 4.8 oz (100.835 kg)] 213 lb 1.6 oz (96.662 kg) (07/03 0739)  Intake/Output from previous day: 07/02 0701 - 07/03 0700 In: 58 [P.O.:820] Out: 3500 [Urine:3500]  Physical Exam: Pt is alert and oriented, pleasant elderly woman in NAD HEENT: normal Neck: JVP - normal Lungs: CTA bilaterally CV: RRR 2/6 systolic murmur at the right upper sternal border Abd: soft, NT, Positive BS, no hepatomegaly Ext: tr pretibial edema, distal pulses intact and equal Skin: warm/dry no rash   Lab Results:  Recent Labs  05/19/16 0954  WBC 5.5  HGB 8.5*  PLT 170    Recent Labs  05/18/16 0407 05/19/16 0216  NA 141 146*  K 3.2* 3.4*  CL 103 95*  CO2 28 29  GLUCOSE 109* 120*  BUN 19 25*  CREATININE 1.55* 1.66*   No results for input(s): TROPONINI in the last 72 hours.  Invalid input(s): CK, MB  Cardiac Studies: Echo: Study Conclusions  - Left ventricle: The cavity size was normal. Wall thickness was  normal. Systolic function was normal. The estimated ejection  fraction was in the range of 50% to 55%. - Aortic valve: There was mild regurgitation. - Mitral valve: Prior procedures included surgical repair. The  findings are consistent with mild to moderate stenosis. Valve  area by pressure half-time: 2.1 cm^2. - Left atrium: The atrium was mildly dilated. - Right ventricle: The cavity size was mildly dilated. - Tricuspid valve: There was moderate regurgitation.  Tele: Paced rhythm  Assessment/Plan:  1. Acute on chronic combined  systolic and diastolic heart failure: unable to tolerate CHF medical regimen (beta-blocker/ACE) because of orthostatic hypotension. Increase furosemide to 80 mg IV BID but really didn't start diuresing until she got metolazone. I/O negative 5.3 liters and weight is down 10 lbs since admit. Dry weight closer to 200 lbs. Continue IV lasix for now.  2. Mitral valve disease status post mitral valve repair: Mild to moderate stenosis by Echo. No MR.  3. Chronic atrial fibrillation: The patient is not a candidate for anticoagulation because of severe GI bleeding in the past. Heart rate is currently controlled.  4. Orthostatic hypotension: Continues on Midodrine.  5. Chronic kidney disease stage III: Creatinine is elevated today due to diuresis. Will hold metolazone (already received today) and follow.  6. Chronic anemia. Baseline Hgb 10. Now 8.5. Unchanged from 4 days ago. Heme check stool. Check iron stores.  Peter Martinique, M.D. [facility] 05/19/2016, 1:40 PM

## 2016-05-19 NOTE — Care Management Important Message (Signed)
Important Message  Patient Details  Name: [name] MRN: [id number] Date of Birth: [birth year]   Medicare Important Message Given:  Yes    [name] 05/19/2016, 10:50 AM

## 2016-05-20 DIAGNOSIS — I481 Persistent atrial fibrillation: Secondary | ICD-10-CM

## 2016-05-20 MED ORDER — MIDODRINE HCL 5 MG PO TABS
2.5000 mg | ORAL_TABLET | Freq: Two times a day (BID) | ORAL | Status: DC
  Administered 2016-05-21 – 2016-05-22 (×3): 2.5 mg via ORAL
  Filled 2016-05-20 (×3): qty 1

## 2016-05-20 MED ORDER — FUROSEMIDE 80 MG PO TABS
80.0000 mg | ORAL_TABLET | Freq: Two times a day (BID) | ORAL | Status: DC
  Administered 2016-05-20: 80 mg via ORAL
  Filled 2016-05-20 (×2): qty 1

## 2016-05-20 NOTE — Progress Notes (Signed)
SUBJECTIVE:  Feels better.  Almost back to baseline.    OBJECTIVE:   Vitals:   Filed Vitals:   05/19/16 0844 05/19/16 1300 05/19/16 1952 05/20/16 0503  BP: 127/45 118/47 122/40 98/40  Pulse: 70 72 71 71  Temp: 98.1 F (36.7 C) 98.4 F (36.9 C) 98.6 F (37 C) 98.7 F (37.1 C)  TempSrc: Oral Oral Oral Oral  Resp: 18 18 18 18   Height:      Weight:    209 lb 3.2 oz (94.892 kg)  SpO2: 96% 98% 98% 98%   I&O's:   Intake/Output Summary (Last 24 hours) at 05/20/16 0939 Last data filed at 05/20/16 0931  Gross per 24 hour  Intake   1200 ml  Output   1450 ml  Net   -250 ml   TELEMETRY: Reviewed telemetry pt in paced rhythm:     PHYSICAL EXAM General: Well developed, well nourished, in no acute distress Head:   Normal cephalic and atramatic  Lungs:  Basilar crackles bilaterally to auscultation. Heart:   HRRR S1 S2  No JVD.   Abdomen: abdomen soft and non-tender Msk:  Back normal,  Normal strength and tone for age. Extremities:  Tr bilateral leg edema.   Neuro: Alert and oriented. Psych:  Normal affect, responds appropriately Skin: No rash   LABS: Basic Metabolic Panel:  Recent Labs  05/18/16 0407 05/19/16 0216  NA 141 146*  K 3.2* 3.4*  CL 103 95*  CO2 28 29  GLUCOSE 109* 120*  BUN 19 25*  CREATININE 1.55* 1.66*  CALCIUM 8.5* 9.7  MG  --  2.1   Liver Function Tests: No results for input(s): AST, ALT, ALKPHOS, BILITOT, PROT, ALBUMIN in the last 72 hours. No results for input(s): LIPASE, AMYLASE in the last 72 hours. CBC:  Recent Labs  05/19/16 0954  WBC 5.5  HGB 8.5*  HCT 29.0*  MCV 95.1  PLT 170   Cardiac Enzymes: No results for input(s): CKTOTAL, CKMB, CKMBINDEX, TROPONINI in the last 72 hours. BNP: Invalid input(s): POCBNP D-Dimer: No results for input(s): DDIMER in the last 72 hours. Hemoglobin A1C: No results for input(s): HGBA1C in the last 72 hours. Fasting Lipid Panel: No results for input(s): CHOL, HDL, LDLCALC, TRIG, CHOLHDL,  LDLDIRECT in the last 72 hours. Thyroid Function Tests: No results for input(s): TSH, T4TOTAL, T3FREE, THYROIDAB in the last 72 hours.  Invalid input(s): FREET3 Anemia Panel:  Recent Labs  05/19/16 1609  FERRITIN 26  TIBC 451*  IRON 31   Coag Panel:   Lab Results  Component Value Date   INR 1.58* 08/14/2015   INR 3.1* 07/09/2015   INR 2.6 05/31/2015    RADIOLOGY: Dg Chest 2 View  05/16/2016  CLINICAL DATA:  Shortness of breath, cough EXAM: CHEST  2 VIEW COMPARISON:  08/14/2015 FINDINGS: Cardiomegaly is noted. Status post median sternotomy. 3 leads cardiac pacemaker is unchanged in position. Mild perihilar bronchitic changes. Thoracic spine osteopenia. Mild degenerative changes thoracic spine. No infiltrate or pulmonary edema. IMPRESSION: Cardiomegaly. Three leads cardiac pacemaker is unchanged in position. Mild perihilar bronchitic changes. No infiltrate or pulmonary edema. Electronically Signed   By: Lahoma Crocker M.D.   On: 05/16/2016 08:43      ASSESSMENT: Kathyrn Lass:    1. Acute on chronic combined systolic and diastolic heart failure: did not tolerate beta-blocker/ACE combo because of orthostatic hypotension. At admission, increased furosemide to 80 mg IV BID but really didn't diurese until she got metolazone.  Change IV Lasix to  oral, 80 mg BID.  Holding metolazone.  2. Mitral valve disease status post mitral valve repair: Mild to moderate stenosis by Echo. No MR.  She needs SBE prophylaxis.  3. Chronic atrial fibrillation: The patient is not a candidate for anticoagulation because of severe GI bleeding in the past. Heart rate is currently controlled.  Pacer in place as well.  4. Orthostatic hypotension: Continues on Midodrine.  No ACE-I.  5. Chronic kidney disease stage III: Recheck labs in AM.   6. Possible discharge tomorrow.  Jettie Booze, MD  05/20/2016  9:39 AM

## 2016-05-21 DIAGNOSIS — N179 Acute kidney failure, unspecified: Secondary | ICD-10-CM | POA: Diagnosis present

## 2016-05-21 DIAGNOSIS — N183 Chronic kidney disease, stage 3 (moderate): Secondary | ICD-10-CM

## 2016-05-21 DIAGNOSIS — N189 Chronic kidney disease, unspecified: Secondary | ICD-10-CM

## 2016-05-21 DIAGNOSIS — I951 Orthostatic hypotension: Secondary | ICD-10-CM

## 2016-05-21 DIAGNOSIS — Z9889 Other specified postprocedural states: Secondary | ICD-10-CM

## 2016-05-21 DIAGNOSIS — I5043 Acute on chronic combined systolic (congestive) and diastolic (congestive) heart failure: Secondary | ICD-10-CM | POA: Diagnosis present

## 2016-05-21 LAB — BASIC METABOLIC PANEL
Anion gap: 13 (ref 5–15)
BUN: 40 mg/dL — AB (ref 6–20)
CHLORIDE: 93 mmol/L — AB (ref 101–111)
CO2: 34 mmol/L — AB (ref 22–32)
Calcium: 8.5 mg/dL — ABNORMAL LOW (ref 8.9–10.3)
Creatinine, Ser: 2.21 mg/dL — ABNORMAL HIGH (ref 0.44–1.00)
GFR calc Af Amer: 22 mL/min — ABNORMAL LOW (ref >60)
GFR calc non Af Amer: 19 mL/min — ABNORMAL LOW (ref >60)
GLUCOSE: 111 mg/dL — AB (ref 65–99)
POTASSIUM: 3 mmol/L — AB (ref 3.5–5.1)
Sodium: 140 mmol/L (ref 135–145)

## 2016-05-21 MED ORDER — POTASSIUM CHLORIDE CRYS ER 20 MEQ PO TBCR
20.0000 meq | EXTENDED_RELEASE_TABLET | Freq: Once | ORAL | Status: AC
  Administered 2016-05-21: 20 meq via ORAL
  Filled 2016-05-21: qty 1

## 2016-05-21 MED ORDER — FERROUS SULFATE 325 (65 FE) MG PO TABS
325.0000 mg | ORAL_TABLET | Freq: Two times a day (BID) | ORAL | Status: DC
  Administered 2016-05-21 – 2016-05-22 (×2): 325 mg via ORAL
  Filled 2016-05-21 (×2): qty 1

## 2016-05-21 NOTE — Progress Notes (Signed)
K+ = 3.0 this AM. MD paged. Will continue to monitor.

## 2016-05-21 NOTE — Progress Notes (Signed)
    Subjective:  Feeling well. Good diuresis. No more stools. No dyspnea or chest pain.  Objective:  Vital Signs in the last 24 hours: Temp:  [98.2 F (36.8 C)-98.7 F (37.1 C)] 98.7 F (37.1 C) (07/05 0512) Pulse Rate:  [59-70] 59 (07/05 0512) Resp:  [18] 18 (07/05 0512) BP: (103-115)/(40-61) 110/50 mmHg (07/05 0512) SpO2:  [95 %-98 %] 98 % (07/05 0512) Weight:  [207 lb 4.8 oz (94.031 kg)] 207 lb 4.8 oz (94.031 kg) (07/05 0512)  Intake/Output from previous day: 07/04 0701 - 07/05 0700 In: 1200 [P.O.:1200] Out: 2525 [Urine:2525]  Physical Exam: Pt is alert and oriented, pleasant elderly woman in NAD HEENT: normal Neck: JVP - normal Lungs: CTA bilaterally CV: RRR 2/6 systolic murmur at the right upper sternal border Abd: soft, NT, Positive BS, no hepatomegaly Ext: no edema, distal pulses intact and equal Skin: warm/dry no rash   Lab Results:  Recent Labs  05/19/16 0954  WBC 5.5  HGB 8.5*  PLT 170    Recent Labs  05/19/16 0216 05/21/16 0041  NA 146* 140  K 3.4* 3.0*  CL 95* 93*  CO2 29 34*  GLUCOSE 120* 111*  BUN 25* 40*  CREATININE 1.66* 2.21*   No results for input(s): TROPONINI in the last 72 hours.  Invalid input(s): CK, MB  Cardiac Studies: Echo: Study Conclusions  - Left ventricle: The cavity size was normal. Wall thickness was  normal. Systolic function was normal. The estimated ejection  fraction was in the range of 50% to 55%. - Aortic valve: There was mild regurgitation. - Mitral valve: Prior procedures included surgical repair. The  findings are consistent with mild to moderate stenosis. Valve  area by pressure half-time: 2.1 cm^2. - Left atrium: The atrium was mildly dilated. - Right ventricle: The cavity size was mildly dilated. - Tricuspid valve: There was moderate regurgitation.  Tele: Paced rhythm  Assessment/Plan:  1. Acute on chronic combined systolic and diastolic heart failure: unable to tolerate CHF medical regimen  (beta-blocker/ACE) because of orthostatic hypotension. Increase furosemide to 80 mg IV BID but really didn't start diuresing until she got metolazone. I/O negative 6.7 liters and weight is down 16 lbs since admit. Close to dry weight now. Creatinine increasing. I suspect this is prerenal from diuretics. Will hold lasix today and repeat BMET in am. If improved plan to DC on po lasix.   2. Mitral valve disease status post mitral valve repair: Mild to moderate stenosis by Echo. No MR.  3. Chronic atrial fibrillation: The patient is not a candidate for anticoagulation because of severe GI bleeding in the past. Heart rate is currently controlled and paced.  4. Orthostatic hypotension: Continues on Midodrine.  5. Chronic kidney disease stage III: Creatinine is still increasing today due to diuresis. Will hold all diuretics today and follow.  6. Chronic anemia. Baseline Hgb 10. Now 8.5. Unchanged from 4 days ago. Iron stores low. Will heme check stool. Start po iron therapy  7. Hypokalemia. Replete.  [name], M.D. [facility] 05/21/2016, 8:02 AM

## 2016-05-22 ENCOUNTER — Encounter (HOSPITAL_COMMUNITY): Payer: Self-pay | Admitting: Student

## 2016-05-22 DIAGNOSIS — Z950 Presence of cardiac pacemaker: Secondary | ICD-10-CM

## 2016-05-22 DIAGNOSIS — N183 Chronic kidney disease, stage 3 (moderate): Secondary | ICD-10-CM

## 2016-05-22 LAB — CBC WITH DIFFERENTIAL/PLATELET
Basophils Absolute: 0 10*3/uL (ref 0.0–0.1)
Basophils Relative: 0 %
EOS PCT: 6 %
Eosinophils Absolute: 0.3 10*3/uL (ref 0.0–0.7)
HEMATOCRIT: 28.7 % — AB (ref 36.0–46.0)
Hemoglobin: 8.4 g/dL — ABNORMAL LOW (ref 12.0–15.0)
LYMPHS PCT: 33 %
Lymphs Abs: 1.8 10*3/uL (ref 0.7–4.0)
MCH: 27.4 pg (ref 26.0–34.0)
MCHC: 29.3 g/dL — AB (ref 30.0–36.0)
MCV: 93.5 fL (ref 78.0–100.0)
MONO ABS: 0.8 10*3/uL (ref 0.1–1.0)
MONOS PCT: 14 %
NEUTROS ABS: 2.6 10*3/uL (ref 1.7–7.7)
Neutrophils Relative %: 47 %
Platelets: 190 10*3/uL (ref 150–400)
RBC: 3.07 MIL/uL — ABNORMAL LOW (ref 3.87–5.11)
RDW: 17 % — AB (ref 11.5–15.5)
WBC: 5.5 10*3/uL (ref 4.0–10.5)

## 2016-05-22 LAB — BASIC METABOLIC PANEL
ANION GAP: 10 (ref 5–15)
BUN: 46 mg/dL — AB (ref 6–20)
CO2: 35 mmol/L — AB (ref 22–32)
Calcium: 8.5 mg/dL — ABNORMAL LOW (ref 8.9–10.3)
Chloride: 93 mmol/L — ABNORMAL LOW (ref 101–111)
Creatinine, Ser: 2.02 mg/dL — ABNORMAL HIGH (ref 0.44–1.00)
GFR calc Af Amer: 24 mL/min — ABNORMAL LOW (ref >60)
GFR calc non Af Amer: 21 mL/min — ABNORMAL LOW (ref >60)
GLUCOSE: 112 mg/dL — AB (ref 65–99)
POTASSIUM: 2.8 mmol/L — AB (ref 3.5–5.1)
Sodium: 138 mmol/L (ref 135–145)

## 2016-05-22 MED ORDER — FERROUS SULFATE 325 (65 FE) MG PO TABS: 325.0000 mg | ORAL_TABLET | Freq: Two times a day (BID) | ORAL | Status: AC

## 2016-05-22 MED ORDER — POTASSIUM CHLORIDE CRYS ER 20 MEQ PO TBCR
40.0000 meq | EXTENDED_RELEASE_TABLET | ORAL | Status: AC
Start: 2016-05-22 — End: 2016-05-22
  Administered 2016-05-22 (×2): 40 meq via ORAL
  Filled 2016-05-22 (×2): qty 2

## 2016-05-22 NOTE — Progress Notes (Signed)
    Subjective:  Feeling well.  No more stools. No dyspnea or chest pain.  Objective:  Vital Signs in the last 24 hours: Temp:  [98.2 F (36.8 C)-98.5 F (36.9 C)] 98.3 F (36.8 C) (07/06 0531) Pulse Rate:  [70-71] 71 (07/06 0531) Resp:  [18] 18 (07/06 0531) BP: (120-126)/(35-52) 120/44 mmHg (07/06 0531) SpO2:  [95 %-96 %] 95 % (07/06 0531) Weight:  [207 lb 8 oz (94.121 kg)] 207 lb 8 oz (94.121 kg) (07/06 0531)  Intake/Output from previous day: 07/05 0701 - 07/06 0700 In: 720 [P.O.:720] Out: 750 [Urine:750]  Physical Exam: Pt is alert and oriented, pleasant elderly woman in NAD HEENT: normal Neck: JVP - normal Lungs: CTA bilaterally CV: RRR 2/6 systolic murmur at the right upper sternal border Abd: soft, NT, Positive BS, no hepatomegaly Ext: no edema, distal pulses intact and equal Skin: warm/dry no rash   Lab Results:  Recent Labs  05/19/16 0954 05/22/16 0241  WBC 5.5 5.5  HGB 8.5* 8.4*  PLT 170 190    Recent Labs  05/21/16 0041 05/22/16 0241  NA 140 138  K 3.0* 2.8*  CL 93* 93*  CO2 34* 35*  GLUCOSE 111* 112*  BUN 40* 46*  CREATININE 2.21* 2.02*   No results for input(s): TROPONINI in the last 72 hours.  Invalid input(s): CK, MB  Cardiac Studies: Echo: Study Conclusions  - Left ventricle: The cavity size was normal. Wall thickness was  normal. Systolic function was normal. The estimated ejection  fraction was in the range of 50% to 55%. - Aortic valve: There was mild regurgitation. - Mitral valve: Prior procedures included surgical repair. The  findings are consistent with mild to moderate stenosis. Valve  area by pressure half-time: 2.1 cm^2. - Left atrium: The atrium was mildly dilated. - Right ventricle: The cavity size was mildly dilated. - Tricuspid valve: There was moderate regurgitation.  Tele: Paced rhythm  Assessment/Plan:  1. Acute on chronic combined systolic and diastolic heart failure: unable to tolerate CHF medical  regimen (beta-blocker/ACE) because of orthostatic hypotension. Increase furosemide to 80 mg IV BID but really didn't start diuresing until she got metolazone. I/O negative 6.7 liters and weight is down 16 lbs since admit. Close to dry weight now. Creatinine improved today with holding diuretics yesterday.  I think this is prerenal from diuretics. Will plan discharge home today on prior lasix dose 40 mg bid.   2. Mitral valve disease status post mitral valve repair: Mild to moderate stenosis by Echo. No MR.  3. Chronic atrial fibrillation: The patient is not a candidate for anticoagulation because of severe GI bleeding in the past. Heart rate is currently controlled and paced.  4. Orthostatic hypotension: Continues on Midodrine.  5. Chronic kidney disease stage III: Creatinine is improved today. Will resume Po lasix today. Will need close follow up of renal function as outpatient.   6. Chronic anemia. Baseline Hgb 10. Now 8.4. Unchanged from 4 days ago. Iron stores low.  Start po iron therapy. Follow up as outpatient. May need to consider aranesp given CKD.  7. Hypokalemia. Replete. Will give 80 meq today. Then 40 meq daily. Follow closely as outpatient.   Plan discharge home today. May be a candidate for TOC appt.  Peter Martinique, M.D. [facility] 05/22/2016, 8:05 AM

## 2016-05-22 NOTE — Care Management Note (Signed)
Case Management Note  Patient Details  Name: [name] MRN: [id number] Date of Birth: [birth year]  Action/Plan: CM talked to patient again at bedside with daughter present; patient could benefit from the Disease Management Program; [place] choice offered, daughter chose [name] ( [facility]); Mary with [facility] called for arrangements.  Expected Discharge Date:   05/22/2016               Expected Discharge Plan:  Home/Self Care  Discharge planning Services  CM Consult Choice offered to:  Patient, Adult Children    HH Arranged:  RN, Disease Management, PT, OT [place] Agency:  [facility] (now [facility])  Status of Service:  In process, will continue to follow  [name] [id number] 05/22/2016, 12:38 PM

## 2016-05-22 NOTE — Discharge Summary (Signed)
Discharge Summary    Patient ID: [name],  MRN: [id number], DOB/AGE: 06/21/1926 82 y.o.  Admit date: 05/15/2016 Discharge date: 05/22/2016  Primary Care Provider: [name] D Primary Cardiologist: Dr. Radford Pax  Discharge Diagnoses    Principal Problem:   Acute on chronic combined systolic (congestive) and diastolic (congestive) heart failure (HCC) Active Problems:   Biventricular cardiac pacemaker in situ   Chronic a-fib (HCC)   Orthostatic hypotension   Mitral regurgitation   S/P MVR (mitral valve repair)   Acute on chronic systolic (congestive) heart failure (HCC)   Acute renal failure superimposed on stage 3 chronic kidney disease ([place])   History of Present Illness     [name] is a 82 y.o. female with past medical history of chronic combined systolic/diastolic CHF (EF [id number] by echo in 07/2015), tachy brady syndrome (s/p BiV PPM insertion), severe MR (s/p MV repair in 2003), Stage 3 CKD, chronic atrial fibrillation (not on anticoagulation secondary to GIB), hypotension (on midodrine), and moderate pulmonary HTN who presented to the office on 05/15/2016 and found to be significantly volume overloaded.   She reported a dry weight of 200 lbs, and her weight was 225 lbs at the time of her office visit. She also reported worsening lower extremity edema, dyspnea and orthopnea for the past several weeks. She reported good compliance with PO Lasix and adherence to a low sodium diet. She did report having decreased urine output despite taking Lasix BID. She noted this all started about 1 month ago when she had to take prednisone for an acute gout flare. She was therefore admitted for IV diuresis and further management of her acute on chronic combined systolic and diastolic CHF exacerbation. Creatinine at time of admission was 1.38.   Hospital Course     Consultants: None  She was started on IV Lasix 40mg  BID at time of admission but reported poor output with this,  therefore her Lasix dosing was increased to 80mg  BID. She still only experienced a 1 lb weight decrease, and was started on PO Metolazone 2.5mg  daily. Her diuresis improved to -2L daily, but her creatinine also increased to 1.55. A repeat echocardiogram showed an improved EF of 50-55% with mild to moderate mitral stenosis.   On 05/19/2016, she reported having one tarry stool. Her Hgb was stable at 8.5, consistent with previous values 4 days prior. An occult blood was ordered but this was not obtained prior to discharge as she did not have any more bowel movements. Her iron stores were checked and Ferritin was on the low-side of normal at 26. Therefore she was started on iron supplementation. She will remain off anticoagulation for her atrial fibrillation due to a history of GI bleeds and with her chronic anemia.  Her creatinine went from 1.66 on 7/3 to 2.21 on 7/5, thought to be due to a prerenal etiology from diuretic use. Therefore Lasix and Metolazone were both held. Creatinine improved to 2.02 on 05/22/2016 and she was restarted on her PTA PO Lasix dosing of 40mg  BID. K+ was low at 2.8 on 7/6 and this was appropriately replaced prior to discharge. She has been unable to tolerate BB therapy or an ACE-I due to orthostatic hypotension and was continued on Midodrine for this.   She was last examined by Dr. Martinique and deemed stable for discharge. She had an overall net output of -6.7L this admission and a weight loss of 16 lbs according to hospital scales (223 lbs on admission to  207 lbs at time of discharge). A 7-day TCM appointment has been arranged. She will need a repeat BMET at the time of her office visit to recheck her kidney function and potassium levels. _____________  Discharge Vitals Blood pressure 120/44, pulse 71, temperature 98.3 F (36.8 C), temperature source Oral, resp. rate 18, height 5\' 2"  (1.575 m), weight 207 lb 8 oz (94.121 kg), last menstrual period 10/10/2013, SpO2 95 %.  Filed Weights    05/20/16 0503 05/21/16 0512 05/22/16 0531  Weight: 209 lb 3.2 oz (94.892 kg) 207 lb 4.8 oz (94.031 kg) 207 lb 8 oz (94.121 kg)    Labs & Radiologic Studies     CBC  Recent Labs  05/22/16 0241  WBC 5.5  NEUTROABS 2.6  HGB 8.4*  HCT 28.7*  MCV 93.5  PLT [id number]   Basic Metabolic Panel  Recent Labs  05/21/16 0041 05/22/16 0241  NA 140 138  K 3.0* 2.8*  CL 93* 93*  CO2 34* 35*  GLUCOSE 111* 112*  BUN 40* 46*  CREATININE 2.21* 2.02*  CALCIUM 8.5* 8.5*   Dg Chest 2 View: 05/16/2016  CLINICAL DATA:  Shortness of breath, cough EXAM: CHEST  2 VIEW COMPARISON:  08/14/2015 FINDINGS: Cardiomegaly is noted. Status post median sternotomy. 3 leads cardiac pacemaker is unchanged in position. Mild perihilar bronchitic changes. Thoracic spine osteopenia. Mild degenerative changes thoracic spine. No infiltrate or pulmonary edema. IMPRESSION: Cardiomegaly. Three leads cardiac pacemaker is unchanged in position. Mild perihilar bronchitic changes. No infiltrate or pulmonary edema. Electronically Signed   By: Lahoma Crocker M.D.   On: 05/16/2016 08:43    Diagnostic Studies/Procedures    Echocardiogram: 05/16/2016 Study Conclusions - Left ventricle: The cavity size was normal. Wall thickness was  normal. Systolic function was normal. The estimated ejection  fraction was in the range of 50% to 55%. - Aortic valve: There was mild regurgitation. - Mitral valve: Prior procedures included surgical repair. The  findings are consistent with mild to moderate stenosis. Valve  area by pressure half-time: 2.1 cm^2. - Left atrium: The atrium was mildly dilated. - Right ventricle: The cavity size was mildly dilated. - Tricuspid valve: There was moderate regurgitation.    Disposition   Pt is being discharged home today in good condition.  Follow-up Plans & Appointments    Follow-up Information    Follow up with Cecilie Kicks, NP On 05/26/2016.   Specialties:  Cardiology, Radiology   Why:   Cardiology Hospital Follow-Up on 05/26/2016 at 8:00AM. (Dr. Theodosia Blender Office).   Contact information:   [address] [place] [address] [phone]          Discharge Instructions    Diet - low sodium heart healthy    Complete by:  As directed            Discharge Medications     Medication List    TAKE these medications        acetaminophen 500 MG tablet  Commonly known as:  TYLENOL  Take 1,000 mg by mouth every 6 (six) hours as needed (pain).     allopurinol 300 MG tablet  Commonly known as:  ZYLOPRIM  Take 300 mg by mouth daily.     beta carotene w/minerals tablet  Take 1 tablet by mouth daily after supper.     cetirizine 10 MG tablet  Commonly known as:  ZYRTEC  Take 10 mg by mouth daily.     colchicine 0.6 MG tablet  Take 1  tablet (0.6 mg total) by mouth 2 (two) times daily.     DIGOX 0.125 MG tablet  Generic drug:  digoxin  Take one-half tablet by  mouth daily     ferrous sulfate 325 (65 FE) MG tablet  Take 1 tablet (325 mg total) by mouth 2 (two) times daily with a meal.     furosemide 40 MG tablet  Commonly known as:  LASIX  Take 1 tablet (40 mg total) by mouth 2 (two) times daily.     midodrine 2.5 MG tablet  Commonly known as:  PROAMATINE  Take 2.5 mg by mouth 3 (three) times daily with meals.     multivitamin with minerals Tabs tablet  Take 1 tablet by mouth daily.     pantoprazole 40 MG tablet  Commonly known as:  PROTONIX  Take 40 mg by mouth daily.     potassium chloride SA 20 MEQ tablet  Commonly known as:  K-DUR,KLOR-CON  Take 1 tablet by mouth  daily          Allergies No Known Allergies   Outstanding Labs/Studies   BMET at Follow-Up Appointment  Duration of Discharge Encounter   Greater than 30 minutes including physician time.  Signed, Erma Heritage, PA-C 05/22/2016, 9:58 AM

## 2016-05-22 NOTE — Care Management Important Message (Signed)
Important Message  Patient Details  Name: [name] MRN: [id number] Date of Birth: 02-Apr-1926   Medicare Important Message Given:  Yes    Loann Quill 05/22/2016, 8:58 AM

## 2016-05-25 NOTE — Progress Notes (Signed)
Cardiology Office Note   Date:  05/26/2016   ID:  [name], DOB 1926/06/03, MRN [id number]  PCP:  Kandice Hams, MD  Cardiologist:  Dr. Radford Pax    Chief Complaint  Patient presents with  . Hospitalization Follow-up      History of Present Illness: [name] is a 82 y.o. female who presents for post hospitalization visit.  Admitted 05/15/16 d/c 05/22/16 with acute on chronic HF-combined.  (EF 45-50% by echo in 07/2015), tachy brady syndrome (s/p BiV PPM insertion), severe MR (s/p MV repair in 2003), Stage 3 CKD, chronic atrial fibrillation (not on anticoagulation secondary to GIB), hypotension (on midodrine), and moderate pulmonary HTN, wt up 25 lbs from her dry wt. Despite low Na and and taking her home lasix.  IV diuresis was successful in the hospital on lasix 80 mg BID and metolazone 2.5 mg daily.    A repeat echocardiogram showed an improved EF of 50-55% with mild to moderate mitral stenosis.   She had mild anemia with HGB at 8.5 with low ferritin, Iron was started.  Her anticoagulaion was stopped due to the anemia and hx or GI bleeds.  Cr. Peaked at 2.21 and improved at d/c.  Due to hypotension she was unable to tolerate BB or ACE- she is on Midodrine.  Total -6.7L.  Wt down 16 lbs.  Wt at d/c 207.    TCM appt today   --no phone call- pt without complaints though does have hacky cough.  She does have allergies and post nasal drip.  She weighs daily and is monitoring her salt.  Home Health will come out to help with weight and HF monitoring. Wt stable here and at home.  Her stools are dark due to the iron.     Past Medical History  Diagnosis Date  . Mitral valve disorder     a. Severe MR s/p repair 2003 (28 mm annuloplasty ring and and oversew of LAA). No CAD by cath at that time.  . Pacemaker     a. 2003: post-op afib after MR repair then symptomatic bradycardia requiring pacemaker. b. Upgrade to Medtronic Bi-V Pacemaker 2007.  [name] Cirrhosis ([place])     a. Newly  recognized 09/2013 - by CT.  . CKD (chronic kidney disease)   . Pulmonary hypertension, mild (HCC)     secondary to mild/mod LV dysfunction(EF 40%)  . Diastolic dysfunction   . Spinal stenosis     shes recieved epidural steroid injections in the past  . Chronic combined systolic and diastolic CHF (congestive heart failure) ([place])     a. EF 35-40% on echo 09/2013 b. echo 04/2016: EF 50-55% with mild to moderate MS.  [name] Chronic atrial fibrillation ([place])     a. Previously failed DCCV/amio/tikosyn  . NSVT (nonsustained ventricular tachycardia) ([place])     a. Isolated event during 09/2007 adm.  . Brady-tachy syndrome [facility])     s/p permanent pacemaker placement  . Hypertension   . GI bleeding   . Shortness of breath dyspnea   . Cellulitis and abscess of foot 08/15/2015    RT FOOT    Past Surgical History  Procedure Laterality Date  . Valve replacement      sever mitral regurgitation s/p mitral valve annuloplasty ring  . Cholecystectomy    . Appendectomy    . Irrigation and debridement abscess N/A 10/14/2013    Procedure: IRRIGATION AND DEBRIDEMENT PERINEAL ABSCESS;  Surgeon: Rolm Bookbinder, MD;  Location: [facility];  Service:  General;  Laterality: N/A;  . Biv pacemaker generator change out N/A 12/13/2014    Procedure: BIV PACEMAKER GENERATOR CHANGE OUT;  Surgeon: Evans Lance, MD;  Location: [facility] CATH LAB;  Service: Cardiovascular;  Laterality: N/A;     Current Outpatient Prescriptions  Medication Sig Dispense Refill  . acetaminophen (TYLENOL) 500 MG tablet Take 1,000 mg by mouth every 6 (six) hours as needed (pain).    [name] allopurinol (ZYLOPRIM) 300 MG tablet Take 300 mg by mouth daily.    . beta carotene w/minerals (OCUVITE) tablet Take 1 tablet by mouth daily after supper.     . cetirizine (ZYRTEC) 10 MG tablet Take 10 mg by mouth daily.     . colchicine 0.6 MG tablet Take 0.6 mg by mouth daily.    [name] DIGOX 125 MCG tablet Take one-half tablet by  mouth daily 45 tablet 11  . ferrous  sulfate 325 (65 FE) MG tablet Take 1 tablet (325 mg total) by mouth 2 (two) times daily with a meal. 60 tablet 6  . furosemide (LASIX) 40 MG tablet Take 1 tablet (40 mg total) by mouth 2 (two) times daily. 90 tablet 2  . midodrine (PROAMATINE) 2.5 MG tablet Take 2.5 mg by mouth 3 (three) times daily with meals.     . Multiple Vitamin (MULTIVITAMIN WITH MINERALS) TABS tablet Take 1 tablet by mouth daily.    . pantoprazole (PROTONIX) 40 MG tablet Take 40 mg by mouth daily.    . potassium chloride SA (K-DUR,KLOR-CON) 20 MEQ tablet Take 1 tablet by mouth  daily 90 tablet 3   No current facility-administered medications for this visit.    Allergies:   Review of patient's allergies indicates no known allergies.    Social History:  The patient  reports that she quit smoking about 44 years ago. She has never used smokeless tobacco. She reports that she drinks alcohol. She reports that she does not use illicit drugs.   Family History:  [name] family history includes Other in her mother; Pancreatic cancer in her father.    ROS:  General:no colds or fevers, no weight changes Skin:no rashes or ulcers HEENT:no blurred vision, no congestion CV:see HPI PUL:see HPI GI:no diarrhea constipation or melena, no indigestion-dark stools with Iron GU:no hematuria, no dysuria MS:no joint pain, no claudication Neuro:no syncope, no lightheadedness Endo:no diabetes, no thyroid disease  Wt Readings from Last 3 Encounters:  05/26/16 207 lb (93.895 kg)  05/22/16 207 lb 8 oz (94.121 kg)  05/15/16 224 lb 12.8 oz (101.969 kg)     PHYSICAL EXAM: VS:  BP 118/50 mmHg  Pulse 93  Ht 5\' 2"  (1.575 m)  Wt 207 lb (93.895 kg)  BMI 37.85 kg/m2  SpO2 98%  LMP 10/10/2013 , BMI Body mass index is 37.85 kg/(m^2). General:Pleasant affect, NAD Skin:Warm and dry, brisk capillary refill HEENT:normocephalic, sclera clear, mucus membranes moist Neck:supple, no JVD, no bruits  Heart:S1S2 RRR with soft systolic murmur,  no gallup, rub or click Lungs:clear without rales, rhonchi, or wheezes [id number], non tender, + BS, do not palpate liver spleen or masses Ext:1+ lower ext edema, 2+ pedal pulses, 2+ radial pulses Neuro:alert and oriented X 3, MAE, follows commands, + facial symmetry    EKG:  EKG is NOT ordered today.    Recent Labs: 02/11/2016: ALT 49 05/15/2016: B Natriuretic Peptide 205.5*; TSH 6.805* 05/19/2016: Magnesium 2.1 05/22/2016: BUN 46*; Creatinine, Ser 2.02*; Hemoglobin 8.4*; Platelets 190; Potassium 2.8*; Sodium 138    Lipid Panel No  results found for: CHOL, TRIG, HDL, CHOLHDL, VLDL, LDLCALC, LDLDIRECT     Other studies Reviewed: Additional studies/ records that were reviewed today include: labs, hospital notes previous OV notes.  Echo: Study Conclusions  - Left ventricle: The cavity size was normal. Wall thickness was  normal. Systolic function was normal. The estimated ejection  fraction was in the range of 50% to 55%. - Aortic valve: There was mild regurgitation. - Mitral valve: Prior procedures included surgical repair. The  findings are consistent with mild to moderate stenosis. Valve  area by pressure half-time: 2.1 cm^2. - Left atrium: The atrium was mildly dilated. - Right ventricle: The cavity size was mildly dilated. - Tricuspid valve: There was moderate regurgitation.  ASSESSMENT AND PLAN:   Acute on chronic combined systolic (congestive) and diastolic (congestive) heart failure (HCC) improved continue lasix BID unless Cr is elevated.  Discussed knee high support stockings at drug store she has spent $ 100 on thigh high stockings that she cannot get on.    Biventricular cardiac pacemaker in situ  Chronic a-fib (HCC) rate controlled  Orthostatic hypotension stable currently  Mitral regurgitation  S/P MVR (mitral valve repair)   Acute renal failure superimposed on stage 3 chronic kidney disease (HCC) check BMP today  Anemia with low ferritin levels. On  iron will check CBC.    Current medicines are reviewed with the patient today.  The patient Has no concerns regarding medicines.  The following changes have been made:  See above Labs/ tests ordered today include:see above  Disposition:   FU:  see above  Signed, Cecilie Kicks, NP  05/26/2016 8:32 AM    [address], [place], [place], [place] Phone: [phone]; Fax: [phone]

## 2016-05-26 ENCOUNTER — Telehealth: Payer: Self-pay | Admitting: Interventional Cardiology

## 2016-05-26 ENCOUNTER — Ambulatory Visit (INDEPENDENT_AMBULATORY_CARE_PROVIDER_SITE_OTHER): Payer: Medicare Other | Admitting: Cardiology

## 2016-05-26 ENCOUNTER — Encounter: Payer: Self-pay | Admitting: Cardiology

## 2016-05-26 VITALS — BP 118/50 | HR 93 | Ht 62.0 in | Wt 207.0 lb

## 2016-05-26 DIAGNOSIS — I5023 Acute on chronic systolic (congestive) heart failure: Secondary | ICD-10-CM | POA: Diagnosis not present

## 2016-05-26 DIAGNOSIS — I10 Essential (primary) hypertension: Secondary | ICD-10-CM

## 2016-05-26 DIAGNOSIS — I482 Chronic atrial fibrillation, unspecified: Secondary | ICD-10-CM

## 2016-05-26 DIAGNOSIS — D509 Iron deficiency anemia, unspecified: Secondary | ICD-10-CM

## 2016-05-26 DIAGNOSIS — I5042 Chronic combined systolic (congestive) and diastolic (congestive) heart failure: Secondary | ICD-10-CM

## 2016-05-26 DIAGNOSIS — I951 Orthostatic hypotension: Secondary | ICD-10-CM

## 2016-05-26 DIAGNOSIS — Z9889 Other specified postprocedural states: Secondary | ICD-10-CM

## 2016-05-26 DIAGNOSIS — N183 Chronic kidney disease, stage 3 (moderate): Secondary | ICD-10-CM

## 2016-05-26 LAB — BASIC METABOLIC PANEL
BUN: 47 mg/dL — AB (ref 7–25)
CALCIUM: 8.8 mg/dL (ref 8.6–10.4)
CO2: 30 mmol/L (ref 20–31)
CREATININE: 1.87 mg/dL — AB (ref 0.60–0.88)
Chloride: 101 mmol/L (ref 98–110)
Glucose, Bld: 132 mg/dL — ABNORMAL HIGH (ref 65–99)
Potassium: 3.3 mmol/L — ABNORMAL LOW (ref 3.5–5.3)
Sodium: 143 mmol/L (ref 135–146)

## 2016-05-26 LAB — CBC WITH DIFFERENTIAL/PLATELET
BASOS PCT: 1 %
Basophils Absolute: 52 cells/uL (ref 0–200)
EOS ABS: 312 {cells}/uL (ref 15–500)
EOS PCT: 6 %
HCT: 27.2 % — ABNORMAL LOW (ref 35.0–45.0)
Hemoglobin: 8.5 g/dL — ABNORMAL LOW (ref 11.7–15.5)
LYMPHS PCT: 31 %
Lymphs Abs: 1612 cells/uL (ref 850–3900)
MCH: 28 pg (ref 27.0–33.0)
MCHC: 31.3 g/dL — ABNORMAL LOW (ref 32.0–36.0)
MCV: 89.5 fL (ref 80.0–100.0)
MONOS PCT: 11 %
MPV: 12 fL (ref 7.5–12.5)
Monocytes Absolute: 572 cells/uL (ref 200–950)
Neutro Abs: 2652 cells/uL (ref 1500–7800)
Neutrophils Relative %: 51 %
PLATELETS: 153 10*3/uL (ref 140–400)
RBC: 3.04 MIL/uL — AB (ref 3.80–5.10)
RDW: 17.7 % — AB (ref 11.0–15.0)
WBC: 5.2 10*3/uL (ref 3.8–10.8)

## 2016-05-26 NOTE — Telephone Encounter (Signed)
New Message:   Need continuing orders for Cardiac Heart Program and also Renal Failure. She also would like to have Physical Therapy for safe stride program,because of her balance.

## 2016-05-26 NOTE — Patient Instructions (Signed)
Medication Instructions:  Your physician recommends that you continue on your current medications as directed. Please refer to the Current Medication list given to you today.   Labwork: Bmet, Cbc today   Testing/Procedures: None ordered  Follow-Up: Your physician recommends that you schedule a follow-up appointment in: 2 weeks with Sandria Senter or an APP  Your physician recommends that you schedule a follow-up appointment in: 2 months with Dr.Turner    Any Other Special Instructions Will Be Listed Below (If Applicable).     If you need a refill on your cardiac medications before your next appointment, please call your pharmacy.

## 2016-05-26 NOTE — Telephone Encounter (Signed)
**Note De-Identified  Obfuscation** Kendrick Fries, Herbalist, is advised to refer to the pts PCP for these orders. She verbalized understanding and states that she will notify Olin Hauser of recommendation.

## 2016-05-27 ENCOUNTER — Telehealth: Payer: Self-pay | Admitting: *Deleted

## 2016-05-27 DIAGNOSIS — I482 Chronic atrial fibrillation, unspecified: Secondary | ICD-10-CM

## 2016-05-27 MED ORDER — POTASSIUM CHLORIDE CRYS ER 20 MEQ PO TBCR: EXTENDED_RELEASE_TABLET | ORAL | Status: DC

## 2016-05-27 NOTE — Telephone Encounter (Signed)
-----   Message from Isaiah Serge, NP sent at 05/26/2016 11:10 PM EDT ----- Please notify Pt hgb stable continue iron, it may take a couple of weeks to improve more so- needs CBC and BMP in 2 weeks with next appt.  Her Cr. Is stable to a little improved, continue current dose of lasix.  Call if wt gain.  K+ level is low take an extra Kdur today and tomorrow then 1 daily.

## 2016-05-30 ENCOUNTER — Telehealth: Payer: Self-pay | Admitting: Cardiology

## 2016-05-30 ENCOUNTER — Telehealth: Payer: Self-pay | Admitting: Interventional Cardiology

## 2016-05-30 NOTE — Telephone Encounter (Signed)
Patient reports she gained 3 pounds overnight night before last. She st when she left the hospital she weighed 207 (with the hospital scale). Now she weighs 209.5 at home, but does not remember her first at -home reading. She was assessed by her [facility] RN today and was told she has no swelling. She has no symptoms. Confirmed with patient she is taking her medications as directed. Instructed patient to continue to monitor weight at home and to call if weight continues to increase or if symptoms occur. She was grateful for call.

## 2016-05-30 NOTE — Telephone Encounter (Signed)
New message   [name] is calling from [place] from home,  Pt has had a 3 pound weight gain over night

## 2016-05-30 NOTE — Telephone Encounter (Signed)
New message  Physical therapy is calling requesting once a week for the first week and then twice a week for the next 6 weeks.

## 2016-05-30 NOTE — Telephone Encounter (Signed)
This was ordered by cardiology at the pts hosp d/c.  Please advise.

## 2016-06-02 ENCOUNTER — Telehealth: Payer: Self-pay | Admitting: Cardiology

## 2016-06-02 NOTE — Telephone Encounter (Signed)
New message   Levada Dy of [facility] expressed that the pt has had a weight gain 6lbs since 05/30/16, lungs are clear, no chest pain or shortness of breath. Tracedma to lower extremities.

## 2016-06-02 NOTE — Telephone Encounter (Signed)
Change to Torsemide 20mg  BID

## 2016-06-02 NOTE — Telephone Encounter (Signed)
HH RN Levada Dy called to report the patient has gained 6 pounds since Friday.  Friday, she weighed 209. Today, 215.  Levada Dy states the patient denies CP and SOB, her lungs are clear, her VS are "good," and she has been limiting her fluid intake.  She does report trace edema to BLE. [name] only complaint is that she "doesn't feel like Lasix is working anymore."  When she goes to the bathroom, she doesn't feel like she urinates as much. Confirmed there is no pain while urinating. Confirmed weights were taken on the same scale at the same time of day.  To Dr. Radford Pax for recommendations.

## 2016-06-02 NOTE — Telephone Encounter (Signed)
I saw her once on July 4th.  I think she is Dr. Theodosia Blender patient.

## 2016-06-03 MED ORDER — TORSEMIDE 20 MG PO TABS: 20.0000 mg | ORAL_TABLET | Freq: Two times a day (BID) | ORAL | Status: DC

## 2016-06-03 NOTE — Telephone Encounter (Signed)
**Note De-Identified  Obfuscation** You saw her in [place] on 05/15/16.

## 2016-06-03 NOTE — Telephone Encounter (Signed)
Instructed patient to STOP LASIX and START TORSEMIDE 20 mg BID. Patient agrees with treatment plan.

## 2016-06-03 NOTE — Telephone Encounter (Signed)
Dr. Landis Gandy patient; admitted from the office when I was DOD.

## 2016-06-04 NOTE — Telephone Encounter (Signed)
Fax received, reviewed and signed by Dr. Radford Pax and placed in Medical Records' "to be faxed" bin.

## 2016-06-06 ENCOUNTER — Telehealth: Payer: Self-pay | Admitting: Cardiology

## 2016-06-06 DIAGNOSIS — I482 Chronic atrial fibrillation, unspecified: Secondary | ICD-10-CM

## 2016-06-06 DIAGNOSIS — I5043 Acute on chronic combined systolic (congestive) and diastolic (congestive) heart failure: Secondary | ICD-10-CM

## 2016-06-06 NOTE — Telephone Encounter (Signed)
Pt states 06/03/16 lasix was stopped and changed to torsemide 20mg  bid.

## 2016-06-06 NOTE — Telephone Encounter (Signed)
Pt states BP this AM was 106/54 pulse 75. Pt denies increase in LE edema, some increase in shortness of breath with exercise but okay at rest.

## 2016-06-06 NOTE — Telephone Encounter (Signed)
New message      On May 30, 2016 pt weighed 209.5 lbs and today 06/06/16 she weighs 217. She stated she was told to call if she had a weight gain. Please advise.

## 2016-06-06 NOTE — Telephone Encounter (Signed)
Reviewed with Dr Meda Coffee-  Per Dr Meda Coffee -increase torsemide to 40mg  bid for 5 days, increase KCL to 20 mEq bid for 5 days, BMET 06/13/16. Pt advised, verbalized understanding. Pt will continue to weigh daily and monitor symptoms.

## 2016-06-13 ENCOUNTER — Ambulatory Visit (INDEPENDENT_AMBULATORY_CARE_PROVIDER_SITE_OTHER): Payer: Medicare Other | Admitting: Cardiology

## 2016-06-13 ENCOUNTER — Other Ambulatory Visit: Payer: Medicare Other

## 2016-06-13 VITALS — BP 138/80 | HR 89 | Ht 62.0 in | Wt 216.2 lb

## 2016-06-13 DIAGNOSIS — I482 Chronic atrial fibrillation, unspecified: Secondary | ICD-10-CM

## 2016-06-13 LAB — BASIC METABOLIC PANEL
BUN: 21 mg/dL (ref 7–25)
CO2: 24 mmol/L (ref 20–31)
Calcium: 8.7 mg/dL (ref 8.6–10.4)
Chloride: 108 mmol/L (ref 98–110)
Creat: 1.55 mg/dL — ABNORMAL HIGH (ref 0.60–0.88)
Glucose, Bld: 99 mg/dL (ref 65–99)
POTASSIUM: 3.9 mmol/L (ref 3.5–5.3)
SODIUM: 144 mmol/L (ref 135–146)

## 2016-06-13 NOTE — Progress Notes (Signed)
06/13/2016 [name]   Sep 28, 1926  [id number]  Primary Physician Kandice Hams, MD Primary Cardiologist: Dr. Radford Pax Electrophysiologist: Dr. Lovena Le  Reason for Visit/CC: f/u for acute on chronic diastolic CHF  HPI:   [name] is a 82 y.o. 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 She was felt to be in acute on chronic CHF and admitted for IV diuretics.  IV diuresis was successful in the hospital on lasix 80 mg BID and metolazone 2.5 mg daily.  However she had worsening renal function with increase in the serum creatinine above 2. Subsequently metolazone was discontinued. A repeat echocardiogram showed an improved EF of 50-55% with mild to moderate mitral stenosis. Due to hypotension she was unable to tolerate BB or ACE- I. She is on Midodrine.  Since discharge, she continued to have recurrent dyspnea and bilateral lower extremity edema + weight gain. He was given instructions over the phone to discontinue Lasix and to change her diuretic to torsemide, 20 mg twice a day.  Since back to clinic today for follow-up for this. He notes better urinary output with torsemide compared to Lasix however she continues to note subjective dyspnea. She appears comfortable at rest. No signs or respiratory distress. He notes mild dyspnea with exertion. She appears euvolemic physical exam. She and her daughter both have concerns regarding increasing diuretics dosing and renal function.    Current Outpatient Prescriptions  Medication Sig Dispense Refill  . acetaminophen (TYLENOL) 500 MG tablet Take 1,000 mg by mouth every 6 (six)  hours as needed (pain).    [name] allopurinol (ZYLOPRIM) 300 MG tablet Take 300 mg by mouth daily.    . beta carotene w/minerals (OCUVITE) tablet Take 1 tablet by mouth daily after supper.     . cetirizine (ZYRTEC) 10 MG tablet Take 10 mg by mouth daily.     . colchicine 0.6 MG tablet Take 0.6 mg by mouth daily.    [name] DIGOX 125 MCG tablet Take one-half tablet by  mouth daily 45 tablet 11  . ferrous sulfate 325 (65 FE) MG tablet Take 1 tablet (325 mg total) by mouth 2 (two) times daily with a meal. 60 tablet 6  . midodrine (PROAMATINE) 2.5 MG tablet Take 2.5 mg by mouth 3 (three) times daily with meals.     . Multiple Vitamin (MULTIVITAMIN WITH MINERALS) TABS tablet Take 1 tablet by mouth daily.    . pantoprazole (PROTONIX) 40 MG tablet Take 40 mg by mouth daily.    . potassium chloride SA (K-DUR,KLOR-CON) 20 MEQ tablet Take 1 tablet by mouth twice a day X's 5 days then take 1 tablet by mouth daily 90 tablet 3  . torsemide (DEMADEX) 20 MG tablet Take 1 tablet (20 mg total) by mouth 2 (two) times daily. Take 2 tablets (40mg ) by mouth two times a day for 5 days, then take 1 tablet (20 mg)by mouth two times a day     No current facility-administered medications for this visit.     No Known Allergies  Social History   Social History  . Marital status: Single    Spouse name: N/A  . Number of children: N/A  .  Years of education: N/A   Occupational History  . Not on file.   Social History Main Topics  . Smoking status: Former Smoker    Quit date: 10/11/1971  . Smokeless tobacco: Never Used  . Alcohol use 0.0 oz/week     Comment: rare  . Drug use: No  . Sexual activity: Not on file   Other Topics Concern  . Not on file   Social History Narrative  . No narrative on file     Review of Systems: General: negative for chills, fever, night sweats or weight changes.  Cardiovascular: negative for chest pain, dyspnea on exertion, edema, orthopnea, palpitations, paroxysmal nocturnal dyspnea or  shortness of breath Dermatological: negative for rash Respiratory: negative for cough or wheezing Urologic: negative for hematuria Abdominal: negative for nausea, vomiting, diarrhea, bright red blood per rectum, melena, or hematemesis Neurologic: negative for visual changes, syncope, or dizziness All other systems reviewed and are otherwise negative except as noted above.    Blood pressure 138/80, pulse 89, height 5\' 2"  (1.575 m), weight 216 lb 3.2 oz (98.1 kg), last menstrual period 10/10/2013.  General appearance: alert, cooperative and no distress Neck: no carotid bruit and no JVD Lungs: clear to auscultation bilaterally Heart: irregular rhythm,  Regular rate, normal S1, S2 normal, no murmur, click, rub or gallop Extremities: trace bilateral LEE Pulses: 2+ and symmetric Skin: warm and dry Neurologic: Grossly normal  EKG not performed.   ASSESSMENT AND PLAN:   1. Chronic Diastolic CHF:  Patient appears euvolemic on physical exam however she continues to note mild subjective dyspnea on exertion. She appears comfortable at rest. Her diuretic was recently changed from Lasix to torsemide given increased fluid retention. Since starting torsemide she reports increased urinary output. We will check a follow-up BMP today to ensure renal function and potassium levels are stable. We will also consult her primary cardiologist, Dr. Radford Pax, to weigh in on diuretic dosing as she has had issues with increased serum creatinine on higher doses of diuretics. Low-sodium diet and daily weights advised. For now she is to continue torsemide, 20 mg twice a day. She can take an extra torsemide tablet if she gains 3 pounds or more in a 24 hr periord.   PLAN  Keep f/u with Dr. Radford Pax in 1 month.   Lyda Jester PA-C 06/13/2016 3:22 PM

## 2016-06-13 NOTE — Patient Instructions (Signed)
Medication Instructions:  Your physician recommends that you continue on your current medications as directed. Please refer to the Current Medication list given to you today.  Labwork: TODAY: BMET  Testing/Procedures: NONE  Follow-Up: Your physician recommends that you keep your upcoming follow-up appointment in September.    Any Other Special Instructions Will Be Listed Below (If Applicable).     If you need a refill on your cardiac medications before your next appointment, please call your pharmacy.

## 2016-06-16 ENCOUNTER — Telehealth: Payer: Self-pay | Admitting: Cardiology

## 2016-06-16 ENCOUNTER — Encounter: Payer: Self-pay | Admitting: Cardiology

## 2016-06-16 MED ORDER — TORSEMIDE 20 MG PO TABS: 20.0000 mg | ORAL_TABLET | Freq: Two times a day (BID) | ORAL | 3 refills | Status: DC

## 2016-06-16 NOTE — Telephone Encounter (Signed)
Reviewed with Ellen Henri, PA and kidney function/potassium OK.  [place] will discuss with Dr. Radford Pax regarding treatment plan. I spoke with pt's daughter and gave her this information. I told her we would call her back with Dr.Turner's recommendations.

## 2016-06-16 NOTE — Telephone Encounter (Signed)
As long as she is euvolemic on exam would continue demadex 20mg  BID and she needs to weight daily and if gains more than 3 lbs in 1 day take an extra demadex.

## 2016-06-16 NOTE — Telephone Encounter (Signed)
New Message  Pt daughter call requesting to speak with RN about pts labs results. Please call back to discuss

## 2016-06-16 NOTE — Telephone Encounter (Signed)
I spoke with pt's daughter and gave her instructions from Dr. Radford Pax.  They do not need new prescription sent in.

## 2016-07-08 ENCOUNTER — Encounter: Payer: Self-pay | Admitting: Cardiology

## 2016-07-10 ENCOUNTER — Other Ambulatory Visit: Payer: Self-pay | Admitting: Cardiology

## 2016-07-23 ENCOUNTER — Encounter: Payer: Self-pay | Admitting: Cardiology

## 2016-07-23 ENCOUNTER — Telehealth: Payer: Self-pay | Admitting: Cardiology

## 2016-07-23 ENCOUNTER — Ambulatory Visit (INDEPENDENT_AMBULATORY_CARE_PROVIDER_SITE_OTHER): Payer: Medicare Other | Admitting: Cardiology

## 2016-07-23 VITALS — BP 120/62 | HR 95 | Ht 62.0 in | Wt 210.8 lb

## 2016-07-23 DIAGNOSIS — I10 Essential (primary) hypertension: Secondary | ICD-10-CM

## 2016-07-23 DIAGNOSIS — I5042 Chronic combined systolic (congestive) and diastolic (congestive) heart failure: Secondary | ICD-10-CM | POA: Diagnosis not present

## 2016-07-23 DIAGNOSIS — I951 Orthostatic hypotension: Secondary | ICD-10-CM

## 2016-07-23 DIAGNOSIS — I482 Chronic atrial fibrillation, unspecified: Secondary | ICD-10-CM

## 2016-07-23 DIAGNOSIS — I059 Rheumatic mitral valve disease, unspecified: Secondary | ICD-10-CM

## 2016-07-23 DIAGNOSIS — G903 Multi-system degeneration of the autonomic nervous system: Secondary | ICD-10-CM

## 2016-07-23 LAB — BASIC METABOLIC PANEL
BUN: 21 mg/dL (ref 7–25)
CALCIUM: 8.6 mg/dL (ref 8.6–10.4)
CHLORIDE: 107 mmol/L (ref 98–110)
CO2: 26 mmol/L (ref 20–31)
CREATININE: 1.5 mg/dL — AB (ref 0.60–0.88)
Glucose, Bld: 110 mg/dL — ABNORMAL HIGH (ref 65–99)
Potassium: 3.6 mmol/L (ref 3.5–5.3)
SODIUM: 146 mmol/L (ref 135–146)

## 2016-07-23 NOTE — Progress Notes (Signed)
Cardiology Office Note    Date:  07/23/2016   ID:  [name], DOB [birth year], MRN [id number]  PCP:  Kandice Hams, MD  Cardiologist:  [name], MD   Chief Complaint  Patient presents with  . Hypertension  . Atrial Fibrillation  . Congestive Heart Failure    History of Present Illness:  [name] is a 82 y.o. 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 She was felt to be in acute on chronic CHF and admitted for IV diuretics. IV diuresis was successful in the hospital on lasix 80 mg BID and metolazone 2.5 mg daily. However she had worsening renal function with increase in the serum creatinine above 2. Subsequently metolazone was discontinued. A repeat echocardiogram showed an improved EF of 50-55% with mild to moderate mitral stenosis. Due to hypotension she was unable to tolerate BB or ACE- I. She is on Midodrine.  After discharge she continued to have recurrent dyspnea and bilateral lower extremity edema + weight gain. She was given instructions over the phone to discontinue Lasix and to change her diuretic to torsemide, 20 mg twice a day.  She was seen back in clinic by extender for follow-up. She noted better urinary output with torsemide compared to Lasix however she continued to note subjective dyspnea.  On exam she appeared euvolemic.  She was continued on Torsemide 20mg  BID and given a SS diuretic regimen if she gains 3 lbs in 1 day.   She now presents back today for evaluation.  Her weight at home as been very stable around 206lbs over the past 1-2 weeks.  She has  only had to take an extra torsemide once in the past 2 weeks.  She still has chronic DOE but appears stable.  She is able to get around and do her daily activities without too much limitation.  She has no LE edema.  She denies any chest pain, palpitations, dizziness or syncope.    Past Medical History:  Diagnosis Date  . Brady-tachy syndrome ([place])    a. 2003: post-op afib after MR repair then symptomatic bradycardia requiring pacemaker. b. Upgrade to Medtronic Bi-V Pacemaker 2007.  . Cellulitis and abscess of foot 08/15/2015   RT FOOT  . Chronic atrial fibrillation ([place])    a. Previously failed DCCV/amio/tikosyn.  CHADS2VASC score 5 - not on anticoagulation due to history of GI bleeding.    . Chronic combined systolic and diastolic CHF (congestive heart failure) (H[facility])    a. EF 35-40% on echo 09/2013 b. echo 04/2016: EF 50-55% with mild to moderate MS.  . Cirrhosis ([place])    a. Newly recognized 09/2013 - by CT.  . CKD (chronic kidney disease)   . GI bleeding   . Hypertension   . Mitral valve disorder    a. Severe MR s/p repair 2003 (28 mm annuloplasty ring and and oversew of LAA). No CAD by cath at that time.  [name] NSVT (nonsustained ventricular tachycardia) ([place])    a. Isolated event during 09/2007 adm.  . Orthostatic hypotension dysautonomic syndrome ([place])   . Pulmonary hypertension (HCC)    Group II secondary to CHF  and MV disease  . Spinal stenosis    shes recieved epidural steroid injections in the past    Past Surgical History:  Procedure Laterality Date  . APPENDECTOMY    . BIV PACEMAKER GENERATOR CHANGE OUT N/A 12/13/2014   Procedure: BIV PACEMAKER GENERATOR CHANGE OUT;  Surgeon: Evans Lance, MD;  Location: [facility] CATH LAB;  Service: Cardiovascular;  Laterality: N/A;  . CHOLECYSTECTOMY    . IRRIGATION AND DEBRIDEMENT ABSCESS N/A 10/14/2013   Procedure: IRRIGATION AND DEBRIDEMENT PERINEAL ABSCESS;  Surgeon: Rolm Bookbinder, MD;  Location: [facility];  Service: General;  Laterality:  N/A;  . VALVE REPLACEMENT     sever mitral regurgitation s/p mitral valve annuloplasty ring    Current Medications: Outpatient Medications Prior to Visit  Medication Sig Dispense Refill  . acetaminophen (TYLENOL) 500 MG tablet Take 1,000 mg by mouth every 6 (six) hours as needed (pain).    [name] allopurinol (ZYLOPRIM) 300 MG tablet Take 300 mg by mouth daily.    . beta carotene w/minerals (OCUVITE) tablet Take 1 tablet by mouth daily after supper.     . cetirizine (ZYRTEC) 10 MG tablet Take 10 mg by mouth daily.     . colchicine 0.6 MG tablet Take 0.6 mg by mouth daily.    [name] DIGOX 125 MCG tablet Take one-half tablet by  mouth daily 45 tablet 11  . ferrous sulfate 325 (65 FE) MG tablet Take 1 tablet (325 mg total) by mouth 2 (two) times daily with a meal. 60 tablet 6  . Multiple Vitamin (MULTIVITAMIN WITH MINERALS) TABS tablet Take 1 tablet by mouth daily.    . pantoprazole (PROTONIX) 40 MG tablet Take 40 mg by mouth daily.    . potassium chloride SA (K-DUR,KLOR-CON) 20 MEQ tablet Take 20 mEq by mouth daily.  90 tablet 3  . torsemide (DEMADEX) 20 MG tablet Take 1 tablet (20 mg total) by mouth 2 (two) times daily. Take extra tablet if weight gain greater than 3 lbs in one day 75 tablet 3  . midodrine (PROAMATINE) 2.5 MG tablet TAKE 1 TABLET THREE TIMES A DAY WITH MEALS 90 tablet 3   No facility-administered medications prior to visit.      Allergies:   Review of patient's allergies indicates no known allergies.   Social History   Social History  . Marital status: Single    Spouse name: N/A  . Number of children: N/A  . Years of education: N/A   Social History Main Topics  . Smoking status: Former Smoker    Quit date: 10/11/1971  . Smokeless tobacco: Never Used  . Alcohol use 0.0 oz/week     Comment: rare  . Drug use: No  . Sexual activity: Not Asked   Other Topics Concern  . None   Social History Narrative  . None     Family History:  [name] family history includes  Other in her mother; Pancreatic cancer in her father.   ROS:   Please see the history of present illness.    ROS All other systems reviewed and are negative.   PHYSICAL EXAM:   VS:  BP 120/62   Pulse 95   Ht 5\' 2"  (1.575 m)   Wt 210 lb 12.8 oz (95.6 kg)   LMP 10/10/2013   BMI 38.56 kg/m    GEN: Well nourished, well developed, in no acute distress  HEENT: normal  Neck: no JVD, carotid bruits, or masses Cardiac: RRR; no murmurs, rubs, or gallops,no edema.  Intact distal pulses bilaterally.  Respiratory:  clear to auscultation bilaterally, normal work of breathing GI: soft, nontender, nondistended, + BS MS: no deformity or atrophy  Skin: warm and dry, no rash Neuro:  Alert and Oriented x 3, Strength and sensation are intact Psych: euthymic mood, full affect  Wt Readings from Last 3 Encounters:  07/23/16 210 lb 12.8 oz (95.6 kg)  06/13/16 216 lb 3.2 oz (98.1 kg)  05/26/16 207 lb (93.9 kg)      Studies/Labs Reviewed:   EKG:  EKG is not ordered today.    Recent Labs: 02/11/2016: ALT 49 05/15/2016: B Natriuretic Peptide 205.5; TSH 6.805 05/19/2016: Magnesium 2.1 05/26/2016: Hemoglobin 8.5; Platelets 153 06/13/2016: BUN 21; Creat 1.55; Potassium 3.9; Sodium 144   Lipid Panel No results found for: CHOL, TRIG, HDL, CHOLHDL, VLDL, LDLCALC, LDLDIRECT  Additional studies/ records that were reviewed today include:  Hospital records    ASSESSMENT:    1. Chronic combined systolic and diastolic CHF, NYHA class 2 and ACA/AHA stage C ([place])   2. Chronic a-fib ([place])   3. Essential hypertension   4. Mitral valve disorder   5. Orthostatic hypotension   6. Orthostatic hypotension dysautonomic syndrome (HCC)      PLAN:  In order of problems listed above:  1. Chronic diastolic CHF NYHA class 2b.  She appears euvolemic on exam today.  Her weight has been stable around 206 lbs at home with clothes off.  She has no LE edema or JVD on exam today.  She does have some DOE which is stable  since discharge home.  I had a long discussion about compliance with daily weights and using the SS Demadex in addition of her daily demadex if weight increases by 3 lbs in a day and to call if weight dose not decrease.  She follows a strict low sodium diet. Check BMET today.  2. Chronic atrial fibrillation with tachy brady syndrome S/P BiV PPM.  Stop digoxin due to CKD and normalization of LVF 3. HTN - BP controlled.. 4. Severe MR s/p MV repair 5. Orthostatic hypotension stable on midodrine. 6. CKD stage 3 7. Moderate pulmonary HTN Group II secondary to diastolic CHF/MV disease    Medication Adjustments/Labs and Tests Ordered: Current medicines are reviewed at length with the patient today.  Concerns regarding medicines are outlined above.  Medication changes, Labs and Tests ordered today are listed in the Patient Instructions below.  Patient Instructions  Medication Instructions:  Your physician recommends that you continue on your current medications as directed. Please refer to the Current Medication list given to you today.   Labwork: TODAY: BMET  Testing/Procedures: None  Follow-Up: You have an appointment scheduled 10/15/16 at 9:30 AM with Melina Copa, PA.  Your physician wants you to follow-up in: 6 months with Dr. Radford Pax. You will receive a reminder letter in the mail two months in advance. If you don't receive a letter, please call our office to schedule the follow-up appointment.   Any Other Special Instructions Will Be Listed Below (If Applicable).     If you need a refill on your cardiac medications before your next appointment, please call your pharmacy.      Signed, [name], MD  07/23/2016 12:12 PM    [address], [place], [place]  [address] Phone: [phone]; Fax: [phone]

## 2016-07-23 NOTE — Patient Instructions (Signed)
Medication Instructions:  Your physician recommends that you continue on your current medications as directed. Please refer to the Current Medication list given to you today.   Labwork: TODAY: BMET  Testing/Procedures: None  Follow-Up: You have an appointment scheduled 10/15/16 at 9:30 AM with Melina Copa, PA.  Your physician wants you to follow-up in: 6 months with Dr. Radford Pax. You will receive a reminder letter in the mail two months in advance. If you don't receive a letter, please call our office to schedule the follow-up appointment.   Any Other Special Instructions Will Be Listed Below (If Applicable).     If you need a refill on your cardiac medications before your next appointment, please call your pharmacy.

## 2016-07-23 NOTE — Telephone Encounter (Signed)
Patient understands to STOP DIGOXIN. She was grateful for call.

## 2016-07-23 NOTE — Telephone Encounter (Signed)
Please let patient know that I want her to stop her digoxin given her normalization of LVF and diastolic CHF as well as CKD

## 2016-07-23 NOTE — Addendum Note (Signed)
Addended by: Harland German A on: 07/23/2016 12:51 PM   Modules accepted: Orders

## 2016-07-24 ENCOUNTER — Other Ambulatory Visit: Payer: Self-pay | Admitting: *Deleted

## 2016-07-24 DIAGNOSIS — E876 Hypokalemia: Secondary | ICD-10-CM

## 2016-07-30 ENCOUNTER — Other Ambulatory Visit: Payer: Medicare Other | Admitting: *Deleted

## 2016-07-30 DIAGNOSIS — I482 Chronic atrial fibrillation, unspecified: Secondary | ICD-10-CM

## 2016-07-30 DIAGNOSIS — E876 Hypokalemia: Secondary | ICD-10-CM

## 2016-07-30 LAB — BASIC METABOLIC PANEL
BUN: 17 mg/dL (ref 7–25)
CHLORIDE: 108 mmol/L (ref 98–110)
CO2: 24 mmol/L (ref 20–31)
Calcium: 8.6 mg/dL (ref 8.6–10.4)
Creat: 1.49 mg/dL — ABNORMAL HIGH (ref 0.60–0.88)
GLUCOSE: 137 mg/dL — AB (ref 65–99)
POTASSIUM: 4.1 mmol/L (ref 3.5–5.3)
Sodium: 144 mmol/L (ref 135–146)

## 2016-07-31 LAB — CBC
HCT: 27.1 % — ABNORMAL LOW (ref 35.0–45.0)
HEMOGLOBIN: 8.3 g/dL — AB (ref 11.7–15.5)
MCH: 26.4 pg — ABNORMAL LOW (ref 27.0–33.0)
MCHC: 30.6 g/dL — ABNORMAL LOW (ref 32.0–36.0)
MCV: 86.3 fL (ref 80.0–100.0)
MPV: 11.4 fL (ref 7.5–12.5)
PLATELETS: 130 10*3/uL — AB (ref 140–400)
RBC: 3.14 MIL/uL — AB (ref 3.80–5.10)
RDW: 18.3 % — ABNORMAL HIGH (ref 11.0–15.0)
WBC: 5.8 10*3/uL (ref 3.8–10.8)

## 2016-08-04 ENCOUNTER — Telehealth: Payer: Self-pay | Admitting: Cardiology

## 2016-08-04 NOTE — Telephone Encounter (Signed)
Mrs. Allbritton is calling to find out if she is to continue on with taking Potassium 40mg  or go back to taking 20mg  . Was supposed to get a call to let her know . Please call    Thanks

## 2016-08-04 NOTE — Telephone Encounter (Signed)
Pt's K+ was increased to 21mEq BID on 07/24/16 due to K+ level being 3.6.  Labs rechecked on 9/13 and K+ level was 4.1.  Will clarify with Dr. Radford Pax if she wants pt to continue Potassium 18mEq BID or switch to QD?

## 2016-08-05 NOTE — Telephone Encounter (Signed)
Called pt with recommendation from Dr. Radford Pax regarding potassium.  Review recommendation for pt to continue Potassium 20 mEq twice a day. Told pt to call back with any questions or concerns. Pt was pleasant and agreed with plan.

## 2016-08-05 NOTE — Telephone Encounter (Signed)
Continue on BID

## 2016-08-06 ENCOUNTER — Ambulatory Visit (INDEPENDENT_AMBULATORY_CARE_PROVIDER_SITE_OTHER): Payer: Medicare Other | Admitting: *Deleted

## 2016-08-06 ENCOUNTER — Other Ambulatory Visit: Payer: Self-pay | Admitting: *Deleted

## 2016-08-06 DIAGNOSIS — Z950 Presence of cardiac pacemaker: Secondary | ICD-10-CM

## 2016-08-06 NOTE — Progress Notes (Signed)
Pacemaker check in clinic. Normal device function. Thresholds, sensing, impedances consistent with previous measurements. Device programmed to maximize longevity. 65 monitored VT max 13 beats peak V 182bpm. Device programmed at appropriate safety margins. Histogram distribution appropriate for patient activity level. Device programmed to optimize intrinsic conduction. Estimated longevity 5.5 years. pt stated that she had a monitor at home pt to call back with monitor SN to enroll in [facility]. Remote scheduled for 11/05/2016. ROV with GT 12/2016. education completed.

## 2016-08-07 ENCOUNTER — Other Ambulatory Visit: Payer: Self-pay | Admitting: *Deleted

## 2016-08-07 MED ORDER — TORSEMIDE 20 MG PO TABS: 20.0000 mg | ORAL_TABLET | Freq: Two times a day (BID) | ORAL | 3 refills | Status: DC

## 2016-10-13 ENCOUNTER — Encounter: Payer: Self-pay | Admitting: Physician Assistant

## 2016-10-13 NOTE — Progress Notes (Signed)
Cardiology Office Note    Date:  10/15/2016  ID:  [name], DOB February 03, 1926, MRN [id number] PCP:  Kandice Hams, MD  Cardiologist:  Dr. Radford Pax   Chief Complaint: f/u CHF  History of Present Illness:  [name] is a 82 y.o. 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 She was last seen by Dr. Radford Pax 07/2016 and was doing well at 206lb. Digoxin was stopped due to CKD and normalization of LVEF. Labs 07/30/16: K 4.1 (stable following increase in potassium), Cr 1.49, glucose 137, Hgb 8.3 (c/w prior). 2D Echo 04/2016: EF 50-55%, mild AI, mild-mod MS, mild LAE/RVE, mod TR.  The patient returns today for follow-up with her daughter feeling great. In September her potassium was doubled to BID and since that time she's felt so much better. She has more energy, her color is better, and she has managed to lose several pounds. It is important to note that digoxin was also stopped at that time. Her edema has also improved per her report.  Past Medical History:  Diagnosis Date  . Anemia   . Brady-tachy syndrome ([place])    a. 2003: post-op afib after MR repair then symptomatic bradycardia requiring pacemaker. b. Upgrade to Medtronic Bi-V Pacemaker 2007.  . Cellulitis and abscess of foot 08/15/2015   RT FOOT  . Chronic atrial fibrillation ([place])    a. Previously failed DCCV/amio/tikosyn.  CHADS2VASC score 5 - not on anticoagulation due to history of GI bleeding.    . Chronic combined systolic and diastolic CHF (congestive heart failure) (HCC)    a. EF 35-40% on echo 09/2013 b. echo  04/2016: EF 50-55% with mild to moderate MS.  . Cirrhosis ([place])    a. Newly recognized 09/2013 - by CT.  . CKD (chronic kidney disease)   . GI bleeding   . Hypertension   . Mitral valve disorder    a. Severe MR s/p repair 2003 (28 mm annuloplasty ring and and oversew of LAA). No CAD by cath at that time.  [name] NSVT (nonsustained ventricular tachycardia) ([place])    a. Isolated event during 09/2007 adm.  . Orthostatic hypotension dysautonomic syndrome ([place])   . Pulmonary hypertension    Group II secondary to CHF and MV disease  . Spinal stenosis    shes recieved epidural steroid injections in the past  . Tricuspid regurgitation     Past Surgical History:  Procedure Laterality Date  . APPENDECTOMY    . BIV PACEMAKER GENERATOR CHANGE OUT N/A 12/13/2014   Procedure: BIV PACEMAKER GENERATOR CHANGE OUT;  Surgeon: Evans Lance, MD;  Location: [facility] CATH LAB;  Service: Cardiovascular;  Laterality: N/A;  . CHOLECYSTECTOMY    . IRRIGATION AND DEBRIDEMENT ABSCESS N/A 10/14/2013   Procedure: IRRIGATION AND DEBRIDEMENT PERINEAL ABSCESS;  Surgeon: Rolm Bookbinder, MD;  Location: [facility];  Service: General;  Laterality: N/A;  . VALVE REPLACEMENT     sever mitral regurgitation s/p mitral valve annuloplasty ring    Current Medications: Current Outpatient Prescriptions  Medication Sig Dispense Refill  . acetaminophen (TYLENOL) 500 MG tablet Take 1,000 mg by mouth every 6 (six) hours as needed (  pain).    . beta carotene w/minerals (OCUVITE) tablet Take 1 tablet by mouth daily after supper.     . cetirizine (ZYRTEC) 10 MG tablet Take 10 mg by mouth daily.     . colchicine 0.6 MG tablet Take 0.6 mg by mouth daily.    . ferrous sulfate 325 (65 FE) MG tablet Take 1 tablet (325 mg total) by mouth 2 (two) times daily with a meal. 60 tablet 6  . midodrine (PROAMATINE) 2.5 MG tablet Take 2.5 mg by mouth 3 (three) times daily with meals.    . Multiple Vitamin (MULTIVITAMIN WITH MINERALS) TABS tablet Take 1 tablet  by mouth daily.    . pantoprazole (PROTONIX) 40 MG tablet Take 40 mg by mouth daily.    . potassium chloride SA (K-DUR,KLOR-CON) 20 MEQ tablet Take 20 mEq by mouth 2 (two) times daily. 90 tablet 3  . torsemide (DEMADEX) 20 MG tablet Take 1 tablet (20 mg total) by mouth 2 (two) times daily. Take extra tablet if weight gain greater than 3 lbs in one day 190 tablet 3   No current facility-administered medications for this visit.      Allergies:   Patient has no known allergies.   Social History   Social History  . Marital status: Single    Spouse name: N/A  . Number of children: N/A  . Years of education: N/A   Social History Main Topics  . Smoking status: Former Smoker    Quit date: 10/11/1971  . Smokeless tobacco: Never Used  . Alcohol use 0.0 oz/week     Comment: rare  . Drug use: No  . Sexual activity: Not Asked   Other Topics Concern  . None   Social History Narrative  . None     Family History:  [name] family history includes Other in her mother; Pancreatic cancer in her father.   ROS:   Please see the history of present illness. All other systems are reviewed and otherwise negative.    PHYSICAL EXAM:   VS:  BP 128/66   Pulse 75   Ht 5\' 2"  (1.575 m)   Wt 197 lb 8 oz (89.6 kg)   LMP 10/10/2013   SpO2 99%   BMI 36.12 kg/m   BMI: Body mass index is 36.12 kg/m. GEN: Well nourished, well developed WF, in no acute distress. Appears younger than stated age. HEENT: normocephalic, atraumatic Neck: no JVD, carotid bruits, or masses Cardiac: RRR; no murmurs, rubs, or gallops, no edema  Respiratory:  clear to auscultation bilaterally, normal work of breathing GI: soft, nontender, nondistended, + BS MS: no deformity or atrophy  Skin: warm and dry, no rash Neuro:  Alert and Oriented x 3, Strength and sensation are intact, follows commands Psych: euthymic mood, full affect  Wt Readings from Last 3 Encounters:  10/15/16 197 lb 8 oz (89.6 kg)  07/23/16 210 lb  12.8 oz (95.6 kg)  06/13/16 216 lb 3.2 oz (98.1 kg)      Studies/Labs Reviewed:   EKG:  EKG was ordered today and personally reviewed by me and demonstrates ventricular pacing 75bpm  Recent Labs: 02/11/2016: ALT 49 05/15/2016: B Natriuretic Peptide 205.5; TSH 6.805 05/19/2016: Magnesium 2.1 07/30/2016: BUN 17; Creat 1.49; Hemoglobin 8.3; Platelets 130; Potassium 4.1; Sodium 144   Lipid Panel No results found for: CHOL, TRIG, HDL, CHOLHDL, VLDL, LDLCALC, LDLDIRECT  Additional studies/ records that were reviewed today include: Summarized above.    ASSESSMENT & PLAN:   1.  Chronic combined CHF - appears euvolemic. Weight is actually down a few lbs. She is feeling great. Reviewed importance of HF maintenance including daily weights, low sodium diet, fluid restriction to 2L per day. Continue current regimen. Pt requests refill of potassium into her mail order. Will send in. 2. CKD stage III - stable by recheck in 07/2016.  3. Chronic atrial fib with tachy/brady syndrome - not on anticoagulation due to h/o GIB and anemia. Rate controlled today. Will make sure she has EP f/u as recommended in Feb 2018. 4. Chronic anemia - her daughter says this is something our office has been following. I advised that this should probably be followed by PCP as well given her CKD, as sometimes patients go on to require medications to help stimulate production. 5. S/p MV repair - clinically stable. 6. Orthostatic hypotension - stable. No symptoms of recurrence. Reviewed orthostatic precautions with patient. She reports she is quite careful.  Disposition: F/u with EP Dr. Lovena Le in 12/2016 (due for recall), and Dr. Radford Pax in March/April 2018.   Medication Adjustments/Labs and Tests Ordered: Current medicines are reviewed at length with the patient today.  Concerns regarding medicines are outlined above. Medication changes, Labs and Tests ordered today are summarized above and listed in the Patient Instructions  accessible in Encounters.   Raechel Ache PA-C  10/15/2016 9:53 AM    [address], [place], [place]  [address] Phone: [phone]; Fax: [phone]

## 2016-10-15 ENCOUNTER — Encounter: Payer: Self-pay | Admitting: Physician Assistant

## 2016-10-15 ENCOUNTER — Ambulatory Visit (INDEPENDENT_AMBULATORY_CARE_PROVIDER_SITE_OTHER): Payer: Medicare Other | Admitting: Physician Assistant

## 2016-10-15 VITALS — BP 128/66 | HR 75 | Ht 62.0 in | Wt 197.5 lb

## 2016-10-15 DIAGNOSIS — D649 Anemia, unspecified: Secondary | ICD-10-CM | POA: Diagnosis not present

## 2016-10-15 DIAGNOSIS — N183 Chronic kidney disease, stage 3 unspecified: Secondary | ICD-10-CM

## 2016-10-15 DIAGNOSIS — Z9889 Other specified postprocedural states: Secondary | ICD-10-CM | POA: Diagnosis not present

## 2016-10-15 DIAGNOSIS — I5042 Chronic combined systolic (congestive) and diastolic (congestive) heart failure: Secondary | ICD-10-CM | POA: Diagnosis not present

## 2016-10-15 DIAGNOSIS — I482 Chronic atrial fibrillation, unspecified: Secondary | ICD-10-CM

## 2016-10-15 DIAGNOSIS — I951 Orthostatic hypotension: Secondary | ICD-10-CM

## 2016-10-15 MED ORDER — POTASSIUM CHLORIDE CRYS ER 20 MEQ PO TBCR: 20.0000 meq | EXTENDED_RELEASE_TABLET | Freq: Two times a day (BID) | ORAL | 3 refills | Status: DC

## 2016-10-15 NOTE — Patient Instructions (Signed)
Medication Instructions:  Your physician recommends that you continue on your current medications as directed. Please refer to the Current Medication list given to you today.   Labwork: None ordered  Testing/Procedures: None ordered  Follow-Up: Your physician recommends that you schedule a follow-up appointment in: [place] Your physician wants you to follow-up in: 4 - [place].  You will receive a reminder letter in the mail two months in advance. If you don't receive a letter, please call our office to schedule the follow-up appointment.     Any Other Special Instructions Will Be Listed Below (If Applicable).     If you need a refill on your cardiac medications before your next appointment, please call your pharmacy.

## 2016-10-16 ENCOUNTER — Ambulatory Visit: Payer: Medicare Other | Admitting: Cardiology

## 2016-11-05 ENCOUNTER — Encounter: Payer: Medicare Other | Admitting: *Deleted

## 2016-11-05 ENCOUNTER — Telehealth: Payer: Self-pay | Admitting: Cardiology

## 2016-11-05 NOTE — Telephone Encounter (Signed)
Spoke with pt and reminded pt of remote transmission that is due today. Pt verbalized understanding.   

## 2016-11-07 ENCOUNTER — Encounter: Payer: Self-pay | Admitting: Cardiology

## 2016-11-19 ENCOUNTER — Ambulatory Visit (INDEPENDENT_AMBULATORY_CARE_PROVIDER_SITE_OTHER): Payer: Medicare Other | Admitting: *Deleted

## 2016-11-19 ENCOUNTER — Telehealth: Payer: Self-pay | Admitting: *Deleted

## 2016-11-19 DIAGNOSIS — Z950 Presence of cardiac pacemaker: Secondary | ICD-10-CM | POA: Diagnosis not present

## 2016-11-19 DIAGNOSIS — I482 Chronic atrial fibrillation, unspecified: Secondary | ICD-10-CM

## 2016-11-19 NOTE — Telephone Encounter (Signed)
New message ° ° ° ° ° °1. Has your device fired? no °2. Is you device beeping? no ° °3. Are you experiencing draining or swelling at device site? no ° °4. Are you calling to see if we received your device transmission?yes °5. Have you passed out? no °

## 2016-11-19 NOTE — Telephone Encounter (Signed)
Spoke with patient's daughter, Santiago Glad ([place]).  Updated monitor SN in [facility].  Previous transmissions successfully received, had patient send another transmission.  Will add to the remote transmission schedule for today.  Santiago Glad is appreciative and denies additional questions or concerns at this time.  She is aware of patient's appointment on 02/04/17 with Dr. Lovena Le.

## 2016-11-20 NOTE — Progress Notes (Signed)
Remote pacemaker transmission.   

## 2016-11-21 ENCOUNTER — Encounter: Payer: Self-pay | Admitting: Cardiology

## 2016-11-21 ENCOUNTER — Other Ambulatory Visit: Payer: Self-pay | Admitting: Cardiology

## 2016-11-27 LAB — CUP PACEART REMOTE DEVICE CHECK
Battery Remaining Longevity: 66 mo
Brady Statistic AP VS Percent: 0 %
Brady Statistic AS VP Percent: 0 %
Brady Statistic AS VS Percent: 0 %
Date Time Interrogation Session: 20180104023454
Implantable Lead Implant Date: 20030908
Implantable Lead Implant Date: 20030908
Implantable Pulse Generator Implant Date: 20160127
Lead Channel Impedance Value: 304 Ohm
Lead Channel Impedance Value: 361 Ohm
Lead Channel Impedance Value: 418 Ohm
Lead Channel Impedance Value: 475 Ohm
Lead Channel Impedance Value: 475 Ohm
Lead Channel Impedance Value: 513 Ohm
Lead Channel Impedance Value: 551 Ohm
Lead Channel Pacing Threshold Amplitude: 0.625 V
Lead Channel Sensing Intrinsic Amplitude: 0.25 mV
Lead Channel Sensing Intrinsic Amplitude: 30 mV
Lead Channel Setting Pacing Amplitude: 1.75 V
Lead Channel Setting Pacing Amplitude: 2.5 V
Lead Channel Setting Pacing Pulse Width: 0.4 ms
Lead Channel Setting Sensing Sensitivity: 5.6 mV
MDC IDC LEAD IMPLANT DT: 20071114
MDC IDC LEAD LOCATION: 753858
MDC IDC LEAD LOCATION: 753859
MDC IDC LEAD LOCATION: 753860
MDC IDC MSMT BATTERY VOLTAGE: 3.02 V
MDC IDC MSMT LEADCHNL LV IMPEDANCE VALUE: 608 Ohm
MDC IDC MSMT LEADCHNL RA IMPEDANCE VALUE: 323 Ohm
MDC IDC MSMT LEADCHNL RA SENSING INTR AMPL: 0.375 mV
MDC IDC MSMT LEADCHNL RV PACING THRESHOLD PULSEWIDTH: 0.4 ms
MDC IDC MSMT LEADCHNL RV SENSING INTR AMPL: 30 mV
MDC IDC SET LEADCHNL LV PACING PULSEWIDTH: 0.6 ms
MDC IDC STAT BRADY AP VP PERCENT: 0 %
MDC IDC STAT BRADY RA PERCENT PACED: 0 %
MDC IDC STAT BRADY RV PERCENT PACED: 98.78 %

## 2016-12-03 ENCOUNTER — Other Ambulatory Visit: Payer: Self-pay | Admitting: Cardiology

## 2016-12-10 ENCOUNTER — Encounter: Payer: Self-pay | Admitting: Cardiology

## 2017-02-04 ENCOUNTER — Ambulatory Visit: Payer: Medicare Other | Admitting: Cardiology

## 2017-02-04 ENCOUNTER — Encounter: Payer: Medicare Other | Admitting: Internal Medicine

## 2017-02-10 ENCOUNTER — Other Ambulatory Visit: Payer: Self-pay | Admitting: Internal Medicine

## 2017-03-25 ENCOUNTER — Ambulatory Visit: Payer: Medicare Other | Admitting: Cardiology

## 2017-03-27 ENCOUNTER — Ambulatory Visit (INDEPENDENT_AMBULATORY_CARE_PROVIDER_SITE_OTHER): Payer: Medicare Other | Admitting: Cardiology

## 2017-03-27 ENCOUNTER — Encounter: Payer: Self-pay | Admitting: Cardiology

## 2017-03-27 VITALS — BP 122/76 | HR 93 | Resp 16 | Ht 62.0 in | Wt 207.0 lb

## 2017-03-27 DIAGNOSIS — I5042 Chronic combined systolic (congestive) and diastolic (congestive) heart failure: Secondary | ICD-10-CM | POA: Diagnosis not present

## 2017-03-27 DIAGNOSIS — I951 Orthostatic hypotension: Secondary | ICD-10-CM | POA: Diagnosis not present

## 2017-03-27 DIAGNOSIS — I4821 Permanent atrial fibrillation: Secondary | ICD-10-CM

## 2017-03-27 DIAGNOSIS — I482 Chronic atrial fibrillation: Secondary | ICD-10-CM | POA: Diagnosis not present

## 2017-03-27 DIAGNOSIS — N183 Chronic kidney disease, stage 3 unspecified: Secondary | ICD-10-CM

## 2017-03-27 DIAGNOSIS — I059 Rheumatic mitral valve disease, unspecified: Secondary | ICD-10-CM | POA: Diagnosis not present

## 2017-03-27 LAB — BASIC METABOLIC PANEL
BUN / CREAT RATIO: 16 (ref 12–28)
BUN: 25 mg/dL (ref 8–27)
CO2: 26 mmol/L (ref 18–29)
CREATININE: 1.6 mg/dL — AB (ref 0.57–1.00)
Calcium: 8.9 mg/dL (ref 8.7–10.3)
Chloride: 104 mmol/L (ref 96–106)
GFR calc Af Amer: 33 mL/min/{1.73_m2} — ABNORMAL LOW (ref >59)
GFR calc non Af Amer: 29 mL/min/{1.73_m2} — ABNORMAL LOW (ref >59)
GLUCOSE: 88 mg/dL (ref 65–99)
Potassium: 4.7 mmol/L (ref 3.5–5.2)
SODIUM: 146 mmol/L — AB (ref 134–144)

## 2017-03-27 NOTE — Progress Notes (Signed)
Cardiology Office Note    Date:  03/27/2017   ID:  [name], DOB 04/09/1926, MRN [id number]  PCP:  [name], MD  Cardiologist:  [name], MD   Chief Complaint  Patient presents with  . Congestive Heart Failure    History of Present Illness:  [name] is a 83 y.o. female with past medical history of chronic combined systolic/diastolic CHF (EF 62-70% by echo 04/2016), tachy brady syndrome (s/p BiV PPM insertion), severe MR s/p MV repair in 2003 (mild to moderate MS on echo 04/2016), Stage 3 CKD, chronic atrial fibrillation (not on anticoagulation secondary to GIB), hypotension (on midodrine),and moderate pulmonary HTN. Most recent echocardiogram, 04/2016, showed an improved EF of 50-55% with mild to moderate mitral stenosis. Due to hypotension she is unable to tolerate BB or ACE- I. She is on Midodrine.  She is now here for followup and is doing well.  Her weight at home as been very stable around 206lbs and is 207lbs today.  The last time she was 197lbs but she has eaten out some recently so has gained weight some weight back but she says it is not fluid.  She  has chronic DOE which appears stable and usually only occurs when she is having back pain and she holds her breath.  She is able to get around and do her daily activities without too much limitation.  She has no LE edema.  She denies any chest pain, palpitations, PND, orthopnea, dizziness or syncope.      Past Medical History:  Diagnosis Date  . Anemia   . Brady-tachy syndrome ([place])    a. 2003: post-op afib after MR repair then symptomatic bradycardia requiring pacemaker. b. Upgrade to Medtronic Bi-V Pacemaker 2007.  . Cellulitis and abscess of foot 08/15/2015   RT FOOT  . Chronic atrial fibrillation ([place])    a. Previously failed DCCV/amio/tikosyn.  CHADS2VASC score 5 - not on anticoagulation due to history of GI bleeding.    . Chronic combined systolic and diastolic CHF (congestive heart failure) (HCC)      a. EF 35-40% on echo 09/2013 b. echo 04/2016: EF 50-55% with mild to moderate MS.  . Cirrhosis ([place])    a. Newly recognized 09/2013 - by CT.  . CKD (chronic kidney disease)   . GI bleeding   . Hypertension   . Mitral valve disorder    a. Severe MR s/p repair 2003 (28 mm annuloplasty ring and and oversew of LAA). No CAD by cath at that time.  [name] NSVT (nonsustained ventricular tachycardia) ([place])    a. Isolated event during 09/2007 adm.  . Orthostatic hypotension dysautonomic syndrome ([place])   . Pulmonary hypertension (HCC)    Group II secondary to CHF and MV disease  . Spinal stenosis    shes recieved epidural steroid injections in the past  . Tricuspid regurgitation     Past Surgical History:  Procedure Laterality Date  . APPENDECTOMY    . BIV PACEMAKER GENERATOR CHANGE OUT N/A 12/13/2014   Procedure: BIV PACEMAKER GENERATOR CHANGE OUT;  Surgeon: Evans Lance, MD;  Location: [facility] CATH LAB;  Service: Cardiovascular;  Laterality: N/A;  . CHOLECYSTECTOMY    . IRRIGATION AND DEBRIDEMENT ABSCESS N/A 10/14/2013   Procedure: IRRIGATION AND DEBRIDEMENT PERINEAL ABSCESS;  Surgeon: Rolm Bookbinder, MD;  Location: [facility];  Service: General;  Laterality: N/A;  . VALVE REPLACEMENT     sever mitral regurgitation s/p mitral valve annuloplasty ring    Current  Medications: Current Meds  Medication Sig  . acetaminophen (TYLENOL) 500 MG tablet Take 1,000 mg by mouth every 6 (six) hours as needed (pain).  [name] beta carotene w/minerals (OCUVITE) tablet Take 1 tablet by mouth daily after supper.   . cetirizine (ZYRTEC) 10 MG tablet Take 10 mg by mouth daily.   . colchicine 0.6 MG tablet Take 0.6 mg by mouth daily.  . ferrous sulfate 325 (65 FE) MG tablet Take 1 tablet (325 mg total) by mouth 2 (two) times daily with a meal.  . Homeopathic Products (4 THRIVE CLEANSING IN) Take 40 mg by mouth daily.  . midodrine (PROAMATINE) 2.5 MG tablet Take 1 tablet (2.5 mg total) by mouth 3 (three) times daily with  meals.  . Multiple Vitamin (MULTIVITAMIN WITH MINERALS) TABS tablet Take 1 tablet by mouth daily.  . pantoprazole (PROTONIX) 40 MG tablet Take 40 mg by mouth daily.  . potassium chloride SA (K-DUR,KLOR-CON) 20 MEQ tablet Take 1 tablet (20 mEq total) by mouth 2 (two) times daily.  [name] torsemide (DEMADEX) 20 MG tablet TAKE 1 TABLET BY MOUTH 2  TIMES DAILY. TAKE EXTRA  TABLET IF WEIGHT GAIN  GREATER THAN 3 LBS IN ONE  DAY    Allergies:   Patient has no known allergies.   Social History   Social History  . Marital status: Single    Spouse name: N/A  . Number of children: N/A  . Years of education: N/A   Social History Main Topics  . Smoking status: Former Smoker    Quit date: 10/11/1971  . Smokeless tobacco: Never Used  . Alcohol use 0.0 oz/week     Comment: rare  . Drug use: No  . Sexual activity: Not Asked   Other Topics Concern  . None   Social History Narrative  . None     Family History:  [name] family history includes Other in her mother; Pancreatic cancer in her father.   ROS:   Please see the history of present illness.    ROS All other systems reviewed and are negative.  No flowsheet data found.     PHYSICAL EXAM:   VS:  BP 122/76   Pulse 93   Resp 16   Ht 5\' 2"  (1.575 m)   Wt 207 lb (93.9 kg)   LMP 10/10/2013   SpO2 98%   BMI 37.86 kg/m    GEN: Well nourished, well developed, in no acute distress  HEENT: normal  Neck: no JVD, carotid bruits, or masses Cardiac: RRR; no murmurs, rubs, or gallops,no edema.  Intact distal pulses bilaterally.  Respiratory:  clear to auscultation bilaterally, normal work of breathing GI: soft, nontender, nondistended, + BS MS: no deformity or atrophy  Skin: warm and dry, no rash Neuro:  Alert and Oriented x 3, Strength and sensation are intact Psych: euthymic mood, full affect  Wt Readings from Last 3 Encounters:  03/27/17 207 lb (93.9 kg)  10/15/16 197 lb 8 oz (89.6 kg)  07/23/16 210 lb 12.8 oz (95.6 kg)       Studies/Labs Reviewed:   EKG:  EKG is not ordered today.  Recent Labs: 05/15/2016: B Natriuretic Peptide 205.5; TSH 6.805 05/19/2016: Magnesium 2.1 07/30/2016: BUN 17; Creat 1.49; Hemoglobin 8.3; Platelets 130; Potassium 4.1; Sodium 144   Lipid Panel No results found for: CHOL, TRIG, HDL, CHOLHDL, VLDL, LDLCALC, LDLDIRECT  Additional studies/ records that were reviewed today include:  none    ASSESSMENT:    1. Chronic combined systolic  and diastolic CHF, NYHA class 2 and ACA/AHA stage C ([place])   2. CKD (chronic kidney disease) stage 3, GFR 30-59 ml/min   3. Permanent atrial fibrillation ([place])   4. Mitral valve disorder   5. Orthostatic hypotension      PLAN:  In order of problems listed above:  1. Chronic combined CHF - she appears euvolemic on exam today. Weight is stable at 207lbs which is what her weight usually is at home. Reviewed importance of HF maintenance including daily weights, low sodium diet, fluid restriction to 2L per day. Continue current regimen of torsemide 20mg  BID. She cannot tolerate BB or hydralazine due to orthostatic hypotension and no ACE I due to CKD.  2. CKD stage III - stable by recheck 07/2016 at 1.49.  I will repeat a BMET today.   3. Permanent atrial fib with tachy/brady syndrome - not on anticoagulation due to h/o GIB and anemia. Rate controlled today. Will make sure she has EP f/u as recommended in Feb 2018.  4. S/p MV repair - clinically stable.  5. Orthostatic hypotension - stable. No symptoms of recurrence.  She will continue on midodrine.     Medication Adjustments/Labs and Tests Ordered: Current medicines are reviewed at length with the patient today.  Concerns regarding medicines are outlined above.  Medication changes, Labs and Tests ordered today are listed in the Patient Instructions below.  There are no Patient Instructions on file for this visit.   Signed, [name], MD  03/27/2017 9:31 AM    [facility]  [address], [place], [place]  [address] Phone: [phone]; Fax: [phone]

## 2017-03-27 NOTE — Patient Instructions (Signed)

## 2017-04-27 ENCOUNTER — Ambulatory Visit (INDEPENDENT_AMBULATORY_CARE_PROVIDER_SITE_OTHER): Payer: Medicare Other | Admitting: Internal Medicine

## 2017-04-27 ENCOUNTER — Encounter: Payer: Self-pay | Admitting: Internal Medicine

## 2017-04-27 VITALS — BP 98/50 | HR 48 | Ht 62.0 in | Wt 203.0 lb

## 2017-04-27 DIAGNOSIS — I482 Chronic atrial fibrillation, unspecified: Secondary | ICD-10-CM

## 2017-04-27 DIAGNOSIS — Z950 Presence of cardiac pacemaker: Secondary | ICD-10-CM | POA: Diagnosis not present

## 2017-04-27 DIAGNOSIS — I5042 Chronic combined systolic (congestive) and diastolic (congestive) heart failure: Secondary | ICD-10-CM | POA: Diagnosis not present

## 2017-04-27 DIAGNOSIS — I472 Ventricular tachycardia: Secondary | ICD-10-CM | POA: Diagnosis not present

## 2017-04-27 DIAGNOSIS — I4729 Other ventricular tachycardia: Secondary | ICD-10-CM

## 2017-04-27 LAB — CUP PACEART INCLINIC DEVICE CHECK
Battery Remaining Longevity: 65 mo
Battery Voltage: 3.01 V
Brady Statistic AP VP Percent: 0 %
Brady Statistic RA Percent Paced: 0 %
Brady Statistic RV Percent Paced: 98.8 %
Date Time Interrogation Session: 20180611131147
Implantable Lead Implant Date: 20030908
Implantable Lead Implant Date: 20030908
Implantable Lead Implant Date: 20071114
Implantable Lead Location: 753860
Implantable Lead Model: 4194
Implantable Lead Model: 5076
Lead Channel Impedance Value: 304 Ohm
Lead Channel Impedance Value: 323 Ohm
Lead Channel Impedance Value: 418 Ohm
Lead Channel Impedance Value: 570 Ohm
Lead Channel Impedance Value: 627 Ohm
Lead Channel Pacing Threshold Amplitude: 0.5 V
Lead Channel Pacing Threshold Pulse Width: 0.4 ms
Lead Channel Sensing Intrinsic Amplitude: 0.25 mV
Lead Channel Setting Pacing Amplitude: 1.75 V
Lead Channel Setting Pacing Pulse Width: 0.4 ms
MDC IDC LEAD LOCATION: 753858
MDC IDC LEAD LOCATION: 753859
MDC IDC MSMT LEADCHNL LV IMPEDANCE VALUE: 475 Ohm
MDC IDC MSMT LEADCHNL LV IMPEDANCE VALUE: 513 Ohm
MDC IDC MSMT LEADCHNL LV PACING THRESHOLD AMPLITUDE: 1 V
MDC IDC MSMT LEADCHNL LV PACING THRESHOLD PULSEWIDTH: 0.6 ms
MDC IDC MSMT LEADCHNL RA SENSING INTR AMPL: 0.375 mV
MDC IDC MSMT LEADCHNL RV IMPEDANCE VALUE: 513 Ohm
MDC IDC MSMT LEADCHNL RV IMPEDANCE VALUE: 532 Ohm
MDC IDC MSMT LEADCHNL RV SENSING INTR AMPL: 8.25 mV
MDC IDC MSMT LEADCHNL RV SENSING INTR AMPL: 8.25 mV
MDC IDC PG IMPLANT DT: 20160127
MDC IDC SET LEADCHNL LV PACING PULSEWIDTH: 0.6 ms
MDC IDC SET LEADCHNL RV PACING AMPLITUDE: 2.5 V
MDC IDC SET LEADCHNL RV SENSING SENSITIVITY: 5.6 mV
MDC IDC STAT BRADY AP VS PERCENT: 0 %
MDC IDC STAT BRADY AS VP PERCENT: 0 %
MDC IDC STAT BRADY AS VS PERCENT: 0 %

## 2017-04-27 NOTE — Patient Instructions (Signed)
Medication Instructions:  Your physician recommends that you continue on your current medications as directed. Please refer to the Current Medication list given to you today.   Labwork: None Ordered   Testing/Procedures: None Ordered   Follow-Up: Your physician wants you to follow-up in: 1 year with Dr. Lovena Le. You will receive a reminder letter in the mail two months in advance. If you don't receive a letter, please call our office to schedule the follow-up appointment.  Remote monitoring is used to monitor your Pacemaker from home. This monitoring reduces the number of office visits required to check your device to one time per year. It allows [place] to keep an eye on the functioning of your device to ensure it is working properly. You are scheduled for a device check from home on  07/27/17 . You may send your transmission at any time that day. If you have a wireless device, the transmission will be sent automatically. After your physician reviews your transmission, you will receive a postcard with your next transmission date.    Any Other Special Instructions Will Be Listed Below (If Applicable).     If you need a refill on your cardiac medications before your next appointment, please call your pharmacy.

## 2017-04-27 NOTE — Progress Notes (Signed)
HPI  [name] returns today for followup. She is a pleasant 83 yo woman with a h/o CHB, chronic atrial fibrillation and orthostasis.  She denies chest pain, sob, or syncope. In the interim she has been stable with no syncope. She has done well over the past few months. No sob.  No other complaints. No fever or chills. No Known Allergies   Current Outpatient Prescriptions  Medication Sig Dispense Refill  . acetaminophen (TYLENOL) 500 MG tablet Take 1,000 mg by mouth every 6 (six) hours as needed (pain).    [name] beta carotene w/minerals (OCUVITE) tablet Take 1 tablet by mouth daily after supper.     . cetirizine (ZYRTEC) 10 MG tablet Take 10 mg by mouth daily.     . colchicine 0.6 MG tablet Take 0.6 mg by mouth daily.    . ferrous sulfate 325 (65 FE) MG tablet Take 1 tablet (325 mg total) by mouth 2 (two) times daily with a meal. 60 tablet 6  . Homeopathic Products (4 THRIVE CLEANSING IN) Take 40 mg by mouth daily.    . midodrine (PROAMATINE) 2.5 MG tablet Take 1 tablet (2.5 mg total) by mouth 3 (three) times daily with meals. 270 tablet 3  . Multiple Vitamin (MULTIVITAMIN WITH MINERALS) TABS tablet Take 1 tablet by mouth daily.    . pantoprazole (PROTONIX) 40 MG tablet Take 40 mg by mouth daily.    . potassium chloride SA (K-DUR,KLOR-CON) 20 MEQ tablet Take 1 tablet (20 mEq total) by mouth 2 (two) times daily. 180 tablet 3  . torsemide (DEMADEX) 20 MG tablet TAKE 1 TABLET BY MOUTH 2  TIMES DAILY. TAKE EXTRA  TABLET IF WEIGHT GAIN  GREATER THAN 3 LBS IN ONE  DAY 270 tablet 1   No current facility-administered medications for this visit.      Past Medical History:  Diagnosis Date  . Anemia   . Brady-tachy syndrome ([place])    a. 2003: post-op afib after MR repair then symptomatic bradycardia requiring pacemaker. b. Upgrade to Medtronic Bi-V Pacemaker 2007.  . Cellulitis and abscess of foot 08/15/2015   RT FOOT  . Chronic atrial fibrillation ([place])    a. Previously failed  DCCV/amio/tikosyn.  CHADS2VASC score 5 - not on anticoagulation due to history of GI bleeding.    . Chronic combined systolic and diastolic CHF (congestive heart failure) (HCC)    a. EF 35-40% on echo 09/2013 b. echo 04/2016: EF 50-55% with mild to moderate MS.  . Cirrhosis ([place])    a. Newly recognized 09/2013 - by CT.  . CKD (chronic kidney disease)   . GI bleeding   . Hypertension   . Mitral valve disorder    a. Severe MR s/p repair 2003 (28 mm annuloplasty ring and and oversew of LAA). No CAD by cath at that time.  [name] NSVT (nonsustained ventricular tachycardia) ([place])    a. Isolated event during 09/2007 adm.  . Orthostatic hypotension dysautonomic syndrome ([place])   . Pulmonary hypertension (HCC)    Group II secondary to CHF and MV disease  . Spinal stenosis    shes recieved epidural steroid injections in the past  . Tricuspid regurgitation     ROS:   All systems reviewed and negative except as noted in the HPI.   Past Surgical History:  Procedure Laterality Date  . APPENDECTOMY    . BIV PACEMAKER GENERATOR CHANGE OUT N/A 12/13/2014   Procedure: BIV PACEMAKER GENERATOR CHANGE OUT;  Surgeon: Carleene Overlie  Peyton Najjar, MD;  Location: [facility] CATH LAB;  Service: Cardiovascular;  Laterality: N/A;  . CHOLECYSTECTOMY    . IRRIGATION AND DEBRIDEMENT ABSCESS N/A 10/14/2013   Procedure: IRRIGATION AND DEBRIDEMENT PERINEAL ABSCESS;  Surgeon: Rolm Bookbinder, MD;  Location: [facility];  Service: General;  Laterality: N/A;  . VALVE REPLACEMENT     sever mitral regurgitation s/p mitral valve annuloplasty ring     Family History  Problem Relation Age of Onset  . Pancreatic cancer Father   . Other Mother        Mother died at 41 with no real medical problems     Social History   Social History  . Marital status: Single    Spouse name: N/A  . Number of children: N/A  . Years of education: N/A   Occupational History  . Not on file.   Social History Main Topics  . Smoking status: Former Smoker     Quit date: 10/11/1971  . Smokeless tobacco: Never Used  . Alcohol use 0.0 oz/week     Comment: rare  . Drug use: No  . Sexual activity: Not on file   Other Topics Concern  . Not on file   Social History Narrative  . No narrative on file     BP (!) 98/50   Pulse (!) 48   Ht 5\' 2"  (1.575 m)   Wt 203 lb (92.1 kg)   LMP 10/10/2013   SpO2 96%   BMI 37.13 kg/m   Physical Exam:  Well appearing obese, elderly woman, NAD HEENT: Unremarkable Neck:  7 cm JVD, no thyromegally Back:  No CVA tenderness Lungs:  Clear with no wheezes HEART:  Regular rate rhythm, no murmurs, no rubs, no clicks Abd:  soft, positive bowel sounds, no organomegally, no rebound, no guarding Ext:  2 plus pulses, trace peripheral edema, no cyanosis, no clubbing Skin:  No rashes no nodules Neuro:  CN II through XII intact, motor grossly intact  ECG - atrial fib with ventricular pacing  DEVICE  Normal device function.  See PaceArt for details.  Assess/Plan: 1. PPM - her BiV PPM is working normally. Will recheck in several months. 2. Atrial fibrillation - she is now in atrial fib essentially 100% of the time. She is not a candidate for anti-coag with a h/o GI bleeding 3. HTN - her blood pressure has been well controlled. Will follow. 4. Chronic systolic heart failure - her symptoms are well controlled. She is encouraged to maintain a low sodium diet and her twice daily torsemide.  Mikle Bosworth.D.

## 2017-05-21 ENCOUNTER — Telehealth: Payer: Self-pay | Admitting: Cardiology

## 2017-05-21 NOTE — Telephone Encounter (Signed)
Patient calling, would like to verify the dosage for the Torsemide medication. Thanks.[name]

## 2017-05-21 NOTE — Telephone Encounter (Signed)
Patient states she got her new Rx from [facility] and the pills look different. Confirmed with patient she is to take Demadex 20 mg BID and she may take an extra dose as needed for weight gain of 3 pounds. She understands they may have gotten a different distributor, but as long as they are 20 mg tablets they are correct. She was grateful for assistance.

## 2017-07-27 ENCOUNTER — Telehealth: Payer: Self-pay | Admitting: Cardiology

## 2017-07-27 ENCOUNTER — Ambulatory Visit (INDEPENDENT_AMBULATORY_CARE_PROVIDER_SITE_OTHER): Payer: Medicare Other | Admitting: *Deleted

## 2017-07-27 DIAGNOSIS — I482 Chronic atrial fibrillation, unspecified: Secondary | ICD-10-CM

## 2017-07-27 NOTE — Telephone Encounter (Signed)
Spoke with pt and reminded pt of remote transmission that is due today. Pt verbalized understanding.   

## 2017-07-27 NOTE — Progress Notes (Signed)
Remote pacemaker transmission.   

## 2017-07-28 LAB — CUP PACEART REMOTE DEVICE CHECK
Battery Remaining Longevity: 65 mo
Battery Voltage: 3.01 V
Brady Statistic RA Percent Paced: 0 %
Brady Statistic RV Percent Paced: 99.04 %
Date Time Interrogation Session: 20180910224750
Implantable Lead Implant Date: 20071114
Implantable Lead Location: 753858
Implantable Lead Model: 5076
Implantable Pulse Generator Implant Date: 20160127
Lead Channel Impedance Value: 323 Ohm
Lead Channel Impedance Value: 513 Ohm
Lead Channel Pacing Threshold Amplitude: 0.625 V
Lead Channel Pacing Threshold Pulse Width: 0.4 ms
Lead Channel Sensing Intrinsic Amplitude: 0.25 mV
Lead Channel Sensing Intrinsic Amplitude: 13.125 mV
Lead Channel Sensing Intrinsic Amplitude: 13.125 mV
Lead Channel Setting Pacing Amplitude: 1.75 V
Lead Channel Setting Pacing Amplitude: 2.5 V
Lead Channel Setting Pacing Pulse Width: 0.6 ms
MDC IDC LEAD IMPLANT DT: 20030908
MDC IDC LEAD IMPLANT DT: 20030908
MDC IDC LEAD LOCATION: 753859
MDC IDC LEAD LOCATION: 753860
MDC IDC MSMT LEADCHNL LV IMPEDANCE VALUE: 399 Ohm
MDC IDC MSMT LEADCHNL LV IMPEDANCE VALUE: 475 Ohm
MDC IDC MSMT LEADCHNL LV IMPEDANCE VALUE: 513 Ohm
MDC IDC MSMT LEADCHNL LV IMPEDANCE VALUE: 589 Ohm
MDC IDC MSMT LEADCHNL LV IMPEDANCE VALUE: 627 Ohm
MDC IDC MSMT LEADCHNL RA IMPEDANCE VALUE: 304 Ohm
MDC IDC MSMT LEADCHNL RA SENSING INTR AMPL: 0.375 mV
MDC IDC MSMT LEADCHNL RV IMPEDANCE VALUE: 551 Ohm
MDC IDC SET LEADCHNL RV PACING PULSEWIDTH: 0.4 ms
MDC IDC SET LEADCHNL RV SENSING SENSITIVITY: 5.6 mV
MDC IDC STAT BRADY AP VP PERCENT: 0 %
MDC IDC STAT BRADY AP VS PERCENT: 0 %
MDC IDC STAT BRADY AS VP PERCENT: 0 %
MDC IDC STAT BRADY AS VS PERCENT: 0 %

## 2017-07-29 ENCOUNTER — Encounter: Payer: Self-pay | Admitting: Cardiology

## 2017-08-01 ENCOUNTER — Other Ambulatory Visit: Payer: Self-pay | Admitting: Physician Assistant

## 2017-08-01 ENCOUNTER — Other Ambulatory Visit: Payer: Self-pay | Admitting: Cardiology

## 2017-09-10 ENCOUNTER — Ambulatory Visit: Payer: Medicare Other | Admitting: Cardiology

## 2017-10-06 ENCOUNTER — Inpatient Hospital Stay (HOSPITAL_COMMUNITY)
Admission: EM | Admit: 2017-10-06 | Discharge: 2017-10-09 | DRG: 194 | Disposition: A | Discharge status: Home / Self Care | Payer: Medicare Other | Attending: Internal Medicine | Admitting: Internal Medicine

## 2017-10-06 ENCOUNTER — Encounter (HOSPITAL_COMMUNITY): Payer: Self-pay | Admitting: Emergency Medicine

## 2017-10-06 ENCOUNTER — Emergency Department (HOSPITAL_COMMUNITY): Payer: Medicare Other

## 2017-10-06 ENCOUNTER — Other Ambulatory Visit: Payer: Self-pay

## 2017-10-06 DIAGNOSIS — I5032 Chronic diastolic (congestive) heart failure: Secondary | ICD-10-CM

## 2017-10-06 DIAGNOSIS — I482 Chronic atrial fibrillation: Secondary | ICD-10-CM | POA: Diagnosis present

## 2017-10-06 DIAGNOSIS — J470 Bronchiectasis with acute lower respiratory infection: Secondary | ICD-10-CM | POA: Diagnosis present

## 2017-10-06 DIAGNOSIS — J101 Influenza due to other identified influenza virus with other respiratory manifestations: Secondary | ICD-10-CM | POA: Diagnosis not present

## 2017-10-06 DIAGNOSIS — R0603 Acute respiratory distress: Secondary | ICD-10-CM

## 2017-10-06 DIAGNOSIS — N179 Acute kidney failure, unspecified: Secondary | ICD-10-CM | POA: Diagnosis not present

## 2017-10-06 DIAGNOSIS — K7460 Unspecified cirrhosis of liver: Secondary | ICD-10-CM | POA: Diagnosis present

## 2017-10-06 DIAGNOSIS — Z79899 Other long term (current) drug therapy: Secondary | ICD-10-CM | POA: Diagnosis not present

## 2017-10-06 DIAGNOSIS — R05 Cough: Secondary | ICD-10-CM | POA: Diagnosis not present

## 2017-10-06 DIAGNOSIS — I5042 Chronic combined systolic (congestive) and diastolic (congestive) heart failure: Secondary | ICD-10-CM | POA: Diagnosis not present

## 2017-10-06 DIAGNOSIS — Z87891 Personal history of nicotine dependence: Secondary | ICD-10-CM | POA: Diagnosis not present

## 2017-10-06 DIAGNOSIS — N183 Chronic kidney disease, stage 3 (moderate): Secondary | ICD-10-CM | POA: Diagnosis not present

## 2017-10-06 DIAGNOSIS — J40 Bronchitis, not specified as acute or chronic: Secondary | ICD-10-CM | POA: Diagnosis present

## 2017-10-06 DIAGNOSIS — I480 Paroxysmal atrial fibrillation: Secondary | ICD-10-CM | POA: Diagnosis present

## 2017-10-06 DIAGNOSIS — Z952 Presence of prosthetic heart valve: Secondary | ICD-10-CM | POA: Diagnosis not present

## 2017-10-06 DIAGNOSIS — R0902 Hypoxemia: Secondary | ICD-10-CM | POA: Diagnosis present

## 2017-10-06 DIAGNOSIS — J471 Bronchiectasis with (acute) exacerbation: Secondary | ICD-10-CM

## 2017-10-06 DIAGNOSIS — Z6838 Body mass index (BMI) 38.0-38.9, adult: Secondary | ICD-10-CM | POA: Diagnosis not present

## 2017-10-06 DIAGNOSIS — R55 Syncope and collapse: Secondary | ICD-10-CM | POA: Diagnosis not present

## 2017-10-06 DIAGNOSIS — E860 Dehydration: Secondary | ICD-10-CM | POA: Diagnosis present

## 2017-10-06 DIAGNOSIS — M109 Gout, unspecified: Secondary | ICD-10-CM | POA: Diagnosis present

## 2017-10-06 DIAGNOSIS — I951 Orthostatic hypotension: Secondary | ICD-10-CM | POA: Diagnosis present

## 2017-10-06 DIAGNOSIS — I130 Hypertensive heart and chronic kidney disease with heart failure and stage 1 through stage 4 chronic kidney disease, or unspecified chronic kidney disease: Secondary | ICD-10-CM | POA: Diagnosis present

## 2017-10-06 DIAGNOSIS — Z950 Presence of cardiac pacemaker: Secondary | ICD-10-CM | POA: Diagnosis not present

## 2017-10-06 DIAGNOSIS — R059 Cough, unspecified: Secondary | ICD-10-CM

## 2017-10-06 DIAGNOSIS — I2722 Pulmonary hypertension due to left heart disease: Secondary | ICD-10-CM | POA: Diagnosis present

## 2017-10-06 DIAGNOSIS — M10072 Idiopathic gout, left ankle and foot: Secondary | ICD-10-CM | POA: Diagnosis not present

## 2017-10-06 DIAGNOSIS — Z23 Encounter for immunization: Secondary | ICD-10-CM

## 2017-10-06 HISTORY — DX: Acute respiratory distress: R06.03

## 2017-10-06 LAB — BASIC METABOLIC PANEL
ANION GAP: 13 (ref 5–15)
BUN: 36 mg/dL — ABNORMAL HIGH (ref 6–20)
CALCIUM: 8.4 mg/dL — AB (ref 8.9–10.3)
CO2: 24 mmol/L (ref 22–32)
Chloride: 108 mmol/L (ref 101–111)
Creatinine, Ser: 2.07 mg/dL — ABNORMAL HIGH (ref 0.44–1.00)
GFR, EST AFRICAN AMERICAN: 23 mL/min — AB (ref >60)
GFR, EST NON AFRICAN AMERICAN: 20 mL/min — AB (ref >60)
Glucose, Bld: 101 mg/dL — ABNORMAL HIGH (ref 65–99)
Potassium: 3.7 mmol/L (ref 3.5–5.1)
SODIUM: 145 mmol/L (ref 135–145)

## 2017-10-06 LAB — I-STAT TROPONIN, ED: TROPONIN I, POC: 0.02 ng/mL (ref 0.00–0.08)

## 2017-10-06 LAB — CREATININE, SERUM
Creatinine, Ser: 2.63 mg/dL — ABNORMAL HIGH (ref 0.44–1.00)
GFR calc non Af Amer: 15 mL/min — ABNORMAL LOW (ref >60)
GFR, EST AFRICAN AMERICAN: 17 mL/min — AB (ref >60)

## 2017-10-06 LAB — CBC
HCT: 42.4 % (ref 36.0–46.0)
HEMATOCRIT: 37.8 % (ref 36.0–46.0)
HEMOGLOBIN: 13.4 g/dL (ref 12.0–15.0)
Hemoglobin: 11.8 g/dL — ABNORMAL LOW (ref 12.0–15.0)
MCH: 29.3 pg (ref 26.0–34.0)
MCH: 29 pg (ref 26.0–34.0)
MCHC: 31.6 g/dL (ref 30.0–36.0)
MCHC: 31.2 g/dL (ref 30.0–36.0)
MCV: 92.6 fL (ref 78.0–100.0)
MCV: 92.9 fL (ref 78.0–100.0)
PLATELETS: 127 10*3/uL — AB (ref 150–400)
Platelets: 137 10*3/uL — ABNORMAL LOW (ref 150–400)
RBC: 4.58 MIL/uL (ref 3.87–5.11)
RBC: 4.07 MIL/uL (ref 3.87–5.11)
RDW: 14.2 % (ref 11.5–15.5)
RDW: 14.4 % (ref 11.5–15.5)
WBC: 7.4 10*3/uL (ref 4.0–10.5)
WBC: 4 10*3/uL (ref 4.0–10.5)

## 2017-10-06 LAB — BRAIN NATRIURETIC PEPTIDE: B Natriuretic Peptide: 149.6 pg/mL — ABNORMAL HIGH (ref 0.0–100.0)

## 2017-10-06 MED ORDER — FEBUXOSTAT 40 MG PO TABS
40.0000 mg | ORAL_TABLET | Freq: Every day | ORAL | Status: DC
  Administered 2017-10-06 – 2017-10-09 (×4): 40 mg via ORAL
  Filled 2017-10-06 (×4): qty 1

## 2017-10-06 MED ORDER — FERROUS SULFATE 325 (65 FE) MG PO TABS
325.0000 mg | ORAL_TABLET | Freq: Two times a day (BID) | ORAL | Status: DC
  Administered 2017-10-06 – 2017-10-09 (×7): 325 mg via ORAL
  Filled 2017-10-06 (×7): qty 1

## 2017-10-06 MED ORDER — MIDODRINE HCL 5 MG PO TABS
2.5000 mg | ORAL_TABLET | Freq: Three times a day (TID) | ORAL | Status: DC
  Administered 2017-10-07 – 2017-10-09 (×8): 2.5 mg via ORAL
  Filled 2017-10-06 (×9): qty 1

## 2017-10-06 MED ORDER — ACETAMINOPHEN 500 MG PO TABS
1000.0000 mg | ORAL_TABLET | Freq: Four times a day (QID) | ORAL | Status: DC | PRN
  Administered 2017-10-08: 1000 mg via ORAL
  Filled 2017-10-06: qty 2

## 2017-10-06 MED ORDER — SODIUM CHLORIDE 0.9 % IV SOLN
INTRAVENOUS | Status: DC
  Administered 2017-10-06 – 2017-10-08 (×2): via INTRAVENOUS

## 2017-10-06 MED ORDER — ONDANSETRON HCL 4 MG/2ML IJ SOLN: 4.0000 mg | Freq: Four times a day (QID) | INTRAMUSCULAR | Status: DC | PRN

## 2017-10-06 MED ORDER — DEXTROSE 5 % IV SOLN
500.0000 mg | INTRAVENOUS | Status: DC
  Administered 2017-10-06: 500 mg via INTRAVENOUS
  Filled 2017-10-06 (×2): qty 500

## 2017-10-06 MED ORDER — HYDROCOD POLST-CPM POLST ER 10-8 MG/5ML PO SUER: 5.0000 mL | Freq: Every evening | ORAL | Status: DC | PRN

## 2017-10-06 MED ORDER — IPRATROPIUM-ALBUTEROL 0.5-2.5 (3) MG/3ML IN SOLN: 3.0000 mL | RESPIRATORY_TRACT | Status: DC | PRN

## 2017-10-06 MED ORDER — SODIUM CHLORIDE 0.9 % IV BOLUS (SEPSIS)
500.0000 mL | Freq: Once | INTRAVENOUS | Status: AC
  Administered 2017-10-06: 500 mL via INTRAVENOUS

## 2017-10-06 MED ORDER — MIDODRINE HCL 2.5 MG PO TABS
2.5000 mg | ORAL_TABLET | Freq: Once | ORAL | Status: AC
  Administered 2017-10-06: 2.5 mg via ORAL
  Filled 2017-10-06: qty 1

## 2017-10-06 MED ORDER — ADULT MULTIVITAMIN W/MINERALS CH
1.0000 | ORAL_TABLET | Freq: Every day | ORAL | Status: DC
  Administered 2017-10-06 – 2017-10-09 (×4): 1 via ORAL
  Filled 2017-10-06 (×6): qty 1

## 2017-10-06 MED ORDER — OCUVITE-LUTEIN PO CAPS
1.0000 | ORAL_CAPSULE | Freq: Every day | ORAL | Status: DC
  Administered 2017-10-06 – 2017-10-09 (×4): 1 via ORAL
  Filled 2017-10-06 (×4): qty 1

## 2017-10-06 MED ORDER — POLYVINYL ALCOHOL 1.4 % OP SOLN
1.0000 [drp] | OPHTHALMIC | Status: DC | PRN
  Filled 2017-10-06: qty 15

## 2017-10-06 MED ORDER — ALBUTEROL SULFATE (2.5 MG/3ML) 0.083% IN NEBU
5.0000 mg | INHALATION_SOLUTION | Freq: Once | RESPIRATORY_TRACT | Status: AC
  Administered 2017-10-06: 5 mg via RESPIRATORY_TRACT
  Filled 2017-10-06: qty 6

## 2017-10-06 MED ORDER — LORATADINE 10 MG PO TABS
10.0000 mg | ORAL_TABLET | Freq: Every day | ORAL | Status: DC
Start: 2017-10-06 — End: 2017-10-09
  Administered 2017-10-06 – 2017-10-09 (×4): 10 mg via ORAL
  Filled 2017-10-06 (×5): qty 1

## 2017-10-06 MED ORDER — IPRATROPIUM-ALBUTEROL 0.5-2.5 (3) MG/3ML IN SOLN
3.0000 mL | Freq: Four times a day (QID) | RESPIRATORY_TRACT | Status: DC
  Administered 2017-10-06 – 2017-10-08 (×8): 3 mL via RESPIRATORY_TRACT
  Filled 2017-10-06 (×8): qty 3

## 2017-10-06 MED ORDER — ONDANSETRON HCL 4 MG PO TABS: 4.0000 mg | ORAL_TABLET | Freq: Four times a day (QID) | ORAL | Status: DC | PRN

## 2017-10-06 MED ORDER — ENOXAPARIN SODIUM 30 MG/0.3ML ~~LOC~~ SOLN
30.0000 mg | SUBCUTANEOUS | Status: DC
  Administered 2017-10-06 – 2017-10-08 (×3): 30 mg via SUBCUTANEOUS
  Filled 2017-10-06 (×3): qty 0.3

## 2017-10-06 MED ORDER — CARBOXYMETHYLCELLUL-GLYCERIN 0.5-0.9 % OP SOLN
1.0000 [drp] | Freq: Four times a day (QID) | OPHTHALMIC | Status: DC | PRN
  Filled 2017-10-06: qty 15

## 2017-10-06 MED ORDER — IPRATROPIUM BROMIDE 0.02 % IN SOLN
0.5000 mg | Freq: Once | RESPIRATORY_TRACT | Status: AC
  Administered 2017-10-06: 0.5 mg via RESPIRATORY_TRACT
  Filled 2017-10-06: qty 2.5

## 2017-10-06 MED ORDER — INFLUENZA VAC SPLIT HIGH-DOSE 0.5 ML IM SUSY
0.5000 mL | PREFILLED_SYRINGE | INTRAMUSCULAR | Status: DC
  Filled 2017-10-06: qty 0.5

## 2017-10-06 MED ORDER — DEXTROSE 5 % IV SOLN
1.0000 g | INTRAVENOUS | Status: DC
  Administered 2017-10-06: 1 g via INTRAVENOUS
  Filled 2017-10-06 (×2): qty 10

## 2017-10-06 NOTE — H&P (Signed)
History and Physical    [name] RCV:[id number] DOB: 28-Oct-1926 DOA: 10/06/2017  Referring MD/NP/PA: Dr. Canary Brim  PCP: [name], MD   Patient coming from: home  Chief Complaint: cough and dizziness   HPI: [name] is a 83 y.o. female with known a-fib, orthostatic problems requiring midodrine, VHD w/MV repair in 2003, NSVT, chronic combined CHD, HTN, presented with main concern of syncopal episode that occurred earlier in the day prior to this admission. Pt explains she woke up this, was heading towards bathroom and felt lightheaded, blurry vision and she passed out. She explains that she felt somewhat out of it for few days with malaise and fatigue, cough that is mixed non productive and productive of yellow sputum. This was associated with dyspnea that was initially present with exertion but has progressed to dyspnea at rest and is now associated with wheezing. She also reports chest tightness that is mostly present with coughing spells. Pt denies any known sick contacts but feels like she cough a flu.   ED Course: In ED, pt is hemodynamically stable but noted to be in mild distress due to coughing spell. RR in mid 20's, HR recorded in 70's but on exam her HR was as high as 110's. Blood work notable for slight bump in Cr ~2 which is up from recent baseline of ~ 1.5 - 1.6. Cardiology has seen patient in consultation. TRH asked to admit for further evaluation.   Review of Systems:  Constitutional: Positive for activity change, appetite change and fatigue.  HENT: Negative for ear pain, nosebleeds, facial swelling, rhinorrhea, neck pain, neck stiffness and ear discharge.   Eyes: Negative for pain, discharge, redness, itching and visual disturbance.  Respiratory: Positive for cough, chest tightness, shortness of breath, wheezing  Cardiovascular: Negative for palpitations and leg swelling.  Gastrointestinal: Negative for abdominal distention.  Genitourinary: Negative for  dysuria, urgency, frequency, hematuria, flank pain, decreased urine volume, difficulty urinating and dyspareunia.  Musculoskeletal: Negative for back pain, joint swelling, arthralgias Neurological: Negative for dizziness, tremors, numbness and headaches.  Hematological: Negative for adenopathy. Does not bruise/bleed easily.  Psychiatric/Behavioral: Negative for hallucinations, behavioral problems, confusion  Past Medical History:  Diagnosis Date  . Anemia   . Brady-tachy syndrome ([place])    a. 2003: post-op afib after MR repair then symptomatic bradycardia requiring pacemaker. b. Upgrade to Medtronic Bi-V Pacemaker 2007.  . Cellulitis and abscess of foot 08/15/2015   RT FOOT  . Chronic atrial fibrillation ([place])    a. Previously failed DCCV/amio/tikosyn.  CHADS2VASC score 5 - not on anticoagulation due to history of GI bleeding.    . Chronic combined systolic and diastolic CHF (congestive heart failure) (HCC)    a. EF 35-40% on echo 09/2013 b. echo 04/2016: EF 50-55% with mild to moderate MS.  . Cirrhosis ([place])    a. Newly recognized 09/2013 - by CT.  . CKD (chronic kidney disease)   . GI bleeding   . Hypertension   . Mitral valve disorder    a. Severe MR s/p repair 2003 (28 mm annuloplasty ring and and oversew of LAA). No CAD by cath at that time.  [name] NSVT (nonsustained ventricular tachycardia) ([place])    a. Isolated event during 09/2007 adm.  . Orthostatic hypotension dysautonomic syndrome ([place])   . Pulmonary hypertension (HCC)    Group II secondary to CHF and MV disease  . Spinal stenosis    shes recieved epidural steroid injections in the past  . Tricuspid regurgitation  Past Surgical History:  Procedure Laterality Date  . APPENDECTOMY    . BIV PACEMAKER GENERATOR CHANGE OUT N/A 12/13/2014   Procedure: BIV PACEMAKER GENERATOR CHANGE OUT;  Surgeon: Evans Lance, MD;  Location: [facility] CATH LAB;  Service: Cardiovascular;  Laterality: N/A;  . CHOLECYSTECTOMY    . IRRIGATION AND  DEBRIDEMENT ABSCESS N/A 10/14/2013   Procedure: IRRIGATION AND DEBRIDEMENT PERINEAL ABSCESS;  Surgeon: Rolm Bookbinder, MD;  Location: [facility];  Service: General;  Laterality: N/A;  . VALVE REPLACEMENT     sever mitral regurgitation s/p mitral valve annuloplasty ring   Social Hx:  reports that she quit smoking about 46 years ago. she has never used smokeless tobacco. She reports that she drinks alcohol. She reports that she does not use drugs.  No Known Allergies  Family History  Problem Relation Age of Onset  . Pancreatic cancer Father   . Other Mother        Mother died at 49 with no real medical problems    Medication Sig  acetaminophen (TYLENOL) 500 MG tablet Take 1,000 mg by mouth every 6 (six) hours as needed (pain).  cetirizine (ZYRTEC) 10 MG tablet Take 10 mg by mouth daily.   Febuxostat (ULORIC) 80 MG TABS Take 80 mg by mouth daily.  ferrous sulfate 325 (65 FE) MG tablet Take 1 tablet (325 mg total) by mouth 2 (two) times daily with a meal.  midodrine (PROAMATINE) 2.5 MG tablet Take 1 tablet (2.5 mg total) by mouth 3 (three) times daily with meals.  potassium chloride SA (K-DUR,KLOR-CON) 20 MEQ tablet TAKE 1 TABLET BY MOUTH TWO  TIMES DAILY  torsemide (DEMADEX) 20 MG tablet TAKE 1 TABLET BY MOUTH 2  TIMES DAILY. TAKE EXTRA  TABLET IF WEIGHT GAIN  GREATER THAN 3 LBS IN ONE  DAY  colchicine 0.6 MG tablet Take 0.6 mg by mouth daily.    Physical Exam: Vitals:   10/06/17 1345 10/06/17 1430 10/06/17 1445 10/06/17 1530  BP: (!) 91/40 (!) 99/49 (!) 102/52 (!) 106/52  Pulse: 69 72 74 72  Resp: 20 18 19 20   Temp:      TempSrc:      SpO2: 95% 91% 94% 93%    Constitutional: in mild distress due to dyspnea  Vitals:   10/06/17 1345 10/06/17 1430 10/06/17 1445 10/06/17 1530  BP: (!) 91/40 (!) 99/49 (!) 102/52 (!) 106/52  Pulse: 69 72 74 72  Resp: 20 18 19 20   Temp:      TempSrc:      SpO2: 95% 91% 94% 93%   Eyes: PERRL, lids and conjunctivae normal ENMT: Mucous membranes  are dry. Posterior pharynx clear of any exudate or lesions.Normal dentition.  Neck: normal, supple, no masses, no thyromegaly Respiratory: course breath sounds with wheezing on inspiration and expiration, rhonchi bilaterally   Cardiovascular: Regular rate and rhythm, no murmurs / rubs / gallops. 2+ pedal pulses. No carotid bruits.  Abdomen: no tenderness, no masses palpated. No hepatosplenomegaly. Bowel sounds positive.  Musculoskeletal: no clubbing / cyanosis. No joint deformity upper and lower extremities. Good ROM, no contractures.  Skin: no rashes, lesions, ulcers. No induration Neurologic: CN 2-12 grossly intact. Sensation intact, DTR normal. Strength 5/5 in all 4.  Psychiatric: Normal judgment and insight. Alert and oriented x 3. Normal mood.   Labs on Admission: I have personally reviewed following labs and imaging studies  CBC: Recent Labs  Lab 10/06/17 1011  WBC 7.4  HGB 13.4  HCT 42.4  MCV 92.6  PLT 863*   Basic Metabolic Panel: Recent Labs  Lab 10/06/17 1011  NA 145  K 3.7  CL 108  CO2 24  GLUCOSE 101*  BUN 36*  CREATININE 2.07*  CALCIUM 8.4*   Urine analysis:    Component Value Date/Time   COLORURINE YELLOW 11/27/2013 1423   APPEARANCEUR CLOUDY (A) 11/27/2013 1423   LABSPEC 1.008 11/27/2013 1423   PHURINE 5.0 11/27/2013 1423   GLUCOSEU NEGATIVE 11/27/2013 1423   HGBUR SMALL (A) 11/27/2013 1423   BILIRUBINUR NEGATIVE 11/27/2013 1423   KETONESUR NEGATIVE 11/27/2013 1423   PROTEINUR NEGATIVE 11/27/2013 1423   UROBILINOGEN 0.2 11/27/2013 1423   NITRITE NEGATIVE 11/27/2013 1423   LEUKOCYTESUR LARGE (A) 11/27/2013 1423   Radiological Exams on Admission: Dg Chest 2 View  Result Date: 10/06/2017 CLINICAL DATA:  Syncope EXAM: CHEST  2 VIEW COMPARISON:  05/16/2016 FINDINGS: Left subclavian pacemaker device and leads are stable. Postoperative changes from mitral replacement. Cardiomegaly. Normal vascularity. No pneumothorax or pleural effusion. Osteopenia.  Stable thoracic spine. IMPRESSION: Cardiomegaly without decompensation. Electronically Signed   By: Marybelle Killings M.D.   On: 10/06/2017 11:00    EKG: pending   Assessment/Plan Active Problems:   Acute Respiratory distress - unclear etiology, suspect viral component but PNA not entirely ruled out - pt with significant rhonchi and wheezing, course breaths sounds - will ask for CT chest, respiratory panel, flu panel  - provide BD's scheduled and as needed - antitussives as needed  - treat with empiric abx for now and narrow down if no evidence of PNA on CXR - sputum cultures pending  - keep on oxygen via [place] as needed     Acute kidney injury imposed on CKD stage III - Cr at baseline ~ 1.5 - 1.6 - suspect pre renal etiology - provide IVF, hold torsemide and colchicine until renal function stabilizes  - BMP In AM    Syncope  - suspect component of dehydration contributing but pt with already known orthostatic hypotension  - provide hydration and PT eval in AM - continue home regimen with Midodrine     Permanent A-fib - CHADS2 vasc 4 - no AC due to GIB    Combined CHF - will monitor daily weights and intake/output - holding torsemide today     Obesity  - Body mass index is 37.15 kg/m.    DVT prophylaxis: Lovenox SQ Code Status: Full Family Communication: Pt updated at bedside Disposition Plan: admit to inpatient tele  Consults called: cardiology  Admission status: Inpatient   Faye Ramsay MD [facility] Pager [phone]  If 7PM-7AM, please contact night-coverage [email or web address] Password [email or web address]  10/06/2017, 4:31 PM

## 2017-10-06 NOTE — ED Provider Notes (Signed)
[facility] EMERGENCY DEPARTMENT Provider Note   CSN: [id number] Arrival date & time: 10/06/17  9798     History   Chief Complaint Chief Complaint  Patient presents with  . Shortness of Breath  . Fall  . Cough  . Hypotension    HPI [name] is a 83 y.o. female.  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 Most recent echocardiogram, 04/2016, showed an improved EF of 50-55% with mild to moderate mitral stenosis. Due to hypotension she is unable to tolerate BB or ACE- I. She is on Midodrine.  Patient presents today with complaint of cough which is been ongoing for 1 week causing shortness of breath.  This morning she felt faint and did syncopized falling to the floor.  This happened twice this morning.  She has not had a syncopal episode for over one year.  She denies any chest pain.  She states she has lost weight during her recent illness and does not have any lower extremity swelling.  She denies fever.  She has had no recent changes in medications.    Past Medical History:  Diagnosis Date  . Anemia   . Brady-tachy syndrome ([place])    a. 2003: post-op afib after MR repair then symptomatic bradycardia requiring pacemaker. b. Upgrade to Medtronic Bi-V Pacemaker 2007.  . Cellulitis and abscess of foot 08/15/2015   RT FOOT  . Chronic atrial fibrillation ([place])    a. Previously failed DCCV/amio/tikosyn.  CHADS2VASC score 5 - not on anticoagulation due to history of GI bleeding.    . Chronic combined systolic and diastolic CHF (congestive heart failure) (HCC)    a. EF 35-40% on echo 09/2013 b. echo 04/2016: EF 50-55% with mild to moderate MS.  .  Cirrhosis ([place])    a. Newly recognized 09/2013 - by CT.  . CKD (chronic kidney disease)   . GI bleeding   . Hypertension   . Mitral valve disorder    a. Severe MR s/p repair 2003 (28 mm annuloplasty ring and and oversew of LAA). No CAD by cath at that time.  [name] NSVT (nonsustained ventricular tachycardia) ([place])    a. Isolated event during 09/2007 adm.  . Orthostatic hypotension dysautonomic syndrome ([place])   . Pulmonary hypertension (HCC)    Group II secondary to CHF and MV disease  . Spinal stenosis    shes recieved epidural steroid injections in the past  . Tricuspid regurgitation     Patient Active Problem List   Diagnosis Date Noted  . Respiratory distress 10/06/2017  . Orthostatic hypotension dysautonomic syndrome ([place])   . Acute renal failure superimposed on stage 3 chronic kidney disease ([place]) 05/21/2016  . Cellulitis of leg, right 08/18/2015  . NSVT (nonsustained ventricular tachycardia) ([place])   . Gout flare 08/16/2015  . Abnormal thyroid function test 08/15/2015  . Right ankle pain 08/15/2015  . History of gout 08/15/2015  . Ankle pain   . Mitral regurgitation 01/05/2014  . S/P MVR (mitral valve repair) 01/05/2014  . Acute posthemorrhagic anemia 12/29/2013  . Encounter for therapeutic drug monitoring 12/12/2013  . UTI (lower urinary tract infection) 11/27/2013  . Gout 11/07/2013  . Orthostatic hypotension 10/31/2013  .  Chronic combined systolic and diastolic CHF, NYHA class 2 and ACA/AHA stage C ([place])   . Hypertension   . Mitral valve disorder   . Permanent atrial fibrillation ([place]) 10/28/2013  . Biventricular cardiac pacemaker in situ 10/14/2013  . Perirectal abscess 10/13/2013  . Chronic anticoagulation 10/10/2013  . Liver cirrhosis ([place]) 10/10/2013  . Thrombocytopenia ([place]) 10/10/2013  . Nausea and vomiting 10/10/2013  . CKD (chronic kidney disease) stage 3, GFR 30-59 ml/min (HCC) 10/10/2013  . Anemia 10/10/2013    Past Surgical History:  Procedure  Laterality Date  . APPENDECTOMY    . BIV PACEMAKER GENERATOR CHANGE OUT N/A 12/13/2014   Procedure: BIV PACEMAKER GENERATOR CHANGE OUT;  Surgeon: Evans Lance, MD;  Location: [facility] CATH LAB;  Service: Cardiovascular;  Laterality: N/A;  . CHOLECYSTECTOMY    . IRRIGATION AND DEBRIDEMENT ABSCESS N/A 10/14/2013   Procedure: IRRIGATION AND DEBRIDEMENT PERINEAL ABSCESS;  Surgeon: Rolm Bookbinder, MD;  Location: [facility];  Service: General;  Laterality: N/A;  . VALVE REPLACEMENT     sever mitral regurgitation s/p mitral valve annuloplasty ring    OB History    No data available       Home Medications    Prior to Admission medications   Medication Sig Start Date End Date Taking? Authorizing Provider  acetaminophen (TYLENOL) 500 MG tablet Take 1,000 mg by mouth every 6 (six) hours as needed (pain).   Yes [provider]  beta carotene w/minerals (OCUVITE) tablet Take 1 tablet by mouth daily after supper.    Yes [provider]  carboxymethylcellul-glycerin (OPTIVE) 0.5-0.9 % ophthalmic solution Place 1 drop into both eyes daily as needed for dry eyes.   Yes [provider]  cetirizine (ZYRTEC) 10 MG tablet Take 10 mg by mouth daily.    Yes [provider]  Febuxostat (ULORIC) 80 MG TABS Take 80 mg by mouth daily.   Yes [provider]  ferrous sulfate 325 (65 FE) MG tablet Take 1 tablet (325 mg total) by mouth 2 (two) times daily with a meal. 05/22/16  Yes Strader, [place] M, PA-C  midodrine (PROAMATINE) 2.5 MG tablet Take 1 tablet (2.5 mg total) by mouth 3 (three) times daily with meals. 11/21/16  Yes Turner, Eber Hong, MD  Multiple Vitamin (MULTIVITAMIN WITH MINERALS) TABS tablet Take 1 tablet by mouth daily.   Yes [provider]  potassium chloride SA (K-DUR,KLOR-CON) 20 MEQ tablet TAKE 1 TABLET BY MOUTH TWO  TIMES DAILY 08/03/17  Yes Turner, Traci R, MD  torsemide (DEMADEX) 20 MG tablet TAKE 1 TABLET BY MOUTH 2  TIMES DAILY. TAKE EXTRA  TABLET IF  WEIGHT GAIN  GREATER THAN 3 LBS IN ONE  DAY 08/03/17  Yes Turner, Traci R, MD  colchicine 0.6 MG tablet Take 0.6 mg by mouth daily.    [provider]    Family History Family History  Problem Relation Age of Onset  . Pancreatic cancer Father   . Other Mother        Mother died at 51 with no real medical problems    Social History Social History   Tobacco Use  . Smoking status: Former Smoker    Last attempt to quit: 10/11/1971    Years since quitting: 46.0  . Smokeless tobacco: Never Used  Substance Use Topics  . Alcohol use: Yes    Alcohol/week: 0.0 oz    Comment: rare  . Drug use: No     Allergies   Patient has no known  allergies.   Review of Systems Review of Systems  ROS reviewed and all otherwise negative except for mentioned in HPI   Physical Exam Updated Vital Signs BP (!) 111/42   Pulse 71   Temp 98.6 F (37 C) (Oral)   Resp 17   LMP 10/10/2013   SpO2 91%  Vitals reviewed Physical Exam  Physical Examination: General appearance - alert, chronically ill appearing, and in no distress Mental status - alert, oriented to person, place, and time Eyes - no conjunctival injection, no scleral icterus Mouth - mucous membranes moist, pharynx normal without lesions Neck - supple, no significant adenopathy Chest - BSS, loud congestion, no wheezing, mild tachypnea, scattered rhonchi Heart - normal rate, regular rhythm, normal S1, S2, no murmurs, rubs, clicks or gallops Abdomen - soft, nontender, nondistended, no masses or organomegaly Neurological - alert, oriented, normal speech Extremities - peripheral pulses normal, 1+ pedal edema bilaterally, no clubbing or cyanosis Skin - normal coloration and turgor, no rashes   ED Treatments / Results  Labs (all labs ordered are listed, but only abnormal results are displayed) Labs Reviewed  BASIC METABOLIC PANEL - Abnormal; Notable for the following components:      Result Value   Glucose, Bld 101 (*)     BUN 36 (*)    Creatinine, Ser 2.07 (*)    Calcium 8.4 (*)    GFR calc non Af Amer 20 (*)    GFR calc Af Amer 23 (*)    All other components within normal limits  CBC - Abnormal; Notable for the following components:   Platelets 137 (*)    All other components within normal limits  BRAIN NATRIURETIC PEPTIDE - Abnormal; Notable for the following components:   B Natriuretic Peptide 149.6 (*)    All other components within normal limits  I-STAT TROPONIN, ED    EKG  EKG Interpretation  Date/Time:  Tuesday October 06 2017 10:05:18 EST Ventricular Rate:  79 PR Interval:    QRS Duration: 148 QT Interval:  462 QTC Calculation: 529 R Axis:   -80 Text Interpretation:  Electronic ventricular pacemaker No significant change since last tracing Confirmed by Alfonzo Beers [phone]) on 10/06/2017 10:53:03 AM       Radiology Dg Chest 2 View  Result Date: 10/06/2017 CLINICAL DATA:  Syncope EXAM: CHEST  2 VIEW COMPARISON:  05/16/2016 FINDINGS: Left subclavian pacemaker device and leads are stable. Postoperative changes from mitral replacement. Cardiomegaly. Normal vascularity. No pneumothorax or pleural effusion. Osteopenia. Stable thoracic spine. IMPRESSION: Cardiomegaly without decompensation. Electronically Signed   By: Marybelle Killings M.D.   On: 10/06/2017 11:00    Procedures Procedures (including critical care time)  Medications Ordered in ED Medications  sodium chloride 0.9 % bolus 500 mL (not administered)  albuterol (PROVENTIL) (2.5 MG/3ML) 0.083% nebulizer solution 5 mg (5 mg Nebulization Given 10/06/17 1121)  midodrine (PROAMATINE) tablet 2.5 mg (2.5 mg Oral Given 10/06/17 1243)  albuterol (PROVENTIL) (2.5 MG/3ML) 0.083% nebulizer solution 5 mg (5 mg Nebulization Given 10/06/17 1631)  ipratropium (ATROVENT) nebulizer solution 0.5 mg (0.5 mg Nebulization Given 10/06/17 1631)     Initial Impression / Assessment and Plan / ED Course  I have reviewed the triage vital signs and the  nursing notes.  Pertinent labs & imaging results that were available during my care of the patient were reviewed by me and considered in my medical decision making (see chart for details).    1:18 PM d/w cardiology they will consult on patient, pacemaker has  been interrogated, awaiting results.   Cardiology has consulted on patient.  Her pacemaker interrogation was reassuring they feel she is volume depleted rather than overloaded.  She has had some improvement after albuterol with her cough and chest congestion however she still is somewhat tachypneic.  Cardiology recommends observation due to her respiratory illness.  We will also give small bolus of IV fluids.  Discussed with hospitalist team who will admit patient.  Final Clinical Impressions(s) / ED Diagnoses   Final diagnoses:  Cough  Respiratory distress  Syncope, unspecified syncope type    ED Discharge Orders    None       Pixie Casino, MD 10/06/17 [phone]

## 2017-10-06 NOTE — ED Notes (Signed)
Respiratory at bedside.

## 2017-10-06 NOTE — ED Notes (Signed)
EDP advises the pt can eat.

## 2017-10-06 NOTE — ED Triage Notes (Signed)
Pt to ER for evaluation of syncopal episode with unwitnessed fall. Hypotension noted in triage, 75/46. States has had a cough for a week with progressively worsening shortness of breath. Does not normally require O2 at home, on RA here 90%, placed on 2L nasal cannula. States takes midodrine 3x a day. Pt a/o x4. tachypnic at triage.

## 2017-10-06 NOTE — ED Notes (Signed)
Pacemaker interrogated. 

## 2017-10-06 NOTE — Consult Note (Signed)
Cardiology Consultation:   Patient ID: [name]; [id number]; April 19, 1926   Admit date: 10/06/2017 Date of Consult: 10/06/2017  Primary Care Provider: [name], MD Primary Cardiologist: Dr. Radford Pax Primary Electrophysiologist: Dr. Lovena Le   Patient Profile:   [name] is a 83 y.o. female with a hx of permanent AFib, CHB, CRI, known orthostatic issues on Midodrine, VHD w/MV repair in 2003, known NSVT, chronic CHF (systolic/diastolic) with improved EF, CBP w/spinal stenosis, cirrhosis, p.HTN who is being seen today for the evaluation of syncope at the request of Dr. Marcha Dutton  History of Present Illness:   [name] this morning had gotten up this AM to go to the bathroom, when she was heading back to the sofa she felt like he vision was getting dark and fainted, states she woke as she hit the floor.  She remained weak and  Had trouble getting herself up to the sofa.  She laid down/slept for a couple hours got up to make some breakfast, was standing at the counter, and again, vision started to fade and fainted again for a "split second.  Was unable to get herself up, up and crawled to the sofa though states that this would be difficult for her on any given day with severe back pain, not so much that she was unusually physically weak.  Denies any particular trauma no  Injury or pain anywhere.  For the last week she has been struggling with a "terrible cough", not productive, no fever or chills, no over SOB outside of the cough, but coughing makes her winded.  No CP, palpitations.  She has been using OTC remedies like Dayquil.   Has not been eating/drinking as usual with her cold in the last week, but not significantly less.  Got a treatment here and has "loosened everything up"  Orthostatic BP Supine 93/42 Sitting 83/50 (asymptomatic) Standing 97/76 (asymptomatic)  LABS K+ 3.7 BUN/Creat 36/2.07 BNP 149 poc Trop 0.02 WBC 7.4 H/H 13/42 Plts 137    Device information: MDT  CRT-D (programmed VVI), RV/RA lead implanted 2003, LV lead 2007, last gen change 2016 Device interrogation:  Battery and lead measurements are good, NSVT episodes only, not new for the patient, longest 10 seconds occurred 09/19/17, no V arrhythmias since 10/02/17, Optivol looks good, 98% BiVe pacing  Past Medical History:  Diagnosis Date  . Anemia   . Brady-tachy syndrome ([place])    a. 2003: post-op afib after MR repair then symptomatic bradycardia requiring pacemaker. b. Upgrade to Medtronic Bi-V Pacemaker 2007.  . Cellulitis and abscess of foot 08/15/2015   RT FOOT  . Chronic atrial fibrillation ([place])    a. Previously failed DCCV/amio/tikosyn.  CHADS2VASC score 5 - not on anticoagulation due to history of GI bleeding.    . Chronic combined systolic and diastolic CHF (congestive heart failure) (HCC)    a. EF 35-40% on echo 09/2013 b. echo 04/2016: EF 50-55% with mild to moderate MS.  . Cirrhosis ([name])    a. Newly recognized 09/2013 - by CT.  . CKD (chronic kidney disease)   . GI bleeding   . Hypertension   . Mitral valve disorder    a. Severe MR s/p repair 2003 (28 mm annuloplasty ring and and oversew of LAA). No CAD by cath at that time.  [name] NSVT (nonsustained ventricular tachycardia) ([name])    a. Isolated event during 09/2007 adm.  . Orthostatic hypotension dysautonomic syndrome ([place])   . Pulmonary hypertension (HCC)    Group II secondary to  CHF and MV disease  . Spinal stenosis    shes recieved epidural steroid injections in the past  . Tricuspid regurgitation     Past Surgical History:  Procedure Laterality Date  . APPENDECTOMY    . BIV PACEMAKER GENERATOR CHANGE OUT N/A 12/13/2014   Procedure: BIV PACEMAKER GENERATOR CHANGE OUT;  Surgeon: Evans Lance, MD;  [facility] CATH LAB;  Service: Cardiovascular;  Laterality: N/A;  . CHOLECYSTECTOMY    . IRRIGATION AND DEBRIDEMENT ABSCESS N/A 10/14/2013   Procedure: IRRIGATION AND DEBRIDEMENT PERINEAL ABSCESS;  Surgeon: Rolm Bookbinder, MD;  Location: [facility];  Service: General;  Laterality: N/A;  . VALVE REPLACEMENT     sever mitral regurgitation s/p mitral valve annuloplasty ring       Inpatient Medications: Scheduled Meds:  Continuous Infusions:  PRN Meds:   Allergies:   No Known Allergies  Social History:   Social History   Socioeconomic History  . Marital status: Single    Spouse name: Not on file  . Number of children: Not on file  . Years of education: Not on file  . Highest education level: Not on file  Social Needs  . Financial resource strain: Not on file  . Food insecurity - worry: Not on file  . Food insecurity - inability: Not on file  . Transportation needs - medical: Not on file  . Transportation needs - non-medical: Not on file  Occupational History  . Not on file  Tobacco Use  . Smoking status: Former Smoker    Last attempt to quit: 10/11/1971    Years since quitting: 46.0  . Smokeless tobacco: Never Used  Substance and Sexual Activity  . Alcohol use: Yes    Alcohol/week: 0.0 oz    Comment: rare  . Drug use: No  . Sexual activity: Not on file  Other Topics Concern  . Not on file  Social History Narrative  . Not on file    Family History:    Family History  Problem Relation Age of Onset  . Pancreatic cancer Father   . Other Mother        Mother died at 47 with no real medical problems     ROS:  Please see the history of present illness.  ROS  All other ROS reviewed and negative.     Physical Exam/Data:   Vitals:   10/06/17 1230 10/06/17 1245 10/06/17 1330 10/06/17 1345  BP: (!) 92/45 (!) 96/49 (!) 93/40 (!) 91/40  Pulse: 78 74 71 69  Resp: (!) 23 20 (!) 22 20  Temp:      TempSrc:      SpO2: 94% 94% 90% 95%   No intake or output data in the 24 hours ending 10/06/17 1426 There were no vitals filed for this visit. There is no height or weight on file to calculate BMI.  General:  Well nourished, well developed, in no acute distress HEENT:  normal Lymph: no adenopathy Neck: no JVD Endocrine:  No thryomegaly Vascular: No carotid bruits  Cardiac: RRR; 2/6 SM, no gallops or rubs Lungs:  Coarse, bronchial BS b/l, clears only slightly with cough, soft exp wheezes Abd: soft, nontender  Ext: no edema Musculoskeletal:  No deformities, BUE and BLE strength normal and equal Skin: warm and dry  Neuro:  No gross focal abnormalities noted Psych:  Normal affect   EKG:  The EKG was personally reviewed and demonstrates:   AFib, V paced Telemetry:  Telemetry was personally reviewed and  demonstrates:   AFib, V paced, occ PVCs, couplets  Relevant CV Studies:  05/16/16: TTE Study Conclusions - Left ventricle: The cavity size was normal. Wall thickness was   normal. Systolic function was normal. The estimated ejection   fraction was in the range of 50% to 55%. - Aortic valve: There was mild regurgitation. - Mitral valve: Prior procedures included surgical repair. The   findings are consistent with mild to moderate stenosis. Valve   area by pressure half-time: 2.1 cm^2. - Left atrium: The atrium was mildly dilated. - Right ventricle: The cavity size was mildly dilated. - Tricuspid valve: There was moderate regurgitation.  Laboratory Data:  Chemistry Recent Labs  Lab 10/06/17 1011  NA 145  K 3.7  CL 108  CO2 24  GLUCOSE 101*  BUN 36*  CREATININE 2.07*  CALCIUM 8.4*  GFRNONAA 20*  GFRAA 23*  ANIONGAP 13    No results for input(s): PROT, ALBUMIN, AST, ALT, ALKPHOS, BILITOT in the last 168 hours. Hematology Recent Labs  Lab 10/06/17 1011  WBC 7.4  RBC 4.58  HGB 13.4  HCT 42.4  MCV 92.6  MCH 29.3  MCHC 31.6  RDW 14.2  PLT 137*   Cardiac EnzymesNo results for input(s): TROPONINI in the last 168 hours.  Recent Labs  Lab 10/06/17 1014  TROPIPOC 0.02    BNP Recent Labs  Lab 10/06/17 1011  BNP 149.6*    DDimer No results for input(s): DDIMER in the last 168 hours.  Radiology/Studies:   Dg Chest 2  View Result Date: 10/06/2017 CLINICAL DATA:  Syncope EXAM: CHEST  2 VIEW COMPARISON:  05/16/2016 FINDINGS: Left subclavian pacemaker device and leads are stable. Postoperative changes from mitral replacement. Cardiomegaly. Normal vascularity. No pneumothorax or pleural effusion. Osteopenia. Stable thoracic spine. IMPRESSION: Cardiomegaly without decompensation. Electronically Signed   By: Marybelle Killings M.D.   On: 10/06/2017 11:00    Assessment and Plan:   1. Syncope, hypotension 2. Known hx of orthostasis, hypotension 75/46 initial BP      + orthostatic today but recovered, and asymptomatic here       no 3 minute standing BP      No syncope in a couple years, most likely provoked by current illness      Acute/chronic renal insufficiency, Creat 2.0 today, likely sonmewhat volume contracted       Encouraged support stockings, life style modifications, adequate hydration especially with illnesses  3. Cough     Afebrile, normal WBC     optivol and CXR do not suggest fluid OL no SOB outside of coughing spells     Acute respiratory illness, deferred to ER MD  4. Permanent Afib     CHA2DS2Vasc is at least 4, not on a/c 2/2 GIB  5. CHB w/CRT-D 6. Known NSVT     Intact device function     No findings/arrhythmias to explain syncope    I do not anticipate a cardiac admission Dr. Lovena Le to see            For questions or updates, please contact [facility] Please consult [email or web address] for contact info under Cardiology/STEMI.   Signed, Baldwin [place], PA-C  10/06/2017 2:26 PM  EP Attending  Patient seen and examined. Agree with above. The patient presents to the ER today for evaluation of syncope/fall. She has been ill for a week. She notes that she has felt bad with a non-productive cough. Denies fever or chills. Her appetite has been down some. She awoke  this morning and went to bathroom and then fell to the floor. She is not sure if she passed out. She laid down and after an  hour or so got up again and went to kitchen and again went down. She ultimately called the paramedics and presents for additional evaluation. She was noted to be hypotensive and her creatinine has gone from 1.4 to 2. Normal WBC and no obvious fever. No loss of continence and no tongue biting. She had her biv PPM interogated which demonstrates normal device function and no VT in last 48 hours. Tele demonstrated ventricular pacing. Exam reveals an elderly appearing woman. Her lungs demonstrate egophony in the left base. A/P 1. Syncope/falls/hypotension - I strongly suspect that the patient has had a relative reduced oral intake followed by hypotension from lying supine and then getting up triggering orthostasis. She denies not taking her midodrine. I would suggest gentle hydration.  2. Cough with egophony - while she does not have evidence of sepsis, her blood pressure was low and she has very abnormal lung exam. I will defer decision to give anti-biotics to her primary team. 3. Chronic systolic heart failure - she has normal optivol and cxr demonstrates normal vascularity.  4. PPM - her biv PM has been interogated and appears to be working normally. No other recs at this time.  Mikle Bosworth.D.

## 2017-10-06 NOTE — ED Notes (Signed)
Patient transported to X-ray 

## 2017-10-07 DIAGNOSIS — I482 Chronic atrial fibrillation: Secondary | ICD-10-CM

## 2017-10-07 DIAGNOSIS — J101 Influenza due to other identified influenza virus with other respiratory manifestations: Principal | ICD-10-CM

## 2017-10-07 DIAGNOSIS — N179 Acute kidney failure, unspecified: Secondary | ICD-10-CM

## 2017-10-07 DIAGNOSIS — N183 Chronic kidney disease, stage 3 (moderate): Secondary | ICD-10-CM

## 2017-10-07 DIAGNOSIS — R0603 Acute respiratory distress: Secondary | ICD-10-CM

## 2017-10-07 DIAGNOSIS — I5042 Chronic combined systolic (congestive) and diastolic (congestive) heart failure: Secondary | ICD-10-CM

## 2017-10-07 DIAGNOSIS — R55 Syncope and collapse: Secondary | ICD-10-CM

## 2017-10-07 LAB — BASIC METABOLIC PANEL
Anion gap: 12 (ref 5–15)
BUN: 37 mg/dL — AB (ref 6–20)
CALCIUM: 7.5 mg/dL — AB (ref 8.9–10.3)
CO2: 22 mmol/L (ref 22–32)
CREATININE: 2.07 mg/dL — AB (ref 0.44–1.00)
Chloride: 107 mmol/L (ref 101–111)
GFR calc Af Amer: 23 mL/min — ABNORMAL LOW (ref >60)
GFR, EST NON AFRICAN AMERICAN: 20 mL/min — AB (ref >60)
GLUCOSE: 87 mg/dL (ref 65–99)
Potassium: 3.6 mmol/L (ref 3.5–5.1)
Sodium: 141 mmol/L (ref 135–145)

## 2017-10-07 LAB — CBC
HCT: 35.7 % — ABNORMAL LOW (ref 36.0–46.0)
HEMOGLOBIN: 11 g/dL — AB (ref 12.0–15.0)
MCH: 29.1 pg (ref 26.0–34.0)
MCHC: 30.8 g/dL (ref 30.0–36.0)
MCV: 94.4 fL (ref 78.0–100.0)
Platelets: DECREASED 10*3/uL (ref 150–400)
RBC: 3.78 MIL/uL — ABNORMAL LOW (ref 3.87–5.11)
RDW: 14.7 % (ref 11.5–15.5)
WBC: 3.9 10*3/uL — ABNORMAL LOW (ref 4.0–10.5)

## 2017-10-07 LAB — URINALYSIS, ROUTINE W REFLEX MICROSCOPIC
BILIRUBIN URINE: NEGATIVE
GLUCOSE, UA: NEGATIVE mg/dL
HGB URINE DIPSTICK: NEGATIVE
KETONES UR: NEGATIVE mg/dL
LEUKOCYTES UA: NEGATIVE
Nitrite: NEGATIVE
PH: 5 (ref 5.0–8.0)
PROTEIN: NEGATIVE mg/dL
Specific Gravity, Urine: 1.015 (ref 1.005–1.030)

## 2017-10-07 LAB — INFLUENZA PANEL BY PCR (TYPE A & B)
INFLAPCR: POSITIVE — AB
INFLBPCR: NEGATIVE

## 2017-10-07 LAB — TSH: TSH: 2.463 u[IU]/mL (ref 0.350–4.500)

## 2017-10-07 MED ORDER — AMOXICILLIN-POT CLAVULANATE 500-125 MG PO TABS
1.0000 | ORAL_TABLET | Freq: Two times a day (BID) | ORAL | Status: DC
  Administered 2017-10-07 – 2017-10-09 (×5): 500 mg via ORAL
  Filled 2017-10-07 (×7): qty 1

## 2017-10-07 MED ORDER — OSELTAMIVIR PHOSPHATE 30 MG PO CAPS
30.0000 mg | ORAL_CAPSULE | Freq: Every day | ORAL | Status: DC
  Administered 2017-10-07 – 2017-10-09 (×3): 30 mg via ORAL
  Filled 2017-10-07 (×3): qty 1

## 2017-10-07 MED ORDER — HYDROCOD POLST-CPM POLST ER 10-8 MG/5ML PO SUER
5.0000 mL | Freq: Two times a day (BID) | ORAL | Status: DC | PRN
  Administered 2017-10-07 – 2017-10-09 (×3): 5 mL via ORAL
  Filled 2017-10-07 (×3): qty 5

## 2017-10-07 NOTE — Evaluation (Signed)
Physical Therapy Evaluation Patient Details Name: [name] MRN: [id number] DOB: 08-01-1926 Today's Date: 10/07/2017   History of Present Illness  Pt and with syncope and acute respiratory distress(unknown etiology). PMH - orthostatic hypotension, tachy-brady syndrome, pacer, MV repair, chf,htn  Clinical Impression  Pt admitted with above diagnosis and presents to PT with functional limitations due to deficits listed below (See PT problem list). Pt needs skilled PT to maximize independence and safety to allow discharge to home with intermittent supervision of daughter. Expect pt to progress steadily.     Follow Up Recommendations No PT follow up;Supervision - Intermittent    Equipment Recommendations  None recommended by PT    Recommendations for Other Services       Precautions / Restrictions Precautions Precautions: Fall Restrictions Weight Bearing Restrictions: No      Mobility  Bed Mobility Overal bed mobility: Modified Independent             General bed mobility comments: Incr time  Transfers Overall transfer level: Needs assistance Equipment used: Rolling walker (2 wheeled) Transfers: Sit to/from Stand Sit to Stand: Min guard         General transfer comment: Assist for safety and equipment  Ambulation/Gait Ambulation/Gait assistance: Min guard Ambulation Distance (Feet): 20 Feet Assistive device: Rolling walker (2 wheeled) Gait Pattern/deviations: Step-through pattern;Decreased stride length;Trunk flexed Gait velocity: decr Gait velocity interpretation: Below normal speed for age/gender General Gait Details: Assist for safety and lines. Amb on 3L of O2 with SpO2 100% after amb. No c/o's of dizziness or lightheadedness  Stairs            Wheelchair Mobility    Modified Rankin (Stroke Patients Only)       Balance Overall balance assessment: Needs assistance Sitting-balance support: No upper extremity supported;Feet  supported Sitting balance-Leahy Scale: Good     Standing balance support: No upper extremity supported Standing balance-Leahy Scale: Fair                               Pertinent Vitals/Pain Pain Assessment: Faces Faces Pain Scale: Hurts little more Pain Location: back Pain Descriptors / Indicators: Aching Pain Intervention(s): Limited activity within patient's tolerance    Home Living Family/patient expects to be discharged to:: Private residence Living Arrangements: Children Available Help at Discharge: Family;Available PRN/intermittently(daughter works during the day) Type of Home: House Home Access: Stairs to enter Entrance Stairs-Rails: None Technical brewer of Steps: 1 Home Layout: Two level Home Equipment: Environmental consultant - 2 wheels;Walker - 4 wheels;Bedside commode;Shower seat;Grab bars - tub/shower;Hand held shower head;Wheelchair - manual      Prior Function Level of Independence: Independent with assistive device(s)         Comments: Uses walker or rollator when she needs to      Hand Dominance   Dominant Hand: Left    Extremity/Trunk Assessment   Upper Extremity Assessment Upper Extremity Assessment: Generalized weakness    Lower Extremity Assessment Lower Extremity Assessment: Generalized weakness       Communication   Communication: No difficulties  Cognition Arousal/Alertness: Awake/alert Behavior During Therapy: WFL for tasks assessed/performed Overall Cognitive Status: Within Functional Limits for tasks assessed                                        General Comments      Exercises  Assessment/Plan    PT Assessment Patient needs continued PT services  PT Problem List Decreased strength;Decreased activity tolerance;Decreased balance;Decreased mobility       PT Treatment Interventions DME instruction;Gait training;Functional mobility training;Therapeutic activities;Therapeutic exercise;Balance  training;Patient/family education    PT Goals (Current goals can be found in the Care Plan section)  Acute Rehab PT Goals Patient Stated Goal: return home PT Goal Formulation: With patient Time For Goal Achievement: 10/14/17 Potential to Achieve Goals: Good    Frequency Min 3X/week   Barriers to discharge        Co-evaluation               AM-PAC PT "6 Clicks" Daily Activity  Outcome Measure Difficulty turning over in bed (including adjusting bedclothes, sheets and blankets)?: A Little Difficulty moving from lying on back to sitting on the side of the bed? : A Little Difficulty sitting down on and standing up from a chair with arms (e.g., wheelchair, bedside commode, etc,.)?: Unable Help needed moving to and from a bed to chair (including a wheelchair)?: A Little Help needed walking in hospital room?: A Little Help needed climbing 3-5 steps with a railing? : A Lot 6 Click Score: 15    End of Session Equipment Utilized During Treatment: Gait belt;Oxygen Activity Tolerance: Patient tolerated treatment well Patient left: in chair;with call bell/phone within reach;with chair alarm set Nurse Communication: Mobility status PT Visit Diagnosis: Unsteadiness on feet (R26.81);Muscle weakness (generalized) (M62.81)    Time: [id number] PT Time Calculation (min) (ACUTE ONLY): 27 min   Charges:   PT Evaluation $PT Eval Moderate Complexity: 1 Mod PT Treatments $Gait Training: 8-22 mins   PT G Codes:        [facility] PT [place] 10/07/2017, 2:18 PM

## 2017-10-07 NOTE — Progress Notes (Signed)
Nutrition Brief Note  Patient identified on the Malnutrition Screening Tool (MST) Report   83 y.o. female with known a-fib, orthostatic problems requiring midodrine, VHD w/MV repair in 2003, NSVT, chronic combined CHD, HTN, presented with main concern of syncopal episode that occurred earlier in the day prior to this admission.  RD met with pt in room today. Pt reports good appetite and oral intake pta. Pt is lactose intolerant and reports that supplements give her diarrhea. Pt declines all nutrition interventions at this time. RD encouraged pt to choose good lean protein with her meals to help her meat her estimated protein needs.   Wt Readings from Last 15 Encounters:  10/06/17 203 lb 1.6 oz (92.1 kg)  04/27/17 203 lb (92.1 kg)  03/27/17 207 lb (93.9 kg)  10/15/16 197 lb 8 oz (89.6 kg)  07/23/16 210 lb 12.8 oz (95.6 kg)  06/13/16 216 lb 3.2 oz (98.1 kg)  05/26/16 207 lb (93.9 kg)  05/22/16 207 lb 8 oz (94.1 kg)  05/15/16 224 lb 12.8 oz (102 kg)  01/10/16 200 lb 6.4 oz (90.9 kg)  11/01/15 200 lb 12.8 oz (91.1 kg)  08/24/15 207 lb 3.2 oz (94 kg)  08/18/15 213 lb 8 oz (96.8 kg)  06/13/15 219 lb 12.8 oz (99.7 kg)  03/15/15 223 lb (101.2 kg)    Body mass index is 37.15 kg/m. Patient meets criteria for obesity based on current BMI. Per chart, pt is weight stable.   Current diet order is regular, patient is consuming approximately 100% of meals at this time. Labs and medications reviewed.   No nutrition interventions warranted at this time. If nutrition issues arise, please consult RD.   Koleen Distance MS, RD, LDN Pager #- [phone] After Hours Pager: [phone]

## 2017-10-07 NOTE — Progress Notes (Signed)
[facility] PROGRESS NOTE  [name] MVE:[id number] DOB: 08/08/1926 DOA: 10/06/2017 PCP: [name], MD  Interim summary and HPI 83 y.o. female with known a-fib, orthostatic problems requiring midodrine, VHD w/MV repair in 2003, NSVT, chronic combined CHD, HTN, presented with main concern of syncopal episode that occurred earlier in the day prior to this admission. Pt explains she woke up in am, was heading towards bathroom and felt lightheaded, blurry vision and she passed out. She explains that she felt somewhat out of it for few days with malaise and fatigue, cough that is mixed non productive and productive of yellow sputum. This was associated with dyspnea that was initially present with exertion but has progressed to dyspnea at rest and is now associated with wheezing. She also reports chest tightness that is mostly present with coughing spells. Pt denies any known sick contacts but feels like she caught the flu.     Assessment/Plan: 1-acute resp distress with hypoxia: due to influenza A with bronchitis -positive flu by PCR -start tamiflu, continue supportive care, PRN analgesics and IVF's -wean oxygen off to RA as tolerated -will switch antibiotics to augmentin (to minimize IV infusions in patient with hx of HF. -start flutter valve -follow clinical response  -continue PRN nebulizer  2-patient with orthostatic hypotension (chronic) -will continue midodrine -continue IVF's -follow VS  3-A, fib -CHADsVASC score 4 -no on anticoagulation due to hx of GIB -rate controlled  4-chronic combined CHF -compensated -follow daily weights and strict intake and output -continue holding diuretics for now -patient seen by cardiology service and in agreement with holding diuretics for now.  5-morbid obesity  -Body mass index is 37.15 kg/m. -low calorie diet and portion control  6-acute on CKD: stage 3 at baseline -due to dehydration and continue use of diuretics -will  continue IVF's for now -continue holding diuretics -follow intake/output -repeat BMET in am    Code Status: Full Family Communication: daughter at bedside  Disposition Plan: remains inpatient, will start tx with tamiflu, continue IVF's, switch antibiotics to PO and start flutter valve. Will    Consultants:  Cardiology   Procedures:  See below for x-ray reports   Antibiotics:  augmentin   HPI/Subjective: No fever, no CP, no abd pain. Still with coughing, feeling SOB and requiring oxygen supplementation.   Objective: Vitals:   10/07/17 2013 10/07/17 2032  BP:  (!) 107/44  Pulse:  72  Resp:  19  Temp:  98.4 F (36.9 C)  SpO2: 96% 99%    Intake/Output Summary (Last 24 hours) at 10/07/2017 2150 Last data filed at 10/07/2017 1806 Gross per 24 hour  Intake 1517.5 ml  Output 200 ml  Net 1317.5 ml   Filed Weights   10/06/17 1753  Weight: 92.1 kg (203 lb 1.6 oz)    Exam:   General:  Afebrile, no CP and no abd pain. Still SOB, wheezing and with intermittent coughing. Patient feeling weak and with general malaise.  Cardiovascular: S1 and S2, no rubs, no gallops  Respiratory: positive exp wheezing, no crackles, positive scattered rhonchi.  Abdomen: soft, NT, ND, positive BS  Musculoskeletal: Sherry edema bilaterally, no cyanosis   Data Reviewed: Basic Metabolic Panel: Recent Labs  Lab 10/06/17 1011 10/06/17 1834 10/07/17 0609  NA 145  --  141  K 3.7  --  3.6  CL 108  --  107  CO2 24  --  22  GLUCOSE 101*  --  87  BUN 36*  --  37*  CREATININE  2.07* 2.63* 2.07*  CALCIUM 8.4*  --  7.5*   CBC: Recent Labs  Lab 10/06/17 1011 10/06/17 1834 10/07/17 0609  WBC 7.4 4.0 3.9*  HGB 13.4 11.8* 11.0*  HCT 42.4 37.8 35.7*  MCV 92.6 92.9 94.4  PLT 137* 127* PLATELET CLUMPS NOTED ON SMEAR, COUNT APPEARS DECREASED   BNP (last 3 results) Recent Labs    10/06/17 1011  BNP 149.6*    Studies: Dg Chest 2 View  Result Date: 10/06/2017 CLINICAL DATA:   Syncope EXAM: CHEST  2 VIEW COMPARISON:  05/16/2016 FINDINGS: Left subclavian pacemaker device and leads are stable. Postoperative changes from mitral replacement. Cardiomegaly. Normal vascularity. No pneumothorax or pleural effusion. Osteopenia. Stable thoracic spine. IMPRESSION: Cardiomegaly without decompensation. Electronically Signed   By: Marybelle Killings M.D.   On: 10/06/2017 11:00    Scheduled Meds: . amoxicillin-clavulanate  1 tablet Oral q12n4p  . enoxaparin (LOVENOX) injection  30 mg Subcutaneous Q24H  . febuxostat  40 mg Oral Daily  . ferrous sulfate  325 mg Oral BID WC  . Influenza vac split quadrivalent PF  0.5 mL Intramuscular Tomorrow-1000  . ipratropium-albuterol  3 mL Nebulization Q6H  . loratadine  10 mg Oral Daily  . midodrine  2.5 mg Oral TID WC  . multivitamin with minerals  1 tablet Oral Daily  . multivitamin-lutein  1 capsule Oral QPC supper  . oseltamivir  30 mg Oral Daily   Continuous Infusions: . sodium chloride 75 mL/hr at 10/06/17 2033     Time spent: 25 minutes    Barton Dubois  MD [facility] Pager [phone]. If 7PM-7AM, please contact night-coverage at [email or web address], password [facility] 10/07/2017, 9:50 PM  LOS: 1 day

## 2017-10-08 DIAGNOSIS — M10072 Idiopathic gout, left ankle and foot: Secondary | ICD-10-CM

## 2017-10-08 LAB — BASIC METABOLIC PANEL
ANION GAP: 9 (ref 5–15)
BUN: 24 mg/dL — AB (ref 6–20)
CALCIUM: 7.7 mg/dL — AB (ref 8.9–10.3)
CO2: 21 mmol/L — AB (ref 22–32)
CREATININE: 1.4 mg/dL — AB (ref 0.44–1.00)
Chloride: 113 mmol/L — ABNORMAL HIGH (ref 101–111)
GFR calc Af Amer: 37 mL/min — ABNORMAL LOW (ref >60)
GFR, EST NON AFRICAN AMERICAN: 32 mL/min — AB (ref >60)
GLUCOSE: 115 mg/dL — AB (ref 65–99)
Potassium: 3.2 mmol/L — ABNORMAL LOW (ref 3.5–5.1)
Sodium: 143 mmol/L (ref 135–145)

## 2017-10-08 MED ORDER — POTASSIUM CHLORIDE CRYS ER 20 MEQ PO TBCR
40.0000 meq | EXTENDED_RELEASE_TABLET | Freq: Once | ORAL | Status: AC
  Administered 2017-10-08: 40 meq via ORAL
  Filled 2017-10-08: qty 2

## 2017-10-08 MED ORDER — INDOMETHACIN ER 75 MG PO CPCR
75.0000 mg | ORAL_CAPSULE | Freq: Once | ORAL | Status: AC
  Administered 2017-10-08: 75 mg via ORAL
  Filled 2017-10-08: qty 1

## 2017-10-08 MED ORDER — IPRATROPIUM-ALBUTEROL 0.5-2.5 (3) MG/3ML IN SOLN
3.0000 mL | Freq: Four times a day (QID) | RESPIRATORY_TRACT | Status: DC
  Administered 2017-10-08 – 2017-10-09 (×4): 3 mL via RESPIRATORY_TRACT
  Filled 2017-10-08 (×4): qty 3

## 2017-10-08 MED ORDER — PREDNISONE 50 MG PO TABS
50.0000 mg | ORAL_TABLET | Freq: Every day | ORAL | Status: DC
  Administered 2017-10-08 – 2017-10-09 (×2): 50 mg via ORAL
  Filled 2017-10-08 (×2): qty 1

## 2017-10-08 MED ORDER — INFLUENZA VAC SPLIT HIGH-DOSE 0.5 ML IM SUSY
0.5000 mL | PREFILLED_SYRINGE | INTRAMUSCULAR | Status: AC
  Administered 2017-10-09: 0.5 mL via INTRAMUSCULAR
  Filled 2017-10-08: qty 0.5

## 2017-10-08 NOTE — Progress Notes (Signed)
[facility] PROGRESS NOTE  [name] YWV:[id number] DOB: [birth year] DOA: 10/06/2017 PCP: [name], MD  Interim summary and HPI 83 y.o. female with known a-fib, orthostatic problems requiring midodrine, VHD w/MV repair in 2003, NSVT, chronic combined CHD, HTN, presented with main concern of syncopal episode that occurred earlier in the day prior to this admission. Pt explains she woke up in am, was heading towards bathroom and felt lightheaded, blurry vision and she passed out. She explains that she felt somewhat out of it for few days with malaise and fatigue, cough that is mixed non productive and productive of yellow sputum. This was associated with dyspnea that was initially present with exertion but has progressed to dyspnea at rest and is now associated with wheezing. She also reports chest tightness that is mostly present with coughing spells. Pt denies any known sick contacts but feels like she caught the flu.   Assessment/Plan: 1-acute resp distress with hypoxia: due to influenza A with bronchitis -positive flu by PCR -Continue Tamiflu, continue oral antibiotics, continue flutter valve -Given ongoing sustain wheezing will add treatment with prednisone.   -Continue to wean oxygen off to RA as tolerated -follow clinical response  -continue PRN nebulizer  2-patient with orthostatic hypotension (chronic) -will continue midodrine -continue IVF's -follow VS -Steroids intended to assist with her gout flare and pneumonitis/bronchitis process will also help keeping her blood pressure elevated.  3-A, fib -CHADsVASC score 4 -no on anticoagulation due to hx of GIB -rate has remained controlled -Continue monitoring on telemetry  4-chronic combined CHF -compensated overall -follow daily weights and strict intake and output -continue holding diuretics for another 24 hours. -patient seen by cardiology service and has no further recommendations at this moment.  5-morbid  obesity  -Body mass index is 38.15 kg/m. -low calorie diet and portion control  6-acute on CKD: stage 3 at baseline -due to dehydration and continue use of diuretics -Renal function back to baseline and patient fully hydrated -will stop IV fluids -continue holding diuretics for another 24 hours (given the need of NSAIDs for gout) -follow intake/output -repeat BMET in am  7-gout -Continue [place] -Will give 1 dose of indomethacin to assist with pain -Further treatment for this flare to be done with prednisone. -Follow response.  Code Status: Full Family Communication: daughter at bedside  Disposition Plan: remains inpatient, will continue tamiflu and p.o. Augmentin.Start flutter valve. Will follow clinical response.  Add some steroids to assist with her ankle pain and also with diffuse wheezing.   Consultants:  Cardiology   Procedures:  See below for x-ray reports   Antibiotics:  augmentin   HPI/Subjective: No fever, no chest pain, still feeling short of breath and experiencing oxygen desaturation on exertion.  With diffuse wheezing, rhonchi and productive cough.  Patient also reporting left ankle swelling and pain typically of her gout flares.  Objective: Vitals:   10/08/17 0835 10/08/17 1405  BP:    Pulse:    Resp:    Temp:    SpO2: 97% 93%    Intake/Output Summary (Last 24 hours) at 10/08/2017 1414 Last data filed at 10/08/2017 0158 Gross per 24 hour  Intake 240 ml  Output 200 ml  Net 40 ml   Filed Weights   10/06/17 1753 10/08/17 0559  Weight: 92.1 kg (203 lb 1.6 oz) 94.6 kg (208 lb 8.9 oz)    Exam:   General: No fever, still complaining of shortness of breath and intermittent coughing spells.  Patient also reported left  ankle pain and swelling typically of her gout flare.   Cardiovascular: S1 and S2, no rubs, no gallops, no JVD appreciated on exam.   Respiratory: Positive tachypnea, diffuse wheezing and rhonchi; wearing 2.5 L of oxygen  supplementation (mainly on exertion).  No crackles.  Abdomen: Soft, nontender, nondistended, positive bowel sounds.    Musculoskeletal: Left ankle swollen and tender to palpation, no erythema appreciated, trace edema bilaterally on her legs, no cyanosis or clubbing.   Data Reviewed: Basic Metabolic Panel: Recent Labs  Lab 10/06/17 1011 10/06/17 1834 10/07/17 0609 10/08/17 0833  NA 145  --  141 143  K 3.7  --  3.6 3.2*  CL 108  --  107 113*  CO2 24  --  22 21*  GLUCOSE 101*  --  87 115*  BUN 36*  --  37* 24*  CREATININE 2.07* 2.63* 2.07* 1.40*  CALCIUM 8.4*  --  7.5* 7.7*   CBC: Recent Labs  Lab 10/06/17 1011 10/06/17 1834 10/07/17 0609  WBC 7.4 4.0 3.9*  HGB 13.4 11.8* 11.0*  HCT 42.4 37.8 35.7*  MCV 92.6 92.9 94.4  PLT 137* 127* PLATELET CLUMPS NOTED ON SMEAR, COUNT APPEARS DECREASED   BNP (last 3 results) Recent Labs    10/06/17 1011  BNP 149.6*    Studies: No results found.  Scheduled Meds: . amoxicillin-clavulanate  1 tablet Oral q12n4p  . enoxaparin (LOVENOX) injection  30 mg Subcutaneous Q24H  . febuxostat  40 mg Oral Daily  . ferrous sulfate  325 mg Oral BID WC  . indomethacin  75 mg Oral Once  . [START ON 10/09/2017] Influenza vac split quadrivalent PF  0.5 mL Intramuscular Tomorrow-1000  . ipratropium-albuterol  3 mL Nebulization Q6H  . loratadine  10 mg Oral Daily  . midodrine  2.5 mg Oral TID WC  . multivitamin with minerals  1 tablet Oral Daily  . multivitamin-lutein  1 capsule Oral QPC supper  . oseltamivir  30 mg Oral Daily  . potassium chloride  40 mEq Oral Once  . predniSONE  50 mg Oral Q breakfast   Continuous Infusions:    Time spent: 25 minutes    Barton Dubois  MD [facility] Pager [phone]. If 7PM-7AM, please contact night-coverage at [email or web address], password [facility] 10/08/2017, 2:14 PM  LOS: 2 days

## 2017-10-09 DIAGNOSIS — I5032 Chronic diastolic (congestive) heart failure: Secondary | ICD-10-CM

## 2017-10-09 DIAGNOSIS — J101 Influenza due to other identified influenza virus with other respiratory manifestations: Secondary | ICD-10-CM

## 2017-10-09 DIAGNOSIS — J471 Bronchiectasis with (acute) exacerbation: Secondary | ICD-10-CM

## 2017-10-09 MED ORDER — IPRATROPIUM-ALBUTEROL 20-100 MCG/ACT IN AERS
1.0000 | INHALATION_SPRAY | Freq: Four times a day (QID) | RESPIRATORY_TRACT | 0 refills | Status: DC | PRN
Start: 2017-10-09 — End: 2018-03-16

## 2017-10-09 MED ORDER — TORSEMIDE 20 MG PO TABS
20.0000 mg | ORAL_TABLET | Freq: Two times a day (BID) | ORAL | Status: DC
  Administered 2017-10-09: 20 mg via ORAL
  Filled 2017-10-09: qty 1

## 2017-10-09 MED ORDER — COLCHICINE 0.6 MG PO TABS: 0.3000 mg | ORAL_TABLET | Freq: Every day | ORAL | Status: DC

## 2017-10-09 MED ORDER — AMOXICILLIN-POT CLAVULANATE 500-125 MG PO TABS: 1.0000 | ORAL_TABLET | Freq: Two times a day (BID) | ORAL | 0 refills | Status: DC

## 2017-10-09 MED ORDER — PREDNISONE 10 MG PO TABS: ORAL_TABLET | ORAL | 0 refills | Status: DC

## 2017-10-09 MED ORDER — OSELTAMIVIR PHOSPHATE 30 MG PO CAPS: 30.0000 mg | ORAL_CAPSULE | Freq: Every day | ORAL | 0 refills | Status: DC

## 2017-10-09 NOTE — Progress Notes (Signed)
[name] to be D/C'd Home per MD order.  Discussed with the patient and all questions fully answered.  VSS, Skin clean, dry and intact without evidence of skin break down, no evidence of skin tears noted. IV catheter discontinued intact. Site without signs and symptoms of complications. Dressing and pressure applied.  An After Visit Summary was printed and given to the patient. Patient received prescription.  D/c education completed with patient/family including follow up instructions, medication list, d/c activities limitations if indicated, with other d/c instructions as indicated by MD - patient able to verbalize understanding, all questions fully answered.   Patient instructed to return to ED, call 911, or call MD for any changes in condition.   Patient escorted via [place], and D/C home via private auto. Allergies as of 10/09/2017   No Known Allergies     Medication List    TAKE these medications   acetaminophen 500 MG tablet Commonly known as:  TYLENOL Take 1,000 mg by mouth every 6 (six) hours as needed (pain).   amoxicillin-clavulanate 500-125 MG tablet Commonly known as:  AUGMENTIN Take 1 tablet (500 mg total) by mouth 2 times daily at 12 noon and 4 pm.   beta carotene w/minerals tablet Take 1 tablet by mouth daily after supper.   cetirizine 10 MG tablet Commonly known as:  ZYRTEC Take 10 mg by mouth daily.   colchicine 0.6 MG tablet Take 0.5 tablets (0.3 mg total) by mouth daily. What changed:  how much to take   ferrous sulfate 325 (65 FE) MG tablet Take 1 tablet (325 mg total) by mouth 2 (two) times daily with a meal.   Ipratropium-Albuterol 20-100 MCG/ACT Aers respimat Commonly known as:  COMBIVENT RESPIMAT Inhale 1 puff into the lungs every 6 (six) hours as needed for wheezing or shortness of breath.   midodrine 2.5 MG tablet Commonly known as:  PROAMATINE Take 1 tablet (2.5 mg total) by mouth 3 (three) times daily with meals.   multivitamin with  minerals Tabs tablet Take 1 tablet by mouth daily.   OPTIVE 0.5-0.9 % ophthalmic solution Generic drug:  carboxymethylcellul-glycerin Place 1 drop into both eyes daily as needed for dry eyes.   oseltamivir 30 MG capsule Commonly known as:  TAMIFLU Take 1 capsule (30 mg total) by mouth daily. Start taking on:  10/10/2017   potassium chloride SA 20 MEQ tablet Commonly known as:  K-DUR,KLOR-CON TAKE 1 TABLET BY MOUTH TWO  TIMES DAILY   predniSONE 10 MG tablet Commonly known as:  DELTASONE Take 4 tablets by mouth daily times 1 day, then 3 tablets by mouth daily times 2 days, then 2 tablets by mouth daily times 2-day then 1 tablet by mouth daily times 3 days and stop prednisone. Start taking on:  10/10/2017   torsemide 20 MG tablet Commonly known as:  DEMADEX TAKE 1 TABLET BY MOUTH 2  TIMES DAILY. TAKE EXTRA  TABLET IF WEIGHT GAIN  GREATER THAN 3 LBS IN ONE  DAY   ULORIC 80 MG Tabs Generic drug:  Febuxostat Take 80 mg by mouth daily.      Betha Loa Polo Mcmartin 10/09/2017 7:01 PM

## 2017-10-09 NOTE — Discharge Summary (Signed)
Physician Discharge Summary  [name] YPP:[id number] DOB: [birth year] DOA: 10/06/2017  PCP: [name], MD  Admit date: 10/06/2017 Discharge date: 10/09/2017  Time spent: 35 minutes  Recommendations for Outpatient Follow-up:  1. Repeat basic metabolic panel to follow electrolytes and renal function. 2. Please follow resolution of patient's respiratory symptoms associated with bronchitis and influenza. 3. Reassess patient Foley status and further adjust antihypertensives and diuretics if needed.   Discharge Diagnoses:  Active Problems:   Chronic diastolic CHF (congestive heart failure) (HCC)   Respiratory distress   Influenza A   Bronchiectasis with acute exacerbation (HCC) Gout flare Chronic atrial fibrillation; paroxysmal  Discharge Condition: Stable and improved.  Patient discharged home with instruction to follow-up with PCP in 10 days.  Diet recommendation: Heart healthy diet  Filed Weights   10/06/17 1753 10/08/17 0559 10/09/17 0441  Weight: 92.1 kg (203 lb 1.6 oz) 94.6 kg (208 lb 8.9 oz) 95.3 kg (210 lb)    History of present illness:  83 y.o.femalewithknown a-fib, orthostatic problems requiring midodrine, VHD w/MV repair in 2003, NSVT, chronic combined CHD, HTN, presented with main concern of syncopal episode that occurred earlier in the day prior to this admission. Pt explains she woke up in am, was heading towards bathroom and felt lightheaded, blurry vision and she passed out. She explains that she felt somewhat out of it for few days with malaise and fatigue, cough that is mixed non productive and productive of yellow sputum. This was associated with dyspnea that was initially present with exertion but has progressed to dyspnea at rest and is now associated with wheezing. She also reports chest tightness that is mostly present with coughing spells. Pt denies any known sick contacts but feels like she caught the flu.  Hospital Course:  1-acute resp distress  with hypoxia: due to influenza A with bronchitis/bronchiectasis -positive flu A by PCR -Continue Tamiflu to complete 5 days total (3 more tablets pending at discharge), continue oral antibiotics for her bronchitis, continue flutter valve and the use of Combivent as needed. -Patient instructed how to complete steroids tapering -No oxygen supplementation needed at discharge. -Advised to follow-up with her PCP in 10 days.  2-patient with orthostatic hypotension (chronic) -will continue midodrine -Steroids intended to assist with her gout flare and pneumonitis/bronchitis process will also help keeping her blood pressure elevated. -Blood pressure within normal range at discharge.  3-chronic A, fib: Paroxysmal -CHADsVASC score 4 -no on anticoagulation due to hx of GIB -rate has remained controlled  4-chronic combined CHF -compensated overall -Patient educated about daily weights and low-sodium diet.   -Will resume Demadex at discharge -patient seen by cardiology service and has no further recommendations at this moment.  5-morbid obesity  -Body mass index is 38.15 kg/m. -low calorie diet and portion control  6-acute on CKD: stage 3 at baseline -due to dehydration and continue use of diuretics -Renal function back to baseline and patient fully hydrated -Will resume diuretics -Patient instructed to keep herself well-hydrated -Recommend basic metabolic panel at follow-up visit to check electrolytes and renal function trend.  7-gout -Continue ULoric -prednisone tapering -Resume colchicine     Procedures:  See below for x-ray reports  Consultations:  Cardiology  Discharge Exam: Vitals:   10/09/17 1418 10/09/17 1444  BP: (!) 151/63 (!) 146/68  Pulse: 73   Resp:    Temp: 98.3 F (36.8 C)   SpO2: 95%      General:  Afebrile, feeling hollow better.  Reports some intermittent coughing spells  but no significant shortness of breath at rest or on exertion.  She is  with good oxygen saturation on room air.  Patient also reports significant improvement on her left ankle is able to bear weight without problems.  Cardiovascular: S1 and S2, no rubs, no gallops, no JVD appreciated on exam.   Respiratory:  Improved air movement, very mild expiratory wheezing, scattered rhonchi, no using accessory muscles, normal respiratory effort.  Abdomen: Soft, nontender, nondistended, positive bowel sounds.    Musculoskeletal:  Bated little discomfort to palpation on her left ankle, no redness, no swelling or warm sensation.  There is no edema on her leg or right lower extremity, no cyanosis, no clubbing.     Discharge Instructions   Discharge Instructions    (HEART FAILURE PATIENTS) Call MD:  Anytime you have any of the following symptoms: 1) 3 pound weight gain in 24 hours or 5 pounds in 1 week 2) shortness of breath, with or without a dry hacking cough 3) swelling in the hands, feet or stomach 4) if you have to sleep on extra pillows at night in order to breathe.   Complete by:  As directed    Diet - low sodium heart healthy   Complete by:  As directed    Discharge instructions   Complete by:  As directed    Take medications as prescribed Maintain yourself adequately hydrated Arrange follow-up with PCP in 10 days     Current Discharge Medication List    START taking these medications   Details  amoxicillin-clavulanate (AUGMENTIN) 500-125 MG tablet Take 1 tablet (500 mg total) by mouth 2 times daily at 12 noon and 4 pm. Qty: 12 tablet, Refills: 0    Ipratropium-Albuterol (COMBIVENT RESPIMAT) 20-100 MCG/ACT AERS respimat Inhale 1 puff into the lungs every 6 (six) hours as needed for wheezing or shortness of breath. Qty: 1 Inhaler, Refills: 0    oseltamivir (TAMIFLU) 30 MG capsule Take 1 capsule (30 mg total) by mouth daily. Qty: 3 capsule, Refills: 0    predniSONE (DELTASONE) 10 MG tablet Take 4 tablets by mouth daily times 1 day, then 3 tablets by mouth  daily times 2 days, then 2 tablets by mouth daily times 2-day then 1 tablet by mouth daily times 3 days and stop prednisone. Qty: 18 tablet, Refills: 0      CONTINUE these medications which have CHANGED   Details  colchicine 0.6 MG tablet Take 0.5 tablets (0.3 mg total) by mouth daily.      CONTINUE these medications which have NOT CHANGED   Details  acetaminophen (TYLENOL) 500 MG tablet Take 1,000 mg by mouth every 6 (six) hours as needed (pain).    !! beta carotene w/minerals (OCUVITE) tablet Take 1 tablet by mouth daily after supper.     carboxymethylcellul-glycerin (OPTIVE) 0.5-0.9 % ophthalmic solution Place 1 drop into both eyes daily as needed for dry eyes.    cetirizine (ZYRTEC) 10 MG tablet Take 10 mg by mouth daily.     Febuxostat (ULORIC) 80 MG TABS Take 80 mg by mouth daily.    ferrous sulfate 325 (65 FE) MG tablet Take 1 tablet (325 mg total) by mouth 2 (two) times daily with a meal. Qty: 60 tablet, Refills: 6    midodrine (PROAMATINE) 2.5 MG tablet Take 1 tablet (2.5 mg total) by mouth 3 (three) times daily with meals. Qty: 270 tablet, Refills: 3    !! Multiple Vitamin (MULTIVITAMIN WITH MINERALS) TABS tablet Take 1 tablet by  mouth daily.    potassium chloride SA (K-DUR,KLOR-CON) 20 MEQ tablet TAKE 1 TABLET BY MOUTH TWO  TIMES DAILY Qty: 180 tablet, Refills: 1    torsemide (DEMADEX) 20 MG tablet TAKE 1 TABLET BY MOUTH 2  TIMES DAILY. TAKE EXTRA  TABLET IF WEIGHT GAIN  GREATER THAN 3 LBS IN ONE  DAY Qty: 270 tablet, Refills: 1     !! - Potential duplicate medications found. Please discuss with provider.     No Known Allergies   The results of significant diagnostics from this hospitalization (including imaging, microbiology, ancillary and laboratory) are listed below for reference.    Significant Diagnostic Studies: Dg Chest 2 View  Result Date: 10/06/2017 CLINICAL DATA:  Syncope EXAM: CHEST  2 VIEW COMPARISON:  05/16/2016 FINDINGS: Left subclavian  pacemaker device and leads are stable. Postoperative changes from mitral replacement. Cardiomegaly. Normal vascularity. No pneumothorax or pleural effusion. Osteopenia. Stable thoracic spine. IMPRESSION: Cardiomegaly without decompensation. Electronically Signed   By: Marybelle Killings M.D.   On: 10/06/2017 11:00   Labs: Basic Metabolic Panel: Recent Labs  Lab 10/06/17 1011 10/06/17 1834 10/07/17 0609 10/08/17 0833  NA 145  --  141 143  K 3.7  --  3.6 3.2*  CL 108  --  107 113*  CO2 24  --  22 21*  GLUCOSE 101*  --  87 115*  BUN 36*  --  37* 24*  CREATININE 2.07* 2.63* 2.07* 1.40*  CALCIUM 8.4*  --  7.5* 7.7*   CBC: Recent Labs  Lab 10/06/17 1011 10/06/17 1834 10/07/17 0609  WBC 7.4 4.0 3.9*  HGB 13.4 11.8* 11.0*  HCT 42.4 37.8 35.7*  MCV 92.6 92.9 94.4  PLT 137* 127* PLATELET CLUMPS NOTED ON SMEAR, COUNT APPEARS DECREASED   BNP (last 3 results) Recent Labs    10/06/17 1011  BNP 149.6*    Signed:  Barton Dubois MD.  [facility] 10/09/2017, 3:00 PM

## 2017-10-09 NOTE — Progress Notes (Signed)
Physical Therapy Treatment Patient Details Name: [name] MRN: [id number] DOB: [birth year] Today's Date: 10/09/2017    History of Present Illness Pt and with syncope and acute respiratory distress(unknown etiology). PMH - orthostatic hypotension, tachy-brady syndrome, pacer, MV repair, chf,htn    PT Comments    Patient progressing with mobility, requiring supervision for ambulation at this time.  Patient still limited by shortness of breath, however, O2 sat above 95%.  Feel patient safe for d/c at this time from PT standpoint with intermittent supervision at home.  Would benefit from F/U PT to progress mobility and balance.    Follow Up Recommendations  No PT follow up;Supervision - Intermittent     Equipment Recommendations  None recommended by PT    Recommendations for Other Services       Precautions / Restrictions Precautions Precautions: Fall    Mobility  Bed Mobility Overal bed mobility: Modified Independent             General bed mobility comments: used railing  Transfers Overall transfer level: Needs assistance Equipment used: Rolling walker (2 wheeled) Transfers: Sit to/from Stand Sit to Stand: Supervision         General transfer comment: supervision for safety  Ambulation/Gait Ambulation/Gait assistance: Supervision Ambulation Distance (Feet): 60 Feet Assistive device: Rolling walker (2 wheeled) Gait Pattern/deviations: Step-through pattern;Decreased stride length;Trunk flexed Gait velocity: decr   General Gait Details: patient c/o back pain during gait and returned to room; ambulated on room air and checked o2 sats mid-way and at end of ambulation   Stairs            Wheelchair Mobility    Modified Rankin (Stroke Patients Only)       Balance Overall balance assessment: Needs assistance Sitting-balance support: No upper extremity supported;Feet supported Sitting balance-Leahy Scale: Good     Standing balance support:  Bilateral upper extremity supported;During functional activity Standing balance-Leahy Scale: Poor Standing balance comment: reliant on RW for balance                            Cognition Arousal/Alertness: Awake/alert Behavior During Therapy: WFL for tasks assessed/performed Overall Cognitive Status: Within Functional Limits for tasks assessed                                        Exercises      General Comments        Pertinent Vitals/Pain Faces Pain Scale: Hurts little more Pain Location: back Pain Descriptors / Indicators: Aching Pain Intervention(s): Limited activity within patient's tolerance;Monitored during session    Home Living                      Prior Function            PT Goals (current goals can now be found in the care plan section) Progress towards PT goals: Progressing toward goals    Frequency    Min 3X/week      PT Plan Current plan remains appropriate    Co-evaluation              AM-PAC PT "6 Clicks" Daily Activity  Outcome Measure  Difficulty turning over in bed (including adjusting bedclothes, sheets and blankets)?: A Little Difficulty moving from lying on back to sitting on the side of the bed? : A Little Difficulty  sitting down on and standing up from a chair with arms (e.g., wheelchair, bedside commode, etc,.)?: A Little Help needed moving to and from a bed to chair (including a wheelchair)?: A Little Help needed walking in hospital room?: A Little Help needed climbing 3-5 steps with a railing? : A Little 6 Click Score: 18    End of Session Equipment Utilized During Treatment: Gait belt Activity Tolerance: Patient tolerated treatment well Patient left: in bed;with call bell/phone within reach;with bed alarm set Nurse Communication: Mobility status;Other (comment)(o2 sats) PT Visit Diagnosis: Unsteadiness on feet (R26.81);Muscle weakness (generalized) (M62.81)     Time: 2025-4270 PT  Time Calculation (min) (ACUTE ONLY): 18 min  Charges:  $Gait Training: 8-22 mins                    G Codes:       Oct 18, 2017 [place], PT [phone]     Shanna Cisco 10-18-2017, 3:57 PM

## 2017-10-09 NOTE — Care Management Important Message (Signed)
Important Message  Patient Details  Name: [name] MRN: [id number] Date of Birth: [birth year]   Medicare Important Message Given:  Yes    Carles Collet, RN 10/09/2017, 9:36 AM

## 2017-10-09 NOTE — Care Management Note (Signed)
Case Management Note  Patient Details  Name: SHAUNAE SIELOFF MRN: [id number] Date of Birth: 03/20/1926  Subjective/Objective:                 Patient from home w assist of daughter admitted w resp distress.  PT eval no HH follow up rec, no DME rec, on RA at this time. No CM needs identified at this time. CM signing off. If needs arise please reconsult.   Action/Plan:   Expected Discharge Date:                  Expected Discharge Plan:  Home/Self Care  In-House Referral:     Discharge planning Services  CM Consult  Post Acute Care Choice:    Choice offered to:     DME Arranged:    DME Agency:     HH Arranged:    HH Agency:     Status of Service:  Completed, signed off  If discussed at [employer] of Stay Meetings, dates discussed:    Additional Comments:  Carles Collet, RN 10/09/2017, 10:41 AM

## 2017-10-26 ENCOUNTER — Ambulatory Visit (INDEPENDENT_AMBULATORY_CARE_PROVIDER_SITE_OTHER): Payer: Medicare Other | Admitting: *Deleted

## 2017-10-26 DIAGNOSIS — I482 Chronic atrial fibrillation: Secondary | ICD-10-CM | POA: Diagnosis not present

## 2017-10-26 DIAGNOSIS — I4821 Permanent atrial fibrillation: Secondary | ICD-10-CM

## 2017-10-26 NOTE — Progress Notes (Signed)
Remote pacemaker transmission.   

## 2017-10-28 ENCOUNTER — Other Ambulatory Visit: Payer: Self-pay | Admitting: Physician Assistant

## 2017-10-28 LAB — CUP PACEART REMOTE DEVICE CHECK
Battery Remaining Longevity: 66 mo
Battery Voltage: 3.01 V
Brady Statistic AS VP Percent: 0 %
Brady Statistic RA Percent Paced: 0 %
Date Time Interrogation Session: 20181210195557
Implantable Lead Implant Date: 20030908
Implantable Lead Location: 753858
Implantable Lead Location: 753859
Implantable Lead Model: 4194
Implantable Pulse Generator Implant Date: 20160127
Lead Channel Impedance Value: 304 Ohm
Lead Channel Impedance Value: 323 Ohm
Lead Channel Impedance Value: 380 Ohm
Lead Channel Impedance Value: 494 Ohm
Lead Channel Impedance Value: 494 Ohm
Lead Channel Pacing Threshold Amplitude: 0.625 V
Lead Channel Sensing Intrinsic Amplitude: 28.125 mV
Lead Channel Setting Pacing Amplitude: 1.75 V
Lead Channel Setting Pacing Pulse Width: 0.4 ms
Lead Channel Setting Pacing Pulse Width: 0.6 ms
MDC IDC LEAD IMPLANT DT: 20030908
MDC IDC LEAD IMPLANT DT: 20071114
MDC IDC LEAD LOCATION: 753860
MDC IDC MSMT LEADCHNL LV IMPEDANCE VALUE: 437 Ohm
MDC IDC MSMT LEADCHNL LV IMPEDANCE VALUE: 551 Ohm
MDC IDC MSMT LEADCHNL LV IMPEDANCE VALUE: 551 Ohm
MDC IDC MSMT LEADCHNL RA SENSING INTR AMPL: 0.25 mV
MDC IDC MSMT LEADCHNL RA SENSING INTR AMPL: 0.375 mV
MDC IDC MSMT LEADCHNL RV IMPEDANCE VALUE: 513 Ohm
MDC IDC MSMT LEADCHNL RV PACING THRESHOLD PULSEWIDTH: 0.4 ms
MDC IDC MSMT LEADCHNL RV SENSING INTR AMPL: 28.125 mV
MDC IDC SET LEADCHNL RV PACING AMPLITUDE: 2.5 V
MDC IDC SET LEADCHNL RV SENSING SENSITIVITY: 5.6 mV
MDC IDC STAT BRADY AP VP PERCENT: 0 %
MDC IDC STAT BRADY AP VS PERCENT: 0 %
MDC IDC STAT BRADY AS VS PERCENT: 0 %
MDC IDC STAT BRADY RV PERCENT PACED: 98.03 %

## 2017-10-30 ENCOUNTER — Encounter: Payer: Self-pay | Admitting: Cardiology

## 2017-11-29 NOTE — Progress Notes (Deleted)
Cardiology Office Note:    Date:  11/29/2017   ID:  PRENTISS HAMMETT, DOB Jul 23, 1926, MRN [id number]  PCP:  [name], MD  Cardiologist:  No primary care provider on file.    Referring MD: [name], MD   No chief complaint on file.   History of Present Illness:    [name] is a 84 y.o. female with a hx of with past medical history of chronic combined systolic/diastolic CHF (EF 13-24% by echo 04/2016), tachy brady syndrome (s/p BiV PPM insertion), severe MR s/p MV repair in 2003 (mild to moderate MS on echo 04/2016), Stage 3 CKD, chronic atrial fibrillation (not on anticoagulation secondary to GIB), hypotension (on midodrine),and moderate pulmonary HTN. Most recent echocardiogram, 04/2016, showed an improved EF of 50-55% with mild to moderate mitral stenosis. Due to hypotension she is unable to tolerate BB or ACE- I. She is on Midodrine.  She is here today for followup and is doing well.  She denies any chest pain or pressure,  PND, orthopnea, LE edema, dizziness, palpitations or syncope. She is compliant with her meds and is tolerating meds with no SE.  Her weight is usually around 207lbs.  She has chronic DOE which has not changed since I saw her last.    Past Medical History:  Diagnosis Date  . Anemia   . Brady-tachy syndrome ([place])    a. 2003: post-op afib after MR repair then symptomatic bradycardia requiring pacemaker. b. Upgrade to Medtronic Bi-V Pacemaker 2007.  . Cellulitis and abscess of foot 08/15/2015   RT FOOT  . Chronic atrial fibrillation ([place])    a. Previously failed DCCV/amio/tikosyn.  CHADS2VASC score 5 - not on anticoagulation due to history of GI bleeding.    . Chronic combined systolic and diastolic CHF (congestive heart failure) (HCC)    a. EF 35-40% on echo 09/2013 b. echo 04/2016: EF 50-55% with mild to moderate MS.  . Cirrhosis ([place])    a. Newly recognized 09/2013 - by CT.  . CKD (chronic kidney disease)   . GI bleeding   . Hypertension   .  Mitral valve disorder    a. Severe MR s/p repair 2003 (28 mm annuloplasty ring and and oversew of LAA). No CAD by cath at that time.  [name] NSVT (nonsustained ventricular tachycardia) ([place])    a. Isolated event during 09/2007 adm.  . Orthostatic hypotension dysautonomic syndrome ([place])   . Pulmonary hypertension (HCC)    Group II secondary to CHF and MV disease  . Spinal stenosis    shes recieved epidural steroid injections in the past  . Tricuspid regurgitation     Past Surgical History:  Procedure Laterality Date  . APPENDECTOMY    . BIV PACEMAKER GENERATOR CHANGE OUT N/A 12/13/2014   Procedure: BIV PACEMAKER GENERATOR CHANGE OUT;  Surgeon: Evans Lance, MD;  Location: [facility] CATH LAB;  Service: Cardiovascular;  Laterality: N/A;  . CHOLECYSTECTOMY    . IRRIGATION AND DEBRIDEMENT ABSCESS N/A 10/14/2013   Procedure: IRRIGATION AND DEBRIDEMENT PERINEAL ABSCESS;  Surgeon: Rolm Bookbinder, MD;  Location: [place];  Service: General;  Laterality: N/A;  . VALVE REPLACEMENT     sever mitral regurgitation s/p mitral valve annuloplasty ring    Current Medications: No outpatient medications have been marked as taking for the 11/30/17 encounter (Appointment) with Sueanne Margarita, MD.     Allergies:   Patient has no known allergies.   Social History   Socioeconomic History  . Marital status: Single  Spouse name: Not on file  . Number of children: Not on file  . Years of education: Not on file  . Highest education level: Not on file  Social Needs  . Financial resource strain: Not on file  . Food insecurity - worry: Not on file  . Food insecurity - inability: Not on file  . Transportation needs - medical: Not on file  . Transportation needs - non-medical: Not on file  Occupational History  . Not on file  Tobacco Use  . Smoking status: Former Smoker    Last attempt to quit: 10/11/1971    Years since quitting: 46.1  . Smokeless tobacco: Never Used  Substance and Sexual Activity  .  Alcohol use: Yes    Alcohol/week: 0.0 oz    Comment: rare  . Drug use: No  . Sexual activity: Not on file  Other Topics Concern  . Not on file  Social History Narrative  . Not on file     Family History: [name] family history includes Other in her mother; Pancreatic cancer in her father.  ROS:   Please see the history of present illness.    ROS  All other systems reviewed and negative.   EKGs/Labs/Other Studies Reviewed:    The following studies were reviewed today: none  EKG:  EKG is not ordered today.    Recent Labs: 10/06/2017: B Natriuretic Peptide 149.6 10/07/2017: Hemoglobin 11.0; Platelets PLATELET CLUMPS NOTED ON SMEAR, COUNT APPEARS DECREASED; TSH 2.463 10/08/2017: BUN 24; Creatinine, Ser 1.40; Potassium 3.2; Sodium 143   Recent Lipid Panel No results found for: CHOL, TRIG, HDL, CHOLHDL, VLDL, LDLCALC, LDLDIRECT  Physical Exam:    VS:  LMP 10/10/2013     Wt Readings from Last 3 Encounters:  10/09/17 210 lb (95.3 kg)  04/27/17 203 lb (92.1 kg)  03/27/17 207 lb (93.9 kg)     GEN:  Well nourished, well developed in no acute distress HEENT: Normal NECK: No JVD; No carotid bruits LYMPHATICS: No lymphadenopathy CARDIAC: RRR, no murmurs, rubs, gallops RESPIRATORY:  Clear to auscultation without rales, wheezing or rhonchi  ABDOMEN: Soft, non-tender, non-distended MUSCULOSKELETAL:  No edema; No deformity  SKIN: Warm and dry NEUROLOGIC:  Alert and oriented x 3 PSYCHIATRIC:  Normal affect   ASSESSMENT:    1. Permanent atrial fibrillation ([place])   2. Chronic diastolic CHF (congestive heart failure) ([place])   3. Essential hypertension   4. Mitral valve disorder   5. NSVT (nonsustained ventricular tachycardia) (HCC)   6. Orthostatic hypotension dysautonomic syndrome (HCC)    PLAN:    In order of problems listed above:  1.  Permanent atrial fibrillation s/p BiV PPM - pacer is followed in device clinic- she remains well rate controlled. She is not on  any rate lowering drugs. She is not on long term anticoagulation due to history of GI bleed.    2.  Chronic diastolic CHF - she appears euvolemic on exam today and she is at her baseline weight.  She will continue on Torsemide 20mg  daily.  Again, stressed the importance of daily weights, low sodium diet and fluid restriction to 2L per day.  Her K+ was low in 09/2017 so I will repeat a BMET.  3.  HTN - she has not required any antihypertensive meds.  4.  Severe MR s/p MV repair 2003 - her last echo 04/2016 showed mild to moderate MS due to patient prosthesis mismatch.    5.  NSVT - She has not had any  reoccurence.  EF 50-55% on echo 2017.  6.  Orthostatic hypotension - she has not had any symptoms of dizziness or syncope.  She will continue on current dose of Midodrine.     Medication Adjustments/Labs and Tests Ordered: Current medicines are reviewed at length with the patient today.  Concerns regarding medicines are outlined above.  No orders of the defined types were placed in this encounter.  No orders of the defined types were placed in this encounter.   Signed, [name], MD  11/29/2017 8:44 PM    [place]

## 2017-11-30 ENCOUNTER — Ambulatory Visit: Payer: Medicare Other | Admitting: Cardiology

## 2017-12-15 ENCOUNTER — Other Ambulatory Visit: Payer: Self-pay | Admitting: Internal Medicine

## 2017-12-15 ENCOUNTER — Ambulatory Visit
Admission: RE | Admit: 2017-12-15 | Discharge: 2017-12-15 | Disposition: A | Discharge status: Home / Self Care | Payer: Medicare Other | Source: Ambulatory Visit | Attending: Internal Medicine | Admitting: Internal Medicine

## 2017-12-15 DIAGNOSIS — R059 Cough, unspecified: Secondary | ICD-10-CM

## 2017-12-15 DIAGNOSIS — R05 Cough: Secondary | ICD-10-CM

## 2017-12-27 ENCOUNTER — Other Ambulatory Visit: Payer: Self-pay | Admitting: Cardiology

## 2018-01-21 ENCOUNTER — Ambulatory Visit: Payer: Medicare Other | Admitting: Cardiology

## 2018-01-25 ENCOUNTER — Ambulatory Visit (INDEPENDENT_AMBULATORY_CARE_PROVIDER_SITE_OTHER): Payer: Medicare Other | Admitting: *Deleted

## 2018-01-25 ENCOUNTER — Telehealth: Payer: Self-pay | Admitting: Cardiology

## 2018-01-25 DIAGNOSIS — I482 Chronic atrial fibrillation: Secondary | ICD-10-CM

## 2018-01-25 DIAGNOSIS — I4821 Permanent atrial fibrillation: Secondary | ICD-10-CM

## 2018-01-25 NOTE — Telephone Encounter (Signed)
LMOVM reminding pt to send remote transmission.   

## 2018-01-26 NOTE — Progress Notes (Signed)
Remote pacemaker transmission.   

## 2018-01-27 ENCOUNTER — Encounter: Payer: Self-pay | Admitting: Cardiology

## 2018-01-28 LAB — CUP PACEART REMOTE DEVICE CHECK
Battery Remaining Longevity: 57 mo
Battery Voltage: 3 V
Brady Statistic AP VP Percent: 0 %
Brady Statistic AP VS Percent: 0 %
Brady Statistic RA Percent Paced: 0 %
Brady Statistic RV Percent Paced: 98.45 %
Date Time Interrogation Session: 20190312011151
Implantable Lead Implant Date: 20030908
Implantable Lead Implant Date: 20071114
Implantable Lead Location: 753858
Implantable Lead Location: 753860
Implantable Lead Model: 5076
Implantable Lead Model: 5076
Implantable Pulse Generator Implant Date: 20160127
Lead Channel Impedance Value: 323 Ohm
Lead Channel Impedance Value: 437 Ohm
Lead Channel Impedance Value: 532 Ohm
Lead Channel Impedance Value: 589 Ohm
Lead Channel Pacing Threshold Amplitude: 0.5 V
Lead Channel Pacing Threshold Pulse Width: 0.4 ms
Lead Channel Sensing Intrinsic Amplitude: 0.25 mV
Lead Channel Sensing Intrinsic Amplitude: 0.375 mV
Lead Channel Sensing Intrinsic Amplitude: 28.125 mV
Lead Channel Setting Pacing Amplitude: 1.75 V
Lead Channel Setting Pacing Amplitude: 2.5 V
MDC IDC LEAD IMPLANT DT: 20030908
MDC IDC LEAD LOCATION: 753859
MDC IDC MSMT LEADCHNL LV IMPEDANCE VALUE: 494 Ohm
MDC IDC MSMT LEADCHNL LV IMPEDANCE VALUE: 494 Ohm
MDC IDC MSMT LEADCHNL RA IMPEDANCE VALUE: 304 Ohm
MDC IDC MSMT LEADCHNL RV IMPEDANCE VALUE: 513 Ohm
MDC IDC MSMT LEADCHNL RV IMPEDANCE VALUE: 532 Ohm
MDC IDC MSMT LEADCHNL RV SENSING INTR AMPL: 28.125 mV
MDC IDC SET LEADCHNL LV PACING PULSEWIDTH: 0.6 ms
MDC IDC SET LEADCHNL RV PACING PULSEWIDTH: 0.4 ms
MDC IDC SET LEADCHNL RV SENSING SENSITIVITY: 5.6 mV
MDC IDC STAT BRADY AS VP PERCENT: 0 %
MDC IDC STAT BRADY AS VS PERCENT: 0 %

## 2018-02-14 ENCOUNTER — Other Ambulatory Visit: Payer: Self-pay | Admitting: Cardiology

## 2018-03-16 ENCOUNTER — Ambulatory Visit: Payer: Medicare Other | Admitting: Cardiology

## 2018-03-16 ENCOUNTER — Encounter: Payer: Self-pay | Admitting: Cardiology

## 2018-03-16 VITALS — BP 128/82 | HR 79 | Ht 62.0 in | Wt 214.0 lb

## 2018-03-16 DIAGNOSIS — I5032 Chronic diastolic (congestive) heart failure: Secondary | ICD-10-CM

## 2018-03-16 DIAGNOSIS — I059 Rheumatic mitral valve disease, unspecified: Secondary | ICD-10-CM | POA: Diagnosis not present

## 2018-03-16 DIAGNOSIS — I951 Orthostatic hypotension: Secondary | ICD-10-CM | POA: Diagnosis not present

## 2018-03-16 DIAGNOSIS — I4821 Permanent atrial fibrillation: Secondary | ICD-10-CM

## 2018-03-16 DIAGNOSIS — I472 Ventricular tachycardia: Secondary | ICD-10-CM | POA: Diagnosis not present

## 2018-03-16 DIAGNOSIS — I10 Essential (primary) hypertension: Secondary | ICD-10-CM

## 2018-03-16 DIAGNOSIS — I4729 Other ventricular tachycardia: Secondary | ICD-10-CM

## 2018-03-16 DIAGNOSIS — I482 Chronic atrial fibrillation: Secondary | ICD-10-CM | POA: Diagnosis not present

## 2018-03-16 NOTE — Patient Instructions (Signed)
Medication Instructions:  Your physician recommends that you continue on your current medications as directed. Please refer to the Current Medication list given to you today.  If you need a refill on your cardiac medications, please contact your pharmacy first.  Labwork: Today for kidney function test (BMET)  Testing/Procedures: None ordered   Follow-Up: Your physician wants you to follow-up in: 6 months with Dr.Turner. You will receive a reminder letter in the mail two months in advance. If you don't receive a letter, please call our office to schedule the follow-up appointment.  Any Other Special Instructions Will Be Listed Below (If Applicable).   Thank you for choosing [facility], RN  [phone]  If you need a refill on your cardiac medications before your next appointment, please call your pharmacy.

## 2018-03-16 NOTE — Progress Notes (Signed)
Cardiology Office Note:    Date:  03/16/2018   ID:  POETRY CERRO, DOB 1926/03/02, MRN [id number]  PCP:  [name], MD  Cardiologist:  No primary care provider on file.    Referring MD: [name], MD   Chief Complaint  Patient presents with  . Congestive Heart Failure  . Mitral Regurgitation  . Atrial Fibrillation    History of Present Illness:    [name] is a 84 y.o. 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 Most recent echocardiogram, 04/2016, showed an improved EF of 50-55% with mild to moderate mitral stenosis. Due to hypotension she is unable to tolerate BB or ACE- I. She is on Midodrine.  She had an admission for syncope on 10/06/2017.  She apparently got up off the couch to go to the bathroom and when she was heading back to her couch she got tunnel vision and then passed out and awoke on the floor.  She was very weak at the time and had a hard time getting back up the sofa.  She laid down and slept for a few hours and then when she got back up had another episode of syncope.  On admission she was hypotensive and creatinine increased from 1.42.  IV pacemaker was interrogated showing normal device function and no arrhythmias.  It was felt that her syncope was related to decreased p.o. intake leading to hypotension and triggered by orthostasis when standing up.  She had also not been taking her Midrin.  OptiVol on her by V pacemaker interrogation was normal.  She is here today for followup and is doing well.  She denies any chest pain or pressure, SOB, DOE, PND, orthopnea, LE edema, dizziness, palpitations or syncope. She is compliant with her meds and is tolerating meds  with no SE.     Past Medical History:  Diagnosis Date  . Anemia   . Brady-tachy syndrome ([place])    a. 2003: post-op afib after MR repair then symptomatic bradycardia requiring pacemaker. b. Upgrade to Medtronic Bi-V Pacemaker 2007.  . Cellulitis and abscess of foot 08/15/2015   RT FOOT  . Chronic atrial fibrillation ([place])    a. Previously failed DCCV/amio/tikosyn.  CHADS2VASC score 5 - not on anticoagulation due to history of GI bleeding.    . Chronic combined systolic and diastolic CHF (congestive heart failure) (HCC)    a. EF 35-40% on echo 09/2013 b. echo 04/2016: EF 50-55% with mild to moderate MS.  . Cirrhosis ([place])    a. Newly recognized 09/2013 - by CT.  . CKD (chronic kidney disease)   . GI bleeding   . Hypertension   . Mitral valve disorder    a. Severe MR s/p repair 2003 (28 mm annuloplasty ring and and oversew of LAA). No CAD by cath at that time.  [name] NSVT (nonsustained ventricular tachycardia) ([place])    a. Isolated event during 09/2007 adm.  . Orthostatic hypotension dysautonomic syndrome ([place])   . Pulmonary hypertension (HCC)    Group II secondary to CHF and MV disease  . Spinal stenosis    shes recieved epidural steroid injections in the past  . Tricuspid regurgitation     Past Surgical History:  Procedure Laterality  Date  . APPENDECTOMY    . BIV PACEMAKER GENERATOR CHANGE OUT N/A 12/13/2014   Procedure: BIV PACEMAKER GENERATOR CHANGE OUT;  Surgeon: Evans Lance, MD;  Location: [facility] CATH LAB;  Service: Cardiovascular;  Laterality: N/A;  . CHOLECYSTECTOMY    . IRRIGATION AND DEBRIDEMENT ABSCESS N/A 10/14/2013   Procedure: IRRIGATION AND DEBRIDEMENT PERINEAL ABSCESS;  Surgeon: Rolm Bookbinder, MD;  Location: [place];  Service: General;  Laterality: N/A;  . VALVE REPLACEMENT     sever mitral regurgitation s/p mitral valve annuloplasty ring    Current Medications: Current Meds  Medication Sig  . acetaminophen (TYLENOL) 500 MG tablet Take 1,000 mg by mouth every 6  (six) hours as needed (pain).  [name] beta carotene w/minerals (OCUVITE) tablet Take 1 tablet by mouth daily after supper.   . carboxymethylcellul-glycerin (OPTIVE) 0.5-0.9 % ophthalmic solution Place 1 drop into both eyes daily as needed for dry eyes.  . cetirizine (ZYRTEC) 10 MG tablet Take 10 mg by mouth daily.   . colchicine 0.6 MG tablet Take 0.5 tablets (0.3 mg total) by mouth daily.  . ferrous sulfate 325 (65 FE) MG tablet Take 1 tablet (325 mg total) by mouth 2 (two) times daily with a meal.  . midodrine (PROAMATINE) 2.5 MG tablet TAKE 1 TABLET BY MOUTH 3  TIMES DAILY WITH MEALS  . Multiple Vitamin (MULTIVITAMIN WITH MINERALS) TABS tablet Take 1 tablet by mouth daily.  . potassium chloride SA (K-DUR,KLOR-CON) 20 MEQ tablet TAKE 1 TABLET BY MOUTH TWO  TIMES DAILY  . torsemide (DEMADEX) 20 MG tablet TAKE 1 TABLET BY MOUTH 2  TIMES DAILY. TAKE EXTRA  TABLET IF WEIGHT GAIN  GREATER THAN 3 LBS IN ONE  DAY     Allergies:   Patient has no known allergies.   Social History   Socioeconomic History  . Marital status: Single    Spouse name: Not on file  . Number of children: Not on file  . Years of education: Not on file  . Highest education level: Not on file  Occupational History  . Not on file  Social Needs  . Financial resource strain: Not on file  . Food insecurity:    Worry: Not on file    Inability: Not on file  . Transportation needs:    Medical: Not on file    Non-medical: Not on file  Tobacco Use  . Smoking status: Former Smoker    Last attempt to quit: 10/11/1971    Years since quitting: 46.4  . Smokeless tobacco: Never Used  Substance and Sexual Activity  . Alcohol use: Yes    Alcohol/week: 0.0 oz    Comment: rare  . Drug use: No  . Sexual activity: Not on file  Lifestyle  . Physical activity:    Days per week: Not on file    Minutes per session: Not on file  . Stress: Not on file  Relationships  . Social connections:    Talks on phone: Not on file    Gets  together: Not on file    Attends religious service: Not on file    Active member of club or organization: Not on file    Attends meetings of clubs or organizations: Not on file    Relationship status: Not on file  Other Topics Concern  . Not on file  Social History Narrative  . Not on file     Family History: [name] family history includes Other in her mother; Pancreatic cancer in  her father.  ROS:   Please see the history of present illness.    ROS  All other systems reviewed and negative.   EKGs/Labs/Other Studies Reviewed:    The following studies were reviewed today: none  EKG:  EKG is ordered today and showed atrial fibrillation with ventricular paced rhythm and occasional PVCs  Recent Labs: 10/06/2017: B Natriuretic Peptide 149.6 10/07/2017: Hemoglobin 11.0; Platelets PLATELET CLUMPS NOTED ON SMEAR, COUNT APPEARS DECREASED; TSH 2.463 10/08/2017: BUN 24; Creatinine, Ser 1.40; Potassium 3.2; Sodium 143   Recent Lipid Panel No results found for: CHOL, TRIG, HDL, CHOLHDL, VLDL, LDLCALC, LDLDIRECT  Physical Exam:    VS:  BP 128/82   Pulse 79   Ht 5\' 2"  (1.575 m)   Wt 214 lb (97.1 kg)   LMP 10/10/2013   SpO2 98%   BMI 39.14 kg/m     Wt Readings from Last 3 Encounters:  03/16/18 214 lb (97.1 kg)  10/09/17 210 lb (95.3 kg)  04/27/17 203 lb (92.1 kg)     GEN:  Well nourished, well developed in no acute distress HEENT: Normal NECK: No JVD; No carotid bruits LYMPHATICS: No lymphadenopathy CARDIAC: RRR, no murmurs, rubs, gallops RESPIRATORY:  Clear to auscultation without rales, wheezing or rhonchi  ABDOMEN: Soft, non-tender, non-distended MUSCULOSKELETAL:  No edema; No deformity  SKIN: Warm and dry NEUROLOGIC:  Alert and oriented x 3 PSYCHIATRIC:  Normal affect   ASSESSMENT:    1. Permanent atrial fibrillation ([place])   2. Chronic diastolic CHF (congestive heart failure) ([place])   3. Orthostatic hypotension   4. Essential hypertension   5. NSVT  (nonsustained ventricular tachycardia) (HCC)   6. Mitral valve disorder    PLAN:    In order of problems listed above:  1.  Permanent atrial fibrillation -heart rate is well controlled on exam today.  She is not anticoagulated due to history of GI bleed.  She is not on beta-blockers or calcium channel blockers due to her history of orthostatic hypotension.  2.  Chronic diastolic heart failure -she appears euvolemic on exam but weight is up 4 pounds from November from 210 to 214 pounds now. She will continue on torsemide 20 mg twice daily.  Check a Bmet today  3.  Orthostatic hypotension -had an episode of syncope back in the fall secondary to poor p.o. intake and not taking her Midodrine.  She has not had any further episodes of dizziness or syncope.  She is been compliant with her Midodrine 2.5 mg 3 times daily  4.  Nonsustained ventricular tachycardia -she has not had any recurrence.  She has normal LV function on 2D echocardiogram 05/16/2016 with EF 50 to 55% with no RWMAs.   5. Severe MR s/p MV repair in 2003.  2D echo 05/16/2016 showed mild to moderate mitral stenosis with mean mitral valve gradient 5 mmHg.  Medication Adjustments/Labs and Tests Ordered: Current medicines are reviewed at length with the patient today.  Concerns regarding medicines are outlined above.  Orders Placed This Encounter  Procedures  . EKG 12-Lead   No orders of the defined types were placed in this encounter.   Signed, [name], MD  03/16/2018 2:33 PM    [place]

## 2018-03-17 LAB — BASIC METABOLIC PANEL
BUN / CREAT RATIO: 16 (ref 12–28)
BUN: 25 mg/dL (ref 8–27)
CO2: 23 mmol/L (ref 20–29)
Calcium: 8.7 mg/dL (ref 8.7–10.3)
Chloride: 106 mmol/L (ref 96–106)
Creatinine, Ser: 1.52 mg/dL — ABNORMAL HIGH (ref 0.57–1.00)
GFR calc Af Amer: 35 mL/min/{1.73_m2} — ABNORMAL LOW (ref >59)
GFR calc non Af Amer: 30 mL/min/{1.73_m2} — ABNORMAL LOW (ref >59)
GLUCOSE: 93 mg/dL (ref 65–99)
POTASSIUM: 4.8 mmol/L (ref 3.5–5.2)
SODIUM: 146 mmol/L — AB (ref 134–144)

## 2018-04-15 ENCOUNTER — Other Ambulatory Visit: Payer: Self-pay

## 2018-04-15 MED ORDER — MIDODRINE HCL 2.5 MG PO TABS: ORAL_TABLET | ORAL | 0 refills | Status: DC

## 2018-04-26 ENCOUNTER — Encounter: Payer: Medicare Other | Admitting: *Deleted

## 2018-04-26 ENCOUNTER — Telehealth: Payer: Self-pay

## 2018-04-26 NOTE — Telephone Encounter (Signed)
LMOVM reminding pt to send remote transmission.   

## 2018-04-28 ENCOUNTER — Encounter: Payer: Self-pay | Admitting: Cardiology

## 2018-05-04 ENCOUNTER — Ambulatory Visit (INDEPENDENT_AMBULATORY_CARE_PROVIDER_SITE_OTHER): Payer: Medicare Other | Admitting: *Deleted

## 2018-05-04 DIAGNOSIS — I482 Chronic atrial fibrillation, unspecified: Secondary | ICD-10-CM

## 2018-05-04 DIAGNOSIS — I5042 Chronic combined systolic (congestive) and diastolic (congestive) heart failure: Secondary | ICD-10-CM

## 2018-05-04 NOTE — Progress Notes (Signed)
Remote pacemaker transmission.   

## 2018-05-07 LAB — CUP PACEART REMOTE DEVICE CHECK
Battery Remaining Longevity: 59 mo
Battery Voltage: 3 V
Brady Statistic AP VS Percent: 0 %
Brady Statistic AS VS Percent: 0 %
Brady Statistic RA Percent Paced: 0 %
Date Time Interrogation Session: 20190619002524
Implantable Lead Implant Date: 20030908
Implantable Lead Implant Date: 20071114
Implantable Lead Location: 753859
Implantable Lead Model: 5076
Implantable Pulse Generator Implant Date: 20160127
Lead Channel Impedance Value: 323 Ohm
Lead Channel Impedance Value: 494 Ohm
Lead Channel Impedance Value: 551 Ohm
Lead Channel Impedance Value: 570 Ohm
Lead Channel Pacing Threshold Amplitude: 0.625 V
Lead Channel Sensing Intrinsic Amplitude: 0.25 mV
Lead Channel Sensing Intrinsic Amplitude: 28.125 mV
Lead Channel Sensing Intrinsic Amplitude: 28.125 mV
Lead Channel Setting Pacing Amplitude: 1.75 V
Lead Channel Setting Pacing Amplitude: 2.5 V
Lead Channel Setting Pacing Pulse Width: 0.4 ms
Lead Channel Setting Pacing Pulse Width: 0.6 ms
MDC IDC LEAD IMPLANT DT: 20030908
MDC IDC LEAD LOCATION: 753858
MDC IDC LEAD LOCATION: 753860
MDC IDC MSMT LEADCHNL LV IMPEDANCE VALUE: 399 Ohm
MDC IDC MSMT LEADCHNL LV IMPEDANCE VALUE: 456 Ohm
MDC IDC MSMT LEADCHNL LV IMPEDANCE VALUE: 494 Ohm
MDC IDC MSMT LEADCHNL RA IMPEDANCE VALUE: 304 Ohm
MDC IDC MSMT LEADCHNL RA SENSING INTR AMPL: 0.375 mV
MDC IDC MSMT LEADCHNL RV IMPEDANCE VALUE: 513 Ohm
MDC IDC MSMT LEADCHNL RV PACING THRESHOLD PULSEWIDTH: 0.4 ms
MDC IDC SET LEADCHNL RV SENSING SENSITIVITY: 5.6 mV
MDC IDC STAT BRADY AP VP PERCENT: 0 %
MDC IDC STAT BRADY AS VP PERCENT: 0 %
MDC IDC STAT BRADY RV PERCENT PACED: 97.38 %

## 2018-05-18 ENCOUNTER — Other Ambulatory Visit: Payer: Self-pay | Admitting: Geriatric Medicine

## 2018-05-18 ENCOUNTER — Ambulatory Visit
Admission: RE | Admit: 2018-05-18 | Discharge: 2018-05-18 | Disposition: A | Discharge status: Home / Self Care | Payer: Medicare Other | Source: Ambulatory Visit | Attending: Geriatric Medicine | Admitting: Geriatric Medicine

## 2018-05-18 DIAGNOSIS — R059 Cough, unspecified: Secondary | ICD-10-CM

## 2018-05-18 DIAGNOSIS — R05 Cough: Secondary | ICD-10-CM

## 2018-06-02 ENCOUNTER — Ambulatory Visit: Payer: Medicare Other | Admitting: Internal Medicine

## 2018-06-02 ENCOUNTER — Encounter: Payer: Self-pay | Admitting: Internal Medicine

## 2018-06-02 ENCOUNTER — Other Ambulatory Visit: Payer: Self-pay | Admitting: Cardiology

## 2018-06-02 VITALS — BP 132/78 | HR 102 | Ht 62.0 in | Wt 218.0 lb

## 2018-06-02 DIAGNOSIS — I482 Chronic atrial fibrillation, unspecified: Secondary | ICD-10-CM

## 2018-06-02 DIAGNOSIS — Z950 Presence of cardiac pacemaker: Secondary | ICD-10-CM

## 2018-06-02 DIAGNOSIS — I10 Essential (primary) hypertension: Secondary | ICD-10-CM | POA: Diagnosis not present

## 2018-06-02 DIAGNOSIS — I5042 Chronic combined systolic (congestive) and diastolic (congestive) heart failure: Secondary | ICD-10-CM

## 2018-06-02 NOTE — Patient Instructions (Signed)
Medication Instructions:  Your physician recommends that you continue on your current medications as directed. Please refer to the Current Medication list given to you today.  Labwork: None ordered.  Testing/Procedures: None ordered.  Follow-Up: Your physician wants you to follow-up in: one year with Dr. Lovena Le.   You will receive a reminder letter in the mail two months in advance. If you don't receive a letter, please call our office to schedule the follow-up appointment.  Remote monitoring is used to monitor your Pacemaker from home. This monitoring reduces the number of office visits required to check your device to one time per year. It allows [place] to keep an eye on the functioning of your device to ensure it is working properly. You are scheduled for a device check from home on 08/03/2018. You may send your transmission at any time that day. If you have a wireless device, the transmission will be sent automatically. After your physician reviews your transmission, you will receive a postcard with your next transmission date.  Any Other Special Instructions Will Be Listed Below (If Applicable).  If you need a refill on your cardiac medications before your next appointment, please call your pharmacy.

## 2018-06-02 NOTE — Progress Notes (Addendum)
HPI [name] returns today for followup. She is a pleasant [age] yo woman with a h/o CHB, chronic atrial fibrillation and orthostasis.  She denies chest pain, sob, or syncope. In the interim she has been stable with no syncope. She has done well over the past few months. No sob.  No other complaints. No fever or chills.  No Known Allergies   Current Outpatient Medications  Medication Sig Dispense Refill  . acetaminophen (TYLENOL) 500 MG tablet Take 1,000 mg by mouth every 6 (six) hours as needed (pain).    [name] beta carotene w/minerals (OCUVITE) tablet Take 1 tablet by mouth daily after supper.     . carboxymethylcellul-glycerin (OPTIVE) 0.5-0.9 % ophthalmic solution Place 1 drop into both eyes daily as needed for dry eyes.    . cetirizine (ZYRTEC) 10 MG tablet Take 10 mg by mouth daily.     . colchicine 0.6 MG tablet Take 0.5 tablets (0.3 mg total) by mouth daily.    . ferrous sulfate 325 (65 FE) MG tablet Take 1 tablet (325 mg total) by mouth 2 (two) times daily with a meal. 60 tablet 6  . midodrine (PROAMATINE) 2.5 MG tablet TAKE 1 TABLET BY MOUTH 3  TIMES DAILY WITH MEALS 21 tablet 0  . Multiple Vitamin (MULTIVITAMIN WITH MINERALS) TABS tablet Take 1 tablet by mouth daily.    . potassium chloride SA (K-DUR,KLOR-CON) 20 MEQ tablet TAKE 1 TABLET BY MOUTH TWO  TIMES DAILY 180 tablet 3  . torsemide (DEMADEX) 20 MG tablet TAKE 1 TABLET BY MOUTH 2  TIMES DAILY. TAKE EXTRA  TABLET IF WEIGHT GAIN  GREATER THAN 3 LBS IN ONE  DAY 270 tablet 1   No current facility-administered medications for this visit.      Past Medical History:  Diagnosis Date  . Anemia   . Brady-tachy syndrome ([place])    a. 2003: post-op afib after MR repair then symptomatic bradycardia requiring pacemaker. b. Upgrade to Medtronic Bi-V Pacemaker 2007.  . Cellulitis and abscess of foot 08/15/2015   RT FOOT  . Chronic atrial fibrillation ([place])    a. Previously failed DCCV/amio/tikosyn.  CHADS2VASC score 5 - not on  anticoagulation due to history of GI bleeding.    . Chronic combined systolic and diastolic CHF (congestive heart failure) (HCC)    a. EF 35-40% on echo 09/2013 b. echo 04/2016: EF 50-55% with mild to moderate MS.  . Cirrhosis ([place])    a. Newly recognized 09/2013 - by CT.  . CKD (chronic kidney disease)   . GI bleeding   . Hypertension   . Mitral valve disorder    a. Severe MR s/p repair 2003 (28 mm annuloplasty ring and and oversew of LAA). No CAD by cath at that time.  [name] NSVT (nonsustained ventricular tachycardia) ([employer])    a. Isolated event during 09/2007 adm.  . Orthostatic hypotension dysautonomic syndrome ([place])   . Pulmonary hypertension (HCC)    Group II secondary to CHF and MV disease  . Spinal stenosis    shes recieved epidural steroid injections in the past  . Tricuspid regurgitation     ROS:   All systems reviewed and negative except as noted in the HPI.   Past Surgical History:  Procedure Laterality Date  . APPENDECTOMY    . BIV PACEMAKER GENERATOR CHANGE OUT N/A 12/13/2014   Procedure: BIV PACEMAKER GENERATOR CHANGE OUT;  Surgeon: Evans Lance, MD;  Location: [facility] CATH LAB;  Service: Cardiovascular;  Laterality: N/A;  . CHOLECYSTECTOMY    . IRRIGATION AND DEBRIDEMENT ABSCESS N/A 10/14/2013   Procedure: IRRIGATION AND DEBRIDEMENT PERINEAL ABSCESS;  Surgeon: Rolm Bookbinder, MD;  Location: [place];  Service: General;  Laterality: N/A;  . VALVE REPLACEMENT     sever mitral regurgitation s/p mitral valve annuloplasty ring     Family History  Problem Relation Age of Onset  . Pancreatic cancer Father   . Other Mother        Mother died at 12 with no real medical problems     Social History   Socioeconomic History  . Marital status: Single    Spouse name: Not on file  . Number of children: Not on file  . Years of education: Not on file  . Highest education level: Not on file  Occupational History  . Not on file  Social Needs  . Financial resource strain:  Not on file  . Food insecurity:    Worry: Not on file    Inability: Not on file  . Transportation needs:    Medical: Not on file    Non-medical: Not on file  Tobacco Use  . Smoking status: Former Smoker    Last attempt to quit: 10/11/1971    Years since quitting: 46.6  . Smokeless tobacco: Never Used  Substance and Sexual Activity  . Alcohol use: Yes    Comment: rare  . Drug use: No  . Sexual activity: Not on file  Lifestyle  . Physical activity:    Days per week: Not on file    Minutes per session: Not on file  . Stress: Not on file  Relationships  . Social connections:    Talks on phone: Not on file    Gets together: Not on file    Attends religious service: Not on file    Active member of club or organization: Not on file    Attends meetings of clubs or organizations: Not on file    Relationship status: Not on file  . Intimate partner violence:    Fear of current or ex partner: Not on file    Emotionally abused: Not on file    Physically abused: Not on file    Forced sexual activity: Not on file  Other Topics Concern  . Not on file  Social History Narrative  . Not on file     BP 132/78   Pulse (!) 102   Ht 5\' 2"  (1.575 m)   Wt 218 lb (98.9 kg)   LMP 10/10/2013   BMI 39.87 kg/m   Physical Exam:  Well appearing but overweight, elderly woman, NAD HEENT: Unremarkable Neck:  6 cm JVD, no thyromegally Lymphatics:  No adenopathy Back:  No CVA tenderness Lungs:  Clear with no wheezes HEART:  Regular rate rhythm, no murmurs, no rubs, no clicks Abd:  soft, positive bowel sounds, no organomegally, no rebound, no guarding Ext:  2 plus pulses, no edema, no cyanosis, no clubbing Skin:  No rashes no nodules Neuro:  CN II through XII intact, motor grossly intact   DEVICE  Normal device function.  See PaceArt for details.   Assess/Plan: 1. Atrial fib - her ventricular rate is well controlled. We will continue her current meds. 2. HTN - her blood pressure is  well controlled. We will follow. 3. PPM - her medtronic Biv PPM is working normally. Will recheck in several months.  Mikle Bosworth.D.

## 2018-06-03 LAB — CUP PACEART INCLINIC DEVICE CHECK
Battery Remaining Longevity: 59 mo
Brady Statistic AP VS Percent: 0 %
Brady Statistic AS VP Percent: 0 %
Brady Statistic AS VS Percent: 0 %
Implantable Lead Implant Date: 20030908
Implantable Lead Implant Date: 20030908
Implantable Lead Location: 753858
Implantable Lead Location: 753859
Implantable Lead Location: 753860
Implantable Lead Model: 5076
Implantable Pulse Generator Implant Date: 20160127
Lead Channel Impedance Value: 304 Ohm
Lead Channel Impedance Value: 323 Ohm
Lead Channel Impedance Value: 475 Ohm
Lead Channel Impedance Value: 494 Ohm
Lead Channel Impedance Value: 513 Ohm
Lead Channel Impedance Value: 551 Ohm
Lead Channel Pacing Threshold Amplitude: 0.5 V
Lead Channel Pacing Threshold Amplitude: 0.75 V
Lead Channel Pacing Threshold Pulse Width: 0.6 ms
Lead Channel Setting Pacing Pulse Width: 0.4 ms
Lead Channel Setting Sensing Sensitivity: 5.6 mV
MDC IDC LEAD IMPLANT DT: 20071114
MDC IDC MSMT BATTERY VOLTAGE: 3 V
MDC IDC MSMT LEADCHNL LV IMPEDANCE VALUE: 399 Ohm
MDC IDC MSMT LEADCHNL LV IMPEDANCE VALUE: 456 Ohm
MDC IDC MSMT LEADCHNL LV IMPEDANCE VALUE: 532 Ohm
MDC IDC MSMT LEADCHNL RV PACING THRESHOLD PULSEWIDTH: 0.4 ms
MDC IDC SESS DTM: 20190717201001
MDC IDC SET LEADCHNL LV PACING AMPLITUDE: 1.75 V
MDC IDC SET LEADCHNL LV PACING PULSEWIDTH: 0.6 ms
MDC IDC SET LEADCHNL RV PACING AMPLITUDE: 2.5 V
MDC IDC STAT BRADY AP VP PERCENT: 0 %
MDC IDC STAT BRADY RA PERCENT PACED: 0 %

## 2018-07-20 ENCOUNTER — Other Ambulatory Visit: Payer: Self-pay | Admitting: Cardiology

## 2018-08-03 ENCOUNTER — Ambulatory Visit (INDEPENDENT_AMBULATORY_CARE_PROVIDER_SITE_OTHER): Payer: Medicare Other | Admitting: *Deleted

## 2018-08-03 ENCOUNTER — Telehealth: Payer: Self-pay | Admitting: Cardiology

## 2018-08-03 DIAGNOSIS — I5032 Chronic diastolic (congestive) heart failure: Secondary | ICD-10-CM | POA: Diagnosis not present

## 2018-08-03 DIAGNOSIS — I4821 Permanent atrial fibrillation: Secondary | ICD-10-CM

## 2018-08-03 NOTE — Progress Notes (Signed)
Remote pacemaker transmission.   

## 2018-08-03 NOTE — Telephone Encounter (Signed)
Spoke with pt and reminded pt of remote transmission that is due today. Pt verbalized understanding.   

## 2018-08-26 LAB — CUP PACEART REMOTE DEVICE CHECK
Battery Voltage: 3 V
Brady Statistic AS VP Percent: 0 %
Brady Statistic RA Percent Paced: 0 %
Date Time Interrogation Session: 20190917195949
Implantable Lead Implant Date: 20030908
Implantable Lead Implant Date: 20071114
Implantable Lead Location: 753859
Implantable Lead Model: 4194
Implantable Lead Model: 5076
Lead Channel Impedance Value: 380 Ohm
Lead Channel Impedance Value: 456 Ohm
Lead Channel Impedance Value: 475 Ohm
Lead Channel Impedance Value: 513 Ohm
Lead Channel Sensing Intrinsic Amplitude: 9.5 mV
Lead Channel Sensing Intrinsic Amplitude: 9.5 mV
Lead Channel Setting Pacing Amplitude: 2.5 V
Lead Channel Setting Pacing Pulse Width: 0.4 ms
Lead Channel Setting Pacing Pulse Width: 0.6 ms
Lead Channel Setting Sensing Sensitivity: 5.6 mV
MDC IDC LEAD IMPLANT DT: 20030908
MDC IDC LEAD LOCATION: 753858
MDC IDC LEAD LOCATION: 753860
MDC IDC MSMT BATTERY REMAINING LONGEVITY: 61 mo
MDC IDC MSMT LEADCHNL LV IMPEDANCE VALUE: 456 Ohm
MDC IDC MSMT LEADCHNL LV IMPEDANCE VALUE: 513 Ohm
MDC IDC MSMT LEADCHNL LV IMPEDANCE VALUE: 532 Ohm
MDC IDC MSMT LEADCHNL RA IMPEDANCE VALUE: 304 Ohm
MDC IDC MSMT LEADCHNL RA IMPEDANCE VALUE: 323 Ohm
MDC IDC MSMT LEADCHNL RA SENSING INTR AMPL: 0.25 mV
MDC IDC MSMT LEADCHNL RA SENSING INTR AMPL: 0.375 mV
MDC IDC MSMT LEADCHNL RV PACING THRESHOLD AMPLITUDE: 0.625 V
MDC IDC MSMT LEADCHNL RV PACING THRESHOLD PULSEWIDTH: 0.4 ms
MDC IDC PG IMPLANT DT: 20160127
MDC IDC SET LEADCHNL LV PACING AMPLITUDE: 1.75 V
MDC IDC STAT BRADY AP VP PERCENT: 0 %
MDC IDC STAT BRADY AP VS PERCENT: 0 %
MDC IDC STAT BRADY AS VS PERCENT: 0 %
MDC IDC STAT BRADY RV PERCENT PACED: 98.73 %

## 2018-09-07 ENCOUNTER — Other Ambulatory Visit: Payer: Self-pay | Admitting: Cardiology

## 2018-11-02 ENCOUNTER — Ambulatory Visit (INDEPENDENT_AMBULATORY_CARE_PROVIDER_SITE_OTHER): Payer: Medicare Other

## 2018-11-02 DIAGNOSIS — I472 Ventricular tachycardia: Secondary | ICD-10-CM

## 2018-11-02 DIAGNOSIS — I4821 Permanent atrial fibrillation: Secondary | ICD-10-CM

## 2018-11-02 DIAGNOSIS — I4729 Other ventricular tachycardia: Secondary | ICD-10-CM

## 2018-11-02 NOTE — Progress Notes (Signed)
Remote pacemaker transmission.   

## 2018-11-03 ENCOUNTER — Encounter: Payer: Self-pay | Admitting: Cardiology

## 2018-12-05 LAB — CUP PACEART REMOTE DEVICE CHECK
Battery Remaining Longevity: 53 mo
Battery Voltage: 3 V
Brady Statistic AP VS Percent: 0 %
Brady Statistic AS VS Percent: 0 %
Date Time Interrogation Session: 20191217202322
Implantable Lead Implant Date: 20071114
Implantable Lead Location: 753859
Implantable Lead Location: 753860
Implantable Lead Model: 4194
Implantable Lead Model: 5076
Implantable Lead Model: 5076
Implantable Pulse Generator Implant Date: 20160127
Lead Channel Impedance Value: 380 Ohm
Lead Channel Impedance Value: 437 Ohm
Lead Channel Impedance Value: 475 Ohm
Lead Channel Impedance Value: 494 Ohm
Lead Channel Impedance Value: 513 Ohm
Lead Channel Pacing Threshold Pulse Width: 0.4 ms
Lead Channel Sensing Intrinsic Amplitude: 0.375 mV
Lead Channel Sensing Intrinsic Amplitude: 22.25 mV
Lead Channel Sensing Intrinsic Amplitude: 22.25 mV
Lead Channel Setting Pacing Amplitude: 2.5 V
Lead Channel Setting Pacing Pulse Width: 0.4 ms
Lead Channel Setting Pacing Pulse Width: 0.6 ms
MDC IDC LEAD IMPLANT DT: 20030908
MDC IDC LEAD IMPLANT DT: 20030908
MDC IDC LEAD LOCATION: 753858
MDC IDC MSMT LEADCHNL LV IMPEDANCE VALUE: 513 Ohm
MDC IDC MSMT LEADCHNL LV IMPEDANCE VALUE: 532 Ohm
MDC IDC MSMT LEADCHNL RA IMPEDANCE VALUE: 304 Ohm
MDC IDC MSMT LEADCHNL RA IMPEDANCE VALUE: 323 Ohm
MDC IDC MSMT LEADCHNL RA SENSING INTR AMPL: 0.25 mV
MDC IDC MSMT LEADCHNL RV PACING THRESHOLD AMPLITUDE: 0.5 V
MDC IDC SET LEADCHNL LV PACING AMPLITUDE: 1.75 V
MDC IDC SET LEADCHNL RV SENSING SENSITIVITY: 5.6 mV
MDC IDC STAT BRADY AP VP PERCENT: 0 %
MDC IDC STAT BRADY AS VP PERCENT: 0 %
MDC IDC STAT BRADY RA PERCENT PACED: 0 %
MDC IDC STAT BRADY RV PERCENT PACED: 97.7 %

## 2019-02-01 ENCOUNTER — Other Ambulatory Visit: Payer: Self-pay

## 2019-02-01 ENCOUNTER — Ambulatory Visit (INDEPENDENT_AMBULATORY_CARE_PROVIDER_SITE_OTHER): Payer: Medicare Other | Admitting: *Deleted

## 2019-02-01 DIAGNOSIS — I4821 Permanent atrial fibrillation: Secondary | ICD-10-CM | POA: Diagnosis not present

## 2019-02-02 LAB — CUP PACEART REMOTE DEVICE CHECK
Battery Remaining Longevity: 53 mo
Battery Voltage: 2.99 V
Brady Statistic AP VP Percent: 0 %
Brady Statistic AS VS Percent: 0 %
Brady Statistic RA Percent Paced: 0 %
Date Time Interrogation Session: 20200317175034
Implantable Lead Implant Date: 20030908
Implantable Lead Location: 753858
Implantable Lead Location: 753859
Implantable Lead Location: 753860
Implantable Lead Model: 4194
Implantable Lead Model: 5076
Implantable Pulse Generator Implant Date: 20160127
Lead Channel Impedance Value: 323 Ohm
Lead Channel Impedance Value: 399 Ohm
Lead Channel Impedance Value: 437 Ohm
Lead Channel Impedance Value: 456 Ohm
Lead Channel Impedance Value: 475 Ohm
Lead Channel Impedance Value: 494 Ohm
Lead Channel Impedance Value: 494 Ohm
Lead Channel Sensing Intrinsic Amplitude: 0.25 mV
Lead Channel Sensing Intrinsic Amplitude: 0.375 mV
Lead Channel Sensing Intrinsic Amplitude: 7.5 mV
Lead Channel Sensing Intrinsic Amplitude: 7.5 mV
Lead Channel Setting Pacing Amplitude: 2.5 V
Lead Channel Setting Pacing Pulse Width: 0.4 ms
Lead Channel Setting Pacing Pulse Width: 0.6 ms
Lead Channel Setting Sensing Sensitivity: 5.6 mV
MDC IDC LEAD IMPLANT DT: 20030908
MDC IDC LEAD IMPLANT DT: 20071114
MDC IDC MSMT LEADCHNL LV IMPEDANCE VALUE: 342 Ohm
MDC IDC MSMT LEADCHNL RA IMPEDANCE VALUE: 304 Ohm
MDC IDC MSMT LEADCHNL RV PACING THRESHOLD AMPLITUDE: 0.625 V
MDC IDC MSMT LEADCHNL RV PACING THRESHOLD PULSEWIDTH: 0.4 ms
MDC IDC SET LEADCHNL LV PACING AMPLITUDE: 1.75 V
MDC IDC STAT BRADY AP VS PERCENT: 0 %
MDC IDC STAT BRADY AS VP PERCENT: 0 %
MDC IDC STAT BRADY RV PERCENT PACED: 95.17 %

## 2019-02-03 ENCOUNTER — Other Ambulatory Visit: Payer: Self-pay | Admitting: Cardiology

## 2019-02-09 ENCOUNTER — Encounter: Payer: Self-pay | Admitting: Cardiology

## 2019-02-09 NOTE — Progress Notes (Signed)
Remote pacemaker transmission.   

## 2019-04-12 ENCOUNTER — Telehealth: Payer: Self-pay | Admitting: *Deleted

## 2019-04-12 NOTE — Telephone Encounter (Signed)
..   Virtual Visit Pre-Appointment Phone Call  "(Name), I am calling you today to discuss your upcoming appointment. We are currently trying to limit exposure to the virus that causes COVID-19 by seeing patients at home rather than in the office."  1. "What is the BEST phone number to call the day of the visit?" - include this in appointment notes  2. "Do you have or have access to (through a family member/friend) a smartphone with video capability that we can use for your visit?" a. If yes - list this number in appt notes as "cell" (if different from BEST phone #) and list the appointment type as a VIDEO visit in appointment notes b. If no - list the appointment type as a PHONE visit in appointment notes  3. Confirm consent - "In the setting of the current Covid19 crisis, you are scheduled for a (phone or video) visit with your provider on (date) at (time).  Just as we do with many in-office visits, in order for you to participate in this visit, we must obtain consent.  If you'd like, I can send this to your mychart (if signed up) or email for you to review.  Otherwise, I can obtain your verbal consent now.  All virtual visits are billed to your insurance company just like a normal visit would be.  By agreeing to a virtual visit, we'd like you to understand that the technology does not allow for your provider to perform an examination, and thus may limit your provider's ability to fully assess your condition. If your provider identifies any concerns that need to be evaluated in person, we will make arrangements to do so.  Finally, though the technology is pretty good, we cannot assure that it will always work on either your or our end, and in the setting of a video visit, we may have to convert it to a phone-only visit.  In either situation, we cannot ensure that we have a secure connection.  Are you willing to proceed?" STAFF: Did the patient verbally acknowledge consent to telehealth visit? Document  YES/NO here: YES  4. Advise patient to be prepared - "Two hours prior to your appointment, go ahead and check your blood pressure, pulse, oxygen saturation, and your weight (if you have the equipment to check those) and write them all down. When your visit starts, your provider will ask you for this information. If you have an Apple Watch or Kardia device, please plan to have heart rate information ready on the day of your appointment. Please have a pen and paper handy nearby the day of the visit as well."  5. Give patient instructions for MyChart download to smartphone OR Doximity/Doxy.me as below if video visit (depending on what platform provider is using)EXPLAINED DOXIMITY/DOXY.ME PROCESS AND PT STATES UNDERSTANDING   6. Inform patient they will receive a phone call 15 minutes prior to their appointment time (may be from unknown caller ID) so they should be prepared to answer PT AWARE    TELEPHONE CALL NOTE  [name] has been deemed a candidate for a follow-up tele-health visit to limit community exposure during the Covid-19 pandemic. I spoke with the patient via phone to ensure availability of phone/video source, confirm preferred email & phone number, and discuss instructions and expectations.  I reminded Caroleen Stoermer [place] to be prepared with any vital sign and/or heart rhythm information that could potentially be obtained via home monitoring, at the time of her visit. I reminded  Ambera J Crafts to expect a phone call prior to her visit.  Juventino Slovak, CMA 04/12/2019 12:19 PM   INSTRUCTIONS FOR DOWNLOADING THE MYCHART APP TO SMARTPHONE  - The patient must first make sure to have activated MyChart and know their login information - If Apple, go to [employer] and type in MyChart in the search bar and download the app. If Android, ask patient to go to [employer] and type in [place] in the search bar and download the app. The app is free but as with any other app downloads, their  phone may require them to verify saved payment information or Apple/Android password.  - The patient will need to then log into the app with their MyChart username and password, and select [facility] as their healthcare provider to link the account. When it is time for your visit, go to the MyChart app, find appointments, and click Begin Video Visit. Be sure to Select Allow for your device to access the Microphone and Camera for your visit. You will then be connected, and your provider will be with you shortly.  **If they have any issues connecting, or need assistance please contact MyChart service desk [phone])**  **If using a computer, in order to ensure the best quality for their visit they will need to use either of the following Internet Browsers: [employer], or Google Chrome**  IF USING DOXIMITY or DOXY.ME - The patient will receive a link just prior to their visit by text.     FULL LENGTH CONSENT FOR TELE-HEALTH VISIT   I hereby voluntarily request, consent and authorize [facility] and its employed or contracted physicians, physician assistants, nurse practitioners or other licensed health care professionals (the Practitioner), to provide me with telemedicine health care services (the "Services") as deemed necessary by the treating Practitioner. I acknowledge and consent to receive the Services by the Practitioner via telemedicine. I understand that the telemedicine visit will involve communicating with the Practitioner through live audiovisual communication technology and the disclosure of certain medical information by electronic transmission. I acknowledge that I have been given the opportunity to request an in-person assessment or other available alternative prior to the telemedicine visit and am voluntarily participating in the telemedicine visit.  I understand that I have the right to withhold or withdraw my consent to the use of telemedicine in the course of  my care at any time, without affecting my right to future care or treatment, and that the Practitioner or I may terminate the telemedicine visit at any time. I understand that I have the right to inspect all information obtained and/or recorded in the course of the telemedicine visit and may receive copies of available information for a reasonable fee.  I understand that some of the potential risks of receiving the Services via telemedicine include:  [name] Delay or interruption in medical evaluation due to technological equipment failure or disruption; . Information transmitted may not be sufficient (e.g. poor resolution of images) to allow for appropriate medical decision making by the Practitioner; and/or  . In rare instances, security protocols could fail, causing a breach of personal health information.  Furthermore, I acknowledge that it is my responsibility to provide information about my medical history, conditions and care that is complete and accurate to the best of my ability. I acknowledge that Practitioner's advice, recommendations, and/or decision may be based on factors not within their control, such as incomplete or inaccurate data provided by me or distortions of  diagnostic images or specimens that may result from electronic transmissions. I understand that the practice of medicine is not an exact science and that Practitioner makes no warranties or guarantees regarding treatment outcomes. I acknowledge that I will receive a copy of this consent concurrently upon execution via email to the email address I last provided but may also request a printed copy by calling the office of [facility].    I understand that my insurance will be billed for this visit.   I have read or had this consent read to me. . I understand the contents of this consent, which adequately explains the benefits and risks of the Services being provided via telemedicine.  . I have been provided ample opportunity to ask  questions regarding this consent and the Services and have had my questions answered to my satisfaction. . I give my informed consent for the services to be provided through the use of telemedicine in my medical care  By participating in this telemedicine visit I agree to the above.

## 2019-04-14 ENCOUNTER — Other Ambulatory Visit: Payer: Self-pay

## 2019-04-14 ENCOUNTER — Encounter: Payer: Self-pay | Admitting: Cardiology

## 2019-04-14 ENCOUNTER — Telehealth (INDEPENDENT_AMBULATORY_CARE_PROVIDER_SITE_OTHER): Payer: Medicare Other | Admitting: Cardiology

## 2019-04-14 DIAGNOSIS — I340 Nonrheumatic mitral (valve) insufficiency: Secondary | ICD-10-CM

## 2019-04-14 DIAGNOSIS — I5032 Chronic diastolic (congestive) heart failure: Secondary | ICD-10-CM

## 2019-04-14 DIAGNOSIS — N183 Chronic kidney disease, stage 3 unspecified: Secondary | ICD-10-CM

## 2019-04-14 DIAGNOSIS — I4729 Other ventricular tachycardia: Secondary | ICD-10-CM

## 2019-04-14 DIAGNOSIS — I472 Ventricular tachycardia: Secondary | ICD-10-CM

## 2019-04-14 DIAGNOSIS — I10 Essential (primary) hypertension: Secondary | ICD-10-CM

## 2019-04-14 DIAGNOSIS — I4821 Permanent atrial fibrillation: Secondary | ICD-10-CM | POA: Diagnosis not present

## 2019-04-14 DIAGNOSIS — I130 Hypertensive heart and chronic kidney disease with heart failure and stage 1 through stage 4 chronic kidney disease, or unspecified chronic kidney disease: Secondary | ICD-10-CM | POA: Diagnosis not present

## 2019-04-14 DIAGNOSIS — I951 Orthostatic hypotension: Secondary | ICD-10-CM

## 2019-04-14 DIAGNOSIS — I059 Rheumatic mitral valve disease, unspecified: Secondary | ICD-10-CM

## 2019-04-14 NOTE — Progress Notes (Signed)
Virtual Visit via Video Note   This visit type was conducted due to national recommendations for restrictions regarding the COVID-19 Pandemic (e.g. social distancing) in an effort to limit this patient's exposure and mitigate transmission in our community.  Due to her co-morbid illnesses, this patient is at least at moderate risk for complications without adequate follow up.  This format is felt to be most appropriate for this patient at this time.  All issues noted in this document were discussed and addressed.  A limited physical exam was performed with this format.  Please refer to the patient's chart for her consent to telehealth for [facility].   Evaluation Performed:  Follow-up visit  This visit type was conducted due to national recommendations for restrictions regarding the COVID-19 Pandemic (e.g. social distancing).  This format is felt to be most appropriate for this patient at this time.  All issues noted in this document were discussed and addressed.  No physical exam was performed (except for noted visual exam findings with Video Visits).  Please refer to the patient's chart (MyChart message for video visits and phone note for telephone visits) for the patient's consent to telehealth for [facility].  Date:  04/14/2019   ID:  [name], DOB 19-Jul-1926, MRN [id number]  Patient Location:  Home  Provider location:   [place]  PCP:  [name], MD  Cardiologist:  [name], MD Electrophysiologist:  None   Chief Complaint:  Mitral regur/stenosis, permenent atrial fibrillation, CHF  History of Present Illness:    [name] is a 85 y.o. female who presents via audio/video conferencing for a telehealth visit today.    [name] is a 85 y.o. 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 Most recentechocardiogram, 04/2016,showed an improved EF of 50-55% with mild to moderate mitral stenosis. Due to hypotension she isunable to tolerate BB or ACE- I. She is on Midodrine.  She had an admission for syncope on 10/06/2017.  She apparently got up off the couch to go to the bathroom and when she was heading back to her couch she got tunnel vision and then passed out and awoke on the floor.  She was very weak at the time and had a hard time getting back up the sofa.  She laid down and slept for a few hours and then when she got back up had another episode of syncope.  On admission she was hypotensive and creatinine increased from 1.42.  Her pacemaker was interrogated showing normal device function and no arrhythmias.  It was felt that her syncope was related to decreased p.o. intake leading to hypotension and triggered by orthostasis when standing up.  She had also not been taking her Midrin.  OptiVol on her by V pacemaker interrogation was normal.   She is here today for followup and is doing well.  She denies any chest pain or pressure, SOB, DOE, PND, orthopnea, LE edema, dizziness, palpitations or syncope. She is compliant with her meds and is tolerating meds with no SE.    The patient does not have symptoms concerning for COVID-19 infection (fever, chills, cough, or new shortness of breath).    Prior CV studies:   The following studies  were reviewed today:  2D echo 2017  Past Medical History:  Diagnosis Date  . Anemia   . Brady-tachy syndrome ([place])    a. 2003: post-op afib after MR repair then symptomatic bradycardia requiring pacemaker. b. Upgrade to Medtronic Bi-V Pacemaker 2007.  . Cellulitis and abscess of foot 08/15/2015   RT FOOT  . Chronic atrial fibrillation    a. Previously failed DCCV/amio/tikosyn.  CHADS2VASC score 5 - not on  anticoagulation due to history of GI bleeding.    . Chronic combined systolic and diastolic CHF (congestive heart failure) (HCC)    a. EF 35-40% on echo 09/2013 b. echo 04/2016: EF 50-55% with mild to moderate MS.  . Cirrhosis ([place])    a. Newly recognized 09/2013 - by CT.  . CKD (chronic kidney disease)   . GI bleeding   . Hypertension   . Mitral valve disorder    a. Severe MR s/p repair 2003 (28 mm annuloplasty ring and and oversew of LAA). No CAD by cath at that time.  [name] NSVT (nonsustained ventricular tachycardia) ([place])    a. Isolated event during 09/2007 adm.  . Orthostatic hypotension dysautonomic syndrome ([place])   . Pulmonary hypertension (HCC)    Group II secondary to CHF and MV disease  . Spinal stenosis    shes recieved epidural steroid injections in the past  . Tricuspid regurgitation    Past Surgical History:  Procedure Laterality Date  . APPENDECTOMY    . BIV PACEMAKER GENERATOR CHANGE OUT N/A 12/13/2014   Procedure: BIV PACEMAKER GENERATOR CHANGE OUT;  Surgeon: Evans Lance, MD;  Location: [facility] CATH LAB;  Service: Cardiovascular;  Laterality: N/A;  . CHOLECYSTECTOMY    . IRRIGATION AND DEBRIDEMENT ABSCESS N/A 10/14/2013   Procedure: IRRIGATION AND DEBRIDEMENT PERINEAL ABSCESS;  Surgeon: Rolm Bookbinder, MD;  Location: [place];  Service: General;  Laterality: N/A;  . VALVE REPLACEMENT     sever mitral regurgitation s/p mitral valve annuloplasty ring     Current Meds  Medication Sig  . acetaminophen (TYLENOL) 500 MG tablet Take 1,000 mg by mouth every 6 (six) hours as needed (pain).  [name] beta carotene w/minerals (OCUVITE) tablet Take 1 tablet by mouth daily after supper.   . Febuxostat 80 MG TABS Take 80 mg by mouth daily.  . ferrous sulfate 325 (65 FE) MG tablet Take 1 tablet (325 mg total) by mouth 2 (two) times daily with a meal.  . midodrine (PROAMATINE) 2.5 MG tablet TAKE 1 TABLET BY MOUTH 3  TIMES DAILY WITH MEALS  . Multiple Vitamin (MULTIVITAMIN WITH MINERALS) TABS  tablet Take 1 tablet by mouth daily.  . pantoprazole (PROTONIX) 40 MG tablet Take 40 mg by mouth daily.  . potassium chloride SA (K-DUR,KLOR-CON) 20 MEQ tablet TAKE 1 TABLET BY MOUTH TWO  TIMES DAILY  . torsemide (DEMADEX) 20 MG tablet TAKE 1 TABLET BY MOUTH 2  TIMES DAILY. TAKE EXTRA  TABLET IF WEIGHT GAIN  GREATER THAN 3 LBS IN ONE  DAY     Allergies:   Patient has no known allergies.   Social History   Tobacco Use  . Smoking status: Former Smoker    Last attempt to quit: 10/11/1971    Years since quitting: 47.5  . Smokeless tobacco: Never Used  Substance Use Topics  . Alcohol use: Yes    Comment: rare  . Drug use: No     Family Hx: [name] family history includes Other in her mother; Pancreatic cancer in her father.  ROS:   Please see the history of present illness.     All other systems reviewed and are negative.   Labs/Other Tests and Data Reviewed:    Recent Labs: No results found for requested labs within last 8760 hours.   Recent Lipid Panel No results found for: CHOL, TRIG, HDL, CHOLHDL, LDLCALC, LDLDIRECT  Wt Readings from Last 3 Encounters:  04/14/19 208 lb (94.3 kg)  06/02/18 218 lb (98.9 kg)  03/16/18 214 lb (97.1 kg)     Objective:    Vital Signs:  BP 117/62   Pulse 71   Ht '5\' 2"'  (1.575 m)   Wt 208 lb (94.3 kg)   LMP 10/10/2013   SpO2 97%   BMI 38.04 kg/m    CONSTITUTIONAL:  Well nourished, well developed female in no acute distress.  EYES: anicteric MOUTH: oral mucosa is pink RESPIRATORY: Normal respiratory effort, symmetric expansion CARDIOVASCULAR: No peripheral edema SKIN: No rash, lesions or ulcers MUSCULOSKELETAL: no digital cyanosis NEURO: Cranial Nerves II-XII grossly intact, moves all extremities PSYCH: Intact judgement and insight.  A&O x 3, Mood/affect appropriate   ASSESSMENT & PLAN:    1.  Permanent atrial fibrillation - her heart rate is very well controlled on exam today.  She is not on chronic anticoagulation due  to a history of GI bleed in the past.  She has not had any palpitations.  She she has not been on any beta-blockers or calcium channel blockers due to orthostatic hypotension.  2.  Chronic diastolic CHF -she has not had any shortness of breath except with extreme exertion and no lower extremity edema.  Her weight is actually down from what it was a year ago.  She says she feels great.   She will continue on Demadex 20 mg twice daily with an extra tablet if she gains more than 3 pounds in a day.  Her last creatinine was stable at 1.86.  3.  Orthostatic hypotension -she has a remote history of syncope in the setting of dehydration.  She has not no further dizzy spells on Midodrin.  She will continue Midrin 2.5 mg 3 times daily.  4.  Nonsustained ventricular tachycardia -she has had no recurrence on pacer checks.   EF 50 to 55% on echo 2017.  She cannot take beta-blockers or CCB's due to orthostatic hypotension.  5.  Severe mitral regurgitation - she is status post mitral valve repair in 2003.  Her last echo was in 2017 showing no evidence of MR with mild to moderate mitral stenosis and mean mitral valve gradient 5 mmHg.  She is completely asymptomatic.  Given her advanced age and the current current situation I am not going to repeat an echo at this time.  I will reassess how she is doing in 6 months.  She knows to call me if she develops any shortness of breath from her baseline or lower extremity edema.  6.  Chronic kidney disease stage III -her creatinine has been stable around 1.86 last checked in January 2020.  I will repeat a be met in July.  7.  COVID-19 Education:The signs and symptoms of COVID-19 were discussed with the patient and how to seek care for testing (follow up with PCP or arrange E-visit).  The importance of social distancing was discussed today.  Patient Risk:   After full review of this patient's clinical status, I feel that they are at least moderate risk at this time.  Time:    Today, I have spent  10 minutes directly with the patient on video discussing medical problems including HTN, CHF, afib, VT, MR.  We also reviewed the symptoms of COVID 19 and the ways to protect against contracting the virus with telehealth technology.  I spent an additional 5 minutes reviewing patient's chart including labs and 2D echo.  Medication Adjustments/Labs and Tests Ordered: Current medicines are reviewed at length with the patient today.  Concerns regarding medicines are outlined above.  Tests Ordered: No orders of the defined types were placed in this encounter.  Medication Changes: No orders of the defined types were placed in this encounter.   Disposition:  Follow up in 6 month(s)  Signed, [name], MD  04/14/2019 1:58 PM    [facility]

## 2019-04-14 NOTE — Patient Instructions (Signed)
Medication Instructions:  Your physician recommends that you continue on your current medications as directed. Please refer to the Current Medication list given to you today.  If you need a refill on your cardiac medications before your next appointment, please call your pharmacy.   Lab work: BMET on 05/30/19  If you have labs (blood work) drawn today and your tests are completely normal, you will receive your results only by: [name] MyChart Message (if you have MyChart) OR . A paper copy in the mail If you have any lab test that is abnormal or we need to change your treatment, we will call you to review the results.  Testing/Procedures: None  Follow-Up: At [facility], you and your health needs are our priority.  As part of our continuing mission to provide you with exceptional heart care, we have created designated Provider Care Teams.  These Care Teams include your primary Cardiologist (physician) and Advanced Practice Providers (APPs -  Physician Assistants and Nurse Practitioners) who all work together to provide you with the care you need, when you need it. You will need a follow up appointment in 6 months.  Please call our office 2 months in advance to schedule this appointment.  You may see Dr. Radford Pax or one of the following Advanced Practice Providers on your designated Care Team:   [place], PA-C Melina Copa, PA-C . Ermalinda Barrios, PA-C

## 2019-05-03 ENCOUNTER — Encounter: Payer: Medicare Other | Admitting: *Deleted

## 2019-05-04 ENCOUNTER — Telehealth: Payer: Self-pay

## 2019-05-04 NOTE — Telephone Encounter (Signed)
Left message for patient to remind of missed remote transmission.  

## 2019-05-13 ENCOUNTER — Encounter: Payer: Self-pay | Admitting: Cardiology

## 2019-05-25 ENCOUNTER — Telehealth: Payer: Self-pay | Admitting: Cardiology

## 2019-05-25 NOTE — Telephone Encounter (Signed)
Called patient lmtcb 

## 2019-05-25 NOTE — Telephone Encounter (Signed)
New message:   Patient calling stating that she received a letter concering her machine. Please call patient back.

## 2019-05-26 ENCOUNTER — Ambulatory Visit (INDEPENDENT_AMBULATORY_CARE_PROVIDER_SITE_OTHER): Payer: Medicare Other | Admitting: *Deleted

## 2019-05-26 DIAGNOSIS — I5032 Chronic diastolic (congestive) heart failure: Secondary | ICD-10-CM

## 2019-05-26 DIAGNOSIS — I472 Ventricular tachycardia: Secondary | ICD-10-CM | POA: Diagnosis not present

## 2019-05-26 DIAGNOSIS — I4729 Other ventricular tachycardia: Secondary | ICD-10-CM

## 2019-05-26 LAB — CUP PACEART REMOTE DEVICE CHECK
Battery Remaining Longevity: 50 mo
Battery Voltage: 2.99 V
Brady Statistic AP VP Percent: 0 %
Brady Statistic AP VS Percent: 0 %
Brady Statistic AS VP Percent: 0 %
Brady Statistic AS VS Percent: 0 %
Brady Statistic RA Percent Paced: 0 %
Brady Statistic RV Percent Paced: 94.41 %
Date Time Interrogation Session: 20200709023152
Implantable Lead Implant Date: 20030908
Implantable Lead Implant Date: 20030908
Implantable Lead Implant Date: 20071114
Implantable Lead Location: 753858
Implantable Lead Location: 753859
Implantable Lead Location: 753860
Implantable Lead Model: 4194
Implantable Lead Model: 5076
Implantable Lead Model: 5076
Implantable Pulse Generator Implant Date: 20160127
Lead Channel Impedance Value: 323 Ohm
Lead Channel Impedance Value: 342 Ohm
Lead Channel Impedance Value: 399 Ohm
Lead Channel Impedance Value: 418 Ohm
Lead Channel Impedance Value: 437 Ohm
Lead Channel Impedance Value: 475 Ohm
Lead Channel Impedance Value: 475 Ohm
Lead Channel Impedance Value: 475 Ohm
Lead Channel Pacing Threshold Amplitude: 0.5 V
Lead Channel Pacing Threshold Pulse Width: 0.4 ms
Lead Channel Sensing Intrinsic Amplitude: 24.25 mV
Lead Channel Setting Pacing Amplitude: 1.75 V
Lead Channel Setting Pacing Amplitude: 2.5 V
Lead Channel Setting Pacing Pulse Width: 0.4 ms
Lead Channel Setting Pacing Pulse Width: 0.6 ms
Lead Channel Setting Sensing Sensitivity: 5.6 mV

## 2019-05-27 NOTE — Telephone Encounter (Signed)
Patient was calling about her pacemaker not her cpap machine.

## 2019-05-30 ENCOUNTER — Other Ambulatory Visit: Payer: Self-pay

## 2019-05-30 ENCOUNTER — Other Ambulatory Visit: Payer: Medicare Other | Admitting: *Deleted

## 2019-05-30 DIAGNOSIS — I4821 Permanent atrial fibrillation: Secondary | ICD-10-CM

## 2019-05-30 DIAGNOSIS — I5032 Chronic diastolic (congestive) heart failure: Secondary | ICD-10-CM

## 2019-05-31 LAB — BASIC METABOLIC PANEL
BUN/Creatinine Ratio: 16 (ref 12–28)
BUN: 25 mg/dL (ref 10–36)
CO2: 22 mmol/L (ref 20–29)
Calcium: 8.7 mg/dL (ref 8.7–10.3)
Chloride: 108 mmol/L — ABNORMAL HIGH (ref 96–106)
Creatinine, Ser: 1.52 mg/dL — ABNORMAL HIGH (ref 0.57–1.00)
GFR calc Af Amer: 35 mL/min/{1.73_m2} — ABNORMAL LOW (ref >59)
GFR calc non Af Amer: 30 mL/min/{1.73_m2} — ABNORMAL LOW (ref >59)
Glucose: 87 mg/dL (ref 65–99)
Potassium: 4.6 mmol/L (ref 3.5–5.2)
Sodium: 146 mmol/L — ABNORMAL HIGH (ref 134–144)

## 2019-06-01 ENCOUNTER — Other Ambulatory Visit: Payer: Self-pay | Admitting: Cardiology

## 2019-06-01 NOTE — Progress Notes (Signed)
Remote pacemaker transmission.   

## 2019-06-23 ENCOUNTER — Other Ambulatory Visit: Payer: Self-pay | Admitting: Cardiology

## 2019-08-06 ENCOUNTER — Other Ambulatory Visit: Payer: Self-pay | Admitting: Internal Medicine

## 2019-08-24 ENCOUNTER — Encounter: Payer: Self-pay | Admitting: Internal Medicine

## 2019-08-24 ENCOUNTER — Ambulatory Visit (INDEPENDENT_AMBULATORY_CARE_PROVIDER_SITE_OTHER): Payer: Medicare Other | Admitting: Internal Medicine

## 2019-08-24 ENCOUNTER — Other Ambulatory Visit: Payer: Self-pay

## 2019-08-24 VITALS — BP 140/72 | HR 105 | Ht 62.0 in | Wt 203.0 lb

## 2019-08-24 DIAGNOSIS — I5042 Chronic combined systolic (congestive) and diastolic (congestive) heart failure: Secondary | ICD-10-CM

## 2019-08-24 DIAGNOSIS — Z950 Presence of cardiac pacemaker: Secondary | ICD-10-CM

## 2019-08-24 DIAGNOSIS — I4821 Permanent atrial fibrillation: Secondary | ICD-10-CM

## 2019-08-24 LAB — CUP PACEART INCLINIC DEVICE CHECK
Battery Remaining Longevity: 52 mo
Battery Voltage: 2.99 V
Brady Statistic AP VP Percent: 0 %
Brady Statistic AP VS Percent: 0 %
Brady Statistic AS VP Percent: 0 %
Brady Statistic AS VS Percent: 0 %
Brady Statistic RA Percent Paced: 0 %
Brady Statistic RV Percent Paced: 97.28 %
Date Time Interrogation Session: 20201007173028
Implantable Lead Implant Date: 20030908
Implantable Lead Implant Date: 20030908
Implantable Lead Implant Date: 20071114
Implantable Lead Location: 753858
Implantable Lead Location: 753859
Implantable Lead Location: 753860
Implantable Lead Model: 4194
Implantable Lead Model: 5076
Implantable Lead Model: 5076
Implantable Pulse Generator Implant Date: 20160127
Lead Channel Impedance Value: 304 Ohm
Lead Channel Impedance Value: 323 Ohm
Lead Channel Impedance Value: 380 Ohm
Lead Channel Impedance Value: 437 Ohm
Lead Channel Impedance Value: 437 Ohm
Lead Channel Impedance Value: 475 Ohm
Lead Channel Impedance Value: 475 Ohm
Lead Channel Impedance Value: 494 Ohm
Lead Channel Impedance Value: 494 Ohm
Lead Channel Pacing Threshold Amplitude: 0.5 V
Lead Channel Pacing Threshold Amplitude: 0.75 V
Lead Channel Pacing Threshold Pulse Width: 0.4 ms
Lead Channel Pacing Threshold Pulse Width: 0.6 ms
Lead Channel Sensing Intrinsic Amplitude: 25.75 mV
Lead Channel Setting Pacing Amplitude: 1.75 V
Lead Channel Setting Pacing Amplitude: 2.5 V
Lead Channel Setting Pacing Pulse Width: 0.4 ms
Lead Channel Setting Pacing Pulse Width: 0.6 ms
Lead Channel Setting Sensing Sensitivity: 5.6 mV

## 2019-08-24 NOTE — Patient Instructions (Signed)
Medication Instructions:  Your physician recommends that you continue on your current medications as directed. Please refer to the Current Medication list given to you today.  Labwork: None ordered.  Testing/Procedures: None ordered.  Follow-Up: Your physician wants you to follow-up in: one year with Dr. Lovena Le.   You will receive a reminder letter in the mail two months in advance. If you don't receive a letter, please call our office to schedule the follow-up appointment.  Remote monitoring is used to monitor your Pacemaker from home. This monitoring reduces the number of office visits required to check your device to one time per year. It allows [place] to keep an eye on the functioning of your device to ensure it is working properly. You are scheduled for a device check from home on 08/26/2019. You may send your transmission at any time that day. If you have a wireless device, the transmission will be sent automatically. After your physician reviews your transmission, you will receive a postcard with your next transmission date.  Any Other Special Instructions Will Be Listed Below (If Applicable).  If you need a refill on your cardiac medications before your next appointment, please call your pharmacy.

## 2019-08-24 NOTE — Progress Notes (Signed)
HPI [name] returns today for followup. She is a pleasant 85 yo woman with a h/o CHB, chronic atrial fibrillation and orthostasis. She denies chest pain, sob, or syncope. In the interim she has been stable with no syncope. She has done well over the past few months. No sob. No other complaints. No fever or chills. No Known Allergies   Current Outpatient Medications  Medication Sig Dispense Refill  . acetaminophen (TYLENOL) 500 MG tablet Take 1,000 mg by mouth every 6 (six) hours as needed (pain).    [name] beta carotene w/minerals (OCUVITE) tablet Take 1 tablet by mouth daily after supper.     . Febuxostat 80 MG TABS Take 80 mg by mouth daily.    . ferrous sulfate 325 (65 FE) MG tablet Take 1 tablet (325 mg total) by mouth 2 (two) times daily with a meal. 60 tablet 6  . midodrine (PROAMATINE) 2.5 MG tablet Take 1 tablet (2.5 mg total) by mouth 3 (three) times daily with meals. Please keep upcoming appt with Dr. Lovena Le in October for future refills. Thank you 270 tablet 0  . Multiple Vitamin (MULTIVITAMIN WITH MINERALS) TABS tablet Take 1 tablet by mouth daily.    . potassium chloride SA (K-DUR) 20 MEQ tablet TAKE 1 TABLET BY MOUTH TWO  TIMES DAILY 180 tablet 2  . torsemide (DEMADEX) 20 MG tablet TAKE 1 TABLET BY MOUTH 2  TIMES DAILY. TAKE EXTRA  TABLET IF WEIGHT GAIN  GREATER THAN 3 LBS IN 1 DAY 270 tablet 3   No current facility-administered medications for this visit.      Past Medical History:  Diagnosis Date  . Anemia   . Brady-tachy syndrome ([place])    a. 2003: post-op afib after MR repair then symptomatic bradycardia requiring pacemaker. b. Upgrade to Medtronic Bi-V Pacemaker 2007.  . Cellulitis and abscess of foot 08/15/2015   RT FOOT  . Chronic atrial fibrillation ([place])    a. Previously failed DCCV/amio/tikosyn.  CHADS2VASC score 5 - not on anticoagulation due to history of GI bleeding.    . Chronic combined systolic and diastolic CHF (congestive heart failure) (HCC)    a. EF 35-40% on echo 09/2013 b. echo 04/2016: EF 50-55% with mild to moderate MS.  . Cirrhosis ([employer])    a. Newly recognized 09/2013 - by CT.  . CKD (chronic kidney disease)   . GI bleeding   . Hypertension   . Mitral valve disorder    a. Severe MR s/p repair 2003 (28 mm annuloplasty ring and and oversew of LAA). No CAD by cath at that time.  [name] NSVT (nonsustained ventricular tachycardia) ([place])    a. Isolated event during 09/2007 adm.  . Orthostatic hypotension dysautonomic syndrome ([place])   . Pulmonary hypertension (HCC)    Group II secondary to CHF and MV disease  . Spinal stenosis    shes recieved epidural steroid injections in the past  . Tricuspid regurgitation     ROS:   All systems reviewed and negative except as noted in the HPI.   Past Surgical History:  Procedure Laterality Date  . APPENDECTOMY    . BIV PACEMAKER GENERATOR CHANGE OUT N/A 12/13/2014   Procedure: BIV PACEMAKER GENERATOR CHANGE OUT;  Surgeon: Evans Lance, MD;  Location: [facility] CATH LAB;  Service: Cardiovascular;  Laterality: N/A;  . CHOLECYSTECTOMY    . IRRIGATION AND DEBRIDEMENT ABSCESS N/A 10/14/2013   Procedure: IRRIGATION AND DEBRIDEMENT PERINEAL ABSCESS;  Surgeon: Rolm Bookbinder, MD;  Location: [facility] OR;  Service: General;  Laterality: N/A;  . VALVE REPLACEMENT     sever mitral regurgitation s/p mitral valve annuloplasty ring     Family History  Problem Relation Age of Onset  . Pancreatic cancer Father   . Other Mother        Mother died at 58 with no real medical problems     Social History   Socioeconomic History  . Marital status: Single    Spouse name: Not on file  . Number of children: Not on file  . Years of education: Not on file  . Highest education level: Not on file  Occupational History  . Not on file  Social Needs  . Financial resource strain: Not on file  . Food insecurity    Worry: Not on file    Inability: Not on file  . Transportation needs    Medical: Not on file     Non-medical: Not on file  Tobacco Use  . Smoking status: Former Smoker    Quit date: 10/11/1971    Years since quitting: 47.9  . Smokeless tobacco: Never Used  Substance and Sexual Activity  . Alcohol use: Yes    Comment: rare  . Drug use: No  . Sexual activity: Not on file  Lifestyle  . Physical activity    Days per week: Not on file    Minutes per session: Not on file  . Stress: Not on file  Relationships  . Social Herbalist on phone: Not on file    Gets together: Not on file    Attends religious service: Not on file    Active member of club or organization: Not on file    Attends meetings of clubs or organizations: Not on file    Relationship status: Not on file  . Intimate partner violence    Fear of current or ex partner: Not on file    Emotionally abused: Not on file    Physically abused: Not on file    Forced sexual activity: Not on file  Other Topics Concern  . Not on file  Social History Narrative  . Not on file     BP 140/72   Pulse (!) 105   Ht 5\' 2"  (1.575 m)   Wt 203 lb (92.1 kg)   LMP 10/10/2013   SpO2 98%   BMI 37.13 kg/m   Physical Exam:  Well appearing elderly woman, looks younger than her age, NAD HEENT: Unremarkable Neck:  6 cm JVD, no thyromegally Lymphatics:  No adenopathy Back:  No CVA tenderness Lungs:  Clear with no wheezes HEART:  Regular rate rhythm, no murmurs, no rubs, no clicks Abd:  soft, positive bowel sounds, no organomegally, no rebound, no guarding Ext:  2 plus pulses, no edema, no cyanosis, no clubbing Skin:  No rashes no nodules Neuro:  CN II through XII intact, motor grossly intact  EKG - atrial fib with ventricular pacing  DEVICE  Normal device function.  See PaceArt for details.   Assess/Plan: 1. Atrial fib - her rates are well controlled. She is asymptomatic. She will continue her current meds. 2. PPM - her medtronic Biv PPM is working normally. We will recheck in several months. 3. Obesity - she is  encouraged to lose weight.  4. Chronic systolic heart failure - her symptoms are class 2. She will continue her current meds and maintain a low sodium diet.  Mikle Bosworth.D.

## 2019-08-26 ENCOUNTER — Ambulatory Visit (INDEPENDENT_AMBULATORY_CARE_PROVIDER_SITE_OTHER): Payer: Medicare Other | Admitting: *Deleted

## 2019-08-26 DIAGNOSIS — I5032 Chronic diastolic (congestive) heart failure: Secondary | ICD-10-CM

## 2019-08-26 DIAGNOSIS — I4821 Permanent atrial fibrillation: Secondary | ICD-10-CM

## 2019-08-28 LAB — CUP PACEART REMOTE DEVICE CHECK
Battery Remaining Longevity: 52 mo
Battery Voltage: 2.98 V
Brady Statistic AP VP Percent: 0 %
Brady Statistic AP VS Percent: 0 %
Brady Statistic AS VP Percent: 0 %
Brady Statistic AS VS Percent: 0 %
Brady Statistic RA Percent Paced: 0 %
Brady Statistic RV Percent Paced: 98.73 %
Date Time Interrogation Session: 20201010195159
Implantable Lead Implant Date: 20030908
Implantable Lead Implant Date: 20030908
Implantable Lead Implant Date: 20071114
Implantable Lead Location: 753858
Implantable Lead Location: 753859
Implantable Lead Location: 753860
Implantable Lead Model: 4194
Implantable Lead Model: 5076
Implantable Lead Model: 5076
Implantable Pulse Generator Implant Date: 20160127
Lead Channel Impedance Value: 304 Ohm
Lead Channel Impedance Value: 323 Ohm
Lead Channel Impedance Value: 342 Ohm
Lead Channel Impedance Value: 399 Ohm
Lead Channel Impedance Value: 437 Ohm
Lead Channel Impedance Value: 456 Ohm
Lead Channel Impedance Value: 475 Ohm
Lead Channel Impedance Value: 475 Ohm
Lead Channel Impedance Value: 494 Ohm
Lead Channel Pacing Threshold Amplitude: 0.5 V
Lead Channel Pacing Threshold Pulse Width: 0.4 ms
Lead Channel Sensing Intrinsic Amplitude: 0.25 mV
Lead Channel Sensing Intrinsic Amplitude: 0.375 mV
Lead Channel Sensing Intrinsic Amplitude: 25.75 mV
Lead Channel Sensing Intrinsic Amplitude: 7.5 mV
Lead Channel Setting Pacing Amplitude: 1.75 V
Lead Channel Setting Pacing Amplitude: 2.5 V
Lead Channel Setting Pacing Pulse Width: 0.4 ms
Lead Channel Setting Pacing Pulse Width: 0.6 ms
Lead Channel Setting Sensing Sensitivity: 5.6 mV

## 2019-09-06 NOTE — Progress Notes (Signed)
Remote pacemaker transmission.   

## 2019-10-17 ENCOUNTER — Telehealth: Payer: Self-pay

## 2019-10-18 ENCOUNTER — Encounter: Payer: Self-pay | Admitting: Cardiology

## 2019-10-18 ENCOUNTER — Telehealth (INDEPENDENT_AMBULATORY_CARE_PROVIDER_SITE_OTHER): Payer: Medicare Other | Admitting: Cardiology

## 2019-10-18 ENCOUNTER — Other Ambulatory Visit: Payer: Self-pay

## 2019-10-18 VITALS — BP 120/57 | HR 77 | Ht 62.0 in | Wt 200.0 lb

## 2019-10-18 DIAGNOSIS — N1831 Chronic kidney disease, stage 3a: Secondary | ICD-10-CM

## 2019-10-18 DIAGNOSIS — I4729 Other ventricular tachycardia: Secondary | ICD-10-CM

## 2019-10-18 DIAGNOSIS — I951 Orthostatic hypotension: Secondary | ICD-10-CM

## 2019-10-18 DIAGNOSIS — I5032 Chronic diastolic (congestive) heart failure: Secondary | ICD-10-CM

## 2019-10-18 DIAGNOSIS — I472 Ventricular tachycardia: Secondary | ICD-10-CM

## 2019-10-18 DIAGNOSIS — I4821 Permanent atrial fibrillation: Secondary | ICD-10-CM | POA: Diagnosis not present

## 2019-10-18 DIAGNOSIS — I050 Rheumatic mitral stenosis: Secondary | ICD-10-CM

## 2019-10-18 DIAGNOSIS — I059 Rheumatic mitral valve disease, unspecified: Secondary | ICD-10-CM

## 2019-10-18 NOTE — Progress Notes (Signed)
Virtual Visit via Telephone Note   This visit type was conducted due to national recommendations for restrictions regarding the COVID-19 Pandemic (e.g. social distancing) in an effort to limit this patient's exposure and mitigate transmission in our community.  Due to her co-morbid illnesses, this patient is at least at moderate risk for complications without adequate follow up.  This format is felt to be most appropriate for this patient at this time.  The patient did not have access to video technology/had technical difficulties with video requiring transitioning to audio format only (telephone).  All issues noted in this document were discussed and addressed.  No physical exam could be performed with this format.  Please refer to the patient's chart for her  consent to telehealth for [facility].   Evaluation Performed:  Follow-up visit  This visit type was conducted due to national recommendations for restrictions regarding the COVID-19 Pandemic (e.g. social distancing).  This format is felt to be most appropriate for this patient at this time.  All issues noted in this document were discussed and addressed.  No physical exam was performed (except for noted visual exam findings with Video Visits).  Please refer to the patient's chart (MyChart message for video visits and phone note for telephone visits) for the patient's consent to telehealth for [facility].  Date:  10/18/2019   ID:  [name], DOB [birth year], MRN [id number]  Patient Location:  Home  Provider location:   [place]  PCP:  [name], MD  Cardiologist:  [name], MD  Electrophysiologist:  None   Chief Complaint:  MR/MS, permanent atrial fibrillation, CHF  History of Present Illness:    [name] is a 85 y.o. female who presents via audio/video conferencing for a telehealth visit today.    [name] a 85 y.o.femalewith a hx of chronic combined systolic/diastolic CHF (EF 09-32% by  echo 04/2016), tachy brady syndrome (s/p BiV PPM insertion), severe MR s/p MV repair in 2003 (mild to moderate MS on echo 04/2016), Stage 3 CKD, chronic atrial fibrillation (not on anticoagulation secondary to GIB), hypotension (on midodrine),and moderate pulmonary HTN. Most recentechocardiogram, 04/2016,showed an improved EF of 50-55% with mild to moderate mitral stenosis. Due to hypotension she isunable to tolerate BB or ACE- I. She has a hx of syncope related to orthostasis and dehydration and is on Midodrine.  She is here today for followup and is doing well.  She denies any chest pain or pressure, SOB, DOE, PND, orthopnea, LE edema, dizziness, palpitations or syncope. She is compliant with her meds and is tolerating meds with no SE.    The patient does not have symptoms concerning for COVID-19 infection (fever, chills, cough, or new shortness of breath).    Prior CV studies:   The following studies were reviewed today:  none  Past Medical History:  Diagnosis Date  . Anemia   . Brady-tachy syndrome ([place])    a. 2003: post-op afib after MR repair then symptomatic bradycardia requiring pacemaker. b. Upgrade to Medtronic Bi-V Pacemaker 2007.  . Cellulitis and abscess of foot 08/15/2015   RT FOOT  . Chronic atrial fibrillation ([place])    a. Previously failed DCCV/amio/tikosyn.  CHADS2VASC score 5 - not on anticoagulation due to history of GI bleeding.    . Chronic combined systolic and diastolic CHF (congestive heart failure) (HCC)    a. EF 35-40% on echo 09/2013 b. echo 04/2016: EF 50-55% with mild to moderate MS.  . Cirrhosis ([place])  a. Newly recognized 09/2013 - by CT.  . CKD (chronic kidney disease)   . GI bleeding   . Hypertension   . Mitral valve disorder    a. Severe MR s/p repair 2003 (28 mm annuloplasty ring and and oversew of LAA). No CAD by cath at that time.  [name] NSVT (nonsustained ventricular tachycardia) ([place])    a. Isolated event during 09/2007 adm.  . Orthostatic  hypotension dysautonomic syndrome ([place])   . Pulmonary hypertension (HCC)    Group II secondary to CHF and MV disease  . Spinal stenosis    shes recieved epidural steroid injections in the past  . Tricuspid regurgitation    Past Surgical History:  Procedure Laterality Date  . APPENDECTOMY    . BIV PACEMAKER GENERATOR CHANGE OUT N/A 12/13/2014   Procedure: BIV PACEMAKER GENERATOR CHANGE OUT;  Surgeon: Evans Lance, MD;  Location: [facility] CATH LAB;  Service: Cardiovascular;  Laterality: N/A;  . CHOLECYSTECTOMY    . IRRIGATION AND DEBRIDEMENT ABSCESS N/A 10/14/2013   Procedure: IRRIGATION AND DEBRIDEMENT PERINEAL ABSCESS;  Surgeon: Rolm Bookbinder, MD;  Location: [facility];  Service: General;  Laterality: N/A;  . VALVE REPLACEMENT     sever mitral regurgitation s/p mitral valve annuloplasty ring     Current Meds  Medication Sig  . acetaminophen (TYLENOL) 500 MG tablet Take 1,000 mg by mouth every 6 (six) hours as needed (pain).  . Febuxostat 80 MG TABS Take 80 mg by mouth daily.  . ferrous sulfate 325 (65 FE) MG tablet Take 1 tablet (325 mg total) by mouth 2 (two) times daily with a meal.  . midodrine (PROAMATINE) 2.5 MG tablet Take 1 tablet (2.5 mg total) by mouth 3 (three) times daily with meals. Please keep upcoming appt with Dr. Lovena Le in October for future refills. Thank you  . Multiple Vitamin (MULTIVITAMIN WITH MINERALS) TABS tablet Take 1 tablet by mouth daily.  . Multiple Vitamins-Minerals (PRESERVISION AREDS 2 PO) Take 2 tablets by mouth daily.  . potassium chloride SA (K-DUR) 20 MEQ tablet TAKE 1 TABLET BY MOUTH TWO  TIMES DAILY  . torsemide (DEMADEX) 20 MG tablet TAKE 1 TABLET BY MOUTH 2  TIMES DAILY. TAKE EXTRA  TABLET IF WEIGHT GAIN  GREATER THAN 3 LBS IN 1 DAY     Allergies:   Patient has no known allergies.   Social History   Tobacco Use  . Smoking status: Former Smoker    Quit date: 10/11/1971    Years since quitting: 48.0  . Smokeless tobacco: Never Used  Substance Use  Topics  . Alcohol use: Yes    Comment: rare  . Drug use: No     Family Hx: [name] family history includes Other in her mother; Pancreatic cancer in her father.  ROS:   Please see the history of present illness.     All other systems reviewed and are negative.   Labs/Other Tests and Data Reviewed:    Recent Labs: 05/30/2019: BUN 25; Creatinine, Ser 1.52; Potassium 4.6; Sodium 146   Recent Lipid Panel No results found for: CHOL, TRIG, HDL, CHOLHDL, LDLCALC, LDLDIRECT  Wt Readings from Last 3 Encounters:  10/18/19 200 lb (90.7 kg)  08/24/19 203 lb (92.1 kg)  04/14/19 208 lb (94.3 kg)     Objective:    Vital Signs:  BP (!) 120/57   Pulse 77   Ht 5\' 2"  (1.575 m)   Wt 200 lb (90.7 kg)   LMP 10/10/2013   SpO2 97%  BMI 36.58 kg/m     ASSESSMENT & PLAN:    1.  Permanent atrial fibrillation  -she denies any palpitations and HR is controlled -not a candidate for longterm anticoagulation due to hx of GI bleed -not on BB or CCB due to hx of orthostatic hypotension and HR has been adequately controled    2.  Chronic diastolic CHF  -she has been well compensated recently -denies any SOB or LE edema -her weight has decreased further and now down to 200lbs -she is feeling very good and trying to lose more weight  -continue Demadex 20mg  BID  -Creatinine was stable at 1.41 and K=+ 4.6 in July  3.  Orthostatic hypotension  -she has a remote history of syncope in the setting of dehydration.   -she is on Mididrin and has not had any dizziness.  4.  Nonsustained ventricular tachycardia  -she denies any palpitations - EF 50 to 55% on echo 2017.    5.  Severe mitral regurgitation  -s/p mitral valve repair in 2003.   -Her last echo was in 2017 showing no evidence of MR with mild to moderate mitral stenosis and mean mitral valve gradient 5 mmHg.   -She is completely asymptomatic.   -Given her advanced age and the current current situation I am not going to repeat  an echo at this time.   -She knows to call me if she develops any shortness of breath from her baseline or lower extremity edema.  6.  Chronic kidney disease stage III -her creatinine was 1.86 in January 2020 and now improved to 1.41   COVID-19 Education: The signs and symptoms of COVID-19 were discussed with the patient and how to seek care for testing (follow up with PCP or arrange E-visit).  The importance of social distancing was discussed today.  Patient Risk:   After full review of this patient's clinical status, I feel that they are at least moderate risk at this time.  Time:   Today, I have spent 20 minutes directly with the patient on telemdicine discussing medical problems including CHF, MV disease, CKD, atrial fibrillation, HTN.  We also reviewed the symptoms of COVID 19 and the ways to protect against contracting the virus with telehealth technology.  I spent an additional 5 minutes reviewing patient's chart including labs.  Medication Adjustments/Labs and Tests Ordered: Current medicines are reviewed at length with the patient today.  Concerns regarding medicines are outlined above.  Tests Ordered: No orders of the defined types were placed in this encounter.  Medication Changes: No orders of the defined types were placed in this encounter.   Disposition:  Follow up in 6 month(s)  Signed, [name], MD  10/18/2019 3:12 PM    [place]

## 2019-10-18 NOTE — Patient Instructions (Signed)
Medication Instructions:   Your physician recommends that you continue on your current medications as directed. Please refer to the Current Medication list given to you today.  *If you need a refill on your cardiac medications before your next appointment, please call your pharmacy*  Lab Work: None ordered.   Testing/Procedures: Your physician has requested that you have an echocardiogram. Echocardiography is a painless test that uses sound waves to create images of your heart. It provides your doctor with information about the size and shape of your heart and how well your heart's chambers and valves are working. This procedure takes approximately one hour. There are no restrictions for this procedure.  Follow-Up: At [facility], you and your health needs are our priority.  As part of our continuing mission to provide you with exceptional heart care, we have created designated Provider Care Teams.  These Care Teams include your primary Cardiologist (physician) and Advanced Practice Providers (APPs -  Physician Assistants and Nurse Practitioners) who all work together to provide you with the care you need, when you need it.  Your next appointment:   6 month(s)  The format for your next appointment:   In Person  Provider:   [name], MD

## 2019-11-06 ENCOUNTER — Other Ambulatory Visit: Payer: Self-pay | Admitting: Internal Medicine

## 2019-11-25 ENCOUNTER — Ambulatory Visit (INDEPENDENT_AMBULATORY_CARE_PROVIDER_SITE_OTHER): Payer: Medicare Other | Admitting: *Deleted

## 2019-11-25 DIAGNOSIS — I5032 Chronic diastolic (congestive) heart failure: Secondary | ICD-10-CM | POA: Diagnosis not present

## 2019-11-25 LAB — CUP PACEART REMOTE DEVICE CHECK
Battery Remaining Longevity: 48 mo
Battery Voltage: 2.98 V
Brady Statistic AP VP Percent: 0 %
Brady Statistic AP VS Percent: 0 %
Brady Statistic AS VP Percent: 0 %
Brady Statistic AS VS Percent: 0 %
Brady Statistic RA Percent Paced: 0 %
Brady Statistic RV Percent Paced: 97.33 %
Date Time Interrogation Session: 20210108145530
Implantable Lead Implant Date: 20030908
Implantable Lead Implant Date: 20030908
Implantable Lead Implant Date: 20071114
Implantable Lead Location: 753858
Implantable Lead Location: 753859
Implantable Lead Location: 753860
Implantable Lead Model: 4194
Implantable Lead Model: 5076
Implantable Lead Model: 5076
Implantable Pulse Generator Implant Date: 20160127
Lead Channel Impedance Value: 304 Ohm
Lead Channel Impedance Value: 323 Ohm
Lead Channel Impedance Value: 342 Ohm
Lead Channel Impedance Value: 418 Ohm
Lead Channel Impedance Value: 475 Ohm
Lead Channel Impedance Value: 475 Ohm
Lead Channel Impedance Value: 494 Ohm
Lead Channel Impedance Value: 494 Ohm
Lead Channel Impedance Value: 513 Ohm
Lead Channel Pacing Threshold Amplitude: 0.5 V
Lead Channel Pacing Threshold Pulse Width: 0.4 ms
Lead Channel Sensing Intrinsic Amplitude: 0.25 mV
Lead Channel Sensing Intrinsic Amplitude: 0.375 mV
Lead Channel Sensing Intrinsic Amplitude: 8.5 mV
Lead Channel Sensing Intrinsic Amplitude: 8.5 mV
Lead Channel Setting Pacing Amplitude: 1.75 V
Lead Channel Setting Pacing Amplitude: 2.5 V
Lead Channel Setting Pacing Pulse Width: 0.4 ms
Lead Channel Setting Pacing Pulse Width: 0.6 ms
Lead Channel Setting Sensing Sensitivity: 5.6 mV

## 2019-12-01 ENCOUNTER — Other Ambulatory Visit: Payer: Self-pay

## 2019-12-01 ENCOUNTER — Ambulatory Visit (HOSPITAL_COMMUNITY): Payer: Medicare Other | Attending: Cardiology

## 2019-12-01 DIAGNOSIS — I050 Rheumatic mitral stenosis: Secondary | ICD-10-CM | POA: Diagnosis present

## 2019-12-01 MED ORDER — PERFLUTREN LIPID MICROSPHERE
1.0000 mL | INTRAVENOUS | Status: AC | PRN
  Administered 2019-12-01: 2 mL via INTRAVENOUS

## 2020-02-21 ENCOUNTER — Other Ambulatory Visit: Payer: Self-pay | Admitting: Cardiology

## 2020-02-22 MED ORDER — POTASSIUM CHLORIDE CRYS ER 20 MEQ PO TBCR: 20.0000 meq | EXTENDED_RELEASE_TABLET | Freq: Two times a day (BID) | ORAL | 2 refills | Status: DC

## 2020-03-09 ENCOUNTER — Ambulatory Visit (INDEPENDENT_AMBULATORY_CARE_PROVIDER_SITE_OTHER): Payer: Medicare Other | Admitting: *Deleted

## 2020-03-09 DIAGNOSIS — I5032 Chronic diastolic (congestive) heart failure: Secondary | ICD-10-CM | POA: Diagnosis not present

## 2020-03-12 LAB — CUP PACEART REMOTE DEVICE CHECK
Battery Remaining Longevity: 47 mo
Battery Voltage: 2.98 V
Brady Statistic AP VP Percent: 0 %
Brady Statistic AP VS Percent: 0 %
Brady Statistic AS VP Percent: 0 %
Brady Statistic AS VS Percent: 0 %
Brady Statistic RA Percent Paced: 0 %
Brady Statistic RV Percent Paced: 96.38 %
Date Time Interrogation Session: 20210423203509
Implantable Lead Implant Date: 20030908
Implantable Lead Implant Date: 20030908
Implantable Lead Implant Date: 20071114
Implantable Lead Location: 753858
Implantable Lead Location: 753859
Implantable Lead Location: 753860
Implantable Lead Model: 4194
Implantable Lead Model: 5076
Implantable Lead Model: 5076
Implantable Pulse Generator Implant Date: 20160127
Lead Channel Impedance Value: 304 Ohm
Lead Channel Impedance Value: 323 Ohm
Lead Channel Impedance Value: 342 Ohm
Lead Channel Impedance Value: 399 Ohm
Lead Channel Impedance Value: 475 Ohm
Lead Channel Impedance Value: 494 Ohm
Lead Channel Impedance Value: 513 Ohm
Lead Channel Impedance Value: 570 Ohm
Lead Channel Impedance Value: 665 Ohm
Lead Channel Pacing Threshold Amplitude: 0.625 V
Lead Channel Pacing Threshold Pulse Width: 0.4 ms
Lead Channel Sensing Intrinsic Amplitude: 0.25 mV
Lead Channel Sensing Intrinsic Amplitude: 0.375 mV
Lead Channel Sensing Intrinsic Amplitude: 8.75 mV
Lead Channel Sensing Intrinsic Amplitude: 8.75 mV
Lead Channel Setting Pacing Amplitude: 1.75 V
Lead Channel Setting Pacing Amplitude: 2.5 V
Lead Channel Setting Pacing Pulse Width: 0.4 ms
Lead Channel Setting Pacing Pulse Width: 0.6 ms
Lead Channel Setting Sensing Sensitivity: 5.6 mV

## 2020-03-12 NOTE — Progress Notes (Signed)
PPM Remote  

## 2020-03-21 ENCOUNTER — Telehealth: Payer: Self-pay

## 2020-03-21 NOTE — Telephone Encounter (Signed)
K-tab would be fine

## 2020-03-21 NOTE — Telephone Encounter (Signed)
Called [facility] and spoke with the pharmacist informing them that what pt could take for the alternative per kristin [place], [place]. Pharmacist verbalized understanding.

## 2020-03-21 NOTE — Telephone Encounter (Signed)
[facility] mail order pharmacy requesting a alternative for potassium Chloride (Klor-Con) 20 mEq, this medication is unavailable. The medications that is available are Potassium CI crystals/particles form (generic for Klor con M) pt history and Potassium CL wax Matrix form (generic for K-tab). Order# [id number] PH# [phone]. Please clarify which medication Dr. Radford Pax would like for pt to take for the alternative. Thank you

## 2020-03-21 NOTE — Telephone Encounter (Signed)
DO you mind asking one of the PharmDs which one is easier for patient to tolerate

## 2020-04-04 ENCOUNTER — Inpatient Hospital Stay (HOSPITAL_COMMUNITY)
Admission: EM | Admit: 2020-04-04 | Discharge: 2020-04-07 | DRG: 312 | Disposition: A | Discharge status: Skilled Nursing Facility | Payer: Medicare Other | Attending: Internal Medicine | Admitting: Internal Medicine

## 2020-04-04 ENCOUNTER — Telehealth: Payer: Self-pay | Admitting: Cardiology

## 2020-04-04 ENCOUNTER — Encounter (HOSPITAL_COMMUNITY): Payer: Self-pay | Admitting: Emergency Medicine

## 2020-04-04 ENCOUNTER — Emergency Department (HOSPITAL_COMMUNITY): Payer: Medicare Other

## 2020-04-04 ENCOUNTER — Other Ambulatory Visit: Payer: Self-pay

## 2020-04-04 DIAGNOSIS — I4821 Permanent atrial fibrillation: Secondary | ICD-10-CM | POA: Diagnosis present

## 2020-04-04 DIAGNOSIS — Z800 Family history of malignant neoplasm of digestive organs: Secondary | ICD-10-CM

## 2020-04-04 DIAGNOSIS — D631 Anemia in chronic kidney disease: Secondary | ICD-10-CM | POA: Diagnosis present

## 2020-04-04 DIAGNOSIS — Z79899 Other long term (current) drug therapy: Secondary | ICD-10-CM

## 2020-04-04 DIAGNOSIS — I10 Essential (primary) hypertension: Secondary | ICD-10-CM | POA: Diagnosis present

## 2020-04-04 DIAGNOSIS — K7460 Unspecified cirrhosis of liver: Secondary | ICD-10-CM | POA: Diagnosis present

## 2020-04-04 DIAGNOSIS — I4729 Other ventricular tachycardia: Secondary | ICD-10-CM

## 2020-04-04 DIAGNOSIS — Z20822 Contact with and (suspected) exposure to covid-19: Secondary | ICD-10-CM | POA: Diagnosis present

## 2020-04-04 DIAGNOSIS — I951 Orthostatic hypotension: Secondary | ICD-10-CM | POA: Diagnosis not present

## 2020-04-04 DIAGNOSIS — R55 Syncope and collapse: Secondary | ICD-10-CM

## 2020-04-04 DIAGNOSIS — D649 Anemia, unspecified: Secondary | ICD-10-CM | POA: Diagnosis present

## 2020-04-04 DIAGNOSIS — I428 Other cardiomyopathies: Secondary | ICD-10-CM | POA: Diagnosis present

## 2020-04-04 DIAGNOSIS — E785 Hyperlipidemia, unspecified: Secondary | ICD-10-CM | POA: Diagnosis present

## 2020-04-04 DIAGNOSIS — N184 Chronic kidney disease, stage 4 (severe): Secondary | ICD-10-CM | POA: Diagnosis present

## 2020-04-04 DIAGNOSIS — R112 Nausea with vomiting, unspecified: Secondary | ICD-10-CM | POA: Diagnosis present

## 2020-04-04 DIAGNOSIS — I5042 Chronic combined systolic (congestive) and diastolic (congestive) heart failure: Secondary | ICD-10-CM | POA: Diagnosis present

## 2020-04-04 DIAGNOSIS — N1831 Chronic kidney disease, stage 3a: Secondary | ICD-10-CM | POA: Diagnosis present

## 2020-04-04 DIAGNOSIS — Z950 Presence of cardiac pacemaker: Secondary | ICD-10-CM | POA: Diagnosis present

## 2020-04-04 DIAGNOSIS — I2721 Secondary pulmonary arterial hypertension: Secondary | ICD-10-CM | POA: Diagnosis present

## 2020-04-04 DIAGNOSIS — I442 Atrioventricular block, complete: Secondary | ICD-10-CM | POA: Diagnosis present

## 2020-04-04 DIAGNOSIS — I5032 Chronic diastolic (congestive) heart failure: Secondary | ICD-10-CM | POA: Diagnosis present

## 2020-04-04 DIAGNOSIS — I130 Hypertensive heart and chronic kidney disease with heart failure and stage 1 through stage 4 chronic kidney disease, or unspecified chronic kidney disease: Secondary | ICD-10-CM | POA: Diagnosis present

## 2020-04-04 DIAGNOSIS — I472 Ventricular tachycardia: Secondary | ICD-10-CM | POA: Diagnosis present

## 2020-04-04 DIAGNOSIS — Z952 Presence of prosthetic heart valve: Secondary | ICD-10-CM

## 2020-04-04 DIAGNOSIS — I495 Sick sinus syndrome: Secondary | ICD-10-CM | POA: Diagnosis present

## 2020-04-04 DIAGNOSIS — Z87891 Personal history of nicotine dependence: Secondary | ICD-10-CM

## 2020-04-04 HISTORY — DX: Syncope and collapse: R55

## 2020-04-04 LAB — CBC
HCT: 32.2 % — ABNORMAL LOW (ref 36.0–46.0)
Hemoglobin: 9.3 g/dL — ABNORMAL LOW (ref 12.0–15.0)
MCH: 25.8 pg — ABNORMAL LOW (ref 26.0–34.0)
MCHC: 28.9 g/dL — ABNORMAL LOW (ref 30.0–36.0)
MCV: 89.2 fL (ref 80.0–100.0)
Platelets: 188 10*3/uL (ref 150–400)
RBC: 3.61 MIL/uL — ABNORMAL LOW (ref 3.87–5.11)
RDW: 16.1 % — ABNORMAL HIGH (ref 11.5–15.5)
WBC: 7.6 10*3/uL (ref 4.0–10.5)
nRBC: 0 % (ref 0.0–0.2)

## 2020-04-04 LAB — BASIC METABOLIC PANEL
Anion gap: 9 (ref 5–15)
BUN: 30 mg/dL — ABNORMAL HIGH (ref 8–23)
CO2: 23 mmol/L (ref 22–32)
Calcium: 8.2 mg/dL — ABNORMAL LOW (ref 8.9–10.3)
Chloride: 113 mmol/L — ABNORMAL HIGH (ref 98–111)
Creatinine, Ser: 1.8 mg/dL — ABNORMAL HIGH (ref 0.44–1.00)
GFR calc Af Amer: 28 mL/min — ABNORMAL LOW (ref >60)
GFR calc non Af Amer: 24 mL/min — ABNORMAL LOW (ref >60)
Glucose, Bld: 120 mg/dL — ABNORMAL HIGH (ref 70–99)
Potassium: 3.6 mmol/L (ref 3.5–5.1)
Sodium: 145 mmol/L (ref 135–145)

## 2020-04-04 LAB — HEPATIC FUNCTION PANEL
ALT: 12 U/L (ref 0–44)
AST: 19 U/L (ref 15–41)
Albumin: 3 g/dL — ABNORMAL LOW (ref 3.5–5.0)
Alkaline Phosphatase: 82 U/L (ref 38–126)
Bilirubin, Direct: 0.1 mg/dL (ref 0.0–0.2)
Indirect Bilirubin: 0.8 mg/dL (ref 0.3–0.9)
Total Bilirubin: 0.9 mg/dL (ref 0.3–1.2)
Total Protein: 6.1 g/dL — ABNORMAL LOW (ref 6.5–8.1)

## 2020-04-04 LAB — BRAIN NATRIURETIC PEPTIDE: B Natriuretic Peptide: 162.5 pg/mL — ABNORMAL HIGH (ref 0.0–100.0)

## 2020-04-04 LAB — SARS CORONAVIRUS 2 BY RT PCR (HOSPITAL ORDER, PERFORMED IN ~~LOC~~ HOSPITAL LAB): SARS Coronavirus 2: NEGATIVE

## 2020-04-04 LAB — TSH: TSH: 2.771 u[IU]/mL (ref 0.350–4.500)

## 2020-04-04 LAB — CBG MONITORING, ED: Glucose-Capillary: 88 mg/dL (ref 70–99)

## 2020-04-04 LAB — TROPONIN I (HIGH SENSITIVITY)
Troponin I (High Sensitivity): 19 ng/L — ABNORMAL HIGH (ref <18)
Troponin I (High Sensitivity): 19 ng/L — ABNORMAL HIGH (ref <18)

## 2020-04-04 MED ORDER — ONDANSETRON HCL 4 MG/2ML IJ SOLN: 4.0000 mg | Freq: Four times a day (QID) | INTRAMUSCULAR | Status: DC | PRN

## 2020-04-04 MED ORDER — SODIUM CHLORIDE 0.9% FLUSH
3.0000 mL | Freq: Two times a day (BID) | INTRAVENOUS | Status: DC
  Administered 2020-04-04 – 2020-04-07 (×6): 3 mL via INTRAVENOUS

## 2020-04-04 MED ORDER — SODIUM CHLORIDE 0.9% FLUSH
3.0000 mL | Freq: Once | INTRAVENOUS | Status: AC
  Administered 2020-04-04: 3 mL via INTRAVENOUS

## 2020-04-04 MED ORDER — ONDANSETRON HCL 4 MG PO TABS: 4.0000 mg | ORAL_TABLET | Freq: Four times a day (QID) | ORAL | Status: DC | PRN

## 2020-04-04 NOTE — Telephone Encounter (Signed)
Dtr calls in to report mom passed out this morning.  Pt was going in to the kitchen to get breakfast when incident occurred.  Grandson found pt sitting on her bottom, propped up by the cabinets..... they state they do not believe pt hit her head.  Dtr also observed pt have an episode last night when she turned pale and had funny look in her eyes.  Dtr got pt to sit immediately and pt improved.  After 20 min BP 112/53 She report pt has hx with syncope & low BPs, and voices that pt is not dehydrated. Aware I will discuss w/ Dr. Radford Pax and call them back w/ advisement. Dtr is agreeable to plan.

## 2020-04-04 NOTE — H&P (Signed)
History and Physical   [name] DOB: 10-31-1926 DOA: 04/04/2020  Referring MD/NP/PA: Dr. Sedonia Small  PCP: [name], MD   Outpatient Specialists: [facility] health cardiology group  Patient coming from: Home  Chief Complaint: Passing out  HPI: [name] is a 86 y.o. female with medical history significant of orthostatic hypotension, paroxysmal atrial fibrillation, hypertension, hyperlipidemia, who was apparently getting breakfast today became hypotensive orthostatic and failed.  She passed out.  Patient has been off blood thinners.  She did not hit her head.  She called the cardiologist office and was told to come to the ER.  Her fall was observed.  Patient has had 3 episodes in the last week.  She is taking medications for possible orthostasis.  Denies any fever or chills denied any nausea vomiting or diarrhea.  Denied any chest pain.  Symptoms occurred suddenly without any warning.  In the ER work-up so far is nonrevealing.  She is being admitted for full work-up of syncope..  ED Course: Temperature is 98.3 blood pressure 128/54 pulse is 90 respiratory 28 oxygen sat 98% room air.  White count 7.6 hemoglobin 9.3 and platelets 188.  Sodium 145 potassium 3.6 chloride 113 CO2 23 BUN 30 creatinine 1.80 and calcium 8.2.  EKG showed paced rhythm with no significant findings patient being admitted for work-up of syncopal episode.  Review of Systems: As per HPI otherwise 10 point review of systems negative.    Past Medical History:  Diagnosis Date  . Anemia   . Brady-tachy syndrome ([place])    a. 2003: post-op afib after MR repair then symptomatic bradycardia requiring pacemaker. b. Upgrade to Medtronic Bi-V Pacemaker 2007.  . Cellulitis and abscess of foot 08/15/2015   RT FOOT  . Chronic atrial fibrillation ([place])    a. Previously failed DCCV/amio/tikosyn.  CHADS2VASC score 5 - not on anticoagulation due to history of GI bleeding.    . Chronic combined systolic and diastolic  CHF (congestive heart failure) (HCC)    a. EF 35-40% on echo 09/2013 b. echo 04/2016: EF 50-55% with mild to moderate MS.  . Cirrhosis ([place])    a. Newly recognized 09/2013 - by CT.  . CKD (chronic kidney disease)   . GI bleeding   . Hypertension   . Mitral valve disorder    a. Severe MR s/p repair 2003 (28 mm annuloplasty ring and and oversew of LAA). No CAD by cath at that time.  [name] NSVT (nonsustained ventricular tachycardia) ([place])    a. Isolated event during 09/2007 adm.  . Orthostatic hypotension dysautonomic syndrome ([place])   . Pulmonary hypertension (HCC)    Group II secondary to CHF and MV disease  . Spinal stenosis    shes recieved epidural steroid injections in the past  . Tricuspid regurgitation     Past Surgical History:  Procedure Laterality Date  . APPENDECTOMY    . BIV PACEMAKER GENERATOR CHANGE OUT N/A 12/13/2014   Procedure: BIV PACEMAKER GENERATOR CHANGE OUT;  Surgeon: Evans Lance, MD;  Location: [facility] CATH LAB;  Service: Cardiovascular;  Laterality: N/A;  . CHOLECYSTECTOMY    . IRRIGATION AND DEBRIDEMENT ABSCESS N/A 10/14/2013   Procedure: IRRIGATION AND DEBRIDEMENT PERINEAL ABSCESS;  Surgeon: Rolm Bookbinder, MD;  Location: [place];  Service: General;  Laterality: N/A;  . VALVE REPLACEMENT     sever mitral regurgitation s/p mitral valve annuloplasty ring     reports that she quit smoking about 48 years ago. She has never used smokeless tobacco.  She reports current alcohol use. She reports that she does not use drugs.  No Known Allergies  Family History  Problem Relation Age of Onset  . Pancreatic cancer Father   . Other Mother        Mother died at 36 with no real medical problems     Prior to Admission medications   Medication Sig Start Date End Date Taking? Authorizing Provider  acetaminophen (TYLENOL) 500 MG tablet Take 1,000 mg by mouth every 6 (six) hours as needed (pain).    [provider]  Febuxostat 80 MG TABS Take 80 mg by mouth daily.     [provider]  ferrous sulfate 325 (65 FE) MG tablet Take 1 tablet (325 mg total) by mouth 2 (two) times daily with a meal. 05/22/16   Strader, [place] M, PA-C  midodrine (PROAMATINE) 2.5 MG tablet TAKE 1 TABLET BY MOUTH 3  TIMES DAILY WITH MEALS 11/07/19   Sueanne Margarita, MD  Multiple Vitamin (MULTIVITAMIN WITH MINERALS) TABS tablet Take 1 tablet by mouth daily.    [provider]  Multiple Vitamins-Minerals (PRESERVISION AREDS 2 PO) Take 2 tablets by mouth daily.    [provider]  potassium chloride SA (KLOR-CON) 20 MEQ tablet Take 1 tablet (20 mEq total) by mouth 2 (two) times daily. 02/22/20   Sueanne Margarita, MD  torsemide (DEMADEX) 20 MG tablet TAKE 1 TABLET BY MOUTH 2  TIMES DAILY. TAKE EXTRA  TABLET IF WEIGHT GAIN  GREATER THAN 3 LBS IN 1 DAY 06/02/19   Sueanne Margarita, MD    Physical Exam: Vitals:   04/04/20 1209 04/04/20 1801 04/04/20 2030 04/04/20 2045  BP: (!) 117/48 120/62 (!) 126/50 (!) 128/54  Pulse: 72 90 67 71  Resp: 16 16 (!) 28 (!) 23  Temp: 98.3 F (36.8 C)     TempSrc: Oral     SpO2: 100% 100% 98% 99%      Constitutional: NAD, calm, comfortable Vitals:   04/04/20 1209 04/04/20 1801 04/04/20 2030 04/04/20 2045  BP: (!) 117/48 120/62 (!) 126/50 (!) 128/54  Pulse: 72 90 67 71  Resp: 16 16 (!) 28 (!) 23  Temp: 98.3 F (36.8 C)     TempSrc: Oral     SpO2: 100% 100% 98% 99%   Eyes: PERRL, lids and conjunctivae normal ENMT: Mucous membranes are moist. Posterior pharynx clear of any exudate or lesions.Normal dentition.  Neck: normal, supple, no masses, no thyromegaly Respiratory: clear to auscultation bilaterally, no wheezing, no crackles. Normal respiratory effort. No accessory muscle use.  Cardiovascular: Regular rate and rhythm, no murmurs / rubs / gallops. No extremity edema. 2+ pedal pulses. No carotid bruits.  Abdomen: no tenderness, no masses palpated. No hepatosplenomegaly. Bowel sounds positive.  Musculoskeletal: no clubbing /  cyanosis. No joint deformity upper and lower extremities. Good ROM, no contractures. Normal muscle tone.  Skin: no rashes, lesions, ulcers. No induration Neurologic: CN 2-12 grossly intact. Sensation intact, DTR normal. Strength 5/5 in all 4.  Psychiatric: Normal judgment and insight. Alert and oriented x 3. Normal mood.     Labs on Admission: I have personally reviewed following labs and imaging studies  CBC: Recent Labs  Lab 04/04/20 1221  WBC 7.6  HGB 9.3*  HCT 32.2*  MCV 89.2  PLT 542   Basic Metabolic Panel: Recent Labs  Lab 04/04/20 1221  NA 145  K 3.6  CL 113*  CO2 23  GLUCOSE 120*  BUN 30*  CREATININE 1.80*  CALCIUM 8.2*   GFR: CrCl cannot be calculated (Unknown ideal weight.). Liver Function Tests: No results for input(s): AST, ALT, ALKPHOS, BILITOT, PROT, ALBUMIN in the last 168 hours. No results for input(s): LIPASE, AMYLASE in the last 168 hours. No results for input(s): AMMONIA in the last 168 hours. Coagulation Profile: No results for input(s): INR, PROTIME in the last 168 hours. Cardiac Enzymes: No results for input(s): CKTOTAL, CKMB, CKMBINDEX, TROPONINI in the last 168 hours. BNP (last 3 results) No results for input(s): PROBNP in the last 8760 hours. HbA1C: No results for input(s): HGBA1C in the last 72 hours. CBG: Recent Labs  Lab 04/04/20 2011  GLUCAP 88   Lipid Profile: No results for input(s): CHOL, HDL, LDLCALC, TRIG, CHOLHDL, LDLDIRECT in the last 72 hours. Thyroid Function Tests: No results for input(s): TSH, T4TOTAL, FREET4, T3FREE, THYROIDAB in the last 72 hours. Anemia Panel: No results for input(s): VITAMINB12, FOLATE, FERRITIN, TIBC, IRON, RETICCTPCT in the last 72 hours. Urine analysis:    Component Value Date/Time   COLORURINE YELLOW 10/06/2017 1755   APPEARANCEUR HAZY (A) 10/06/2017 1755   LABSPEC 1.015 10/06/2017 1755   PHURINE 5.0 10/06/2017 1755   GLUCOSEU NEGATIVE 10/06/2017 1755   HGBUR NEGATIVE 10/06/2017 [place] 10/06/2017 [place] 10/06/2017 1755   PROTEINUR NEGATIVE 10/06/2017 1755   UROBILINOGEN 0.2 11/27/2013 1423   NITRITE NEGATIVE 10/06/2017 1755   LEUKOCYTESUR NEGATIVE 10/06/2017 1755   Sepsis Labs: @LABRCNTIP (procalcitonin:4,lacticidven:4) )No results found for this or any previous visit (from the past 240 hour(s)).   Radiological Exams on Admission: DG Chest 2 View  Result Date: 04/04/2020 CLINICAL DATA:  Syncopal episode. EXAM: CHEST - 2 VIEW COMPARISON:  PA and lateral chest 05/18/2018. FINDINGS: Three lead pacing device is in place and unchanged. The patient is status post mitral valve repair. There is cardiomegaly. Atherosclerosis noted. Lungs clear. No pneumothorax or pleural effusion. IMPRESSION: Cardiomegaly without acute disease. Aortic Atherosclerosis (ICD10-I70.0). Electronically Signed   By: Inge Rise M.D.   On: 04/04/2020 13:11    EKG: Independently reviewed.  Showed paced rhythm with a rate of 60s no significant ST changes.  Assessment/Plan Principal Problem:   Syncope and collapse Active Problems:   CKD (chronic kidney disease) stage 3, GFR 30-59 ml/min   Permanent atrial fibrillation (HCC)   Chronic diastolic CHF (congestive heart failure) ([place])   Hypertension     #1 syncopal episode: Recurrent: May be related to cardiac causes.  Patient had echocardiogram about 4 months ago.  She is not to have previous arrhythmias include no orthostatic hypotension.  We will admit the patient.  Enzymes so far negative.  Placed on telemetric.  Echocardiogram.  EP consult in the morning.  #2 chronic diastolic CHF: Confirm and resume home regimen.  #3 paroxysmal atrial fibrillation: Patient has a pacemaker in place and rhythm is paced.  Pacemaker may be interrogated to see if it has something to do with the syncopal episode.  #4 chronic kidney disease stage III: Patient appears to be at baseline.  Continue monitoring.  #5 hypertension:  Continue home regimen.   DVT prophylaxis: Heparin Code Status: Full code Family Communication: No family at bedside Disposition Plan: To be determined Consults called: None but needs cardiology consult in the morning Admission status: Observation  Severity of Illness: The appropriate patient status for this patient is OBSERVATION. Observation status is judged to be reasonable and necessary in order to provide the required intensity of service to ensure the patient's  safety. [name] presenting symptoms, physical exam findings, and initial radiographic and laboratory data in the context of their medical condition is felt to place them at decreased risk for further clinical deterioration. Furthermore, it is anticipated that the patient will be medically stable for discharge from the hospital within 2 midnights of admission. The following factors support the patient status of observation.   " [name] presenting symptoms include syncope. " The physical exam findings include generalized weakness but no significant findings. " The initial radiographic and laboratory data are mostly within normal.     Rashod Gougeon,LAWAL MD [facility] Pager [phone]  If 7PM-7AM, please contact night-coverage [email or web address] Password [facility]  04/04/2020, 9:23 PM

## 2020-04-04 NOTE — Telephone Encounter (Signed)
New message  Pt c/o Syncope: STAT if syncope occurred within 30 minutes and pt complains of lightheadedness High Priority if episode of passing out, completely, today or in last 24 hours   1. Did you pass out today? Yes  2. When is the last time you passed out? This morning  3. Has this occurred multiple times? No   4. Did you have any symptoms prior to passing out? Spaces out, decrease in blood pressure,

## 2020-04-04 NOTE — ED Triage Notes (Signed)
Pt here from home with an syncopal episode while trying to get breakfast , pt was orthostatic at home , pt did not hit her head , no thinners

## 2020-04-04 NOTE — Telephone Encounter (Signed)
Advised dtr to take pt to ED per Dr. Radford Pax. Dtr agreeable to plan.

## 2020-04-04 NOTE — ED Provider Notes (Signed)
[facility] Emergency Department Provider Note MRN:  [id number]  Arrival date & time: 04/04/20     Chief Complaint   Syncope History of Present Illness   [name] is a 86 y.o. year-old female with a history of tachybradycardia syndrome, A. fib, CHF, cirrhosis, pulmonary artery hypertension presenting to the ED with chief complaint of syncope.  3 syncopal episodes over the past week.  1 occurred this morning while patient was standing in the kitchen waiting for her breakfast to warm up in the microwave.  Did not experience any dizziness or nausea or chest pain or any other warning symptoms prior to the syncopal episode.  Woke up on the ground.  Denies any recent leg pain or swelling, no shortness of breath, no fever or cough, no abdominal pain, denies any trauma from the fall.  Review of Systems  A complete 10 system review of systems was obtained and all systems are negative except as noted in the HPI and PMH.   Patient's Health History    Past Medical History:  Diagnosis Date  . Anemia   . Brady-tachy syndrome ([place])    a. 2003: post-op afib after MR repair then symptomatic bradycardia requiring pacemaker. b. Upgrade to Medtronic Bi-V Pacemaker 2007.  . Cellulitis and abscess of foot 08/15/2015   RT FOOT  . Chronic atrial fibrillation ([place])    a. Previously failed DCCV/amio/tikosyn.  CHADS2VASC score 5 - not on anticoagulation due to history of GI bleeding.    . Chronic combined systolic and diastolic CHF (congestive heart failure) (HCC)    a. EF 35-40% on echo 09/2013 b. echo 04/2016: EF 50-55% with mild to moderate MS.  . Cirrhosis ([place])    a. Newly recognized 09/2013 - by CT.  . CKD (chronic kidney disease)   . GI bleeding   . Hypertension   . Mitral valve disorder    a. Severe MR s/p repair 2003 (28 mm annuloplasty ring and and oversew of LAA). No CAD by cath at that time.  [name] NSVT (nonsustained ventricular tachycardia) ([place])    a. Isolated  event during 09/2007 adm.  . Orthostatic hypotension dysautonomic syndrome ([place])   . Pulmonary hypertension (HCC)    Group II secondary to CHF and MV disease  . Spinal stenosis    shes recieved epidural steroid injections in the past  . Tricuspid regurgitation     Past Surgical History:  Procedure Laterality Date  . APPENDECTOMY    . BIV PACEMAKER GENERATOR CHANGE OUT N/A 12/13/2014   Procedure: BIV PACEMAKER GENERATOR CHANGE OUT;  Surgeon: Evans Lance, MD;  Location: [facility] CATH LAB;  Service: Cardiovascular;  Laterality: N/A;  . CHOLECYSTECTOMY    . IRRIGATION AND DEBRIDEMENT ABSCESS N/A 10/14/2013   Procedure: IRRIGATION AND DEBRIDEMENT PERINEAL ABSCESS;  Surgeon: Rolm Bookbinder, MD;  Location: [place];  Service: General;  Laterality: N/A;  . VALVE REPLACEMENT     sever mitral regurgitation s/p mitral valve annuloplasty ring    Family History  Problem Relation Age of Onset  . Pancreatic cancer Father   . Other Mother        Mother died at 48 with no real medical problems    Social History   Socioeconomic History  . Marital status: Single    Spouse name: Not on file  . Number of children: Not on file  . Years of education: Not on file  . Highest education level: Not on file  Occupational History  . Not  on file  Tobacco Use  . Smoking status: Former Smoker    Quit date: 10/11/1971    Years since quitting: 48.5  . Smokeless tobacco: Never Used  Substance and Sexual Activity  . Alcohol use: Yes    Comment: rare  . Drug use: No  . Sexual activity: Not on file  Other Topics Concern  . Not on file  Social History Narrative  . Not on file   Social Determinants of Health   Financial Resource Strain:   . Difficulty of Paying Living Expenses:   Food Insecurity:   . Worried About Charity fundraiser in the Last Year:   . Arboriculturist in the Last Year:   Transportation Needs:   . Film/video editor (Medical):   [name] Lack of Transportation (Non-Medical):   Physical  Activity:   . Days of Exercise per Week:   . Minutes of Exercise per Session:   Stress:   . Feeling of Stress :   Social Connections:   . Frequency of Communication with Friends and Family:   . Frequency of Social Gatherings with Friends and Family:   . Attends Religious Services:   . Active Member of Clubs or Organizations:   . Attends Archivist Meetings:   [name] Marital Status:   Intimate Partner Violence:   . Fear of Current or Ex-Partner:   . Emotionally Abused:   [name] Physically Abused:   . Sexually Abused:      Physical Exam   Vitals:   04/04/20 2030 04/04/20 2045  BP: (!) 126/50 (!) 128/54  Pulse: 67 71  Resp: (!) 28 (!) 23  Temp:    SpO2: 98% 99%    CONSTITUTIONAL: Well-appearing, NAD NEURO:  Alert and oriented x 3, no focal deficits EYES:  eyes equal and reactive ENT/NECK:  no LAD, no JVD CARDIO: Regular rate, well-perfused, normal S1 and S2 PULM:  CTAB no wheezing or rhonchi GI/GU:  normal bowel sounds, non-distended, non-tender MSK/SPINE:  No gross deformities, no edema SKIN:  no rash, atraumatic PSYCH:  Appropriate speech and behavior  *Additional and/or pertinent findings included in MDM below  Diagnostic and Interventional Summary    EKG Interpretation  Date/Time:  Wednesday Apr 04 2020 12:12:11 EDT Ventricular Rate:  71 PR Interval:    QRS Duration: 154 QT Interval:  436 QTC Calculation: 473 R Axis:   -81 Text Interpretation: Ventricular-paced rhythm Abnormal ECG Confirmed by Gerlene Fee [phone]) on 04/04/2020 8:35:24 PM      Labs Reviewed  BASIC METABOLIC PANEL - Abnormal; Notable for the following components:      Result Value   Chloride 113 (*)    Glucose, Bld 120 (*)    BUN 30 (*)    Creatinine, Ser 1.80 (*)    Calcium 8.2 (*)    GFR calc non Af Amer 24 (*)    GFR calc Af Amer 28 (*)    All other components within normal limits  CBC - Abnormal; Notable for the following components:   RBC 3.61 (*)    Hemoglobin 9.3 (*)     HCT 32.2 (*)    MCH 25.8 (*)    MCHC 28.9 (*)    RDW 16.1 (*)    All other components within normal limits  TROPONIN I (HIGH SENSITIVITY) - Abnormal; Notable for the following components:   Troponin I (High Sensitivity) 19 (*)    All other components within normal limits  SARS CORONAVIRUS 2 BY RT  PCR ([place] [facility])  HEPATIC FUNCTION PANEL  BRAIN NATRIURETIC PEPTIDE  CBG MONITORING, ED  TROPONIN I (HIGH SENSITIVITY)    DG Chest 2 View  Final Result      Medications  sodium chloride flush (NS) 0.9 % injection 3 mL (3 mLs Intravenous Given 04/04/20 2018)     Procedures  /  Critical Care .1-3 Lead EKG Interpretation Performed by: Maudie Flakes, MD Authorized by: Maudie Flakes, MD     Interpretation: abnormal     ECG rate assessment: normal     Rhythm: paced     Ectopy: none   Comments:     Cardiac monitoring was ordered to monitor the patient for dysrhythmia.  I personally interpreted the patient's cardiac monitor while at the bedside.     ED Course and Medical Decision Making  I have reviewed the triage vital signs, the nursing notes, and pertinent available records from the EMR.  Listed above are laboratory and imaging tests that I personally ordered, reviewed, and interpreted and then considered in my medical decision making (see below for details).      High risk syncope given patient's age and cardiac history, well-appearing now with normal vital signs, will admit for observation and telemetry.  No evidence of DVT, no tachycardia, no tachypnea, doubt PE.  If not arrhythmia, likely related to orthostatic hypotension based on patient history.   Barth Kirks. Sedonia Small, [place] [email or web address]  Final Clinical Impressions(s) / ED Diagnoses     ICD-10-CM   1. Syncope, unspecified syncope type  R55     ED Discharge Orders    None       Discharge Instructions Discussed  with and Provided to Patient:   Discharge Instructions   None       Maudie Flakes, MD 04/04/20 2108

## 2020-04-05 ENCOUNTER — Observation Stay (HOSPITAL_BASED_OUTPATIENT_CLINIC_OR_DEPARTMENT_OTHER): Payer: Medicare Other

## 2020-04-05 DIAGNOSIS — I351 Nonrheumatic aortic (valve) insufficiency: Secondary | ICD-10-CM | POA: Diagnosis not present

## 2020-04-05 DIAGNOSIS — I340 Nonrheumatic mitral (valve) insufficiency: Secondary | ICD-10-CM

## 2020-04-05 DIAGNOSIS — I361 Nonrheumatic tricuspid (valve) insufficiency: Secondary | ICD-10-CM | POA: Diagnosis not present

## 2020-04-05 DIAGNOSIS — R55 Syncope and collapse: Secondary | ICD-10-CM | POA: Diagnosis not present

## 2020-04-05 LAB — CBC
HCT: 28.7 % — ABNORMAL LOW (ref 36.0–46.0)
Hemoglobin: 8.4 g/dL — ABNORMAL LOW (ref 12.0–15.0)
MCH: 25.4 pg — ABNORMAL LOW (ref 26.0–34.0)
MCHC: 29.3 g/dL — ABNORMAL LOW (ref 30.0–36.0)
MCV: 86.7 fL (ref 80.0–100.0)
Platelets: 146 10*3/uL — ABNORMAL LOW (ref 150–400)
RBC: 3.31 MIL/uL — ABNORMAL LOW (ref 3.87–5.11)
RDW: 16.1 % — ABNORMAL HIGH (ref 11.5–15.5)
WBC: 5.7 10*3/uL (ref 4.0–10.5)
nRBC: 0 % (ref 0.0–0.2)

## 2020-04-05 LAB — COMPREHENSIVE METABOLIC PANEL
ALT: 11 U/L (ref 0–44)
AST: 16 U/L (ref 15–41)
Albumin: 2.6 g/dL — ABNORMAL LOW (ref 3.5–5.0)
Alkaline Phosphatase: 74 U/L (ref 38–126)
Anion gap: 12 (ref 5–15)
BUN: 30 mg/dL — ABNORMAL HIGH (ref 8–23)
CO2: 22 mmol/L (ref 22–32)
Calcium: 7.8 mg/dL — ABNORMAL LOW (ref 8.9–10.3)
Chloride: 110 mmol/L (ref 98–111)
Creatinine, Ser: 1.73 mg/dL — ABNORMAL HIGH (ref 0.44–1.00)
GFR calc Af Amer: 29 mL/min — ABNORMAL LOW (ref >60)
GFR calc non Af Amer: 25 mL/min — ABNORMAL LOW (ref >60)
Glucose, Bld: 102 mg/dL — ABNORMAL HIGH (ref 70–99)
Potassium: 3.8 mmol/L (ref 3.5–5.1)
Sodium: 144 mmol/L (ref 135–145)
Total Bilirubin: 0.9 mg/dL (ref 0.3–1.2)
Total Protein: 5.3 g/dL — ABNORMAL LOW (ref 6.5–8.1)

## 2020-04-05 LAB — IRON AND TIBC
Iron: 24 ug/dL — ABNORMAL LOW (ref 28–170)
Saturation Ratios: 7 % — ABNORMAL LOW (ref 10.4–31.8)
TIBC: 328 ug/dL (ref 250–450)
UIBC: 304 ug/dL

## 2020-04-05 LAB — GLUCOSE, CAPILLARY: Glucose-Capillary: 85 mg/dL (ref 70–99)

## 2020-04-05 LAB — ECHOCARDIOGRAM COMPLETE
Height: 62 in
Weight: 3040 oz

## 2020-04-05 LAB — PREPARE RBC (CROSSMATCH)

## 2020-04-05 LAB — VITAMIN B12: Vitamin B-12: 415 pg/mL (ref 180–914)

## 2020-04-05 LAB — FERRITIN: Ferritin: 19 ng/mL (ref 11–307)

## 2020-04-05 MED ORDER — SODIUM CHLORIDE 0.9 % IV SOLN: INTRAVENOUS | Status: DC

## 2020-04-05 MED ORDER — SODIUM CHLORIDE 0.9% IV SOLUTION: Freq: Once | INTRAVENOUS | Status: AC

## 2020-04-05 MED ORDER — MIDODRINE HCL 5 MG PO TABS
2.5000 mg | ORAL_TABLET | Freq: Three times a day (TID) | ORAL | Status: DC
  Administered 2020-04-05: 2.5 mg via ORAL
  Filled 2020-04-05: qty 1

## 2020-04-05 MED ORDER — FEBUXOSTAT 40 MG PO TABS
40.0000 mg | ORAL_TABLET | Freq: Every day | ORAL | Status: DC
  Administered 2020-04-05 – 2020-04-07 (×3): 40 mg via ORAL
  Filled 2020-04-05 (×3): qty 1

## 2020-04-05 MED ORDER — MIDODRINE HCL 5 MG PO TABS
5.0000 mg | ORAL_TABLET | Freq: Three times a day (TID) | ORAL | Status: DC
  Administered 2020-04-05 – 2020-04-06 (×2): 5 mg via ORAL
  Filled 2020-04-05 (×2): qty 1

## 2020-04-05 MED ORDER — PERFLUTREN LIPID MICROSPHERE
1.0000 mL | INTRAVENOUS | Status: AC | PRN
  Administered 2020-04-05: 2 mL via INTRAVENOUS
  Filled 2020-04-05: qty 10

## 2020-04-05 MED ORDER — FERROUS SULFATE 325 (65 FE) MG PO TABS
325.0000 mg | ORAL_TABLET | Freq: Two times a day (BID) | ORAL | Status: DC
  Administered 2020-04-05 – 2020-04-07 (×6): 325 mg via ORAL
  Filled 2020-04-05 (×6): qty 1

## 2020-04-05 NOTE — Progress Notes (Signed)
OK to change Uloric to 40mg  PO qday (max recommended) due to her renal function. This dose will likely not change due to age. (per Dr Eliseo Squires)  Onnie Boer, PharmD, BCIDP, AAHIVP, CPP Infectious Disease Pharmacist 04/05/2020 10:49 AM

## 2020-04-05 NOTE — TOC Initial Note (Addendum)
Transition of Care [facility]) - Initial/Assessment Note    Patient Details  Name: [name] MRN: [id number] Date of Birth: March 14, 1926  [facility]) CM/SW Contact:    Marilu Favre, RN Phone Number: 04/05/2020, 12:51 PM  Clinical Narrative:                 Confirmed face sheet information with patient at bedside. Patient from home with daughter , son-in-law and two grandsons ages 66 and 84 year old.   Patient has a walker at home already. Needs 3 in 1 ordered with Zac with [facility].  Discussed HHPT/OT and aide. Patient states everyone in the home works in the morning, so she asked for an aide to stay with her every morning for a few hours. NCM explained home health would run her insurance to see coverage for an aide, however if covered it would not be every day for a couple hours. Private duty is out of pocket expense. NCM suggested maybe seeing if she has a friend or family member who could sit with her.   Provided Medicare.gov home health list. Patient would like to discuss with her daughter before deciding on a home health agency. NCM asked patient to chose her top five choices. NCM will call in order patient provides to get a accepting agency. Patient voiced understanding. NCM will follow up later with patient  Received a call from patient's daughter [name]. Explained all of above.  Explained insurance does not cover 24/7 supervision. [name] states everyone in home works , her 84 year old is "remotely there".  Unfortunately insurance does pay for private duty sitters. Explained sitters are private pay. [name] states family cannot afford private sitters . NCM asked if there was a Animal nutritionist member who could sit with patient. Daughter feels that is too much to ask.   Daughter would like to speak to Attending MD. Messaged Dr Eliseo Squires.  Daughters choices for home health agencies are  [facility] : [facility] unable to accept referral.   [facility] sent  [facility] a message. [facility] unable to accept.   [facility] awaiting call back.   [facility] checking awaiting call back.   [facility].  Daughter is interested in SNF, preference for [facility]. Explained SNF process to daughter. Will fax out to [facility] and surrounding areas and call [facility]. Daughter aware insurance will need to approve and SNF may not allow visitation.   [facility] only has beds available for covid patients at present. [name] aware. Her second choice is Occupational psychologist. Donny Pique at [facility] a message.   Expected Discharge Plan: [place] Barriers to Discharge: Continued Medical Work up   Patient Goals and CMS Choice Patient states their goals for this hospitalization and ongoing recovery are:: to return to home CMS Medicare.gov Compare Post Acute Care list provided to:: Patient Choice offered to / list presented to : Patient  Expected Discharge Plan and Services Expected Discharge Plan: [place]   Discharge Planning Services: CM Consult Post Acute Care Choice: Home Health, Durable Medical Equipment Living arrangements for the past 2 months: Single Family Home                 DME Arranged: 3-N-1 DME Agency: [facility] Date DME Agency Contacted: 04/05/20 Time DME Agency Contacted: 1250 Representative spoke with at DME Agency: [place]: PT, OT, Nurse's Aide          Prior Living  Arrangements/Services Living arrangements for the past 2 months: Single Family Home Lives with:: Adult Children Patient language and need for interpreter reviewed:: Yes Do you feel safe going back to the place where you live?: Yes      Need for Family Participation in Patient Care: Yes (Comment) Care giver support system in place?: Yes (comment) Current home services: DME Criminal Activity/Legal Involvement Pertinent to Current Situation/Hospitalization: No - Comment as needed  Activities of Daily Living Home Assistive  Devices/Equipment: Gilford Rile (specify type) ADL Screening (condition at time of admission) Patient's cognitive ability adequate to safely complete daily activities?: Yes Is the patient deaf or have difficulty hearing?: Yes Does the patient have difficulty seeing, even when wearing glasses/contacts?: Yes Does the patient have difficulty concentrating, remembering, or making decisions?: No Patient able to express need for assistance with ADLs?: Yes Does the patient have difficulty dressing or bathing?: Yes Independently performs ADLs?: Yes (appropriate for developmental age) Does the patient have difficulty walking or climbing stairs?: Yes Weakness of Legs: Both Weakness of Arms/Hands: None  Permission Sought/Granted   Permission granted to share information with : No              Emotional Assessment Appearance:: Appears stated age Attitude/Demeanor/Rapport: Engaged Affect (typically observed): Accepting Orientation: : Oriented to Self, Oriented to Place, Oriented to  Time, Oriented to Situation Alcohol / Substance Use: Not Applicable Psych Involvement: No (comment)  Admission diagnosis:  Syncope and collapse [R55] Syncope, unspecified syncope type [R55] Patient Active Problem List   Diagnosis Date Noted  . Syncope and collapse 04/04/2020  . Influenza A   . Bronchiectasis with acute exacerbation ([place])   . Respiratory distress 10/06/2017  . Orthostatic hypotension dysautonomic syndrome ([place])   . Acute renal failure superimposed on stage 3 chronic kidney disease ([place]) 05/21/2016  . Cellulitis of leg, right 08/18/2015  . NSVT (nonsustained ventricular tachycardia) ([place])   . Gout flare 08/16/2015  . Abnormal thyroid function test 08/15/2015  . Right ankle pain 08/15/2015  . History of gout 08/15/2015  . Ankle pain   . S/P MVR (mitral valve repair) 01/05/2014  . Acute posthemorrhagic anemia 12/29/2013  . Encounter for therapeutic drug monitoring 12/12/2013  . UTI (lower  urinary tract infection) 11/27/2013  . Gout 11/07/2013  . Orthostatic hypotension 10/31/2013  . Chronic diastolic CHF (congestive heart failure) ([place])   . Hypertension   . Mitral valve disorder   . Permanent atrial fibrillation ([place]) 10/28/2013  . Biventricular cardiac pacemaker in situ 10/14/2013  . Perirectal abscess 10/13/2013  . Chronic anticoagulation 10/10/2013  . Liver cirrhosis ([place]) 10/10/2013  . Thrombocytopenia ([place]) 10/10/2013  . Nausea and vomiting 10/10/2013  . CKD (chronic kidney disease) stage 3, GFR 30-59 ml/min 10/10/2013  . Anemia 10/10/2013   PCP:  [name], MD Pharmacy:   [facility], [address] [place] [address] Phone: [phone] Fax: [phone]  [id number] - [place], [place] - [address] [place] [address] Phone: [phone] Fax: [phone]     Social Determinants of Health (SDOH) Interventions    Readmission Risk Interventions No flowsheet data found.

## 2020-04-05 NOTE — Evaluation (Signed)
Physical Therapy Evaluation Patient Details Name: [name] MRN: [id number] DOB: 03-25-1926 Today's Date: 04/05/2020   History of Present Illness  [name] is a very pleasant 86 y.o. female with a past medical history that includes orthostatic hypotension, paroxysmal atrial fibrillation, hypertension, chronic kidney disease stage III, biventricular cardiac pacemaker, chronic diastolic heart failure admitted May 19 chief complaint recurrent syncope.  Initial work-up unrevealing but telemetry reading with ?PVC's. Admitted for cardiology consultation, 2D echo, interrogation of her pacemaker.    Clinical Impression  Pt presented supine in bed with HOB elevated, awake and willing to participate in therapy session. Prior to admission, pt reported that she ambulated within her home without an AD and used a w/c for community level distances. Pt lives with her daughter and grandson in a two level home with a level entry. At the time of evaluation, pt limited overall secondary to dizziness and soft BP's (see below); however, she was able to perform all mobility without the need for physical assistance. She ambulated a short distance in her room with use of RW and min guard for safety, but was limited in distance secondary to feeling dizzy. BP was assessed throughout (see below). Pt's daughter present at end of session and PT explained recommendations and reasoning for recommendations to both pt and daughter. Pt would continue to benefit from skilled physical therapy services at this time while admitted and after d/c to address the below listed limitations in order to improve overall safety and independence with functional mobility.  BP supine = 126/51 BP sitting = 111/50 BP standing = 106/38 BP in sitting after ambulation = 97/46     Follow Up Recommendations Home health PT;Supervision/Assistance - 24 hour    Equipment Recommendations  None recommended by PT    Recommendations for Other  Services       Precautions / Restrictions Precautions Precautions: Fall Precaution Comments: orthostatic vitals Restrictions Weight Bearing Restrictions: No      Mobility  Bed Mobility Overal bed mobility: Needs Assistance Bed Mobility: Supine to Sit;Sit to Supine     Supine to sit: Supervision Sit to supine: Supervision   General bed mobility comments: for safety  Transfers Overall transfer level: Needs assistance Equipment used: Rolling walker (2 wheeled) Transfers: Sit to/from Stand Sit to Stand: Min guard         General transfer comment: pt standing once with just one UE support on bed rail; then performed again with use of RW  Ambulation/Gait Ambulation/Gait assistance: Min guard Gait Distance (Feet): 20 Feet Assistive device: Rolling walker (2 wheeled) Gait Pattern/deviations: Step-through pattern;Decreased step length - right;Decreased step length - left;Decreased stride length;Trunk flexed Gait velocity: decreased   General Gait Details: pt with slow, steady gait with use of RW; able to navigate in narrow space of room; limited however due to dizziness with soft BP  Stairs            Wheelchair Mobility    Modified Rankin (Stroke Patients Only)       Balance Overall balance assessment: Needs assistance Sitting-balance support: Feet supported Sitting balance-Leahy Scale: Fair     Standing balance support: Single extremity supported;Bilateral upper extremity supported Standing balance-Leahy Scale: Poor                               Pertinent Vitals/Pain Pain Assessment: No/denies pain    Home Living Family/patient expects to be discharged to:: Private residence Living  Arrangements: Children;Other (Comment)(daughter, son in law, 2 grandsons 25 and 29) Available Help at Discharge: Family;Available PRN/intermittently;Other (Comment)(16 yr old grandson will be done with HS starting 5/21) Type of Home: House Home Access: Level  entry     Home Layout: Multi-level;Able to live on main level with bedroom/bathroom Home Equipment: Gilford Rile - 2 wheels;Wheelchair - manual;Shower seat - built in;Other (comment)(adjustable bed, lift chair)      Prior Function Level of Independence: Needs assistance   Gait / Transfers Assistance Needed: ambulates in house without AD, wheelchair for community distance due to chronic back pain  ADL's / Homemaking Assistance Needed: pt reports independent with basic self care, DTR and son in law work until later evening. patient reports she often sleeps in lift chair        Hand Dominance        Extremity/Trunk Assessment   Upper Extremity Assessment Upper Extremity Assessment: Defer to OT evaluation;Generalized weakness    Lower Extremity Assessment Lower Extremity Assessment: Generalized weakness    Cervical / Trunk Assessment Cervical / Trunk Assessment: Kyphotic  Communication   Communication: HOH  Cognition Arousal/Alertness: Awake/alert Behavior During Therapy: WFL for tasks assessed/performed Overall Cognitive Status: Within Functional Limits for tasks assessed                                 General Comments: cognition not formally assessed but [facility] for general conversation and today's tasks      General Comments      Exercises     Assessment/Plan    PT Assessment Patient needs continued PT services  PT Problem List Decreased strength;Decreased range of motion;Decreased activity tolerance;Decreased balance;Decreased mobility;Decreased coordination;Decreased knowledge of use of DME;Decreased safety awareness;Decreased knowledge of precautions;Cardiopulmonary status limiting activity       PT Treatment Interventions DME instruction;Gait training;Stair training;Therapeutic activities;Functional mobility training;Therapeutic exercise;Balance training;Neuromuscular re-education;Patient/family education    PT Goals (Current goals can be found in the  Care Plan section)  Acute Rehab PT Goals Patient Stated Goal: to figure out what is going on PT Goal Formulation: With patient/family Time For Goal Achievement: 04/19/20 Potential to Achieve Goals: Good    Frequency Min 3X/week   Barriers to discharge        Co-evaluation               AM-PAC PT "6 Clicks" Mobility  Outcome Measure Help needed turning from your back to your side while in a flat bed without using bedrails?: None Help needed moving from lying on your back to sitting on the side of a flat bed without using bedrails?: None Help needed moving to and from a bed to a chair (including a wheelchair)?: None Help needed standing up from a chair using your arms (e.g., wheelchair or bedside chair)?: None Help needed to walk in hospital room?: A Little Help needed climbing 3-5 steps with a railing? : A Little 6 Click Score: 22    End of Session Equipment Utilized During Treatment: Gait belt Activity Tolerance: Patient tolerated treatment well Patient left: in bed;with call bell/phone within reach;with bed alarm set;with family/visitor present Nurse Communication: Mobility status PT Visit Diagnosis: Other abnormalities of gait and mobility (R26.89)    Time: 4782-9562 PT Time Calculation (min) (ACUTE ONLY): 24 min   Charges:   PT Evaluation $PT Eval Moderate Complexity: 1 Mod PT Treatments $Therapeutic Activity: 8-22 mins        Eduard Clos, PT, DPT  Acute Rehabilitation Services Pager [phone] Office [place] 04/05/2020, 3:31 PM

## 2020-04-05 NOTE — Progress Notes (Signed)
Progress Note    [name]  IRC:[id number] DOB: [birth year]  DOA: 04/04/2020 PCP: [name], MD    Brief Narrative:    Medical records reviewed and are as summarized below:  [name] is a very pleasant 86 y.o. female with a past medical history that includes orthostatic hypotension, paroxysmal atrial fibrillation, hypertension, chronic kidney disease stage III, biventricular cardiac pacemaker, chronic diastolic heart failure admitted May 19 chief complaint recurrent syncope.  Initial work-up unrevealing but telemetry reading with ?PVC's. Admitted for cardiology consultation, 2D echo, interrogation of her pacemaker.  Assessment/Plan:   Principal Problem:   Syncope and collapse Active Problems:   Permanent atrial fibrillation (HCC)   Nausea and vomiting   CKD (chronic kidney disease) stage 3, GFR 30-59 ml/min   Biventricular cardiac pacemaker in situ   Chronic diastolic CHF (congestive heart failure) (HCC)   Hypertension   Orthostatic hypotension dysautonomic syndrome ([place])   #1.  Syncope.  Recurrent.  Concern for cardiac etiology.  No signs of infection.  No metabolic derangements. ekg with paced rythm  Reportedly her pacemaker was interrogated when she was in the emergency department however there is no documentation of that.  She does have a history of orthostatic hypotension disautonomic syndrome.  Home medications include midodrine.  Evaluated by PT this morning and patient positive orthostatics. -Resume home meds -Follow-up on interrogation of pacemaker -follow echo -Cardiology consult requested  #2.  Nausea and vomiting.  Patient had an episode of nausea and vomiting with standing and bowel movement this morning.? Orthostatic vitals - resume home midodrine -zofran  -monitor  #3.  Chronic diastolic heart failure. Compensated medications include Demadex. -follow echo -Hold Demadex for now -Obtain daily weights -Monitor intake and output  #4.  Chronic  kidney disease stage III.  Creatinine appears to be stable at baseline. -Hold nephrotoxins as able -Monitor urine output -Recheck in the morning  #5.  Hypertension.  Patient with a history of orthostatic hypotension dysautonomic syndrome. -See #1   Family Communication/Anticipated D/C date and plan/Code Status   DVT prophylaxis: scd ordered. Code Status: Full Code.  Family Communication:  Disposition Plan: Status is: Observation  The patient remains OBS appropriate and will d/c before 2 midnights.  Dispo: The patient is from: Home              Anticipated d/c is to: Home              Anticipated d/c date is: 1 day              Patient currently is not medically stable to d/c.         Medical Consultants:    cardiology   Anti-Infectives:    None  Subjective:   Lying in bed watching TV.  Denies pain or discomfort.  Objective:    Vitals:   04/04/20 2130 04/05/20 0231 04/05/20 0424 04/05/20 0815  BP: (!) 126/46  (!) 114/45 (!) 113/51  Pulse: 68  70 70  Resp: 20   17  Temp:   (!) 97.5 F (36.4 C) 98.4 F (36.9 C)  TempSrc:   Oral Oral  SpO2: 100%  98% 99%  Weight:  86.2 kg    Height:  5\' 2"  (1.575 m)      Intake/Output Summary (Last 24 hours) at 04/05/2020 1042 Last data filed at 04/04/2020 1225 Gross per 24 hour  Intake 220 ml  Output --  Net 220 ml   [employer]   04/05/20  0231  Weight: 86.2 kg    Exam: General: Well-nourished calm cooperative no acute distress CV: Regular rate and rhythm no lower extremity edema Respiratory: No increased work of breathing breath sounds are clear bilaterally I hear no wheezes no rhonchi Abdomen: Nondistended soft positive bowel sounds throughout nontender to palpation no guarding or rebounding Musculoskeletal: Joints without swelling/erythema full range of motion Neuro: Alert and oriented x3 speech clear facial symmetry moves all extremities spontaneously  Data Reviewed:   I have personally reviewed  following labs and imaging studies:  Labs: Labs show the following:   Basic Metabolic Panel: Recent Labs  Lab 04/04/20 1221 04/05/20 0535  NA 145 144  K 3.6 3.8  CL 113* 110  CO2 23 22  GLUCOSE 120* 102*  BUN 30* 30*  CREATININE 1.80* 1.73*  CALCIUM 8.2* 7.8*   GFR Estimated Creatinine Clearance: 21.6 mL/min (A) (by C-G formula based on SCr of 1.73 mg/dL (H)). Liver Function Tests: Recent Labs  Lab 04/04/20 2001 04/05/20 0535  AST 19 16  ALT 12 11  ALKPHOS 82 74  BILITOT 0.9 0.9  PROT 6.1* 5.3*  ALBUMIN 3.0* 2.6*   No results for input(s): LIPASE, AMYLASE in the last 168 hours. No results for input(s): AMMONIA in the last 168 hours. Coagulation profile No results for input(s): INR, PROTIME in the last 168 hours.  CBC: Recent Labs  Lab 04/04/20 1221 04/05/20 0535  WBC 7.6 5.7  HGB 9.3* 8.4*  HCT 32.2* 28.7*  MCV 89.2 86.7  PLT 188 146*   Cardiac Enzymes: No results for input(s): CKTOTAL, CKMB, CKMBINDEX, TROPONINI in the last 168 hours. BNP (last 3 results) No results for input(s): PROBNP in the last 8760 hours. CBG: Recent Labs  Lab 04/04/20 2011 04/05/20 0555  GLUCAP 88 85   D-Dimer: No results for input(s): DDIMER in the last 72 hours. Hgb A1c: No results for input(s): HGBA1C in the last 72 hours. Lipid Profile: No results for input(s): CHOL, HDL, LDLCALC, TRIG, CHOLHDL, LDLDIRECT in the last 72 hours. Thyroid function studies: Recent Labs    04/04/20 2147  TSH 2.771   Anemia work up: No results for input(s): VITAMINB12, FOLATE, FERRITIN, TIBC, IRON, RETICCTPCT in the last 72 hours. Sepsis Labs: Recent Labs  Lab 04/04/20 1221 04/05/20 0535  WBC 7.6 5.7    Microbiology Recent Results (from the past 240 hour(s))  SARS Coronavirus 2 by RT PCR (hospital order, performed in [facility] hospital lab) Nasopharyngeal Nasopharyngeal Swab     Status: None   Collection Time: 04/04/20  9:02 PM   Specimen: Nasopharyngeal Swab  Result Value  Ref Range Status   SARS Coronavirus 2 NEGATIVE NEGATIVE Final    Comment: (NOTE) SARS-CoV-2 target nucleic acids are NOT DETECTED. The SARS-CoV-2 RNA is generally detectable in upper and lower respiratory specimens during the acute phase of infection. The lowest concentration of SARS-CoV-2 viral copies this assay can detect is 250 copies / mL. A negative result does not preclude SARS-CoV-2 infection and should not be used as the sole basis for treatment or other patient management decisions.  A negative result may occur with improper specimen collection / handling, submission of specimen other than nasopharyngeal swab, presence of viral mutation(s) within the areas targeted by this assay, and inadequate number of viral copies (<250 copies / mL). A negative result must be combined with clinical observations, patient history, and epidemiological information. Fact Sheet for Patients:   StrictlyIdeas.no Fact Sheet for Healthcare Providers: BankingDealers.co.za This test is not  yet approved or cleared  by the [place] and has been authorized for detection and/or diagnosis of SARS-CoV-2 by FDA under an Emergency Use Authorization (EUA).  This EUA will remain in effect (meaning this test can be used) for the duration of the COVID-19 declaration under Section 564(b)(1) of the Act, 21 U.S.C. section 360bbb-3(b)(1), unless the authorization is terminated or revoked sooner. Performed at [facility], [place] [address], [place], [place] [address]     Procedures and diagnostic studies:  DG Chest 2 View  Result Date: 04/04/2020 CLINICAL DATA:  Syncopal episode. EXAM: CHEST - 2 VIEW COMPARISON:  PA and lateral chest 05/18/2018. FINDINGS: Three lead pacing device is in place and unchanged. The patient is status post mitral valve repair. There is cardiomegaly. Atherosclerosis noted. Lungs clear. No pneumothorax or pleural effusion.  IMPRESSION: Cardiomegaly without acute disease. Aortic Atherosclerosis (ICD10-I70.0). Electronically Signed   By: Inge Rise M.D.   On: 04/04/2020 13:11    Medications:   . febuxostat  80 mg Oral Daily  . ferrous sulfate  325 mg Oral BID WC  . midodrine  2.5 mg Oral TID WC  . sodium chloride flush  3 mL Intravenous Q12H   Continuous Infusions:   LOS: 0 days   Radene Gunning NP  [facility]   How to contact the [facility] Attending or Consulting provider [place] or covering provider during after hours [place], for this patient?  1. Check the care team in [facility] and look for a) attending/consulting TR[facility] provider listed and b) the [facility] team listed 2. Log into [email or web address] and use [place]'s universal password to access. If you do not have the password, please contact the hospital operator. 3. Locate the [facility] provider you are looking for under [facility] and page to a number that you can be directly reached. 4. If you still have difficulty reaching the provider, please page the [facility] (Director on Call) for the Hospitalists listed on amion for assistance.  04/05/2020, 10:42 AM

## 2020-04-05 NOTE — Evaluation (Signed)
Occupational Therapy Evaluation Patient Details Name: [name] MRN: [id number] DOB: July 07, 1926 Today's Date: 04/05/2020    History of Present Illness 86 y.o. female with medical history significant of orthostatic hypotension, paroxysmal atrial fibrillation, hypertension, hyperlipidemia, who was apparently getting breakfast today became hypotensive orthostatic and passed out.   Clinical Impression   Patient with functional deficits listed below impacting safety and independence with self care. Throughout treatment patient denies dizziness but states "I just don't feel like myself." Patient had to transfer imminently to bedside commode x2 due to having ongoing loose stools, min A for transfers for safety and mod A for peri care. Difficulty obtaining true orthostatic vitals because of this and pt limited ability to stand at baseline d/t back pain. Patient had dry heaves once returned to bed after orthostatics completed, RN made aware.  Patient reports she is interested if insurance would cover home health aid for few hours in the morning while her DTR is at work.  Supine BP 123/49 EOB BP 107/47 Stand 90/76     Follow Up Recommendations  Home health OT;Supervision/Assistance - 24 hour (interested in [facility] aid few hrs in AM)    Equipment Recommendations  3 in 1 bedside commode       Precautions / Restrictions Precautions Precautions: Fall Precaution Comments: orthostatic vitals Restrictions Weight Bearing Restrictions: No      Mobility Bed Mobility Overal bed mobility: Needs Assistance Bed Mobility: Supine to Sit;Sit to Supine     Supine to sit: Min guard;HOB elevated Sit to supine: Min guard;HOB elevated   General bed mobility comments: min G for safety  Transfers Overall transfer level: Needs assistance Equipment used: None Transfers: Sit to/from [facility] Sit to Stand: Min guard Stand pivot transfers: Min assist       General transfer comment:  min guard to power up to standing for safety, min A to [facility] x2 cues for hand placement    Balance Overall balance assessment: Mild deficits observed, not formally tested                                         ADL either performed or assessed with clinical judgement   ADL Overall ADL's : Needs assistance/impaired Eating/Feeding: Independent;Sitting   Grooming: Set up;Sitting   Upper Body Bathing: Set up;Sitting   Lower Body Bathing: Minimal assistance;Sit to/from stand   Upper Body Dressing : Set up;Sitting   Lower Body Dressing: Minimal assistance;Sit to/from stand   Toilet Transfer: Minimal assistance;BSC;Stand-pivot;Cueing for Office manager Details (indicate cue type and reason): min A for safety, patient needing to use bedside commode urgently x2 due to watery stools Toileting- Clothing Manipulation and Hygiene: Moderate assistance;Sit to/from stand Toileting - Clothing Manipulation Details (indicate cue type and reason): pt attempts peri care in semi-stand however requires increased time and becomes fatigued. assist patient for thoroughness     Functional mobility during ADLs: Minimal assistance General ADL Comments: patient require increased assistance with self care due to decreased activity tolerance, safety,                   Pertinent Vitals/Pain Pain Assessment: Faces Faces Pain Scale: Hurts little more Pain Location: back with mobility Pain Descriptors / Indicators: Aching Pain Intervention(s): Monitored during session     Hand Dominance Left   Extremity/Trunk Assessment Upper Extremity Assessment Upper Extremity Assessment: Generalized weakness   Lower Extremity  Assessment Lower Extremity Assessment: Defer to PT evaluation       Communication Communication Communication: HOH   Cognition Arousal/Alertness: Awake/alert Behavior During Therapy: WFL for tasks assessed/performed Overall Cognitive Status: Within Functional  Limits for tasks assessed                                                Home Living Family/patient expects to be discharged to:: Private residence Living Arrangements: Children;Other (Comment)(daughter, son in law, 2 grandsons 48 and 38) Available Help at Discharge: Family;Available PRN/intermittently;Other (Comment)(16 yr old grandson will be done with HS starting 5/21) Type of Home: House Home Access: Level entry     Home Layout: Multi-level;Able to live on main level with bedroom/bathroom     Bathroom Shower/Tub: Occupational psychologist: Standard     Home Equipment: Environmental consultant - 2 wheels;Wheelchair - manual;Shower seat - built in;Other (comment)(adjustable bed, lift chair)          Prior Functioning/Environment Level of Independence: Needs assistance  Gait / Transfers Assistance Needed: ambulates in house without AD, wheelchair for community distance due to chronic back pain ADL's / Homemaking Assistance Needed: pt reports independent with basic self care, DTR and son in law work until later evening. patient reports she often sleeps in lift chair            OT Problem List: Decreased activity tolerance;Impaired balance (sitting and/or standing);Decreased safety awareness;Decreased knowledge of use of DME or AE;Pain      OT Treatment/Interventions: Self-care/ADL training;Therapeutic exercise;Energy conservation;DME and/or AE instruction;Therapeutic activities;Patient/family education;Balance training    OT Goals(Current goals can be found in the care plan section) Acute Rehab OT Goals Patient Stated Goal: to feel better OT Goal Formulation: With patient Time For Goal Achievement: 04/19/20 Potential to Achieve Goals: Good  OT Frequency: Min 3X/week    AM-PAC OT "6 Clicks" Daily Activity     Outcome Measure Help from another person eating meals?: None Help from another person taking care of personal grooming?: A Little Help from another  person toileting, which includes using toliet, bedpan, or urinal?: A Lot Help from another person bathing (including washing, rinsing, drying)?: A Little Help from another person to put on and taking off regular upper body clothing?: A Little Help from another person to put on and taking off regular lower body clothing?: A Little 6 Click Score: 18   End of Session Nurse Communication: Mobility status;Other (comment)(orthostatics, loose stools)  Activity Tolerance: Treatment limited secondary to medical complications (Comment);Other (comment)(orthostatics, ongoing loose stool) Patient left: in bed;with call bell/phone within reach;with bed alarm set  OT Visit Diagnosis: History of falling (Z91.81);Other abnormalities of gait and mobility (R26.89);Pain Pain - part of body: (back)                Time: 2706-2376 OT Time Calculation (min): 45 min Charges:  OT General Charges $OT Visit: 1 Visit OT Evaluation $OT Eval Moderate Complexity: 1 Mod OT Treatments $Self Care/Home Management : 23-37 mins  Delbert Phenix OT OT office: [place] 04/05/2020, 12:01 PM

## 2020-04-05 NOTE — Progress Notes (Signed)
Pt states her pace maker is a medtronic pacer, pacer may have been interrogated yesterday while in ER....the pt's daughter stated she was at pt's bedside in ER when it was done.  No indication in the pt's chart that he has been done already, pt herself not sure.  NP Black request inquiring if was done. Call placed to [employer]tr[employer] rep, at [place], she will look into it as soon as she is able and get back with unit nurse.

## 2020-04-05 NOTE — Consult Note (Addendum)
Cardiology Consultation:   Patient ID: [name] MRN: [id number]; DOB: 09-21-1926  Admit date: 04/04/2020 Date of Consult: 04/05/2020  Primary Care Provider: [name], MD Primary Cardiologist: [name], MD  Primary Electrophysiologist:  Dr. Lovena Le   Patient Profile:   [name] is a 86 y.o. 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 Eliseo Squires.  History of Present Illness:   [name] last saw Dr. Lovena Le Oct 2020, she was doing well, had not ha syncope.  Noted to have NSVT on that device check, felt to have class II symptoms advised low sodium diet, no changes were made toher device or medicines. She had a tele health visit with Dr. Radford Pax, most recently Dec 2020, toerating medicines without symptoms.  Not on a/c 2/2 h/o GIB, rate controlled.  Maintained on demadex BID for her chronic CHF, lytes stable at last check.  Known orthostatic hypotension with dehydration in the past on midodrine, known NSVT, normal LVEF 2017, BP intolerant of BB, no changes were made Updated echo with LVEF 45-50%, mild-mod LV dilation, global hypokinesis, LA and RA severely dilated, tricial MR, mild MS.  The patient reports for years she has not fainted.  She will intermittently feel fleetingly lightheaded upon standing only, but "nothingbad".  Tuesday this week she says her daughter helped her to the chair saying she looked odd, like her eyes were glazed over.  She did feel bad, weak, and she said seemed pass afgter a few seconds (she thinks this would have been 5/18 about 5:30PM).  Yesterday morning while standing in her kitchen  making breakfast (5/19 about 0900) she fainted.  No real warning, says she does not recall hitting the floor, but feels was only a moment long.  She was weak unable to get up on her own and had to call her grandson to help her.  She denies trauma or injury. She tells me at baseline, had she fallen/she would normal have the strength to get up on her own, and yesterday though felt weak and unable to.  Took a few minutes to feel better or back to normal.  This she says is exactly how her fainting before felt and was found to be low BP. Her h ome BP machine batteries were bad and she was unable to check her BP at home.  In d/w via the office and Dr. Radford Pax, was advised to come to the ER, she recalls her BP being low in the ER (I don't find any low BP's in her vitals).  LABS K+ 3.6 BUN/Creat 30/1.80 >> 30/1.73  (baseline creat perhaps towards 1.5) WBC 7.6 H/H 9.3/32.2 >> 8.4/28.7  (last hgb in Epic is from 2018 was 11) Plts 188  HS Trop 19, 19 BNP 162 TSH 2.771   She has had orthostatic BP check this AM Supine 123/49 Sitting 107/47 Standing 90/76 No 3 minute   Past Medical History:  Diagnosis Date  . Anemia   . Brady-tachy syndrome ([place])    a. 2003: post-op afib after MR repair then symptomatic bradycardia requiring pacemaker. b. Upgrade to Medtronic Bi-V Pacemaker 2007.  . Cellulitis and abscess of foot  08/15/2015   RT FOOT  . Chronic atrial fibrillation ([place])    a. Previously failed DCCV/amio/tikosyn.  CHADS2VASC score 5 - not on anticoagulation due to history of GI bleeding.    . Chronic combined systolic and diastolic CHF (congestive heart failure) (HCC)    a. EF 35-40% on echo 09/2013 b. echo 04/2016: EF 50-55% with mild to moderate MS.  . Cirrhosis ([name])    a. Newly recognized 09/2013 - by CT.  . CKD (chronic kidney disease)   . GI bleeding   . Hypertension   . Mitral valve disorder    a. Severe MR s/p repair 2003 (28 mm annuloplasty ring and and oversew of LAA). No CAD by  cath at that time.  [name] NSVT (nonsustained ventricular tachycardia) ([place])    a. Isolated event during 09/2007 adm.  . Orthostatic hypotension dysautonomic syndrome ([place])   . Pulmonary hypertension (HCC)    Group II secondary to CHF and MV disease  . Spinal stenosis    shes recieved epidural steroid injections in the past  . Tricuspid regurgitation     Past Surgical History:  Procedure Laterality Date  . APPENDECTOMY    . BIV PACEMAKER GENERATOR CHANGE OUT N/A 12/13/2014   Procedure: BIV PACEMAKER GENERATOR CHANGE OUT;  Surgeon: Evans Lance, MD;  Location: [facility] CATH LAB;  Service: Cardiovascular;  Laterality: N/A;  . CHOLECYSTECTOMY    . IRRIGATION AND DEBRIDEMENT ABSCESS N/A 10/14/2013   Procedure: IRRIGATION AND DEBRIDEMENT PERINEAL ABSCESS;  Surgeon: Rolm Bookbinder, MD;  Location: [facility];  Service: General;  Laterality: N/A;  . VALVE REPLACEMENT     sever mitral regurgitation s/p mitral valve annuloplasty ring     Home Medications:  Prior to Admission medications   Medication Sig Start Date End Date Taking? Authorizing Provider  acetaminophen (TYLENOL) 500 MG tablet Take 1,000 mg by mouth every 6 (six) hours as needed for mild pain.    Yes [provider]  colchicine 0.6 MG tablet Take 0.6 mg by mouth as needed (Gout flare).   Yes [provider]  Febuxostat 80 MG TABS Take 80 mg by mouth daily.   Yes [provider]  ferrous sulfate 325 (65 FE) MG tablet Take 1 tablet (325 mg total) by mouth 2 (two) times daily with a meal. 05/22/16  Yes Strader, [place] M, PA-C  midodrine (PROAMATINE) 2.5 MG tablet TAKE 1 TABLET BY MOUTH 3  TIMES DAILY WITH MEALS Patient taking differently: Take 2.5 mg by mouth 3 (three) times daily with meals.  11/07/19  Yes Turner, Eber Hong, MD  Multiple Vitamin (MULTIVITAMIN WITH MINERALS) TABS tablet Take 1 tablet by mouth daily.   Yes [provider]  Multiple Vitamins-Minerals (PRESERVISION AREDS 2 PO) Take 2 tablets by  mouth daily.   Yes [provider]  potassium chloride SA (KLOR-CON) 20 MEQ tablet Take 1 tablet (20 mEq total) by mouth 2 (two) times daily. 02/22/20  Yes Turner, Traci R, MD  torsemide (DEMADEX) 20 MG tablet TAKE 1 TABLET BY MOUTH 2  TIMES DAILY. TAKE EXTRA  TABLET IF WEIGHT GAIN  GREATER THAN 3 LBS IN 1 DAY Patient taking differently: Take 20 mg by mouth 2 (two) times daily. Take extra tablet if weight gain is greater than 3 pounds in one day. 06/02/19  Yes Sueanne Margarita, MD    Inpatient Medications: Scheduled Meds: . febuxostat  40 mg Oral Daily  . ferrous sulfate  325 mg Oral BID WC  . midodrine  2.5 mg Oral TID WC  . sodium chloride flush  3 mL Intravenous Q12H   Continuous Infusions:  PRN Meds: ondansetron **OR** ondansetron (ZOFRAN) IV  Allergies:   No Known Allergies  Social History:   Social History   Socioeconomic History  . Marital status: Single    Spouse name: Not on file  . Number of children: Not on file  . Years of education: Not on file  . Highest education level: Not on file  Occupational History  . Not on file  Tobacco Use  . Smoking status: Former Smoker    Quit date: 10/11/1971    Years since quitting: 48.5  . Smokeless tobacco: Never Used  Substance and Sexual Activity  . Alcohol use: Yes    Comment: rare  . Drug use: No  . Sexual activity: Not on file  Other Topics Concern  . Not on file  Social History Narrative  . Not on file   Social Determinants of Health   Financial Resource Strain:   . Difficulty of Paying Living Expenses:   Food Insecurity:   . Worried About Charity fundraiser in the Last Year:   . Arboriculturist in the Last Year:   Transportation Needs:   . Film/video editor (Medical):   [name] Lack of Transportation (Non-Medical):   Physical Activity:   . Days of Exercise per Week:   . Minutes of Exercise per Session:   Stress:   . Feeling of Stress :   Social Connections:   . Frequency of Communication with  Friends and Family:   . Frequency of Social Gatherings with Friends and Family:   . Attends Religious Services:   . Active Member of Clubs or Organizations:   . Attends Archivist Meetings:   [name] Marital Status:   Intimate Partner Violence:   . Fear of Current or Ex-Partner:   . Emotionally Abused:   [name] Physically Abused:   . Sexually Abused:     Family History:   Family History  Problem Relation Age of Onset  . Pancreatic cancer Father   . Other Mother        Mother died at 45 with no real medical problems     ROS:  Please see the history of present illness.  All other ROS reviewed and negative.     Physical Exam/Data:   Vitals:   04/05/20 0231 04/05/20 0424 04/05/20 0815 04/05/20 1218  BP:  (!) 114/45 (!) 113/51 (!) 118/42  Pulse:  70 70 71  Resp:   17 17  Temp:  (!) 97.5 F (36.4 C) 98.4 F (36.9 C) 98.1 F (36.7 C)  TempSrc:  Oral Oral Oral  SpO2:  98% 99% 97%  Weight: 86.2 kg     Height: 5\' 2"  (1.575 m)      No intake or output data in the 24 hours ending 04/05/20 1306 Last 3 Weights 04/05/2020 10/18/2019 08/24/2019  Weight (lbs) 190 lb 200 lb 203 lb  Weight (kg) 86.183 kg 90.719 kg 92.08 kg     Body mass index is 34.75 kg/m.  General:  Well nourished, well developed, in no acute distress HEENT: normal Lymph: no adenopathy Neck: no JVD Endocrine:  No thryomegaly Vascular: No carotid bruits  Cardiac:  RRR; soft SM, no gallops or rubs Lungs:  CTA b/l, no wheezing, rhonchi or rales  Abd: soft, nontender Ext: no edema Musculoskeletal:  No deformities, age appropriate atrophy Skin: warm and dry  Neuro:  no gross focal abnormalities noted Psych:  Normal affect   EKG:  The EKG was personally reviewed and demonstrates:   V paced  Telemetry:  Telemetry was personally reviewed and demonstrates:   VP, she has had some NSVT, 5, 6, and 25beats (10 seconds at 0200)   Relevant CV Studies:  12/01/2019: TTE IMPRESSIONS  1. Left ventricular ejection  fraction, by visual estimation, is 45 to  50%. The left ventricle has mildly decreased function. There is mildly  increased left ventricular hypertrophy.  2. Definity contrast agent was given IV to delineate the left ventricular  endocardial borders.  3. Left ventricular diastolic parameters are indeterminate.  4. Mild to moderately dilated left ventricular internal cavity size.  5. The left ventricle demonstrates global hypokinesis.  6. Global right ventricle has normal systolic function.The right  ventricular size is moderately enlarged. No increase in right ventricular  wall thickness.  7. Left atrial size was severely dilated.  8. Right atrial size was severely dilated.  9. 28 mm repair is present in the mitral position.  10. The mitral valve has been repaired/replaced. Trivial mitral valve  regurgitation. Mild mitral stenosis.  11. The tricuspid valve is normal in structure.  12. The aortic valve is tricuspid. Aortic valve regurgitation is mild.  Mild aortic valve sclerosis without stenosis.  13. Pulmonic regurgitation is mild.  14. The pulmonic valve was grossly normal. Pulmonic valve regurgitation is  mild.  15. Normal pulmonary artery systolic pressure.  16. The inferior vena cava is normal in size with greater than 50%  respiratory variability, suggesting right atrial pressure of 3 mmHg.   Laboratory Data:  High Sensitivity Troponin:   Recent Labs  Lab 04/04/20 1221 04/04/20 2021  TROPONINIHS 19* 19*     Chemistry Recent Labs  Lab 04/04/20 1221 04/05/20 0535  NA 145 144  K 3.6 3.8  CL 113* 110  CO2 23 22  GLUCOSE 120* 102*  BUN 30* 30*  CREATININE 1.80* 1.73*  CALCIUM 8.2* 7.8*  GFRNONAA 24* 25*  GFRAA 28* 29*  ANIONGAP 9 12    Recent Labs  Lab 04/04/20 2001 04/05/20 0535  PROT 6.1* 5.3*  ALBUMIN 3.0* 2.6*  AST 19 16  ALT 12 11  ALKPHOS 82 74  BILITOT 0.9 0.9   Hematology Recent Labs  Lab 04/04/20 1221 04/05/20 0535  WBC 7.6 5.7    RBC 3.61* 3.31*  HGB 9.3* 8.4*  HCT 32.2* 28.7*  MCV 89.2 86.7  MCH 25.8* 25.4*  MCHC 28.9* 29.3*  RDW 16.1* 16.1*  PLT 188 146*   BNP Recent Labs  Lab 04/04/20 1232  BNP 162.5*    DDimer No results for input(s): DDIMER in the last 168 hours.   Radiology/Studies:   DG Chest 2 View Result Date: 04/04/2020 CLINICAL DATA:  Syncopal episode. EXAM: CHEST - 2 VIEW COMPARISON:  PA and lateral chest 05/18/2018. FINDINGS: Three lead pacing device is in place and unchanged. The patient is status post mitral valve repair. There is cardiomegaly. Atherosclerosis noted. Lungs clear. No pneumothorax or pleural effusion. IMPRESSION: Cardiomegaly without acute disease. Aortic Atherosclerosis (ICD10-I70.0). Electronically Signed   By: Inge Rise M.D.   On: 04/04/2020 13:11     Assessment and Plan:   1. Syncope 2. NSVT, V sense episodes are noted on device, none that correspond to her 2 episodes she tells me about She has not had symptoms or cardiac awareness here I do not think her NSVT is cause for her syncope  This is known for her, previous interrogation with NSVT and VT monitored episodes historically as long as 11 seconds have been previously seen without symptoms She is device dependent and these are likely true NSVT, though asymptomatic, though not likely the cause of her syncope.  No warning though felt weak afterwards, reminds her of her previous fainting that had been found to be orthostatic She has + orthostatic BP change here  She takes demadex 20mg  BID BUN/Creat looks a little higher then her baseline, agree withholding demadex for now She is also anemic   3. Mild CM (presumed NICM), historically described as combined or diastolic     Last LVEF earlier this year 45-50%  (similar to 2016 was 50-55% 2017)     BP does not tolerate meds     She does not appear volume OL     Reduce home demadex to daily on discharge     She happens to have a visit with Dr. Radford Pax  scheduled for next week   4. CRT-P     Function is intact    Dr. Caryl Comes will see her later today     For questions or updates, please contact [facility] Please consult [email or web address] for contact info under     Signed, Baldwin Jamaica, PA-C  04/05/2020 1:06 PM  Orthostatic syncope-recurrent  VT-nonsustained  Nonischemic cardiomyopathy-EF 40%/severe biatrial enlargement   Mitral valve repair  Atrial fibrillation-permanent  Complete heart block  CRT-P-Medtronic    The patient has recurrent orthostatic syncope.  It is recognizable for her primarily in the recovery phase.  This episode she said was like so many others.  Nonsustained ventricular tachycardia is unlikely the cause of her syncope.  It could however potentially trigger a neurally mediated reflex by bringing about right ventricular underfilling.  We discussed the physiology of orthostatic intolerance including gravitational fluid shifts and the impact of hypertensive vascular disease on orthostasis and treatment options.  We discussed pharmacological options including midodrine We also discussed the importance of the timing of taking midodrine.  .  We discussed nonpharmacological options including raising the HOB, isometric contraction upon standing, abdominal binders  thigh sleeves. We emphasized the importance of recognizing the prodrome and sitting prior to falling, safety in the shower and in the bathroom and the avoidance of dehydration   We will begin with a non pharmacological approach with an abdominal binder. I have reviewed with the daughter as well as the patient raising the head of the bed 4 inches, drinking a bottle of water before she arises in the morning, taking her ProAmatine at the time that she drinks her water in the morning before arising, applying the abdominal binder prior to arising in the morning; we also discussed the role of isometric contraction while standing and prior to standing  or exiting from a car.  Smartly she is already using a shower chair.[name]

## 2020-04-05 NOTE — Progress Notes (Signed)
  Echocardiogram 2D Echocardiogram with defintiy has been performed.  [name] M 04/05/2020, 10:24 AM

## 2020-04-05 NOTE — Social Work (Signed)
CSW has initiated Biochemist, clinical for pt through [facility], reference [id number].  Clinicals faxed for review, Isaias Cowman reviewing referral per arrangements by [facility]   Westley Hummer, MSW, [place] Work

## 2020-04-05 NOTE — NC FL2 (Signed)
[place] MEDICAID FL2 LEVEL OF CARE SCREENING TOOL     IDENTIFICATION  Patient Name: [name] Birthdate: [birth year] Sex: female Admission Date (Current Location): 04/04/2020  [facility] and [place] Number:  Herbalist and Address:  [facility] [place], [place] [address] [place], [place] [address]      Provider Number: [id number]  Attending Physician Name and Address:  Geradine Girt, DO  Relative Name and Phone Number:       Current Level of Care: Hospital Recommended Level of Care: [place] Prior Approval Number:    Date Approved/Denied:   PASRR Number: [id number]  Discharge Plan: SNF    Current Diagnoses: Patient Active Problem List   Diagnosis Date Noted  . Syncope and collapse 04/04/2020  . Influenza A   . Bronchiectasis with acute exacerbation ([employer])   . Respiratory distress 10/06/2017  . Orthostatic hypotension dysautonomic syndrome ([place])   . Acute renal failure superimposed on stage 3 chronic kidney disease ([place]) 05/21/2016  . Cellulitis of leg, right 08/18/2015  . NSVT (nonsustained ventricular tachycardia) ([place])   . Gout flare 08/16/2015  . Abnormal thyroid function test 08/15/2015  . Right ankle pain 08/15/2015  . History of gout 08/15/2015  . Ankle pain   . S/P MVR (mitral valve repair) 01/05/2014  . Acute posthemorrhagic anemia 12/29/2013  . Encounter for therapeutic drug monitoring 12/12/2013  . UTI (lower urinary tract infection) 11/27/2013  . Gout 11/07/2013  . Orthostatic hypotension 10/31/2013  . Chronic diastolic CHF (congestive heart failure) ([place])   . Hypertension   . Mitral valve disorder   . Permanent atrial fibrillation ([place]) 10/28/2013  . Biventricular cardiac pacemaker in situ 10/14/2013  . Perirectal abscess 10/13/2013  . Chronic anticoagulation 10/10/2013  . Liver cirrhosis ([place]) 10/10/2013  . Thrombocytopenia ([place]) 10/10/2013  . Nausea and vomiting 10/10/2013  . CKD (chronic kidney  disease) stage 3, GFR 30-59 ml/min 10/10/2013  . Anemia 10/10/2013    Orientation RESPIRATION BLADDER Height & Weight     Self, Time, Situation, Place  Normal Incontinent, External catheter Weight: 190 lb (86.2 kg) Height:  5\' 2"  (157.5 cm)  BEHAVIORAL SYMPTOMS/MOOD NEUROLOGICAL BOWEL NUTRITION STATUS      Continent Diet  AMBULATORY STATUS COMMUNICATION OF NEEDS Skin   Extensive Assist Verbally Normal(some redness to sacrum)                       Personal Care Assistance Level of Assistance  Dressing, Bathing, Feeding Bathing Assistance: Maximum assistance Feeding assistance: Independent Dressing Assistance: Maximum assistance     Functional Limitations Info  Hearing, Sight, Speech Sight Info: Adequate Hearing Info: Adequate Speech Info: Adequate    SPECIAL CARE FACTORS FREQUENCY  PT (By licensed PT), OT (By licensed OT)     PT Frequency: 5x week OT Frequency: 5x week            Contractures Contractures Info: Not present    Additional Factors Info  Code Status, Allergies Code Status Info: Full Code Allergies Info: No Known Allergies           Current Medications (04/05/2020):  This is the current hospital active medication list Current Facility-Administered Medications  Medication Dose Route Frequency Provider Last Rate Last Admin  . 0.9 %  sodium chloride infusion (Manually program via Guardrails IV Fluids)   Intravenous Once Vann, Jessica U, DO      . febuxostat (ULORIC) tablet 40 mg  40 mg Oral Daily  Radene Gunning, NP   40 mg at 04/05/20 1340  . ferrous sulfate tablet 325 mg  325 mg Oral BID WC Radene Gunning, NP   325 mg at 04/05/20 1340  . midodrine (PROAMATINE) tablet 5 mg  5 mg Oral TID WC Vann, Jessica U, DO      . ondansetron (ZOFRAN) tablet 4 mg  4 mg Oral Q6H PRN Elwyn Reach, MD       Or  . ondansetron (ZOFRAN) injection 4 mg  4 mg Intravenous Q6H PRN Jonelle Sidle, Mohammad L, MD      . sodium chloride flush (NS) 0.9 % injection 3 mL  3 mL  Intravenous Q12H Elwyn Reach, MD   3 mL at 04/05/20 1339     Discharge Medications: Please see discharge summary for a list of discharge medications.  Relevant Imaging Results:  Relevant Lab Results:   Additional Information SS#[id number] [place], [place]

## 2020-04-06 ENCOUNTER — Encounter (HOSPITAL_COMMUNITY): Payer: Self-pay | Admitting: Internal Medicine

## 2020-04-06 DIAGNOSIS — I472 Ventricular tachycardia: Secondary | ICD-10-CM | POA: Diagnosis present

## 2020-04-06 DIAGNOSIS — I5042 Chronic combined systolic (congestive) and diastolic (congestive) heart failure: Secondary | ICD-10-CM | POA: Diagnosis present

## 2020-04-06 DIAGNOSIS — N1831 Chronic kidney disease, stage 3a: Secondary | ICD-10-CM | POA: Diagnosis present

## 2020-04-06 DIAGNOSIS — R55 Syncope and collapse: Secondary | ICD-10-CM | POA: Diagnosis present

## 2020-04-06 DIAGNOSIS — Z952 Presence of prosthetic heart valve: Secondary | ICD-10-CM | POA: Diagnosis not present

## 2020-04-06 DIAGNOSIS — I130 Hypertensive heart and chronic kidney disease with heart failure and stage 1 through stage 4 chronic kidney disease, or unspecified chronic kidney disease: Secondary | ICD-10-CM | POA: Diagnosis present

## 2020-04-06 DIAGNOSIS — Z79899 Other long term (current) drug therapy: Secondary | ICD-10-CM | POA: Diagnosis not present

## 2020-04-06 DIAGNOSIS — I495 Sick sinus syndrome: Secondary | ICD-10-CM | POA: Diagnosis present

## 2020-04-06 DIAGNOSIS — I2721 Secondary pulmonary arterial hypertension: Secondary | ICD-10-CM | POA: Diagnosis present

## 2020-04-06 DIAGNOSIS — I4821 Permanent atrial fibrillation: Secondary | ICD-10-CM | POA: Diagnosis present

## 2020-04-06 DIAGNOSIS — Z800 Family history of malignant neoplasm of digestive organs: Secondary | ICD-10-CM | POA: Diagnosis not present

## 2020-04-06 DIAGNOSIS — E785 Hyperlipidemia, unspecified: Secondary | ICD-10-CM | POA: Diagnosis present

## 2020-04-06 DIAGNOSIS — Z950 Presence of cardiac pacemaker: Secondary | ICD-10-CM | POA: Diagnosis not present

## 2020-04-06 DIAGNOSIS — D631 Anemia in chronic kidney disease: Secondary | ICD-10-CM | POA: Diagnosis present

## 2020-04-06 DIAGNOSIS — I428 Other cardiomyopathies: Secondary | ICD-10-CM | POA: Diagnosis present

## 2020-04-06 DIAGNOSIS — R112 Nausea with vomiting, unspecified: Secondary | ICD-10-CM | POA: Diagnosis present

## 2020-04-06 DIAGNOSIS — Z87891 Personal history of nicotine dependence: Secondary | ICD-10-CM | POA: Diagnosis not present

## 2020-04-06 DIAGNOSIS — I951 Orthostatic hypotension: Secondary | ICD-10-CM | POA: Diagnosis present

## 2020-04-06 DIAGNOSIS — Z20822 Contact with and (suspected) exposure to covid-19: Secondary | ICD-10-CM | POA: Diagnosis present

## 2020-04-06 DIAGNOSIS — K7460 Unspecified cirrhosis of liver: Secondary | ICD-10-CM | POA: Diagnosis present

## 2020-04-06 DIAGNOSIS — I442 Atrioventricular block, complete: Secondary | ICD-10-CM | POA: Diagnosis present

## 2020-04-06 LAB — BASIC METABOLIC PANEL
Anion gap: 8 (ref 5–15)
BUN: 27 mg/dL — ABNORMAL HIGH (ref 8–23)
CO2: 22 mmol/L (ref 22–32)
Calcium: 7.7 mg/dL — ABNORMAL LOW (ref 8.9–10.3)
Chloride: 111 mmol/L (ref 98–111)
Creatinine, Ser: 1.55 mg/dL — ABNORMAL HIGH (ref 0.44–1.00)
GFR calc Af Amer: 34 mL/min — ABNORMAL LOW (ref >60)
GFR calc non Af Amer: 29 mL/min — ABNORMAL LOW (ref >60)
Glucose, Bld: 93 mg/dL (ref 70–99)
Potassium: 4 mmol/L (ref 3.5–5.1)
Sodium: 141 mmol/L (ref 135–145)

## 2020-04-06 LAB — TYPE AND SCREEN
ABO/RH(D): A POS
Antibody Screen: NEGATIVE
Unit division: 0

## 2020-04-06 LAB — CBC
HCT: 30.9 % — ABNORMAL LOW (ref 36.0–46.0)
Hemoglobin: 9.3 g/dL — ABNORMAL LOW (ref 12.0–15.0)
MCH: 26.6 pg (ref 26.0–34.0)
MCHC: 30.1 g/dL (ref 30.0–36.0)
MCV: 88.5 fL (ref 80.0–100.0)
Platelets: 136 10*3/uL — ABNORMAL LOW (ref 150–400)
RBC: 3.49 MIL/uL — ABNORMAL LOW (ref 3.87–5.11)
RDW: 15.9 % — ABNORMAL HIGH (ref 11.5–15.5)
WBC: 6.1 10*3/uL (ref 4.0–10.5)
nRBC: 0 % (ref 0.0–0.2)

## 2020-04-06 LAB — BPAM RBC
Blood Product Expiration Date: 202106142359
ISSUE DATE / TIME: 202105201759
Unit Type and Rh: 6200

## 2020-04-06 LAB — GLUCOSE, CAPILLARY: Glucose-Capillary: 82 mg/dL (ref 70–99)

## 2020-04-06 MED ORDER — MIDODRINE HCL 5 MG PO TABS
5.0000 mg | ORAL_TABLET | Freq: Three times a day (TID) | ORAL | Status: DC
  Administered 2020-04-06 – 2020-04-07 (×5): 5 mg via ORAL
  Filled 2020-04-06 (×5): qty 1

## 2020-04-06 NOTE — Progress Notes (Signed)
Progress Note    [name]  TSV:[id number] DOB: [birth year]  DOA: 04/04/2020 PCP: [name], MD    Brief Narrative:    Medical records reviewed and are as summarized below:  [name] is an 86 y.o. female with past medical history significant for orthostatic hypotension, paroxysmal atrial fibrillation, hypertension, chronic kidney disease stage III, biventricular cardiac pacemaker, chronic diastolic heart failure, anemia, NSVT admitted May 19 chief complaint recurrent syncope.  Evaluated by cardiology.  Other findings anemia in setting of chronic kidney disease.  Transfused 1 unit of packed red blood cells May 20  Assessment/Plan:   Principal Problem:   Syncope and collapse Active Problems:   Permanent atrial fibrillation (HCC)   Orthostatic hypotension dysautonomic syndrome (HCC)   Nausea and vomiting   CKD (chronic kidney disease) stage 3, GFR 30-59 ml/min   Anemia   Biventricular cardiac pacemaker in situ   Chronic diastolic CHF (congestive heart failure) (HCC)   Hypertension   NSVT (nonsustained ventricular tachycardia) ([place])  #1.  Syncope.  Recurrent.  Echo with an EF of 40%.  No signs of infection.  No metabolic derangements. ekg with paced rythm   no issues with pacemaker.  She does have a history of orthostatic hypotension disautonomic syndrome.  Evaluated by cardiology who opined history of same and recommending reducing Demadex as well as abdominal binder.  Also increased midodrine.  Outpatient follow-up with Dr. Radford Pax scheduled for next week Home medications include midodrine.   Blood pressure improved this morning after blood.  Systolic did drop from 923-300 asymptomatic.  Complains of feeling weak.  Suspected deconditioned state contributing as well -Medications as noted above -Abdominal binder -Appreciate cardiology's assistance  #2.  Nausea and vomiting.  Patient had an episode of nausea and vomiting with standing and bowel movement.  Resolved.   Tolerating diet - zofran  -monitor  #3.  Chronic diastolic heart failure. Compensated medications include Demadex.  Echo as noted above.  Evaluated by cardiology recommends decreasing Demadex dose at discharge. -Continue to hold Demadex for now -Obtain daily weights -Monitor intake and output  #4.  Chronic kidney disease stage III.  Creatinine appears to be stable at baseline. -Hold nephrotoxins as able -Monitor urine output -Recheck in the morning  #5.  Hypertension.  Patient with a history of orthostatic hypotension dysautonomic syndrome. -See #1  #6.  Anemia likely related to chronic disease.  Status post 1 unit of packed red blood cells May 20.  Anemia panel reveals iron 24 otherwise within the limits of normal.  Hemoglobin 9.3 this morning up from 8.4 yesterday.  FOBT pending no sign symptoms of active bleeding -monitor    Family Communication/Anticipated D/C date and plan/Code Status   DVT prophylaxis: scd ordered. Code Status: Full Code.  Family Communication: daughter per dr Eliseo Squires Disposition Plan: Status is: Observation  The patient remains OBS appropriate and will d/c before 2 midnights.  Dispo: The patient is from: Home              Anticipated d/c is to: SNF              Anticipated d/c date is: 1 day              Patient currently is not medically stable to d/c.         Medical Consultants:    cardiology   Anti-Infectives:    None  Subjective:   Awake alert talking on the telephone.  Denies pain or discomfort.  Reports sleeping well Objective:    Vitals:   04/06/20 0017 04/06/20 0427 04/06/20 0801 04/06/20 1203  BP: (!) 113/52 104/75 (!) 123/55 (!) 126/50  Pulse: 72 70 69 72  Resp: 16 18 17 17   Temp: 98.1 F (36.7 C) 97.9 F (36.6 C) 98.2 F (36.8 C) 98.6 F (37 C)  TempSrc: Oral Oral Oral Oral  SpO2: 95% 96% 97% 95%  Weight:  87.4 kg    Height:        Intake/Output Summary (Last 24 hours) at 04/06/2020 1246 Last data filed at  04/05/2020 2109 Gross per 24 hour  Intake 591 ml  Output 150 ml  Net 441 ml   Filed Weights   04/05/20 0231 04/06/20 0427  Weight: 86.2 kg 87.4 kg    Exam: General: Well-nourished cooperative looks younger than her stated age no acute distress CV: Regular rate and rhythm no murmur gallop or rub no lower extremity edema pedal pulses present palpable Respiratory: No increased work of breathing breath sounds are clear bilaterally hear no rhonchi hear no wheezes.  Abdomen: Nondistended soft positive bowel sounds throughout nontender to palpation no guarding or rebounding Musculoskeletal: Joints without swelling/erythema full range of motion Neuro: Alert and oriented x3 speech clear facial symmetry moves all extremities spontaneously  Data Reviewed:   I have personally reviewed following labs and imaging studies:  Labs: Labs show the following:   Basic Metabolic Panel: Recent Labs  Lab 04/04/20 1221 04/04/20 1221 04/05/20 0535 04/06/20 0323  NA 145  --  144 141  K 3.6   < > 3.8 4.0  CL 113*  --  110 111  CO2 23  --  22 22  GLUCOSE 120*  --  102* 93  BUN 30*  --  30* 27*  CREATININE 1.80*  --  1.73* 1.55*  CALCIUM 8.2*  --  7.8* 7.7*   < > = values in this interval not displayed.   GFR Estimated Creatinine Clearance: 24.3 mL/min (A) (by C-G formula based on SCr of 1.55 mg/dL (H)). Liver Function Tests: Recent Labs  Lab 04/04/20 2001 04/05/20 0535  AST 19 16  ALT 12 11  ALKPHOS 82 74  BILITOT 0.9 0.9  PROT 6.1* 5.3*  ALBUMIN 3.0* 2.6*   No results for input(s): LIPASE, AMYLASE in the last 168 hours. No results for input(s): AMMONIA in the last 168 hours. Coagulation profile No results for input(s): INR, PROTIME in the last 168 hours.  CBC: Recent Labs  Lab 04/04/20 1221 04/05/20 0535 04/06/20 0323  WBC 7.6 5.7 6.1  HGB 9.3* 8.4* 9.3*  HCT 32.2* 28.7* 30.9*  MCV 89.2 86.7 88.5  PLT 188 146* 136*   Cardiac Enzymes: No results for input(s): CKTOTAL,  CKMB, CKMBINDEX, TROPONINI in the last 168 hours. BNP (last 3 results) No results for input(s): PROBNP in the last 8760 hours. CBG: Recent Labs  Lab 04/04/20 2011 04/05/20 0555 04/06/20 0555  GLUCAP 88 85 82   D-Dimer: No results for input(s): DDIMER in the last 72 hours. Hgb A1c: No results for input(s): HGBA1C in the last 72 hours. Lipid Profile: No results for input(s): CHOL, HDL, LDLCALC, TRIG, CHOLHDL, LDLDIRECT in the last 72 hours. Thyroid function studies: Recent Labs    04/04/20 2147  TSH 2.771   Anemia work up: Recent Labs    04/05/20 1556  VITAMINB12 415  FERRITIN 19  TIBC 328  IRON 24*   Sepsis Labs: Recent Labs  Lab 04/04/20 1221 04/05/20 0535 04/06/20 1308  WBC 7.6 5.7 6.1    Microbiology Recent Results (from the past 240 hour(s))  SARS Coronavirus 2 by RT PCR (hospital order, performed in [facility] hospital lab) Nasopharyngeal Nasopharyngeal Swab     Status: None   Collection Time: 04/04/20  9:02 PM   Specimen: Nasopharyngeal Swab  Result Value Ref Range Status   SARS Coronavirus 2 NEGATIVE NEGATIVE Final    Comment: (NOTE) SARS-CoV-2 target nucleic acids are NOT DETECTED. The SARS-CoV-2 RNA is generally detectable in upper and lower respiratory specimens during the acute phase of infection. The lowest concentration of SARS-CoV-2 viral copies this assay can detect is 250 copies / mL. A negative result does not preclude SARS-CoV-2 infection and should not be used as the sole basis for treatment or other patient management decisions.  A negative result may occur with improper specimen collection / handling, submission of specimen other than nasopharyngeal swab, presence of viral mutation(s) within the areas targeted by this assay, and inadequate number of viral copies (<250 copies / mL). A negative result must be combined with clinical observations, patient history, and epidemiological information. Fact Sheet for Patients:     StrictlyIdeas.no Fact Sheet for Healthcare Providers: BankingDealers.co.za This test is not yet approved or cleared  by the [place] FDA and has been authorized for detection and/or diagnosis of SARS-CoV-2 by FDA under an Emergency Use Authorization (EUA).  This EUA will remain in effect (meaning this test can be used) for the duration of the COVID-19 declaration under Section 564(b)(1) of the Act, 21 U.S.C. section 360bbb-3(b)(1), unless the authorization is terminated or revoked sooner. Performed at [facility], [place] [address], [place], [place] [address]     Procedures and diagnostic studies:  DG Chest 2 View  Result Date: 04/04/2020 CLINICAL DATA:  Syncopal episode. EXAM: CHEST - 2 VIEW COMPARISON:  PA and lateral chest 05/18/2018. FINDINGS: Three lead pacing device is in place and unchanged. The patient is status post mitral valve repair. There is cardiomegaly. Atherosclerosis noted. Lungs clear. No pneumothorax or pleural effusion. IMPRESSION: Cardiomegaly without acute disease. Aortic Atherosclerosis (ICD10-I70.0). Electronically Signed   By: Inge Rise M.D.   On: 04/04/2020 13:11   ECHOCARDIOGRAM COMPLETE  Result Date: 04/05/2020    ECHOCARDIOGRAM REPORT   Patient Name:   JOSLYN RAMOS Date of Exam: 04/05/2020 Medical Rec #:  [id number]      Height:       62.0 in Accession #:    [id number]     Weight:       190.0 lb Date of Birth:  1926-11-14     BSA:          1.870 m Patient Age:    89 years       BP:           113/51 mmHg Patient Gender: F              HR:           70 bpm. Exam Location:  Inpatient Procedure: 2D Echo Indications:    Syncope 780.2 / R55  History:        Patient has prior history of Echocardiogram examinations, most                 recent 12/01/2019. Arrythmias:Atrial Fibrillation; Risk                 Factors:Hypertension and Dyslipidemia. Orthostatic hypotension,  chronic kidney disease,  nonsustained ventricular tachycardia,                 Brady-tachy syndrome (.                  Mitral Valve: 28 mm Mitral ring prosthetic annuloplasty ring                 valve is present in the mitral position. Procedure Date: 2003.  Sonographer:    Darlina Sicilian RDCS Referring Phys: [place]  1. Left ventricular ejection fraction, by estimation, is 40%. The left ventricle has mildly decreased function. The left ventricle demonstrates global hypokinesis. The left ventricular internal cavity size was mildly dilated. Left ventricular diastolic parameters are indeterminate.  2. Right ventricular systolic function is moderately reduced. The right ventricular size is moderately enlarged. The estimated right ventricular systolic pressure is 62.6 mmHg.  3. Left atrial size was severely dilated.  4. Right atrial size was severely dilated.  5. The mitral valve has been repaired/replaced. Mild mitral valve regurgitation. No evidence of mitral stenosis however Doppler alignment of diastolic gradient is slightly suboptimal. The mean mitral valve gradient is 2.0 mmHg with average heart rate of  70 bpm.  6. Tricuspid valve regurgitation is severe.  7. The aortic valve is tricuspid. Aortic valve regurgitation is mild. No aortic stenosis is present.  8. The inferior vena cava is dilated in size with <50% respiratory variability, suggesting right atrial pressure of 15 mmHg. FINDINGS  Left Ventricle: Left ventricular ejection fraction, by estimation, is 40%. The left ventricle has mildly decreased function. The left ventricle demonstrates global hypokinesis. The left ventricular internal cavity size was mildly dilated. There is no left ventricular hypertrophy. Left ventricular diastolic parameters are indeterminate. Right Ventricle: The right ventricular size is moderately enlarged. No increase in right ventricular wall thickness. Right ventricular systolic function is moderately reduced. The tricuspid  regurgitant velocity is 2.41 m/s, and with an assumed right atrial pressure of 15 mmHg, the estimated right ventricular systolic pressure is 94.8 mmHg. Left Atrium: Left atrial size was severely dilated. Right Atrium: Right atrial size was severely dilated. Pericardium: A small pericardial effusion is present. Presence of pericardial fat pad. Mitral Valve: The mitral valve has been repaired/replaced. Mild mitral valve regurgitation. There is a 28 mm Mitral ring prosthetic annuloplasty ring present in the mitral position. Procedure Date: 2003. No evidence of mitral valve stenosis. MV peak gradient, 8.5 mmHg. The mean mitral valve gradient is 2.0 mmHg with average heart rate of 70 bpm. Tricuspid Valve: The tricuspid valve is grossly normal. Tricuspid valve regurgitation is severe. Aortic Valve: The aortic valve is tricuspid. . There is moderate thickening and mild calcification of the aortic valve. Aortic valve regurgitation is mild. No aortic stenosis is present. There is moderate thickening of the aortic valve. There is mild calcification of the aortic valve. Pulmonic Valve: The pulmonic valve was normal in structure. Pulmonic valve regurgitation is trivial. Aorta: The aortic root is normal in size and structure. Venous: The inferior vena cava is dilated in size with less than 50% respiratory variability, suggesting right atrial pressure of 15 mmHg. IAS/Shunts: No atrial level shunt detected by color flow Doppler. Additional Comments: A pacer wire is visualized in the right atrium and right ventricle.  LEFT VENTRICLE PLAX 2D LVIDd:         5.40 cm LVIDs:         5.10 cm LV PW:  0.90 cm LV IVS:        0.70 cm LVOT diam:     2.40 cm LV SV:         91 LV SV Index:   49 LVOT Area:     4.52 cm  LV Volumes (MOD) LV vol d, MOD A2C: 133.0 ml LV vol d, MOD A4C: 149.0 ml LV vol s, MOD A2C: 65.6 ml LV vol s, MOD A4C: 75.8 ml LV SV MOD A2C:     67.4 ml LV SV MOD A4C:     149.0 ml LV SV MOD BP:      70.7 ml RIGHT  VENTRICLE RV S prime:     8.32 cm/s LEFT ATRIUM            Index       RIGHT ATRIUM           Index LA diam:      6.00 cm  3.21 cm/m  RA Area:     32.30 cm LA Vol (A4C): 107.0 ml 57.21 ml/m RA Volume:   108.00 ml 57.74 ml/m  AORTIC VALVE LVOT Vmax:   101.00 cm/s LVOT Vmean:  73.800 cm/s LVOT VTI:    0.202 m  AORTA Ao Root diam: 3.40 cm MITRAL VALVE            TRICUSPID VALVE MV Peak grad: 8.5 mmHg  TR Peak grad:   23.2 mmHg MV Mean grad: 2.0 mmHg  TR Vmax:        241.00 cm/s MV Vmax:      1.46 m/s MV Vmean:     62.5 cm/s SHUNTS                         Systemic VTI:  0.20 m                         Systemic Diam: 2.40 cm Cherlynn Kaiser MD Electronically signed by Cherlynn Kaiser MD Signature Date/Time: 04/05/2020/1:33:41 PM    Final     Medications:   . febuxostat  40 mg Oral Daily  . ferrous sulfate  325 mg Oral BID WC  . midodrine  5 mg Oral TID WC  . sodium chloride flush  3 mL Intravenous Q12H   Continuous Infusions:   LOS: 0 days   Radene Gunning NP  [facility]   How to contact the [facility] Attending or Consulting provider [place] or covering provider during after hours [place], for this patient?  1. Check the care team in [facility] and look for a) attending/consulting [facility] provider listed and b) the [facility] team listed 2. Log into [email or web address] and use 's universal password to access. If you do not have the password, please contact the hospital operator. 3. Locate the [facility] provider you are looking for under [facility] and page to a number that you can be directly reached. 4. If you still have difficulty reaching the provider, please page the [facility] (Director on Call) for the Hospitalists listed on amion for assistance.  04/06/2020, 12:46 PM

## 2020-04-06 NOTE — Care Management (Signed)
Spoke to Rowena at [facility]. Olivia Mackie can offer a SNF bed. Last covid was 5/19 . [facility] does not require another covid test.   Called patient's daughter Santiago Glad. Santiago Glad has accepted bed at [facility] and aware if we receive insurance authorization discharge will be today.   Charlene Detter

## 2020-04-06 NOTE — Progress Notes (Signed)
Abdominal binder ordered by MD, ortho tech notified of order and will bring and apply/fit pt for abdominal binder.    Pt informed of MD orders for abdominal binder and why it was ordered. Pt verbalized understanding.

## 2020-04-06 NOTE — Care Management (Signed)
Spoke to [place] at [facility]. Patient has authorization for SNF starting 5/21 /21 to 04/10/20 . Next review date is 04/10/20 , reviews to go to Darnelle Bos Ref [id number],   Information given to Olivia Mackie at [facility] she can accept patient tomorrow.   Daughter Santiago Glad aware.   Magdalen Spatz RN

## 2020-04-06 NOTE — Progress Notes (Signed)
Blood transfusion completed without any RXN observed. Vital signs stable, will continue to monitor.

## 2020-04-06 NOTE — Progress Notes (Signed)
Occupational Therapy Treatment Patient Details Name: [name] MRN: [id number] DOB: August 15, 1926 Today's Date: 04/06/2020    History of present illness [name] is a very pleasant 86 y.o. female with a past medical history that includes orthostatic hypotension, paroxysmal atrial fibrillation, hypertension, chronic kidney disease stage III, biventricular cardiac pacemaker, chronic diastolic heart failure admitted May 19 chief complaint recurrent syncope.  Initial work-up unrevealing but telemetry reading with ?PVC's. Admitted for cardiology consultation, 2D echo, interrogation of her pacemaker.   OT comments  Patient supine in bed on arrival and wanting to use BSC.  Able to transfer with min assist but needing cues for safety and hand placement.  Patient benefits from using RW with mobility though she is reluctant to use and requires cues for safety.  Patient stood at sink to complete grooming with supervision.  Leans forearms on counter for balance and energy conservation.  BP still dropping with position change (see below), though patient reporting no symptoms of dizziness.  Sat soon after 3 min stand.  Will continue to follow with OT acutely to address the deficits listed below.   BP:  130/54 - laying 130/55 - sit 114/42 - stand 0 min 88/64 - stand 3 min 101/80 - sit   Follow Up Recommendations  Home health OT;Supervision/Assistance - 24 hour;Other (comment)    Equipment Recommendations  3 in 1 bedside commode    Recommendations for Other Services      Precautions / Restrictions Precautions Precautions: Fall Precaution Comments: orthostatic vitals Restrictions Weight Bearing Restrictions: No       Mobility Bed Mobility Overal bed mobility: Needs Assistance Bed Mobility: Supine to Sit;Sit to Supine     Supine to sit: Supervision Sit to supine: Supervision      Transfers Overall transfer level: Needs assistance Equipment used: Rolling walker (2  wheeled) Transfers: Sit to/from [facility] Sit to Stand: Min guard Stand pivot transfers: Min assist       General transfer comment: Needs cues for safe BSC transfer, hand placement    Balance Overall balance assessment: Needs assistance Sitting-balance support: Feet supported Sitting balance-Leahy Scale: Fair     Standing balance support: Bilateral upper extremity supported;During functional activity Standing balance-Leahy Scale: Poor Standing balance comment: Benefits from RW                           ADL either performed or assessed with clinical judgement   ADL Overall ADL's : Needs assistance/impaired     Grooming: Supervision/safety;Standing                   Toilet Transfer: Minimal assistance;BSC;Stand-pivot;Cueing for safety           Functional mobility during ADLs: Minimal assistance       Vision       Perception     Praxis      Cognition Arousal/Alertness: Awake/alert Behavior During Therapy: WFL for tasks assessed/performed Overall Cognitive Status: Within Functional Limits for tasks assessed                                          Exercises     Shoulder Instructions       General Comments      Pertinent Vitals/ Pain       Pain Assessment: No/denies pain  Home Living  Prior Functioning/Environment              Frequency  Min 3X/week        Progress Toward Goals  OT Goals(current goals can now be found in the care plan section)  Progress towards OT goals: Progressing toward goals  Acute Rehab OT Goals Patient Stated Goal: To use the batroom OT Goal Formulation: With patient Time For Goal Achievement: 04/19/20 Potential to Achieve Goals: Good  Plan Discharge plan remains appropriate    Co-evaluation                 AM-PAC OT "6 Clicks" Daily Activity     Outcome Measure   Help from another  person eating meals?: None Help from another person taking care of personal grooming?: A Little Help from another person toileting, which includes using toliet, bedpan, or urinal?: A Little Help from another person bathing (including washing, rinsing, drying)?: A Little Help from another person to put on and taking off regular upper body clothing?: A Little Help from another person to put on and taking off regular lower body clothing?: A Little 6 Click Score: 19    End of Session Equipment Utilized During Treatment: Gait belt;Rolling walker  OT Visit Diagnosis: History of falling (Z91.81);Other abnormalities of gait and mobility (R26.89)   Activity Tolerance Patient tolerated treatment well   Patient Left in bed;with call bell/phone within reach   Nurse Communication Mobility status        Time: 5277-8242 OT Time Calculation (min): 23 min  Charges: OT General Charges $OT Visit: 1 Visit OT Treatments $Self Care/Home Management : 23-37 mins  August Luz, OTR/L    Phylliss Bob 04/06/2020, 3:08 PM

## 2020-04-07 MED ORDER — TRAMADOL HCL 50 MG PO TABS
50.0000 mg | ORAL_TABLET | Freq: Once | ORAL | Status: AC
  Administered 2020-04-07: 50 mg via ORAL
  Filled 2020-04-07: qty 1

## 2020-04-07 MED ORDER — MIDODRINE HCL 5 MG PO TABS: 5.0000 mg | ORAL_TABLET | Freq: Three times a day (TID) | ORAL | Status: DC

## 2020-04-07 MED ORDER — POTASSIUM CHLORIDE CRYS ER 20 MEQ PO TBCR: 20.0000 meq | EXTENDED_RELEASE_TABLET | Freq: Every day | ORAL | 2 refills | Status: AC

## 2020-04-07 MED ORDER — TORSEMIDE 20 MG PO TABS: 20.0000 mg | ORAL_TABLET | Freq: Every day | ORAL | 3 refills | Status: DC

## 2020-04-07 NOTE — Progress Notes (Signed)
Assumed care from [facility], [place], will continue to monitor.

## 2020-04-07 NOTE — Progress Notes (Signed)
Gave report to RN at [facility].

## 2020-04-07 NOTE — TOC Transition Note (Signed)
Transition of Care [facility]) - CM/SW Discharge Note   Patient Details  Name: [name] MRN: [id number] Date of Birth: 1926/03/01  [facility] of [facility] CM/SW Contact:  Oretha Milch, LCSW Phone Number: 04/07/2020, 11:31 AM   Clinical Narrative:    Patient will discharge to: [place] Discharge date: 04/07/2020 Family notified: daughter Transport by: Corey Harold  Per MD patient is appropriate for discharge and will discharge to [facility] in room 903A on the [facility] building. RN, patient, patient's family, and facility have been notified of discharge. Assessment, FL-2, PASRR, and discharge summary sent to facillity. RN was provided with the following number for report: [phone]. PTAR will be used to transfer patient to the facility. CSW will continue to follow for any discharge supports.  Daphine Deutscher, LCSW, [place] Social Worker II Emergency Department, [place], [facility] [phone]      Barriers to Discharge: Barriers Resolved   Patient Goals and CMS Choice Patient states their goals for this hospitalization and ongoing recovery are:: to return to home CMS Medicare.gov Compare Post Acute Care list provided to:: Patient Choice offered to / list presented to : Patient  Discharge Placement                       Discharge Plan and Services   Discharge Planning Services: CM Consult Post Acute Care Choice: Home Health, Durable Medical Equipment          DME Arranged: 3-N-1 DME Agency: [employer] Date DME Agency Contacted: 04/05/20 Time DME Agency Contacted: 1250 Representative spoke with at DME Agency: [facility] HH Arranged: PT, OT, Nurse's Aide          Social Determinants of Health ([place]) Interventions     Readmission Risk Interventions No flowsheet data found.

## 2020-04-07 NOTE — Discharge Summary (Signed)
Physician Discharge Summary  [name] OVZ:[id number] DOB: 1926/11/08 DOA: 04/04/2020  PCP: [name], MD  Admit date: 04/04/2020 Discharge date: 04/07/2020  Admitted From: Home Discharge disposition: SNF   Recommendations for Outpatient Follow-Up:   1. Abdominal binder and stockings when up 2. BMP and CBC 1 week 3. Will need outpatient referral to hematology or nephrology for possible Epogen or Procrit injections for chronic kidney disease 4. Outpatient GI follow-up if occult stools are positive   Discharge Diagnosis:   Principal Problem:   Syncope and collapse Active Problems:   Nausea and vomiting   CKD (chronic kidney disease) stage 3, GFR 30-59 ml/min   Anemia   Biventricular cardiac pacemaker in situ   Permanent atrial fibrillation (HCC)   Chronic diastolic CHF (congestive heart failure) (HCC)   Orthostatic hypotension   Hypertension   NSVT (nonsustained ventricular tachycardia) (HCC)   Orthostatic hypotension dysautonomic syndrome ([place])    Discharge Condition: Improved.  Diet recommendation: Low sodium, heart healthy  Wound care: None.  Code status: Full.   History of Present Illness:   [name] is an 86 y.o. female with past medical history significant for orthostatic hypotension, paroxysmal atrial fibrillation, hypertension, chronic kidney disease stage III, biventricular cardiac pacemaker, chronic diastolic heart failure, anemia, NSVT admitted May 19 chief complaint recurrent syncope.  Evaluated by cardiology.  Other findings anemia in setting of chronic kidney disease.  Transfused 1 unit of packed red blood cells May 20   Hospital Course by Problem:   #1. Syncope. Recurrent.  Echo with an EF of 40%. No signs of infection. No metabolic derangements. ekg with paced rythm no issues with pacemaker. She does have a history of orthostatic hypotension disautonomic syndrome.  Evaluated by cardiology who opined history of same and  recommending reducing Demadex as well as abdominal binder.  Also increased midodrine.  Outpatient follow-up with Dr. Radford Pax scheduled for next week  #2. Nausea and vomiting. Patient had an episode of nausea and vomiting with standing and bowel movement.  Resolved.  Tolerating diet  #3. Chronic diastolic heart failure. Compensatedmedications include Demadex.  Echo as noted above.  Evaluated by cardiology recommends decreasing Demadex dose at discharge.  #4. Chronic kidney disease stage IIIa. Creatinine appears to be stable at baseline.  #5. Hypertension. Patient with a history of orthostatic hypotension dysautonomic syndrome.  #6.  Anemia likely related to chronic disease.  Status post 1 unit of packed red blood cells May 20.  Anemia panel reveals iron 24 otherwise within the limits of normal.  Hemoglobin 9.3 this morning up from 8.4 yesterday.  FOBT pending no sign symptoms of active bleeding -monitor    Medical Consultants:    Cardiology  Discharge Exam:   Vitals:   04/07/20 0030 04/07/20 0503  BP: (!) 118/47 121/81  Pulse: 70 75  Resp: 16 16  Temp: 98 F (36.7 C) 97.7 F (36.5 C)  SpO2: 96% 96%   Vitals:   04/06/20 1559 04/06/20 2006 04/07/20 0030 04/07/20 0503  BP: (!) 129/58 (!) 127/48 (!) 118/47 121/81  Pulse: 70 66 70 75  Resp: 17 18 16 16   Temp: 98.3 F (36.8 C) 98.2 F (36.8 C) 98 F (36.7 C) 97.7 F (36.5 C)  TempSrc: Oral Oral Oral Oral  SpO2: 97% 97% 96% 96%  Weight:      Height:        General exam: Appears calm and comfortable.     The results of significant diagnostics from  this hospitalization (including imaging, microbiology, ancillary and laboratory) are listed below for reference.     Procedures and Diagnostic Studies:   DG Chest 2 View  Result Date: 04/04/2020 CLINICAL DATA:  Syncopal episode. EXAM: CHEST - 2 VIEW COMPARISON:  PA and lateral chest 05/18/2018. FINDINGS: Three lead pacing device is in place and unchanged. The  patient is status post mitral valve repair. There is cardiomegaly. Atherosclerosis noted. Lungs clear. No pneumothorax or pleural effusion. IMPRESSION: Cardiomegaly without acute disease. Aortic Atherosclerosis (ICD10-I70.0). Electronically Signed   By: Inge Rise M.D.   On: 04/04/2020 13:11   ECHOCARDIOGRAM COMPLETE  Result Date: 04/05/2020    ECHOCARDIOGRAM REPORT   Patient Name:   [name] Date of Exam: 04/05/2020 Medical Rec #:  [id number]      Height:       62.0 in Accession #:    [id number]     Weight:       190.0 lb Date of Birth:  1926-01-07     BSA:          1.870 m Patient Age:    86 years       BP:           113/51 mmHg Patient Gender: F              HR:           70 bpm. Exam Location:  Inpatient Procedure: 2D Echo Indications:    Syncope 780.2 / R55  History:        Patient has prior history of Echocardiogram examinations, most                 recent 12/01/2019. Arrythmias:Atrial Fibrillation; Risk                 Factors:Hypertension and Dyslipidemia. Orthostatic hypotension,                 chronic kidney disease, nonsustained ventricular tachycardia,                 Brady-tachy syndrome (.                  Mitral Valve: 28 mm Mitral ring prosthetic annuloplasty ring                 valve is present in the mitral position. Procedure Date: 2003.  Sonographer:    Darlina Sicilian RDCS Referring Phys: [place]  1. Left ventricular ejection fraction, by estimation, is 40%. The left ventricle has mildly decreased function. The left ventricle demonstrates global hypokinesis. The left ventricular internal cavity size was mildly dilated. Left ventricular diastolic parameters are indeterminate.  2. Right ventricular systolic function is moderately reduced. The right ventricular size is moderately enlarged. The estimated right ventricular systolic pressure is 38.2 mmHg.  3. Left atrial size was severely dilated.  4. Right atrial size was severely dilated.  5. The mitral  valve has been repaired/replaced. Mild mitral valve regurgitation. No evidence of mitral stenosis however Doppler alignment of diastolic gradient is slightly suboptimal. The mean mitral valve gradient is 2.0 mmHg with average heart rate of  70 bpm.  6. Tricuspid valve regurgitation is severe.  7. The aortic valve is tricuspid. Aortic valve regurgitation is mild. No aortic stenosis is present.  8. The inferior vena cava is dilated in size with <50% respiratory variability, suggesting right atrial pressure of 15 mmHg. FINDINGS  Left Ventricle: Left ventricular ejection fraction, by estimation,  is 40%. The left ventricle has mildly decreased function. The left ventricle demonstrates global hypokinesis. The left ventricular internal cavity size was mildly dilated. There is no left ventricular hypertrophy. Left ventricular diastolic parameters are indeterminate. Right Ventricle: The right ventricular size is moderately enlarged. No increase in right ventricular wall thickness. Right ventricular systolic function is moderately reduced. The tricuspid regurgitant velocity is 2.41 m/s, and with an assumed right atrial pressure of 15 mmHg, the estimated right ventricular systolic pressure is 16.1 mmHg. Left Atrium: Left atrial size was severely dilated. Right Atrium: Right atrial size was severely dilated. Pericardium: A small pericardial effusion is present. Presence of pericardial fat pad. Mitral Valve: The mitral valve has been repaired/replaced. Mild mitral valve regurgitation. There is a 28 mm Mitral ring prosthetic annuloplasty ring present in the mitral position. Procedure Date: 2003. No evidence of mitral valve stenosis. MV peak gradient, 8.5 mmHg. The mean mitral valve gradient is 2.0 mmHg with average heart rate of 70 bpm. Tricuspid Valve: The tricuspid valve is grossly normal. Tricuspid valve regurgitation is severe. Aortic Valve: The aortic valve is tricuspid. . There is moderate thickening and mild calcification  of the aortic valve. Aortic valve regurgitation is mild. No aortic stenosis is present. There is moderate thickening of the aortic valve. There is mild calcification of the aortic valve. Pulmonic Valve: The pulmonic valve was normal in structure. Pulmonic valve regurgitation is trivial. Aorta: The aortic root is normal in size and structure. Venous: The inferior vena cava is dilated in size with less than 50% respiratory variability, suggesting right atrial pressure of 15 mmHg. IAS/Shunts: No atrial level shunt detected by color flow Doppler. Additional Comments: A pacer wire is visualized in the right atrium and right ventricle.  LEFT VENTRICLE PLAX 2D LVIDd:         5.40 cm LVIDs:         5.10 cm LV PW:         0.90 cm LV IVS:        0.70 cm LVOT diam:     2.40 cm LV SV:         91 LV SV Index:   49 LVOT Area:     4.52 cm  LV Volumes (MOD) LV vol d, MOD A2C: 133.0 ml LV vol d, MOD A4C: 149.0 ml LV vol s, MOD A2C: 65.6 ml LV vol s, MOD A4C: 75.8 ml LV SV MOD A2C:     67.4 ml LV SV MOD A4C:     149.0 ml LV SV MOD BP:      70.7 ml RIGHT VENTRICLE RV S prime:     8.32 cm/s LEFT ATRIUM            Index       RIGHT ATRIUM           Index LA diam:      6.00 cm  3.21 cm/m  RA Area:     32.30 cm LA Vol (A4C): 107.0 ml 57.21 ml/m RA Volume:   108.00 ml 57.74 ml/m  AORTIC VALVE LVOT Vmax:   101.00 cm/s LVOT Vmean:  73.800 cm/s LVOT VTI:    0.202 m  AORTA Ao Root diam: 3.40 cm MITRAL VALVE            TRICUSPID VALVE MV Peak grad: 8.5 mmHg  TR Peak grad:   23.2 mmHg MV Mean grad: 2.0 mmHg  TR Vmax:        241.00 cm/s MV Vmax:  1.46 m/s MV Vmean:     62.5 cm/s SHUNTS                         Systemic VTI:  0.20 m                         Systemic Diam: 2.40 cm Cherlynn Kaiser MD Electronically signed by Cherlynn Kaiser MD Signature Date/Time: 04/05/2020/1:33:41 PM    Final      Labs:   Basic Metabolic Panel: Recent Labs  Lab 04/04/20 1221 04/04/20 1221 04/05/20 0535 04/06/20 0323  NA 145  --  144 141  K 3.6    < > 3.8 4.0  CL 113*  --  110 111  CO2 23  --  22 22  GLUCOSE 120*  --  102* 93  BUN 30*  --  30* 27*  CREATININE 1.80*  --  1.73* 1.55*  CALCIUM 8.2*  --  7.8* 7.7*   < > = values in this interval not displayed.   GFR Estimated Creatinine Clearance: 24.3 mL/min (A) (by C-G formula based on SCr of 1.55 mg/dL (H)). Liver Function Tests: Recent Labs  Lab 04/04/20 2001 04/05/20 0535  AST 19 16  ALT 12 11  ALKPHOS 82 74  BILITOT 0.9 0.9  PROT 6.1* 5.3*  ALBUMIN 3.0* 2.6*   No results for input(s): LIPASE, AMYLASE in the last 168 hours. No results for input(s): AMMONIA in the last 168 hours. Coagulation profile No results for input(s): INR, PROTIME in the last 168 hours.  CBC: Recent Labs  Lab 04/04/20 1221 04/05/20 0535 04/06/20 0323  WBC 7.6 5.7 6.1  HGB 9.3* 8.4* 9.3*  HCT 32.2* 28.7* 30.9*  MCV 89.2 86.7 88.5  PLT 188 146* 136*   Cardiac Enzymes: No results for input(s): CKTOTAL, CKMB, CKMBINDEX, TROPONINI in the last 168 hours. BNP: Invalid input(s): POCBNP CBG: Recent Labs  Lab 04/04/20 2011 04/05/20 0555 04/06/20 0555  GLUCAP 88 85 82   D-Dimer No results for input(s): DDIMER in the last 72 hours. Hgb A1c No results for input(s): HGBA1C in the last 72 hours. Lipid Profile No results for input(s): CHOL, HDL, LDLCALC, TRIG, CHOLHDL, LDLDIRECT in the last 72 hours. Thyroid function studies Recent Labs    04/04/20 2147  TSH 2.771   Anemia work up Recent Labs    04/05/20 1556  VITAMINB12 415  FERRITIN 19  TIBC 328  IRON 24*   Microbiology Recent Results (from the past 240 hour(s))  SARS Coronavirus 2 by RT PCR (hospital order, performed in [facility] hospital lab) Nasopharyngeal Nasopharyngeal Swab     Status: None   Collection Time: 04/04/20  9:02 PM   Specimen: Nasopharyngeal Swab  Result Value Ref Range Status   SARS Coronavirus 2 NEGATIVE NEGATIVE Final    Comment: (NOTE) SARS-CoV-2 target nucleic acids are NOT DETECTED. The  SARS-CoV-2 RNA is generally detectable in upper and lower respiratory specimens during the acute phase of infection. The lowest concentration of SARS-CoV-2 viral copies this assay can detect is 250 copies / mL. A negative result does not preclude SARS-CoV-2 infection and should not be used as the sole basis for treatment or other patient management decisions.  A negative result may occur with improper specimen collection / handling, submission of specimen other than nasopharyngeal swab, presence of viral mutation(s) within the areas targeted by this assay, and inadequate number of viral copies (<250 copies / mL).  A negative result must be combined with clinical observations, patient history, and epidemiological information. Fact Sheet for Patients:   StrictlyIdeas.no Fact Sheet for Healthcare Providers: BankingDealers.co.za This test is not yet approved or cleared  by the [place] FDA and has been authorized for detection and/or diagnosis of SARS-CoV-2 by FDA under an Emergency Use Authorization (EUA).  This EUA will remain in effect (meaning this test can be used) for the duration of the COVID-19 declaration under Section 564(b)(1) of the Act, 21 U.S.C. section 360bbb-3(b)(1), unless the authorization is terminated or revoked sooner. Performed at [facility], [place] [address] [place], [place] [address]      Discharge Instructions:   Discharge Instructions    (HEART FAILURE PATIENTS) Call MD:  Anytime you have any of the following symptoms: 1) 3 pound weight gain in 24 hours or 5 pounds in 1 week 2) shortness of breath, with or without a dry hacking cough 3) swelling in the hands, feet or stomach 4) if you have to sleep on extra pillows at night in order to breathe.   Complete by: As directed    Diet - low sodium heart healthy   Complete by: As directed    Increase activity slowly   Complete by: As directed      Allergies  as of 04/07/2020   No Known Allergies     Medication List    TAKE these medications   acetaminophen 500 MG tablet Commonly known as: TYLENOL Take 1,000 mg by mouth every 6 (six) hours as needed for mild pain.   colchicine 0.6 MG tablet Take 0.6 mg by mouth as needed (Gout flare).   Febuxostat 80 MG Tabs Take 80 mg by mouth daily.   ferrous sulfate 325 (65 FE) MG tablet Take 1 tablet (325 mg total) by mouth 2 (two) times daily with a meal.   midodrine 5 MG tablet Commonly known as: PROAMATINE Take 1 tablet (5 mg total) by mouth 3 (three) times daily with meals. What changed:   medication strength  See the new instructions.   multivitamin with minerals Tabs tablet Take 1 tablet by mouth daily.   potassium chloride SA 20 MEQ tablet Commonly known as: KLOR-CON Take 1 tablet (20 mEq total) by mouth daily. What changed: when to take this   PRESERVISION AREDS 2 PO Take 2 tablets by mouth daily.   torsemide 20 MG tablet Commonly known as: DEMADEX Take 1 tablet (20 mg total) by mouth daily. What changed: See the new instructions.            Durable Medical Equipment  (From admission, onward)         Start     Ordered   04/05/20 1237  For home use only DME 3 n 1  Once     04/05/20 1237          Contact information for follow-up providers    [name], MD Follow up in 1 week(s).   Specialty: Internal Medicine Contact information: [address] [place] [address] [phone]        Sueanne Margarita, MD .   Specialty: Cardiology Contact information: [id number] [place] [address] [phone]            Contact information for after-discharge care    Destination    [facility] Preferred SNF .   Service: Skilled Chiropodist information: [address] [place] [phone]  Time coordinating discharge: 35  minutes  Signed:  Geradine Girt DO  [facility] 04/07/2020, 8:40 AM

## 2020-04-10 ENCOUNTER — Ambulatory Visit: Payer: Medicare Other | Admitting: Cardiology

## 2020-04-10 NOTE — Progress Notes (Signed)
Date:  04/11/2020   ID:  [name], DOB 12-Aug-1926, MRN [id number]  PCP:  [name], MD  Cardiologist:  [name], MD  Electrophysiologist:  None   Chief Complaint:  MR/MS, permanent atrial fibrillation, CHF  History of Present Illness:    [name] is a 86 y.o. female who presents via audio/video conferencing for a telehealth visit today.    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 Most recentechocardiogram, 04/2016,showed an improved EF of 50-55% with mild to moderate mitral stenosis. Due to hypotension she isunable to tolerate BB or ACE- I. She has a hx of syncope related to orthostasis and dehydration and is on Midodrine.  She is here today for followup and is doing well.  She denies any chest pain or pressure, SOB, DOE, PND, orthopnea, LE edema, dizziness, palpitations or syncope. She is compliant with her meds and is tolerating meds with no SE.    Prior CV studies:   The following studies were reviewed today:  IMPRESSIONS  1. Left ventricular ejection fraction, by estimation, is 40%. The left  ventricle has mildly decreased function. The left ventricle demonstrates  global hypokinesis. The left ventricular internal cavity size was mildly  dilated. Left ventricular diastolic  parameters are indeterminate.  2. Right ventricular systolic function is moderately reduced. The right  ventricular size is moderately enlarged. The estimated right ventricular  systolic pressure is 79.0 mmHg.  3. Left atrial size was severely dilated.  4. Right atrial size was severely dilated.  5. The mitral valve has been repaired/replaced. Mild mitral  valve  regurgitation. No evidence of mitral stenosis however Doppler alignment of  diastolic gradient is slightly suboptimal. The mean mitral valve gradient  is 2.0 mmHg with average heart rate of  70 bpm.  6. Tricuspid valve regurgitation is severe.  7. The aortic valve is tricuspid. Aortic valve regurgitation is mild. No  aortic stenosis is present.  8. The inferior vena cava is dilated in size with <50% respiratory  variability, suggesting right atrial pressure of 15 mmHg.   Past Medical History:  Diagnosis Date  . Anemia   . Brady-tachy syndrome ([place])    a. 2003: post-op afib after MR repair then symptomatic bradycardia requiring pacemaker. b. Upgrade to Medtronic Bi-V Pacemaker 2007.  . Cellulitis and abscess of foot 08/15/2015   RT FOOT  . Chronic atrial fibrillation ([place])    a. Previously failed DCCV/amio/tikosyn.  CHADS2VASC score 5 - not on anticoagulation due to history of GI bleeding.    . Chronic combined systolic and diastolic CHF (congestive heart failure) (HCC)    a. EF 35-40% on echo 09/2013 b. echo 04/2016: EF 50-55% with mild to moderate MS.  . Cirrhosis ([place])    a. Newly recognized 09/2013 - by CT.  . CKD (chronic kidney disease)   . GI bleeding   . Hypertension   . Mitral valve disorder    a. Severe MR s/p repair 2003 (28 mm annuloplasty ring and and oversew of LAA). No CAD by cath at that time.  [name] NSVT (nonsustained ventricular tachycardia) ([place])    a. Isolated event during 09/2007 adm.  . NSVT (nonsustained ventricular tachycardia) ([place])   .  Orthostatic hypotension dysautonomic syndrome ([place])   . Pulmonary hypertension (HCC)    Group II secondary to CHF and MV disease  . Spinal stenosis    shes recieved epidural steroid injections in the past  . Tricuspid regurgitation    Past Surgical History:  Procedure Laterality Date  . APPENDECTOMY    . BIV PACEMAKER GENERATOR CHANGE OUT N/A 12/13/2014   Procedure: BIV PACEMAKER GENERATOR CHANGE OUT;  Surgeon:  Evans Lance, MD;  Location[facility];  Service: Cardiovascular;  Laterality: N/A;  . CHOLECYSTECTOMY    . IRRIGATION AND DEBRIDEMENT ABSCESS N/A 10/14/2013   Procedure: IRRIGATION AND DEBRIDEMENT PERINEAL ABSCESS;  Surgeon: Rolm Bookbinder, MD;  Location: [place];  Service: General;  Laterality: N/A;  . VALVE REPLACEMENT     sever mitral regurgitation s/p mitral valve annuloplasty ring     Current Meds  Medication Sig  . acetaminophen (TYLENOL) 500 MG tablet Take 1,000 mg by mouth every 6 (six) hours as needed for mild pain.   [name] colchicine 0.6 MG tablet Take 0.6 mg by mouth as needed (Gout flare).  . Febuxostat 80 MG TABS Take 80 mg by mouth daily.  . ferrous sulfate 325 (65 FE) MG tablet Take 1 tablet (325 mg total) by mouth 2 (two) times daily with a meal.  . midodrine (PROAMATINE) 5 MG tablet Take 1 tablet (5 mg total) by mouth 3 (three) times daily with meals.  . Multiple Vitamin (MULTIVITAMIN WITH MINERALS) TABS tablet Take 1 tablet by mouth daily.  . Multiple Vitamins-Minerals (PRESERVISION AREDS 2 PO) Take 2 tablets by mouth daily.  . potassium chloride SA (KLOR-CON) 20 MEQ tablet Take 1 tablet (20 mEq total) by mouth daily.  [name] torsemide (DEMADEX) 20 MG tablet Take 1 tablet (20 mg total) by mouth daily.     Allergies:   Patient has no known allergies.   Social History   Tobacco Use  . Smoking status: Former Smoker    Quit date: 10/11/1971    Years since quitting: 48.5  . Smokeless tobacco: Never Used  Substance Use Topics  . Alcohol use: Yes    Comment: rare  . Drug use: No     Family Hx: [name] family history includes Other in her mother; Pancreatic cancer in her father.  ROS:   Please see the history of present illness.     All other systems reviewed and are negative.   Labs/Other Tests and Data Reviewed:    Recent Labs: 04/04/2020: B Natriuretic Peptide 162.5; TSH 2.771 04/05/2020: ALT 11 04/06/2020: BUN 27; Creatinine, Ser 1.55; Hemoglobin 9.3;  Platelets 136; Potassium 4.0; Sodium 141   Recent Lipid Panel No results found for: CHOL, TRIG, HDL, CHOLHDL, LDLCALC, LDLDIRECT  Wt Readings from Last 3 Encounters:  04/11/20 184 lb (83.5 kg)  04/06/20 192 lb 10.9 oz (87.4 kg)  10/18/19 200 lb (90.7 kg)     Objective:    Vital Signs:  BP 130/78   Pulse 78   Ht 5\' 2"  (1.575 m)   Wt 184 lb (83.5 kg)   LMP 10/10/2013   SpO2 97%   BMI 33.65 kg/m    GEN: Well nourished, well developed in no acute distress HEENT: Normal NECK: No JVD; No carotid bruits LYMPHATICS: No lymphadenopathy CARDIAC:RRR, no murmurs, rubs, gallops RESPIRATORY:  Clear to auscultation without rales, wheezing or rhonchi  ABDOMEN: Soft, non-tender, non-distended MUSCULOSKELETAL:  No edema; No deformity  SKIN: Warm and dry NEUROLOGIC:  Alert and oriented x 3 PSYCHIATRIC:  Normal affect     ASSESSMENT & PLAN:    1.  Permanent atrial fibrillation  -HR is well controlled -denies any palpitations -not a candidate for longterm anticoagulation due to hx of GI bleed -not on BB or CCB due to hx of orthostatic hypotension and HR has been adequately controlled    2.  Chronic combined systolic/diastolic CHF  -she appears euvolemic on exam today -denies any SOB or LE edema and weight is stable and she has actually lost weight in rehab -continue Demadex 20mg  daily -creatinine stable after review from outside labs with Creatinine 1.55 and K+ 4.  3.  Orthostatic hypotension  -she has a remote history of syncope in the setting of dehydration.   -she is on Midodrin and has not had any dizziness. -she recently was hospitalized with syncope felt related to orthostatic hypotension exacerbated by anemia and volume depletion -diuretics were decreased and BP improved -continue Midodrine 5mg  TID  4.  Nonsustained ventricular tachycardia  -denies any palpitations - EF 50 to 55% on echo 2017.   -2D echo with EF 40-45% by recent echo>>she has not had any anginal sx or  CHF sx.  5.  Severe mitral regurgitation  -s/p mitral valve repair in 2003.   -echo 03/2020 showed mild MR with no MS  -She is completely asymptomatic.    6.  Chronic kidney disease stage III -her creatinine was 1.55 earlier this month  7.  Severe TR -given advanced age would not pursue further workup of this  8.  Anemia -noted to have Hbg in the 8 range on recent admit -received 1 unit PRBCs -will get her back in to see her PCP next weekto followup on this  Medication Adjustments/Labs and Tests Ordered: Current medicines are reviewed at length with the patient today.  Concerns regarding medicines are outlined above.  Tests Ordered: No orders of the defined types were placed in this encounter.  Medication Changes: No orders of the defined types were placed in this encounter.   Disposition:  Follow up in 6 month(s)  Signed, [name], MD  04/11/2020 10:44 AM    [facility]

## 2020-04-11 ENCOUNTER — Encounter: Payer: Self-pay | Admitting: Cardiology

## 2020-04-11 ENCOUNTER — Other Ambulatory Visit: Payer: Self-pay

## 2020-04-11 ENCOUNTER — Ambulatory Visit (INDEPENDENT_AMBULATORY_CARE_PROVIDER_SITE_OTHER): Payer: Medicare Other | Admitting: Cardiology

## 2020-04-11 VITALS — BP 130/78 | HR 78 | Ht 62.0 in | Wt 184.0 lb

## 2020-04-11 DIAGNOSIS — N1831 Chronic kidney disease, stage 3a: Secondary | ICD-10-CM

## 2020-04-11 DIAGNOSIS — I951 Orthostatic hypotension: Secondary | ICD-10-CM | POA: Diagnosis not present

## 2020-04-11 DIAGNOSIS — I4729 Other ventricular tachycardia: Secondary | ICD-10-CM

## 2020-04-11 DIAGNOSIS — I472 Ventricular tachycardia: Secondary | ICD-10-CM | POA: Diagnosis not present

## 2020-04-11 DIAGNOSIS — D631 Anemia in chronic kidney disease: Secondary | ICD-10-CM

## 2020-04-11 DIAGNOSIS — I4821 Permanent atrial fibrillation: Secondary | ICD-10-CM

## 2020-04-11 DIAGNOSIS — N189 Chronic kidney disease, unspecified: Secondary | ICD-10-CM

## 2020-04-11 DIAGNOSIS — I5032 Chronic diastolic (congestive) heart failure: Secondary | ICD-10-CM | POA: Diagnosis not present

## 2020-04-11 DIAGNOSIS — I059 Rheumatic mitral valve disease, unspecified: Secondary | ICD-10-CM

## 2020-04-11 DIAGNOSIS — I071 Rheumatic tricuspid insufficiency: Secondary | ICD-10-CM

## 2020-04-11 NOTE — Patient Instructions (Signed)
Medication Instructions:  ?Your physician recommends that you continue on your current medications as directed. Please refer to the Current Medication list given to you today. ? ?*If you need a refill on your cardiac medications before your next appointment, please call your pharmacy* ? ?Follow-Up: ?At [facility], you and your health needs are our priority.  As part of our continuing mission to provide you with exceptional heart care, we have created designated Provider Care Teams.  These Care Teams include your primary Cardiologist (physician) and Advanced Practice Providers (APPs -  Physician Assistants and Nurse Practitioners) who all work together to provide you with the care you need, when you need it.  ? ?Your next appointment:   ?3 month(s) ? ?The format for your next appointment:   ?In Person ? ?Provider:   ?Traci Turner, MD ? ?

## 2020-04-23 NOTE — Telephone Encounter (Signed)
Patient's daughter called back and states that when she picked her mom up on Friday 06/04 from nursing facility she had gained 10 lbs since her visit with Dr. Radford Pax on 05/27 (one week prior). She has had increased swelling in her feet and legs. She has been giving her 40 mg of torsemide for the past three days. Her weight has dropped 3 pounds and swelling has improved since doubling the dosage. She states swelling is still present though and patient is having some slight SOB. She denies chest pain and any other symptoms. She has been elevating her legs.  She states that when patient was hospitalized that the torsemide was reduced from 40mg  to 20mg  due to low BP. [name] daughter states that her blood pressures have been running good recently. This morning BP was 117/76. The daughter will continue to monitor and advised that I would call back with recommendations from Dr. Radford Pax.

## 2020-04-23 NOTE — Telephone Encounter (Signed)
Left message for patient's daughter to call back in regards to MyChart message.

## 2020-04-24 ENCOUNTER — Other Ambulatory Visit: Payer: Self-pay | Admitting: Cardiology

## 2020-04-24 ENCOUNTER — Ambulatory Visit: Payer: Medicare Other | Admitting: Cardiology

## 2020-04-24 NOTE — Progress Notes (Deleted)
Cardiology Office Note   Date:  04/24/2020   ID:  [name], DOB 06-Apr-1926, MRN [id number]  PCP:  [name], MD  Cardiologist:  Kathyrn Drown, NP   No chief complaint on file.   History of Present Illness: [name] is a 86 y.o. female who presents for follow up of weight gain, seen for Dr. Radford Pax.   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  Most recentechocardiogram 04/2016 with improved EF of 50-55% with mild to moderate mitral stenosis>>unable to tolerate BB and ACE-I due to hypotension. She has a hx of syncope related to orthostasis and dehydration and is on Midodrine.  On 04/23/20 the patients daughter called the office stating that she picked the patient up on 06/04 from her nursing facility and she had gained 10 lbs since her visit with Dr. Radford Pax on 05/27 (one week prior). She has had increased swelling in her feet and legs. She has been giving her 40 mg of torsemide for the past three days. Her weight has dropped 3 pounds and swelling has improved since doubling the dosage. She states swelling is still present though and patient is having some slight SOB. She denies chest pain and any other symptoms. She has been elevating her legs.  She states that when patient was hospitalized that the torsemide was reduced from 40mg  to 20mg  due to low BP. [name] daughter states that her blood pressures have been running good recently. This morning BP was 117/76. The daughter will continue to monitor and advised that I would call back with recommendations from Dr. Radford Pax.     1.Permanent atrial fibrillation -HR is well controlled -denies any palpitations -not a  candidate for longterm anticoagulation due to hx of GI bleed -not on BB or CCB due to hx of orthostatic hypotension and HR has been adequately controlled   2. Chronic combined systolic/diastolic CHF -she appears euvolemic on exam today -denies any SOB or LE edema and weight is stable and she has actually lost weight in rehab -continue Demadex 20mg  daily -creatinine stable after review from outside labs with Creatinine 1.55 and K+ 4.  3. Orthostatic hypotension -she has a remote history of syncope in the setting of dehydration.  -she is on Midodrin and has not had any dizziness. -she recently was hospitalized with syncope felt related to orthostatic hypotension exacerbated by anemia and volume depletion -diuretics were decreased and BP improved -continue Midodrine 5mg  TID  4. Nonsustained ventricular tachycardia  -denies any palpitations -EF 50 to 55% on echo 2017.  -2D echo with EF 40-45% by recent echo>>she has not had any anginal sx or CHF sx.  5. Severe mitral regurgitation  -s/p mitral valve repair in 2003.  -echo 03/2020 showed mild MR with no MS  -She is completely asymptomatic.   6. Chronic kidney disease stage III -her creatinine was 1.55 earlier this month  7.  Severe TR -given advanced age would not pursue further workup of this  8.  Anemia -noted to have Hbg in the 8 range on recent admit -received 1 unit PRBCs -will get her back in to see her PCP next weekto followup on this   Past Medical History:  Diagnosis  Date  . Anemia   . Brady-tachy syndrome ([place])    a. 2003: post-op afib after MR repair then symptomatic bradycardia requiring pacemaker. b. Upgrade to Medtronic Bi-V Pacemaker 2007.  . Cellulitis and abscess of foot 08/15/2015   RT FOOT  . Chronic atrial fibrillation ([place])    a. Previously failed DCCV/amio/tikosyn.  CHADS2VASC score 5 - not on anticoagulation due to history of GI bleeding.    . Chronic combined systolic and diastolic  CHF (congestive heart failure) (HCC)    a. EF 35-40% on echo 09/2013 b. echo 04/2016: EF 50-55% with mild to moderate MS.  . Cirrhosis ([place])    a. Newly recognized 09/2013 - by CT.  . CKD (chronic kidney disease)   . GI bleeding   . Hypertension   . Mitral valve disorder    a. Severe MR s/p repair 2003 (28 mm annuloplasty ring and and oversew of LAA). No CAD by cath at that time.  [name] NSVT (nonsustained ventricular tachycardia) ([place])    a. Isolated event during 09/2007 adm.  . NSVT (nonsustained ventricular tachycardia) ([place])   . Orthostatic hypotension dysautonomic syndrome ([place])   . Pulmonary hypertension (HCC)    Group II secondary to CHF and MV disease  . Spinal stenosis    shes recieved epidural steroid injections in the past  . Tricuspid regurgitation     Past Surgical History:  Procedure Laterality Date  . APPENDECTOMY    . BIV PACEMAKER GENERATOR CHANGE OUT N/A 12/13/2014   Procedure: BIV PACEMAKER GENERATOR CHANGE OUT;  Surgeon: Evans Lance, MD;  Location: [facility] CATH LAB;  Service: Cardiovascular;  Laterality: N/A;  . CHOLECYSTECTOMY    . IRRIGATION AND DEBRIDEMENT ABSCESS N/A 10/14/2013   Procedure: IRRIGATION AND DEBRIDEMENT PERINEAL ABSCESS;  Surgeon: Rolm Bookbinder, MD;  Location: [place];  Service: General;  Laterality: N/A;  . VALVE REPLACEMENT     sever mitral regurgitation s/p mitral valve annuloplasty ring     Current Outpatient Medications  Medication Sig Dispense Refill  . acetaminophen (TYLENOL) 500 MG tablet Take 1,000 mg by mouth every 6 (six) hours as needed for mild pain.     [name] colchicine 0.6 MG tablet Take 0.6 mg by mouth as needed (Gout flare).    . Febuxostat 80 MG TABS Take 80 mg by mouth daily.    . ferrous sulfate 325 (65 FE) MG tablet Take 1 tablet (325 mg total) by mouth 2 (two) times daily with a meal. 60 tablet 6  . midodrine (PROAMATINE) 5 MG tablet Take 1 tablet (5 mg total) by mouth 3 (three) times daily with meals.    . Multiple Vitamin  (MULTIVITAMIN WITH MINERALS) TABS tablet Take 1 tablet by mouth daily.    . Multiple Vitamins-Minerals (PRESERVISION AREDS 2 PO) Take 2 tablets by mouth daily.    . potassium chloride SA (KLOR-CON) 20 MEQ tablet Take 1 tablet (20 mEq total) by mouth daily. 180 tablet 2  . torsemide (DEMADEX) 20 MG tablet Take 1 tablet (20 mg total) by mouth daily. 270 tablet 3   No current facility-administered medications for this visit.    Allergies:   Patient has no known allergies.    Social History:  The patient  reports that she quit smoking about 48 years ago. She has never used smokeless tobacco. She reports current alcohol use. She reports that she does not use drugs.   Family History:  [name] ***family history includes Other in her mother; Pancreatic cancer in her  father.    ROS:  Please see the history of present illness.   Otherwise, review of systems are positive for {NONE DEFAULTED:18576::"none"}.   All other systems are reviewed and negative.    PHYSICAL EXAM: VS:  LMP 10/10/2013  , BMI There is no height or weight on file to calculate BMI. GEN: Well nourished, well developed, in no acute distress HEENT: normal Neck: no JVD, carotid bruits, or masses Cardiac: ***RRR; no murmurs, rubs, or gallops,no edema  Respiratory:  clear to auscultation bilaterally, normal work of breathing GI: soft, nontender, nondistended, + BS MS: no deformity or atrophy Skin: warm and dry, no rash Neuro:  Strength and sensation are intact Psych: euthymic mood, full affect   EKG:  EKG {ACTION; IS/IS [id number]} ordered today. The ekg ordered today demonstrates ***   Recent Labs: 04/04/2020: B Natriuretic Peptide 162.5; TSH 2.771 04/05/2020: ALT 11 04/06/2020: BUN 27; Creatinine, Ser 1.55; Hemoglobin 9.3; Platelets 136; Potassium 4.0; Sodium 141    Lipid Panel No results found for: CHOL, TRIG, HDL, CHOLHDL, VLDL, LDLCALC, LDLDIRECT    Wt Readings from Last 3 Encounters:  04/11/20 184 lb (83.5  kg)  04/06/20 192 lb 10.9 oz (87.4 kg)  10/18/19 200 lb (90.7 kg)    Other studies Reviewed: Additional studies/ records that were reviewed today include: ***. Review of the above records demonstrates: ***   IMPRESSIONS  1. Left ventricular ejection fraction, by estimation, is 40%. The left  ventricle has mildly decreased function. The left ventricle demonstrates  global hypokinesis. The left ventricular internal cavity size was mildly  dilated. Left ventricular diastolic  parameters are indeterminate.  2. Right ventricular systolic function is moderately reduced. The right  ventricular size is moderately enlarged. The estimated right ventricular  systolic pressure is 53.6 mmHg.  3. Left atrial size was severely dilated.  4. Right atrial size was severely dilated.  5. The mitral valve has been repaired/replaced. Mild mitral valve  regurgitation. No evidence of mitral stenosis however Doppler alignment of  diastolic gradient is slightly suboptimal. The mean mitral valve gradient  is 2.0 mmHg with average heart rate of  70 bpm.  6. Tricuspid valve regurgitation is severe.  7. The aortic valve is tricuspid. Aortic valve regurgitation is mild. No  aortic stenosis is present.  8. The inferior vena cava is dilated in size with <50% respiratory  variability, suggesting right atrial pressure of 15 mmHg.    ASSESSMENT AND PLAN:  1.  ***   Current medicines are reviewed at length with the patient today.  The patient {ACTIONS; HAS/DOES NOT HAVE:19233} concerns regarding medicines.  The following changes have been made:  {PLAN; NO CHANGE:13088:s}  Labs/ tests ordered today include: *** No orders of the defined types were placed in this encounter.    Disposition:   FU with *** in {gen number [id number]} {Days to years:10300}  Signed, Kathyrn Drown, NP  04/24/2020 7:42 AM    [facility] [address], [place], [place]  [address] Phone: [phone]; Fax: [phone]

## 2020-04-27 ENCOUNTER — Other Ambulatory Visit: Payer: Self-pay | Admitting: Internal Medicine

## 2020-04-27 DIAGNOSIS — R55 Syncope and collapse: Secondary | ICD-10-CM

## 2020-05-02 ENCOUNTER — Other Ambulatory Visit: Payer: Medicare Other

## 2020-05-08 ENCOUNTER — Ambulatory Visit
Admission: RE | Admit: 2020-05-08 | Discharge: 2020-05-08 | Disposition: A | Discharge status: Home / Self Care | Payer: Medicare Other | Source: Ambulatory Visit | Attending: Internal Medicine | Admitting: Internal Medicine

## 2020-05-08 ENCOUNTER — Other Ambulatory Visit: Payer: Self-pay | Admitting: Internal Medicine

## 2020-05-08 DIAGNOSIS — R1319 Other dysphagia: Secondary | ICD-10-CM

## 2020-05-08 DIAGNOSIS — R55 Syncope and collapse: Secondary | ICD-10-CM

## 2020-05-23 ENCOUNTER — Other Ambulatory Visit: Payer: Self-pay

## 2020-05-23 MED ORDER — MIDODRINE HCL 5 MG PO TABS: 5.0000 mg | ORAL_TABLET | Freq: Three times a day (TID) | ORAL | 3 refills | Status: DC

## 2020-05-30 ENCOUNTER — Other Ambulatory Visit: Payer: Self-pay | Admitting: Gastroenterology

## 2020-06-01 ENCOUNTER — Other Ambulatory Visit (HOSPITAL_COMMUNITY)
Admission: RE | Admit: 2020-06-01 | Discharge: 2020-06-01 | Disposition: A | Discharge status: Home / Self Care | Payer: Medicare Other | Source: Ambulatory Visit | Attending: Gastroenterology | Admitting: Gastroenterology

## 2020-06-01 DIAGNOSIS — Z20822 Contact with and (suspected) exposure to covid-19: Secondary | ICD-10-CM | POA: Diagnosis not present

## 2020-06-01 DIAGNOSIS — Z01812 Encounter for preprocedural laboratory examination: Secondary | ICD-10-CM | POA: Insufficient documentation

## 2020-06-02 LAB — SARS CORONAVIRUS 2 (TAT 6-24 HRS): SARS Coronavirus 2: NEGATIVE

## 2020-06-04 NOTE — Anesthesia Preprocedure Evaluation (Addendum)
Anesthesia Evaluation  Patient identified by MRN, date of birth, ID band Patient awake    Reviewed: Allergy & Precautions, H&P , NPO status , Patient's Chart, lab work & pertinent test results  Airway Mallampati: III  TM Distance: >3 FB Neck ROM: Full    Dental no notable dental hx. (+) Edentulous Upper, Edentulous Lower   Pulmonary neg pulmonary ROS, former smoker,    Pulmonary exam normal breath sounds clear to auscultation       Cardiovascular Exercise Tolerance: Good hypertension, Pt. on medications +CHF  negative cardio ROS Normal cardiovascular exam Rhythm:Regular Rate:Normal  ECHO 5/ 21'  Left ventricular ejection fraction, by estimation, is  40%. The left ventricle has mildly decreased function. The left ventricle  demonstrates global hypokinesis. The left ventricular internal cavity size  was mildly dilated. There is no  left ventricular hypertrophy. Left ventricular diastolic parameters are  indeterminate.   Right Ventricle: The right ventricular size is moderately enlarged. No  increase in right ventricular wall thickness. Right ventricular systolic  function is moderately reduced. The tricuspid regurgitant velocity is 2.41  m/s, and with an assumed right  atrial pressure of 15 mmHg, the estimated right ventricular systolic  pressure is 03.0 mmHg.   Left Atrium: Left atrial size was severely dilated.  Right Atrium: Right atrial size was severely dilated.   Mitral Valve: The mitral valve has been repaired/replaced. Mild mitral  valve regurgitation. There is a 28 mm Mitral ring prosthetic annuloplasty  ring present in the mitral position. Procedure Date: 2003. No evidence of  mitral valve stenosis. MV peak  gradient, 8.5 mmHg.    Neuro/Psych negative neurological ROS  negative psych ROS   GI/Hepatic negative GI ROS, Neg liver ROS,   Endo/Other  negative endocrine ROS  Renal/GU Renal diseasenegative Renal  ROS  negative genitourinary   Musculoskeletal negative musculoskeletal ROS (+)   Abdominal   Peds negative pediatric ROS (+)  Hematology negative hematology ROS (+) Blood dyscrasia, anemia ,   Anesthesia Other Findings   Reproductive/Obstetrics negative OB ROS                            Anesthesia Physical Anesthesia Plan  ASA: III  Anesthesia Plan: MAC   Post-op Pain Management:    Induction:   PONV Risk Score and Plan: 2  Airway Management Planned: Nasal Cannula and Simple Face Mask  Additional Equipment:   Intra-op Plan:   Post-operative Plan:   Informed Consent: I have reviewed the patients History and Physical, chart, labs and discussed the procedure including the risks, benefits and alternatives for the proposed anesthesia with the patient or authorized representative who has indicated his/her understanding and acceptance.       Plan Discussed with: Anesthesiologist and CRNA  Anesthesia Plan Comments:        Anesthesia Quick Evaluation

## 2020-06-04 NOTE — Progress Notes (Addendum)
Pre call done for patient's endo procedure tomorrow. Patient's nurse from her assisted living center confirmed she has been quarantined at home since covid test, will be NPO after midnight, will arrive to hospital at 0800 am, and has a driver taking her home post procedure. All questions addressed.

## 2020-06-05 ENCOUNTER — Encounter (HOSPITAL_COMMUNITY): Payer: Self-pay | Admitting: Gastroenterology

## 2020-06-05 ENCOUNTER — Encounter (HOSPITAL_COMMUNITY)
Admission: RE | Disposition: A | Discharge status: Home / Self Care | Payer: Self-pay | Source: Home / Self Care | Attending: Gastroenterology

## 2020-06-05 ENCOUNTER — Other Ambulatory Visit: Payer: Self-pay

## 2020-06-05 ENCOUNTER — Ambulatory Visit (HOSPITAL_COMMUNITY): Payer: Medicare Other | Admitting: Anesthesiology

## 2020-06-05 ENCOUNTER — Ambulatory Visit (HOSPITAL_COMMUNITY)
Admission: RE | Admit: 2020-06-05 | Discharge: 2020-06-05 | Disposition: A | Discharge status: Home / Self Care | Payer: Medicare Other | Attending: Gastroenterology | Admitting: Gastroenterology

## 2020-06-05 DIAGNOSIS — D509 Iron deficiency anemia, unspecified: Secondary | ICD-10-CM | POA: Insufficient documentation

## 2020-06-05 DIAGNOSIS — E669 Obesity, unspecified: Secondary | ICD-10-CM | POA: Diagnosis not present

## 2020-06-05 DIAGNOSIS — I482 Chronic atrial fibrillation, unspecified: Secondary | ICD-10-CM | POA: Insufficient documentation

## 2020-06-05 DIAGNOSIS — Z6834 Body mass index (BMI) 34.0-34.9, adult: Secondary | ICD-10-CM | POA: Insufficient documentation

## 2020-06-05 DIAGNOSIS — K2950 Unspecified chronic gastritis without bleeding: Secondary | ICD-10-CM | POA: Insufficient documentation

## 2020-06-05 DIAGNOSIS — I110 Hypertensive heart disease with heart failure: Secondary | ICD-10-CM | POA: Diagnosis not present

## 2020-06-05 DIAGNOSIS — Z950 Presence of cardiac pacemaker: Secondary | ICD-10-CM | POA: Diagnosis not present

## 2020-06-05 DIAGNOSIS — Z79899 Other long term (current) drug therapy: Secondary | ICD-10-CM | POA: Diagnosis not present

## 2020-06-05 DIAGNOSIS — I509 Heart failure, unspecified: Secondary | ICD-10-CM | POA: Insufficient documentation

## 2020-06-05 DIAGNOSIS — R1310 Dysphagia, unspecified: Secondary | ICD-10-CM | POA: Insufficient documentation

## 2020-06-05 HISTORY — PX: ESOPHAGOGASTRODUODENOSCOPY (EGD) WITH PROPOFOL: SHX5813

## 2020-06-05 HISTORY — PX: BIOPSY: SHX5522

## 2020-06-05 HISTORY — PX: BALLOON DILATION: SHX5330

## 2020-06-05 SURGERY — ESOPHAGOGASTRODUODENOSCOPY (EGD) WITH PROPOFOL
Anesthesia: Monitor Anesthesia Care

## 2020-06-05 MED ORDER — LIDOCAINE 2% (20 MG/ML) 5 ML SYRINGE
INTRAMUSCULAR | Status: DC | PRN
  Administered 2020-06-05: 40 mg via INTRAVENOUS

## 2020-06-05 MED ORDER — PHENYLEPHRINE 40 MCG/ML (10ML) SYRINGE FOR IV PUSH (FOR BLOOD PRESSURE SUPPORT)
PREFILLED_SYRINGE | INTRAVENOUS | Status: DC | PRN
  Administered 2020-06-05: 200 ug via INTRAVENOUS

## 2020-06-05 MED ORDER — PROPOFOL 10 MG/ML IV BOLUS
INTRAVENOUS | Status: DC | PRN
  Administered 2020-06-05 (×5): 20 mg via INTRAVENOUS

## 2020-06-05 MED ORDER — LACTATED RINGERS IV SOLN
INTRAVENOUS | Status: DC
  Administered 2020-06-05: 1000 mL via INTRAVENOUS

## 2020-06-05 MED ORDER — SODIUM CHLORIDE 0.9 % IV SOLN: INTRAVENOUS | Status: DC

## 2020-06-05 SURGICAL SUPPLY — 15 items

## 2020-06-05 NOTE — Op Note (Signed)
[facility] Patient Name: [name] Procedure Date: 06/05/2020 MRN: [id number] Attending MD: Otis Brace , MD Date of Birth: March 11, 1926 CSN: [id number] Age: 86 Admit Type: Outpatient Procedure:                Upper GI endoscopy Indications:              Iron deficiency anemia, Dysphagia, Abnormal UGI                            series Providers:                Otis Brace, MD, Wynonia Sours, RN, Corie Chiquito, Technician, Glenis Smoker, CRNA Referring MD:              Medicines:                Sedation Administered by an Anesthesia Professional Complications:            No immediate complications. Estimated Blood Loss:     Estimated blood loss was minimal. Procedure:                Pre-Anesthesia Assessment:                           - Prior to the procedure, a History and Physical                            was performed, and patient medications and                            allergies were reviewed. [name] tolerance of                            previous anesthesia was also reviewed. The risks                            and benefits of the procedure and the sedation                            options and risks were discussed with the patient.                            All questions were answered, and informed consent                            was obtained. Prior Anticoagulants: The patient has                            taken no previous anticoagulant or antiplatelet                            agents. ASA Grade Assessment: III - A patient with  severe systemic disease. After reviewing the risks                            and benefits, the patient was deemed in                            satisfactory condition to undergo the procedure.                           After obtaining informed consent, the endoscope was                            passed under direct vision. Throughout the                             procedure, the patient's blood pressure, pulse, and                            oxygen saturations were monitored continuously. The                            GIF-H190 ([id number]) was introduced through the                            mouth, and advanced to the second part of duodenum.                            The upper GI endoscopy was accomplished without                            difficulty. The patient tolerated the procedure                            well. Scope In: Scope Out: Findings:      The Z-line was regular and was found 40 cm from the incisors.      Abnormal motility was noted in the esophagus. Tertiary peristaltic waves       are noted.      No endoscopic abnormality was evident in the esophagus to explain the       patient's complaint of dysphagia. It was decided, however, to proceed       with dilation at the gastroesophageal junction. A TTS dilator was passed       through the scope. Dilation with a 15-16.5-18 mm balloon dilator was       performed to 16.5 mm. The dilation site was examined following endoscope       reinsertion and showed no bleeding, mucosal tear or perforation.      Scattered mild inflammation characterized by congestion (edema),       erythema and friability was found in the gastric body and in the gastric       antrum. Biopsies were taken with a cold forceps for histology.      The cardia and gastric fundus were normal on retroflexion.      The duodenal bulb, first portion of the duodenum and second portion of       the  duodenum were normal. Impression:               - Z-line regular, 40 cm from the incisors.                           - Abnormal esophageal motility, consistent with                            presbyesophagus.                           - No endoscopic esophageal abnormality to explain                            patient's dysphagia. Esophagus dilated. Dilated.                           - Gastritis. Biopsied.                            - Normal duodenal bulb, first portion of the                            duodenum and second portion of the duodenum. Moderate Sedation:      Moderate (conscious) sedation was personally administered by an       anesthesia professional. The following parameters were monitored: oxygen       saturation, heart rate, blood pressure, and response to care. Recommendation:           - Patient has a contact number available for                            emergencies. The signs and symptoms of potential                            delayed complications were discussed with the                            patient. Return to normal activities tomorrow.                            Written discharge instructions were provided to the                            patient.                           - Resume previous diet.                           - Continue present medications.                           - Await pathology results.                           - Return to my office (date not yet determined). Procedure  Code(s):        --- Professional ---                           [phone], Esophagogastroduodenoscopy, flexible,                            transoral; with transendoscopic balloon dilation of                            esophagus (less than 30 mm diameter)                           [id number], 59, Esophagogastroduodenoscopy, flexible,                            transoral; with biopsy, single or multiple Diagnosis Code(s):        --- Professional ---                           K22.4, Dyskinesia of esophagus                           R13.10, Dysphagia, unspecified                           K29.70, Gastritis, unspecified, without bleeding                           D50.9, Iron deficiency anemia, unspecified                           R93.3, Abnormal findings on diagnostic imaging of                            other parts of digestive tract CPT copyright 2019 [employer]. All rights  reserved. The codes documented in this report are preliminary and upon coder review may  be revised to meet current compliance requirements. Otis Brace, MD Otis Brace, MD 06/05/2020 9:17:46 AM Number of Addenda: 0

## 2020-06-05 NOTE — Discharge Instructions (Signed)

## 2020-06-05 NOTE — Transfer of Care (Signed)
Immediate Anesthesia Transfer of Care Note  Patient: [name]  Procedure(s) Performed: ESOPHAGOGASTRODUODENOSCOPY (EGD) WITH PROPOFOL (N/A ) BALLOON ENTEROSCOPY  Patient Location: PACU and Endoscopy Unit  Anesthesia Type:MAC  Level of Consciousness: awake and alert   Airway & Oxygen Therapy: Patient Spontanous Breathing and Patient connected to face mask oxygen  Post-op Assessment: Report given to RN and Post -op Vital signs reviewed and stable  Post vital signs: Reviewed and stable  Last Vitals:  Vitals Value Taken Time  BP    Temp    Pulse    Resp    SpO2      Last Pain:  Vitals:   06/05/20 0743  TempSrc: Oral  PainSc: 0-No pain         Complications: No complications documented.

## 2020-06-05 NOTE — Anesthesia Procedure Notes (Signed)
Procedure Name: MAC Date/Time: 06/05/2020 8:47 AM Performed by: Cynda Familia, CRNA Pre-anesthesia Checklist: Patient identified, Emergency Drugs available, Suction available, Patient being monitored and Timeout performed Patient Re-evaluated:Patient Re-evaluated prior to induction Oxygen Delivery Method: Simple face mask Placement Confirmation: positive ETCO2 and breath sounds checked- equal and bilateral Dental Injury: Teeth and Oropharynx as per pre-operative assessment

## 2020-06-05 NOTE — Anesthesia Postprocedure Evaluation (Signed)
Anesthesia Post Note  Patient: [name]  Procedure(s) Performed: ESOPHAGOGASTRODUODENOSCOPY (EGD) WITH PROPOFOL (N/A ) BALLOON DILATION (N/A ) BIOPSY     Patient location during evaluation: PACU Anesthesia Type: MAC Level of consciousness: awake and alert Pain management: pain level controlled Vital Signs Assessment: post-procedure vital signs reviewed and stable Respiratory status: spontaneous breathing, nonlabored ventilation, respiratory function stable and patient connected to nasal cannula oxygen Cardiovascular status: stable and blood pressure returned to baseline Postop Assessment: no apparent nausea or vomiting Anesthetic complications: no   No complications documented.  Last Vitals:  Vitals:   06/05/20 0930 06/05/20 0940  BP: (!) 117/49   Pulse: 70   Resp: 19   Temp:    SpO2: 94% 96%    Last Pain:  Vitals:   06/05/20 0913  TempSrc: Axillary  PainSc: 0-No pain                 Dennison Mcdaid

## 2020-06-05 NOTE — H&P (Signed)
RONNEISHA JETT is a 86 y.o. female was seen in the office for evaluation drop in hemoglobin, chronic anemia, and dysphagia.  The various methods of treatment have been discussed with the patient and family. After consideration of risks, benefits and other options for treatment, the patient has consented to  Procedure(s): Esophagoduodenoscopy with possible dilation  as a surgical intervention .  [name] history has been reviewed, patient examined, no change in status, stable for surgery.  I have reviewed the patient's chart and labs.  Questions were answered to the patient's satisfaction.   Please see scanned consult note for details.  Risks (bleeding, infection, bowel perforation that could require surgery, sedation-related changes in cardiopulmonary systems), benefits (identification and possible treatment of source of symptoms, exclusion of certain causes of symptoms), and alternatives (watchful waiting, radiographic imaging studies, empiric medical treatment)  were explained to patient and family in detail and patient wishes to proceed.  Otis Brace MD, [place] 06/05/2020, 8:40 AM  Contact #  [phone]

## 2020-06-06 ENCOUNTER — Encounter (HOSPITAL_COMMUNITY): Payer: Self-pay | Admitting: Gastroenterology

## 2020-06-06 ENCOUNTER — Other Ambulatory Visit: Payer: Self-pay

## 2020-06-06 LAB — SURGICAL PATHOLOGY

## 2020-06-11 ENCOUNTER — Inpatient Hospital Stay (HOSPITAL_COMMUNITY): Payer: Medicare Other | Admitting: Certified Registered Nurse Anesthetist

## 2020-06-11 ENCOUNTER — Encounter (HOSPITAL_COMMUNITY)
Admission: EM | Disposition: A | Discharge status: Skilled Nursing Facility | Payer: Self-pay | Source: Home / Self Care | Attending: Internal Medicine

## 2020-06-11 ENCOUNTER — Emergency Department (HOSPITAL_COMMUNITY): Payer: Medicare Other

## 2020-06-11 ENCOUNTER — Other Ambulatory Visit: Payer: Self-pay

## 2020-06-11 ENCOUNTER — Inpatient Hospital Stay (HOSPITAL_COMMUNITY)
Admission: EM | Admit: 2020-06-11 | Discharge: 2020-07-14 | DRG: 853 | Disposition: A | Discharge status: Skilled Nursing Facility | Payer: Medicare Other | Attending: Internal Medicine | Admitting: Internal Medicine

## 2020-06-11 ENCOUNTER — Encounter (HOSPITAL_COMMUNITY): Payer: Self-pay | Admitting: *Deleted

## 2020-06-11 ENCOUNTER — Other Ambulatory Visit: Payer: Self-pay | Admitting: Urology

## 2020-06-11 ENCOUNTER — Inpatient Hospital Stay (HOSPITAL_COMMUNITY): Payer: Medicare Other

## 2020-06-11 DIAGNOSIS — K567 Ileus, unspecified: Secondary | ICD-10-CM

## 2020-06-11 DIAGNOSIS — N289 Disorder of kidney and ureter, unspecified: Secondary | ICD-10-CM | POA: Diagnosis not present

## 2020-06-11 DIAGNOSIS — K5730 Diverticulosis of large intestine without perforation or abscess without bleeding: Secondary | ICD-10-CM | POA: Diagnosis present

## 2020-06-11 DIAGNOSIS — D649 Anemia, unspecified: Secondary | ICD-10-CM | POA: Diagnosis present

## 2020-06-11 DIAGNOSIS — G903 Multi-system degeneration of the autonomic nervous system: Secondary | ICD-10-CM | POA: Diagnosis not present

## 2020-06-11 DIAGNOSIS — J690 Pneumonitis due to inhalation of food and vomit: Secondary | ICD-10-CM | POA: Diagnosis not present

## 2020-06-11 DIAGNOSIS — E872 Acidosis: Secondary | ICD-10-CM | POA: Diagnosis not present

## 2020-06-11 DIAGNOSIS — N179 Acute kidney failure, unspecified: Secondary | ICD-10-CM

## 2020-06-11 DIAGNOSIS — D500 Iron deficiency anemia secondary to blood loss (chronic): Secondary | ICD-10-CM | POA: Diagnosis present

## 2020-06-11 DIAGNOSIS — Z515 Encounter for palliative care: Secondary | ICD-10-CM | POA: Diagnosis not present

## 2020-06-11 DIAGNOSIS — E46 Unspecified protein-calorie malnutrition: Secondary | ICD-10-CM | POA: Diagnosis present

## 2020-06-11 DIAGNOSIS — N1330 Unspecified hydronephrosis: Secondary | ICD-10-CM | POA: Diagnosis present

## 2020-06-11 DIAGNOSIS — N136 Pyonephrosis: Secondary | ICD-10-CM | POA: Diagnosis present

## 2020-06-11 DIAGNOSIS — R112 Nausea with vomiting, unspecified: Secondary | ICD-10-CM | POA: Diagnosis not present

## 2020-06-11 DIAGNOSIS — I472 Ventricular tachycardia: Secondary | ICD-10-CM | POA: Diagnosis not present

## 2020-06-11 DIAGNOSIS — D62 Acute posthemorrhagic anemia: Secondary | ICD-10-CM | POA: Diagnosis present

## 2020-06-11 DIAGNOSIS — Z950 Presence of cardiac pacemaker: Secondary | ICD-10-CM | POA: Diagnosis not present

## 2020-06-11 DIAGNOSIS — K228 Other specified diseases of esophagus: Secondary | ICD-10-CM | POA: Diagnosis present

## 2020-06-11 DIAGNOSIS — K224 Dyskinesia of esophagus: Secondary | ICD-10-CM | POA: Diagnosis present

## 2020-06-11 DIAGNOSIS — Z9889 Other specified postprocedural states: Secondary | ICD-10-CM

## 2020-06-11 DIAGNOSIS — K2951 Unspecified chronic gastritis with bleeding: Secondary | ICD-10-CM | POA: Diagnosis present

## 2020-06-11 DIAGNOSIS — I130 Hypertensive heart and chronic kidney disease with heart failure and stage 1 through stage 4 chronic kidney disease, or unspecified chronic kidney disease: Secondary | ICD-10-CM | POA: Diagnosis present

## 2020-06-11 DIAGNOSIS — E871 Hypo-osmolality and hyponatremia: Secondary | ICD-10-CM | POA: Diagnosis not present

## 2020-06-11 DIAGNOSIS — I5043 Acute on chronic combined systolic (congestive) and diastolic (congestive) heart failure: Secondary | ICD-10-CM | POA: Diagnosis present

## 2020-06-11 DIAGNOSIS — E870 Hyperosmolality and hypernatremia: Secondary | ICD-10-CM | POA: Diagnosis not present

## 2020-06-11 DIAGNOSIS — D509 Iron deficiency anemia, unspecified: Secondary | ICD-10-CM | POA: Diagnosis not present

## 2020-06-11 DIAGNOSIS — M109 Gout, unspecified: Secondary | ICD-10-CM | POA: Diagnosis present

## 2020-06-11 DIAGNOSIS — N139 Obstructive and reflux uropathy, unspecified: Secondary | ICD-10-CM

## 2020-06-11 DIAGNOSIS — R1110 Vomiting, unspecified: Secondary | ICD-10-CM

## 2020-06-11 DIAGNOSIS — R410 Disorientation, unspecified: Secondary | ICD-10-CM

## 2020-06-11 DIAGNOSIS — I5033 Acute on chronic diastolic (congestive) heart failure: Secondary | ICD-10-CM | POA: Diagnosis not present

## 2020-06-11 DIAGNOSIS — N1832 Chronic kidney disease, stage 3b: Secondary | ICD-10-CM | POA: Diagnosis present

## 2020-06-11 DIAGNOSIS — I5021 Acute systolic (congestive) heart failure: Secondary | ICD-10-CM | POA: Diagnosis not present

## 2020-06-11 DIAGNOSIS — K922 Gastrointestinal hemorrhage, unspecified: Secondary | ICD-10-CM | POA: Diagnosis not present

## 2020-06-11 DIAGNOSIS — N201 Calculus of ureter: Secondary | ICD-10-CM | POA: Diagnosis not present

## 2020-06-11 DIAGNOSIS — C180 Malignant neoplasm of cecum: Secondary | ICD-10-CM | POA: Diagnosis present

## 2020-06-11 DIAGNOSIS — R0602 Shortness of breath: Secondary | ICD-10-CM

## 2020-06-11 DIAGNOSIS — N281 Cyst of kidney, acquired: Secondary | ICD-10-CM | POA: Diagnosis present

## 2020-06-11 DIAGNOSIS — I082 Rheumatic disorders of both aortic and tricuspid valves: Secondary | ICD-10-CM | POA: Diagnosis present

## 2020-06-11 DIAGNOSIS — I2722 Pulmonary hypertension due to left heart disease: Secondary | ICD-10-CM | POA: Diagnosis present

## 2020-06-11 DIAGNOSIS — I951 Orthostatic hypotension: Secondary | ICD-10-CM | POA: Diagnosis present

## 2020-06-11 DIAGNOSIS — Z452 Encounter for adjustment and management of vascular access device: Secondary | ICD-10-CM

## 2020-06-11 DIAGNOSIS — I4821 Permanent atrial fibrillation: Secondary | ICD-10-CM | POA: Diagnosis present

## 2020-06-11 DIAGNOSIS — N189 Chronic kidney disease, unspecified: Secondary | ICD-10-CM

## 2020-06-11 DIAGNOSIS — E876 Hypokalemia: Secondary | ICD-10-CM | POA: Diagnosis not present

## 2020-06-11 DIAGNOSIS — N390 Urinary tract infection, site not specified: Secondary | ICD-10-CM

## 2020-06-11 DIAGNOSIS — K9189 Other postprocedural complications and disorders of digestive system: Secondary | ICD-10-CM | POA: Diagnosis not present

## 2020-06-11 DIAGNOSIS — N184 Chronic kidney disease, stage 4 (severe): Secondary | ICD-10-CM | POA: Diagnosis present

## 2020-06-11 DIAGNOSIS — I5032 Chronic diastolic (congestive) heart failure: Secondary | ICD-10-CM | POA: Diagnosis present

## 2020-06-11 DIAGNOSIS — A419 Sepsis, unspecified organism: Secondary | ICD-10-CM | POA: Diagnosis present

## 2020-06-11 DIAGNOSIS — E559 Vitamin D deficiency, unspecified: Secondary | ICD-10-CM | POA: Diagnosis present

## 2020-06-11 DIAGNOSIS — R0902 Hypoxemia: Secondary | ICD-10-CM | POA: Diagnosis not present

## 2020-06-11 DIAGNOSIS — Z952 Presence of prosthetic heart valve: Secondary | ICD-10-CM | POA: Diagnosis not present

## 2020-06-11 DIAGNOSIS — H9190 Unspecified hearing loss, unspecified ear: Secondary | ICD-10-CM | POA: Diagnosis present

## 2020-06-11 DIAGNOSIS — Z7189 Other specified counseling: Secondary | ICD-10-CM

## 2020-06-11 DIAGNOSIS — Z6838 Body mass index (BMI) 38.0-38.9, adult: Secondary | ICD-10-CM

## 2020-06-11 DIAGNOSIS — K2971 Gastritis, unspecified, with bleeding: Secondary | ICD-10-CM | POA: Diagnosis not present

## 2020-06-11 DIAGNOSIS — Z66 Do not resuscitate: Secondary | ICD-10-CM | POA: Diagnosis present

## 2020-06-11 DIAGNOSIS — Z800 Family history of malignant neoplasm of digestive organs: Secondary | ICD-10-CM

## 2020-06-11 DIAGNOSIS — I495 Sick sinus syndrome: Secondary | ICD-10-CM | POA: Diagnosis present

## 2020-06-11 DIAGNOSIS — K7460 Unspecified cirrhosis of liver: Secondary | ICD-10-CM | POA: Diagnosis present

## 2020-06-11 DIAGNOSIS — T502X5A Adverse effect of carbonic-anhydrase inhibitors, benzothiadiazides and other diuretics, initial encounter: Secondary | ICD-10-CM | POA: Diagnosis not present

## 2020-06-11 DIAGNOSIS — I959 Hypotension, unspecified: Secondary | ICD-10-CM | POA: Diagnosis not present

## 2020-06-11 DIAGNOSIS — Z20822 Contact with and (suspected) exposure to covid-19: Secondary | ICD-10-CM | POA: Diagnosis present

## 2020-06-11 DIAGNOSIS — D630 Anemia in neoplastic disease: Secondary | ICD-10-CM | POA: Diagnosis present

## 2020-06-11 DIAGNOSIS — N170 Acute kidney failure with tubular necrosis: Secondary | ICD-10-CM | POA: Diagnosis present

## 2020-06-11 DIAGNOSIS — R062 Wheezing: Secondary | ICD-10-CM

## 2020-06-11 DIAGNOSIS — N1831 Chronic kidney disease, stage 3a: Secondary | ICD-10-CM | POA: Diagnosis not present

## 2020-06-11 DIAGNOSIS — Z87891 Personal history of nicotine dependence: Secondary | ICD-10-CM

## 2020-06-11 DIAGNOSIS — R309 Painful micturition, unspecified: Secondary | ICD-10-CM | POA: Diagnosis present

## 2020-06-11 DIAGNOSIS — E669 Obesity, unspecified: Secondary | ICD-10-CM | POA: Diagnosis present

## 2020-06-11 DIAGNOSIS — D631 Anemia in chronic kidney disease: Secondary | ICD-10-CM | POA: Diagnosis present

## 2020-06-11 HISTORY — PX: CYSTOSCOPY WITH STENT PLACEMENT: SHX5790

## 2020-06-11 LAB — COMPREHENSIVE METABOLIC PANEL
ALT: 17 U/L (ref 0–44)
AST: 22 U/L (ref 15–41)
Albumin: 2.4 g/dL — ABNORMAL LOW (ref 3.5–5.0)
Alkaline Phosphatase: 87 U/L (ref 38–126)
Anion gap: 11 (ref 5–15)
BUN: 63 mg/dL — ABNORMAL HIGH (ref 8–23)
CO2: 18 mmol/L — ABNORMAL LOW (ref 22–32)
Calcium: 7.6 mg/dL — ABNORMAL LOW (ref 8.9–10.3)
Chloride: 115 mmol/L — ABNORMAL HIGH (ref 98–111)
Creatinine, Ser: 3.16 mg/dL — ABNORMAL HIGH (ref 0.44–1.00)
GFR calc Af Amer: 14 mL/min — ABNORMAL LOW (ref >60)
GFR calc non Af Amer: 12 mL/min — ABNORMAL LOW (ref >60)
Glucose, Bld: 118 mg/dL — ABNORMAL HIGH (ref 70–99)
Potassium: 3.5 mmol/L (ref 3.5–5.1)
Sodium: 144 mmol/L (ref 135–145)
Total Bilirubin: 0.6 mg/dL (ref 0.3–1.2)
Total Protein: 5.7 g/dL — ABNORMAL LOW (ref 6.5–8.1)

## 2020-06-11 LAB — URINALYSIS, ROUTINE W REFLEX MICROSCOPIC
Bilirubin Urine: NEGATIVE
Glucose, UA: NEGATIVE mg/dL
Ketones, ur: NEGATIVE mg/dL
Nitrite: NEGATIVE
Protein, ur: 30 mg/dL — AB
Specific Gravity, Urine: 1.01 (ref 1.005–1.030)
WBC, UA: >50 WBC/hpf — ABNORMAL HIGH (ref 0–5)
pH: 5 (ref 5.0–8.0)

## 2020-06-11 LAB — CBC WITH DIFFERENTIAL/PLATELET
Abs Immature Granulocytes: 0.08 10*3/uL — ABNORMAL HIGH (ref 0.00–0.07)
Basophils Absolute: 0 10*3/uL (ref 0.0–0.1)
Basophils Relative: 0 %
Eosinophils Absolute: 0.2 10*3/uL (ref 0.0–0.5)
Eosinophils Relative: 2 %
HCT: 27.9 % — ABNORMAL LOW (ref 36.0–46.0)
Hemoglobin: 7.9 g/dL — ABNORMAL LOW (ref 12.0–15.0)
Immature Granulocytes: 1 %
Lymphocytes Relative: 17 %
Lymphs Abs: 1.8 10*3/uL (ref 0.7–4.0)
MCH: 24.5 pg — ABNORMAL LOW (ref 26.0–34.0)
MCHC: 28.3 g/dL — ABNORMAL LOW (ref 30.0–36.0)
MCV: 86.6 fL (ref 80.0–100.0)
Monocytes Absolute: 1.3 10*3/uL — ABNORMAL HIGH (ref 0.1–1.0)
Monocytes Relative: 12 %
Neutro Abs: 7.3 10*3/uL (ref 1.7–7.7)
Neutrophils Relative %: 68 %
Platelets: 189 10*3/uL (ref 150–400)
RBC: 3.22 MIL/uL — ABNORMAL LOW (ref 3.87–5.11)
RDW: 17.5 % — ABNORMAL HIGH (ref 11.5–15.5)
WBC: 10.7 10*3/uL — ABNORMAL HIGH (ref 4.0–10.5)
nRBC: 0 % (ref 0.0–0.2)

## 2020-06-11 LAB — PREPARE RBC (CROSSMATCH)

## 2020-06-11 LAB — HEMOGLOBIN AND HEMATOCRIT, BLOOD
HCT: 28 % — ABNORMAL LOW (ref 36.0–46.0)
HCT: 27.3 % — ABNORMAL LOW (ref 36.0–46.0)
Hemoglobin: 8 g/dL — ABNORMAL LOW (ref 12.0–15.0)
Hemoglobin: 7.9 g/dL — ABNORMAL LOW (ref 12.0–15.0)

## 2020-06-11 LAB — BRAIN NATRIURETIC PEPTIDE: B Natriuretic Peptide: 421.6 pg/mL — ABNORMAL HIGH (ref 0.0–100.0)

## 2020-06-11 LAB — SARS CORONAVIRUS 2 BY RT PCR (HOSPITAL ORDER, PERFORMED IN ~~LOC~~ HOSPITAL LAB): SARS Coronavirus 2: NEGATIVE

## 2020-06-11 LAB — AMMONIA: Ammonia: 43 umol/L — ABNORMAL HIGH (ref 9–35)

## 2020-06-11 LAB — POC OCCULT BLOOD, ED: Fecal Occult Bld: POSITIVE — AB

## 2020-06-11 LAB — LIPASE, BLOOD: Lipase: 27 U/L (ref 11–51)

## 2020-06-11 SURGERY — CYSTOSCOPY, WITH STENT INSERTION
Anesthesia: Monitor Anesthesia Care | Laterality: Left

## 2020-06-11 MED ORDER — LACTATED RINGERS IV SOLN: INTRAVENOUS | Status: DC

## 2020-06-11 MED ORDER — OXYCODONE HCL 5 MG PO TABS
2.5000 mg | ORAL_TABLET | ORAL | Status: DC | PRN
  Administered 2020-06-12 – 2020-06-13 (×3): 2.5 mg via ORAL
  Filled 2020-06-11 (×3): qty 1

## 2020-06-11 MED ORDER — SODIUM CHLORIDE 0.9 % IV SOLN
8.0000 mg/h | INTRAVENOUS | Status: DC
  Administered 2020-06-11: 8 mg/h via INTRAVENOUS
  Filled 2020-06-11: qty 80

## 2020-06-11 MED ORDER — PHENYLEPHRINE 40 MCG/ML (10ML) SYRINGE FOR IV PUSH (FOR BLOOD PRESSURE SUPPORT)
PREFILLED_SYRINGE | INTRAVENOUS | Status: DC | PRN
  Administered 2020-06-11: 80 ug via INTRAVENOUS

## 2020-06-11 MED ORDER — CHLORHEXIDINE GLUCONATE CLOTH 2 % EX PADS
6.0000 | MEDICATED_PAD | Freq: Every day | CUTANEOUS | Status: DC
  Administered 2020-06-11 – 2020-06-15 (×4): 6 via TOPICAL

## 2020-06-11 MED ORDER — HYDROMORPHONE HCL 1 MG/ML IJ SOLN
0.5000 mg | INTRAMUSCULAR | Status: DC | PRN
  Administered 2020-06-11 – 2020-06-22 (×3): 0.5 mg via INTRAVENOUS
  Filled 2020-06-11: qty 0.5
  Filled 2020-06-11: qty 1
  Filled 2020-06-11: qty 0.5

## 2020-06-11 MED ORDER — SODIUM CHLORIDE 0.9 % IR SOLN
Status: DC | PRN
  Administered 2020-06-11: 3000 mL

## 2020-06-11 MED ORDER — SODIUM CHLORIDE 0.9 % IV SOLN
1.0000 g | Freq: Once | INTRAVENOUS | Status: AC
  Administered 2020-06-11: 1 g via INTRAVENOUS
  Filled 2020-06-11: qty 10

## 2020-06-11 MED ORDER — IOHEXOL 9 MG/ML PO SOLN
ORAL | Status: AC
  Administered 2020-06-11: 1000 mL via ORAL
  Filled 2020-06-11: qty 1000

## 2020-06-11 MED ORDER — ACETAMINOPHEN 500 MG PO TABS
1000.0000 mg | ORAL_TABLET | Freq: Once | ORAL | Status: AC
  Administered 2020-06-11: 1000 mg via ORAL
  Filled 2020-06-11: qty 2

## 2020-06-11 MED ORDER — PANTOPRAZOLE SODIUM 40 MG IV SOLR
40.0000 mg | Freq: Two times a day (BID) | INTRAVENOUS | Status: DC
  Administered 2020-06-14 – 2020-06-19 (×10): 40 mg via INTRAVENOUS
  Filled 2020-06-11 (×10): qty 40

## 2020-06-11 MED ORDER — CHLORHEXIDINE GLUCONATE 0.12 % MT SOLN
15.0000 mL | Freq: Once | OROMUCOSAL | Status: AC
  Administered 2020-06-11: 15 mL via OROMUCOSAL

## 2020-06-11 MED ORDER — SODIUM CHLORIDE 0.9% FLUSH
3.0000 mL | Freq: Two times a day (BID) | INTRAVENOUS | Status: DC
  Administered 2020-06-12 – 2020-07-13 (×30): 3 mL via INTRAVENOUS

## 2020-06-11 MED ORDER — FERROUS SULFATE 325 (65 FE) MG PO TABS
325.0000 mg | ORAL_TABLET | Freq: Every day | ORAL | Status: DC
  Administered 2020-06-12 – 2020-06-15 (×4): 325 mg via ORAL
  Filled 2020-06-11 (×4): qty 1

## 2020-06-11 MED ORDER — SODIUM CHLORIDE 0.9% IV SOLUTION: Freq: Once | INTRAVENOUS | Status: AC

## 2020-06-11 MED ORDER — PROMETHAZINE HCL 25 MG/ML IJ SOLN: 6.2500 mg | INTRAMUSCULAR | Status: DC | PRN

## 2020-06-11 MED ORDER — FENTANYL CITRATE (PF) 100 MCG/2ML IJ SOLN: 25.0000 ug | INTRAMUSCULAR | Status: DC | PRN

## 2020-06-11 MED ORDER — METOPROLOL TARTRATE 5 MG/5ML IV SOLN: 5.0000 mg | Freq: Four times a day (QID) | INTRAVENOUS | Status: DC | PRN

## 2020-06-11 MED ORDER — ACETAMINOPHEN 325 MG PO TABS
650.0000 mg | ORAL_TABLET | Freq: Four times a day (QID) | ORAL | Status: DC | PRN
  Administered 2020-06-13 – 2020-06-22 (×2): 650 mg via ORAL
  Filled 2020-06-11 (×2): qty 2

## 2020-06-11 MED ORDER — FUROSEMIDE 10 MG/ML IJ SOLN
20.0000 mg | Freq: Once | INTRAMUSCULAR | Status: AC
  Administered 2020-06-11: 20 mg via INTRAVENOUS
  Filled 2020-06-11: qty 4

## 2020-06-11 MED ORDER — LACTATED RINGERS IV SOLN: INTRAVENOUS | Status: AC

## 2020-06-11 MED ORDER — ACETAMINOPHEN 650 MG RE SUPP: 650.0000 mg | Freq: Four times a day (QID) | RECTAL | Status: DC | PRN

## 2020-06-11 MED ORDER — IOHEXOL 300 MG/ML  SOLN
INTRAMUSCULAR | Status: DC | PRN
  Administered 2020-06-11: 15 mL

## 2020-06-11 MED ORDER — SODIUM CHLORIDE 0.9 % IV SOLN
1.0000 g | INTRAVENOUS | Status: DC
  Administered 2020-06-12 – 2020-06-13 (×2): 1 g via INTRAVENOUS
  Filled 2020-06-11: qty 10
  Filled 2020-06-11 (×2): qty 1

## 2020-06-11 MED ORDER — PROPOFOL 500 MG/50ML IV EMUL
INTRAVENOUS | Status: DC | PRN
  Administered 2020-06-11: 25 ug/kg/min via INTRAVENOUS

## 2020-06-11 MED ORDER — SODIUM CHLORIDE 0.9 % IV SOLN
80.0000 mg | Freq: Once | INTRAVENOUS | Status: AC
  Administered 2020-06-11: 80 mg via INTRAVENOUS
  Filled 2020-06-11: qty 80

## 2020-06-11 MED ORDER — MIDODRINE HCL 5 MG PO TABS
5.0000 mg | ORAL_TABLET | Freq: Three times a day (TID) | ORAL | Status: DC
  Administered 2020-06-11 – 2020-07-12 (×86): 5 mg via ORAL
  Filled 2020-06-11 (×86): qty 1

## 2020-06-11 MED ORDER — ONDANSETRON HCL 4 MG/2ML IJ SOLN
INTRAMUSCULAR | Status: DC | PRN
  Administered 2020-06-11: 4 mg via INTRAVENOUS

## 2020-06-11 MED ORDER — ACETAMINOPHEN 500 MG PO TABS
1000.0000 mg | ORAL_TABLET | Freq: Four times a day (QID) | ORAL | Status: DC | PRN
  Filled 2020-06-11: qty 2

## 2020-06-11 SURGICAL SUPPLY — 13 items
BAG URO CATCHER STRL LF (MISCELLANEOUS) ×3 IMPLANT
CATH URET 5FR 28IN OPEN ENDED (CATHETERS) IMPLANT
CLOTH BEACON ORANGE TIMEOUT ST (SAFETY) ×3 IMPLANT
GLOVE SURG SS PI 8.0 STRL IVOR (GLOVE) IMPLANT
GOWN STRL REUS W/TWL XL LVL3 (GOWN DISPOSABLE) ×3 IMPLANT
GUIDEWIRE STR DUAL SENSOR (WIRE) ×3 IMPLANT
KIT TURNOVER KIT A (KITS) IMPLANT
MANIFOLD NEPTUNE II (INSTRUMENTS) ×3 IMPLANT
PACK CYSTO (CUSTOM PROCEDURE TRAY) ×3 IMPLANT
STENT URET 6FRX24 CONTOUR (STENTS) ×3 IMPLANT
TUBING CONNECTING 10 (TUBING) ×2 IMPLANT
TUBING CONNECTING 10' (TUBING) ×1
TUBING UROLOGY SET (TUBING) IMPLANT

## 2020-06-11 NOTE — Anesthesia Postprocedure Evaluation (Signed)
Anesthesia Post Note  Patient: [name]  Procedure(s) Performed: CYSTOSCOPY WITH STENT PLACEMENT (Left )     Patient location during evaluation: PACU Anesthesia Type: MAC Level of consciousness: awake and alert Pain management: pain level controlled Vital Signs Assessment: post-procedure vital signs reviewed and stable Respiratory status: spontaneous breathing, nonlabored ventilation, respiratory function stable and patient connected to nasal cannula oxygen Cardiovascular status: stable and blood pressure returned to baseline Postop Assessment: no apparent nausea or vomiting Anesthetic complications: no   No complications documented.  Last Vitals:  Vitals:   06/11/20 1945 06/11/20 2010  BP: (!) 119/51 (!) 121/55  Pulse: 71 70  Resp: (!) 24 22  Temp:  (!) 36.3 C  SpO2: 100% 100%    Last Pain:  Vitals:   06/11/20 2010  TempSrc: Oral  PainSc:                  [name]

## 2020-06-11 NOTE — ED Triage Notes (Signed)
Pt arrives via [facility], she is coming from [facility] per report, having  UTI symptoms x2 day, painful urination, odorous, dark urine, abdominal pain (RLQ, RUQ radiates to the left side) 123/65, 77hr, 96% RA, 99.6, cbg 141. 1000mg  Tylenol given en route.

## 2020-06-11 NOTE — ED Provider Notes (Signed)
[facility] DEPT Provider Note   CSN: [id number] Arrival date & time: 06/11/20  0050   Time seen 12:55 AM  History No chief complaint on file.   [name] is a 86 y.o. female.  HPI Patient presents from her nursing facility via EMS.  They report she has been complaining of painful urination and abdominal pain for the past couple days.  They noted she had a temperature of 99.6 and gave her 1000 mg of Tylenol for her fever and pain.  They report her CBG was 144, blood pressure 123/65, heart rate 77, respiratory rate 24.  And pulse ox was 96% on room air.  Patient keeps repeating "I have a UTI".  She states she has been having it for a few days and also some abdominal pain for a few days.  EMS reported she mainly had right-sided abdominal pain.  Patient cannot tell me where it hurts the most.  She appears to be short of breath and I had to ask her if she felt short of breath.  It took her a long time to answer.  She states she has had a mild cough.  She denies nausea, vomiting, and states she has had mild diarrhea.  She denies chest pain.  Patient appears to have some mild confusion to me however I do not see anything documented in her chart about dementia or confusion.  She does have a history of cirrhosis and congestive heart failure and has had GI bleeding requiring transfusions for anemia.  She has chronic atrial fibrillation but cannot be on anticoagulation due to her GI bleeding.  She just had endoscopy done on July 20 and a balloon dilatation done.  She is status post appendectomy and cholecystectomy.  PCP [name], MD     Past Medical History:  Diagnosis Date   Anemia    Brady-tachy syndrome ([place])    a. 2003: post-op afib after MR repair then symptomatic bradycardia requiring pacemaker. b. Upgrade to Med[employer]onic Bi-V Pacemaker 2007.   Cellulitis and abscess of foot 08/15/2015   RT FOOT   Chronic atrial fibrillation ([place])    a. Previously  failed DCCV/amio/tikosyn.  CHADS2VASC score 5 - not on anticoagulation due to history of GI bleeding.     Chronic combined systolic and diastolic CHF (congestive heart failure) (HCC)    a. EF 35-40% on echo 09/2013 b. echo 04/2016: EF 50-55% with mild to moderate MS.   Cirrhosis ([place])    a. Newly recognized 09/2013 - by CT.   CKD (chronic kidney disease)    GI bleeding    Hypertension    Mitral valve disorder    a. Severe MR s/p repair 2003 (28 mm annuloplasty ring and and oversew of LAA). No CAD by cath at that time.   NSVT (nonsustained ventricular tachycardia) ([place])    a. Isolated event during 09/2007 adm.   NSVT (nonsustained ventricular tachycardia) (HCC)    Orthostatic hypotension dysautonomic syndrome (HCC)    Pulmonary hypertension (HCC)    Group II secondary to CHF and MV disease   Spinal stenosis    shes recieved epidural steroid injections in the past   Tricuspid regurgitation     Patient Active Problem List   Diagnosis Date Noted   Syncope and collapse 04/04/2020   Influenza A    Bronchiectasis with acute exacerbation ([place])    Respiratory distress 10/06/2017   Orthostatic hypotension dysautonomic syndrome ([place])    Acute renal failure superimposed on stage  3 chronic kidney disease ([place]) 05/21/2016   Cellulitis of leg, right 08/18/2015   NSVT (nonsustained ventricular tachycardia) (HCC)    Gout flare 08/16/2015   Abnormal thyroid function test 08/15/2015   Right ankle pain 08/15/2015   History of gout 08/15/2015   Ankle pain    S/P MVR (mitral valve repair) 01/05/2014   Acute posthemorrhagic anemia 12/29/2013   Encounter for therapeutic drug monitoring 12/12/2013   UTI (lower urinary tract infection) 11/27/2013   Gout 11/07/2013   Orthostatic hypotension 10/31/2013   Chronic diastolic CHF (congestive heart failure) ([place])    Hypertension    Mitral valve disorder    Permanent atrial fibrillation ([place]) 10/28/2013   Biventricular  cardiac pacemaker in situ 10/14/2013   Perirectal abscess 10/13/2013   Chronic anticoagulation 10/10/2013   Liver cirrhosis ([place]) 10/10/2013   Thrombocytopenia ([place]) 10/10/2013   Nausea and vomiting 10/10/2013   CKD (chronic kidney disease) stage 3, GFR 30-59 ml/min 10/10/2013   Anemia 10/10/2013    Past Surgical History:  Procedure Laterality Date   APPENDECTOMY     BALLOON DILATION N/A 06/05/2020   Procedure: BALLOON DILATION;  Surgeon: Otis Brace, MD;  Location: [facility];  Service: Gastroenterology;  Laterality: N/A;   BIOPSY  06/05/2020   Procedure: BIOPSY;  Surgeon: Otis Brace, MD;  Location: [facility];  Service: Gastroenterology;;   BIV PACEMAKER GENERATOR CHANGE OUT N/A 12/13/2014   Procedure: BIV PACEMAKER GENERATOR CHANGE OUT;  Surgeon: Evans Lance, MD;  Location: [facility] CATH LAB;  Service: Cardiovascular;  Laterality: N/A;   CHOLECYSTECTOMY     ESOPHAGOGASTRODUODENOSCOPY (EGD) WITH PROPOFOL N/A 06/05/2020   Procedure: ESOPHAGOGASTRODUODENOSCOPY (EGD) WITH PROPOFOL;  Surgeon: Otis Brace, MD;  Location: [facility];  Service: Gastroenterology;  Laterality: N/A;   IRRIGATION AND DEBRIDEMENT ABSCESS N/A 10/14/2013   Procedure: IRRIGATION AND DEBRIDEMENT PERINEAL ABSCESS;  Surgeon: Rolm Bookbinder, MD;  Location: [facility];  Service: General;  Laterality: N/A;   VALVE REPLACEMENT     sever mitral regurgitation s/p mitral valve annuloplasty ring     OB History   No obstetric history on file.     Family History  Problem Relation Age of Onset   Pancreatic cancer Father    Other Mother        Mother died at 81 with no real medical problems    Social History   Tobacco Use   Smoking status: Former Smoker    Quit date: 10/11/1971    Years since quitting: 48.7   Smokeless tobacco: Never Used  Substance Use Topics   Alcohol use: Yes    Comment: rare   Drug use: No  lives in a facility  Home Medications Prior to Admission  medications   Medication Sig Start Date End Date Taking? Authorizing Provider  acetaminophen (TYLENOL) 500 MG tablet Take 1,000 mg by mouth every 6 (six) hours as needed for mild pain.     [provider]  colchicine 0.6 MG tablet Take 0.6 mg by mouth as needed (Gout flare).    [provider]  Febuxostat 80 MG TABS Take 80 mg by mouth daily.    [provider]  ferrous sulfate 325 (65 FE) MG tablet Take 1 tablet (325 mg total) by mouth 2 (two) times daily with a meal. 05/22/16   Strader, [place] M, PA-C  midodrine (PROAMATINE) 5 MG tablet Take 1 tablet (5 mg total) by mouth 3 (three) times daily with meals. 05/23/20   Sueanne Margarita, MD  Multiple Vitamin (MULTIVITAMIN WITH  MINERALS) TABS tablet Take 1 tablet by mouth daily.    [provider]  Multiple Vitamins-Minerals (PRESERVISION AREDS 2 PO) Take 2 tablets by mouth daily.    [provider]  potassium chloride SA (KLOR-CON) 20 MEQ tablet Take 1 tablet (20 mEq total) by mouth daily. 04/07/20   Geradine Girt, DO  torsemide (DEMADEX) 20 MG tablet Take 1 tablet (20 mg total) by mouth daily. 04/26/20   Sueanne Margarita, MD    Allergies    Patient has no known allergies.  Review of Systems   Review of Systems  Unable to perform ROS: Mental status change    Physical Exam Updated Vital Signs BP (!) 98/60    Pulse 70    Temp 97.8 F (36.6 C) (Oral)    Resp 19    Ht 5\' 2"  (1.575 m)    Wt 88 kg    LMP 10/10/2013    SpO2 98%    BMI 35.48 kg/m   Physical Exam Vitals and nursing note reviewed.  Constitutional:      General: She is in acute distress.     Appearance: She is obese.  HENT:     Head: Normocephalic and atraumatic.     Right Ear: External ear normal.     Left Ear: External ear normal.  Eyes:     Extraocular Movements: Extraocular movements intact.     Conjunctiva/sclera: Conjunctivae normal.     Pupils: Pupils are equal, round, and reactive to light.  Cardiovascular:     Rate and  Rhythm: Normal rate and regular rhythm.  Pulmonary:     Effort: Tachypnea, accessory muscle usage and respiratory distress present.     Breath sounds: Decreased air movement present.  Abdominal:     Palpations: Abdomen is soft.     Tenderness: There is generalized abdominal tenderness.  Musculoskeletal:     Cervical back: Normal range of motion.     Right lower leg: Edema present.     Left lower leg: Edema present.  Skin:    General: Skin is warm and dry.  Neurological:     General: No focal deficit present.     Mental Status: She is alert.     Cranial Nerves: No cranial nerve deficit.  Psychiatric:        Attention and Perception: Attention normal.        Mood and Affect: Mood normal.        Speech: Speech normal.        Behavior: Behavior normal.     ED Results / Procedures / Treatments   Labs (all labs ordered are listed, but only abnormal results are displayed)  Results for orders placed or performed during the hospital encounter of 06/11/20  Urinalysis, Routine w reflex microscopic  Result Value Ref Range   Color, Urine YELLOW YELLOW   APPearance CLEAR CLEAR   Specific Gravity, Urine 1.010 1.005 - 1.030   pH 5.0 5.0 - 8.0   Glucose, UA NEGATIVE NEGATIVE mg/dL   Hgb urine dipstick MODERATE (A) NEGATIVE   Bilirubin Urine NEGATIVE NEGATIVE   Ketones, ur NEGATIVE NEGATIVE mg/dL   Protein, ur 30 (A) NEGATIVE mg/dL   Nitrite NEGATIVE NEGATIVE   Leukocytes,Ua MODERATE (A) NEGATIVE   RBC / HPF 0-5 0 - 5 RBC/hpf   WBC, UA >50 (H) 0 - 5 WBC/hpf   Bacteria, UA MANY (A) NONE SEEN  Comprehensive metabolic panel  Result Value Ref Range   Sodium 144 135 -  145 mmol/L   Potassium 3.5 3.5 - 5.1 mmol/L   Chloride 115 (H) 98 - 111 mmol/L   CO2 18 (L) 22 - 32 mmol/L   Glucose, Bld 118 (H) 70 - 99 mg/dL   BUN 63 (H) 8 - 23 mg/dL   Creatinine, Ser 3.16 (H) 0.44 - 1.00 mg/dL   Calcium 7.6 (L) 8.9 - 10.3 mg/dL   Total Protein 5.7 (L) 6.5 - 8.1 g/dL   Albumin 2.4 (L) 3.5 - 5.0  g/dL   AST 22 15 - 41 U/L   ALT 17 0 - 44 U/L   Alkaline Phosphatase 87 38 - 126 U/L   Total Bilirubin 0.6 0.3 - 1.2 mg/dL   GFR calc non Af Amer 12 (L) >60 mL/min   GFR calc Af Amer 14 (L) >60 mL/min   Anion gap 11 5 - 15  CBC with Differential  Result Value Ref Range   WBC 10.7 (H) 4.0 - 10.5 K/uL   RBC 3.22 (L) 3.87 - 5.11 MIL/uL   Hemoglobin 7.9 (L) 12.0 - 15.0 g/dL   HCT 27.9 (L) 36 - 46 %   MCV 86.6 80.0 - 100.0 fL   MCH 24.5 (L) 26.0 - 34.0 pg   MCHC 28.3 (L) 30.0 - 36.0 g/dL   RDW 17.5 (H) 11.5 - 15.5 %   Platelets 189 150 - 400 K/uL   nRBC 0.0 0.0 - 0.2 %   Neutrophils Relative % 68 %   Neutro Abs 7.3 1.7 - 7.7 K/uL   Lymphocytes Relative 17 %   Lymphs Abs 1.8 0.7 - 4.0 K/uL   Monocytes Relative 12 %   Monocytes Absolute 1.3 (H) 0 - 1 K/uL   Eosinophils Relative 2 %   Eosinophils Absolute 0.2 0 - 0 K/uL   Basophils Relative 0 %   Basophils Absolute 0.0 0 - 0 K/uL   Immature Granulocytes 1 %   Abs Immature Granulocytes 0.08 (H) 0.00 - 0.07 K/uL  Lipase, blood  Result Value Ref Range   Lipase 27 11 - 51 U/L  Ammonia  Result Value Ref Range   Ammonia 43 (H) 9 - 35 umol/L  Brain natriuretic peptide  Result Value Ref Range   B Natriuretic Peptide 421.6 (H) 0.0 - 100.0 pg/mL  POC occult blood, ED RN will collect  Result Value Ref Range   Fecal Occult Bld POSITIVE (A) NEGATIVE  Type and screen [place]  Result Value Ref Range   ABO/RH(D) PENDING    Antibody Screen PENDING    Sample Expiration      06/14/2020,2359 Performed at [facility], [place] [address] [place], [place] [address]   Prepare RBC (crossmatch)  Result Value Ref Range   Order Confirmation      ORDER PROCESSED BY BLOOD BANK Performed at [facility] [address], [place], [place] [address]    Laboratory interpretation all normal except possible UTI, hemoglobin has dropped from 9.3 on May 21 to 7.9 tonight.  Worsening of her chronic  renal insufficiency, mild elevation of her ammonia      CT ABDOMEN PELVIS WO CONTRAST  Result Date: 06/11/2020 CLINICAL DATA:  Abdominal pain with fever EXAM: CT ABDOMEN AND PELVIS WITHOUT CONTRAST TECHNIQUE: Multidetector CT imaging of the abdomen and pelvis was performed following the standard protocol without IV contrast. Oral contrast was administered. COMPARISON:  October 10, 2013 FINDINGS: Lower chest: There is bibasilar atelectasis. Pacemaker leads are attached to the right atrium  and right ventricle. Evidence of prosthetic mitral valve. Hepatobiliary: The liver contour is subtly nodular, a finding also noted previously. No focal liver lesions are appreciable on this noncontrast enhanced study. Gallbladder absent. There is no appreciable biliary duct dilatation. Pancreas: There is no pancreatic mass or inflammatory focus. Spleen: There is a cyst arising from the spleen measuring 3.4 x 3.2 cm. 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 Adrenals/Urinary Tract: Adrenals bilaterally appear unremarkable. There are suspected hyperdense cysts in the right kidney, largest measuring 9 x 9 mm. There is a 1.5 x 1.2 cm cyst arising from the upper pole of the left kidney. There is moderate hydronephrosis on the left. There is no hydronephrosis on the right. There is a 7 x 3 mm calculus in the lower pole of the left kidney. There is a 2 mm calculus with a nearby 1 mm calculus in the lower pole of the right kidney. There is a 1 mm calculus in the mid right kidney. There is a calculus in the proximal left ureter at the L4 level measuring 7 x 5 mm with an adjacent 2 mm calculus in this region. At the upper sacral level, there is an additional calculus in the left ureter measuring 7 x 4 mm. No ureteral calculus evident on the right. Urinary bladder is midline with wall thickness within normal limits. Stomach/Bowel: There are sigmoid diverticula without diverticulitis. There is a soft  tissue masslike area in the cecum measuring 4.9 x 4.2 cm which has attenuation value slightly higher than is expected with serous fluid. There is no appreciable bowel wall or mesenteric thickening. There is no evident bowel obstruction. There is no free air or portal venous air evident. Vascular/Lymphatic: There is aortic and iliac artery atherosclerosis. No aneurysm is evident in the aorta. There is no appreciable adenopathy in the abdomen or pelvis. Reproductive: The uterus is anteverted. There are several intrauterine calcifications, likely due to leiomyomatous change. No extrauterine pelvic mass evident. Other: The appendix is absent. No periappendiceal region inflammation. No abscess or ascites is evident in the abdomen or pelvis. There is mesenteric thickening in the mid abdomen without bowel or vascular distortion. Musculoskeletal: Bones are osteoporotic. There is stable anterior wedging at L4. There is a degree of spinal stenosis at L2-3 and L3-4 due to bony hypertrophy. No blastic or lytic lesions are evident. There are no intramuscular lesions. IMPRESSION: 1. Moderate hydronephrosis on the left. There are calculi in the mid ureteral region on the left. A calculus at the level of L4 measures 7 x 5 mm with an adjacent 2 mm calculus. There is a 7 x 4 mm calculus at the upper sacral level on the left as well. 2. Intrarenal calculi bilaterally. Probable hyperdense cysts in right kidney. 3. Soft tissue fullness in the cecal region. This area may represent localized stool. Given the somewhat masslike appearance in this area, it may be reasonable to consider direct visualization after appropriate colonic cleansing. 4. 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 5.  Apparent degree of hepatic cirrhosis. 7.  Gallbladder absent. 8.  Aortic Atherosclerosis (ICD10-I70.0). 9.  Suspected small calcified uterine leiomyomas. 10. Degree of spinal stenosis at L2-3 and L3-4 due to  bony hypertrophy. Electronically Signed   By: Lowella Grip III M.D.   On: 06/11/2020 05:08   DG Chest Port 1 View  Result Date: 06/11/2020 CLINICAL DATA:  Shortness of breath and fevers EXAM: PORTABLE CHEST 1  VIEW COMPARISON:  04/04/2020 FINDINGS: Cardiac shadow is enlarged. Postsurgical changes are again seen. Pacemaker is again noted and stable. Chronic blunting of left costophrenic angle is noted. No focal infiltrate is seen. No bony abnormality is noted. IMPRESSION: Chronic changes without acute abnormality. Electronically Signed   By: Inez Catalina M.D.   On: 06/11/2020 01:42     EKG EKG Interpretation  Date/Time:  Monday June 11 2020 01:23:17 EDT Ventricular Rate:  71 PR Interval:    QRS Duration: 157 QT Interval:  442 QTC Calculation: 481 R Axis:   -81 Text Interpretation: Ventricular-paced rhythm No significant change since last tracing 04 Apr 2020 Confirmed by Rolland Porter [phone]) on 06/11/2020 1:29:05 AM   Radiology CT ABDOMEN PELVIS WO CONTRAST  Result Date: 06/11/2020 CLINICAL DATA:  Abdominal pain with fever EXAM: CT ABDOMEN AND PELVIS WITHOUT CONTRAST TECHNIQUE: Multidetector CT imaging of the abdomen and pelvis was performed following the standard protocol without IV contrast. Oral contrast was administered. COMPARISON:  October 10, 2013 FINDINGS: Lower chest: There is bibasilar atelectasis. Pacemaker leads are attached to the right atrium and right ventricle. Evidence of prosthetic mitral valve. Hepatobiliary: The liver contour is subtly nodular, a finding also noted previously. No focal liver lesions are appreciable on this noncontrast enhanced study. Gallbladder absent. There is no appreciable biliary duct dilatation. Pancreas: There is no pancreatic mass or inflammatory focus. Spleen: There is a cyst arising from the spleen measuring 3.4 x 3.2 cm. 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 Adrenals/Urinary Tract: Adrenals bilaterally  appear unremarkable. There are suspected hyperdense cysts in the right kidney, largest measuring 9 x 9 mm. There is a 1.5 x 1.2 cm cyst arising from the upper pole of the left kidney. There is moderate hydronephrosis on the left. There is no hydronephrosis on the right. There is a 7 x 3 mm calculus in the lower pole of the left kidney. There is a 2 mm calculus with a nearby 1 mm calculus in the lower pole of the right kidney. There is a 1 mm calculus in the mid right kidney. There is a calculus in the proximal left ureter at the L4 level measuring 7 x 5 mm with an adjacent 2 mm calculus in this region. At the upper sacral level, there is an additional calculus in the left ureter measuring 7 x 4 mm. No ureteral calculus evident on the right. Urinary bladder is midline with wall thickness within normal limits. Stomach/Bowel: There are sigmoid diverticula without diverticulitis. There is a soft tissue masslike area in the cecum measuring 4.9 x 4.2 cm which has attenuation value slightly higher than is expected with serous fluid. There is no appreciable bowel wall or mesenteric thickening. There is no evident bowel obstruction. There is no free air or portal venous air evident. Vascular/Lymphatic: There is aortic and iliac artery atherosclerosis. No aneurysm is evident in the aorta. There is no appreciable adenopathy in the abdomen or pelvis. Reproductive: The uterus is anteverted. There are several intrauterine calcifications, likely due to leiomyomatous change. No extrauterine pelvic mass evident. Other: The appendix is absent. No periappendiceal region inflammation. No abscess or ascites is evident in the abdomen or pelvis. There is mesenteric thickening in the mid abdomen without bowel or vascular distortion. Musculoskeletal: Bones are osteoporotic. There is stable anterior wedging at L4. There is a degree of spinal stenosis at L2-3 and L3-4 due to bony hypertrophy. No blastic or lytic lesions are evident. There are  no intramuscular lesions.  Osteoporotic but not cardiology right hospitalist is easy to say that when you will have to do anything electronically Signed   By: Lowella Grip III M.D.   On: 06/11/2020 05:08   DG Chest Port 1 View  Result Date: 06/11/2020 CLINICAL DATA:  Shortness of breath and fevers EXAM: PORTABLE CHEST 1 VIEW COMPARISON:  04/04/2020 FINDINGS: Cardiac shadow is enlarged. Postsurgical changes are again seen. Pacemaker is again noted and stable. Chronic blunting of left costophrenic angle is noted. No focal infiltrate is seen. No bony abnormality is noted. IMPRESSION: Chronic changes without acute abnormality. Electronically Signed   By: Inez Catalina M.D.   On: 06/11/2020 01:42    Procedures Procedures (including critical care time)  Medications Ordered in ED Medications  0.9 %  sodium chloride infusion (Manually program via Guardrails IV Fluids) (has no administration in time range)  pantoprazole (PROTONIX) 80 mg in sodium chloride 0.9 % 100 mL (0.8 mg/mL) infusion (8 mg/hr Intravenous New Bag/Given 06/11/20 0507)  pantoprazole (PROTONIX) injection 40 mg (has no administration in time range)  iohexol (OMNIPAQUE) 9 MG/ML oral solution (1,000 mLs Oral Contrast Given 06/11/20 0334)  cefTRIAXone (ROCEPHIN) 1 g in sodium chloride 0.9 % 100 mL IVPB (0 g Intravenous Stopped 06/11/20 0458)  pantoprazole (PROTONIX) 80 mg in sodium chloride 0.9 % 100 mL IVPB (0 mg Intravenous Stopped 06/11/20 1025)    ED Course  I have reviewed the triage vital signs and the nursing notes.  Pertinent labs & imaging results that were available during my care of the patient were reviewed by me and considered in my medical decision making (see chart for details).    MDM Rules/Calculators/A&P                         Laboratory testing was done including CT of the abdomen to evaluate her complaints of abdominal pain.   3:00 AM daughters at bedside.  She states patient has been having black stools.  She  did have an endoscopy earlier this week and was found to have gastritis.  Biopsy was done and it was negative for H. pylori.  Her esophagus appeared normal and she did have dilatation done.  Patient was started on Protonix bolus and drip.  Patient's urine did come back consistent with UTI, she was started on IV Rocephin.  When I checked her report her last urinary culture was about 6 years ago.  At that time she had 100,000 colonies of Klebsiella oxytocin that was sensitive to Rocephin.  Patient's hemoglobin has dropped about 1-1/2 g over the past couple weeks.  Daughter states her hemoglobin had been normal in September at 10.  Patient CT has returned and she is noted to have a couple of left ureteral stones with hydronephrosis.  This probably explains her worsening of her baseline renal insufficiency.  I did not call urology since it is close to the change of call schedule and she is stable enough to wait for them to place a stent this morning.  5:39 AM Dr. Sidney Ace, hospitalist will admit.  I informed daughter and patient about the need for admission.  She states that the patient has been having some confusion that seem to start just a couple weeks ago and seems to be getting rapidly worse.  We discussed this could be from her urinary tract infection.  Final Clinical Impression(s) / ED Diagnoses Final diagnoses:  Left ureteral stone  Urinary tract infection without  hematuria, site unspecified  Anemia, unspecified type  Acute on chronic renal insufficiency  Gastritis with hemorrhage, unspecified chronicity, unspecified gastritis type    Rx / DC Orders  Plan admission  Rolland Porter, MD, Barbette Or, MD 06/11/20 [phone]

## 2020-06-11 NOTE — Op Note (Signed)
Procedure: 1.  Cystoscopy with left retrograde pyelogram and interpretation. 2.  Cystoscopy with insertion of left double-J stent. 3.  Application of fluoroscopy.  Preop diagnosis: Left proximal ureteral stone with obstruction and sepsis.  Postop diagnosis: Same.  Surgeon: Dr. Irine Seal.  Anesthesia: MAC.  Drain: 6 [place] by 24 cm left contour double-J stent without tether.  Specimen: Urine culture from left kidney.  EBL: None.  Complications: Possible ureteral false passage.  Indications: The patient is a 86 year old female who presented with low-grade fever, abdominal pain and tarry stools.  She was found in the emergency room to have a probable urinary tract infection and a CT scan demonstrated a 7 mm left proximal ureteral stone with mild obstruction some renal stones as well.  It was felt that stenting was indicated.  Procedure: She had been given Rocephin earlier today.  She was taken operating room where she was placed on the table in the lithotomy position and fitted with PAS hose.  Her perineum and genitalia were prepped with Betadine solution and she was draped in the usual sterile fashion.  She was given sedation as needed throughout the procedure.  Cystoscopy was performed using a 23 [place] scope and 30 degree lens.  Initial visualization was poor because of very turbid urine but after bladder irrigation she was noted to have a smooth bladder wall with a generally pale mucosa but in the area of the trigone, particularly on the left there was erythema consistent with inflammation and cystitis cystica.  A 5 French opening catheter was passed up the left ureteral orifice with the aid of a sensor wire however approximately at the level of the  pelvic brim there was resistance to passage a lateral bend and the wire.  The wire was removed and contrast was instilled  The retrograde pyelogram demonstrated a filling defect that was in the ureter distal to the tip of the ureteral  catheter which was consistent with the stone in the point of concern was merely a  lateral kink in the ureter.  The ureter was somewhat irregular all the way up to the collecting system which had some mild dilation.  There was another kink just below the UPJ.  At this point an angled tipped Glidewire was used through the open ended catheter and I was able to negotiate this into what I felt was the renal pelvis.  The open-ended catheter was then advanced over the wire into the area of the renal pelvis but there was some resistance.  The wire was removed and there was no hydronephrotic drip.  Gentle instillation of contrast was performed and a bleb appeared to raise on the medial aspect of the renal pelvis suggesting a submucosal location of the catheter tip.  There was also some contrast in an irregular band lateral to the lower pole of the kidney that I did not appreciate on initial imaging, but I am not sure if this is extravasation or contrast in the bowel from her prior CT scan.  The ureteral catheter was backed up and several maneuvers were made with both the Glidewire and the sensor wire and I was able to eventually get the wire to coil in an upper calyx.  The ureteral catheter was reinserted and the wire was removed.  There was drainage of a small amount of purulent urine which was collected for culture.  The Glidewire was then reinserted into the upper calyx and then using careful fluoroscopic monitoring, the ureteral catheter was removed and a  6 [place] by 24 cm contour double-J stent was advanced to the kidney under fluoroscopic guidance.  The wire was removed, leaving a good coil in the upper calyx and a good coil in the bladder.  The cystoscope confirmed the distal coil and was then removed.  A 16 French Foley catheter was inserted and placed to straight drainage.  She was taken down from lithotomy position and moved to recovery room in guarded condition.

## 2020-06-11 NOTE — ED Notes (Signed)
Dr Segal at bedside.  

## 2020-06-11 NOTE — H&P (Addendum)
History and Physical        Hospital Admission Note Date: 06/11/2020  Patient name: [name] Medical record number: [id number] Date of birth: [birth year] Age: 86 y.o. Gender: female  PCP: [name], MD  Patient coming from: [facility] At baseline, ambulates: Independently  Chief Complaint    Abdominal pain     HPI:   This is a 86 year old female with past medical history of tachybradycardia syndrome s/p BiV pacemaker in 2007, severe MR s/p repair, chronic atrial fibrillation not on anticoagulation due to GI bleed, abnormal esophageal motility consistent with presbyesophagus, gastritis noted on recent endoscopy on July 20 with balloon dilatation with [name], group 2 pulmonary hypertension, gout who presented to [facility] ED on 7/26 via EMS with 2 days of urinary tract symptoms of dysuria, odorous dark urine and right upper and abdominal pain, EMS reported mainly right-sided abdominal pain.  Noted to have a T of 99.6 and was given Tylenol in route, otherwise vital stable.  Daughter at bedside reported black stools for the past several weeks which have been worsening as well as increasing confusion and pallor.  ED Course: Soft BPs in the 90s to 100s otherwise stable on room air.  Notable lab work: CO2 18, BUN/creatinine 63/3.16 (creatinine 1.5 in May), ammonia 43, BNP 421, WBC 10.7, Hb 7.9, positive UA, positive FOBT.  CT abdomen pelvis without contrast: Moderate hydronephrosis on the left with renal calculi as well as soft tissue fullness in the cecal region.  She was started on Rocephin and and given 1 unit PRBCs hospitalist called for admit.  Vitals:   06/11/20 0840 06/11/20 1100  BP: (!) 100/49 (!) 106/49  Pulse: 72 70  Resp: (!) 27 19  Temp: 97.6 F (36.4 C)   SpO2: 99% 99%     Review of Systems:  Review of Systems  Constitutional: Negative for  chills and fever.  Respiratory: Positive for shortness of breath. Negative for wheezing.   Cardiovascular: Negative for chest pain and palpitations.  Gastrointestinal: Positive for abdominal pain and blood in stool. Negative for nausea and vomiting.  Genitourinary: Positive for dysuria.  Neurological: Positive for weakness.  All other systems reviewed and are negative.   Medical/Social/Family History   Past Medical History: Past Medical History:  Diagnosis Date  . Anemia   . Brady-tachy syndrome ([place])    a. 2003: post-op afib after MR repair then symptomatic bradycardia requiring pacemaker. b. Upgrade to [employer] Bi-V Pacemaker 2007.  . Cellulitis and abscess of foot 08/15/2015   RT FOOT  . Chronic atrial fibrillation ([place])    a. Previously failed DCCV/amio/tikosyn.  CHADS2VASC score 5 - not on anticoagulation due to history of GI bleeding.    . Chronic combined systolic and diastolic CHF (congestive heart failure) (HCC)    a. EF 35-40% on echo 09/2013 b. echo 04/2016: EF 50-55% with mild to moderate MS.  . Cirrhosis ([place])    a. Newly recognized 09/2013 - by CT.  . CKD (chronic kidney disease)   . GI bleeding   . Hypertension   . Mitral valve disorder    a. Severe MR s/p repair 2003 (28 mm annuloplasty ring and and oversew of LAA). No CAD  by cath at that time.  [name] NSVT (nonsustained ventricular tachycardia) ([place])    a. Isolated event during 09/2007 adm.  . NSVT (nonsustained ventricular tachycardia) ([place])   . Orthostatic hypotension dysautonomic syndrome ([place])   . Pulmonary hypertension (HCC)    Group II secondary to CHF and MV disease  . Spinal stenosis    shes recieved epidural steroid injections in the past  . Tricuspid regurgitation     Past Surgical History:  Procedure Laterality Date  . APPENDECTOMY    . BALLOON DILATION N/A 06/05/2020   Procedure: BALLOON DILATION;  Surgeon: Otis Brace, MD;  Location: [facility];  Service: Gastroenterology;  Laterality:  N/A;  . BIOPSY  06/05/2020   Procedure: BIOPSY;  Surgeon: Otis Brace, MD;  Location: [facility];  Service: Gastroenterology;;  . BIV PACEMAKER GENERATOR CHANGE OUT N/A 12/13/2014   Procedure: BIV PACEMAKER GENERATOR CHANGE OUT;  Surgeon: Evans Lance, MD;  Location: [facility] CATH LAB;  Service: Cardiovascular;  Laterality: N/A;  . CHOLECYSTECTOMY    . ESOPHAGOGASTRODUODENOSCOPY (EGD) WITH PROPOFOL N/A 06/05/2020   Procedure: ESOPHAGOGASTRODUODENOSCOPY (EGD) WITH PROPOFOL;  Surgeon: Otis Brace, MD;  Location: [facility];  Service: Gastroenterology;  Laterality: N/A;  . IRRIGATION AND DEBRIDEMENT ABSCESS N/A 10/14/2013   Procedure: IRRIGATION AND DEBRIDEMENT PERINEAL ABSCESS;  Surgeon: Rolm Bookbinder, MD;  Location: [facility];  Service: General;  Laterality: N/A;  . VALVE REPLACEMENT     sever mitral regurgitation s/p mitral valve annuloplasty ring    Medications: Prior to Admission medications   Medication Sig Start Date End Date Taking? Authorizing Provider  acetaminophen (TYLENOL) 500 MG tablet Take 1,000 mg by mouth every 6 (six) hours as needed for mild pain.    Yes [provider]  colchicine 0.6 MG tablet Take 0.6 mg by mouth as needed (Gout flare).   Yes [provider]  Febuxostat 80 MG TABS Take 80 mg by mouth daily.   Yes [provider]  ferrous sulfate 325 (65 FE) MG tablet Take 1 tablet (325 mg total) by mouth 2 (two) times daily with a meal. 05/22/16  Yes Strader, [place] M, PA-C  midodrine (PROAMATINE) 5 MG tablet Take 1 tablet (5 mg total) by mouth 3 (three) times daily with meals. 05/23/20  Yes Turner, Eber Hong, MD  Multiple Vitamin (MULTIVITAMIN WITH MINERALS) TABS tablet Take 1 tablet by mouth daily.   Yes [provider]  Multiple Vitamins-Minerals (PRESERVISION AREDS 2 PO) Take 2 tablets by mouth daily.   Yes [provider]  pantoprazole (PROTONIX) 40 MG tablet Take 40 mg by mouth every morning. 05/30/20  Yes [provider]  potassium chloride SA (KLOR-CON) 20 MEQ tablet Take 1 tablet (20 mEq total) by mouth daily. 04/07/20  Yes Vann, Jessica U, DO  torsemide (DEMADEX) 20 MG tablet Take 1 tablet (20 mg total) by mouth daily. 04/26/20  Yes Sueanne Margarita, MD    Allergies:  No Known Allergies  Social History:  reports that she quit smoking about 48 years ago. She has never used smokeless tobacco. She reports current alcohol use. She reports that she does not use drugs.  Family History: Family History  Problem Relation Age of Onset  . Pancreatic cancer Father   . Other Mother        Mother died at 93 with no real medical problems     Objective   Physical Exam: Blood pressure (!) 106/49, pulse 70, temperature 97.6 F (36.4 C), temperature source Oral, resp. rate 19,  height 5\' 2"  (1.575 m), weight 88 kg, last menstrual period 10/10/2013, SpO2 99 %.  Physical Exam Vitals and nursing note reviewed. Exam conducted with a chaperone present.  Constitutional:      Appearance: She is ill-appearing.  HENT:     Head: Normocephalic and atraumatic.  Eyes:     Conjunctiva/sclera: Conjunctivae normal.  Cardiovascular:     Rate and Rhythm: Normal rate and regular rhythm.  Pulmonary:     Effort: Pulmonary effort is normal.     Breath sounds: Normal breath sounds.  Abdominal:     General: Abdomen is flat.     Tenderness: There is abdominal tenderness.  Musculoskeletal:     Right lower leg: Edema present.     Left lower leg: Edema present.  Skin:    Coloration: Skin is pale. Skin is not jaundiced.  Neurological:     Mental Status: She is alert.  Psychiatric:        Mood and Affect: Mood normal.        Behavior: Behavior normal.     LABS on Admission: I have personally reviewed all the labs and imaging below    Basic Metabolic Panel: Recent Labs  Lab 06/11/20 0123  NA 144  K 3.5  CL 115*  CO2 18*  GLUCOSE 118*  BUN 63*  CREATININE 3.16*  CALCIUM 7.6*   Liver Function  Tests: Recent Labs  Lab 06/11/20 0123  AST 22  ALT 17  ALKPHOS 87  BILITOT 0.6  PROT 5.7*  ALBUMIN 2.4*   Recent Labs  Lab 06/11/20 0123  LIPASE 27   Recent Labs  Lab 06/11/20 0123  AMMONIA 43*   CBC: Recent Labs  Lab 06/11/20 0123  WBC 10.7*  NEUTROABS 7.3  HGB 7.9*  HCT 27.9*  MCV 86.6  PLT 189   Cardiac Enzymes: No results for input(s): CKTOTAL, CKMB, CKMBINDEX, TROPONINI in the last 168 hours. BNP: Invalid input(s): POCBNP CBG: No results for input(s): GLUCAP in the last 168 hours.  Radiological Exams on Admission:  CT ABDOMEN PELVIS WO CONTRAST  Result Date: 06/11/2020 CLINICAL DATA:  Abdominal pain with fever EXAM: CT ABDOMEN AND PELVIS WITHOUT CONTRAST TECHNIQUE: Multidetector CT imaging of the abdomen and pelvis was performed following the standard protocol without IV contrast. Oral contrast was administered. COMPARISON:  October 10, 2013 FINDINGS: Lower chest: There is bibasilar atelectasis. Pacemaker leads are attached to the right atrium and right ventricle. Evidence of prosthetic mitral valve. Hepatobiliary: The liver contour is subtly nodular, a finding also noted previously. No focal liver lesions are appreciable on this noncontrast enhanced study. Gallbladder absent. There is no appreciable biliary duct dilatation. Pancreas: There is no pancreatic mass or inflammatory focus. Spleen: There is a cyst arising from the spleen measuring 3.4 x 3.2 cm. 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 Adrenals/Urinary Tract: Adrenals bilaterally appear unremarkable. There are suspected hyperdense cysts in the right kidney, largest measuring 9 x 9 mm. There is a 1.5 x 1.2 cm cyst arising from the upper pole of the left kidney. There is moderate hydronephrosis on the left. There is no hydronephrosis on the right. There is a 7 x 3 mm calculus in the lower pole of the left kidney. There is a 2 mm calculus with a nearby 1 mm calculus in the  lower pole of the right kidney. There is a 1 mm calculus in the mid right kidney. There is a calculus in the proximal left ureter  at the L4 level measuring 7 x 5 mm with an adjacent 2 mm calculus in this region. At the upper sacral level, there is an additional calculus in the left ureter measuring 7 x 4 mm. No ureteral calculus evident on the right. Urinary bladder is midline with wall thickness within normal limits. Stomach/Bowel: There are sigmoid diverticula without diverticulitis. There is a soft tissue masslike area in the cecum measuring 4.9 x 4.2 cm which has attenuation value slightly higher than is expected with serous fluid. There is no appreciable bowel wall or mesenteric thickening. There is no evident bowel obstruction. There is no free air or portal venous air evident. Vascular/Lymphatic: There is aortic and iliac artery atherosclerosis. No aneurysm is evident in the aorta. There is no appreciable adenopathy in the abdomen or pelvis. Reproductive: The uterus is anteverted. There are several intrauterine calcifications, likely due to leiomyomatous change. No extrauterine pelvic mass evident. Other: The appendix is absent. No periappendiceal region inflammation. No abscess or ascites is evident in the abdomen or pelvis. There is mesenteric thickening in the mid abdomen without bowel or vascular distortion. Musculoskeletal: Bones are osteoporotic. There is stable anterior wedging at L4. There is a degree of spinal stenosis at L2-3 and L3-4 due to bony hypertrophy. No blastic or lytic lesions are evident. There are no intramuscular lesions. IMPRESSION: 1. Moderate hydronephrosis on the left. There are calculi in the mid ureteral region on the left. A calculus at the level of L4 measures 7 x 5 mm with an adjacent 2 mm calculus. There is a 7 x 4 mm calculus at the upper sacral level on the left as well. 2. Intrarenal calculi bilaterally. Probable hyperdense cysts in right kidney. 3. Soft tissue fullness in  the cecal region. This area may represent localized stool. Given the somewhat masslike appearance in this area, it may be reasonable to consider direct visualization after appropriate colonic cleansing. 4. 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 5.  Apparent degree of hepatic cirrhosis. 7.  Gallbladder absent. 8.  Aortic Atherosclerosis (ICD10-I70.0). 9.  Suspected small calcified uterine leiomyomas. 10. Degree of spinal stenosis at L2-3 and L3-4 due to bony hypertrophy. Electronically Signed   By: Lowella Grip III M.D.   On: 06/11/2020 05:08   DG Chest Port 1 View  Result Date: 06/11/2020 CLINICAL DATA:  Shortness of breath and fevers EXAM: PORTABLE CHEST 1 VIEW COMPARISON:  04/04/2020 FINDINGS: Cardiac shadow is enlarged. Postsurgical changes are again seen. Pacemaker is again noted and stable. Chronic blunting of left costophrenic angle is noted. No focal infiltrate is seen. No bony abnormality is noted. IMPRESSION: Chronic changes without acute abnormality. Electronically Signed   By: Inez Catalina M.D.   On: 06/11/2020 01:42      EKG: Independently reviewed.  A. fib   A & P   Principal Problem:   Acute lower UTI Active Problems:   Biventricular cardiac pacemaker in situ   Permanent atrial fibrillation (HCC)   Gout   S/P MVR (mitral valve repair)   AKI (acute kidney injury) ([place])   GI bleeding   Obstructive uropathy   1. SIRS secondary to UTI and GI bleed a. Afebrile with leukocytosis and tachypnea b. Continue Rocephin c. Follow-up urine cultures d. Gentle IV fluids with history of systolic heart failure and appears slightly volume overloaded and about to get blood  2. Obstructive uropathy with moderate left hydronephrosis a. Urology consulted b. Continue ceftriaxone and fluids as above  3. AKI on CKD 3b a. Secondary to above b. Hold home torsemide, colchicine and febuxostat  4. Acute blood loss anemia secondary to GI  bleed a. Recent endoscopy on 7/20 with [name] b. Hb 7.9, down from 9.3 in May c. Received 1 unit PRBCs in the ED d. GI consulted  5. Atrial fibrillation a. Rate controlled with BiV pacemaker b. Not on anticoagulation due to history of GI bleeds c. Continue telemetry  6. Chronic hypotension a. Continue home midodrine  7. Combined systolic and diastolic heart failure, in mild exacerbation a. BNP minimally elevated 421 b. Volume status is tenuous and does have lower extremity edema and is receiving a unit of blood c. will give a low-dose of Lasix for now post blood transfusion d. Follow-up renal function e. Daily weights and I/O    DVT prophylaxis: SCDs   Code Status: DNR  Diet: N.p.o. Family Communication: Admission, patients condition and plan of care including tests being ordered have been discussed with the patient who indicates understanding and agrees with the plan and Code Status. Patient's daughter at bedside was updated  Disposition Plan: The appropriate patient status for this patient is INPATIENT. Inpatient status is judged to be reasonable and necessary in order to provide the required intensity of service to ensure the patient's safety. [name] presenting symptoms, physical exam findings, and initial radiographic and laboratory data in the context of their chronic comorbidities is felt to place them at high risk for further clinical deterioration. Furthermore, it is not anticipated that the patient will be medically stable for discharge from the hospital within 2 midnights of admission. The following factors support the patient status of inpatient.   " [name] presenting symptoms include shortness of breath, abdominal pain, UTI symptoms, GI bleed. " The worrisome physical exam findings include pallor, abdominal pain, lower extremity edema. " The initial radiographic and laboratory data are worrisome because of elevated BNP, anemia or leukocytosis " The  chronic co-morbidities include chronic hypertension, combined systolic diastolic heart failure.   * I certify that at the point of admission it is my clinical judgment that the patient will require inpatient hospital care spanning beyond 2 midnights from the point of admission due to high intensity of service, high risk for further deterioration and high frequency of surveillance required.*   Status is: Inpatient  Remains inpatient appropriate because:Ongoing diagnostic testing needed not appropriate for outpatient work up, IV treatments appropriate due to intensity of illness or inability to take PO and Inpatient level of care appropriate due to severity of illness   Dispo: The patient is from: SNF              Anticipated d/c is to: SNF              Anticipated d/c date is: > 3 days              Patient currently is not medically stable to d/c.        The medical decision making on this patient was of high complexity and the patient is at high risk for clinical deterioration, therefore this is a level 3  admission.  Consultants  . GI . Urology  Procedures  . 1 u PRBC  Time Spent on Admission: 80 minutes    Harold Hedge, DO [facility] Pager [phone] 06/11/2020, 11:11 AM

## 2020-06-11 NOTE — Consult Note (Signed)
Referring Provider: ED Primary Care Physician:  [name], MD Primary Gastroenterologist:  [name]  Reason for Consultation:    HPI: [name] is a 86 y.o. female with past medical history noted below to include CHF (EF 40%) and upper GI bleeding (2016, on anticoagulation at the time) presenting for consultation of anemia and melena.  Patient is slightly confused but able to answer most questions appropriately.  Daughter at bedside was able to help provide history.  Presented to the ED due to lower abdominal pain and was found to have moderate hydronephrosis (left) and kidney stones bilaterally, as well as UTI.  She was also anemic on arrival with hemoglobin 7.9 today.  She notes recent melenic stools which have loose and persistent for weeks to months.  She typically has 3-4 loose black stools per day.  Denies any hematochezia.  Has had chronic dysphagia and has esophageal dysmotility.  Esophagus was dilated, but she still notes persistent dysphagia to both solids and liquids.  Denies any recent nausea, vomiting, or hematemesis.  Denies any unexplained weight loss.  Denies any aspirin or NSAID use.  Denies any anticoagulation use.  Denies any family history of colon cancer or gastrointestinal malignancy.  Patient's daughter states patient had a colonoscopy approximately 5 years ago which was pertinent only for one polyp (report not available, [place]  CT 06/11/20: Soft tissue fullness in the cecal region. This area may represent localized stool. Given the somewhat masslike appearance in this area, it may be reasonable to consider direct visualization after appropriate colonic cleansing. 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  EGD 06/05/20: Abnormal esophageal motility, consistent with presbyesophagus.  No endoscopic esophageal abnormality to explain patient's dysphagia. Esophagus dilated. Gastritis  (bx: chronic gastritis, negative for H. Pylori).   Past Medical History:  Diagnosis Date  . Anemia   . Brady-tachy syndrome ([place])    a. 2003: post-op afib after MR repair then symptomatic bradycardia requiring pacemaker. b. Upgrade to Medtronic Bi-V Pacemaker 2007.  . Cellulitis and abscess of foot 08/15/2015   RT FOOT  . Chronic atrial fibrillation ([place])    a. Previously failed DCCV/amio/tikosyn.  CHADS2VASC score 5 - not on anticoagulation due to history of GI bleeding.    . Chronic combined systolic and diastolic CHF (congestive heart failure) (HCC)    a. EF 35-40% on echo 09/2013 b. echo 04/2016: EF 50-55% with mild to moderate MS.  . Cirrhosis ([place])    a. Newly recognized 09/2013 - by CT.  . CKD (chronic kidney disease)   . GI bleeding   . Hypertension   . Mitral valve disorder    a. Severe MR s/p repair 2003 (28 mm annuloplasty ring and and oversew of LAA). No CAD by cath at that time.  [name] NSVT (nonsustained ventricular tachycardia) ([place])    a. Isolated event during 09/2007 adm.  . NSVT (nonsustained ventricular tachycardia) ([place])   . Orthostatic hypotension dysautonomic syndrome ([place])   . Pulmonary hypertension (HCC)    Group II secondary to CHF and MV disease  . Spinal stenosis    shes recieved epidural steroid injections in the past  . Tricuspid regurgitation     Past Surgical History:  Procedure Laterality Date  . APPENDECTOMY    . BALLOON DILATION N/A 06/05/2020   Procedure: BALLOON DILATION;  Surgeon: Otis Brace, MD;  Location: [facility];  Service: Gastroenterology;  Laterality: N/A;  . BIOPSY  06/05/2020  Procedure: BIOPSY;  Surgeon: Otis Brace, MD;  Location: [facility];  Service: Gastroenterology;;  . BIV PACEMAKER GENERATOR CHANGE OUT N/A 12/13/2014   Procedure: BIV PACEMAKER GENERATOR CHANGE OUT;  Surgeon: Evans Lance, MD;  Location: [facility] CATH LAB;  Service: Cardiovascular;  Laterality: N/A;  . CHOLECYSTECTOMY    . ESOPHAGOGASTRODUODENOSCOPY  (EGD) WITH PROPOFOL N/A 06/05/2020   Procedure: ESOPHAGOGASTRODUODENOSCOPY (EGD) WITH PROPOFOL;  Surgeon: Otis Brace, MD;  Location: [facility];  Service: Gastroenterology;  Laterality: N/A;  . IRRIGATION AND DEBRIDEMENT ABSCESS N/A 10/14/2013   Procedure: IRRIGATION AND DEBRIDEMENT PERINEAL ABSCESS;  Surgeon: Rolm Bookbinder, MD;  Location: [facility];  Service: General;  Laterality: N/A;  . VALVE REPLACEMENT     sever mitral regurgitation s/p mitral valve annuloplasty ring    Prior to Admission medications   Medication Sig Start Date End Date Taking? Authorizing Provider  acetaminophen (TYLENOL) 500 MG tablet Take 1,000 mg by mouth every 6 (six) hours as needed for mild pain.    Yes [provider]  colchicine 0.6 MG tablet Take 0.6 mg by mouth as needed (Gout flare).   Yes [provider]  Febuxostat 80 MG TABS Take 80 mg by mouth daily.   Yes [provider]  ferrous sulfate 325 (65 FE) MG tablet Take 1 tablet (325 mg total) by mouth 2 (two) times daily with a meal. 05/22/16  Yes Strader, [place] M, PA-C  midodrine (PROAMATINE) 5 MG tablet Take 1 tablet (5 mg total) by mouth 3 (three) times daily with meals. 05/23/20  Yes Turner, Eber Hong, MD  Multiple Vitamin (MULTIVITAMIN WITH MINERALS) TABS tablet Take 1 tablet by mouth daily.   Yes [provider]  Multiple Vitamins-Minerals (PRESERVISION AREDS 2 PO) Take 2 tablets by mouth daily.   Yes [provider]  pantoprazole (PROTONIX) 40 MG tablet Take 40 mg by mouth every morning. 05/30/20  Yes [provider]  potassium chloride SA (KLOR-CON) 20 MEQ tablet Take 1 tablet (20 mEq total) by mouth daily. 04/07/20  Yes Vann, Jessica U, DO  torsemide (DEMADEX) 20 MG tablet Take 1 tablet (20 mg total) by mouth daily. 04/26/20  Yes Sueanne Margarita, MD    Scheduled Meds: . [START ON 06/14/2020] pantoprazole  40 mg Intravenous Q12H   Continuous Infusions: . pantoprozole (PROTONIX) infusion 8 mg/hr  (06/11/20 0507)   PRN Meds:.  Allergies as of 06/11/2020  . (No Known Allergies)    Family History  Problem Relation Age of Onset  . Pancreatic cancer Father   . Other Mother        Mother died at 57 with no real medical problems    Social History   Socioeconomic History  . Marital status: Single    Spouse name: Not on file  . Number of children: Not on file  . Years of education: Not on file  . Highest education level: Not on file  Occupational History  . Not on file  Tobacco Use  . Smoking status: Former Smoker    Quit date: 10/11/1971    Years since quitting: 48.7  . Smokeless tobacco: Never Used  Substance and Sexual Activity  . Alcohol use: Yes    Comment: rare  . Drug use: No  . Sexual activity: Not on file  Other Topics Concern  . Not on file  Social History Narrative  . Not on file   Social Determinants of Health   Financial Resource Strain:   . Difficulty of Paying Living Expenses:  Food Insecurity:   . Worried About Charity fundraiser in the Last Year:   . Arboriculturist in the Last Year:   Transportation Needs:   . Film/video editor (Medical):   [name] Lack of Transportation (Non-Medical):   Physical Activity:   . Days of Exercise per Week:   . Minutes of Exercise per Session:   Stress:   . Feeling of Stress :   Social Connections:   . Frequency of Communication with Friends and Family:   . Frequency of Social Gatherings with Friends and Family:   . Attends Religious Services:   . Active Member of Clubs or Organizations:   . Attends Archivist Meetings:   [name] Marital Status:   Intimate Partner Violence:   . Fear of Current or Ex-Partner:   . Emotionally Abused:   [name] Physically Abused:   . Sexually Abused:     Review of Systems: Review of Systems  Constitutional: Negative for chills, fever and weight loss.  HENT: Negative for sinus pain and sore throat.   Eyes: Negative for pain and redness.  Respiratory: Negative for cough  and shortness of breath.   Cardiovascular: Negative for chest pain and palpitations.  Gastrointestinal: Positive for abdominal pain (LLQ), diarrhea and melena. Negative for blood in stool, constipation, heartburn, nausea and vomiting.  Genitourinary: Positive for dysuria. Negative for urgency.  Musculoskeletal: Negative for falls and joint pain.  Skin: Negative for itching and rash.  Neurological: Negative for seizures and loss of consciousness.  Endo/Heme/Allergies: Negative for polydipsia. Does not bruise/bleed easily.  Psychiatric/Behavioral: Negative for substance abuse. The patient is not nervous/anxious.    Physical Exam: Vital signs: Vitals:   06/11/20 0800 06/11/20 0840  BP: (!) 102/50 (!) 100/49  Pulse: 70 72  Resp: (!) 27 (!) 27  Temp:  97.6 F (36.4 C)  SpO2: 99% 99%     Physical Exam Constitutional:      General: She is not in acute distress.    Appearance: Normal appearance.  HENT:     Head: Normocephalic and atraumatic.     Nose: Nose normal.     Mouth/Throat:     Mouth: Mucous membranes are moist.     Pharynx: Oropharynx is clear.  Eyes:     Extraocular Movements: Extraocular movements intact.     Comments: Conjunctival pallor  Cardiovascular:     Rate and Rhythm: Normal rate and regular rhythm.     Pulses: Normal pulses.  Pulmonary:     Effort: Tachypnea present. No respiratory distress.     Breath sounds: Normal breath sounds.  Abdominal:     General: Bowel sounds are normal. There is no distension.     Palpations: Abdomen is soft. There is no mass.     Tenderness: There is abdominal tenderness (LLQ, suprapubic). There is guarding. There is no rebound.     Hernia: No hernia is present.  Musculoskeletal:     Cervical back: Normal range of motion and neck supple.     Right lower leg: Edema present.     Left lower leg: Edema present.  Skin:    General: Skin is warm and dry.  Neurological:     General: No focal deficit present.     Mental Status: She  is lethargic.     Comments: Oriented to self.  Able to report location.  Appears oriented to situation.  Able to report year but not month.  Unable to report president.  Psychiatric:  Mood and Affect: Mood normal.        Behavior: Behavior normal.     GI:  Lab Results: Recent Labs    06/11/20 0123  WBC 10.7*  HGB 7.9*  HCT 27.9*  PLT 189   BMET Recent Labs    06/11/20 0123  NA 144  K 3.5  CL 115*  CO2 18*  GLUCOSE 118*  BUN 63*  CREATININE 3.16*  CALCIUM 7.6*   LFT Recent Labs    06/11/20 0123  PROT 5.7*  ALBUMIN 2.4*  AST 22  ALT 17  ALKPHOS 87  BILITOT 0.6   PT/INR No results for input(s): LABPROT, INR in the last 72 hours.   Studies/Results: CT ABDOMEN PELVIS WO CONTRAST  Result Date: 06/11/2020 CLINICAL DATA:  Abdominal pain with fever EXAM: CT ABDOMEN AND PELVIS WITHOUT CONTRAST TECHNIQUE: Multidetector CT imaging of the abdomen and pelvis was performed following the standard protocol without IV contrast. Oral contrast was administered. COMPARISON:  October 10, 2013 FINDINGS: Lower chest: There is bibasilar atelectasis. Pacemaker leads are attached to the right atrium and right ventricle. Evidence of prosthetic mitral valve. Hepatobiliary: The liver contour is subtly nodular, a finding also noted previously. No focal liver lesions are appreciable on this noncontrast enhanced study. Gallbladder absent. There is no appreciable biliary duct dilatation. Pancreas: There is no pancreatic mass or inflammatory focus. Spleen: There is a cyst arising from the spleen measuring 3.4 x 3.2 cm. 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 Adrenals/Urinary Tract: Adrenals bilaterally appear unremarkable. There are suspected hyperdense cysts in the right kidney, largest measuring 9 x 9 mm. There is a 1.5 x 1.2 cm cyst arising from the upper pole of the left kidney. There is moderate hydronephrosis on the left. There is no hydronephrosis on  the right. There is a 7 x 3 mm calculus in the lower pole of the left kidney. There is a 2 mm calculus with a nearby 1 mm calculus in the lower pole of the right kidney. There is a 1 mm calculus in the mid right kidney. There is a calculus in the proximal left ureter at the L4 level measuring 7 x 5 mm with an adjacent 2 mm calculus in this region. At the upper sacral level, there is an additional calculus in the left ureter measuring 7 x 4 mm. No ureteral calculus evident on the right. Urinary bladder is midline with wall thickness within normal limits. Stomach/Bowel: There are sigmoid diverticula without diverticulitis. There is a soft tissue masslike area in the cecum measuring 4.9 x 4.2 cm which has attenuation value slightly higher than is expected with serous fluid. There is no appreciable bowel wall or mesenteric thickening. There is no evident bowel obstruction. There is no free air or portal venous air evident. Vascular/Lymphatic: There is aortic and iliac artery atherosclerosis. No aneurysm is evident in the aorta. There is no appreciable adenopathy in the abdomen or pelvis. Reproductive: The uterus is anteverted. There are several intrauterine calcifications, likely due to leiomyomatous change. No extrauterine pelvic mass evident. Other: The appendix is absent. No periappendiceal region inflammation. No abscess or ascites is evident in the abdomen or pelvis. There is mesenteric thickening in the mid abdomen without bowel or vascular distortion. Musculoskeletal: Bones are osteoporotic. There is stable anterior wedging at L4. There is a degree of spinal stenosis at L2-3 and L3-4 due to bony hypertrophy. No blastic or lytic lesions are evident. There are no intramuscular lesions.  IMPRESSION: 1. Moderate hydronephrosis on the left. There are calculi in the mid ureteral region on the left. A calculus at the level of L4 measures 7 x 5 mm with an adjacent 2 mm calculus. There is a 7 x 4 mm calculus at the upper  sacral level on the left as well. 2. Intrarenal calculi bilaterally. Probable hyperdense cysts in right kidney. 3. Soft tissue fullness in the cecal region. This area may represent localized stool. Given the somewhat masslike appearance in this area, it may be reasonable to consider direct visualization after appropriate colonic cleansing. 4. 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 5.  Apparent degree of hepatic cirrhosis. 7.  Gallbladder absent. 8.  Aortic Atherosclerosis (ICD10-I70.0). 9.  Suspected small calcified uterine leiomyomas. 10. Degree of spinal stenosis at L2-3 and L3-4 due to bony hypertrophy. Electronically Signed   By: Lowella Grip III M.D.   On: 06/11/2020 05:08   DG Chest Port 1 View  Result Date: 06/11/2020 CLINICAL DATA:  Shortness of breath and fevers EXAM: PORTABLE CHEST 1 VIEW COMPARISON:  04/04/2020 FINDINGS: Cardiac shadow is enlarged. Postsurgical changes are again seen. Pacemaker is again noted and stable. Chronic blunting of left costophrenic angle is noted. No focal infiltrate is seen. No bony abnormality is noted. IMPRESSION: Chronic changes without acute abnormality. Electronically Signed   By: Inez Catalina M.D.   On: 06/11/2020 01:42    Impression: Melena and anemia: Possibly from right side of the colon.  Doubt upper GI bleeding, she had a fairly normal EGD on 7/20 which was pertinent only for gastritis. -Hgb 7.9 today -CT 06/11/2020: Soft tissue fullness in the cecal region. This area may represent localized stool. Given the somewhat masslike appearance in this area, it may be reasonable to consider direct visualization after appropriate colonic cleansing.  Kidney stones and hydronephrosis.  BUN 63/Cr 3.16 close  Plan: Due to patient's current renal stones and hydronephrosis, as well as slight confusion, prep for colonoscopy will be difficult.  Recommend treatment from a renal standpoint.  After this is been  completed, consider laxatives with repeat CT to determine whether or not abnormal CT is related to stool ball.  If abnormalities persist despite bowel cleanse, we will consider colonoscopy.  Continue to monitor H&H with transfusion as needed to maintain hemoglobin greater than 7.  Please recall [place] once patient is stable from a renal standpoint for further consideration of colonoscopy.  We will sign off for now.   LOS: 0 days   Salley Slaughter  PA-C 06/11/2020, 8:50 AM  Contact #  [phone]

## 2020-06-11 NOTE — Anesthesia Preprocedure Evaluation (Addendum)
Anesthesia Evaluation  Patient identified by MRN, date of birth, ID band Patient awake    Reviewed: Allergy & Precautions, NPO status , Patient's Chart, lab work & pertinent test results  History of Anesthesia Complications Negative for: history of anesthetic complications  Airway Mallampati: I  TM Distance: >3 FB Neck ROM: Full    Dental  (+) Edentulous Upper, Edentulous Lower   Pulmonary former smoker,     + decreased breath sounds      Cardiovascular hypertension, Pt. on medications +CHF  + dysrhythmias Atrial Fibrillation + pacemaker (tachy-brady syndrome)  Rhythm:Regular Rate:Normal  TTE 03/2020: EF 40%, global hypokinesis, RV systolic function moderately reduced, moderate RVE, RVSP 38.16mHg, severe LAE/RAE, s/p MV replacement, mild MR, severe TR   Neuro/Psych negative neurological ROS  negative psych ROS   GI/Hepatic GERD  Medicated,(+) Cirrhosis       ,   Endo/Other  negative endocrine ROS  Renal/GU Renal InsufficiencyRenal disease (left renal stone, Cr 3.16, K 3.5 )  negative genitourinary   Musculoskeletal negative musculoskeletal ROS (+)   Abdominal (+) + obese,   Peds  Hematology  (+) anemia , Hgb 7.9, plt 189   Anesthesia Other Findings Day of surgery medications reviewed with patient.  Reproductive/Obstetrics negative OB ROS                            Anesthesia Physical Anesthesia Plan  ASA: IV  Anesthesia Plan: MAC   Post-op Pain Management:    Induction: Intravenous  PONV Risk Score and Plan: 1 and Propofol infusion  Airway Management Planned: Natural Airway and Simple Face Mask  Additional Equipment: None  Intra-op Plan:   Post-operative Plan:   Informed Consent: I have reviewed the patients History and Physical, chart, labs and discussed the procedure including the risks, benefits and alternatives for the proposed anesthesia with the patient or authorized  representative who has indicated his/her understanding and acceptance.   Patient has DNR.  Discussed DNR with power of attorney and Suspend DNR.   Consent reviewed with POA  Plan Discussed with: CRNA  Anesthesia Plan Comments:        Anesthesia Quick Evaluation

## 2020-06-11 NOTE — Transfer of Care (Signed)
Immediate Anesthesia Transfer of Care Note  Patient: [name]  Procedure(s) Performed: Procedure(s): CYSTOSCOPY WITH STENT PLACEMENT (Left)  Patient Location: PACU  Anesthesia Type:MAC  Level of Consciousness:  sedated, patient cooperative and responds to stimulation  Airway & Oxygen Therapy:Patient Spontanous Breathing and Patient connected to face mask oxgen  Post-op Assessment:  Report given to PACU RN and Post -op Vital signs reviewed and stable  Post vital signs:  Reviewed and stable  Last Vitals:  Vitals:   06/11/20 1400 06/11/20 1430  BP: (!) 114/54 (!) 109/50  Pulse: 70 70  Resp: (!) 26 19  Temp:    SpO2: 300% 923%    Complications: No apparent anesthesia complications

## 2020-06-11 NOTE — ED Notes (Signed)
Pt. Refused purewick. RN, Claiborne Billings made aware.

## 2020-06-11 NOTE — Discharge Instructions (Addendum)
Ureteral Stent Implantation, Care After This sheet gives you information about how to care for yourself after your procedure. Your health care provider may also give you more specific instructions. If you have problems or questions, contact your health care provider. What can I expect after the procedure? After the procedure, it is common to have:  Nausea.  Mild pain when you urinate. You may feel this pain in your lower back or lower abdomen. The pain should stop within a few minutes after you urinate. This may last for up to 1 week.  A small amount of blood in your urine for several days. Follow these instructions at home: Medicines  Take over-the-counter and prescription medicines only as told by your health care provider.  If you were prescribed an antibiotic medicine, take it as told by your health care provider. Do not stop taking the antibiotic even if you start to feel better.  Do not drive for 24 hours if you were given a sedative during your procedure.  Ask your health care provider if the medicine prescribed to you requires you to avoid driving or using heavy machinery. Activity  Rest as told by your health care provider.  Avoid sitting for a long time without moving. Get up to take short walks every 1-2 hours. This is important to improve blood flow and breathing. Ask for help if you feel weak or unsteady.  Return to your normal activities as told by your health care provider. Ask your health care provider what activities are safe for you. General instructions   Watch for any blood in your urine. Call your health care provider if the amount of blood in your urine increases.  If you have a catheter: ? Follow instructions from your health care provider about taking care of your catheter and collection bag. ? Do not take baths, swim, or use a hot tub until your health care provider approves. Ask your health care provider if you may take showers. You may only be allowed to  take sponge baths.  Drink enough fluid to keep your urine pale yellow.  Do not use any products that contain nicotine or tobacco, such as cigarettes, e-cigarettes, and chewing tobacco. These can delay healing after surgery. If you need help quitting, ask your health care provider.  Keep all follow-up visits as told by your health care provider. This is important. Contact a health care provider if:  You have pain that gets worse or does not get better with medicine, especially pain when you urinate.  You have difficulty urinating.  You feel nauseous or you vomit repeatedly during a period of more than 2 days after the procedure. Get help right away if:  Your urine is dark red or has blood clots in it.  You are leaking urine (have incontinence).  The end of the stent comes out of your urethra.  You cannot urinate.  You have sudden, sharp, or severe pain in your abdomen or lower back.  You have a fever.  You have swelling or pain in your legs.  You have difficulty breathing. Summary  After the procedure, it is common to have mild pain when you urinate that goes away within a few minutes after you urinate. This may last for up to 1 week.  Watch for any blood in your urine. Call your health care provider if the amount of blood in your urine increases.  Take over-the-counter and prescription medicines only as told by your health care provider.  Drink   enough fluid to keep your urine pale yellow. This information is not intended to replace advice given to you by your health care provider. Make sure you discuss any questions you have with your health care provider. Document Revised: 08/10/2018 Document Reviewed: 08/11/2018 Elsevier Patient Education  2020 [facility], [place] [id number]  OPEN ABDOMINAL SURGERY: POST OP INSTRUCTIONS  Always review your discharge instruction sheet given to you by the facility where your surgery was  performed.  IF YOU HAVE DISABILITY OR FAMILY LEAVE FORMS, YOU MUST BRING THEM TO THE OFFICE FOR PROCESSING.  PLEASE DO NOT GIVE THEM TO YOUR DOCTOR.  1. A prescription for pain medication may be given to you upon discharge.  Take your pain medication as prescribed, if needed.  If narcotic pain medicine is not needed, then you may take acetaminophen (Tylenol) or ibuprofen (Advil) as needed. 2. Take your usually prescribed medications unless otherwise directed. 3. If you need a refill on your pain medication, please contact your pharmacy. They will contact our office to request authorization.  Prescriptions will not be filled after 5pm or on week-ends. 4. You should follow a light diet the first few days after arrival home, such as soup and crackers, pudding, etc.unless your doctor has advised otherwise. A high-fiber, low fat diet can be resumed as tolerated.   Be sure to include lots of fluids daily. Most patients will experience some swelling and bruising on the chest and neck area.  Ice packs will help.  Swelling and bruising can take several days to resolve 5. Most patients will experience some swelling and bruising in the area of the incision. Ice pack will help. Swelling and bruising can take several days to resolve..  6. It is common to experience some constipation if taking pain medication after surgery.  Increasing fluid intake and taking a stool softener will usually help or prevent this problem from occurring.  A mild laxative (Milk of Magnesia or Miralax) should be taken according to package directions if there are no bowel movements after 48 hours. 7.  You may have steri-strips (small skin tapes) in place directly over the incision.  These strips should be left on the skin for 7-10 days.  If your surgeon used skin glue on the incision, you may shower in 24 hours.  The glue will flake off over the next 2-3 weeks.  Any sutures or staples will be removed at the office during your follow-up visit.  You may find that a light gauze bandage over your incision may keep your staples from being rubbed or pulled. You may shower and replace the bandage daily. 8. ACTIVITIES:  You may resume regular (light) daily activities beginning the next day--such as daily self-care, walking, climbing stairs--gradually increasing activities as tolerated.  You may have sexual intercourse when it is comfortable.  Refrain from any heavy lifting or straining until approved by your doctor. a. You may drive when you no longer are taking prescription pain medication, you can comfortably wear a seatbelt, and you can safely maneuver your car and apply brakes b. Return to Work: ___________________________________ 10. You should see your doctor in the office for a follow-up appointment approximately two weeks after your surgery.  Make sure that you call for this appointment within a day or two after you arrive home to insure a convenient appointment time. OTHER INSTRUCTIONS:  _____________________________________________________________ _____________________________________________________________  WHEN TO CALL YOUR DOCTOR: 1. Fever over 101.0 2. Inability to  urinate 3. Nausea and/or vomiting 4. Extreme swelling or bruising 5. Continued bleeding from incision. 6. Increased pain, redness, or drainage from the incision. 7. Difficulty swallowing or breathing 8. Muscle cramping or spasms. 9. Numbness or tingling in hands or feet or around lips.  The clinic staff is available to answer your questions during regular business hours.  Please don't hesitate to call and ask to speak to one of the nurses if you have concerns.  For further questions, please visit [email or web address]    Negative Pressure Wound Therapy Home Guide Negative pressure wound therapy (NPWT) uses a sponge or foam-like material (dressing) placed on or inside the wound. The wound is then covered and sealed with a cover dressing that sticks to  your skin (is adhesive). This keeps air out. A tube is attached to the cover dressing, and this tube connects to a small pump. The pump sucks fluid and germs from the wound. NPWT helps to increase blood flow to the wound and heal it from the inside. What are the risks? NPWT is usually safe to use. However, problems can occur, including:  Skin irritation from the dressing adhesive.  Bleeding.  Infection.  Dehydration. Wounds with large amounts of drainage can cause excessive fluid loss.  Pain. Supplies needed:  A disposable garbage bag.  Soap and water, or hand sanitizer.  Wound cleanser or salt-water solution (saline).  New sponge and cover dressing.  Protective clothing.  Gauze pad.  Vinyl gloves.  Tape.  Skin protectant. This may be a wipe, film, or spray.  Clean or germ-free (sterile) scissors.  Eye protection. How to change your dressing Prepare to change your dressing  1. If told by your health care provider, take pain medicine 30 minutes before changing the dressing. 2. Wash your hands with soap and water. Dry your hands with a clean towel. If soap and water are not available, use hand sanitizer. 3. Set up a clean station for wound care. 4. Open the dressing package so that the sponge dressing remains on the inside of the package. 5. Wear gloves, protective clothing, and eye protection. Remove old dressing  1. Turn off the pump and disconnect the tubing from the dressing. 2. Carefully remove the adhesive cover dressing in the direction of your hair growth. 3. Remove the sponge dressing that is inside the wound. If the sponge sticks, use a wound cleanser or saline solution to wet the sponge and help it come off more easily. 4. Throw the old sponge and cover dressing supplies into the garbage bag. 5. Remove your gloves by grabbing the cuff and turning the glove inside out. Place the gloves in the trash immediately. 6. Wash your hands with soap and water. Dry  your hands with a clean towel. If soap and water are not available, use hand sanitizer. Clean your wound  Wear gloves, protective clothing, and eye protection. Follow your health care provider's instructions on how to clean your wound. You may be told to: 1. Clean the wound using a saline solution or a wound cleanser and a clean gauze pad. 2. Pat the wound dry with a gauze pad. Do not rub the wound. 3. Throw the gauze pad into the garbage bag. 4. Remove your gloves by grabbing the cuff and turning the glove inside out. Place the gloves in the trash immediately. 5. Wash your hands with soap and water. Dry your hands with a clean towel. If soap and water are not available, use hand sanitizer. Apply new dressing  Wear gloves, protective clothing, and eye protection. 1. If told by your health care provider, apply a skin protectant to any skin that will be exposed to adhesive. Let the skin protectant dry. 2. Cut a piece of new sponge dressing and put it on or in the wound. 3. Using clean scissors, cut a nickel-sized hole in the new cover dressing. 4. Apply the cover dressing. 5. Attach the suction tube over the hole in the cover dressing. 6. Take off your gloves. Put them in the plastic bag with the old dressing. Tie the bag shut and throw it away. 7. Wash your hands with soap and water. Dry your hands with a clean towel. If soap and water are not available, use hand sanitizer. 8. Turn the pump back on. The sponge dressing should collapse. Do not change the settings on the machine without talking to a health care provider. 9. Replace the container in the pump that collects fluid if it is full. Replace the container per the manufacturer's instructions or at least once a week, even if it is not full. General tips and recommendations If the alarm sounds:  Stay calm.  Do not turn off the pump or do anything with the dressing.  Reasons the alarm may go off: ? The battery is low. Change the  battery or plug the device into electrical power. ? The dressing has a leak. Find the leak and put tape over the leak. ? The fluid collection container is full. Change the fluid container.  Call your health care provider right away if you cannot fix the problem.  Explain to your health care provider what is happening. Follow his or her instructions. General instructions  Do not turn off the pump unless told to do so by your health care provider.  Do not turn off the pump for more than 2 hours. If the pump is off for more than 2 hours, the dressing will need to be changed.  If your health care provider says it is okay to shower: ? Do not take the pump into the shower. ? Make sure the wound dressing is protected and sealed. The wound dressing must stay dry.  Check frequently that the machine indicates that therapy is on and that all clamps are open.  Do not use over-the-counter medicated or antiseptic creams, sprays, liquids, or dressings unless your health care provider approves. Contact a health care provider if:  You have new pain.  You develop irritation, a rash, or itching around the wound or dressing.  You see new black or yellow tissue in your wound.  The dressing changes are painful or cause bleeding.  The pump has been off for more than 2 hours, and you do not know how to change the dressing.  The pump alarm goes off, and you do not know what to do. Get help right away if:  You have a lot of bleeding.  The wound breaks open.  You have severe pain.  You have signs of infection, such as: ? More redness, swelling, or pain. ? More fluid or blood. ? Warmth. ? Pus or a bad smell. ? Red streaks leading from the wound. ? A fever.  You see a sudden change in the color or texture of the drainage.  You have signs of dehydration, such as: ? Little or no tears, urine, or sweat. ? Muscle cramps. ? Very dry mouth. ? Headache. ? Dizziness. Summary  Negative pressure  wound therapy (NPWT) is a device that  helps your wound heal.  Set up a clean station for wound care. Your health care provider will tell you what supplies to use.  Follow your health care provider's instructions on how to clean your wound and how to change the dressing.  Contact a health care provider if you have new pain, an irritation, or a rash, or if the alarm goes off and you do not know what to do.  Get help right away if you have a lot of bleeding, your wound breaks open, or you have severe pain. Also, get help if you have signs of infection. This information is not intended to replace advice given to you by your health care provider. Make sure you discuss any questions you have with your health care provider. Document Revised: 02/25/2019 Document Reviewed: 01/21/2019 Elsevier Patient Education  [place].   Additional discharge instructions  Please get your medications reviewed and adjusted by your Primary MD.  Please request your Primary MD to go over all Hospital Tests and Procedure/Radiological results at the follow up, please get all Hospital records sent to your Prim MD by signing hospital release before you go home.  If you had Pneumonia of Lung problems at the Hospital: Please get a 2 view Chest X ray done in 6-8 weeks after hospital discharge or sooner if instructed by your Primary MD.  If you have Congestive Heart Failure: Please call your Cardiologist or Primary MD anytime you have any of the following symptoms:  1) 3 pound weight gain in 24 hours or 5 pounds in 1 week  2) shortness of breath, with or without a dry hacking cough  3) swelling in the hands, feet or stomach  4) if you have to sleep on extra pillows at night in order to breathe  Follow cardiac low salt diet and 1.5 lit/day fluid restriction.  If you have diabetes Accuchecks 4 times/day, Once in AM empty stomach and then before each meal. Log in all results and show them to your primary doctor  at your next visit. If any glucose reading is under 80 or above 300 call your primary MD immediately.  If you have Seizure/Convulsions/Epilepsy: Please do not drive, operate heavy machinery, participate in activities at heights or participate in high speed sports until you have seen by Primary MD or a Neurologist and advised to do so again.  If you had Gastrointestinal Bleeding: Please ask your Primary MD to check a complete blood count within one week of discharge or at your next visit. Your endoscopic/colonoscopic biopsies that are pending at the time of discharge, will also need to followed by your Primary MD.  Get Medicines reviewed and adjusted. Please take all your medications with you for your next visit with your Primary MD  Please request your Primary MD to go over all hospital tests and procedure/radiological results at the follow up, please ask your Primary MD to get all Hospital records sent to his/her office.  If you experience worsening of your admission symptoms, develop shortness of breath, life threatening emergency, suicidal or homicidal thoughts you must seek medical attention immediately by calling 911 or calling your MD immediately  if symptoms less severe.  You must read complete instructions/literature along with all the possible adverse reactions/side effects for all the Medicines you take and that have been prescribed to you. Take any new Medicines after you have completely understood and accpet all the possible adverse reactions/side effects.   Do not drive or operate heavy machinery when  taking Pain medications.   Do not take more than prescribed Pain, Sleep and Anxiety Medications  Special Instructions: If you have smoked or chewed Tobacco  in the last 2 yrs please stop smoking, stop any regular Alcohol  and or any Recreational drug use.  Wear Seat belts while driving.  Please note You were cared for by a hospitalist during your hospital stay. If you have any  questions about your discharge medications or the care you received while you were in the hospital after you are discharged, you can call the unit and asked to speak with the hospitalist on call if the hospitalist that took care of you is not available. Once you are discharged, your primary care physician will handle any further medical issues. Please note that NO REFILLS for any discharge medications will be authorized once you are discharged, as it is imperative that you return to your primary care physician (or establish a relationship with a primary care physician if you do not have one) for your aftercare needs so that they can reassess your need for medications and monitor your lab values.  You can reach the hospitalist office at phone [phone] or fax [phone]   If you do not have a primary care physician, you can call [phone] for a physician referral.

## 2020-06-11 NOTE — Consult Note (Signed)
Urology Consult   Physician requesting consult: Rolland Porter  Reason for consult: Left ureteral stones with hydro  History of Present Illness: [name] is a 86 y.o. female with PMH significant for anemia, GI bleed, chronic afib (no anticoag), CHF, CKD, and HTN who presented to the ED today with c/o lower abdominal pain and melena.  She states her discomfort has been present for several days.  CT A/P revealed 3 left ureteral stones with left hydro, bilateral intrarenal stones, and probable hyperdense cysts in the right kidney. UA nitrite negative, >50 WBCs, and many bacteria. WBC 10, Cr 3.16 (baseline 1.5).    She denies a hx of kidney stones and hematuria.  She had recurrent UTIs "years ago" but has not had any recent issues with infections. She has urinary frequency and incontinence.   [name] is currently resting in the ED.  She c/o lower abdominal pain, mostly suprapubic. She denies F/C, HA, CP, SOB, and N/V.  IM is admitting and GI has evaled for GI bleed and anemia.   Past Medical History:  Diagnosis Date  . Anemia   . Brady-tachy syndrome ([place])    a. 2003: post-op afib after MR repair then symptomatic bradycardia requiring pacemaker. b. Upgrade to [employer] Bi-V Pacemaker 2007.  . Cellulitis and abscess of foot 08/15/2015   RT FOOT  . Chronic atrial fibrillation ([place])    a. Previously failed DCCV/amio/tikosyn.  CHADS2VASC score 5 - not on anticoagulation due to history of GI bleeding.    . Chronic combined systolic and diastolic CHF (congestive heart failure) (HCC)    a. EF 35-40% on echo 09/2013 b. echo 04/2016: EF 50-55% with mild to moderate MS.  . Cirrhosis ([place])    a. Newly recognized 09/2013 - by CT.  . CKD (chronic kidney disease)   . GI bleeding   . Hypertension   . Mitral valve disorder    a. Severe MR s/p repair 2003 (28 mm annuloplasty ring and and oversew of LAA). No CAD by cath at that time.  [name] NSVT (nonsustained ventricular tachycardia) ([place])    a. Isolated event  during 09/2007 adm.  . NSVT (nonsustained ventricular tachycardia) ([place])   . Orthostatic hypotension dysautonomic syndrome ([place])   . Pulmonary hypertension (HCC)    Group II secondary to CHF and MV disease  . Spinal stenosis    shes recieved epidural steroid injections in the past  . Tricuspid regurgitation     Past Surgical History:  Procedure Laterality Date  . APPENDECTOMY    . BALLOON DILATION N/A 06/05/2020   Procedure: BALLOON DILATION;  Surgeon: Otis Brace, MD;  Location: [facility];  Service: Gastroenterology;  Laterality: N/A;  . BIOPSY  06/05/2020   Procedure: BIOPSY;  Surgeon: Otis Brace, MD;  Location: [facility];  Service: Gastroenterology;;  . BIV PACEMAKER GENERATOR CHANGE OUT N/A 12/13/2014   Procedure: BIV PACEMAKER GENERATOR CHANGE OUT;  Surgeon: Evans Lance, MD;  Location: [facility] CATH LAB;  Service: Cardiovascular;  Laterality: N/A;  . CHOLECYSTECTOMY    . ESOPHAGOGASTRODUODENOSCOPY (EGD) WITH PROPOFOL N/A 06/05/2020   Procedure: ESOPHAGOGASTRODUODENOSCOPY (EGD) WITH PROPOFOL;  Surgeon: Otis Brace, MD;  Location: [facility];  Service: Gastroenterology;  Laterality: N/A;  . IRRIGATION AND DEBRIDEMENT ABSCESS N/A 10/14/2013   Procedure: IRRIGATION AND DEBRIDEMENT PERINEAL ABSCESS;  Surgeon: Rolm Bookbinder, MD;  Location: [facility];  Service: General;  Laterality: N/A;  . VALVE REPLACEMENT     sever mitral regurgitation s/p mitral valve annuloplasty ring  Current Hospital Medications:  Home meds:  . Current Meds  Medication Sig  . acetaminophen (TYLENOL) 500 MG tablet Take 1,000 mg by mouth every 6 (six) hours as needed for mild pain.   [name] colchicine 0.6 MG tablet Take 0.6 mg by mouth as needed (Gout flare).  . Febuxostat 80 MG TABS Take 80 mg by mouth daily.  . ferrous sulfate 325 (65 FE) MG tablet Take 1 tablet (325 mg total) by mouth 2 (two) times daily with a meal.  . midodrine (PROAMATINE) 5 MG tablet Take 1 tablet (5 mg total) by  mouth 3 (three) times daily with meals.  . Multiple Vitamin (MULTIVITAMIN WITH MINERALS) TABS tablet Take 1 tablet by mouth daily.  . Multiple Vitamins-Minerals (PRESERVISION AREDS 2 PO) Take 2 tablets by mouth daily.  . pantoprazole (PROTONIX) 40 MG tablet Take 40 mg by mouth every morning.  . potassium chloride SA (KLOR-CON) 20 MEQ tablet Take 1 tablet (20 mEq total) by mouth daily.  [name] torsemide (DEMADEX) 20 MG tablet Take 1 tablet (20 mg total) by mouth daily.    Scheduled Meds: . [START ON 06/12/2020] ferrous sulfate  325 mg Oral Q breakfast  . midodrine  5 mg Oral TID WC  . [START ON 06/14/2020] pantoprazole  40 mg Intravenous Q12H  . sodium chloride flush  3 mL Intravenous Q12H   Continuous Infusions: . cefTRIAXone (ROCEPHIN)  IV    . lactated ringers 50 mL/hr at 06/11/20 1207   PRN Meds:.acetaminophen **OR** acetaminophen, acetaminophen, HYDROmorphone (DILAUDID) injection, metoprolol tartrate, oxyCODONE  Allergies: No Known Allergies  Family History  Problem Relation Age of Onset  . Pancreatic cancer Father   . Other Mother        Mother died at 14 with no real medical problems    Social History:  reports that she quit smoking about 48 years ago. She has never used smokeless tobacco. She reports current alcohol use. She reports that she does not use drugs.  ROS: A complete review of systems was performed.  All systems are negative except for pertinent findings as noted.  Physical Exam:  Vital signs in last 24 hours: Temp:  [97.5 F (36.4 C)-97.9 F (36.6 C)] 97.6 F (36.4 C) (07/26 0840) Pulse Rate:  [68-77] 70 (07/26 1430) Resp:  [19-31] 19 (07/26 1430) BP: (92-114)/(42-60) 109/50 (07/26 1430) SpO2:  [96 %-100 %] 100 % (07/26 1430) Weight:  [88 kg] 88 kg (07/26 0103) Constitutional:  Alert and oriented, No acute distress Cardiovascular: Irr irr  Respiratory: Normal respiratory effort GI: Abdomen is soft, nondistended, no abdominal masses; mild suprapubic  tenderness GU: No CVA tenderness Lymphatic: No lymphadenopathy Neurologic: Grossly intact, no focal deficits Psychiatric: Normal mood and affect  Laboratory Data:  Recent Labs    06/11/20 0123  WBC 10.7*  HGB 7.9*  HCT 27.9*  PLT 189    Recent Labs    06/11/20 0123  NA 144  K 3.5  CL 115*  GLUCOSE 118*  BUN 63*  CALCIUM 7.6*  CREATININE 3.16*     Results for orders placed or performed during the hospital encounter of 06/11/20 (from the past 24 hour(s))  Urinalysis, Routine w reflex microscopic     Status: Abnormal   Collection Time: 06/11/20  1:23 AM  Result Value Ref Range   Color, Urine YELLOW YELLOW   APPearance CLEAR CLEAR   Specific Gravity, Urine 1.010 1.005 - 1.030   pH 5.0 5.0 - 8.0   Glucose, UA NEGATIVE NEGATIVE mg/dL  Hgb urine dipstick MODERATE (A) NEGATIVE   Bilirubin Urine NEGATIVE NEGATIVE   Ketones, ur NEGATIVE NEGATIVE mg/dL   Protein, ur 30 (A) NEGATIVE mg/dL   Nitrite NEGATIVE NEGATIVE   Leukocytes,Ua MODERATE (A) NEGATIVE   RBC / HPF 0-5 0 - 5 RBC/hpf   WBC, UA >50 (H) 0 - 5 WBC/hpf   Bacteria, UA MANY (A) NONE SEEN  Comprehensive metabolic panel     Status: Abnormal   Collection Time: 06/11/20  1:23 AM  Result Value Ref Range   Sodium 144 135 - 145 mmol/L   Potassium 3.5 3.5 - 5.1 mmol/L   Chloride 115 (H) 98 - 111 mmol/L   CO2 18 (L) 22 - 32 mmol/L   Glucose, Bld 118 (H) 70 - 99 mg/dL   BUN 63 (H) 8 - 23 mg/dL   Creatinine, Ser 3.16 (H) 0.44 - 1.00 mg/dL   Calcium 7.6 (L) 8.9 - 10.3 mg/dL   Total Protein 5.7 (L) 6.5 - 8.1 g/dL   Albumin 2.4 (L) 3.5 - 5.0 g/dL   AST 22 15 - 41 U/L   ALT 17 0 - 44 U/L   Alkaline Phosphatase 87 38 - 126 U/L   Total Bilirubin 0.6 0.3 - 1.2 mg/dL   GFR calc non Af Amer 12 (L) >60 mL/min   GFR calc Af Amer 14 (L) >60 mL/min   Anion gap 11 5 - 15  CBC with Differential     Status: Abnormal   Collection Time: 06/11/20  1:23 AM  Result Value Ref Range   WBC 10.7 (H) 4.0 - 10.5 K/uL   RBC 3.22 (L)  3.87 - 5.11 MIL/uL   Hemoglobin 7.9 (L) 12.0 - 15.0 g/dL   HCT 27.9 (L) 36 - 46 %   MCV 86.6 80.0 - 100.0 fL   MCH 24.5 (L) 26.0 - 34.0 pg   MCHC 28.3 (L) 30.0 - 36.0 g/dL   RDW 17.5 (H) 11.5 - 15.5 %   Platelets 189 150 - 400 K/uL   nRBC 0.0 0.0 - 0.2 %   Neutrophils Relative % 68 %   Neutro Abs 7.3 1.7 - 7.7 K/uL   Lymphocytes Relative 17 %   Lymphs Abs 1.8 0.7 - 4.0 K/uL   Monocytes Relative 12 %   Monocytes Absolute 1.3 (H) 0 - 1 K/uL   Eosinophils Relative 2 %   Eosinophils Absolute 0.2 0 - 0 K/uL   Basophils Relative 0 %   Basophils Absolute 0.0 0 - 0 K/uL   Immature Granulocytes 1 %   Abs Immature Granulocytes 0.08 (H) 0.00 - 0.07 K/uL  Lipase, blood     Status: None   Collection Time: 06/11/20  1:23 AM  Result Value Ref Range   Lipase 27 11 - 51 U/L  Ammonia     Status: Abnormal   Collection Time: 06/11/20  1:23 AM  Result Value Ref Range   Ammonia 43 (H) 9 - 35 umol/L  Brain natriuretic peptide     Status: Abnormal   Collection Time: 06/11/20  1:23 AM  Result Value Ref Range   B Natriuretic Peptide 421.6 (H) 0.0 - 100.0 pg/mL  SARS Coronavirus 2 by RT PCR (hospital order, performed in [place] hospital lab) Nasopharyngeal Nasopharyngeal Swab     Status: None   Collection Time: 06/11/20  4:25 AM   Specimen: Nasopharyngeal Swab  Result Value Ref Range   SARS Coronavirus 2 NEGATIVE NEGATIVE  Type and screen [place]  Status: None (Preliminary result)   Collection Time: 06/11/20  4:25 AM  Result Value Ref Range   ABO/RH(D) A POS    Antibody Screen NEG    Sample Expiration 06/14/2020,2359    Unit Number [id number]    Blood Component Type RED CELLS,LR    Unit division 00    Status of Unit ISSUED    Transfusion Status OK TO TRANSFUSE    Crossmatch Result      Compatible Performed at [facility] [address], [place], [place] [address]   Prepare RBC (crossmatch)     Status: None   Collection Time: 06/11/20   4:25 AM  Result Value Ref Range   Order Confirmation      ORDER PROCESSED BY BLOOD BANK Performed at [facility] [address], [place], [place] [address]   POC occult blood, ED RN will collect     Status: Abnormal   Collection Time: 06/11/20  4:42 AM  Result Value Ref Range   Fecal Occult Bld POSITIVE (A) NEGATIVE   Recent Results (from the past 240 hour(s))  SARS Coronavirus 2 by RT PCR (hospital order, performed in [facility] hospital lab) Nasopharyngeal Nasopharyngeal Swab     Status: None   Collection Time: 06/11/20  4:25 AM   Specimen: Nasopharyngeal Swab  Result Value Ref Range Status   SARS Coronavirus 2 NEGATIVE NEGATIVE Final    Comment: (NOTE) SARS-CoV-2 target nucleic acids are NOT DETECTED.  The SARS-CoV-2 RNA is generally detectable in upper and lower respiratory specimens during the acute phase of infection. The lowest concentration of SARS-CoV-2 viral copies this assay can detect is 250 copies / mL. A negative result does not preclude SARS-CoV-2 infection and should not be used as the sole basis for treatment or other patient management decisions.  A negative result may occur with improper specimen collection / handling, submission of specimen other than nasopharyngeal swab, presence of viral mutation(s) within the areas targeted by this assay, and inadequate number of viral copies (<250 copies / mL). A negative result must be combined with clinical observations, patient history, and epidemiological information.  Fact Sheet for Patients:   StrictlyIdeas.no  Fact Sheet for Healthcare Providers: BankingDealers.co.za  This test is not yet approved or  cleared by the [place] FDA and has been authorized for detection and/or diagnosis of SARS-CoV-2 by FDA under an Emergency Use Authorization (EUA).  This EUA will remain in effect (meaning this test can be used) for the duration of  the COVID-19 declaration under Section 564(b)(1) of the Act, 21 U.S.C. section 360bbb-3(b)(1), unless the authorization is terminated or revoked sooner.  Performed at [facility], [place] [address], [place], [place] [address]     Renal Function: Recent Labs    06/11/20 0123  CREATININE 3.16*   Estimated Creatinine Clearance: 12 mL/min (A) (by C-G formula based on SCr of 3.16 mg/dL (H)).  Radiologic Imaging: CT ABDOMEN PELVIS WO CONTRAST  Result Date: 06/11/2020 CLINICAL DATA:  Abdominal pain with fever EXAM: CT ABDOMEN AND PELVIS WITHOUT CONTRAST TECHNIQUE: Multidetector CT imaging of the abdomen and pelvis was performed following the standard protocol without IV contrast. Oral contrast was administered. COMPARISON:  October 10, 2013 FINDINGS: Lower chest: There is bibasilar atelectasis. Pacemaker leads are attached to the right atrium and right ventricle. Evidence of prosthetic mitral valve. Hepatobiliary: The liver contour is subtly nodular, a finding also noted previously. No focal liver lesions are appreciable on this noncontrast enhanced study.  Gallbladder absent. There is no appreciable biliary duct dilatation. Pancreas: There is no pancreatic mass or inflammatory focus. Spleen: There is a cyst arising from the spleen measuring 3.4 x 3.2 cm. 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 Adrenals/Urinary Tract: Adrenals bilaterally appear unremarkable. There are suspected hyperdense cysts in the right kidney, largest measuring 9 x 9 mm. There is a 1.5 x 1.2 cm cyst arising from the upper pole of the left kidney. There is moderate hydronephrosis on the left. There is no hydronephrosis on the right. There is a 7 x 3 mm calculus in the lower pole of the left kidney. There is a 2 mm calculus with a nearby 1 mm calculus in the lower pole of the right kidney. There is a 1 mm calculus in the mid right kidney. There is a calculus in the proximal left  ureter at the L4 level measuring 7 x 5 mm with an adjacent 2 mm calculus in this region. At the upper sacral level, there is an additional calculus in the left ureter measuring 7 x 4 mm. No ureteral calculus evident on the right. Urinary bladder is midline with wall thickness within normal limits. Stomach/Bowel: There are sigmoid diverticula without diverticulitis. There is a soft tissue masslike area in the cecum measuring 4.9 x 4.2 cm which has attenuation value slightly higher than is expected with serous fluid. There is no appreciable bowel wall or mesenteric thickening. There is no evident bowel obstruction. There is no free air or portal venous air evident. Vascular/Lymphatic: There is aortic and iliac artery atherosclerosis. No aneurysm is evident in the aorta. There is no appreciable adenopathy in the abdomen or pelvis. Reproductive: The uterus is anteverted. There are several intrauterine calcifications, likely due to leiomyomatous change. No extrauterine pelvic mass evident. Other: The appendix is absent. No periappendiceal region inflammation. No abscess or ascites is evident in the abdomen or pelvis. There is mesenteric thickening in the mid abdomen without bowel or vascular distortion. Musculoskeletal: Bones are osteoporotic. There is stable anterior wedging at L4. There is a degree of spinal stenosis at L2-3 and L3-4 due to bony hypertrophy. No blastic or lytic lesions are evident. There are no intramuscular lesions. IMPRESSION: 1. Moderate hydronephrosis on the left. There are calculi in the mid ureteral region on the left. A calculus at the level of L4 measures 7 x 5 mm with an adjacent 2 mm calculus. There is a 7 x 4 mm calculus at the upper sacral level on the left as well. 2. Intrarenal calculi bilaterally. Probable hyperdense cysts in right kidney. 3. Soft tissue fullness in the cecal region. This area may represent localized stool. Given the somewhat masslike appearance in this area, it may be  reasonable to consider direct visualization after appropriate colonic cleansing. 4. 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 5.  Apparent degree of hepatic cirrhosis. 7.  Gallbladder absent. 8.  Aortic Atherosclerosis (ICD10-I70.0). 9.  Suspected small calcified uterine leiomyomas. 10. Degree of spinal stenosis at L2-3 and L3-4 due to bony hypertrophy. Electronically Signed   By: Lowella Grip III M.D.   On: 06/11/2020 05:08   DG Chest Port 1 View  Result Date: 06/11/2020 CLINICAL DATA:  Shortness of breath and fevers EXAM: PORTABLE CHEST 1 VIEW COMPARISON:  04/04/2020 FINDINGS: Cardiac shadow is enlarged. Postsurgical changes are again seen. Pacemaker is again noted and stable. Chronic blunting of left costophrenic angle is noted. No focal infiltrate  is seen. No bony abnormality is noted. IMPRESSION: Chronic changes without acute abnormality. Electronically Signed   By: Inez Catalina M.D.   On: 06/11/2020 01:42    Impression/Recommendation:  Left ureteral stones with hydronephrosis--Dr. Jeffie Pollock will take pt to OR later this afternoon for cysto with ureteral stent placement.   Bilateral intrarenal stones--no intervention necessary.  Monitor.  Right renal cyst--no intervention. Monitor.  Possible UTI--urine consistent with ureteral stones, however, UTI cannot be ruled out until culture available for review. Pt given a dose of Rocephin in ED.  F/u culture and treat based on results.   AKI on CKD--Cr should improve after ureteral stent placement. Monitor closely.   Debbrah Alar 06/11/2020, 3:46 PM

## 2020-06-11 NOTE — Progress Notes (Signed)
Called patient's daughter, Santiago Glad, to inform her that patient has just left for surgery.

## 2020-06-12 ENCOUNTER — Encounter (HOSPITAL_COMMUNITY): Payer: Self-pay | Admitting: Urology

## 2020-06-12 DIAGNOSIS — N201 Calculus of ureter: Secondary | ICD-10-CM

## 2020-06-12 DIAGNOSIS — N390 Urinary tract infection, site not specified: Secondary | ICD-10-CM

## 2020-06-12 DIAGNOSIS — N1330 Unspecified hydronephrosis: Secondary | ICD-10-CM | POA: Diagnosis present

## 2020-06-12 DIAGNOSIS — N189 Chronic kidney disease, unspecified: Secondary | ICD-10-CM

## 2020-06-12 DIAGNOSIS — R410 Disorientation, unspecified: Secondary | ICD-10-CM

## 2020-06-12 DIAGNOSIS — N1831 Chronic kidney disease, stage 3a: Secondary | ICD-10-CM

## 2020-06-12 DIAGNOSIS — I5032 Chronic diastolic (congestive) heart failure: Secondary | ICD-10-CM

## 2020-06-12 DIAGNOSIS — G903 Multi-system degeneration of the autonomic nervous system: Secondary | ICD-10-CM

## 2020-06-12 DIAGNOSIS — N289 Disorder of kidney and ureter, unspecified: Secondary | ICD-10-CM

## 2020-06-12 DIAGNOSIS — D509 Iron deficiency anemia, unspecified: Secondary | ICD-10-CM

## 2020-06-12 DIAGNOSIS — A419 Sepsis, unspecified organism: Secondary | ICD-10-CM | POA: Diagnosis present

## 2020-06-12 DIAGNOSIS — K7460 Unspecified cirrhosis of liver: Secondary | ICD-10-CM

## 2020-06-12 HISTORY — DX: Unspecified hydronephrosis: N13.30

## 2020-06-12 HISTORY — DX: Sepsis, unspecified organism: N39.0

## 2020-06-12 HISTORY — DX: Sepsis, unspecified organism: A41.9

## 2020-06-12 HISTORY — DX: Calculus of ureter: N20.1

## 2020-06-12 LAB — CBC
HCT: 29.6 % — ABNORMAL LOW (ref 36.0–46.0)
Hemoglobin: 8.6 g/dL — ABNORMAL LOW (ref 12.0–15.0)
MCH: 25.2 pg — ABNORMAL LOW (ref 26.0–34.0)
MCHC: 29.1 g/dL — ABNORMAL LOW (ref 30.0–36.0)
MCV: 86.8 fL (ref 80.0–100.0)
Platelets: 174 10*3/uL (ref 150–400)
RBC: 3.41 MIL/uL — ABNORMAL LOW (ref 3.87–5.11)
RDW: 17.2 % — ABNORMAL HIGH (ref 11.5–15.5)
WBC: 8 10*3/uL (ref 4.0–10.5)
nRBC: 0 % (ref 0.0–0.2)

## 2020-06-12 LAB — TYPE AND SCREEN
ABO/RH(D): A POS
Antibody Screen: NEGATIVE
Unit division: 0

## 2020-06-12 LAB — BPAM RBC
Blood Product Expiration Date: 202108172359
ISSUE DATE / TIME: 202107260632
Unit Type and Rh: 6200

## 2020-06-12 LAB — BASIC METABOLIC PANEL
Anion gap: 14 (ref 5–15)
BUN: 56 mg/dL — ABNORMAL HIGH (ref 8–23)
CO2: 18 mmol/L — ABNORMAL LOW (ref 22–32)
Calcium: 7.5 mg/dL — ABNORMAL LOW (ref 8.9–10.3)
Chloride: 113 mmol/L — ABNORMAL HIGH (ref 98–111)
Creatinine, Ser: 2.69 mg/dL — ABNORMAL HIGH (ref 0.44–1.00)
GFR calc Af Amer: 17 mL/min — ABNORMAL LOW (ref >60)
GFR calc non Af Amer: 15 mL/min — ABNORMAL LOW (ref >60)
Glucose, Bld: 73 mg/dL (ref 70–99)
Potassium: 3.7 mmol/L (ref 3.5–5.1)
Sodium: 145 mmol/L (ref 135–145)

## 2020-06-12 LAB — URINE CULTURE: Culture: >=100000 — AB

## 2020-06-12 MED ORDER — POLYETHYLENE GLYCOL 3350 17 G PO PACK
17.0000 g | PACK | Freq: Two times a day (BID) | ORAL | Status: DC
  Administered 2020-06-12 – 2020-06-14 (×5): 17 g via ORAL
  Filled 2020-06-12 (×6): qty 1

## 2020-06-12 MED ORDER — SENNOSIDES-DOCUSATE SODIUM 8.6-50 MG PO TABS
2.0000 | ORAL_TABLET | Freq: Two times a day (BID) | ORAL | Status: DC
  Administered 2020-06-12 – 2020-06-22 (×12): 2 via ORAL
  Filled 2020-06-12 (×15): qty 2

## 2020-06-12 NOTE — Progress Notes (Signed)
[facility]  PROGRESS NOTE  [name] VEH:[id number] DOB: August 13, 1926 DOA: 06/11/2020 PCP: [name], MD Admit date - 06/11/2020   Admitting Physician Harold Hedge, MD  Outpatient Primary MD for the patient is [name], MD  LOS - 1 Brief Narrative   [name] is a 86 y.o. year old female with medical history significant for tachybradycardia syndrome s/p BiV pacemaker in 2007, severe MR s/p repair, chronic atrial fibrillation not on anticoagulation due to GI bleed, abnormal esophageal motility consistent with presbyesophagus, gastritis noted on recent endoscopy on July 20 with balloon dilatation with [name], group 2 pulmonary hypertension, gout who presented on 06/11/2020 with 2 days of dysuria, odor to urine, urine discoloration with abdominal pain as well as reported black stools for several weeks and increasing confusion and was found to have sepsis secondary to UTI in setting of obstructive uropathy and acute on chronic anemia.  In the ED patient was afebrile, tachypneic, with BP of 92/45.  Lab work notable for hemoglobin of 8 (previous previous baseline of 9.3), fecal occult positive, Covid test negative.  UA with moderate leuks, many bacteria, BUN 63, creatinine 3.16, CO2 18, WBC 10.7, ammonia 43, BNP 421.6.  Chest x-ray showed chronic blunting of left costophrenic angle with no acute abnormalities. CT abdomen/pelvis show bibasilar atelectasis, left-sided moderate hydronephrosis, intrarenal calculi bilaterally, left ureteral calculi, and soft tissue fullness in the cecal region (that may represent localized stool). Patient underwent ureteral stent placement by urology on 7/26.  Patient was started on IV ceftriaxone and admitted under [facility] service.      Subjective  Today states pain is greatly improved, denies any fevers or chills  A & P  Sepsis secondary to UTI in the setting of obstructive uropathy, resolved.  Presented with tachypnea, tachycardia,  leukocytosis, UA concerning for infection in setting of left ureteral stone.  Resolved with ureteral stent placement initiation of IV ceftriaxone -Monitor urine cultures -Continue IV ceftriaxone  Obstructive uropathy with left-sided moderate hydronephrosis.  Status post left ureteral stent placement by urology on 7/26.  Having normal urine output -Okay to discontinue Foley catheter per urology -Continue ceftriaxone, monitor urine/blood cultures  AKI on CKDStage III.  Likely prerenal/ATN/possibly related to obstructive uropathy, in the setting of sepsis and relative hypotension on admission.  Previous baseline creatinine 1.5-1.8.  Peak creatinine 3.16, already improving -Avoid nephrotoxins -Monitor output -Monitor BMP  Acute on chronic anemia (iron def anemia) with chronic melena.  Patient on oral iron, no bright red blood.  Last EGD on 06/05/2020 which showed gastritis and presbyesophagus.  GI consulted on admission due to change in hemoglobin (baseline hemoglobin around 9,  7.9 on admission).  Status post 1 unit of blood on admission.  Currently no obvious peripheral edema no appreciable JVD on exam -We will hold off on further diuresis (holding home torsemide) and monitor volume status daily -Need Stabilization from a renal point/UTI, and use laxatives to optimize bowel regimen -Repeat CT abdomen to assess abnormal CT on admission in case related to stool ball once having BM -If abnormalities persist despite bowel cleanse may consider colonoscopy -GI recommends reconsulting once patient stable from a renal standpoint for further consideration of colonoscopy -Continue home iron -IV Protonix twice daily -Hemoglobin monitoring CBC -Transfusion if hemoglobin less than 7  Soft tissue fullness in cecal region.  Found on CT abdomen.  Suspect likely stool. -Optimize bowel regimen, twice daily MiraLAX, 2 tabs twice daily senna docusate output -Once pathology not mass, repeat CT  abdomen to assess,  pending results may warrant reconsultation of GI  Combined systolic and diastolic heart failure, mild exacerbation.TTE 03/2020 with EF of 40%.  Elevated BNP on admission with some peripheral demyelination concerning for mild exacerbation.  Patient status post low-dose Lasix after blood transfusion and admission -Low-sodium diet, fluid restrict 1800 cc -Strict I's and O's, daily weights  Chronic hypotension, stable -Continue home midodrine     Family Communication  : Daughter updated on patient's phone on 7/27  Code Status : DNR, discussed on day of admission  Disposition Plan  :  Patient is from ALF. Anticipated d/c date: 2 to 3 days. Barriers to d/c or necessity for inpatient status:  IV ceftriaxone, monitoring renal function, acute worsening of chronic anemia work-up Consults  : GI, urology  Procedures  : Left ureteral stent placement 7/26  DVT Prophylaxis  : SCDs  Lab Results  Component Value Date   PLT 174 06/12/2020    Diet :  Diet Order            Diet regular Room service appropriate? Yes; Fluid consistency: Thin  Diet effective now                  Inpatient Medications Scheduled Meds: . Chlorhexidine Gluconate Cloth  6 each Topical Daily  . ferrous sulfate  325 mg Oral Q breakfast  . midodrine  5 mg Oral TID WC  . [START ON 06/14/2020] pantoprazole  40 mg Intravenous Q12H  . polyethylene glycol  17 g Oral BID  . senna-docusate  2 tablet Oral BID  . sodium chloride flush  3 mL Intravenous Q12H   Continuous Infusions: . cefTRIAXone (ROCEPHIN)  IV 1 g (06/12/20 0934)   PRN Meds:.acetaminophen **OR** acetaminophen, acetaminophen, HYDROmorphone (DILAUDID) injection, metoprolol tartrate, oxyCODONE  Antibiotics  :   Anti-infectives (From admission, onward)   Start     Dose/Rate Route Frequency Ordered Stop   06/11/20 1030  cefTRIAXone (ROCEPHIN) 1 g in sodium chloride 0.9 % 100 mL IVPB     Discontinue     1 g 200 mL/hr over 30 Minutes Intravenous Every 24  hours 06/11/20 1021     06/11/20 0300  cefTRIAXone (ROCEPHIN) 1 g in sodium chloride 0.9 % 100 mL IVPB        1 g 200 mL/hr over 30 Minutes Intravenous  Once 06/11/20 0259 06/11/20 0458       Objective   Vitals:   06/12/20 0648 06/12/20 0945 06/12/20 1359 06/12/20 2022  BP: (!) 102/60 (!) 113/44 (!) 139/51 (!) 102/40  Pulse: 70 66 73 69  Resp: 20 18 22 20   Temp: (!) 97.4 F (36.3 C) 97.7 F (36.5 C) 97.6 F (36.4 C) 98 F (36.7 C)  TempSrc: Oral Oral Oral Oral  SpO2: 99% 100% 100% 96%  Weight:      Height:        SpO2: 96 % O2 Flow Rate (L/min): 2 L/min  Wt Readings from Last 3 Encounters:  06/11/20 89.8 kg  06/05/20 86.2 kg  04/11/20 83.5 kg     Intake/Output Summary (Last 24 hours) at 06/12/2020 2127 Last data filed at 06/12/2020 1845 Gross per 24 hour  Intake 695.25 ml  Output 1600 ml  Net -904.75 ml    Physical Exam:  Awake, sitting in bedside chair, flat affect, oriented to self, place, time, occasionally confused Following simple commands, no focal deficits Normal respiratory effort on room air, clear lungs Regular rate and rhythm Abdomen soft, nondistended,  slightly tender in suprapubic area No new rashes or bruises      I have personally reviewed the following:   Data Reviewed:  CBC Recent Labs  Lab 06/11/20 0123 06/11/20 1514 06/11/20 1929 06/12/20 0512  WBC 10.7*  --   --  8.0  HGB 7.9* 8.0* 7.9* 8.6*  HCT 27.9* 28.0* 27.3* 29.6*  PLT 189  --   --  174  MCV 86.6  --   --  86.8  MCH 24.5*  --   --  25.2*  MCHC 28.3*  --   --  29.1*  RDW 17.5*  --   --  17.2*  LYMPHSABS 1.8  --   --   --   MONOABS 1.3*  --   --   --   EOSABS 0.2  --   --   --   BASOSABS 0.0  --   --   --     Chemistries  Recent Labs  Lab 06/11/20 0123 06/12/20 0512  NA 144 145  K 3.5 3.7  CL 115* 113*  CO2 18* 18*  GLUCOSE 118* 73  BUN 63* 56*  CREATININE 3.16* 2.69*  CALCIUM 7.6* 7.5*  AST 22  --   ALT 17  --   ALKPHOS 87  --   BILITOT 0.6  --      ------------------------------------------------------------------------------------------------------------------ No results for input(s): CHOL, HDL, LDLCALC, TRIG, CHOLHDL, LDLDIRECT in the last 72 hours.  No results found for: HGBA1C ------------------------------------------------------------------------------------------------------------------ No results for input(s): TSH, T4TOTAL, T3FREE, THYROIDAB in the last 72 hours.  Invalid input(s): FREET3 ------------------------------------------------------------------------------------------------------------------ No results for input(s): VITAMINB12, FOLATE, FERRITIN, TIBC, IRON, RETICCTPCT in the last 72 hours.  Coagulation profile No results for input(s): INR, PROTIME in the last 168 hours.  No results for input(s): DDIMER in the last 72 hours.  Cardiac Enzymes No results for input(s): CKMB, TROPONINI, MYOGLOBIN in the last 168 hours.  Invalid input(s): CK ------------------------------------------------------------------------------------------------------------------    Component Value Date/Time   BNP 421.6 (H) 06/11/2020 0123    Micro Results Recent Results (from the past 240 hour(s))  Urine culture     Status: Abnormal   Collection Time: 06/11/20  1:23 AM   Specimen: Urine, Catheterized  Result Value Ref Range Status   Specimen Description   Final    URINE, CATHETERIZED Performed at [facility], [place] [address] [place], [place] [address]    Special Requests   Final    NONE Performed at [facility], [place] [address] [place], [place] [address]    Culture (A)  Final    >=100,000 COLONIES/mL LACTOBACILLUS SPECIES Standardized susceptibility testing for this organism is not available. Performed at [facility], [place] [address] [place], [place] [address]    Report Status 06/12/2020 FINAL  Final  SARS Coronavirus 2 by RT PCR (hospital order, performed in [facility]  hospital lab) Nasopharyngeal Nasopharyngeal Swab     Status: None   Collection Time: 06/11/20  4:25 AM   Specimen: Nasopharyngeal Swab  Result Value Ref Range Status   SARS Coronavirus 2 NEGATIVE NEGATIVE Final    Comment: (NOTE) SARS-CoV-2 target nucleic acids are NOT DETECTED.  The SARS-CoV-2 RNA is generally detectable in upper and lower respiratory specimens during the acute phase of infection. The lowest concentration of SARS-CoV-2 viral copies this assay can detect is 250 copies / mL. A negative result does not preclude SARS-CoV-2 infection and should not be used as the sole basis for treatment or  other patient management decisions.  A negative result may occur with improper specimen collection / handling, submission of specimen other than nasopharyngeal swab, presence of viral mutation(s) within the areas targeted by this assay, and inadequate number of viral copies (<250 copies / mL). A negative result must be combined with clinical observations, patient history, and epidemiological information.  Fact Sheet for Patients:   StrictlyIdeas.no  Fact Sheet for Healthcare Providers: BankingDealers.co.za  This test is not yet approved or  cleared by the [place] FDA and has been authorized for detection and/or diagnosis of SARS-CoV-2 by FDA under an Emergency Use Authorization (EUA).  This EUA will remain in effect (meaning this test can be used) for the duration of the COVID-19 declaration under Section 564(b)(1) of the Act, 21 U.S.C. section 360bbb-3(b)(1), unless the authorization is terminated or revoked sooner.  Performed at [facility], [place] [address], [place], [place] [address]     Radiology Reports CT ABDOMEN PELVIS WO CONTRAST  Result Date: 06/11/2020 CLINICAL DATA:  Abdominal pain with fever EXAM: CT ABDOMEN AND PELVIS WITHOUT CONTRAST TECHNIQUE: Multidetector CT imaging of the abdomen and  pelvis was performed following the standard protocol without IV contrast. Oral contrast was administered. COMPARISON:  October 10, 2013 FINDINGS: Lower chest: There is bibasilar atelectasis. Pacemaker leads are attached to the right atrium and right ventricle. Evidence of prosthetic mitral valve. Hepatobiliary: The liver contour is subtly nodular, a finding also noted previously. No focal liver lesions are appreciable on this noncontrast enhanced study. Gallbladder absent. There is no appreciable biliary duct dilatation. Pancreas: There is no pancreatic mass or inflammatory focus. Spleen: There is a cyst arising from the spleen measuring 3.4 x 3.2 cm. 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 Adrenals/Urinary Tract: Adrenals bilaterally appear unremarkable. There are suspected hyperdense cysts in the right kidney, largest measuring 9 x 9 mm. There is a 1.5 x 1.2 cm cyst arising from the upper pole of the left kidney. There is moderate hydronephrosis on the left. There is no hydronephrosis on the right. There is a 7 x 3 mm calculus in the lower pole of the left kidney. There is a 2 mm calculus with a nearby 1 mm calculus in the lower pole of the right kidney. There is a 1 mm calculus in the mid right kidney. There is a calculus in the proximal left ureter at the L4 level measuring 7 x 5 mm with an adjacent 2 mm calculus in this region. At the upper sacral level, there is an additional calculus in the left ureter measuring 7 x 4 mm. No ureteral calculus evident on the right. Urinary bladder is midline with wall thickness within normal limits. Stomach/Bowel: There are sigmoid diverticula without diverticulitis. There is a soft tissue masslike area in the cecum measuring 4.9 x 4.2 cm which has attenuation value slightly higher than is expected with serous fluid. There is no appreciable bowel wall or mesenteric thickening. There is no evident bowel obstruction. There is no free air or  portal venous air evident. Vascular/Lymphatic: There is aortic and iliac artery atherosclerosis. No aneurysm is evident in the aorta. There is no appreciable adenopathy in the abdomen or pelvis. Reproductive: The uterus is anteverted. There are several intrauterine calcifications, likely due to leiomyomatous change. No extrauterine pelvic mass evident. Other: The appendix is absent. No periappendiceal region inflammation. No abscess or ascites is evident in the abdomen or pelvis. There is mesenteric thickening in the mid  abdomen without bowel or vascular distortion. Musculoskeletal: Bones are osteoporotic. There is stable anterior wedging at L4. There is a degree of spinal stenosis at L2-3 and L3-4 due to bony hypertrophy. No blastic or lytic lesions are evident. There are no intramuscular lesions. IMPRESSION: 1. Moderate hydronephrosis on the left. There are calculi in the mid ureteral region on the left. A calculus at the level of L4 measures 7 x 5 mm with an adjacent 2 mm calculus. There is a 7 x 4 mm calculus at the upper sacral level on the left as well. 2. Intrarenal calculi bilaterally. Probable hyperdense cysts in right kidney. 3. Soft tissue fullness in the cecal region. This area may represent localized stool. Given the somewhat masslike appearance in this area, it may be reasonable to consider direct visualization after appropriate colonic cleansing. 4. 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 5.  Apparent degree of hepatic cirrhosis. 7.  Gallbladder absent. 8.  Aortic Atherosclerosis (ICD10-I70.0). 9.  Suspected small calcified uterine leiomyomas. 10. Degree of spinal stenosis at L2-3 and L3-4 due to bony hypertrophy. Electronically Signed   By: Lowella Grip III M.D.   On: 06/11/2020 05:08   DG Chest Port 1 View  Result Date: 06/11/2020 CLINICAL DATA:  Shortness of breath and fevers EXAM: PORTABLE CHEST 1 VIEW COMPARISON:  04/04/2020 FINDINGS:  Cardiac shadow is enlarged. Postsurgical changes are again seen. Pacemaker is again noted and stable. Chronic blunting of left costophrenic angle is noted. No focal infiltrate is seen. No bony abnormality is noted. IMPRESSION: Chronic changes without acute abnormality. Electronically Signed   By: Inez Catalina M.D.   On: 06/11/2020 01:42   DG C-Arm 1-60 Min-No Report  Result Date: 06/11/2020 Fluoroscopy was utilized by the requesting physician.  No radiographic interpretation.     Time Spent in minutes  30     Desiree Hane M.D on 06/12/2020 at 9:27 PM  To page go to [email or web address] - password [facility]

## 2020-06-12 NOTE — Progress Notes (Signed)
1 Day Post-Op  Subjective: [name] is doing well s/p stenting.  She has reduced pain and no fever.  She has good UOP and her Cr and WBC are declining. Cultures are pending.    ROS:  Review of Systems  All other systems reviewed and are negative.   Anti-infectives: Anti-infectives (From admission, onward)   Start     Dose/Rate Route Frequency Ordered Stop   06/11/20 1030  cefTRIAXone (ROCEPHIN) 1 g in sodium chloride 0.9 % 100 mL IVPB     Discontinue     1 g 200 mL/hr over 30 Minutes Intravenous Every 24 hours 06/11/20 1021     06/11/20 0300  cefTRIAXone (ROCEPHIN) 1 g in sodium chloride 0.9 % 100 mL IVPB        1 g 200 mL/hr over 30 Minutes Intravenous  Once 06/11/20 0259 06/11/20 0458      Current Facility-Administered Medications  Medication Dose Route Frequency Provider Last Rate Last Admin  . acetaminophen (TYLENOL) tablet 650 mg  650 mg Oral Q6H PRN Irine Seal, MD       Or  . acetaminophen (TYLENOL) suppository 650 mg  650 mg Rectal Q6H PRN Irine Seal, MD      . acetaminophen (TYLENOL) tablet 1,000 mg  1,000 mg Oral Q6H PRN Irine Seal, MD      . cefTRIAXone (ROCEPHIN) 1 g in sodium chloride 0.9 % 100 mL IVPB  1 g Intravenous Q24H Irine Seal, MD      . Chlorhexidine Gluconate Cloth 2 % PADS 6 each  6 each Topical Daily Harold Hedge, MD   6 each at 06/11/20 2138  . ferrous sulfate tablet 325 mg  325 mg Oral Q breakfast Irine Seal, MD   325 mg at 06/12/20 0740  . HYDROmorphone (DILAUDID) injection 0.5 mg  0.5 mg Intravenous Q3H PRN Irine Seal, MD   0.5 mg at 06/11/20 1040  . metoprolol tartrate (LOPRESSOR) injection 5 mg  5 mg Intravenous Q6H PRN Irine Seal, MD      . midodrine (PROAMATINE) tablet 5 mg  5 mg Oral TID WC Irine Seal, MD   5 mg at 06/12/20 0740  . oxyCODONE (Oxy IR/ROXICODONE) immediate release tablet 2.5 mg  2.5 mg Oral Q4H PRN Irine Seal, MD   2.5 mg at 06/12/20 0450  . [START ON 06/14/2020] pantoprazole (PROTONIX) injection 40 mg  40 mg Intravenous Q12H  Irine Seal, MD      . sodium chloride flush (NS) 0.9 % injection 3 mL  3 mL Intravenous Q12H Irine Seal, MD         Objective: Vital signs in last 24 hours: Temp:  [97.4 F (36.3 C)-97.7 F (36.5 C)] 97.4 F (36.3 C) (07/27 0648) Pulse Rate:  [68-75] 70 (07/27 0648) Resp:  [15-27] 20 (07/27 0648) BP: (95-121)/(41-60) 102/60 (07/27 0648) SpO2:  [96 %-100 %] 99 % (07/27 0648) Weight:  [89.8 kg] 89.8 kg (07/26 2010)  Intake/Output from previous day: 07/26 0701 - 07/27 0700 In: 455.3 [I.V.:455.3] Out: 752 [Urine:750; Blood:2] Intake/Output this shift: No intake/output data recorded.   Physical Exam Vitals reviewed.  Constitutional:      Appearance: Normal appearance.  Abdominal:     Palpations: Abdomen is soft.     Tenderness: There is abdominal tenderness (mild LUQ and flank ).  Neurological:     Mental Status: She is alert.     Lab Results:  Recent Labs    06/11/20 0123 06/11/20 1514 06/11/20 1929 06/12/20 2706  WBC 10.7*  --   --  8.0  HGB 7.9*   < > 7.9* 8.6*  HCT 27.9*   < > 27.3* 29.6*  PLT 189  --   --  174   < > = values in this interval not displayed.   BMET Recent Labs    06/11/20 0123 06/12/20 0512  NA 144 145  K 3.5 3.7  CL 115* 113*  CO2 18* 18*  GLUCOSE 118* 73  BUN 63* 56*  CREATININE 3.16* 2.69*  CALCIUM 7.6* 7.5*   PT/INR No results for input(s): LABPROT, INR in the last 72 hours. ABG No results for input(s): PHART, HCO3 in the last 72 hours.  Invalid input(s): PCO2, PO2  Studies/Results: CT ABDOMEN PELVIS WO CONTRAST  Result Date: 06/11/2020 CLINICAL DATA:  Abdominal pain with fever EXAM: CT ABDOMEN AND PELVIS WITHOUT CONTRAST TECHNIQUE: Multidetector CT imaging of the abdomen and pelvis was performed following the standard protocol without IV contrast. Oral contrast was administered. COMPARISON:  October 10, 2013 FINDINGS: Lower chest: There is bibasilar atelectasis. Pacemaker leads are attached to the right atrium and right  ventricle. Evidence of prosthetic mitral valve. Hepatobiliary: The liver contour is subtly nodular, a finding also noted previously. No focal liver lesions are appreciable on this noncontrast enhanced study. Gallbladder absent. There is no appreciable biliary duct dilatation. Pancreas: There is no pancreatic mass or inflammatory focus. Spleen: There is a cyst arising from the spleen measuring 3.4 x 3.2 cm. 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 Adrenals/Urinary Tract: Adrenals bilaterally appear unremarkable. There are suspected hyperdense cysts in the right kidney, largest measuring 9 x 9 mm. There is a 1.5 x 1.2 cm cyst arising from the upper pole of the left kidney. There is moderate hydronephrosis on the left. There is no hydronephrosis on the right. There is a 7 x 3 mm calculus in the lower pole of the left kidney. There is a 2 mm calculus with a nearby 1 mm calculus in the lower pole of the right kidney. There is a 1 mm calculus in the mid right kidney. There is a calculus in the proximal left ureter at the L4 level measuring 7 x 5 mm with an adjacent 2 mm calculus in this region. At the upper sacral level, there is an additional calculus in the left ureter measuring 7 x 4 mm. No ureteral calculus evident on the right. Urinary bladder is midline with wall thickness within normal limits. Stomach/Bowel: There are sigmoid diverticula without diverticulitis. There is a soft tissue masslike area in the cecum measuring 4.9 x 4.2 cm which has attenuation value slightly higher than is expected with serous fluid. There is no appreciable bowel wall or mesenteric thickening. There is no evident bowel obstruction. There is no free air or portal venous air evident. Vascular/Lymphatic: There is aortic and iliac artery atherosclerosis. No aneurysm is evident in the aorta. There is no appreciable adenopathy in the abdomen or pelvis. Reproductive: The uterus is anteverted. There are  several intrauterine calcifications, likely due to leiomyomatous change. No extrauterine pelvic mass evident. Other: The appendix is absent. No periappendiceal region inflammation. No abscess or ascites is evident in the abdomen or pelvis. There is mesenteric thickening in the mid abdomen without bowel or vascular distortion. Musculoskeletal: Bones are osteoporotic. There is stable anterior wedging at L4. There is a degree of spinal stenosis at L2-3 and L3-4 due to bony hypertrophy. No blastic or lytic lesions are evident.  There are no intramuscular lesions. IMPRESSION: 1. Moderate hydronephrosis on the left. There are calculi in the mid ureteral region on the left. A calculus at the level of L4 measures 7 x 5 mm with an adjacent 2 mm calculus. There is a 7 x 4 mm calculus at the upper sacral level on the left as well. 2. Intrarenal calculi bilaterally. Probable hyperdense cysts in right kidney. 3. Soft tissue fullness in the cecal region. This area may represent localized stool. Given the somewhat masslike appearance in this area, it may be reasonable to consider direct visualization after appropriate colonic cleansing. 4. 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 5.  Apparent degree of hepatic cirrhosis. 7.  Gallbladder absent. 8.  Aortic Atherosclerosis (ICD10-I70.0). 9.  Suspected small calcified uterine leiomyomas. 10. Degree of spinal stenosis at L2-3 and L3-4 due to bony hypertrophy. Electronically Signed   By: Lowella Grip III M.D.   On: 06/11/2020 05:08   DG Chest Port 1 View  Result Date: 06/11/2020 CLINICAL DATA:  Shortness of breath and fevers EXAM: PORTABLE CHEST 1 VIEW COMPARISON:  04/04/2020 FINDINGS: Cardiac shadow is enlarged. Postsurgical changes are again seen. Pacemaker is again noted and stable. Chronic blunting of left costophrenic angle is noted. No focal infiltrate is seen. No bony abnormality is noted. IMPRESSION: Chronic changes without  acute abnormality. Electronically Signed   By: Inez Catalina M.D.   On: 06/11/2020 01:42   DG C-Arm 1-60 Min-No Report  Result Date: 06/11/2020 Fluoroscopy was utilized by the requesting physician.  No radiographic interpretation.     Assessment and Plan: Left ureteral stones with obstruction and sepsis.  She is improving s/p stenting.   The foley catheter can be removed when not needed for medical management.  I will have my office arrange follow up with ureteroscopy to remove the stones.       LOS: 1 day    Irine Seal 06/12/2020 [phone] ID: [name], female   DOB: 01/10/1926, 86 y.o.   MRN: [id number]

## 2020-06-12 NOTE — Evaluation (Signed)
Physical Therapy Evaluation Patient Details Name: [name] MRN: [id number] DOB: 1926/07/01 Today's Date: 06/12/2020   History of Present Illness  86 yo female admitted with UTI SIRS. S/P cystoscopy, L double J stent placement 06/11/20. Hx of Afib, CKD, anemia, CHF, NSVT, pacemaker, chronic back pain, spinal stenosis, orthostatic hypotension  Clinical Impression  On eval, pt required Min assist for mobility. She walked ~50 feet with use of a RW. Pt presents with general weakness, decreased activity tolerance, and impaired gait and balance. Dyspnea 3/4 with ambulation on today. O2 >90% on RA. Pt seemed generally confused during session. No family present during session. Will continue to follow during hospital stay.     Follow Up Recommendations Home health PT (at ALF as long as staff can provide current level of assistance)    Equipment Recommendations  None recommended by PT    Recommendations for Other Services       Precautions / Restrictions Precautions Precautions: Fall Restrictions Weight Bearing Restrictions: No      Mobility  Bed Mobility Overal bed mobility: Needs Assistance Bed Mobility: Supine to Sit     Supine to sit: Min guard;HOB elevated     General bed mobility comments: Increased time. Cues required  Transfers Overall transfer level: Needs assistance Equipment used: Rolling walker (2 wheeled) Transfers: Sit to/from Stand Sit to Stand: Min assist         General transfer comment: Small amount of assist to rise. VCs safety. Increased time  Ambulation/Gait Ambulation/Gait assistance: Min assist Gait Distance (Feet): 50 Feet Assistive device: Rolling walker (2 wheeled) Gait Pattern/deviations: Step-through pattern;Decreased stride length;Trunk flexed     General Gait Details: Assist to stabilize throughout distance. Unsteady. Fatigues easily. Dyspnea 3/4. O2 > 90% on RA  Stairs            Wheelchair Mobility    Modified Rankin  (Stroke Patients Only)       Balance Overall balance assessment: Needs assistance         Standing balance support: Bilateral upper extremity supported Standing balance-Leahy Scale: Poor                               Pertinent Vitals/Pain Pain Assessment: Faces Faces Pain Scale: No hurt    Home Living Family/patient expects to be discharged to:: Assisted living               Home Equipment: Walker - 4 wheels      Prior Function Level of Independence: Needs assistance   Gait / Transfers Assistance Needed: uses rollator  ADL's / Homemaking Assistance Needed: pt reports independent with basic self care        Hand Dominance        Extremity/Trunk Assessment   Upper Extremity Assessment Upper Extremity Assessment: Overall WFL for tasks assessed    Lower Extremity Assessment Lower Extremity Assessment: Generalized weakness    Cervical / Trunk Assessment Cervical / Trunk Assessment: Normal  Communication   Communication: HOH  Cognition Arousal/Alertness: Awake/alert Behavior During Therapy: WFL for tasks assessed/performed Overall Cognitive Status: No family/caregiver present to determine baseline cognitive functioning                                 General Comments: seems generally confused      General Comments      Exercises     Assessment/Plan  PT Assessment Patient needs continued PT services  PT Problem List Decreased mobility;Decreased activity tolerance;Decreased balance;Decreased cognition;Decreased knowledge of use of DME;Decreased strength       PT Treatment Interventions DME instruction;Gait training;Therapeutic activities;Therapeutic exercise;Patient/family education;Balance training;Functional mobility training    PT Goals (Current goals can be found in the Care Plan section)  Acute Rehab PT Goals Patient Stated Goal: none stated PT Goal Formulation: With patient Time For Goal Achievement:  06/26/20 Potential to Achieve Goals: Good    Frequency Min 3X/week   Barriers to discharge        Co-evaluation               AM-PAC PT "6 Clicks" Mobility  Outcome Measure Help needed turning from your back to your side while in a flat bed without using bedrails?: A Little Help needed moving from lying on your back to sitting on the side of a flat bed without using bedrails?: A Little Help needed moving to and from a bed to a chair (including a wheelchair)?: A Little Help needed standing up from a chair using your arms (e.g., wheelchair or bedside chair)?: A Little Help needed to walk in hospital room?: A Little Help needed climbing 3-5 steps with a railing? : A Lot 6 Click Score: 17    End of Session Equipment Utilized During Treatment: Gait belt Activity Tolerance: Patient limited by fatigue Patient left: in chair;with call bell/phone within reach;with chair alarm set   PT Visit Diagnosis: Muscle weakness (generalized) (M62.81);Difficulty in walking, not elsewhere classified (R26.2)    Time: 0950-1010 PT Time Calculation (min) (ACUTE ONLY): 20 min   Charges:   PT Evaluation $PT Eval Low Complexity: [place], PT Acute Rehabilitation  Office: [phone] Pager: [phone]

## 2020-06-13 ENCOUNTER — Other Ambulatory Visit: Payer: Self-pay | Admitting: Urology

## 2020-06-13 LAB — BASIC METABOLIC PANEL
Anion gap: 8 (ref 5–15)
BUN: 46 mg/dL — ABNORMAL HIGH (ref 8–23)
CO2: 19 mmol/L — ABNORMAL LOW (ref 22–32)
Calcium: 7.7 mg/dL — ABNORMAL LOW (ref 8.9–10.3)
Chloride: 116 mmol/L — ABNORMAL HIGH (ref 98–111)
Creatinine, Ser: 2.35 mg/dL — ABNORMAL HIGH (ref 0.44–1.00)
GFR calc Af Amer: 20 mL/min — ABNORMAL LOW (ref >60)
GFR calc non Af Amer: 18 mL/min — ABNORMAL LOW (ref >60)
Glucose, Bld: 104 mg/dL — ABNORMAL HIGH (ref 70–99)
Potassium: 3.9 mmol/L (ref 3.5–5.1)
Sodium: 143 mmol/L (ref 135–145)

## 2020-06-13 LAB — URINE CULTURE: Culture: 70000 — AB

## 2020-06-13 MED ORDER — ONDANSETRON HCL 4 MG/2ML IJ SOLN
4.0000 mg | Freq: Four times a day (QID) | INTRAMUSCULAR | Status: DC | PRN
  Administered 2020-06-13 – 2020-06-25 (×6): 4 mg via INTRAVENOUS
  Filled 2020-06-13 (×7): qty 2

## 2020-06-13 NOTE — TOC Progression Note (Signed)
Transition of Care [facility]) - Progression Note    Patient Details  Name: MERELIN HUMAN MRN: [id number] Date of Birth: Jul 24, 1926  [facility] of [facility] CM/SW Contact  Thaddus Mcdowell, Juliann Pulse, RN Phone Number: 06/13/2020, 3:11 PM  Clinical Narrative:  Faxed [facility] aware-dtr [place] Glad with concerns about another Nanine Means [place] Donnetta Simpers will follow up tomorrow.Palliative care to assess.          Expected Discharge Plan and Services                                                 Social Determinants of Health (SDOH) Interventions    Readmission Risk Interventions No flowsheet data found.

## 2020-06-13 NOTE — Progress Notes (Signed)
[facility] Progress Note  [name] 86 y.o. 1926-07-02  CC:  Anemia, FOBT positive stools  Subjective: Patient reports feeling better today.  Denies any abdominal pain.  Denies any recent melena or hematochezia.  Had some nausea with retching earlier today but denies any vomiting or hematemesis.  Per flowsheet, last bowel movement was yesterday and was brown and mushy.  Received colonoscopy report from colonoscopy 08/12/2015.  Report states patient had severe diverticulosis in descending and sigmoid colon, as well as moderate diverticulosis throughout the remainder of the colon.  Severe narrowing of the lumen in the sigmoid colon.  1 7 mm rectal polyp was removed.  Patient experienced tachypnea and hypoxia during the procedure with procedure aborted early due to abdominal distention and respiratory distress.  ROS : Review of Systems  Cardiovascular: Negative for chest pain and palpitations.  Gastrointestinal: Positive for nausea. Negative for abdominal pain, blood in stool, constipation, diarrhea, heartburn, melena and vomiting.   Objective: Vital signs in last 24 hours: Vitals:   06/13/20 0453 06/13/20 1209  BP: (!) 112/48 (!) 120/58  Pulse: 70 71  Resp: 20 20  Temp: 98 F (36.7 C) (!) 97.5 F (36.4 C)  SpO2: 96% 100%    Physical Exam:  General:  Alert, cooperative, no acute distress  Head:  Normocephalic, without obvious abnormality, atraumatic  Eyes:  Conjunctival pallor, EOMs intact  Lungs:   Clear to auscultation bilaterally, respirations unlabored  Heart:  Regular rate and rhythm, S1, S2 normal  Abdomen:   Soft, and non-distended with mild LLQ tenderness, bowel sounds active all four quadrants, no guarding or peritoneal signs  Extremities: Extremities normal, atraumatic, no  edema  Pulses: 2+ and symmetric    Lab Results: Recent Labs    06/12/20 0512 06/13/20 0530  NA 145 143  K 3.7 3.9  CL 113* 116*  CO2 18* 19*  GLUCOSE 73 104*  BUN 56* 46*   CREATININE 2.69* 2.35*  CALCIUM 7.5* 7.7*   Recent Labs    06/11/20 0123  AST 22  ALT 17  ALKPHOS 87  BILITOT 0.6  PROT 5.7*  ALBUMIN 2.4*   Recent Labs    06/11/20 0123 06/11/20 1514 06/11/20 1929 06/12/20 0512  WBC 10.7*  --   --  8.0  NEUTROABS 7.3  --   --   --   HGB 7.9*   < > 7.9* 8.6*  HCT 27.9*   < > 27.3* 29.6*  MCV 86.6  --   --  86.8  PLT 189  --   --  174   < > = values in this interval not displayed.   No results for input(s): LABPROT, INR in the last 72 hours.  Assessment: Anemia, FOBT positive stools -CT 06/11/20: Soft tissue fullness in the cecal region. This area may represent localized stool. Given the somewhat masslike appearance in this area, it may be reasonable to consider direct visualization after appropriate colonic cleansing. -Hgb 8.6 yesterday -No further signs of GI bleeding  Nephrolithiasis and hydronephrosis, s/p left stent placement 06/11/20 -BUN 46/ Cr 2.35  Plan: Patient experienced hypoxia and respiratory distress during last colonoscopy in 2016.  Because of this, as well as her current age, repeat colonoscopy does have increased risk.  Recommend clear liquid diet today with a bowel prep tomorrow.  We will repeat a limited CT after bowel preparation to determine whether or not cecal fullness was related to stool.  Pending limited CT, we will rediscuss proceeding with colonoscopy with  both the patient and patient's daughter.  Continue to monitor H&H with transfusion as needed to maintain hemoglobin greater than 7-8.  [facility] will follow.  Salley Slaughter PA-C 06/13/2020, 1:29 PM  Contact #  [phone]

## 2020-06-13 NOTE — Evaluation (Signed)
Occupational Therapy Evaluation Patient Details Name: [name] MRN: [id number] DOB: Jan 13, 1926 Today's Date: 06/13/2020    History of Present Illness 86 yo female admitted with UTI SIRS. S/P cystoscopy, L double J stent placement 06/11/20. Hx of Afib, CKD, anemia, CHF, NSVT, pacemaker, chronic back pain, spinal stenosis, orthostatic hypotension   Clinical Impression   Patient with functional deficits listed below impacting safety and independence with self care. Patient requiring increased time for processing and moderate difficulty with word finding. Patient min A for bed mobility and for stand pivot to recliner with rolling walker. Poor safety awareness pushing walker away from her before standing up and does not reach back for chair with verbal cues. Patient reports mod I with basic ADLs except bathing, currently requiring increased assist for safety, if ALF able to provide increased assist recommend [place] and ALF, otherwise SNF for rehab to maximize patient safety and independence for self care.    Follow Up Recommendations  Home health OT;Supervision/Assistance - 24 hour;Other (comment);SNF ([place] at ALF vs SNF if ALF unable to provide increased assist)    Equipment Recommendations  None recommended by OT       Precautions / Restrictions Precautions Precautions: Fall Restrictions Weight Bearing Restrictions: No      Mobility Bed Mobility Overal bed mobility: Needs Assistance Bed Mobility: Supine to Sit     Supine to sit: Min assist;HOB elevated     General bed mobility comments: assist to elevate trunk  Transfers Overall transfer level: Needs assistance Equipment used: Rolling walker (2 wheeled) Transfers: Sit to/from Stand Sit to Stand: Min assist         General transfer comment: patient pushed walker far outside base of support prior to standing, cue to reposition for safety. min A to rise and stand pivot to recliner, poor safety awareness does not reach back for  chair when sitting    Balance Overall balance assessment: Needs assistance Sitting-balance support: Feet supported Sitting balance-Leahy Scale: Good     Standing balance support: Bilateral upper extremity supported Standing balance-Leahy Scale: Poor Standing balance comment: reliant on external support                           ADL either performed or assessed with clinical judgement   ADL Overall ADL's : Needs assistance/impaired Eating/Feeding: Set up;Sitting   Grooming: Set up;Sitting   Upper Body Bathing: Set up;Sitting   Lower Body Bathing: Minimal assistance;Sitting/lateral leans;Sit to/from stand   Upper Body Dressing : Set up;Sitting   Lower Body Dressing: Minimal assistance;Sit to/from stand;Sitting/lateral leans Lower Body Dressing Details (indicate cue type and reason): patient able to pull up socks seated EOB Toilet Transfer: Minimal assistance;Cueing for safety;BSC;RW;Stand-pivot Toilet Transfer Details (indicate cue type and reason): decreased safety awareness patient pushing walker away from her when standing from EOB, cues to reach back for recliner  Toileting- Clothing Manipulation and Hygiene: Minimal assistance;Sit to/from stand       Functional mobility during ADLs: Minimal assistance;Rolling walker;Cueing for safety;Cueing for sequencing General ADL Comments: patient requiring increased assistance with self care due to decreased safety awareness, activity tolerance                   Pertinent Vitals/Pain Pain Assessment: No/denies pain     Hand Dominance Left   Extremity/Trunk Assessment Upper Extremity Assessment Upper Extremity Assessment: Overall WFL for tasks assessed   Lower Extremity Assessment Lower Extremity Assessment: Defer to PT evaluation  Cervical / Trunk Assessment Cervical / Trunk Assessment: Normal   Communication Communication Communication: HOH;Expressive difficulties;Other (comment) (patient having  difficulty with word finding)   Cognition Arousal/Alertness: Awake/alert Behavior During Therapy: Flat affect Overall Cognitive Status: No family/caregiver present to determine baseline cognitive functioning                                 General Comments: patient unable to state name of hospital, but oriented to time. patient having difficulty with word finding at times              Home Living Family/patient expects to be discharged to:: Assisted living                             Home Equipment: Walker - 4 wheels          Prior Functioning/Environment Level of Independence: Needs assistance  Gait / Transfers Assistance Needed: uses rollator ADL's / Homemaking Assistance Needed: patient states assist with bathing, otherwise mod I             OT Problem List: Decreased activity tolerance;Impaired balance (sitting and/or standing);Decreased cognition;Decreased safety awareness      OT Treatment/Interventions: Self-care/ADL training;Therapeutic exercise;DME and/or AE instruction;Therapeutic activities;Cognitive remediation/compensation;Patient/family education;Balance training    OT Goals(Current goals can be found in the care plan section) Acute Rehab OT Goals Patient Stated Goal: agreeable to chair for breakfast OT Goal Formulation: With patient Time For Goal Achievement: 06/27/20 Potential to Achieve Goals: Good  OT Frequency: Min 2X/week    AM-PAC OT "6 Clicks" Daily Activity     Outcome Measure Help from another person eating meals?: A Little Help from another person taking care of personal grooming?: A Little Help from another person toileting, which includes using toliet, bedpan, or urinal?: A Little Help from another person bathing (including washing, rinsing, drying)?: A Little Help from another person to put on and taking off regular upper body clothing?: A Little Help from another person to put on and taking off regular lower  body clothing?: A Little 6 Click Score: 18   End of Session Equipment Utilized During Treatment: Rolling walker Nurse Communication: Mobility status  Activity Tolerance: Patient tolerated treatment well Patient left: in chair;with call bell/phone within reach;with chair alarm set  OT Visit Diagnosis: Other abnormalities of gait and mobility (R26.89);Other symptoms and signs involving cognitive function                Time: 6387-5643 OT Time Calculation (min): 23 min Charges:  OT General Charges $OT Visit: 1 Visit OT Evaluation $OT Eval Moderate Complexity: 1 Mod OT Treatments $Self Care/Home Management : 8-22 mins  Delbert Phenix OT Pager: [place] 06/13/2020, 12:11 PM

## 2020-06-13 NOTE — NC FL2 (Signed)
[place] LEVEL OF CARE SCREENING TOOL     IDENTIFICATION  Patient Name: [name] Birthdate: April 18, 1926 Sex: female Admission Date (Current Location): 06/11/2020  [facility] and [place] Number:  Herbalist and Address:  [facility],  [place] [address] [place]      Provider Number: [id number]  Attending Physician Name and Address:  Hosie Poisson, MD  Relative Name and Phone Number:  Tobie Poet dtr [phone]    Current Level of Care: Hospital Recommended Level of Care: [place] Prior Approval Number:    Date Approved/Denied:   PASRR Number:    Discharge Plan: Other (Comment) (ALF)    Current Diagnoses: Patient Active Problem List   Diagnosis Date Noted  . Sepsis secondary to UTI ([place]) 06/12/2020  . Hydronephrosis of left kidney 06/12/2020  . Left ureteral stone 06/12/2020  . GI bleeding 06/11/2020  . Obstructive uropathy 06/11/2020  . Syncope and collapse 04/04/2020  . Influenza A   . Bronchiectasis with acute exacerbation ([place])   . Respiratory distress 10/06/2017  . Orthostatic hypotension dysautonomic syndrome ([place])   . AKI (acute kidney injury) ([place]) 05/21/2016  . Cellulitis of leg, right 08/18/2015  . NSVT (nonsustained ventricular tachycardia) ([place])   . Gout flare 08/16/2015  . Abnormal thyroid function test 08/15/2015  . Right ankle pain 08/15/2015  . History of gout 08/15/2015  . Ankle pain   . S/P MVR (mitral valve repair) 01/05/2014  . Acute posthemorrhagic anemia 12/29/2013  . Encounter for therapeutic drug monitoring 12/12/2013  . Acute lower UTI 11/27/2013  . Gout 11/07/2013  . Orthostatic hypotension 10/31/2013  . Chronic diastolic CHF (congestive heart failure) ([place])   . Hypertension   . Mitral valve disorder   . Permanent atrial fibrillation ([place]) 10/28/2013  . Biventricular cardiac pacemaker in situ 10/14/2013  . Perirectal abscess 10/13/2013  . Chronic anticoagulation  10/10/2013  . Liver cirrhosis ([place]) 10/10/2013  . Thrombocytopenia ([place]) 10/10/2013  . Nausea and vomiting 10/10/2013  . CKD (chronic kidney disease) stage 3, GFR 30-59 ml/min 10/10/2013  . Anemia 10/10/2013    Orientation RESPIRATION BLADDER Height & Weight     Self  Normal Incontinent Weight: 90.6 kg Height:  5\' 2"  (157.5 cm)  BEHAVIORAL SYMPTOMS/MOOD NEUROLOGICAL BOWEL NUTRITION STATUS      Incontinent Diet (Regular)  AMBULATORY STATUS COMMUNICATION OF NEEDS Skin   Limited Assist Verbally Normal                       Personal Care Assistance Level of Assistance  Bathing, Feeding, Dressing Bathing Assistance: Limited assistance Feeding assistance: Limited assistance Dressing Assistance: Limited assistance     Functional Limitations Info  Sight, Hearing, Speech Sight Info: Impaired Administrator, sports) Hearing Info: Adequate Speech Info: Impaired (dentures upper/lower)    SPECIAL CARE FACTORS FREQUENCY  PT (By licensed PT), OT (By licensed OT)     PT Frequency: 5x week OT Frequency: 5x week            Contractures Contractures Info: Not present    Additional Factors Info  Code Status, Allergies Code Status Info: DNR Allergies Info: NKA           Current Medications (06/13/2020):  This is the current hospital active medication list Current Facility-Administered Medications  Medication Dose Route Frequency Provider Last Rate Last Admin  . acetaminophen (TYLENOL) tablet 650 mg  650 mg Oral Q6H PRN Irine Seal, MD  Or  . acetaminophen (TYLENOL) suppository 650 mg  650 mg Rectal Q6H PRN Irine Seal, MD      . acetaminophen (TYLENOL) tablet 1,000 mg  1,000 mg Oral Q6H PRN Irine Seal, MD      . cefTRIAXone (ROCEPHIN) 1 g in sodium chloride 0.9 % 100 mL IVPB  1 g Intravenous Q24H Irine Seal, MD 200 mL/hr at 06/13/20 1125 1 g at 06/13/20 1125  . Chlorhexidine Gluconate Cloth 2 % PADS 6 each  6 each Topical Daily Harold Hedge, MD   6 each at 06/12/20 [phone]  .  ferrous sulfate tablet 325 mg  325 mg Oral Q breakfast Irine Seal, MD   325 mg at 06/13/20 0835  . HYDROmorphone (DILAUDID) injection 0.5 mg  0.5 mg Intravenous Q3H PRN Irine Seal, MD   0.5 mg at 06/12/20 2008  . metoprolol tartrate (LOPRESSOR) injection 5 mg  5 mg Intravenous Q6H PRN Irine Seal, MD      . midodrine (PROAMATINE) tablet 5 mg  5 mg Oral TID WC Irine Seal, MD   5 mg at 06/13/20 1125  . oxyCODONE (Oxy IR/ROXICODONE) immediate release tablet 2.5 mg  2.5 mg Oral Q4H PRN Irine Seal, MD   2.5 mg at 06/12/20 1719  . [START ON 06/14/2020] pantoprazole (PROTONIX) injection 40 mg  40 mg Intravenous Q12H Irine Seal, MD      . polyethylene glycol (MIRALAX / GLYCOLAX) packet 17 g  17 g Oral BID Oretha Milch D, MD   17 g at 06/13/20 1117  . senna-docusate (Senokot-S) tablet 2 tablet  2 tablet Oral BID Oretha Milch D, MD   2 tablet at 06/13/20 1116  . sodium chloride flush (NS) 0.9 % injection 3 mL  3 mL Intravenous Q12H Irine Seal, MD   3 mL at 06/13/20 6301     Discharge Medications: Please see discharge summary for a list of discharge medications.  Relevant Imaging Results:  Relevant Lab Results:   Additional Information SS#[phone]  Imogen Maddalena, Juliann Pulse, RN

## 2020-06-13 NOTE — Progress Notes (Addendum)
PROGRESS NOTE    [name]  IFO:[id number] DOB: 1926-11-04 DOA: 06/11/2020 PCP: [name], MD    No chief complaint on file.   Brief Narrative:   86 year old lady with prior history of tachybradycardia syndrome s/p biventricular pacemaker placement in 2007, severe MR s/p repair, chronic atrial fibrillation not on anticoagulation secondary to GI bleed, abnormal esophageal motility consistent with presbyesophagus, gastritis on last endoscopy with balloon dilatation by [name], pulmonary hypertension presents with 2 days of dysuria, and abdominal pain, black stools for several weeks, and increased confusion admitted for sepsis secondary to urinary tract infection in the setting of obstructive uropathy and acute on chronic anemia.  CT of the abdomen and pelvis shows left-sided moderate hydronephrosis with intrarenal calculi bilaterally, left ureteral calculi and soft tissue fullness in the cecal area.  Patient underwent ureteral stent placement by urology on 06/11/2020.  And was started on IV Rocephin and admitted to [facility] service for further evaluation and management. Assessment & Plan:   Principal Problem:   Acute lower UTI Active Problems:   Liver cirrhosis (HCC)   CKD (chronic kidney disease) stage 3, GFR 30-59 ml/min   Anemia   Biventricular cardiac pacemaker in situ   Permanent atrial fibrillation (HCC)   Chronic diastolic CHF (congestive heart failure) (HCC)   Gout   S/P MVR (mitral valve repair)   AKI (acute kidney injury) ([place])   Orthostatic hypotension dysautonomic syndrome (HCC)   GI bleeding   Obstructive uropathy   Sepsis secondary to UTI ([place])   Hydronephrosis of left kidney   Left ureteral stone   Sepsis secondary to UTI in the setting of obstructive uropathy Resolved.  Underwent ureteral stent placement and was started on IV Rocephin.  Monitor urine cultures and blood cultures.     Obstructive uropathy with left-sided moderate hydronephrosis  secondary to left ureteral stone underwent left ureteral stent placement by urology on 06/11/2020.  Foley catheter to be discontinued. Continue with IV Rocephin and follow urine and blood cultures.     Acute on stage IIIb CKD Probably secondary to obstructive uropathy in the setting of sepsis and relative hypotension on admission. Patient baseline creatinine at 1.8 Patient creatinine peaked at 3.  1 6 but currently improving. Avoid nephrotoxins.    Acute anemia superimposed on anemia of chronic disease status post recent EGD showing gastritis and presbyesophagus.  Patient's baseline hemoglobin around 9 but admitted with a hemoglobin of 7.9. S/p 1 unit of PRBC transfusion. GI consulted and and awaiting recommendations. Continue with IV Protonix twice daily Transfuse to keep hemoglobin greater than 7.    Soft tissue fullness in the cecal area seen on the CT of the abdomen and pelvis.  Differential include mass versus stool. Laxatives and stool softeners. Repeat CT ordered and reviewed with the patient.     Chronic combined systolic and diastolic heart failure Last echocardiogram on May 2021 showed left ventricular ejection fraction of 40% Continue to monitor urine output with strict intake and output and daily weights.    Chronic hypotension Continue with home midodrine.      DVT prophylaxis: SCDs Code Status: DNR Family Communication: None at bedside Disposition:   Status is: Inpatient  Remains inpatient appropriate because:Ongoing diagnostic testing needed not appropriate for outpatient work up   Dispo: The patient is from: Home              Anticipated d/c is to: Pending  Anticipated d/c date is: 2 days              Patient currently is not medically stable to d/c.       Consultants:   Gastroenterology  Urology  Procedures: None  Antimicrobials: None   Subjective: No chest pain or shortness of breath, no nausea vomiting or abdominal  pain Some back pain  Objective: Vitals:   06/12/20 2022 06/13/20 0453 06/13/20 0512 06/13/20 1209  BP: (!) 102/40 (!) 112/48  (!) 120/58  Pulse: 69 70  71  Resp: 20 20  20   Temp: 98 F (36.7 C) 98 F (36.7 C)  (!) 97.5 F (36.4 C)  TempSrc: Oral Oral  Oral  SpO2: 96% 96%  100%  Weight:   90.6 kg   Height:        Intake/Output Summary (Last 24 hours) at 06/13/2020 1718 Last data filed at 06/13/2020 1042 Gross per 24 hour  Intake 480 ml  Output 550 ml  Net -70 ml   Filed Weights   06/11/20 0103 06/11/20 2010 06/13/20 0512  Weight: 88 kg 89.8 kg 90.6 kg    Examination:  General exam: Appears calm and comfortable  Respiratory system: Clear to auscultation. Respiratory effort normal. Cardiovascular system: S1 & S2 heard, RRR. No JVD, . No pedal edema. Gastrointestinal system: Abdomen is nondistended, soft and nontender.Normal bowel sounds heard. Central nervous system: Alert and oriented. No focal neurological deficits. Extremities: Symmetric 5 x 5 power. Skin: No rashes, lesions or ulcers Psychiatry:  Mood & affect appropriate.     Data Reviewed: I have personally reviewed following labs and imaging studies  CBC: Recent Labs  Lab 06/11/20 0123 06/11/20 1514 06/11/20 1929 06/12/20 0512  WBC 10.7*  --   --  8.0  NEUTROABS 7.3  --   --   --   HGB 7.9* 8.0* 7.9* 8.6*  HCT 27.9* 28.0* 27.3* 29.6*  MCV 86.6  --   --  86.8  PLT 189  --   --  454    Basic Metabolic Panel: Recent Labs  Lab 06/11/20 0123 06/12/20 0512 06/13/20 0530  NA 144 145 143  K 3.5 3.7 3.9  CL 115* 113* 116*  CO2 18* 18* 19*  GLUCOSE 118* 73 104*  BUN 63* 56* 46*  CREATININE 3.16* 2.69* 2.35*  CALCIUM 7.6* 7.5* 7.7*    GFR: Estimated Creatinine Clearance: 16.3 mL/min (A) (by C-G formula based on SCr of 2.35 mg/dL (H)).  Liver Function Tests: Recent Labs  Lab 06/11/20 0123  AST 22  ALT 17  ALKPHOS 87  BILITOT 0.6  PROT 5.7*  ALBUMIN 2.4*    CBG: No results for  input(s): GLUCAP in the last 168 hours.   Recent Results (from the past 240 hour(s))  Urine culture     Status: Abnormal   Collection Time: 06/11/20  1:23 AM   Specimen: Urine, Catheterized  Result Value Ref Range Status   Specimen Description   Final    URINE, CATHETERIZED Performed at [facility] [address], [place], [place] [address]    Special Requests   Final    NONE Performed at [facility], [place] [address], [place], [place] [address]    Culture (A)  Final    >=100,000 COLONIES/mL LACTOBACILLUS SPECIES Standardized susceptibility testing for this organism is not available. Performed at [facility], [place] [address] [place],  [address]    Report Status 06/12/2020 FINAL  Final  SARS Coronavirus 2 by RT PCR (hospital order, performed in [facility] hospital lab) Nasopharyngeal Nasopharyngeal Swab     Status: None   Collection Time: 06/11/20  4:25 AM   Specimen: Nasopharyngeal Swab  Result Value Ref Range Status   SARS Coronavirus 2 NEGATIVE NEGATIVE Final    Comment: (NOTE) SARS-CoV-2 target nucleic acids are NOT DETECTED.  The SARS-CoV-2 RNA is generally detectable in upper and lower respiratory specimens during the acute phase of infection. The lowest concentration of SARS-CoV-2 viral copies this assay can detect is 250 copies / mL. A negative result does not preclude SARS-CoV-2 infection and should not be used as the sole basis for treatment or other patient management decisions.  A negative result may occur with improper specimen collection / handling, submission of specimen other than nasopharyngeal swab, presence of viral mutation(s) within the areas targeted by this assay, and inadequate number of viral copies (<250 copies / mL). A negative result must be combined with clinical observations, patient history, and epidemiological information.  Fact Sheet for Patients:     StrictlyIdeas.no  Fact Sheet for Healthcare Providers: BankingDealers.co.za  This test is not yet approved or  cleared by the [place] FDA and has been authorized for detection and/or diagnosis of SARS-CoV-2 by FDA under an Emergency Use Authorization (EUA).  This EUA will remain in effect (meaning this test can be used) for the duration of the COVID-19 declaration under Section 564(b)(1) of the Act, 21 U.S.C. section 360bbb-3(b)(1), unless the authorization is terminated or revoked sooner.  Performed at [facility], [place] [address] [place], [place] [address]   Urine Culture     Status: Abnormal   Collection Time: 06/11/20  7:22 PM   Specimen: Urine, Catheterized  Result Value Ref Range Status   Specimen Description   Final    URINE, CATHETERIZED Performed at [facility] [address], [place], [place] [address]    Special Requests   Final    NONE Performed at [facility], [place] [address], [place], [place] [address]    Culture (A)  Final    70,000 COLONIES/mL LACTOBACILLUS SPECIES Standardized susceptibility testing for this organism is not available. Performed at [facility], [place] [address], [place], [place] [address]    Report Status 06/13/2020 FINAL  Final         Radiology Studies: DG C-Arm 1-60 Min-No Report  Result Date: 06/11/2020 Fluoroscopy was utilized by the requesting physician.  No radiographic interpretation.        Scheduled Meds: . Chlorhexidine Gluconate Cloth  6 each Topical Daily  . ferrous sulfate  325 mg Oral Q breakfast  . midodrine  5 mg Oral TID WC  . [START ON 06/14/2020] pantoprazole  40 mg Intravenous Q12H  . polyethylene glycol  17 g Oral BID  . senna-docusate  2 tablet Oral BID  . sodium chloride flush  3 mL Intravenous Q12H   Continuous Infusions: . cefTRIAXone (ROCEPHIN)  IV 1 g (06/13/20 1125)     LOS: 2  days        Hosie Poisson, MD [facility]   To contact the attending provider between 7A-7P or the covering provider during after hours 7P-7A, please log into the web site [email or web address] and access using universal [place] password for that web site. If you do not have the password, please call the hospital operator.  06/13/2020, 5:18 PM

## 2020-06-14 LAB — BASIC METABOLIC PANEL
Anion gap: 10 (ref 5–15)
BUN: 37 mg/dL — ABNORMAL HIGH (ref 8–23)
CO2: 20 mmol/L — ABNORMAL LOW (ref 22–32)
Calcium: 7.9 mg/dL — ABNORMAL LOW (ref 8.9–10.3)
Chloride: 118 mmol/L — ABNORMAL HIGH (ref 98–111)
Creatinine, Ser: 1.74 mg/dL — ABNORMAL HIGH (ref 0.44–1.00)
GFR calc Af Amer: 29 mL/min — ABNORMAL LOW (ref >60)
GFR calc non Af Amer: 25 mL/min — ABNORMAL LOW (ref >60)
Glucose, Bld: 94 mg/dL (ref 70–99)
Potassium: 3.8 mmol/L (ref 3.5–5.1)
Sodium: 148 mmol/L — ABNORMAL HIGH (ref 135–145)

## 2020-06-14 LAB — CBC WITH DIFFERENTIAL/PLATELET
Abs Immature Granulocytes: 0.05 10*3/uL (ref 0.00–0.07)
Basophils Absolute: 0 10*3/uL (ref 0.0–0.1)
Basophils Relative: 1 %
Eosinophils Absolute: 0.2 10*3/uL (ref 0.0–0.5)
Eosinophils Relative: 3 %
HCT: 30.5 % — ABNORMAL LOW (ref 36.0–46.0)
Hemoglobin: 8.7 g/dL — ABNORMAL LOW (ref 12.0–15.0)
Immature Granulocytes: 1 %
Lymphocytes Relative: 20 %
Lymphs Abs: 1.3 10*3/uL (ref 0.7–4.0)
MCH: 25.1 pg — ABNORMAL LOW (ref 26.0–34.0)
MCHC: 28.5 g/dL — ABNORMAL LOW (ref 30.0–36.0)
MCV: 88.2 fL (ref 80.0–100.0)
Monocytes Absolute: 0.5 10*3/uL (ref 0.1–1.0)
Monocytes Relative: 8 %
Neutro Abs: 4.3 10*3/uL (ref 1.7–7.7)
Neutrophils Relative %: 67 %
Platelets: 215 10*3/uL (ref 150–400)
RBC: 3.46 MIL/uL — ABNORMAL LOW (ref 3.87–5.11)
RDW: 17.5 % — ABNORMAL HIGH (ref 11.5–15.5)
WBC: 6.4 10*3/uL (ref 4.0–10.5)
nRBC: 0 % (ref 0.0–0.2)

## 2020-06-14 LAB — MAGNESIUM: Magnesium: 2.3 mg/dL (ref 1.7–2.4)

## 2020-06-14 MED ORDER — OXYCODONE HCL 5 MG PO TABS
2.5000 mg | ORAL_TABLET | ORAL | Status: DC | PRN
  Administered 2020-06-23 – 2020-06-27 (×5): 2.5 mg via ORAL
  Filled 2020-06-14 (×6): qty 1

## 2020-06-14 MED ORDER — PROMETHAZINE HCL 25 MG/ML IJ SOLN
12.5000 mg | Freq: Four times a day (QID) | INTRAMUSCULAR | Status: DC | PRN
  Administered 2020-06-14: 12.5 mg via INTRAVENOUS
  Filled 2020-06-14: qty 1

## 2020-06-14 MED ORDER — AMOXICILLIN 250 MG PO CAPS
500.0000 mg | ORAL_CAPSULE | Freq: Two times a day (BID) | ORAL | Status: AC
  Administered 2020-06-14 – 2020-06-18 (×9): 500 mg via ORAL
  Filled 2020-06-14 (×8): qty 2

## 2020-06-14 MED ORDER — POLYETHYLENE GLYCOL 3350 17 GM/SCOOP PO POWD
0.5000 | Freq: Once | ORAL | Status: AC
  Administered 2020-06-15: 127.5 g via ORAL
  Filled 2020-06-14: qty 255

## 2020-06-14 NOTE — Progress Notes (Signed)
Patient having nausea and vomiting with clears, unable to drink Miralax prep thus far. We will re-evaluate tomorrow and order CT after Miralax prep/once patient has had several loose bowel movements.

## 2020-06-14 NOTE — Progress Notes (Signed)
Physical Therapy Treatment Patient Details Name: [name] MRN: [id number] DOB: [birth year] Today's Date: 06/14/2020    History of Present Illness 86 yo female admitted with UTI SIRS. S/P cystoscopy, L double J stent placement 06/11/20. Hx of Afib, CKD, anemia, CHF, NSVT, pacemaker, chronic back pain, spinal stenosis, orthostatic hypotension    PT Comments    Pt overall min assist and ambulated in hallway however limited by back pain.  Pt would benefit from SNF if ALF is unable to provide current assist level.   Follow Up Recommendations  SNF;Supervision/Assistance - 24 hour     Equipment Recommendations  None recommended by PT    Recommendations for Other Services       Precautions / Restrictions Precautions Precautions: Fall    Mobility  Bed Mobility Overal bed mobility: Needs Assistance Bed Mobility: Supine to Sit     Supine to sit: HOB elevated;Min guard        Transfers Overall transfer level: Needs assistance Equipment used: Rolling walker (2 wheeled) Transfers: Sit to/from Stand Sit to Stand: Min assist         General transfer comment: assist to rise and steady as well as control descent  Ambulation/Gait Ambulation/Gait assistance: Min assist Gait Distance (Feet): 100 Feet Assistive device: Rolling walker (2 wheeled) Gait Pattern/deviations: Step-through pattern;Decreased stride length;Trunk flexed     General Gait Details: assist for steadying, pt with increased back pain and requested a chair so returned to room in chair   Stairs             Wheelchair Mobility    Modified Rankin (Stroke Patients Only)       Balance                                            Cognition Arousal/Alertness: Awake/alert Behavior During Therapy: Flat affect Overall Cognitive Status: No family/caregiver present to determine baseline cognitive functioning                                 General Comments: little  verbalizations      Exercises      General Comments        Pertinent Vitals/Pain Pain Assessment: 0-10 Pain Score: 10-Worst pain ever Pain Location: back Pain Descriptors / Indicators: Sore;Aching Pain Intervention(s): Limited activity within patient's tolerance;Premedicated before session;Repositioned    Home Living                      Prior Function            PT Goals (current goals can now be found in the care plan section) Progress towards PT goals: Progressing toward goals    Frequency    Min 3X/week      PT Plan Discharge plan needs to be updated    Co-evaluation              AM-PAC PT "6 Clicks" Mobility   Outcome Measure  Help needed turning from your back to your side while in a flat bed without using bedrails?: A Little Help needed moving from lying on your back to sitting on the side of a flat bed without using bedrails?: A Little Help needed moving to and from a bed to a chair (including a wheelchair)?: A Little Help needed standing  up from a chair using your arms (e.g., wheelchair or bedside chair)?: A Little Help needed to walk in hospital room?: A Little Help needed climbing 3-5 steps with a railing? : A Lot 6 Click Score: 17    End of Session Equipment Utilized During Treatment: Gait belt Activity Tolerance: Patient limited by fatigue;Patient limited by pain Patient left: in chair;with call bell/phone within reach;with chair alarm set Nurse Communication: Mobility status PT Visit Diagnosis: Muscle weakness (generalized) (M62.81);Difficulty in walking, not elsewhere classified (R26.2)     Time: 2620-3559 PT Time Calculation (min) (ACUTE ONLY): 15 min  Charges:  $Gait Training: 8-22 mins                     Arlyce Dice, DPT Acute Rehabilitation Services Pager: [phone] Office: [phone]   York Ram E 06/14/2020, 2:18 PM

## 2020-06-14 NOTE — Progress Notes (Signed)
Patient had an episode of emesis this am after receiving po medication and clear liquid diet. Antiemetic administered. Will continue to monitor.

## 2020-06-14 NOTE — Progress Notes (Signed)
[facility] Progress Note  Vonzella Althaus Kreider 86 y.o. [birth year]  CC:  Anemia, FOBT positive stools  Subjective: Patient reports feeling fine today.  Denies any abdominal pain.  Per RN, patient some nausea with an episode of non-bloody non-bilious emesis this morning after taking her pills.  Per flowsheet, last bowel movement 7/27.  ROS : Review of Systems  Cardiovascular: Negative for chest pain and palpitations.  Gastrointestinal: Positive for nausea and vomiting. Negative for abdominal pain, blood in stool, constipation, diarrhea, heartburn and melena.   Objective: Vital signs in last 24 hours: Vitals:   06/13/20 2149 06/14/20 0557  BP: (!) 112/57 (!) 116/55  Pulse: 70 75  Resp: (!) 24 22  Temp: 97.9 F (36.6 C) 97.6 F (36.4 C)  SpO2: 98% 98%    Physical Exam:  General:  Alert, cooperative, no acute distress  Head:  Normocephalic, without obvious abnormality, atraumatic  Eyes:  Conjunctival pallor, EOMs intact  Lungs:   Clear to auscultation bilaterally,mildly tachypneic  Heart:  Regular rate and rhythm, S1, S2 normal  Abdomen:   Soft, non-tender, and non-distended, bowel sounds active all four quadrants, no guarding or peritoneal signs  Extremities: Extremities normal, atraumatic, no  edema  Pulses: 2+ and symmetric    Lab Results: Recent Labs    06/13/20 0530 06/14/20 0514  NA 143 148*  K 3.9 3.8  CL 116* 118*  CO2 19* 20*  GLUCOSE 104* 94  BUN 46* 37*  CREATININE 2.35* 1.74*  CALCIUM 7.7* 7.9*   No results for input(s): AST, ALT, ALKPHOS, BILITOT, PROT, ALBUMIN in the last 72 hours. Recent Labs    06/11/20 1929 06/12/20 0512  WBC  --  8.0  HGB 7.9* 8.6*  HCT 27.3* 29.6*  MCV  --  86.8  PLT  --  174   No results for input(s): LABPROT, INR in the last 72 hours.  Assessment: Anemia, FOBT positive stools -CT 06/11/20: Soft tissue fullness in the cecal region. This area may represent localized stool. Given the somewhat masslike appearance  in this area, it may be reasonable to consider direct visualization after appropriate colonic cleansing. -Hgb 8.7 today, stable as compared to 8.6 7/27 -No signs of GI bleeding  Nephrolithiasis and hydronephrosis, s/p left stent placement 06/11/20 -Renal function improving, BUN 37/Cr 1.74 today  Plan: Patient experienced hypoxia and respiratory distress during last colonoscopy in 2016.  Because of this, as well as her current age, repeat colonoscopy does have increased risk.  Clear liquid diet with 1/2 container of Miralax in 32 oz Gatorade.  After patient has had several bowel movements, we will repeat a limited CT this afternoon to determine whether or not cecal fullness was related to stool.    Pending limited CT, we will rediscuss proceeding with colonoscopy with both the patient and patient's daughter.  Continue anti-emetics PRN.  Continue to monitor H&H with transfusion as needed to maintain hemoglobin greater than 7-8.  [facility] will follow.  Salley Slaughter PA-C 06/14/2020, 9:20 AM  Contact #  [phone]

## 2020-06-14 NOTE — Progress Notes (Signed)
RN contacted by CMT to report seven beats ventricular tachycardia. Patient assessed and denies chest pain and or discomfort, dizziness, weakness at this time. Pt reports intermittent nausea. Provider updated.

## 2020-06-14 NOTE — Care Management Important Message (Signed)
Important Message  Patient Details IM Letter given to the Patient Name: LESLEA [name] MRN: [id number] Date of Birth: 09-28-1926   Medicare Important Message Given:  Yes     Kerin Salen 06/14/2020, 10:16 AM

## 2020-06-14 NOTE — TOC Progression Note (Signed)
Transition of Care [facility]) - Progression Note    Patient Details  Name: [name] MRN: [id number] Date of Birth: 02-Feb-1926  Transition of Care [facility]) CM/SW Contact  Tahani Potier, Juliann Pulse, RN Phone Number: 06/14/2020, 1:04 PM  Clinical Narrative: spoke to dtr karen about concerns-she would like to pursue SNF-will await PT note & recc. Palliative care following.           Expected Discharge Plan and Services                                                 Social Determinants of Health (SDOH) Interventions    Readmission Risk Interventions No flowsheet data found.

## 2020-06-14 NOTE — Progress Notes (Addendum)
PROGRESS NOTE    [name]  EGB:[id number] DOB: 08-Dec-1925 DOA: 06/11/2020 PCP: [name], MD    No chief complaint on file.   Brief Narrative:   86 year old lady with prior history of tachybradycardia syndrome s/p biventricular pacemaker placement in 2007, severe MR s/p repair, chronic atrial fibrillation not on anticoagulation secondary to GI bleed, abnormal esophageal motility consistent with presbyesophagus, gastritis on last endoscopy with balloon dilatation by [name], pulmonary hypertension presents with 2 days of dysuria, and abdominal pain, black stools for several weeks, and increased confusion admitted for sepsis secondary to urinary tract infection in the setting of obstructive uropathy and acute on chronic anemia.  CT of the abdomen and pelvis shows left-sided moderate hydronephrosis with intrarenal calculi bilaterally, left ureteral calculi and soft tissue fullness in the cecal area.  Patient underwent ureteral stent placement by urology on 06/11/2020.  And was started on IV Rocephin and admitted to [facility] service for further evaluation and management. Urine culture showed lactobacillus transition IV Rocephin to oral amoxicillin for another 2 weeks to complete the course.  Assessment & Plan:   Principal Problem:   Acute lower UTI Active Problems:   Liver cirrhosis (HCC)   CKD (chronic kidney disease) stage 3, GFR 30-59 ml/min   Anemia   Biventricular cardiac pacemaker in situ   Permanent atrial fibrillation (HCC)   Chronic diastolic CHF (congestive heart failure) (HCC)   Gout   S/P MVR (mitral valve repair)   AKI (acute kidney injury) ([place])   Orthostatic hypotension dysautonomic syndrome (HCC)   GI bleeding   Obstructive uropathy   Sepsis secondary to UTI ([place])   Hydronephrosis of left kidney   Left ureteral stone   SIRS secondary to UTI in the setting of obstructive uropathy. On admission, pt had tachypnea, MAP <65 mmhg. Afebrile, AKI and Urinary  source of infection.  Resolved.  Underwent ureteral stent placement and was started on IV Rocephin.  Urine culture showed lactobacillus.  IV Rocephin was transitioned to oral amoxicillin. Blood cultures have been negative so far. Appreciate urology recommendation, follow-up with urology in about 2 weeks.  Patient is scheduled for ureteroscopic stone removal on August 10.    Obstructive uropathy with left-sided moderate hydronephrosis secondary to left ureteral stone underwent left ureteral stent placement by urology on 06/11/2020.  Foley catheter to be discontinued. Urine cultures growing lactobacillus.  IV Rocephin transition to oral amoxicillin to complete the course.   Acute on stage IIIb CKD with mild metabolic acidosis Improving.  Probably secondary to obstructive uropathy in the setting of sepsis and relative hypotension on admission. Patient baseline creatinine at 1.8 Patient creatinine peaked at 3.   Creatinine improved to 1.74 , BUN IS 37 today. Avoid nephrotoxins.    Hypocalcemia:  Corrected calcium level is 9.2    Hypernatremia probably secondary to free water deficit from poor oral intake.  Currently sodium is at 148. Discussed with the patient and encouraged her to take water by mouth.  If sodium does not improve by tomorrow will start her on dextrose fluids to bring the sodium down.     Acute anemia superimposed on anemia of chronic disease,  status post recent EGD showing gastritis and presbyesophagus.  Patient's baseline hemoglobin around 9 but admitted with a hemoglobin of 7.9. S/p 1 unit of PRBC transfusion. Hemoglobin has been stable around 8.  Continue with IV Protonix twice daily Transfuse to keep hemoglobin greater than 7.    Soft tissue fullness in the cecal area seen  on the CT of the abdomen and pelvis.  Differential include mass versus stool. Laxatives and stool softeners were ordered by GI. So far no BM, since yesterday pt had multiple episodes of  nausea and vomiting even with clear liquid diet. She is not able to tolerate the glycolax 127.5.  Will need limited CT to the cecal area for further evaluation. GI on board and plan to decide on colonoscopy vs outpatient cologuard test.    Chronic combined systolic and diastolic heart failure She appears compensated.  Last echocardiogram on May 2021 showed left ventricular ejection fraction of 40% Continue to monitor urine output with strict intake and output and daily weights.    Chronic hypotension Continue with home midodrine. BP Parameters are optimal.    ATRIAL fibrillation, S/P Biventricular Pacemaker:  On telemetry , paced.     7 beats of V TACH  Pt asymptomatic.  Will get magnesium level.    DVT prophylaxis: SCDs Code Status: DNR Family Communication: None at bedside, discussed with daughter over the phone this am.  Disposition:   Status is: Inpatient  Remains inpatient appropriate because:Ongoing diagnostic testing needed not appropriate for outpatient work up   Dispo: The patient is from: Home              Anticipated d/c is to: Pending              Anticipated d/c date is: 2 days              Patient currently is not medically stable to d/c.       Consultants:   Gastroenterology  Urology  Procedures: None  Antimicrobials: None   Subjective: No abd pain this am.  RN REPORTS vomiting this am with breakfast.  No chest pain or sob.  Looking exhausted.    Objective: Vitals:   06/13/20 1834 06/13/20 2149 06/14/20 0557 06/14/20 1211  BP: (!) 129/57 (!) 112/57 (!) 116/55 126/67  Pulse: 69 70 75 73  Resp: (!) 24 (!) 24 22 23   Temp: 97.7 F (36.5 C) 97.9 F (36.6 C) 97.6 F (36.4 C) 97.7 F (36.5 C)  TempSrc: Oral Oral Oral Oral  SpO2: 98% 98% 98% 97%  Weight:      Height:        Intake/Output Summary (Last 24 hours) at 06/14/2020 1621 Last data filed at 06/14/2020 1320 Gross per 24 hour  Intake 720 ml  Output 750 ml  Net -30 ml    Filed Weights   06/11/20 0103 06/11/20 2010 06/13/20 0512  Weight: 88 kg 89.8 kg 90.6 kg    Examination:  General exam: calm and comfortable.  Respiratory system: Clear to auscultation bilaterally, no wheezing or rhonchi Cardiovascular system: S1-S2 heard, regular, no JVD Gastrointestinal system: Abdomen is soft, non tender , non distended bowel sounds wnl. Central nervous system: alert and oriented to place and person. Grossly non focal.  Extremities: No cyanosis or clubbing Skin: No rashes seen Psychiatry: Mood is appropriate    Data Reviewed: I have personally reviewed following labs and imaging studies  CBC: Recent Labs  Lab 06/11/20 0123 06/11/20 1514 06/11/20 1929 06/12/20 0512 06/14/20 0514  WBC 10.7*  --   --  8.0 6.4  NEUTROABS 7.3  --   --   --  4.3  HGB 7.9* 8.0* 7.9* 8.6* 8.7*  HCT 27.9* 28.0* 27.3* 29.6* 30.5*  MCV 86.6  --   --  86.8 88.2  PLT 189  --   --  174 709    Basic Metabolic Panel: Recent Labs  Lab 06/11/20 0123 06/12/20 0512 06/13/20 0530 06/14/20 0514  NA 144 145 143 148*  K 3.5 3.7 3.9 3.8  CL 115* 113* 116* 118*  CO2 18* 18* 19* 20*  GLUCOSE 118* 73 104* 94  BUN 63* 56* 46* 37*  CREATININE 3.16* 2.69* 2.35* 1.74*  CALCIUM 7.6* 7.5* 7.7* 7.9*    GFR: Estimated Creatinine Clearance: 22 mL/min (A) (by C-G formula based on SCr of 1.74 mg/dL (H)).  Liver Function Tests: Recent Labs  Lab 06/11/20 0123  AST 22  ALT 17  ALKPHOS 87  BILITOT 0.6  PROT 5.7*  ALBUMIN 2.4*    CBG: No results for input(s): GLUCAP in the last 168 hours.   Recent Results (from the past 240 hour(s))  Urine culture     Status: Abnormal   Collection Time: 06/11/20  1:23 AM   Specimen: Urine, Catheterized  Result Value Ref Range Status   Specimen Description   Final    URINE, CATHETERIZED Performed at [facility] [address] [place], [place] [address]    Special Requests   Final    NONE Performed at [facility], [place] [address] [place], [place] [address]    Culture (A)  Final    >=100,000 COLONIES/mL LACTOBACILLUS SPECIES Standardized susceptibility testing for this organism is not available. Performed at [facility], [place] [address], [place], [place] [address]    Report Status 06/12/2020 FINAL  Final  SARS Coronavirus 2 by RT PCR (hospital order, performed in [facility] hospital lab) Nasopharyngeal Nasopharyngeal Swab     Status: None   Collection Time: 06/11/20  4:25 AM   Specimen: Nasopharyngeal Swab  Result Value Ref Range Status   SARS Coronavirus 2 NEGATIVE NEGATIVE Final    Comment: (NOTE) SARS-CoV-2 target nucleic acids are NOT DETECTED.  The SARS-CoV-2 RNA is generally detectable in upper and lower respiratory specimens during the acute phase of infection. The lowest concentration of SARS-CoV-2 viral copies this assay can detect is 250 copies / mL. A negative result does not preclude SARS-CoV-2 infection and should not be used as the sole basis for treatment or other patient management decisions.  A negative result may occur with improper specimen collection / handling, submission of specimen other than nasopharyngeal swab, presence of viral mutation(s) within the areas targeted by this assay, and inadequate number of viral copies (<250 copies / mL). A negative result must be combined with clinical observations, patient history, and epidemiological information.  Fact Sheet for Patients:   StrictlyIdeas.no  Fact Sheet for Healthcare Providers: BankingDealers.co.za  This test is not yet approved or  cleared by the [place] FDA and has been authorized for detection and/or diagnosis of SARS-CoV-2 by FDA under an Emergency Use Authorization (EUA).  This EUA will remain in effect (meaning this test can be used) for the duration of the COVID-19 declaration under Section 564(b)(1) of the Act, 21  U.S.C. section 360bbb-3(b)(1), unless the authorization is terminated or revoked sooner.  Performed at [facility], [place] [address], [place], [place] [address]   Urine Culture     Status: Abnormal   Collection Time: 06/11/20  7:22 PM   Specimen: Urine, Catheterized  Result Value Ref Range Status   Specimen Description   Final    URINE, CATHETERIZED Performed at [facility] [address], [place], [place] [address]    Special Requests  Final    NONE Performed at [facility], [place] [address], [place], [place] [address]    Culture (A)  Final    70,000 COLONIES/mL LACTOBACILLUS SPECIES Standardized susceptibility testing for this organism is not available. Performed at [facility], [place] [address] [place], [place] [address]    Report Status 06/13/2020 FINAL  Final         Radiology Studies: No results found.      Scheduled Meds: . amoxicillin  500 mg Oral Q12H  . Chlorhexidine Gluconate Cloth  6 each Topical Daily  . ferrous sulfate  325 mg Oral Q breakfast  . midodrine  5 mg Oral TID WC  . pantoprazole  40 mg Intravenous Q12H  . polyethylene glycol powder  0.5 Container Oral Once  . senna-docusate  2 tablet Oral BID  . sodium chloride flush  3 mL Intravenous Q12H   Continuous Infusions:    LOS: 3 days        Hosie Poisson, MD [facility]   To contact the attending provider between 7A-7P or the covering provider during after hours 7P-7A, please log into the web site [email or web address] and access using universal [place] password for that web site. If you do not have the password, please call the hospital operator.  06/14/2020, 4:21 PM

## 2020-06-14 NOTE — Progress Notes (Addendum)
Patient attempted two bites of clear liquids. Pt reported having worsening nausea, then began to vomit. Provider contacted to assist. Will continue to monitor.

## 2020-06-14 NOTE — Progress Notes (Signed)
3 Days Post-Op  Subjective: [name] is doing well s/p stenting.  She has reduced pain and no fever.  She has good UOP and her Cr continues to decline. Cultures are both growing lactobacillus.    ROS:  Review of Systems  All other systems reviewed and are negative.   Anti-infectives: Anti-infectives (From admission, onward)   Start     Dose/Rate Route Frequency Ordered Stop   06/11/20 1030  cefTRIAXone (ROCEPHIN) 1 g in sodium chloride 0.9 % 100 mL IVPB     Discontinue     1 g 200 mL/hr over 30 Minutes Intravenous Every 24 hours 06/11/20 1021     06/11/20 0300  cefTRIAXone (ROCEPHIN) 1 g in sodium chloride 0.9 % 100 mL IVPB        1 g 200 mL/hr over 30 Minutes Intravenous  Once 06/11/20 0259 06/11/20 0458      Current Facility-Administered Medications  Medication Dose Route Frequency Provider Last Rate Last Admin  . acetaminophen (TYLENOL) tablet 650 mg  650 mg Oral Q6H PRN Irine Seal, MD   650 mg at 06/13/20 1640   Or  . acetaminophen (TYLENOL) suppository 650 mg  650 mg Rectal Q6H PRN Irine Seal, MD      . acetaminophen (TYLENOL) tablet 1,000 mg  1,000 mg Oral Q6H PRN Irine Seal, MD      . cefTRIAXone (ROCEPHIN) 1 g in sodium chloride 0.9 % 100 mL IVPB  1 g Intravenous Q24H Irine Seal, MD 200 mL/hr at 06/13/20 1125 1 g at 06/13/20 1125  . Chlorhexidine Gluconate Cloth 2 % PADS 6 each  6 each Topical Daily Harold Hedge, MD   6 each at 06/12/20 [phone]  . ferrous sulfate tablet 325 mg  325 mg Oral Q breakfast Irine Seal, MD   325 mg at 06/13/20 0835  . HYDROmorphone (DILAUDID) injection 0.5 mg  0.5 mg Intravenous Q3H PRN Irine Seal, MD   0.5 mg at 06/12/20 2008  . metoprolol tartrate (LOPRESSOR) injection 5 mg  5 mg Intravenous Q6H PRN Irine Seal, MD      . midodrine (PROAMATINE) tablet 5 mg  5 mg Oral TID WC Irine Seal, MD   5 mg at 06/13/20 1640  . ondansetron (ZOFRAN) injection 4 mg  4 mg Intravenous Q6H PRN Lang Snow, FNP   4 mg at 06/13/20 2015  . oxyCODONE (Oxy  IR/ROXICODONE) immediate release tablet 2.5 mg  2.5 mg Oral Q4H PRN Irine Seal, MD   2.5 mg at 06/13/20 1834  . pantoprazole (PROTONIX) injection 40 mg  40 mg Intravenous Q12H Irine Seal, MD      . polyethylene glycol (MIRALAX / GLYCOLAX) packet 17 g  17 g Oral BID Oretha Milch D, MD   17 g at 06/13/20 2154  . senna-docusate (Senokot-S) tablet 2 tablet  2 tablet Oral BID Oretha Milch D, MD   2 tablet at 06/13/20 2154  . sodium chloride flush (NS) 0.9 % injection 3 mL  3 mL Intravenous Q12H Irine Seal, MD   3 mL at 06/13/20 0836     Objective: Vital signs in last 24 hours: Temp:  [97.5 F (36.4 C)-97.9 F (36.6 C)] 97.6 F (36.4 C) (07/29 0557) Pulse Rate:  [69-75] 75 (07/29 0557) Resp:  [20-24] 22 (07/29 0557) BP: (112-129)/(55-58) 116/55 (07/29 0557) SpO2:  [98 %-100 %] 98 % (07/29 0557)  Intake/Output from previous day: 07/28 0701 - 07/29 0700 In: 480 [P.O.:480] Out: 750 [Urine:750] Intake/Output this shift: No intake/output data  recorded.   Physical Exam Vitals reviewed.  Constitutional:      Appearance: Normal appearance.  Neurological:     Mental Status: She is alert.     Lab Results:  Recent Labs    06/11/20 1929 06/12/20 0512  WBC  --  8.0  HGB 7.9* 8.6*  HCT 27.3* 29.6*  PLT  --  174   BMET Recent Labs    06/13/20 0530 06/14/20 0514  NA 143 148*  K 3.9 3.8  CL 116* 118*  CO2 19* 20*  GLUCOSE 104* 94  BUN 46* 37*  CREATININE 2.35* 1.74*  CALCIUM 7.7* 7.9*   PT/INR No results for input(s): LABPROT, INR in the last 72 hours. ABG No results for input(s): PHART, HCO3 in the last 72 hours.  Invalid input(s): PCO2, PO2  Studies/Results: No results found.   Assessment and Plan: Left ureteral stones with obstruction and sepsis.  She is improving s/p stenting but her cultures are growing Lactobacillus and rocephin is not one of the recommended antibiotics. erythromycin, penicillin, clindamycin, aminoglycosides and imipenem are recommended  and I have notified Dr. Karleen Hampshire.  The foley catheter can be removed when not needed for medical management.  She is scheduled for ureteroscopic stone removal on 8/10.        LOS: 3 days    Irine Seal 06/14/2020 [phone] ID: [name], female   DOB: Jan 27, 1926, 86 y.o.   MRN: [id number] Patient ID: [name], female   DOB: 05/01/1926, 86 y.o.   MRN: [id number]

## 2020-06-15 ENCOUNTER — Inpatient Hospital Stay (HOSPITAL_COMMUNITY): Payer: Medicare Other

## 2020-06-15 LAB — COMPREHENSIVE METABOLIC PANEL
ALT: 13 U/L (ref 0–44)
AST: 16 U/L (ref 15–41)
Albumin: 2.2 g/dL — ABNORMAL LOW (ref 3.5–5.0)
Alkaline Phosphatase: 95 U/L (ref 38–126)
Anion gap: 10 (ref 5–15)
BUN: 28 mg/dL — ABNORMAL HIGH (ref 8–23)
CO2: 20 mmol/L — ABNORMAL LOW (ref 22–32)
Calcium: 7.9 mg/dL — ABNORMAL LOW (ref 8.9–10.3)
Chloride: 117 mmol/L — ABNORMAL HIGH (ref 98–111)
Creatinine, Ser: 1.58 mg/dL — ABNORMAL HIGH (ref 0.44–1.00)
GFR calc Af Amer: 33 mL/min — ABNORMAL LOW (ref >60)
GFR calc non Af Amer: 28 mL/min — ABNORMAL LOW (ref >60)
Glucose, Bld: 96 mg/dL (ref 70–99)
Potassium: 4 mmol/L (ref 3.5–5.1)
Sodium: 147 mmol/L — ABNORMAL HIGH (ref 135–145)
Total Bilirubin: 0.6 mg/dL (ref 0.3–1.2)
Total Protein: 5.3 g/dL — ABNORMAL LOW (ref 6.5–8.1)

## 2020-06-15 LAB — CBC
HCT: 29.8 % — ABNORMAL LOW (ref 36.0–46.0)
Hemoglobin: 8.6 g/dL — ABNORMAL LOW (ref 12.0–15.0)
MCH: 25.4 pg — ABNORMAL LOW (ref 26.0–34.0)
MCHC: 28.9 g/dL — ABNORMAL LOW (ref 30.0–36.0)
MCV: 87.9 fL (ref 80.0–100.0)
Platelets: 209 10*3/uL (ref 150–400)
RBC: 3.39 MIL/uL — ABNORMAL LOW (ref 3.87–5.11)
RDW: 17.5 % — ABNORMAL HIGH (ref 11.5–15.5)
WBC: 6.8 10*3/uL (ref 4.0–10.5)
nRBC: 0 % (ref 0.0–0.2)

## 2020-06-15 MED ORDER — IOHEXOL 9 MG/ML PO SOLN
ORAL | Status: AC
  Filled 2020-06-15: qty 1000

## 2020-06-15 MED ORDER — IOHEXOL 9 MG/ML PO SOLN
500.0000 mL | ORAL | Status: AC
  Administered 2020-06-15: 500 mL via ORAL

## 2020-06-15 NOTE — NC FL2 (Signed)
[place] LEVEL OF CARE SCREENING TOOL     IDENTIFICATION  Patient Name: [name] Birthdate: 1926/05/04 Sex: female Admission Date (Current Location): 06/11/2020  [facility] and [place] Number:  Herbalist and Address:  [facility],  [place] [address] [place]      Provider Number: [id number]  Attending Physician Name and Address:  Hosie Poisson, MD  Relative Name and Phone Number:  Tobie Poet dtr [phone]    Current Level of Care: Hospital Recommended Level of Care: [place] Prior Approval Number:    Date Approved/Denied:   PASRR Number: [id number]  Discharge Plan: SNF    Current Diagnoses: Patient Active Problem List   Diagnosis Date Noted  . Sepsis secondary to UTI ([place]) 06/12/2020  . Hydronephrosis of left kidney 06/12/2020  . Left ureteral stone 06/12/2020  . GI bleeding 06/11/2020  . Obstructive uropathy 06/11/2020  . Syncope and collapse 04/04/2020  . Influenza A   . Bronchiectasis with acute exacerbation ([place])   . Respiratory distress 10/06/2017  . Orthostatic hypotension dysautonomic syndrome ([place])   . AKI (acute kidney injury) ([place]) 05/21/2016  . Cellulitis of leg, right 08/18/2015  . NSVT (nonsustained ventricular tachycardia) ([place])   . Gout flare 08/16/2015  . Abnormal thyroid function test 08/15/2015  . Right ankle pain 08/15/2015  . History of gout 08/15/2015  . Ankle pain   . S/P MVR (mitral valve repair) 01/05/2014  . Acute posthemorrhagic anemia 12/29/2013  . Encounter for therapeutic drug monitoring 12/12/2013  . Acute lower UTI 11/27/2013  . Gout 11/07/2013  . Orthostatic hypotension 10/31/2013  . Chronic diastolic CHF (congestive heart failure) ([place])   . Hypertension   . Mitral valve disorder   . Permanent atrial fibrillation ([place]) 10/28/2013  . Biventricular cardiac pacemaker in situ 10/14/2013  . Perirectal abscess 10/13/2013  . Chronic anticoagulation  10/10/2013  . Liver cirrhosis ([place]) 10/10/2013  . Thrombocytopenia ([place]) 10/10/2013  . Nausea and vomiting 10/10/2013  . CKD (chronic kidney disease) stage 3, GFR 30-59 ml/min 10/10/2013  . Anemia 10/10/2013    Orientation RESPIRATION BLADDER Height & Weight     Self, Time, Situation, Place  Normal Continent Weight: 90.6 kg Height:  5\' 2"  (157.5 cm)  BEHAVIORAL SYMPTOMS/MOOD NEUROLOGICAL BOWEL NUTRITION STATUS      Continent Diet (Regular)  AMBULATORY STATUS COMMUNICATION OF NEEDS Skin   Limited Assist Verbally Normal                       Personal Care Assistance Level of Assistance  Bathing, Feeding, Dressing Bathing Assistance: Limited assistance Feeding assistance: Limited assistance Dressing Assistance: Limited assistance     Functional Limitations Info  Sight, Hearing, Speech Sight Info: Impaired Hearing Info: Adequate Speech Info: Impaired    SPECIAL CARE FACTORS FREQUENCY  PT (By licensed PT), OT (By licensed OT)     PT Frequency: 5x week OT Frequency: 5x week            Contractures Contractures Info: Not present    Additional Factors Info  Code Status, Allergies Code Status Info: DNR Allergies Info: NKA           Current Medications (06/15/2020):  This is the current hospital active medication list Current Facility-Administered Medications  Medication Dose Route Frequency Provider Last Rate Last Admin  . acetaminophen (TYLENOL) tablet 650 mg  650 mg Oral Q6H PRN Irine Seal, MD   650 mg at 06/13/20 1640  Or  . acetaminophen (TYLENOL) suppository 650 mg  650 mg Rectal Q6H PRN Irine Seal, MD      . acetaminophen (TYLENOL) tablet 1,000 mg  1,000 mg Oral Q6H PRN Irine Seal, MD      . amoxicillin (AMOXIL) capsule 500 mg  500 mg Oral Q12H Hosie Poisson, MD   500 mg at 06/15/20 0908  . Chlorhexidine Gluconate Cloth 2 % PADS 6 each  6 each Topical Daily Harold Hedge, MD   6 each at 06/15/20 [phone]  . ferrous sulfate tablet 325 mg  325 mg Oral Q  breakfast Irine Seal, MD   325 mg at 06/15/20 0908  . HYDROmorphone (DILAUDID) injection 0.5 mg  0.5 mg Intravenous Q3H PRN Irine Seal, MD   0.5 mg at 06/12/20 2008  . iohexol (OMNIPAQUE) 9 MG/ML oral solution 500 mL  500 mL Oral Q1H Baron-Johnson, Alison, PA-C   500 mL at 06/15/20 1355  . iohexol (OMNIPAQUE) 9 MG/ML oral solution           . metoprolol tartrate (LOPRESSOR) injection 5 mg  5 mg Intravenous Q6H PRN Irine Seal, MD      . midodrine (PROAMATINE) tablet 5 mg  5 mg Oral TID WC Irine Seal, MD   5 mg at 06/15/20 1157  . ondansetron (ZOFRAN) injection 4 mg  4 mg Intravenous Q6H PRN Lang Snow, FNP   4 mg at 06/14/20 2144  . oxyCODONE (Oxy IR/ROXICODONE) immediate release tablet 2.5 mg  2.5 mg Oral Q4H PRN Salley Slaughter, PA-C      . pantoprazole (PROTONIX) injection 40 mg  40 mg Intravenous Q12H Irine Seal, MD   40 mg at 06/15/20 0909  . promethazine (PHENERGAN) injection 12.5 mg  12.5 mg Intravenous Q6H PRN Hosie Poisson, MD   12.5 mg at 06/14/20 1340  . senna-docusate (Senokot-S) tablet 2 tablet  2 tablet Oral BID Oretha Milch D, MD   2 tablet at 06/15/20 0908  . sodium chloride flush (NS) 0.9 % injection 3 mL  3 mL Intravenous Q12H Irine Seal, MD   3 mL at 06/15/20 4888     Discharge Medications: Please see discharge summary for a list of discharge medications.  Relevant Imaging Results:  Relevant Lab Results:   Additional Information SS#[id number]  Rashae Rother, Juliann Pulse, RN

## 2020-06-15 NOTE — TOC Initial Note (Signed)
Transition of Care [facility]) - Initial/Assessment Note    Patient Details  Name: [name] MRN: [id number] Date of Birth: Jan 29, 1926  Transition of Care [facility]) CM/SW Contact:    Dessa Phi, RN Phone Number: 06/15/2020, 2:33 PM  Clinical Narrative: PT recc SNF-patient/dtr Santiago Glad agree to fax out for SNF-await bed offers,prior starting auth. covid vaccine per dtr pfizer feb 13,mar 10.DNR in shadow chart for MD signature.                  Expected Discharge Plan: Skilled Nursing Facility Barriers to Discharge: Continued Medical Work up   Patient Goals and CMS Choice Patient states their goals for this hospitalization and ongoing recovery are:: go to rehab CMS Medicare.gov Compare Post Acute Care list provided to:: Patient Represenative (must comment) (dtr Santiago Glad) Choice offered to / list presented to : Adult Children  Expected Discharge Plan and Services Expected Discharge Plan: [place]   Discharge Planning Services: CM Consult Post Acute Care Choice: [place] Living arrangements for the past 2 months: [place]                                      Prior Living Arrangements/Services Living arrangements for the past 2 months: [place] Lives with:: Facility Resident Patient language and need for interpreter reviewed:: Yes Do you feel safe going back to the place where you live?: Yes      Need for Family Participation in Patient Care: No (Comment) Care giver support system in place?: Yes (comment) Current home services: DME (rw) Criminal Activity/Legal Involvement Pertinent to Current Situation/Hospitalization: No - Comment as needed  Activities of Daily Living Home Assistive Devices/Equipment: Walker (specify type) ADL Screening (condition at time of admission) Patient's cognitive ability adequate to safely complete daily activities?: No Is the patient deaf or have difficulty hearing?: No Does the  patient have difficulty seeing, even when wearing glasses/contacts?: No Does the patient have difficulty concentrating, remembering, or making decisions?: Yes Patient able to express need for assistance with ADLs?: Yes Does the patient have difficulty dressing or bathing?: Yes Independently performs ADLs?: No Communication: Independent Dressing (OT): Needs assistance Is this a change from baseline?: Pre-admission baseline Grooming: Needs assistance Is this a change from baseline?: Pre-admission baseline Feeding: Independent Is this a change from baseline?: Pre-admission baseline Bathing: Needs assistance Is this a change from baseline?: Pre-admission baseline Toileting: Needs assistance Is this a change from baseline?: Pre-admission baseline In/Out Bed: Needs assistance Is this a change from baseline?: Pre-admission baseline Walks in Home: Needs assistance Is this a change from baseline?: Pre-admission baseline Does the patient have difficulty walking or climbing stairs?: Yes Weakness of Legs: Both Weakness of Arms/Hands: None  Permission Sought/Granted Permission sought to share information with : Case Manager Permission granted to share information with : Yes, Verbal Permission Granted  Share Information with NAME: Case Manager     Permission granted to share info w Relationship: Santiago Glad dtr [phone]     Emotional Assessment Appearance:: Appears stated age Attitude/Demeanor/Rapport: Gracious Affect (typically observed): Accepting Orientation: : Oriented to Self, Oriented to Place, Oriented to  Time, Oriented to Situation Alcohol / Substance Use: Not Applicable Psych Involvement: No (comment)  Admission diagnosis:  Confusion [R41.0] GI bleeding [K92.2] Acute on chronic renal insufficiency [N28.9, N18.9] Urinary tract infection without hematuria, site unspecified [N39.0] Left ureteral stone [N20.1] Anemia, unspecified type [D64.9]  Gastritis with hemorrhage, unspecified  chronicity, unspecified gastritis type [K29.71] Patient Active Problem List   Diagnosis Date Noted  . Sepsis secondary to UTI ([place]) 06/12/2020  . Hydronephrosis of left kidney 06/12/2020  . Left ureteral stone 06/12/2020  . GI bleeding 06/11/2020  . Obstructive uropathy 06/11/2020  . Syncope and collapse 04/04/2020  . Influenza A   . Bronchiectasis with acute exacerbation ([place])   . Respiratory distress 10/06/2017  . Orthostatic hypotension dysautonomic syndrome ([place])   . AKI (acute kidney injury) ([place]) 05/21/2016  . Cellulitis of leg, right 08/18/2015  . NSVT (nonsustained ventricular tachycardia) ([place])   . Gout flare 08/16/2015  . Abnormal thyroid function test 08/15/2015  . Right ankle pain 08/15/2015  . History of gout 08/15/2015  . Ankle pain   . S/P MVR (mitral valve repair) 01/05/2014  . Acute posthemorrhagic anemia 12/29/2013  . Encounter for therapeutic drug monitoring 12/12/2013  . Acute lower UTI 11/27/2013  . Gout 11/07/2013  . Orthostatic hypotension 10/31/2013  . Chronic diastolic CHF (congestive heart failure) ([place])   . Hypertension   . Mitral valve disorder   . Permanent atrial fibrillation ([place]) 10/28/2013  . Biventricular cardiac pacemaker in situ 10/14/2013  . Perirectal abscess 10/13/2013  . Chronic anticoagulation 10/10/2013  . Liver cirrhosis ([place]) 10/10/2013  . Thrombocytopenia ([place]) 10/10/2013  . Nausea and vomiting 10/10/2013  . CKD (chronic kidney disease) stage 3, GFR 30-59 ml/min 10/10/2013  . Anemia 10/10/2013   PCP:  [name], MD Pharmacy:   [facility], [address], [address] [place], [place] [address] Phone: [phone] Fax: [phone]  [id number] - [place], [place] - [address] [place] [address] [place] [address] Phone: [phone] Fax: [phone]     Social Determinants of Health (SDOH) Interventions     Readmission Risk Interventions No flowsheet data found.

## 2020-06-15 NOTE — Progress Notes (Signed)
PROGRESS NOTE    [name]  ZHG:[id number] DOB: July 03, 1926 DOA: 06/11/2020 PCP: [name], MD    No chief complaint on file.   Brief Narrative:   86 year old lady with prior history of tachybradycardia syndrome s/p biventricular pacemaker placement in 2007, severe MR s/p repair, chronic atrial fibrillation not on anticoagulation secondary to GI bleed, abnormal esophageal motility consistent with presbyesophagus, gastritis on last endoscopy with balloon dilatation by [name], pulmonary hypertension presents with 2 days of dysuria, and abdominal pain, black stools for several weeks, and increased confusion admitted for sepsis secondary to urinary tract infection in the setting of obstructive uropathy and acute on chronic anemia.  CT of the abdomen and pelvis shows left-sided moderate hydronephrosis with intrarenal calculi bilaterally, left ureteral calculi and soft tissue fullness in the cecal area.  Patient underwent ureteral stent placement by urology on 06/11/2020.  And was started on IV Rocephin and admitted to [facility] service for further evaluation and management. Urine culture showed lactobacillus transition IV Rocephin to oral amoxicillin for another 2 weeks to complete the course. PT  Seen and examined at bedside. She reports nausea has improved, no vomiting today. She denies any abdominal pain.  She had one BM today and is drinking her miralax prep for the CT abdomen.   Assessment & Plan:   Principal Problem:   Acute lower UTI Active Problems:   Liver cirrhosis (HCC)   CKD (chronic kidney disease) stage 3, GFR 30-59 ml/min   Anemia   Biventricular cardiac pacemaker in situ   Permanent atrial fibrillation (HCC)   Chronic diastolic CHF (congestive heart failure) (HCC)   Gout   S/P MVR (mitral valve repair)   AKI (acute kidney injury) ([place])   Orthostatic hypotension dysautonomic syndrome (HCC)   GI bleeding   Obstructive uropathy   Sepsis secondary to UTI ([place])    Hydronephrosis of left kidney   Left ureteral stone   SIRS secondary to UTI in the setting of obstructive uropathy. On admission, pt had tachypnea, MAP <65 mmhg. Afebrile, AKI and Urinary source of infection.  Resolved.  Underwent ureteral stent placement and was started on IV Rocephin.  Urine culture showed lactobacillus.  IV Rocephin was transitioned to oral amoxicillin. Blood cultures have been negative so far. Appreciate urology recommendation, follow-up with urology in about 2 weeks.  Patient is scheduled for ureteroscopic stone removal on August 10.    Obstructive uropathy with left-sided moderate hydronephrosis secondary to left ureteral stone underwent left ureteral stent placement by urology on 06/11/2020.  Foley catheter to be discontinued. Urine cultures growing lactobacillus.  IV Rocephin transition to oral amoxicillin to complete the course.   Acute on stage IIIb CKD with mild metabolic acidosis Improving.  Probably secondary to obstructive uropathy in the setting of sepsis and relative hypotension on admission. Patient creatinine peaked at 3.   Creatinine improved to 1.58,close to her baseline.  Avoid nephrotoxins.    Hypocalcemia:  Corrected calcium level is 9.2   Hypernatremia probably secondary to free water deficit from poor oral intake.  Encouraged her to take free water orally, sodium has improved from 148 to 147.  Continue to monitor.    Acute anemia superimposed on anemia of chronic disease,  status post recent EGD showing gastritis and presbyesophagus.  Patient's baseline hemoglobin around 9 but admitted with a hemoglobin of 7.9. S/p 1 unit of PRBC transfusion. Hemoglobin has been stable around 8.  Continue with IV Protonix twice daily Transfuse to keep hemoglobin greater than 7.  Soft tissue fullness in the cecal area seen on the CT of the abdomen and pelvis.  Differential include mass versus stool. Laxatives and stool softeners were ordered by GI.  She was unable to take any orally as she was nauseated and started vomiting.  Today her nausea is better and she was able to tolerate the miralax and had one BM . CT to look at the cecal area will be ordered by gi.  GI on board and plan to decide on colonoscopy vs outpatient cologuard test.    Chronic combined systolic and diastolic heart failure She appears compensated.  Last echocardiogram on May 2021 showed left ventricular ejection fraction of 40% Continue to monitor urine output with strict intake and output and daily weights. She denies any sob or chest pain.     Chronic hypotension Continue with home midodrine. BP Parameters are optimal.    ATRIAL fibrillation, S/P Biventricular Pacemaker:  On telemetry , paced.  Rate better .     7 beats of V TACH  Pt asymptomatic.  Magnesium level is greater than 2.    DVT prophylaxis: SCDs Code Status: DNR Family Communication: None at bedside, discussed with daughter over the phone this am.  Disposition:   Status is: Inpatient  Remains inpatient appropriate because:Ongoing diagnostic testing needed not appropriate for outpatient work up   Dispo: The patient is from: Home              Anticipated d/c is to: Pending              Anticipated d/c date is: 2 days              Patient currently is not medically stable to d/c.       Consultants:   Gastroenterology  Urology  Procedures: None  Antimicrobials: None   Subjective: Patient denies any nausea vomiting or abdominal pain this morning she appears to be in good spirits.   Objective: Vitals:   06/14/20 1211 06/14/20 1623 06/14/20 2124 06/15/20 0622  BP: 126/67 (!) 129/58 (!) 120/59 (!) 110/57  Pulse: 73 72 72 69  Resp: 23 16 22 22   Temp: 97.7 F (36.5 C) 98.1 F (36.7 C) 98 F (36.7 C) 98.5 F (36.9 C)  TempSrc: Oral Oral Oral Oral  SpO2: 97% 100% 99% 98%  Weight:      Height:        Intake/Output Summary (Last 24 hours) at 06/15/2020 1200 Last  data filed at 06/15/2020 0842 Gross per 24 hour  Intake 720 ml  Output 700 ml  Net 20 ml   Filed Weights   06/11/20 0103 06/11/20 2010 06/13/20 0512  Weight: 88 kg 89.8 kg 90.6 kg    Examination:  General exam: Alert and comfortable, not in any distress. Respiratory system: Air entry fair bilateral, no wheezing or rhonchi Cardiovascular system: S1-S2 heard, regular rate, no JVD Gastrointestinal system: Abdomen is soft, nontender, nondistended, no signs of peritonitis, bowel sounds normal Central nervous system: Alert and oriented to place and person, grossly nonfocal Extremities: No cyanosis or clubbing Skin: No rashes seen Psychiatry: Mood is appropriate    Data Reviewed: I have personally reviewed following labs and imaging studies  CBC: Recent Labs  Lab 06/11/20 0123 06/11/20 0123 06/11/20 1514 06/11/20 1929 06/12/20 0512 06/14/20 0514 06/15/20 0547  WBC 10.7*  --   --   --  8.0 6.4 6.8  NEUTROABS 7.3  --   --   --   --  4.3  --  HGB 7.9*   < > 8.0* 7.9* 8.6* 8.7* 8.6*  HCT 27.9*   < > 28.0* 27.3* 29.6* 30.5* 29.8*  MCV 86.6  --   --   --  86.8 88.2 87.9  PLT 189  --   --   --  174 215 209   < > = values in this interval not displayed.    Basic Metabolic Panel: Recent Labs  Lab 06/11/20 0123 06/12/20 0512 06/13/20 0530 06/14/20 0514 06/15/20 0547  NA 144 145 143 148* 147*  K 3.5 3.7 3.9 3.8 4.0  CL 115* 113* 116* 118* 117*  CO2 18* 18* 19* 20* 20*  GLUCOSE 118* 73 104* 94 96  BUN 63* 56* 46* 37* 28*  CREATININE 3.16* 2.69* 2.35* 1.74* 1.58*  CALCIUM 7.6* 7.5* 7.7* 7.9* 7.9*  MG  --   --   --  2.3  --     GFR: Estimated Creatinine Clearance: 24.3 mL/min (A) (by C-G formula based on SCr of 1.58 mg/dL (H)).  Liver Function Tests: Recent Labs  Lab 06/11/20 0123 06/15/20 0547  AST 22 16  ALT 17 13  ALKPHOS 87 95  BILITOT 0.6 0.6  PROT 5.7* 5.3*  ALBUMIN 2.4* 2.2*    CBG: No results for input(s): GLUCAP in the last 168 hours.   Recent  Results (from the past 240 hour(s))  Urine culture     Status: Abnormal   Collection Time: 06/11/20  1:23 AM   Specimen: Urine, Catheterized  Result Value Ref Range Status   Specimen Description   Final    URINE, CATHETERIZED Performed at [facility] [address], [place], [place] [address]    Special Requests   Final    NONE Performed at [facility], [place] [address] [place], [place] [address]    Culture (A)  Final    >=100,000 COLONIES/mL LACTOBACILLUS SPECIES Standardized susceptibility testing for this organism is not available. Performed at [facility], [place] [address], [place], [place] [address]    Report Status 06/12/2020 FINAL  Final  SARS Coronavirus 2 by RT PCR (hospital order, performed in [facility] hospital lab) Nasopharyngeal Nasopharyngeal Swab     Status: None   Collection Time: 06/11/20  4:25 AM   Specimen: Nasopharyngeal Swab  Result Value Ref Range Status   SARS Coronavirus 2 NEGATIVE NEGATIVE Final    Comment: (NOTE) SARS-CoV-2 target nucleic acids are NOT DETECTED.  The SARS-CoV-2 RNA is generally detectable in upper and lower respiratory specimens during the acute phase of infection. The lowest concentration of SARS-CoV-2 viral copies this assay can detect is 250 copies / mL. A negative result does not preclude SARS-CoV-2 infection and should not be used as the sole basis for treatment or other patient management decisions.  A negative result may occur with improper specimen collection / handling, submission of specimen other than nasopharyngeal swab, presence of viral mutation(s) within the areas targeted by this assay, and inadequate number of viral copies (<250 copies / mL). A negative result must be combined with clinical observations, patient history, and epidemiological information.  Fact Sheet for Patients:   StrictlyIdeas.no  Fact Sheet for Healthcare  Providers: BankingDealers.co.za  This test is not yet approved or  cleared by the [place] FDA and has been authorized for detection and/or diagnosis of SARS-CoV-2 by FDA under an Emergency Use Authorization (EUA).  This EUA will remain in effect (meaning this test can be used) for the duration of  the COVID-19 declaration under Section 564(b)(1) of the Act, 21 U.S.C. section 360bbb-3(b)(1), unless the authorization is terminated or revoked sooner.  Performed at [facility], [place] [address], [place], [place] [address]   Urine Culture     Status: Abnormal   Collection Time: 06/11/20  7:22 PM   Specimen: Urine, Catheterized  Result Value Ref Range Status   Specimen Description   Final    URINE, CATHETERIZED Performed at [facility] [address] [place], [place] [address]    Special Requests   Final    NONE Performed at [facility], [place] [address] [place], [place] [address]    Culture (A)  Final    70,000 COLONIES/mL LACTOBACILLUS SPECIES Standardized susceptibility testing for this organism is not available. Performed at [facility], [place] [address] [place], [place] [address]    Report Status 06/13/2020 FINAL  Final         Radiology Studies: No results found.      Scheduled Meds: . amoxicillin  500 mg Oral Q12H  . Chlorhexidine Gluconate Cloth  6 each Topical Daily  . ferrous sulfate  325 mg Oral Q breakfast  . midodrine  5 mg Oral TID WC  . pantoprazole  40 mg Intravenous Q12H  . senna-docusate  2 tablet Oral BID  . sodium chloride flush  3 mL Intravenous Q12H   Continuous Infusions:    LOS: 4 days        Hosie Poisson, MD [facility]   To contact the attending provider between 7A-7P or the covering provider during after hours 7P-7A, please log into the web site [email or web address] and access using universal [place] password for that web site. If you  do not have the password, please call the hospital operator.  06/15/2020, 12:00 PM

## 2020-06-15 NOTE — Progress Notes (Signed)
[facility] Progress Note  [name] [name] 86 y.o. 08/02/1926  CC:  Anemia, FOBT positive stools  Subjective: Patient reports feeling fine today.  Denies any abdominal pain.  Has not felt nauseated or had any episodes of vomiting today.    Per RN, patient some nausea with an episode of non-bloody non-bilious emesis this morning after taking her pills.  Per flowsheet, patient had one type 6 BM this morning.  She was given 1/2 of her Miralax prep and has had another BM.    ROS : Review of Systems  Cardiovascular: Negative for chest pain and palpitations.  Gastrointestinal: Positive for nausea and vomiting. Negative for abdominal pain, blood in stool, constipation, diarrhea, heartburn and melena.   Objective: Vital signs in last 24 hours: Vitals:   06/14/20 2124 06/15/20 0622  BP: (!) 120/59 (!) 110/57  Pulse: 72 69  Resp: 22 22  Temp: 98 F (36.7 C) 98.5 F (36.9 C)  SpO2: 99% 98%    Physical Exam:  General:  Alert, cooperative, no acute distress  Head:  Normocephalic, without obvious abnormality, atraumatic  Eyes:  Conjunctival pallor, EOMs intact  Lungs:   Clear to auscultation bilaterally, no respiratory distress  Heart:  Regular rate and rhythm, S1, S2 normal  Abdomen:   Soft, non-tender, and non-distended, bowel sounds active all four quadrants, no guarding or peritoneal signs  Extremities: Extremities normal, atraumatic, no  edema  Pulses: 2+ and symmetric    Lab Results: Recent Labs    06/14/20 0514 06/15/20 0547  NA 148* 147*  K 3.8 4.0  CL 118* 117*  CO2 20* 20*  GLUCOSE 94 96  BUN 37* 28*  CREATININE 1.74* 1.58*  CALCIUM 7.9* 7.9*  MG 2.3  --    Recent Labs    06/15/20 0547  AST 16  ALT 13  ALKPHOS 95  BILITOT 0.6  PROT 5.3*  ALBUMIN 2.2*   Recent Labs    06/14/20 0514 06/15/20 0547  WBC 6.4 6.8  NEUTROABS 4.3  --   HGB 8.7* 8.6*  HCT 30.5* 29.8*  MCV 88.2 87.9  PLT 215 209   No results for input(s): LABPROT, INR in the  last 72 hours.  Assessment: Anemia, FOBT positive stools -CT 06/11/20: Soft tissue fullness in the cecal region. This area may represent localized stool. Given the somewhat masslike appearance in this area, it may be reasonable to consider direct visualization after appropriate colonic cleansing. -Hg 8.6 today, stable as compared to Hgb 8.7 yesterday. -No signs of GI bleeding  Nephrolithiasis and hydronephrosis, s/p left stent placement 06/11/20 -Renal function improving, BUN 28/Cr 1.58 today  Plan: Patient experienced hypoxia and respiratory distress during last colonoscopy in 2016.  Because of this, as well as her current age, repeat colonoscopy does have increased risk.  Complete remaining Miralax with plan for repeat CT this afternoon to determine whether or not cecal fullness was related to stool.  Per radiology, CT abd/pelvis needed to visualize cecum.  Pending CT findings, we will rediscuss proceeding with colonoscopy with both the patient and patient's daughter.  Continue anti-emetics PRN.  Continue to monitor H&H with transfusion as needed to maintain hemoglobin greater than 7-8.  [facility] will follow.  Salley Slaughter PA-C 06/15/2020, 11:52 AM  Contact #  [phone]

## 2020-06-15 NOTE — Progress Notes (Signed)
1500- to present.[name] assumed care of patient from off going RN. Condition stable/ Pt alert and oriented.Comt with current plan of care

## 2020-06-15 NOTE — Progress Notes (Signed)
Occupational Therapy Treatment Patient Details Name: [name] MRN: [id number] DOB: 1926/03/04 Today's Date: 06/15/2020    History of present illness 86 yo female admitted with UTI SIRS. S/P cystoscopy, L double J stent placement 06/11/20. Hx of Afib, CKD, anemia, CHF, NSVT, pacemaker, chronic back pain, spinal stenosis, orthostatic hypotension   OT comments  Patient min G assist for functional transfers and sink side grooming/hygiene. Patient tolerate standing ~5 mins to complete oral care and face + hand hygiene, leans heavily onto sink for support. Decreased safety with walker management and cues to reach back for chair with sitting. Continue to recommend D/C to venue listed below to maximize patient safety with self care and functional transfers.    Follow Up Recommendations  SNF    Equipment Recommendations  None recommended by OT       Precautions / Restrictions Precautions Precautions: Fall Restrictions Weight Bearing Restrictions: No       Mobility Bed Mobility               General bed mobility comments: in recliner  Transfers Overall transfer level: Needs assistance Equipment used: Rolling walker (2 wheeled) Transfers: Sit to/from Stand Sit to Stand: Min guard         General transfer comment: patient demo's pushing from chair to stand however still needs cue for reaching back for chair with sitting and not to push walker too far forward. Min G for safety with eccentric control into chair    Balance Overall balance assessment: Needs assistance Sitting-balance support: Feet supported Sitting balance-Leahy Scale: Good     Standing balance support: Bilateral upper extremity supported Standing balance-Leahy Scale: Poor Standing balance comment: reliant on external support                           ADL either performed or assessed with clinical judgement   ADL Overall ADL's : Needs assistance/impaired     Grooming: Oral care;Wash/dry  face;Wash/dry hands;Min guard;Standing Grooming Details (indicate cue type and reason): patient tolerate standing sinkside for approx 5 minutes to perform g/h, leans heavily onto sink for support                             Functional mobility during ADLs: Min guard;Cueing for safety;Rolling walker                 Cognition Arousal/Alertness: Awake/alert Behavior During Therapy: WFL for tasks assessed/performed Overall Cognitive Status: No family/caregiver present to determine baseline cognitive functioning                                 General Comments: difficulty with word finding, decreased safety with walker, follows 1 step commands appropriately                    Pertinent Vitals/ Pain       Pain Assessment: Faces Faces Pain Scale: No hurt         Frequency  Min 2X/week        Progress Toward Goals  OT Goals(current goals can now be found in the care plan section)  Progress towards OT goals: Progressing toward goals  Acute Rehab OT Goals Patient Stated Goal: agreeable to brush teeth OT Goal Formulation: With patient Time For Goal Achievement: 06/27/20 Potential to Achieve Goals: Good ADL Goals Pt Will  Perform Grooming: standing;with modified independence Pt Will Perform Upper Body Dressing: sitting;with modified independence Pt Will Perform Lower Body Dressing: with modified independence;sit to/from stand;sitting/lateral leans Pt Will Transfer to Toilet: with modified independence;ambulating Additional ADL Goal #1: patient will demonstrate safe body mechanics during functional transfers in 2/3 trials with no verbal cues to minimize risk of falls,  Plan Discharge plan needs to be updated       AM-PAC OT "6 Clicks" Daily Activity     Outcome Measure   Help from another person eating meals?: A Little Help from another person taking care of personal grooming?: A Little Help from another person toileting, which includes  using toliet, bedpan, or urinal?: A Little Help from another person bathing (including washing, rinsing, drying)?: A Little Help from another person to put on and taking off regular upper body clothing?: A Little Help from another person to put on and taking off regular lower body clothing?: A Little 6 Click Score: 18    End of Session Equipment Utilized During Treatment: Rolling walker  OT Visit Diagnosis: Other abnormalities of gait and mobility (R26.89);Other symptoms and signs involving cognitive function   Activity Tolerance Patient tolerated treatment well   Patient Left in chair;with call bell/phone within reach;with chair alarm set   Nurse Communication Mobility status        Time: 1100-1118 OT Time Calculation (min): 18 min  Charges: OT General Charges $OT Visit: 1 Visit OT Treatments $Self Care/Home Management : 8-22 mins  Delbert Phenix OT Pager: [place] 06/15/2020, 12:48 PM

## 2020-06-16 LAB — BASIC METABOLIC PANEL
Anion gap: 7 (ref 5–15)
BUN: 22 mg/dL (ref 8–23)
CO2: 21 mmol/L — ABNORMAL LOW (ref 22–32)
Calcium: 7.8 mg/dL — ABNORMAL LOW (ref 8.9–10.3)
Chloride: 117 mmol/L — ABNORMAL HIGH (ref 98–111)
Creatinine, Ser: 1.37 mg/dL — ABNORMAL HIGH (ref 0.44–1.00)
GFR calc Af Amer: 39 mL/min — ABNORMAL LOW (ref >60)
GFR calc non Af Amer: 34 mL/min — ABNORMAL LOW (ref >60)
Glucose, Bld: 84 mg/dL (ref 70–99)
Potassium: 3.7 mmol/L (ref 3.5–5.1)
Sodium: 145 mmol/L (ref 135–145)

## 2020-06-16 MED ORDER — SODIUM CHLORIDE 0.9 % IV SOLN: INTRAVENOUS | Status: DC

## 2020-06-16 MED ORDER — POLYETHYLENE GLYCOL 3350 17 G PO PACK
17.0000 g | PACK | ORAL | Status: AC
  Administered 2020-06-16 (×3): 17 g via ORAL
  Filled 2020-06-16 (×3): qty 1

## 2020-06-16 NOTE — Progress Notes (Signed)
[name] 2:01 PM  Subjective: Patient seen and examined and case discussed extensively with her son and daughter and we gave them the option of doing nothing versus a colonoscopy on Monday and the risks were extensively discussed including a discussion about her previous colonoscopy but they would like to proceed  Objective: Vital signs stable afebrile no acute distress abdomen soft minimal generalized discomfort BUN and creatinine improved no repeat CBC CT reviewed  Assessment: Concerns over cecal colon cancer  Plan: We will proceed with colonoscopy on Monday and continue clear liquids and hold iron until after the procedure and will gently prep today and tomorrow and again as above the risks of the procedure were discussed with the family  [facility]  office [phone] After 5PM or if no answer call [phone]

## 2020-06-16 NOTE — Progress Notes (Signed)
PROGRESS NOTE    [name]  XBM:[id number] DOB: [birth year] DOA: 06/11/2020 PCP: [name], MD    No chief complaint on file.   Brief Narrative:   86 year old lady with prior history of tachybradycardia syndrome s/p biventricular pacemaker placement in 2007, severe MR s/p repair, chronic atrial fibrillation not on anticoagulation secondary to GI bleed, abnormal esophageal motility consistent with presbyesophagus, gastritis on last endoscopy with balloon dilatation by [name], pulmonary hypertension presents with 2 days of dysuria, and abdominal pain, black stools for several weeks, and increased confusion admitted for sepsis secondary to urinary tract infection in the setting of obstructive uropathy and acute on chronic anemia.  CT of the abdomen and pelvis shows left-sided moderate hydronephrosis with intrarenal calculi bilaterally, left ureteral calculi and soft tissue fullness in the cecal area.  Patient underwent ureteral stent placement by urology on 06/11/2020.  And was started on IV Rocephin and admitted to [facility] service for further evaluation and management. Urine culture showed lactobacillus transition IV Rocephin to oral amoxicillin for another 2 weeks to complete the course. Pt seen and examined at bedside, pt underwent CT abd showing cecal mass concerning for malignancy. She is scheduled for colonoscopy on Monday.   Assessment & Plan:   Principal Problem:   Acute lower UTI Active Problems:   Liver cirrhosis (HCC)   CKD (chronic kidney disease) stage 3, GFR 30-59 ml/min   Anemia   Biventricular cardiac pacemaker in situ   Permanent atrial fibrillation (HCC)   Chronic diastolic CHF (congestive heart failure) (HCC)   Gout   S/P MVR (mitral valve repair)   AKI (acute kidney injury) ([place])   Orthostatic hypotension dysautonomic syndrome (HCC)   GI bleeding   Obstructive uropathy   Sepsis secondary to UTI ([place])   Hydronephrosis of left kidney   Left ureteral  stone   SIRS secondary to UTI in the setting of obstructive uropathy. On admission, pt had tachypnea, MAP <65 mmhg. Afebrile, AKI and Urinary source of infection.  Resolved.  Underwent ureteral stent placement and was started on IV Rocephin.  Urine culture showed lactobacillus.  IV Rocephin was transitioned to oral amoxicillin to complete the course. Blood cultures have been negative so far. Appreciate urology recommendation, follow-up with urology in about 2 weeks.  Patient is scheduled for ureteroscopic stone removal on August 10.    Obstructive uropathy with left-sided moderate hydronephrosis secondary to left ureteral stone underwent left ureteral stent placement by urology on 06/11/2020.  Foley catheter to be discontinued. Urine cultures growing lactobacillus.  IV Rocephin transition to oral amoxicillin to complete the course.   Acute on stage IIIb CKD with mild metabolic acidosis Improving.  Probably secondary to obstructive uropathy in the setting of sepsis and relative hypotension on admission. Patient creatinine peaked at 3.   Creatinine improved to 1.37,close to her baseline.  Avoid nephrotoxins.    Hypocalcemia:  Corrected calcium level is 9.2   Hypernatremia probably secondary to free water deficit from poor oral intake.  Encouraged her to take free water orally, sodium has improved from 148 to 145.  Continue to monitor.    Acute anemia superimposed on anemia of chronic disease,  status post recent EGD showing gastritis and presbyesophagus.  Patient's baseline hemoglobin around 9 but admitted with a hemoglobin of 7.9. S/p 1 unit of PRBC transfusion. Hemoglobin has been stable around 8.  Continue with IV Protonix twice daily Transfuse to keep hemoglobin greater than 7.    Soft tissue fullness in the cecal  area seen on the CT of the abdomen and pelvis.  Differential include mass versus stool. Ct abdomen and pelvis showed Lobulated mass in the cecum concerning for  malignancy. Further evaluation with colonoscopy recommended. No bowel obstruction. 2. Left ureteral stent with mild left hydronephrosis. A 5 mm proximal left ureteral stone as well as a curvilinear calculus in the region of the left renal pelvis. 3. Sigmoid diverticulosis. 4. Cirrhosis.  GI on board and she is scheduled for colonoscopy on Monday.    Chronic combined systolic and diastolic heart failure She appears compensated.  Last echocardiogram on May 2021 showed left ventricular ejection fraction of 40% Continue to monitor urine output with strict intake and output and daily weights. She denies any sob or chest pain.     Chronic hypotension Continue with home midodrine. BP Parameters are optimal.    ATRIAL fibrillation, S/P Biventricular Pacemaker:  On telemetry , paced.  Rate better .        DVT prophylaxis: SCDs Code Status: DNR Family Communication: discussed with daughter at bedside.  Disposition:   Status is: Inpatient  Remains inpatient appropriate because:Ongoing diagnostic testing needed not appropriate for outpatient work up   Dispo: The patient is from: Home              Anticipated d/c is to: Pending              Anticipated d/c date is: 2 days              Patient currently is not medically stable to d/c.       Consultants:   Gastroenterology  Urology  Procedures: None  Antimicrobials: None   Subjective: No new complaints.  .   Objective: Vitals:   06/15/20 2200 06/16/20 0437 06/16/20 0537 06/16/20 1427  BP: (!) 115/54  (!) 115/55 (!) 113/51  Pulse: 69  72 69  Resp: 18  20 (!) 24  Temp: 98 F (36.7 C)  (!) 97.5 F (36.4 C) 97.8 F (36.6 C)  TempSrc: Oral  Oral Oral  SpO2: 99%  100% 100%  Weight:  88.4 kg    Height:        Intake/Output Summary (Last 24 hours) at 06/16/2020 1535 Last data filed at 06/16/2020 1321 Gross per 24 hour  Intake 360 ml  Output --  Net 360 ml   Filed Weights   06/11/20 2010 06/13/20 0512  06/16/20 0437  Weight: 89.8 kg 90.6 kg 88.4 kg    Examination:  General exam: Alert and comfortable.  Respiratory system: air entry fair, no wheezing or rhonchi.  Cardiovascular system: S1S2, RRR no JVD.  Gastrointestinal system: Abdomen is soft non tender, non distended, bowel sounds wnl.  Central nervous system: Alert and oriented, grossly non focal.  Extremities: no cyanosis or clubbing Skin: No rashes seen.  Psychiatry: mood is appropriate.    Data Reviewed: I have personally reviewed following labs and imaging studies  CBC: Recent Labs  Lab 06/11/20 0123 06/11/20 0123 06/11/20 1514 06/11/20 1929 06/12/20 0512 06/14/20 0514 06/15/20 0547  WBC 10.7*  --   --   --  8.0 6.4 6.8  NEUTROABS 7.3  --   --   --   --  4.3  --   HGB 7.9*   < > 8.0* 7.9* 8.6* 8.7* 8.6*  HCT 27.9*   < > 28.0* 27.3* 29.6* 30.5* 29.8*  MCV 86.6  --   --   --  86.8 88.2 87.9  PLT 189  --   --   --  174 215 209   < > = values in this interval not displayed.    Basic Metabolic Panel: Recent Labs  Lab 06/12/20 0512 06/13/20 0530 06/14/20 0514 06/15/20 0547 06/16/20 0602  NA 145 143 148* 147* 145  K 3.7 3.9 3.8 4.0 3.7  CL 113* 116* 118* 117* 117*  CO2 18* 19* 20* 20* 21*  GLUCOSE 73 104* 94 96 84  BUN 56* 46* 37* 28* 22  CREATININE 2.69* 2.35* 1.74* 1.58* 1.37*  CALCIUM 7.5* 7.7* 7.9* 7.9* 7.8*  MG  --   --  2.3  --   --     GFR: Estimated Creatinine Clearance: 27.6 mL/min (A) (by C-G formula based on SCr of 1.37 mg/dL (H)).  Liver Function Tests: Recent Labs  Lab 06/11/20 0123 06/15/20 0547  AST 22 16  ALT 17 13  ALKPHOS 87 95  BILITOT 0.6 0.6  PROT 5.7* 5.3*  ALBUMIN 2.4* 2.2*    CBG: No results for input(s): GLUCAP in the last 168 hours.   Recent Results (from the past 240 hour(s))  Urine culture     Status: Abnormal   Collection Time: 06/11/20  1:23 AM   Specimen: Urine, Catheterized  Result Value Ref Range Status   Specimen Description   Final    URINE,  CATHETERIZED Performed at [facility] [address], [place], [place] [address]    Special Requests   Final    NONE Performed at [facility], [place] [address] [place], [place] [address]    Culture (A)  Final    >=100,000 COLONIES/mL LACTOBACILLUS SPECIES Standardized susceptibility testing for this organism is not available. Performed at [facility], [place] [address], [place], [place] [address]    Report Status 06/12/2020 FINAL  Final  SARS Coronavirus 2 by RT PCR (hospital order, performed in [facility] hospital lab) Nasopharyngeal Nasopharyngeal Swab     Status: None   Collection Time: 06/11/20  4:25 AM   Specimen: Nasopharyngeal Swab  Result Value Ref Range Status   SARS Coronavirus 2 NEGATIVE NEGATIVE Final    Comment: (NOTE) SARS-CoV-2 target nucleic acids are NOT DETECTED.  The SARS-CoV-2 RNA is generally detectable in upper and lower respiratory specimens during the acute phase of infection. The lowest concentration of SARS-CoV-2 viral copies this assay can detect is 250 copies / mL. A negative result does not preclude SARS-CoV-2 infection and should not be used as the sole basis for treatment or other patient management decisions.  A negative result may occur with improper specimen collection / handling, submission of specimen other than nasopharyngeal swab, presence of viral mutation(s) within the areas targeted by this assay, and inadequate number of viral copies (<250 copies / mL). A negative result must be combined with clinical observations, patient history, and epidemiological information.  Fact Sheet for Patients:   StrictlyIdeas.no  Fact Sheet for Healthcare Providers: BankingDealers.co.za  This test is not yet approved or  cleared by the [place] FDA and has been authorized for detection and/or diagnosis of SARS-CoV-2 by FDA under an Emergency Use Authorization  (EUA).  This EUA will remain in effect (meaning this test can be used) for the duration of the COVID-19 declaration under Section 564(b)(1) of the Act, 21 U.S.C. section 360bbb-3(b)(1), unless the authorization is terminated or revoked sooner.  Performed at [facility], [place] [address] [place], [place] [address]   Urine Culture     Status: Abnormal   Collection Time: 06/11/20  7:22 PM   Specimen: Urine, Catheterized  Result Value Ref Range Status   Specimen Description   Final    URINE, CATHETERIZED Performed at [facility] [address] [place], [place] [address]    Special Requests   Final    NONE Performed at [facility], [place] [address] [place], [place] [address]    Culture (A)  Final    70,000 COLONIES/mL LACTOBACILLUS SPECIES Standardized susceptibility testing for this organism is not available. Performed at [facility], [place] [address] [place], [place] [address]    Report Status 06/13/2020 FINAL  Final         Radiology Studies: CT ABDOMEN PELVIS WO CONTRAST  Result Date: 06/15/2020 CLINICAL DATA:  86 year old female with concern for cecal stool versus mass. EXAM: CT ABDOMEN AND PELVIS WITHOUT CONTRAST TECHNIQUE: Multidetector CT imaging of the abdomen and pelvis was performed following the standard protocol without IV contrast. COMPARISON:  CT abdomen pelvis dated 06/11/2020. FINDINGS: Evaluation of this exam is limited in the absence of intravenous contrast. Lower chest: Small bilateral pleural effusions with associated bibasilar subsegmental atelectasis. Pneumonia is not excluded. Clinical correlation is recommended. There is mild cardiomegaly. Coronary vascular calcification, cardiac pacemaker wires, and mechanical mitral valve. There is hypoattenuation of the cardiac blood pool suggestive of anemia. Clinical correlation is recommended. No intra-abdominal free air or free fluid. Hepatobiliary:  Morphologic changes of cirrhosis. No intrahepatic biliary ductal dilatation. Cholecystectomy. No retained calcified stone noted in the central CBD. Pancreas: The pancreas is unremarkable as visualized. Spleen: There is a 2.5 cm indeterminate hypodense lesion in the spleen which demonstrates fluid attenuation, likely a cyst or hemangioma. Small calcified splenic granuloma. Adrenals/Urinary Tract: The adrenal glands unremarkable. There is a left ureteral stent with proximal tip in the upper pole collecting system and distal end within the urinary bladder. There is mild left hydronephrosis. There is a 5 mm stone in the proximal left ureter along the course of the stent. A curvilinear calculus in the region of the left renal pelvis likely combination of the previously seen stone and the stone noted previously in the inferior pole of the left kidney. No stone identified within the left kidney. There is a 4 mm linear nonobstructing right renal inferior pole calculus as well as several additional punctate nonobstructing calculi. There is no hydronephrosis on the right. Subcentimeter bilateral renal lesions are not characterized on this CT. There is mild bilateral renal parenchyma atrophy. The right ureter is unremarkable. The urinary bladder is grossly unremarkable. Air within the bladder likely related to recent instrumentation. Stomach/Bowel: There is loose stool noted throughout the colon. There is a 5.6 x 3.5 cm lobulated mass in the cecum. Further evaluation with colonoscopy recommended. There is sigmoid diverticulosis without active inflammatory changes. There is no bowel obstruction. Appendectomy. Vascular/Lymphatic: Advanced aortoiliac atherosclerotic disease. The IVC is unremarkable. No portal venous gas. 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 There is no adenopathy. Reproductive: Small calcified uterine fibroids. Other: Mild subcutaneous edema. Musculoskeletal: Osteopenia with  degenerative changes of the spine. No acute osseous pathology. IMPRESSION: 1. Lobulated mass in the cecum concerning for malignancy. Further evaluation with colonoscopy recommended. No bowel obstruction. 2. Left ureteral stent with mild left hydronephrosis. A 5 mm proximal left ureteral stone as well as a curvilinear calculus in the region of the left renal pelvis. 3. Sigmoid diverticulosis. 4. Cirrhosis. 5. 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 This is  relatively similar to CTs dating back to 2014. CT with IV contrast may provide better evaluation on a nonemergent/outpatient basis, if clinically indicated. 6. Small bilateral pleural effusions with associated bibasilar subsegmental atelectasis versus infiltrate. 7. Aortic Atherosclerosis (ICD10-I70.0). Electronically Signed   By: Anner Crete M.D.   On: 06/15/2020 18:33        Scheduled Meds: . amoxicillin  500 mg Oral Q12H  . Chlorhexidine Gluconate Cloth  6 each Topical Daily  . midodrine  5 mg Oral TID WC  . pantoprazole  40 mg Intravenous Q12H  . polyethylene glycol  17 g Oral Q2H  . senna-docusate  2 tablet Oral BID  . sodium chloride flush  3 mL Intravenous Q12H   Continuous Infusions:    LOS: 5 days        Hosie Poisson, MD [facility]   To contact the attending provider between 7A-7P or the covering provider during after hours 7P-7A, please log into the web site [email or web address] and access using universal [place] password for that web site. If you do not have the password, please call the hospital operator.  06/16/2020, 3:35 PM

## 2020-06-17 DIAGNOSIS — D649 Anemia, unspecified: Secondary | ICD-10-CM

## 2020-06-17 LAB — BASIC METABOLIC PANEL
Anion gap: 7 (ref 5–15)
BUN: 16 mg/dL (ref 8–23)
CO2: 21 mmol/L — ABNORMAL LOW (ref 22–32)
Calcium: 7.7 mg/dL — ABNORMAL LOW (ref 8.9–10.3)
Chloride: 115 mmol/L — ABNORMAL HIGH (ref 98–111)
Creatinine, Ser: 1.42 mg/dL — ABNORMAL HIGH (ref 0.44–1.00)
GFR calc Af Amer: 37 mL/min — ABNORMAL LOW (ref >60)
GFR calc non Af Amer: 32 mL/min — ABNORMAL LOW (ref >60)
Glucose, Bld: 110 mg/dL — ABNORMAL HIGH (ref 70–99)
Potassium: 3.9 mmol/L (ref 3.5–5.1)
Sodium: 143 mmol/L (ref 135–145)

## 2020-06-17 LAB — CBC
HCT: 32.1 % — ABNORMAL LOW (ref 36.0–46.0)
Hemoglobin: 9.2 g/dL — ABNORMAL LOW (ref 12.0–15.0)
MCH: 25.1 pg — ABNORMAL LOW (ref 26.0–34.0)
MCHC: 28.7 g/dL — ABNORMAL LOW (ref 30.0–36.0)
MCV: 87.7 fL (ref 80.0–100.0)
Platelets: 220 10*3/uL (ref 150–400)
RBC: 3.66 MIL/uL — ABNORMAL LOW (ref 3.87–5.11)
RDW: 17.7 % — ABNORMAL HIGH (ref 11.5–15.5)
WBC: 6 10*3/uL (ref 4.0–10.5)
nRBC: 0 % (ref 0.0–0.2)

## 2020-06-17 NOTE — Progress Notes (Signed)
PROGRESS NOTE    [name]  BLT:[id number] DOB: [birth year] DOA: 06/11/2020 PCP: [name], MD    No chief complaint on file.   Brief Narrative:   86 year old lady with prior history of tachybradycardia syndrome s/p biventricular pacemaker placement in 2007, severe MR s/p repair, chronic atrial fibrillation not on anticoagulation secondary to GI bleed, abnormal esophageal motility consistent with presbyesophagus, gastritis on last endoscopy with balloon dilatation by [name], pulmonary hypertension presents with 2 days of dysuria, and abdominal pain, black stools for several weeks, and increased confusion admitted for sepsis secondary to urinary tract infection in the setting of obstructive uropathy and acute on chronic anemia.  CT of the abdomen and pelvis shows left-sided moderate hydronephrosis with intrarenal calculi bilaterally, left ureteral calculi and soft tissue fullness in the cecal area.  Patient underwent ureteral stent placement by urology on 06/11/2020.  And was started on IV Rocephin and admitted to [facility] service for further evaluation and management. Urine culture showed lactobacillus transition IV Rocephin to oral amoxicillin for another 2 weeks to complete the course. She underwent CT abd showing cecal mass concerning for malignancy. She is scheduled for colonoscopy on Monday. Pt seen and examined at bedside. RN reports 2 to 3 loose bowel movements today probably from the bowel prep. No nausea or vomiting.   Assessment & Plan:   Principal Problem:   Acute lower UTI Active Problems:   Liver cirrhosis (HCC)   CKD (chronic kidney disease) stage 3, GFR 30-59 ml/min   Anemia   Biventricular cardiac pacemaker in situ   Permanent atrial fibrillation (HCC)   Chronic diastolic CHF (congestive heart failure) (HCC)   Gout   S/P MVR (mitral valve repair)   AKI (acute kidney injury) ([place])   Orthostatic hypotension dysautonomic syndrome (HCC)   GI bleeding    Obstructive uropathy   Sepsis secondary to UTI ([place])   Hydronephrosis of left kidney   Left ureteral stone   SIRS secondary to UTI in the setting of obstructive uropathy. On admission, pt had tachypnea, MAP <65 mmhg. Afebrile, AKI and Urinary source of infection.  Resolved.  Underwent ureteral stent placement and was started on IV Rocephin.  Urine culture showed lactobacillus.  IV Rocephin was transitioned to oral amoxicillin to complete the course. Blood cultures have been negative so far. Appreciate urology recommendation, follow-up with urology in about 2 weeks.  Patient is scheduled for ureteroscopic stone removal on August 10.    Obstructive uropathy with left-sided moderate hydronephrosis secondary to left ureteral stone underwent left ureteral stent placement by urology on 06/11/2020.  Foley catheter to be discontinued. Urine cultures growing lactobacillus.  IV Rocephin transition to oral amoxicillin to complete the course.   Acute on stage IIIb CKD with mild metabolic acidosis Improving.  Probably secondary to obstructive uropathy in the setting of sepsis and relative hypotension on admission. Patient creatinine peaked at 3. Currently at 1.42.  Avoid nephrotoxins.    Hypocalcemia:  Corrected calcium level is 9.2   Hypernatremia probably secondary to free water deficit from poor oral intake.  Encouraged her to take free water orally, sodium has improved from 148 to 145.  Continue to monitor.    Acute anemia superimposed on anemia of chronic disease,  status post recent EGD showing gastritis and presbyesophagus.  Patient's baseline hemoglobin around 9 but admitted with a hemoglobin of 7.9. S/p 1 unit of PRBC transfusion. Hemoglobin has been around 8 to 9.  Continue with IV Protonix twice daily Transfuse to keep  hemoglobin greater than 7.    Soft tissue fullness in the cecal area seen on the CT of the abdomen and pelvis.  Differential include mass versus stool. Ct  abdomen and pelvis showed Lobulated mass in the cecum concerning for malignancy. Further evaluation with colonoscopy recommended. No bowel obstruction. 2. Left ureteral stent with mild left hydronephrosis. A 5 mm proximal left ureteral stone as well as a curvilinear calculus in the region of the left renal pelvis. 3. Sigmoid diverticulosis. 4. Cirrhosis.  GI on board and she is scheduled for colonoscopy on Monday.loose bowel movements probably from the bowel prep. Continue to monitor. Check bmp in am.     Chronic combined systolic and diastolic heart failure She appears compensated.  Last echocardiogram on May 2021 showed left ventricular ejection fraction of 40% Continue to monitor urine output with strict intake and output and daily weights. No chest pain or sob on RA.     Chronic hypotension Continue with home midodrine. BP parameters are well controlled.    ATRIAL fibrillation, S/P Biventricular Pacemaker:  On telemetry , paced.  Rate better .        DVT prophylaxis: SCDs Code Status: DNR Family Communication: discussed with daughter at bedside on 7/31.  Disposition:   Status is: Inpatient  Remains inpatient appropriate because:Ongoing diagnostic testing needed not appropriate for outpatient work up   Dispo: The patient is from: Home              Anticipated d/c is to: Pending              Anticipated d/c date is: 2 days              Patient currently is not medically stable to d/c.       Consultants:   Gastroenterology  Urology  Procedures: None  Antimicrobials: None   Subjective: No nausea, or vomiting or abd pain.    Objective: Vitals:   06/16/20 1427 06/16/20 2100 06/17/20 0540 06/17/20 1236  BP: (!) 113/51 (!) 115/57 (!) 107/52 (!) 117/54  Pulse: 69 68 70 70  Resp: (!) 24 18 20  (!) 24  Temp: 97.8 F (36.6 C) (!) 97.5 F (36.4 C) (!) 97.4 F (36.3 C) 98.3 F (36.8 C)  TempSrc: Oral Oral Oral Oral  SpO2: 100% 100% 98% 100%    Weight:   88.3 kg   Height:        Intake/Output Summary (Last 24 hours) at 06/17/2020 1555 Last data filed at 06/17/2020 0809 Gross per 24 hour  Intake 240 ml  Output 325 ml  Net -85 ml   Filed Weights   06/13/20 0512 06/16/20 0437 06/17/20 0540  Weight: 90.6 kg 88.4 kg 88.3 kg    Examination:  General exam: alert and comfortable. No distress noted.  Respiratory system: Air entry fair bilateral , no wheezing heard.  Cardiovascular system: S1-S2 heard, regular rate rhythm, no JVD Gastrointestinal system: Abdomen is soft, nontender, nondistended, bowel sounds normal Central nervous system: Alert and oriented to place and person, grossly nonfocal Extremities: no painful ROM,  Skin: No ulcers evident Psychiatry: Flat affect   Data Reviewed: I have personally reviewed following labs and imaging studies  CBC: Recent Labs  Lab 06/11/20 0123 06/11/20 1514 06/11/20 1929 06/12/20 0512 06/14/20 0514 06/15/20 0547 06/17/20 0938  WBC 10.7*  --   --  8.0 6.4 6.8 6.0  NEUTROABS 7.3  --   --   --  4.3  --   --  HGB 7.9*   < > 7.9* 8.6* 8.7* 8.6* 9.2*  HCT 27.9*   < > 27.3* 29.6* 30.5* 29.8* 32.1*  MCV 86.6  --   --  86.8 88.2 87.9 87.7  PLT 189  --   --  174 215 209 220   < > = values in this interval not displayed.    Basic Metabolic Panel: Recent Labs  Lab 06/13/20 0530 06/14/20 0514 06/15/20 0547 06/16/20 0602 06/17/20 0938  NA 143 148* 147* 145 143  K 3.9 3.8 4.0 3.7 3.9  CL 116* 118* 117* 117* 115*  CO2 19* 20* 20* 21* 21*  GLUCOSE 104* 94 96 84 110*  BUN 46* 37* 28* 22 16  CREATININE 2.35* 1.74* 1.58* 1.37* 1.42*  CALCIUM 7.7* 7.9* 7.9* 7.8* 7.7*  MG  --  2.3  --   --   --     GFR: Estimated Creatinine Clearance: 26.6 mL/min (A) (by C-G formula based on SCr of 1.42 mg/dL (H)).  Liver Function Tests: Recent Labs  Lab 06/11/20 0123 06/15/20 0547  AST 22 16  ALT 17 13  ALKPHOS 87 95  BILITOT 0.6 0.6  PROT 5.7* 5.3*  ALBUMIN 2.4* 2.2*    CBG: No  results for input(s): GLUCAP in the last 168 hours.   Recent Results (from the past 240 hour(s))  Urine culture     Status: Abnormal   Collection Time: 06/11/20  1:23 AM   Specimen: Urine, Catheterized  Result Value Ref Range Status   Specimen Description   Final    URINE, CATHETERIZED Performed at [facility] [address] [place], [place] [address]    Special Requests   Final    NONE Performed at [facility], [place] [address], [place], [place] [address]    Culture (A)  Final    >=100,000 COLONIES/mL LACTOBACILLUS SPECIES Standardized susceptibility testing for this organism is not available. Performed at [facility], [place] [address], [place], [place] [address]    Report Status 06/12/2020 FINAL  Final  SARS Coronavirus 2 by RT PCR (hospital order, performed in [facility] hospital lab) Nasopharyngeal Nasopharyngeal Swab     Status: None   Collection Time: 06/11/20  4:25 AM   Specimen: Nasopharyngeal Swab  Result Value Ref Range Status   SARS Coronavirus 2 NEGATIVE NEGATIVE Final    Comment: (NOTE) SARS-CoV-2 target nucleic acids are NOT DETECTED.  The SARS-CoV-2 RNA is generally detectable in upper and lower respiratory specimens during the acute phase of infection. The lowest concentration of SARS-CoV-2 viral copies this assay can detect is 250 copies / mL. A negative result does not preclude SARS-CoV-2 infection and should not be used as the sole basis for treatment or other patient management decisions.  A negative result may occur with improper specimen collection / handling, submission of specimen other than nasopharyngeal swab, presence of viral mutation(s) within the areas targeted by this assay, and inadequate number of viral copies (<250 copies / mL). A negative result must be combined with clinical observations, patient history, and epidemiological information.  Fact Sheet for Patients:     StrictlyIdeas.no  Fact Sheet for Healthcare Providers: BankingDealers.co.za  This test is not yet approved or  cleared by the [place] FDA and has been authorized for detection and/or diagnosis of SARS-CoV-2 by FDA under an Emergency Use Authorization (EUA).  This EUA will remain in effect (meaning this test can be used) for the duration of the COVID-19 declaration  under Section 564(b)(1) of the Act, 21 U.S.C. section 360bbb-3(b)(1), unless the authorization is terminated or revoked sooner.  Performed at [facility], [place] [address] [place], [place] [address]   Urine Culture     Status: Abnormal   Collection Time: 06/11/20  7:22 PM   Specimen: Urine, Catheterized  Result Value Ref Range Status   Specimen Description   Final    URINE, CATHETERIZED Performed at [facility] [address], [place], [place] [address]    Special Requests   Final    NONE Performed at [facility], [place] [address] [place], [place] [address]    Culture (A)  Final    70,000 COLONIES/mL LACTOBACILLUS SPECIES Standardized susceptibility testing for this organism is not available. Performed at [facility], [place] [address] [place], [place] [address]    Report Status 06/13/2020 FINAL  Final         Radiology Studies: CT ABDOMEN PELVIS WO CONTRAST  Result Date: 06/15/2020 CLINICAL DATA:  86 year old female with concern for cecal stool versus mass. EXAM: CT ABDOMEN AND PELVIS WITHOUT CONTRAST TECHNIQUE: Multidetector CT imaging of the abdomen and pelvis was performed following the standard protocol without IV contrast. COMPARISON:  CT abdomen pelvis dated 06/11/2020. FINDINGS: Evaluation of this exam is limited in the absence of intravenous contrast. Lower chest: Small bilateral pleural effusions with associated bibasilar subsegmental atelectasis. Pneumonia is not excluded. Clinical  correlation is recommended. There is mild cardiomegaly. Coronary vascular calcification, cardiac pacemaker wires, and mechanical mitral valve. There is hypoattenuation of the cardiac blood pool suggestive of anemia. Clinical correlation is recommended. No intra-abdominal free air or free fluid. Hepatobiliary: Morphologic changes of cirrhosis. No intrahepatic biliary ductal dilatation. Cholecystectomy. No retained calcified stone noted in the central CBD. Pancreas: The pancreas is unremarkable as visualized. Spleen: There is a 2.5 cm indeterminate hypodense lesion in the spleen which demonstrates fluid attenuation, likely a cyst or hemangioma. Small calcified splenic granuloma. Adrenals/Urinary Tract: The adrenal glands unremarkable. There is a left ureteral stent with proximal tip in the upper pole collecting system and distal end within the urinary bladder. There is mild left hydronephrosis. There is a 5 mm stone in the proximal left ureter along the course of the stent. A curvilinear calculus in the region of the left renal pelvis likely combination of the previously seen stone and the stone noted previously in the inferior pole of the left kidney. No stone identified within the left kidney. There is a 4 mm linear nonobstructing right renal inferior pole calculus as well as several additional punctate nonobstructing calculi. There is no hydronephrosis on the right. Subcentimeter bilateral renal lesions are not characterized on this CT. There is mild bilateral renal parenchyma atrophy. The right ureter is unremarkable. The urinary bladder is grossly unremarkable. Air within the bladder likely related to recent instrumentation. Stomach/Bowel: There is loose stool noted throughout the colon. There is a 5.6 x 3.5 cm lobulated mass in the cecum. Further evaluation with colonoscopy recommended. There is sigmoid diverticulosis without active inflammatory changes. There is no bowel obstruction. Appendectomy.  Vascular/Lymphatic: Advanced aortoiliac atherosclerotic disease. The IVC is unremarkable. No portal venous gas. 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 There is no adenopathy. Reproductive: Small calcified uterine fibroids. Other: Mild subcutaneous edema. Musculoskeletal: Osteopenia with degenerative changes of the spine. No acute osseous pathology. IMPRESSION: 1. Lobulated mass in the cecum concerning for malignancy. Further evaluation with colonoscopy recommended. No bowel  obstruction. 2. Left ureteral stent with mild left hydronephrosis. A 5 mm proximal left ureteral stone as well as a curvilinear calculus in the region of the left renal pelvis. 3. Sigmoid diverticulosis. 4. Cirrhosis. 5. 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 This is relatively similar to CTs dating back to 2014. CT with IV contrast may provide better evaluation on a nonemergent/outpatient basis, if clinically indicated. 6. Small bilateral pleural effusions with associated bibasilar subsegmental atelectasis versus infiltrate. 7. Aortic Atherosclerosis (ICD10-I70.0). Electronically Signed   By: Anner Crete M.D.   On: 06/15/2020 18:33        Scheduled Meds: . amoxicillin  500 mg Oral Q12H  . Chlorhexidine Gluconate Cloth  6 each Topical Daily  . midodrine  5 mg Oral TID WC  . pantoprazole  40 mg Intravenous Q12H  . senna-docusate  2 tablet Oral BID  . sodium chloride flush  3 mL Intravenous Q12H   Continuous Infusions: . sodium chloride    . sodium chloride       LOS: 6 days        Hosie Poisson, MD [facility]   To contact the attending provider between 7A-7P or the covering provider during after hours 7P-7A, please log into the web site [email or web address] and access using universal [place] password for that web site. If you do not have the password, please call the hospital operator.  06/17/2020, 3:55 PM

## 2020-06-17 NOTE — H&P (View-Only) (Signed)
[name] 11:17 AM  Subjective: Patient without any complaints says her bowels are loose no obvious blood tolerating clear liquids  Objective: No signs stable afebrile no acute distress abdomen is soft nontender labs stable  Assessment: Abnormal CT guaiac positive anemia  Plan: We rediscussed colonoscopy which either Dr. Michail Sermon or Paulita Fujita will proceed with tomorrow  [facility]  office [phone] After 5PM or if no answer call [phone]

## 2020-06-17 NOTE — Progress Notes (Signed)
Pt had 10 beats of VTACH on tele. Pt currently asleep in recliner. Dr. Karleen Hampshire made aware.

## 2020-06-17 NOTE — Progress Notes (Signed)
[name] 11:17 AM  Subjective: Patient without any complaints says her bowels are loose no obvious blood tolerating clear liquids  Objective: No signs stable afebrile no acute distress abdomen is soft nontender labs stable  Assessment: Abnormal CT guaiac positive anemia  Plan: We rediscussed colonoscopy which either Dr. Michail Sermon or Paulita Fujita will proceed with tomorrow  [facility]  office [phone] After 5PM or if no answer call [phone]

## 2020-06-18 ENCOUNTER — Encounter (HOSPITAL_COMMUNITY): Payer: Self-pay | Admitting: Internal Medicine

## 2020-06-18 ENCOUNTER — Inpatient Hospital Stay (HOSPITAL_COMMUNITY): Payer: Medicare Other | Admitting: Certified Registered Nurse Anesthetist

## 2020-06-18 ENCOUNTER — Encounter (HOSPITAL_COMMUNITY)
Admission: EM | Disposition: A | Discharge status: Skilled Nursing Facility | Payer: Self-pay | Source: Home / Self Care | Attending: Internal Medicine

## 2020-06-18 ENCOUNTER — Other Ambulatory Visit: Payer: Self-pay

## 2020-06-18 HISTORY — PX: SUBMUCOSAL TATTOO INJECTION: SHX6856

## 2020-06-18 HISTORY — PX: COLONOSCOPY: SHX5424

## 2020-06-18 HISTORY — PX: BIOPSY: SHX5522

## 2020-06-18 SURGERY — COLONOSCOPY
Anesthesia: Monitor Anesthesia Care

## 2020-06-18 MED ORDER — SPOT INK MARKER SYRINGE KIT
PACK | SUBMUCOSAL | Status: DC | PRN
  Administered 2020-06-18: 2 mL via SUBMUCOSAL

## 2020-06-18 MED ORDER — PROPOFOL 500 MG/50ML IV EMUL
INTRAVENOUS | Status: DC | PRN
  Administered 2020-06-18: 100 ug/kg/min via INTRAVENOUS

## 2020-06-18 MED ORDER — LIDOCAINE 2% (20 MG/ML) 5 ML SYRINGE
INTRAMUSCULAR | Status: DC | PRN
  Administered 2020-06-18: 60 mg via INTRAVENOUS

## 2020-06-18 MED ORDER — PHENYLEPHRINE HCL-NACL 10-0.9 MG/250ML-% IV SOLN
INTRAVENOUS | Status: DC | PRN
Start: 2020-06-18 — End: 2020-06-18
  Administered 2020-06-18: 50 ug/min via INTRAVENOUS

## 2020-06-18 MED ORDER — SPOT INK MARKER SYRINGE KIT
PACK | SUBMUCOSAL | Status: AC
  Filled 2020-06-18: qty 5

## 2020-06-18 MED ORDER — LACTATED RINGERS IV SOLN: INTRAVENOUS | Status: DC | PRN

## 2020-06-18 MED ORDER — LACTATED RINGERS IV SOLN: INTRAVENOUS | Status: DC

## 2020-06-18 MED ORDER — PROPOFOL 10 MG/ML IV BOLUS
INTRAVENOUS | Status: DC | PRN
  Administered 2020-06-18: 20 mg via INTRAVENOUS

## 2020-06-18 NOTE — Progress Notes (Signed)
PROGRESS NOTE    [name]  BWI:[id number] DOB: Dec 02, 1925 DOA: 06/11/2020 PCP: [name], MD    No chief complaint on file.   Brief Narrative:   86 year old lady with prior history of tachybradycardia syndrome s/p biventricular pacemaker placement in 2007, severe MR s/p repair, chronic atrial fibrillation not on anticoagulation secondary to GI bleed, abnormal esophageal motility consistent with presbyesophagus, gastritis on last endoscopy with balloon dilatation by [name], pulmonary hypertension presents with 2 days of dysuria, and abdominal pain, black stools for several weeks, and increased confusion admitted for sepsis secondary to urinary tract infection in the setting of obstructive uropathy and acute on chronic anemia.  CT of the abdomen and pelvis shows left-sided moderate hydronephrosis with intrarenal calculi bilaterally, left ureteral calculi and soft tissue fullness in the cecal area.  Patient underwent ureteral stent placement by urology on 06/11/2020.  And was started on IV Rocephin and admitted to [facility] service for further evaluation and management. Urine culture showed lactobacillus transition IV Rocephin to oral amoxicillin for another 2 weeks to complete the course. She underwent CT abd showing cecal mass concerning for malignancy.  Pt seen and examined at bedside .pt continues to have runny bowel movements. She underwent colonoscopy  by [name] showing large cecal mass concerning for malignancy. Gen surgery consulted to see for recommendations.   Assessment & Plan:   Principal Problem:   Acute lower UTI Active Problems:   Liver cirrhosis (HCC)   CKD (chronic kidney disease) stage 3, GFR 30-59 ml/min   Anemia   Biventricular cardiac pacemaker in situ   Permanent atrial fibrillation (HCC)   Chronic diastolic CHF (congestive heart failure) (HCC)   Gout   S/P MVR (mitral valve repair)   AKI (acute kidney injury) ([place])   Orthostatic hypotension  dysautonomic syndrome (HCC)   GI bleeding   Obstructive uropathy   Sepsis secondary to UTI ([place])   Hydronephrosis of left kidney   Left ureteral stone   SIRS secondary to UTI in the setting of obstructive uropathy. On admission, pt had tachypnea, MAP <65 mmhg. Afebrile, AKI and Urinary source of infection.  Resolved.  Underwent ureteral stent placement and was started on IV Rocephin.  Urine culture showed lactobacillus.  IV Rocephin was transitioned to oral amoxicillin to complete the course. Blood cultures have been negative so far. Appreciate urology recommendation, follow-up with urology in about 2 weeks.  Patient is scheduled for ureteroscopic stone removal on August 10.    Obstructive uropathy with left-sided moderate hydronephrosis secondary to left ureteral stone underwent left ureteral stent placement by urology on 06/11/2020.  Foley catheter to discontinued. Urine cultures growing lactobacillus.  IV Rocephin transition to oral amoxicillin to complete the course. Pt currently denies any dysuria.  CT abd shows Left ureteral stent with mild left hydronephrosis. A 5 mm proximal left ureteral stone as well as a curvilinear calculus in the region of the left renal pelvis.   Acute on stage IIIb CKD with mild metabolic acidosis Improving.  Probably secondary to obstructive uropathy in the setting of sepsis and relative hypotension on admission. Patient creatinine peaked at 3. Currently at 1.42. repeat renal parameters in the morning.  Avoid nephrotoxins.    Hypocalcemia:  Corrected calcium levels are adequate.    Hypernatremia probably secondary to free water deficit from poor oral intake.  Encouraged her to take free water orally, sodium has improved from 148 to 143.  Continue to monitor.    Acute anemia superimposed on anemia of  chronic disease,  status post recent EGD showing gastritis and presbyesophagus.  Patient's baseline hemoglobin around 9 but admitted with a hemoglobin  of 7.9. S/p 1 unit of PRBC transfusion. Hemoglobin has been around 8 to 9.  Continue with IV Protonix twice daily Transfuse to keep hemoglobin greater than 7. No signs of bleeding evident.     Soft tissue fullness in the cecal area seen on the CT of the abdomen and pelvis.  Differential include mass versus stool. Ct abdomen and pelvis showed Lobulated mass in the cecum concerning for malignancy. Further evaluation with colonoscopy recommended. No bowel obstruction. 2. Left ureteral stent with mild left hydronephrosis. A 5 mm proximal left ureteral stone as well as a curvilinear calculus in the region of the left renal pelvis. 3. Sigmoid diverticulosis. 4. Cirrhosis.  GI on board and she underwent colonoscopy on 8/2 confirming large cecal mass concerning for malignancy.  gen surgery consulted for further recommendations.  No obstructive signs at this time.    Chronic combined systolic and diastolic heart failure She appears compensated.  Last echocardiogram on May 2021 showed left ventricular ejection fraction of 40% Continue to monitor urine output with strict intake and output and daily weights. No chest pain or sob on RA.     Chronic hypotension Continue with home midodrine. Well-controlled   ATRIAL fibrillation, S/P Biventricular Pacemaker:  On telemetry , paced.  Rate better .        DVT prophylaxis: SCDs Code Status: DNR Family Communication: discussed with daughter at bedside on8/2  Disposition:   Status is: Inpatient  Remains inpatient appropriate because:Ongoing diagnostic testing needed not appropriate for outpatient work up   Dispo: The patient is from: Home              Anticipated d/c is to: SNF              Anticipated d/c date is: 2 days              Patient currently is not medically stable to d/c.       Consultants:   Gastroenterology  Urology  Procedures: None  Antimicrobials: None   Subjective: Patient denies any nausea  vomiting or abdominal pain.   Objective: Vitals:   06/18/20 1336 06/18/20 1340 06/18/20 1350 06/18/20 1400  BP:  (!) 102/51  (!) 114/50  Pulse:  74 73 62  Resp:  17 (!) 23 (!) 26  Temp: (!) 97 F (36.1 C)     TempSrc: Oral     SpO2:  100% 99% 99%  Weight:      Height:        Intake/Output Summary (Last 24 hours) at 06/18/2020 1414 Last data filed at 06/18/2020 1338 Gross per 24 hour  Intake 1400 ml  Output --  Net 1400 ml   Filed Weights   06/16/20 0437 06/17/20 0540 06/18/20 0551  Weight: 88.4 kg 88.3 kg 89.1 kg    Examination:  General exam: Alert and comfortable no distress noted. Respiratory system: Air entry fair bilateral, no wheezing or rhonchi, no tachypnea Cardiovascular system: S1-S2 heard, regular, no JVD Gastrointestinal system: Abdomen is soft, nontender, nondistended, bowel sounds normal Central nervous system: Alert and oriented to person only, grossly nonfocal Extremities: No cyanosis or clubbing Skin: No rashes seen Psychiatry: Flat affect  Data Reviewed: I have personally reviewed following labs and imaging studies  CBC: Recent Labs  Lab 06/11/20 1929 06/12/20 0512 06/14/20 0514 06/15/20 0547 06/17/20 0938  WBC  --  8.0 6.4 6.8 6.0  NEUTROABS  --   --  4.3  --   --   HGB 7.9* 8.6* 8.7* 8.6* 9.2*  HCT 27.3* 29.6* 30.5* 29.8* 32.1*  MCV  --  86.8 88.2 87.9 87.7  PLT  --  174 215 209 962    Basic Metabolic Panel: Recent Labs  Lab 06/13/20 0530 06/14/20 0514 06/15/20 0547 06/16/20 0602 06/17/20 0938  NA 143 148* 147* 145 143  K 3.9 3.8 4.0 3.7 3.9  CL 116* 118* 117* 117* 115*  CO2 19* 20* 20* 21* 21*  GLUCOSE 104* 94 96 84 110*  BUN 46* 37* 28* 22 16  CREATININE 2.35* 1.74* 1.58* 1.37* 1.42*  CALCIUM 7.7* 7.9* 7.9* 7.8* 7.7*  MG  --  2.3  --   --   --     GFR: Estimated Creatinine Clearance: 26.8 mL/min (A) (by C-G formula based on SCr of 1.42 mg/dL (H)).  Liver Function Tests: Recent Labs  Lab 06/15/20 0547  AST 16  ALT  13  ALKPHOS 95  BILITOT 0.6  PROT 5.3*  ALBUMIN 2.2*    CBG: No results for input(s): GLUCAP in the last 168 hours.   Recent Results (from the past 240 hour(s))  Urine culture     Status: Abnormal   Collection Time: 06/11/20  1:23 AM   Specimen: Urine, Catheterized  Result Value Ref Range Status   Specimen Description   Final    URINE, CATHETERIZED Performed at [facility] [address] [place], [place] [address]    Special Requests   Final    NONE Performed at [facility], [place] [address], [place], [place] [address]    Culture (A)  Final    >=100,000 COLONIES/mL LACTOBACILLUS SPECIES Standardized susceptibility testing for this organism is not available. Performed at [facility], [place] [address] [place], [place] [address]    Report Status 06/12/2020 FINAL  Final  SARS Coronavirus 2 by RT PCR (hospital order, performed in [facility] hospital lab) Nasopharyngeal Nasopharyngeal Swab     Status: None   Collection Time: 06/11/20  4:25 AM   Specimen: Nasopharyngeal Swab  Result Value Ref Range Status   SARS Coronavirus 2 NEGATIVE NEGATIVE Final    Comment: (NOTE) SARS-CoV-2 target nucleic acids are NOT DETECTED.  The SARS-CoV-2 RNA is generally detectable in upper and lower respiratory specimens during the acute phase of infection. The lowest concentration of SARS-CoV-2 viral copies this assay can detect is 250 copies / mL. A negative result does not preclude SARS-CoV-2 infection and should not be used as the sole basis for treatment or other patient management decisions.  A negative result may occur with improper specimen collection / handling, submission of specimen other than nasopharyngeal swab, presence of viral mutation(s) within the areas targeted by this assay, and inadequate number of viral copies (<250 copies / mL). A negative result must be combined with clinical observations, patient history, and epidemiological  information.  Fact Sheet for Patients:   StrictlyIdeas.no  Fact Sheet for Healthcare Providers: BankingDealers.co.za  This test is not yet approved or  cleared by the [place] FDA and has been authorized for detection and/or diagnosis of SARS-CoV-2 by FDA under an Emergency Use Authorization (EUA).  This EUA will remain in effect (meaning this test can be used) for the duration of the COVID-19 declaration under Section 564(b)(1) of the Act, 21 U.S.C. section 360bbb-3(b)(1), unless the authorization is terminated or revoked sooner.  Performed at [facility], [place] [address], [place], [place] [address]   Urine Culture     Status: Abnormal   Collection Time: 06/11/20  7:22 PM   Specimen: Urine, Catheterized  Result Value Ref Range Status   Specimen Description   Final    URINE, CATHETERIZED Performed at [facility] [address] [place], [place] [address]    Special Requests   Final    NONE Performed at [facility] [place] [address], [place],  [address]    Culture (A)  Final    70,000 COLONIES/mL LACTOBACILLUS SPECIES Standardized susceptibility testing for this organism is not available. Performed at [facility], [place] [address], [place],  [address]    Report Status 06/13/2020 FINAL  Final         Radiology Studies: No results found.      Scheduled Meds: . [MAR Hold] amoxicillin  500 mg Oral Q12H  . [MAR Hold] Chlorhexidine Gluconate Cloth  6 each Topical Daily  . [MAR Hold] midodrine  5 mg Oral TID WC  . [MAR Hold] pantoprazole  40 mg Intravenous Q12H  . [MAR Hold] senna-docusate  2 tablet Oral BID  . [MAR Hold] sodium chloride flush  3 mL Intravenous Q12H   Continuous Infusions: . sodium chloride    . sodium chloride    . lactated ringers       LOS: 7 days        Hosie Poisson, MD [facility]   To contact  the attending provider between 7A-7P or the covering provider during after hours 7P-7A, please log into the web site [email or web address] and access using universal [place] password for that web site. If you do not have the password, please call the hospital operator.  06/18/2020, 2:14 PM

## 2020-06-18 NOTE — Op Note (Signed)
[facility] Patient Name: [name] Procedure Date: 06/18/2020 MRN: [id number] Attending MD: [name] , MD Date of Birth: [birth year] CSN: [id number] Age: 86 Admit Type: Inpatient Procedure:                Colonoscopy Indications:              Iron deficiency anemia, Abnormal CT of the GI tract Providers:                [name], MD, Clyde Lundborg, RN, Elspeth Cho Tech., Technician, Caryl Pina CRNA Referring MD:             hospital team Medicines:                Propofol per Anesthesia, Monitored Anesthesia Care Complications:            Scope trauma Estimated Blood Loss:     Estimated blood loss was minimal. Estimated blood                            loss was minimal. Procedure:                Pre-Anesthesia Assessment:                           - Prior to the procedure, a History and Physical                            was performed, and patient medications and                            allergies were reviewed. [name] tolerance of                            previous anesthesia was also reviewed. The risks                            and benefits of the procedure and the sedation                            options and risks were discussed with the patient.                            All questions were answered, and informed consent                            was obtained. Prior Anticoagulants: The patient has                            taken no previous anticoagulant or antiplatelet                            agents. ASA Grade Assessment: IV - A patient with  severe systemic disease that is a constant threat                            to life. After reviewing the risks and benefits,                            the patient was deemed in satisfactory condition to                            undergo the procedure.                           After obtaining informed consent, the  colonoscope                            was passed under direct vision. Throughout the                            procedure, the patient's blood pressure, pulse, and                            oxygen saturations were monitored continuously. The                            PCF-[id number] ([id number]) Olympus pediatric colonscope                            was introduced through the anus and advanced to the                            the cecum, identified by the ileocecal valve. The                            colonoscopy was performed with difficulty due to                            multiple diverticula in the colon, poor bowel prep,                            significant looping and a tortuous colon.                            Successful completion of the procedure was aided by                            straightening and shortening the scope to obtain                            bowel loop reduction and lavage. The patient                            tolerated the procedure fairly well. The quality of  the bowel preparation was poor. Scope In: 1:01:13 PM Scope Out: 1:27:00 PM Scope Withdrawal Time: 0 hours 12 minutes 32 seconds  Total Procedure Duration: 0 hours 25 minutes 47 seconds  Findings:      The perianal and digital rectal examinations were normal.      Multiple small and large-mouthed diverticula were found in the sigmoid       colon.      A benign-appearing, intrinsic moderate stenosis was found in the sigmoid       colon and was traversed.      A fungating and infiltrative partially obstructing large mass was found       in the cecum. Oozing was present. Biopsies were taken with a cold       forceps for histology. Area was tattooed with an injection of 2 mL of       Spot (carbon black). Unable to visualize appendiceal orifice due to mass       involving most of the cecum.      Internal hemorrhoids were found during retroflexion. The hemorrhoids       were  medium-sized and Grade I (internal hemorrhoids that do not       prolapse). Impression:               - Preparation of the colon was poor.                           - Diverticulosis in the sigmoid colon.                           - Stricture in the sigmoid colon. Suspect due to                            extensive diverticulosis.                           - Malignant partially obstructing tumor in the                            cecum. Biopsied. Tattooed.                           - Internal hemorrhoids. Moderate Sedation:      Not Applicable - Patient had care per Anesthesia. Recommendation:           - Clear liquid diet.                           - Await pathology results.                           - Refer to a Psychologist, sport and exercise. Procedure Code(s):        --- Professional ---                           [phone], Colonoscopy, flexible; with directed                            submucosal injection(s), any substance  45380, Colonoscopy, flexible; with biopsy, single                            or multiple Diagnosis Code(s):        --- Professional ---                           D50.9, Iron deficiency anemia, unspecified                           C18.0, Malignant neoplasm of cecum                           K56.690, Other partial intestinal obstruction                           K56.699, Other intestinal obstruction unspecified                            as to partial versus complete obstruction                           K64.0, First degree hemorrhoids                           K57.30, Diverticulosis of large intestine without                            perforation or abscess without bleeding                           R93.3, Abnormal findings on diagnostic imaging of                            other parts of digestive tract CPT copyright 2019 American Medical Association. All rights reserved. The codes documented in this report are preliminary and upon coder review may  be revised  to meet current compliance requirements. [name], MD 06/18/2020 1:56:33 PM This report has been signed electronically. Number of Addenda: 0

## 2020-06-18 NOTE — Anesthesia Preprocedure Evaluation (Addendum)
Anesthesia Evaluation  Patient identified by MRN, date of birth, ID band Patient awake    Reviewed: Allergy & Precautions, NPO status , Patient's Chart, lab work & pertinent test results  Airway Mallampati: III  TM Distance: >3 FB Neck ROM: Full    Dental  (+) Edentulous Upper, Edentulous Lower   Pulmonary neg pulmonary ROS, former smoker,    Pulmonary exam normal breath sounds clear to auscultation       Cardiovascular hypertension, +CHF  Normal cardiovascular exam+ dysrhythmias (no anticoagulation) Atrial Fibrillation + pacemaker + Valvular Problems/Murmurs (MR s/p repair 2003) MR  Rhythm:Regular Rate:Normal  TTE 2021 1. Left ventricular ejection fraction, by estimation, is 40%. The left ventricle has mildly decreased function. The left ventricle demonstrates global hypokinesis. The left ventricular internal cavity size was mildly dilated. Left ventricular diastolic parameters are indeterminate.  2. Right ventricular systolic function is moderately reduced. The right ventricular size is moderately enlarged. The estimated right ventricular  systolic pressure is 40.9 mmHg.  3. Left atrial size was severely dilated.  4. Right atrial size was severely dilated.  5. The mitral valve has been repaired/replaced. Mild mitral valve regurgitation. No evidence of mitral stenosis however Doppler alignment of diastolic gradient is slightly suboptimal. The mean mitral valve gradient  is 2.0 mmHg with average heart rate of70 bpm.  6. Tricuspid valve regurgitation is severe.  7. The aortic valve is tricuspid. Aortic valve regurgitation is mild. No aortic stenosis is present.  8. The inferior vena cava is dilated in size with <50% respiratory variability, suggesting right atrial pressure of 15 mmHg.    Neuro/Psych negative neurological ROS  negative psych ROS   GI/Hepatic GERD  Medicated,(+) Cirrhosis       ,   Endo/Other  negative  endocrine ROS  Renal/GU Renal InsufficiencyRenal disease (K 3.9, Cr 1.42)  negative genitourinary   Musculoskeletal negative musculoskeletal ROS (+)   Abdominal   Peds  Hematology  (+) Blood dyscrasia (Hgb 9.2), anemia ,   Anesthesia Other Findings   Reproductive/Obstetrics                            Anesthesia Physical Anesthesia Plan  ASA: IV  Anesthesia Plan: MAC   Post-op Pain Management:    Induction: Intravenous  PONV Risk Score and Plan: 2 and Propofol infusion and Treatment may vary due to age or medical condition  Airway Management Planned: Natural Airway  Additional Equipment:   Intra-op Plan:   Post-operative Plan:   Informed Consent: I have reviewed the patients History and Physical, chart, labs and discussed the procedure including the risks, benefits and alternatives for the proposed anesthesia with the patient or authorized representative who has indicated his/her understanding and acceptance.   Patient has DNR.  Discussed DNR with patient and Suspend DNR.   Dental advisory given  Plan Discussed with: CRNA  Anesthesia Plan Comments:         Anesthesia Quick Evaluation

## 2020-06-18 NOTE — TOC Progression Note (Signed)
Transition of Care [facility]) - Progression Note    Patient Details  Name: [name] MRN: [id number] Date of Birth: 09-Jan-1926  [facility] of [facility]) CM/SW Contact  Joaquin Courts, RN Phone Number: 06/18/2020, 1:21 PM  Clinical Narrative:   CM provided bed offers to patient's daughter Izora Gala, who initially selected [facility].  [facility] unfortunately does not have any open beds at this time.  Izora Gala selects [facility].  Facility rep was notified of selection.  Insurance auth initiated, Magazine features editor #[id number], supporting documents faxed to [place].  Patient has had her Covid Vaccine.  Patient will need an updated covid test for facility admission.     Expected Discharge Plan: [place] Barriers to Discharge: Continued Medical Work up  Expected Discharge Plan and Services Expected Discharge Plan: [place]   Discharge Planning Services: CM Consult Post Acute Care Choice: [place] Living arrangements for the past 2 months: [place]                                       Social Determinants of Health (SDOH) Interventions    Readmission Risk Interventions No flowsheet data found.

## 2020-06-18 NOTE — Progress Notes (Signed)
Physical Therapy Treatment Patient Details Name: [name] MRN: [id number] DOB: [birth year] Today's Date: 06/18/2020    History of Present Illness 86 yo female admitted with UTI SIRS. S/P cystoscopy, L double J stent placement 06/11/20. Hx of Afib, CKD, anemia, CHF, NSVT, pacemaker, chronic back pain, spinal stenosis, orthostatic hypotension    PT Comments    Pt assisted to [facility] and then ambulated around room.  Pt reports generally not feeling well and requested return to bed.  Continue to recommend SNF upon d/c.  Follow Up Recommendations  SNF;Supervision/Assistance - 24 hour     Equipment Recommendations  None recommended by PT    Recommendations for Other Services       Precautions / Restrictions Precautions Precautions: Fall    Mobility  Bed Mobility Overal bed mobility: Needs Assistance Bed Mobility: Supine to Sit;Sit to Supine     Supine to sit: Supervision Sit to supine: Supervision   General bed mobility comments: increased time  Transfers Overall transfer level: Needs assistance Equipment used: Rolling walker (2 wheeled) Transfers: Sit to/from [facility] Sit to Stand: Min guard Stand pivot transfers: Min guard       General transfer comment: min/guard for safety, cues for hand placement; pt first transferred to [facility] and then felt too tired and "not feeling well" enough to ambulate  Ambulation/Gait Ambulation/Gait assistance: Min guard Gait Distance (Feet): 9 Feet Assistive device: Rolling walker (2 wheeled) Gait Pattern/deviations: Step-through pattern;Decreased stride length;Trunk flexed     General Gait Details: verbal cues for RW positioning, pt ambulated around bed to recliner; recliner not comfortable so pt assisted back to bed   Stairs             Wheelchair Mobility    Modified Rankin (Stroke Patients Only)       Balance                                            Cognition  Arousal/Alertness: Awake/alert Behavior During Therapy: Flat affect Overall Cognitive Status: Within Functional Limits for tasks assessed                                 General Comments: following simple commands      Exercises      General Comments        Pertinent Vitals/Pain Pain Assessment: No/denies pain Pain Intervention(s): Monitored during session;Repositioned    Home Living                      Prior Function            PT Goals (current goals can now be found in the care plan section) Progress towards PT goals: Progressing toward goals    Frequency    Min 3X/week      PT Plan Current plan remains appropriate    Co-evaluation              AM-PAC PT "6 Clicks" Mobility   Outcome Measure  Help needed turning from your back to your side while in a flat bed without using bedrails?: A Little Help needed moving from lying on your back to sitting on the side of a flat bed without using bedrails?: A Little Help needed moving to and from a bed to a chair (including  a wheelchair)?: A Little Help needed standing up from a chair using your arms (e.g., wheelchair or bedside chair)?: A Little Help needed to walk in hospital room?: A Little Help needed climbing 3-5 steps with a railing? : A Lot 6 Click Score: 17    End of Session Equipment Utilized During Treatment: Gait belt Activity Tolerance: Patient limited by fatigue Patient left: in bed;with call bell/phone within reach   PT Visit Diagnosis: Muscle weakness (generalized) (M62.81);Difficulty in walking, not elsewhere classified (R26.2)     Time: 3437-3578 PT Time Calculation (min) (ACUTE ONLY): 17 min  Charges:  $Gait Training: 8-22 mins                     Arlyce Dice, DPT Acute Rehabilitation Services Pager: [phone] Office: [phone]  York Ram E 06/18/2020, 1:38 PM

## 2020-06-18 NOTE — Interval H&P Note (Signed)
History and Physical Interval Note:  06/18/2020 11:58 AM  [name]  has presented today for surgery, with the diagnosis of Abnormal CT anemia guaiac positive.  The various methods of treatment have been discussed with the patient and family. After consideration of risks, benefits and other options for treatment, the patient has consented to  Procedure(s) with comments: COLONOSCOPY (N/A) - Either to be done by Dr. Michail Sermon or Dr. Paulita Fujita as a surgical intervention.  [name] history has been reviewed, patient examined, no change in status, stable for surgery.  I have reviewed the patient's chart and labs.  Questions were answered to the patient's satisfaction.     [name]

## 2020-06-18 NOTE — Anesthesia Postprocedure Evaluation (Signed)
Anesthesia Post Note  Patient: [name]  Procedure(s) Performed: COLONOSCOPY (N/A ) SUBMUCOSAL TATTOO INJECTION BIOPSY     Patient location during evaluation: Endoscopy Anesthesia Type: MAC Level of consciousness: awake and alert Pain management: pain level controlled Vital Signs Assessment: post-procedure vital signs reviewed and stable Respiratory status: spontaneous breathing, nonlabored ventilation, respiratory function stable and patient connected to nasal cannula oxygen Cardiovascular status: blood pressure returned to baseline and stable Postop Assessment: no apparent nausea or vomiting Anesthetic complications: no   No complications documented.  Last Vitals:  Vitals:   06/18/20 1350 06/18/20 1400  BP:  (!) 114/50  Pulse: 73 62  Resp: (!) 23 (!) 26  Temp:    SpO2: 99% 99%    Last Pain:  Vitals:   06/18/20 1400  TempSrc:   PainSc: 0-No pain                 [name]

## 2020-06-18 NOTE — Consult Note (Addendum)
[facility] Surgery Consult Note  Aaliyana Fredericks Scarantino 16-Apr-1926  [id number].    Requesting MD:  Chief Complaint: 86 y/o with abdominal pain, black stools, worsening pallor and confusion Reason for Consult: Cecal mass  HPI:  Patient is a 86 year old female is at assisted living at [facility] while her daughter was on vacation, but normally at home with daughter.  She has a history of tacky-bradycardia syndrome, she has a BiV pacemaker from 2007, severe MR with prior repair.  Hx NSVT, chronic atrial fibrillation, not on anticoagulation secondary to history of GI bleed.  Hx of gastritis recent balloon dilatation [name], pulmonary hypertension, CKD, Hx spinal stenosis.  Patient presented to the ED on 06/11/2020 with worsening confusion, pallor, multiple medical issues.  She was hospitalized 5/19-5/21/21, with syncope, anemia, and CKD. She was to follow up as an OP for occult GI source for her anemia.  Her last colonoscopy was in 2016 in [place].    Work up in the ED here showed: CMP shows a chloride of 115, potassium 3.5, glucose 118, creatinine 3.16, calcium 7.6, albumin 2.4, total protein 5.7, ammonia 43, BNP 421.  WBC 10.7, hemoglobin 7.9, hematocrit 27.9, platelets 189,000.  Urinalysis showed greater than 50 white cells per high-powered field.  Fecal occult stool is positive x2.  Urine culture positive for lactobacillus.  CT of the abdomen the pelvis without IV contrast contrast showed moderate hydronephrosis on the left there is a calculi in the mid the ureteral region on the left.  A calculus at the level 4 measuring 7.5 mm with another adjacent 2 mm calculus.  There is intrarenal calculi bilaterally probable hyperdense cyst in the right kidney.  There is a soft tissue fullness in the cecal region that might represent stool but gave a masslike appearance.  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  A degree of hepatic cirrhosis, gallbladder is absent.   Spinal stenosis at L2-3 and L3-4.  She was seen by Urology and underwent left ureteral stent placement by Dr. Jeffie Pollock. She was also seen by GI.  Patient was set up for colonoscopy which was completed today.  Colonoscopy shows multiple large and small mouth diverticuli found in the sigmoid colon.  A benign-appearing intrinsic moderate stenosis was found in the sigmoid colon.  This was traversed and a fungating, infiltrative partially obstructing mass was found in the cecum.  Oozing was present biopsies were obtained.  Areas tattooed with injection of carbon black.  The appendiceal orifice was not visualized.  It was Dr. 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  We are asked to see.   ROS: Review of Systems  Constitutional: Positive for weight loss (10-12 pounds last couple months).       Came in with hypotension  HENT: Positive for hearing loss (she thinks it 's mostly right ear).   Eyes: Negative.   Respiratory: Positive for shortness of breath (she can walk about 15 feet per her daughter before she becomes tired, she blames her back). Negative for cough, hemoptysis, sputum production and wheezing.   Cardiovascular: Positive for orthopnea (she doesn't like to lie down all the way) and leg swelling (on and off at times). Negative for PND.  Gastrointestinal: Positive for blood in stool and heartburn. Negative for abdominal pain, constipation, diarrhea, melena, nausea and vomiting.  Genitourinary: Negative.   Musculoskeletal: Positive for back pain.  Skin: Negative.   Neurological: Negative.  Endo/Heme/Allergies: Bruises/bleeds easily.  Psychiatric/Behavioral: Negative.        Pt had some confusion with admit, but not at home and now better after RX of UTI.    Family History  Problem Relation Age of Onset  . Pancreatic cancer Father   . Other Mother        Mother died at 79 with no real medical problems     Past Medical History:  Diagnosis Date  . Anemia   . Brady-tachy syndrome ([place])    a. 2003: post-op afib after MR repair then symptomatic bradycardia requiring pacemaker. b. Upgrade to [employer] Bi-V Pacemaker 2007.  . Cellulitis and abscess of foot 08/15/2015   RT FOOT  . Chronic atrial fibrillation ([place])    a. Previously failed DCCV/amio/tikosyn.  CHADS2VASC score 5 - not on anticoagulation due to history of GI bleeding.    . Chronic combined systolic and diastolic CHF (congestive heart failure) (HCC)    a. EF 35-40% on echo 09/2013 b. echo 04/2016: EF 50-55% with mild to moderate MS.  . Cirrhosis ([place])    a. Newly recognized 09/2013 - by CT.  . CKD (chronic kidney disease)   . GI bleeding   . Hypertension   . Mitral valve disorder    a. Severe MR s/p repair 2003 (28 mm annuloplasty ring and and oversew of LAA). No CAD by cath at that time.  [name] NSVT (nonsustained ventricular tachycardia) ([place])    a. Isolated event during 09/2007 adm.  . NSVT (nonsustained ventricular tachycardia) ([place])   . Orthostatic hypotension dysautonomic syndrome ([place])   . Pulmonary hypertension (HCC)    Group II secondary to CHF and MV disease  . Spinal stenosis    shes recieved epidural steroid injections in the past  . Tricuspid regurgitation     Past Surgical History:  Procedure Laterality Date  . APPENDECTOMY    . BALLOON DILATION N/A 06/05/2020   Procedure: BALLOON DILATION;  Surgeon: Otis Brace, MD;  Location: [facility] ENDOSCOPY;  Service: Gastroenterology;  Laterality: N/A;  . BIOPSY  06/05/2020   Procedure: BIOPSY;  Surgeon: Otis Brace, MD;  Location: [facility] ENDOSCOPY;  Service: Gastroenterology;;  . BIV PACEMAKER GENERATOR CHANGE OUT N/A 12/13/2014   Procedure: BIV PACEMAKER GENERATOR CHANGE OUT;  Surgeon: Evans Lance, MD;  Location: [facility] CATH LAB;  Service: Cardiovascular;  Laterality: N/A;  . CHOLECYSTECTOMY    . CYSTOSCOPY WITH STENT PLACEMENT Left 06/11/2020   Procedure: CYSTOSCOPY  WITH STENT PLACEMENT;  Surgeon: Irine Seal, MD;  Location: [facility] ORS;  Service: Urology;  Laterality: Left;  . ESOPHAGOGASTRODUODENOSCOPY (EGD) WITH PROPOFOL N/A 06/05/2020   Procedure: ESOPHAGOGASTRODUODENOSCOPY (EGD) WITH PROPOFOL;  Surgeon: Otis Brace, MD;  Location: [facility]OS[facility];  Service: Gastroenterology;  Laterality: N/A;  . IRRIGATION AND DEBRIDEMENT ABSCESS N/A 10/14/2013   Procedure: IRRIGATION AND DEBRIDEMENT PERINEAL ABSCESS;  Surgeon: Rolm Bookbinder, MD;  Location: [facility];  Service: General;  Laterality: N/A;  . VALVE REPLACEMENT     sever mitral regurgitation s/p mitral valve annuloplasty ring    Social History:  reports that she quit smoking about 48 years ago. She has never used smokeless tobacco. She reports current alcohol use. She reports that she does not use drugs.  Allergies: No Known Allergies  Medications Prior to Admission  Medication Sig Dispense Refill  . Febuxostat 80 MG TABS Take 80 mg by mouth daily.    . ferrous sulfate 325 (65 FE) MG tablet Take 1 tablet (325 mg total) by mouth 2 (two) times  daily with a meal. 60 tablet 6  . midodrine (PROAMATINE) 5 MG tablet Take 1 tablet (5 mg total) by mouth 3 (three) times daily with meals. 270 tablet 3  . pantoprazole (PROTONIX) 40 MG tablet Take 40 mg by mouth every morning.    . potassium chloride SA (KLOR-CON) 20 MEQ tablet Take 1 tablet (20 mEq total) by mouth daily. 180 tablet 2  . torsemide (DEMADEX) 20 MG tablet Take 1 tablet (20 mg total) by mouth daily. 90 tablet 3  . cetirizine (ZYRTEC) 10 MG tablet Take 10 mg by mouth daily.    . Multiple Vitamins-Minerals (OCUVITE ADULT 50+) CAPS Take 1 capsule by mouth daily.      Blood pressure (!) 125/53, pulse 70, temperature 97.6 F (36.4 C), temperature source Oral, resp. rate (!) 24, height 5\' 2"  (1.575 m), weight 89.1 kg, last menstrual period 10/10/2013, SpO2 98 %. Physical Exam:  General: pleasant, WD, WN white female who is laying in bed in NAD.  She says  she has some discomfort in her bottom as the best description.  HEENT: head is normocephalic, atraumatic.  Sclera are noninjected.  PERRL.  Ears and nose without any masses or lesions. She is a bit hard of hearing.  Mouth is pink and moist, she has bitten her tongue and is still visible.  Heart: regular, rate, and rhythm.  Normal s1,s2. No obvious murmurs, gallops, or rubs noted.  midsternotomy incision well healed. Palpable radial and pedal pulses bilaterally. Lungs: CTAB, no wheezes, rhonchi, or rales noted.  Respiratory effort nonlabored Abd: soft, NT, ND, +BS, no masses, hernias, or organomegaly.  BMI 36 MS: all 4 extremities are symmetrical with no cyanosis, clubbing, or edema. Skin: warm and dry with no masses, lesions, or rashes Neuro: Cranial nerves 2-12 grossly intact, sensation is normal throughout Psych: A&Ox3 with an appropriate affect. She has some memory issues but nothing significant.   Results for orders placed or performed during the hospital encounter of 06/11/20 (from the past 48 hour(s))  CBC     Status: Abnormal   Collection Time: 06/17/20  9:38 AM  Result Value Ref Range   WBC 6.0 4.0 - 10.5 K/uL   RBC 3.66 (L) 3.87 - 5.11 MIL/uL   Hemoglobin 9.2 (L) 12.0 - 15.0 g/dL   HCT 32.1 (L) 36 - 46 %   MCV 87.7 80.0 - 100.0 fL   MCH 25.1 (L) 26.0 - 34.0 pg   MCHC 28.7 (L) 30.0 - 36.0 g/dL   RDW 17.7 (H) 11.5 - 15.5 %   Platelets 220 150 - 400 K/uL   nRBC 0.0 0.0 - 0.2 %    Comment: Performed at [facility], [place] [address], [place], [place] [address]  Basic metabolic panel     Status: Abnormal   Collection Time: 06/17/20  9:38 AM  Result Value Ref Range   Sodium 143 135 - 145 mmol/L   Potassium 3.9 3.5 - 5.1 mmol/L   Chloride 115 (H) 98 - 111 mmol/L   CO2 21 (L) 22 - 32 mmol/L   Glucose, Bld 110 (H) 70 - 99 mg/dL    Comment: Glucose reference range applies only to samples taken after fasting for at least 8 hours.   BUN 16 8 - 23 mg/dL   Creatinine,  Ser 1.42 (H) 0.44 - 1.00 mg/dL   Calcium 7.7 (L) 8.9 - 10.3 mg/dL   GFR calc non Af Amer 32 (L) >60 mL/min   GFR calc Af Wyvonnia Lora  37 (L) >60 mL/min   Anion gap 7 5 - 15    Comment: Performed at [facility], [place] [address] [place], [place] [address]   No results found.  . lactated ringers      Assessment/Plan SIRS secondary to UTI/GI bleed  Tachy-Brady syndrome  - PTVP 11/2014 Severe MR s/p repair with 28 mm annuloplasty ring/oversew LAA, 2003 Hx NSVT/chronic atrial fibrillation - not on anticoagulation secondary to GI bleed Hx CHF Hx pulmonary hypertension Hx anemia Hx CKD with AKI Hx gastritis Hx recent balloon dilatation [name] Hx spinal stenosis Hx hypertension/hypertension -on midodrine at home Hydronephrosis with cystoscopy/left retrograde pyelogram/insertion of left double-J stent 06/11/2020; Dr. Jeffie Pollock Hx remote tobacco use  Cecal Mass - BX pending - colonoscopy 06/18/20, Dr. Michail Sermon - path pending  FEN: IV fluids/clear liquids ID: Rocephin 7/26-7/28;  amoxicillin 7/29 >>Day 5 DVT:  SCD's  Follow-up: TBD   Plan:  We will need to get her pathology back before making any final decisions. She will need cardiology consult tomorrow to talk about anesthesia/surgical risks. Her daughter is with her now, and her main concern is keeping her mother comfortable. At this point there is no work up of her chest aside from a [place]. We also discussed also asking Palliative to see and discuss her options once we have more information also.  I have ordered a CEA, INR, and prealbumin for tomorrow AM.   [facility] 06/18/2020, 2:59 PM Please see Amion for pager number during day hours 7:00am-4:30pm

## 2020-06-18 NOTE — Care Management Important Message (Signed)
Important Message  Patient Details IM Letter given to the Patient Name: [name] MRN: [id number] Date of Birth: 10/28/1926   Medicare Important Message Given:  Yes     Kerin Salen 06/18/2020, 10:40 AM

## 2020-06-18 NOTE — Transfer of Care (Signed)
Immediate Anesthesia Transfer of Care Note  Patient: [name]  Procedure(s) Performed: COLONOSCOPY (N/A ) SUBMUCOSAL TATTOO INJECTION BIOPSY  Patient Location: Endoscopy Unit  Anesthesia Type:MAC  Level of Consciousness: drowsy and responds to stimulation  Airway & Oxygen Therapy: Patient Spontanous Breathing and Patient connected to face mask oxygen  Post-op Assessment: Report given to RN and Post -op Vital signs reviewed and stable  Post vital signs: Reviewed and stable  Last Vitals:  Vitals Value Taken Time  BP 98/36 06/18/20 1335  Temp    Pulse 72 06/18/20 1338  Resp 16 06/18/20 1338  SpO2 100 % 06/18/20 1338  Vitals shown include unvalidated device data.  Last Pain:  Vitals:   06/18/20 1335  TempSrc:   PainSc: 0-No pain      Patients Stated Pain Goal: 2 (34/91/79 1505)  Complications: No complications documented.

## 2020-06-19 ENCOUNTER — Encounter (HOSPITAL_COMMUNITY): Payer: Self-pay | Admitting: Internal Medicine

## 2020-06-19 DIAGNOSIS — N1831 Chronic kidney disease, stage 3a: Secondary | ICD-10-CM | POA: Diagnosis not present

## 2020-06-19 DIAGNOSIS — Z9889 Other specified postprocedural states: Secondary | ICD-10-CM

## 2020-06-19 DIAGNOSIS — I5032 Chronic diastolic (congestive) heart failure: Secondary | ICD-10-CM | POA: Diagnosis not present

## 2020-06-19 DIAGNOSIS — I4821 Permanent atrial fibrillation: Secondary | ICD-10-CM | POA: Diagnosis not present

## 2020-06-19 DIAGNOSIS — G903 Multi-system degeneration of the autonomic nervous system: Secondary | ICD-10-CM | POA: Diagnosis not present

## 2020-06-19 LAB — PROTIME-INR
INR: 1.2 (ref 0.8–1.2)
Prothrombin Time: 14.6 seconds (ref 11.4–15.2)

## 2020-06-19 LAB — PREALBUMIN: Prealbumin: 11 mg/dL — ABNORMAL LOW (ref 18–38)

## 2020-06-19 MED ORDER — PANTOPRAZOLE SODIUM 40 MG PO TBEC
40.0000 mg | DELAYED_RELEASE_TABLET | Freq: Two times a day (BID) | ORAL | Status: DC
  Administered 2020-06-19 – 2020-06-23 (×7): 40 mg via ORAL
  Filled 2020-06-19 (×7): qty 1

## 2020-06-19 MED ORDER — BOOST / RESOURCE BREEZE PO LIQD CUSTOM
1.0000 | Freq: Three times a day (TID) | ORAL | Status: DC
  Administered 2020-06-19 – 2020-07-14 (×52): 1 via ORAL

## 2020-06-19 NOTE — Progress Notes (Signed)
[facility] Progress Note  1 Day Post-Op  Subjective: CC-  Daughter, Santiago Glad, at bedside. Patient states that she feels fine this morning. She reports only mild soreness in her right upper abdomen. Denies n/v. Passing flatus.  CEA and surgical path pending. Cardiology and palliative medicine consults pending.  Objective: Vital signs in last 24 hours: Temp:  [97 F (36.1 C)-98.6 F (37 C)] 97.8 F (36.6 C) (08/03 0524) Pulse Rate:  [62-74] 68 (08/03 0524) Resp:  [14-26] 18 (08/03 0600) BP: (98-125)/(36-68) 103/54 (08/03 0524) SpO2:  [93 %-100 %] 98 % (08/03 0524) Weight:  [94.5 kg] 94.5 kg (08/03 0600) Last BM Date: 06/18/20  Intake/Output from previous day: 08/02 0701 - 08/03 0700 In: 1400 [I.V.:1400] Out: -  Intake/Output this shift: No intake/output data recorded.  PE: Gen:  Alert, NAD, pleasant HEENT: EOM's intact, pupils equal and round Card:  RRR Pulm:  CTAB, no W/R/R, rate and effort normal Abd: Soft, ND, NT, +BS, no HSM, no hernia Skin: no rashes noted, warm and dry  Lab Results:  Recent Labs    06/17/20 0938  WBC 6.0  HGB 9.2*  HCT 32.1*  PLT 220   BMET Recent Labs    06/17/20 0938  NA 143  K 3.9  CL 115*  CO2 21*  GLUCOSE 110*  BUN 16  CREATININE 1.42*  CALCIUM 7.7*   PT/INR Recent Labs    06/19/20 0537  LABPROT 14.6  INR 1.2   CMP     Component Value Date/Time   NA 143 06/17/2020 0938   NA 146 (H) 05/30/2019 1332   K 3.9 06/17/2020 0938   CL 115 (H) 06/17/2020 0938   CO2 21 (L) 06/17/2020 0938   GLUCOSE 110 (H) 06/17/2020 0938   BUN 16 06/17/2020 0938   BUN 25 05/30/2019 1332   CREATININE 1.42 (H) 06/17/2020 0938   CREATININE 1.49 (H) 07/30/2016 1509   CALCIUM 7.7 (L) 06/17/2020 0938   PROT 5.3 (L) 06/15/2020 0547   ALBUMIN 2.2 (L) 06/15/2020 0547   AST 16 06/15/2020 0547   ALT 13 06/15/2020 0547   ALKPHOS 95 06/15/2020 0547   BILITOT 0.6 06/15/2020 0547   GFRNONAA 32 (L) 06/17/2020 0938   GFRAA 37 (L)  06/17/2020 0938   Lipase     Component Value Date/Time   LIPASE 27 06/11/2020 0123       Studies/Results: No results found.  Anti-infectives: Anti-infectives (From admission, onward)   Start     Dose/Rate Route Frequency Ordered Stop   06/14/20 1000  amoxicillin (AMOXIL) capsule 500 mg     Discontinue     500 mg Oral Every 12 hours 06/14/20 0741 06/19/20 0959   06/11/20 1030  cefTRIAXone (ROCEPHIN) 1 g in sodium chloride 0.9 % 100 mL IVPB  Status:  Discontinued        1 g 200 mL/hr over 30 Minutes Intravenous Every 24 hours 06/11/20 1021 06/14/20 0741   06/11/20 0300  cefTRIAXone (ROCEPHIN) 1 g in sodium chloride 0.9 % 100 mL IVPB        1 g 200 mL/hr over 30 Minutes Intravenous  Once 06/11/20 0259 06/11/20 0458       Assessment/Plan SIRS secondary to UTI/GI bleed  Tachy-Brady syndrome  - PTVP 11/2014 Severe MR s/p repair with 28 mm annuloplasty ring/oversew LAA, 2003 Hx NSVT/chronic atrial fibrillation - not on anticoagulation secondary to GI bleed Hx CHF Hx pulmonary hypertension Hx anemia Hx CKD with AKI Hx gastritis Hx recent balloon dilatation  [name] Hx spinal stenosis Hx hypertension/hypertension -on midodrine at home Hydronephrosis with cystoscopy/left retrograde pyelogram/insertion of left double-J stent 06/11/2020; Dr. Jeffie Pollock Hx remote tobacco use Malnutrition - prealbumin 11 (8/3)  Cecal Mass - BX pending - colonoscopy 06/18/20, Dr. Michail Sermon - path pending - CEA pending  FEN: IV fluids/clear liquids, Boost ID: Rocephin 7/26-7/28;  amoxicillin 7/29 >>Day #6 DVT:  SCD's  Follow-up: TBD   Plan:  Continue clear liquids today, add Boost breeze. Cardiology and palliative medicine consults pending. Will determine surgical plan once specialty consults complete and pathology is back.   LOS: 8 days    Wellington Hampshire, [facility] 06/19/2020, 9:11 AM Please see Amion for pager number during day hours 7:00am-4:30pm

## 2020-06-19 NOTE — Progress Notes (Signed)
Occupational Therapy Progress Note  Patient agreeable to sink side ADLs. Patient require cues for safety with hand placement and management of walker at sink side at min G assist. Patient tolerate standing for approx 5 mins before needing to sit to apply deodorant due to fatigue and back pain. Mod cues for sequencing scooting up in bed from supine and max cues for pursed lip breathing technique with poor carry over. Will continue to follow.    06/19/20 1000  OT Visit Information  Last OT Received On 06/19/20  Assistance Needed +1  History of Present Illness 86 yo female admitted with UTI SIRS. S/P cystoscopy, L double J stent placement 06/11/20. Hx of Afib, CKD, anemia, CHF, NSVT, pacemaker, chronic back pain, spinal stenosis, orthostatic hypotension  Precautions  Precautions Fall  Pain Assessment  Pain Assessment Faces  Faces Pain Scale 4  Pain Location back, neck  Pain Descriptors / Indicators Sore;Aching  Pain Intervention(s) Repositioned;Monitored during session  Cognition  Arousal/Alertness Awake/alert  Behavior During Therapy WFL for tasks assessed/performed  Overall Cognitive Status Within Functional Limits for tasks assessed  ADL  Overall ADL's  Needs assistance/impaired  Grooming Oral care;Wash/dry face;Wash/dry hands;Min guard;Standing;Applying deodorant  Grooming Details (indicate cue type and reason) due to back pain + decreased standing tolerance had to complete applying deoderant in sitting, leans heavily onto sink for support  Toilet Transfer Min guard;Cueing for safety;Ambulation;BSC;RW  Toilet Transfer Details (indicate cue type and reason) simulated with functional mobility, cues for hand placement and safety with walker at sink side   Functional mobility during ADLs Min guard;Rolling walker;Cueing for safety  Bed Mobility  Overal bed mobility Needs Assistance  Bed Mobility Supine to Sit;Sit to Supine  Supine to sit Supervision  Sit to supine Supervision  General  bed mobility comments cues for sequencing scooting herself up in bed  Balance  Overall balance assessment Needs assistance  Sitting-balance support Feet supported  Sitting balance-Leahy Scale Good  Standing balance support Bilateral upper extremity supported  Standing balance-Leahy Scale Poor  Standing balance comment reliant on external support  Transfers  Overall transfer level Needs assistance  Equipment used Rolling walker (2 wheeled)  Transfers Sit to/from Stand  Sit to Stand Min guard  General transfer comment min guard for safety with verbal cues for hand placement   General Comments  General comments (skin integrity, edema, etc.) max cues for pursed lip breathing with activity due to audible wheezing noted  OT - End of Session  Equipment Utilized During Treatment Rolling walker  Activity Tolerance Patient tolerated treatment well  Patient left in bed;with call bell/phone within reach;with bed alarm set;with family/visitor present;Other (comment) (declined chair reports its uncomfortable)  Nurse Communication Mobility status  OT Assessment/Plan  OT Plan Discharge plan remains appropriate  OT Visit Diagnosis Other abnormalities of gait and mobility (R26.89);Other symptoms and signs involving cognitive function  OT Frequency (ACUTE ONLY) Min 2X/week  Follow Up Recommendations SNF  OT Equipment None recommended by OT  AM-PAC OT "6 Clicks" Daily Activity Outcome Measure (Version 2)  Help from another person eating meals? 3  Help from another person taking care of personal grooming? 3  Help from another person toileting, which includes using toliet, bedpan, or urinal? 3  Help from another person bathing (including washing, rinsing, drying)? 3  Help from another person to put on and taking off regular upper body clothing? 3  Help from another person to put on and taking off regular lower body clothing? 3  6 Click Score  18  OT Goal Progression  Progress towards OT goals  Progressing toward goals  Acute Rehab OT Goals  Patient Stated Goal agreeable to brush teeth  OT Goal Formulation With patient  Time For Goal Achievement 06/27/20  Potential to Achieve Goals Good  ADL Goals  Pt Will Perform Grooming standing;with modified independence  Pt Will Perform Upper Body Dressing sitting;with modified independence  Pt Will Perform Lower Body Dressing with modified independence;sit to/from stand;sitting/lateral leans  Pt Will Transfer to Toilet with modified independence;ambulating  Additional ADL Goal #1 patient will demonstrate safe body mechanics during functional transfers in 2/3 trials with no verbal cues to minimize risk of falls,  OT Time Calculation  OT Start Time (ACUTE ONLY) 7371  OT Stop Time (ACUTE ONLY) 0845  OT Time Calculation (min) 17 min  OT General Charges  $OT Visit 1 Visit  OT Treatments  $Self Care/Home Management  8-22 mins   Delbert Phenix OT OT pager: [phone]

## 2020-06-19 NOTE — TOC Progression Note (Signed)
Transition of Care [facility]) - Progression Note    Patient Details  Name: [name] MRN: [id number] Date of Birth: 1926-11-07  [facility]) CM/SW Contact  Joaquin Courts, RN Phone Number: 06/19/2020, 11:26 AM  Clinical Narrative:  Authorization request withdrawn due to patient is not anticipated to be medically stable for dc for about a week according to MD, still awaiting pathology reports to determine surgical plan.  CM will re-initiate auth once patient is closer to medical stability.        Expected Discharge Plan: [place] to Discharge: Continued Medical Work up  Expected Discharge Plan and Services Expected Discharge Plan: [place]   Discharge Planning Services: CM Consult Post Acute Care Choice: [place] Living arrangements for the past 2 months: [place]                                       Social Determinants of Health (SDOH) Interventions    Readmission Risk Interventions No flowsheet data found.

## 2020-06-19 NOTE — Consult Note (Signed)
Cardiology Consultation:   Patient ID: [name] MRN: [id number]; DOB: 05/12/1926  Admit date: 06/11/2020 Date of Consult: 06/19/2020  Primary Care Provider: [name], MD [facility] Cardiologist: [name], MD  [facility] Electrophysiologist:  None    Patient Profile:   [name] is a 86 y.o. female with a hx of chronic combined systolic/diastolic CHF, tach brady with BIV PPM, severe MR with MV repair in 2003, chronic a fib not on anticoagulation due to GI bleed, Hypotension on midodrine and moderate pulmonary HTN who is being seen today for the evaluation of pre-op eval for possible surgery for cecal mass at the request of Dr Karleen Hampshire.  History of Present Illness:   [name] with hx of chronic combined systolic/diastolic CHF 4401 EF 02-72%, tachy brady with BiV PPM, severe MR with MV repair in 2003 and mild to moderate MS on echo 2017.  Chronic a fib and not on anticoagulation due to GIB.  Hypotension on midodrine, moderate pul. HTN.  In addition with severe TR but no plans for further work up due to advanced age.  CKD-3.  Hx of episodic NSVT.  Most recent echo 04/05/20 with EF 40%, LV with mildly decreased function, LV demonstrates global hypokinesis.  RV systolic function moderately reduced.  RV is moderately enlarged, both Rt and Lt atrium severely dilated.  She has mild mitral valve regurgitation.  The mean mitral valve gradient is 2.0 mmHg with average heart rate of 70 bpm.  Severe TR.  Mild AR.    Last BIV PPM check 03/14/20 and stable device.    Pt admitted 06/11/20 with UTI, sepsis and dark stools.  The dark stools for past several weeks.   Hgb 7.9 and transfused 1 unit PRBCs.  Found to have Lt ureteral stones with hydronephorosis  And cysto and stent placement on 06/11/20.  Urine culture showed lactobacillus transition IV Rocephin to oral amoxicillin for another 2 weeks to complete the course.  On CT of abd she was found to have soft tissue fullness in cecal region.   With repeat Ct exam cecal mass seen concerning for malignancy.  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  Biopsied.  EGD with gastritis and presbyesophagus.   Surgical consult obtained.  Awaiting pathology.   Pt's daughter wishes to keep her mother comfortable.    EKG:  The EKG was personally reviewed and demonstrates:  A fib with V (biV) pacing  No acute sT changes compared to 04/05/20  Telemetry:  Telemetry was personally reviewed and demonstrates:  Mostly V pacing, underlying a fib.  On 06/17/20 10 beats of NSVT.    Labs 06/17/20  Na 143, K+ 3.9, C02 21, BUN 16, Cr 1.42  Hgb 9.2, WBC 6, plts 220   Pre-albumin 11  BNP on admit 421 COVID negative  BP 103.54 P 68  Afebrile.  Currently she is sitting up in bed.  She has had no chest pain now or prior to admit.  No SOB.    Past Medical History:  Diagnosis Date  . Anemia   . Brady-tachy syndrome ([place])    a. 2003: post-op afib after MR repair then symptomatic bradycardia requiring pacemaker. b. Upgrade to Medtronic Bi-V Pacemaker 2007.  . Cellulitis and abscess of foot 08/15/2015   RT FOOT  . Chronic atrial fibrillation ([place])    a. Previously failed DCCV/amio/tikosyn.  CHADS2VASC score 5 - not on anticoagulation due to history of GI bleeding.    . Chronic combined  systolic and diastolic CHF (congestive heart failure) ([place])    a. EF 35-40% on echo 09/2013 b. echo 04/2016: EF 50-55% with mild to moderate MS.  . Cirrhosis ([place])    a. Newly recognized 09/2013 - by CT.  . CKD (chronic kidney disease)   . GI bleeding   . Hypertension   . Mitral valve disorder    a. Severe MR s/p repair 2003 (28 mm annuloplasty ring and and oversew of LAA). No CAD by cath at that time.  [name] NSVT (nonsustained ventricular tachycardia) ([place])    a. Isolated event during 09/2007 adm.  . NSVT (nonsustained ventricular tachycardia) ([place])   . Orthostatic hypotension dysautonomic syndrome ([place])   . Pulmonary hypertension ([facility])    Group  II secondary to CHF and MV disease  . Spinal stenosis    shes recieved epidural steroid injections in the past  . Tricuspid regurgitation     Past Surgical History:  Procedure Laterality Date  . APPENDECTOMY    . BALLOON DILATION N/A 06/05/2020   Procedure: BALLOON DILATION;  Surgeon: Otis Brace, MD;  Location: [facility];  Service: Gastroenterology;  Laterality: N/A;  . BIOPSY  06/05/2020   Procedure: BIOPSY;  Surgeon: Otis Brace, MD;  Location: [facility];  Service: Gastroenterology;;  . BIV PACEMAKER GENERATOR CHANGE OUT N/A 12/13/2014   Procedure: BIV PACEMAKER GENERATOR CHANGE OUT;  Surgeon: Evans Lance, MD;  Location: [facility] CATH LAB;  Service: Cardiovascular;  Laterality: N/A;  . CHOLECYSTECTOMY    . CYSTOSCOPY WITH STENT PLACEMENT Left 06/11/2020   Procedure: CYSTOSCOPY WITH STENT PLACEMENT;  Surgeon: Irine Seal, MD;  Location: [facility];  Service: Urology;  Laterality: Left;  . ESOPHAGOGASTRODUODENOSCOPY (EGD) WITH PROPOFOL N/A 06/05/2020   Procedure: ESOPHAGOGASTRODUODENOSCOPY (EGD) WITH PROPOFOL;  Surgeon: Otis Brace, MD;  Location: [facility];  Service: Gastroenterology;  Laterality: N/A;  . IRRIGATION AND DEBRIDEMENT ABSCESS N/A 10/14/2013   Procedure: IRRIGATION AND DEBRIDEMENT PERINEAL ABSCESS;  Surgeon: Rolm Bookbinder, MD;  Location: [facility];  Service: General;  Laterality: N/A;  . VALVE REPLACEMENT     sever mitral regurgitation s/p mitral valve annuloplasty ring     Home Medications:  Prior to Admission medications   Medication Sig Start Date End Date Taking? Authorizing Provider  Febuxostat 80 MG TABS Take 80 mg by mouth daily.   Yes [provider]  ferrous sulfate 325 (65 FE) MG tablet Take 1 tablet (325 mg total) by mouth 2 (two) times daily with a meal. 05/22/16  Yes Strader, [place] M, PA-C  midodrine (PROAMATINE) 5 MG tablet Take 1 tablet (5 mg total) by mouth 3 (three) times daily with meals. 05/23/20  Yes Turner, Eber Hong, MD    pantoprazole (PROTONIX) 40 MG tablet Take 40 mg by mouth every morning. 05/30/20  Yes [provider]  potassium chloride SA (KLOR-CON) 20 MEQ tablet Take 1 tablet (20 mEq total) by mouth daily. 04/07/20  Yes Vann, Jessica U, DO  torsemide (DEMADEX) 20 MG tablet Take 1 tablet (20 mg total) by mouth daily. 04/26/20  Yes Turner, Eber Hong, MD  cetirizine (ZYRTEC) 10 MG tablet Take 10 mg by mouth daily.    [provider]  Multiple Vitamins-Minerals (OCUVITE ADULT 50+) CAPS Take 1 capsule by mouth daily.    [provider]    Inpatient Medications: Scheduled Meds: . amoxicillin  500 mg Oral Q12H  . Chlorhexidine Gluconate Cloth  6 each Topical Daily  . midodrine  5 mg Oral TID WC  . pantoprazole  40 mg Intravenous Q12H  . senna-docusate  2 tablet Oral BID  . sodium chloride flush  3 mL Intravenous Q12H   Continuous Infusions: . lactated ringers     PRN Meds: acetaminophen **OR** acetaminophen, acetaminophen, HYDROmorphone (DILAUDID) injection, metoprolol tartrate, ondansetron (ZOFRAN) IV, oxyCODONE, promethazine  Allergies:   No Known Allergies  Social History:   Social History   Socioeconomic History  . Marital status: Single    Spouse name: Not on file  . Number of children: Not on file  . Years of education: Not on file  . Highest education level: Not on file  Occupational History  . Not on file  Tobacco Use  . Smoking status: Former Smoker    Quit date: 10/11/1971    Years since quitting: 48.7  . Smokeless tobacco: Never Used  Substance and Sexual Activity  . Alcohol use: Yes    Comment: rare  . Drug use: No  . Sexual activity: Not on file  Other Topics Concern  . Not on file  Social History Narrative  . Not on file   Social Determinants of Health   Financial Resource Strain:   . Difficulty of Paying Living Expenses:   Food Insecurity:   . Worried About Charity fundraiser in the Last Year:   . Arboriculturist in the Last Year:    Transportation Needs:   . Film/video editor (Medical):   [name] Lack of Transportation (Non-Medical):   Physical Activity:   . Days of Exercise per Week:   . Minutes of Exercise per Session:   Stress:   . Feeling of Stress :   Social Connections:   . Frequency of Communication with Friends and Family:   . Frequency of Social Gatherings with Friends and Family:   . Attends Religious Services:   . Active Member of Clubs or Organizations:   . Attends Archivist Meetings:   [name] Marital Status:   Intimate Partner Violence:   . Fear of Current or Ex-Partner:   . Emotionally Abused:   [name] Physically Abused:   . Sexually Abused:     Family History:    Family History  Problem Relation Age of Onset  . Pancreatic cancer Father   . Other Mother        Mother died at 30 with no real medical problems     ROS:  Please see the history of present illness.  General:no colds or fevers, no weight changes Skin:no rashes or ulcers HEENT:no blurred vision, no congestion CV:see HPI PUL:see HPI GI:+ diarrhea no constipation + melena, no indigestion GU:no hematuria, no dysuria- + UTI with ureteral stents.  This admit MS:no joint pain, no claudication Neuro:no syncope, no lightheadedness Endo:no diabetes, no thyroid disease  All other ROS reviewed and negative.     Physical Exam/Data:   Vitals:   06/18/20 2100 06/19/20 0500 06/19/20 0524 06/19/20 0600  BP: 113/68  (!) 103/54   Pulse: 69  68   Resp: (!) 24 16 (!) 24 18  Temp: 98.6 F (37 C)  97.8 F (36.6 C)   TempSrc: Oral  Oral   SpO2: 99%  98%   Weight:    94.5 kg  Height:        Intake/Output Summary (Last 24 hours) at 06/19/2020 0751 Last data filed at 06/18/2020 1338 Gross per 24 hour  Intake 1400 ml  Output --  Net 1400 ml   Last 3 Weights 06/19/2020 06/18/2020 06/17/2020  Weight (lbs)  208 lb 5.4 oz 196 lb 6.4 oz 194 lb 9.6 oz  Weight (kg) 94.5 kg 89.086 kg 88.27 kg     Body mass index is 38.1 kg/m.  General:  Well  nourished, well developed, in no acute distress HEENT: normal Lymph: no adenopathy Neck: no JVD Endocrine:  No thryomegaly Vascular: No carotid bruits; pedal pulses 1+ bilaterally  Cardiac:  normal S1, S2; RRR; no murmur gallup or rub Lungs:  clear to auscultation bilaterally though breath sounds decreased in bases, no wheezing, rhonchi or rales  Abd: soft,mild tenderness, no hepatomegaly  Ext: no edema of legs, arms with some edema Musculoskeletal:  No deformities, BUE and BLE strength normal and equal Skin: warm and dry  Neuro:  Alert and oriented X 3 MAE follows commands no focal abnormalities noted Psych:  Normal affect     Relevant CV Studies: Echo 04/05/20   IMPRESSIONS    1. Left ventricular ejection fraction, by estimation, is 40%. The left  ventricle has mildly decreased function. The left ventricle demonstrates  global hypokinesis. The left ventricular internal cavity size was mildly  dilated. Left ventricular diastolic  parameters are indeterminate.  2. Right ventricular systolic function is moderately reduced. The right  ventricular size is moderately enlarged. The estimated right ventricular  systolic pressure is 40.0 mmHg.  3. Left atrial size was severely dilated.  4. Right atrial size was severely dilated.  5. The mitral valve has been repaired/replaced. Mild mitral valve  regurgitation. No evidence of mitral stenosis however Doppler alignment of  diastolic gradient is slightly suboptimal. The mean mitral valve gradient  is 2.0 mmHg with average heart rate of  70 bpm.  6. Tricuspid valve regurgitation is severe.  7. The aortic valve is tricuspid. Aortic valve regurgitation is mild. No  aortic stenosis is present.  8. The inferior vena cava is dilated in size with <50% respiratory  variability, suggesting right atrial pressure of 15 mmHg.   FINDINGS  Left Ventricle: Left ventricular ejection fraction, by estimation, is  40%. The left ventricle has  mildly decreased function. The left ventricle  demonstrates global hypokinesis. The left ventricular internal cavity size  was mildly dilated. There is no  left ventricular hypertrophy. Left ventricular diastolic parameters are  indeterminate.   Right Ventricle: The right ventricular size is moderately enlarged. No  increase in right ventricular wall thickness. Right ventricular systolic  function is moderately reduced. The tricuspid regurgitant velocity is 2.41  m/s, and with an assumed right  atrial pressure of 15 mmHg, the estimated right ventricular systolic  pressure is 86.7 mmHg.   Left Atrium: Left atrial size was severely dilated.   Right Atrium: Right atrial size was severely dilated.   Pericardium: A small pericardial effusion is present. Presence of  pericardial fat pad.   Mitral Valve: The mitral valve has been repaired/replaced. Mild mitral  valve regurgitation. There is a 28 mm Mitral ring prosthetic annuloplasty  ring present in the mitral position. Procedure Date: 2003. No evidence of  mitral valve stenosis. MV peak  gradient, 8.5 mmHg. The mean mitral valve gradient is 2.0 mmHg with  average heart rate of 70 bpm.   Tricuspid Valve: The tricuspid valve is grossly normal. Tricuspid valve  regurgitation is severe.   Aortic Valve: The aortic valve is tricuspid. . There is moderate  thickening and mild calcification of the aortic valve. Aortic valve  regurgitation is mild. No aortic stenosis is present. There is moderate  thickening of the aortic valve. There  is mild  calcification of the aortic valve.   Pulmonic Valve: The pulmonic valve was normal in structure. Pulmonic valve  regurgitation is trivial.   Aorta: The aortic root is normal in size and structure.   Venous: The inferior vena cava is dilated in size with less than 50%  respiratory variability, suggesting right atrial pressure of 15 mmHg.   IAS/Shunts: No atrial level shunt detected by color flow  Doppler.   Additional Comments: A pacer wire is visualized in the right atrium and  right ventricle.   Laboratory Data:  High Sensitivity Troponin:  No results for input(s): TROPONINIHS in the last 720 hours.   Chemistry Recent Labs  Lab 06/15/20 0547 06/16/20 0602 06/17/20 0938  NA 147* 145 143  K 4.0 3.7 3.9  CL 117* 117* 115*  CO2 20* 21* 21*  GLUCOSE 96 84 110*  BUN 28* 22 16  CREATININE 1.58* 1.37* 1.42*  CALCIUM 7.9* 7.8* 7.7*  GFRNONAA 28* 34* 32*  GFRAA 33* 39* 37*  ANIONGAP 10 7 7     Recent Labs  Lab 06/15/20 0547  PROT 5.3*  ALBUMIN 2.2*  AST 16  ALT 13  ALKPHOS 95  BILITOT 0.6   Hematology Recent Labs  Lab 06/14/20 0514 06/15/20 0547 06/17/20 0938  WBC 6.4 6.8 6.0  RBC 3.46* 3.39* 3.66*  HGB 8.7* 8.6* 9.2*  HCT 30.5* 29.8* 32.1*  MCV 88.2 87.9 87.7  MCH 25.1* 25.4* 25.1*  MCHC 28.5* 28.9* 28.7*  RDW 17.5* 17.5* 17.7*  PLT 215 209 220   BNPNo results for input(s): BNP, PROBNP in the last 168 hours.  DDimer No results for input(s): DDIMER in the last 168 hours.   Radiology/Studies:  CT ABDOMEN PELVIS WO CONTRAST  Result Date: 06/15/2020 CLINICAL DATA:  86 year old female with concern for cecal stool versus mass. EXAM: CT ABDOMEN AND PELVIS WITHOUT CONTRAST TECHNIQUE: Multidetector CT imaging of the abdomen and pelvis was performed following the standard protocol without IV contrast. COMPARISON:  CT abdomen pelvis dated 06/11/2020. FINDINGS: Evaluation of this exam is limited in the absence of intravenous contrast. Lower chest: Small bilateral pleural effusions with associated bibasilar subsegmental atelectasis. Pneumonia is not excluded. Clinical correlation is recommended. There is mild cardiomegaly. Coronary vascular calcification, cardiac pacemaker wires, and mechanical mitral valve. There is hypoattenuation of the cardiac blood pool suggestive of anemia. Clinical correlation is recommended. No intra-abdominal free air or free fluid.  Hepatobiliary: Morphologic changes of cirrhosis. No intrahepatic biliary ductal dilatation. Cholecystectomy. No retained calcified stone noted in the central CBD. Pancreas: The pancreas is unremarkable as visualized. Spleen: There is a 2.5 cm indeterminate hypodense lesion in the spleen which demonstrates fluid attenuation, likely a cyst or hemangioma. Small calcified splenic granuloma. Adrenals/Urinary Tract: The adrenal glands unremarkable. There is a left ureteral stent with proximal tip in the upper pole collecting system and distal end within the urinary bladder. There is mild left hydronephrosis. There is a 5 mm stone in the proximal left ureter along the course of the stent. A curvilinear calculus in the region of the left renal pelvis likely combination of the previously seen stone and the stone noted previously in the inferior pole of the left kidney. No stone identified within the left kidney. There is a 4 mm linear nonobstructing right renal inferior pole calculus as well as several additional punctate nonobstructing calculi. There is no hydronephrosis on the right. Subcentimeter bilateral renal lesions are not characterized on this CT. There is mild bilateral renal parenchyma atrophy. The  right ureter is unremarkable. The urinary bladder is grossly unremarkable. Air within the bladder likely related to recent instrumentation. Stomach/Bowel: There is loose stool noted throughout the colon. There is a 5.6 x 3.5 cm lobulated mass in the cecum. Further evaluation with colonoscopy recommended. There is sigmoid diverticulosis without active inflammatory changes. There is no bowel obstruction. Appendectomy. Vascular/Lymphatic: Advanced aortoiliac atherosclerotic disease. The IVC is unremarkable. No portal venous gas. 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 There is no adenopathy. Reproductive: Small calcified uterine fibroids. Other: Mild subcutaneous edema. Musculoskeletal:  Osteopenia with degenerative changes of the spine. No acute osseous pathology. IMPRESSION: 1. Lobulated mass in the cecum concerning for malignancy. Further evaluation with colonoscopy recommended. No bowel obstruction. 2. Left ureteral stent with mild left hydronephrosis. A 5 mm proximal left ureteral stone as well as a curvilinear calculus in the region of the left renal pelvis. 3. Sigmoid diverticulosis. 4. Cirrhosis. 5. 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 This is relatively similar to CTs dating back to 2014. CT with IV contrast may provide better evaluation on a nonemergent/outpatient basis, if clinically indicated. 6. Small bilateral pleural effusions with associated bibasilar subsegmental atelectasis versus infiltrate. 7. Aortic Atherosclerosis (ICD10-I70.0). Electronically Signed   By: Anner Crete M.D.   On: 06/15/2020 18:33        No chest pain     Assessment and Plan:   1. Pre-op eval pt is at high risk for high risk surgery.  She now has rt and Lt heart failure, severe TR.  Her MV repair is stable.  She is at 6.6% risk of major cardiac even with procedure.  She is pt of Dr. Radford Pax who will see her today.   Currently from cardiology perspective she is stable. .  2. Permanent atrial fib not on anticoagulation due to GI bleed.  3. Tachybrady with BiV PPM.  Stable on last interrogation in April 2021and is V pacing. 4. Hx of chronic systolic/diastolic HF and now with RHF  Along with severe TR.  Though euvolemic on exam. 5. Hx of orthostatic hypotension on midodrine.  6. Sepsis with UTI and stone, s/p placement of ureteral stent, on ABX for another 2 weeks and plan for stone and stent removal 06/26/20 7. Cecal mass, awaiting pathology but most likely will need surgery for resection.   Once path back decision for palliative care for review of strategy that pt and family agree to.        For questions or updates, please contact [place] Please consult  [email or web address] for contact info under    Signed, Cecilie Kicks, NP  06/19/2020 7:51 AM

## 2020-06-19 NOTE — Progress Notes (Signed)
Patient ID: [name], female   DOB: November 05, 1926, 86 y.o.   MRN: [id number]  Feels ok. Daughter at bedside.  Pathology of colon mass consistent with adenocarcinoma and discussed with daughter and patient. Defer to surgery next steps. Will sign off. Call if questions.

## 2020-06-19 NOTE — Progress Notes (Signed)
PROGRESS NOTE    [name]  RCV:[id number] DOB: 07/02/1926 DOA: 06/11/2020 PCP: [name], MD    No chief complaint on file.   Brief Narrative:   86 year old lady with prior history of tachybradycardia syndrome s/p biventricular pacemaker placement in 2007, severe MR s/p repair, chronic atrial fibrillation not on anticoagulation secondary to GI bleed, abnormal esophageal motility consistent with presbyesophagus, gastritis on last endoscopy with balloon dilatation by [name], pulmonary hypertension presents with 2 days of dysuria, and abdominal pain, black stools for several weeks, and increased confusion admitted for sepsis secondary to urinary tract infection in the setting of obstructive uropathy and acute on chronic anemia.  CT of the abdomen and pelvis shows left-sided moderate hydronephrosis with intrarenal calculi bilaterally, left ureteral calculi and soft tissue fullness in the cecal area.  Patient underwent ureteral stent placement by urology on 06/11/2020.  And was started on IV Rocephin and admitted to [facility] service for further evaluation and management. Urine culture showed lactobacillus transition IV Rocephin to oral amoxicillin for another 2 weeks to complete the course. [name] with Urology suggested he will post pone the stent removal to a later date. She underwent CT abd showing cecal mass concerning for malignancy. She underwent colonoscopy on 06/18/20 by [name] showing large cecal mass concerning for malignancy. Gen surgery consulted for further management. Cecal mass pathology shows adenocarcinoma. Cardiology consulted for pre op clearance as she might need surgical intervention.  Patient seen and examined at bedside, she reports some soreness in her lower abdomen and perianal area, but no nausea , vomiting. She remains on clear liquid diet at this time. Dietary consulted for recommendations and supplements.   Assessment & Plan:   Principal Problem:   Acute  lower UTI Active Problems:   Liver cirrhosis (HCC)   CKD (chronic kidney disease) stage 3, GFR 30-59 ml/min   Anemia   Biventricular cardiac pacemaker in situ   Permanent atrial fibrillation (HCC)   Chronic diastolic CHF (congestive heart failure) (HCC)   Gout   S/P MVR (mitral valve repair)   AKI (acute kidney injury) ([place])   Orthostatic hypotension dysautonomic syndrome (HCC)   GI bleeding   Obstructive uropathy   Sepsis secondary to UTI ([place])   Hydronephrosis of left kidney   Left ureteral stone   SIRS secondary to UTI in the setting of obstructive uropathy. On admission, pt had tachypnea, MAP <65 mmhg. Afebrile, AKI and Urinary source of infection.  Resolved.  Underwent ureteral stent placement and was started on IV Rocephin.  Urine culture showed lactobacillus.  IV Rocephin was transitioned to oral amoxicillin to complete the course. Blood cultures have been negative so far. Appreciate urology recommendation, follow-up with urology in about 2 weeks.  Patient is scheduled for ureteroscopic stone removal on August 10,but [name] reports he will post the procedure in view of her cecal mass.     Obstructive uropathy with left-sided moderate hydronephrosis secondary to left ureteral stone underwent left ureteral stent placement by urology on 06/11/2020.  Foley catheter to discontinued. Urine cultures growing lactobacillus.  IV Rocephin transition to oral amoxicillin to complete the course. Pt currently denies any dysuria.  CT abd shows Left ureteral stent with mild left hydronephrosis. A 5 mm proximal left ureteral stone as well as a curvilinear calculus in the region of the left renal pelvis.   Acute on stage IIIb CKD with mild metabolic acidosis Improving.  Probably secondary to obstructive uropathy in the setting of sepsis and relative  hypotension on admission. Patient creatinine peaked at 3. Currently at 1.42 and stable. . repeat renal parameters in the morning.  Avoid  nephrotoxins.    Hypocalcemia:  Corrected calcium levels are adequate.    Hypernatremia probably secondary to free water deficit from poor oral intake.  Encouraged her to take free water orally, sodium has improved from 148 to 143.  Continue to monitor. Recheck labs in the morning.    Acute anemia superimposed on anemia of chronic disease,   status post recent EGD showing gastritis and presbyesophagus.   Patient's baseline hemoglobin around 9 but admitted with a hemoglobin of 7.9. S/p 1 unit of PRBC transfusion and Hemoglobin has been around 8 to 9.  Continue with Protonix twice daily Transfuse to keep hemoglobin greater than 7.  No signs of overt bleeding evident.   Cecal adenocarcinoma:  Soft tissue fullness in the cecal area seen on the CT of the abdomen and pelvis.  Ct abdomen and pelvis showed Lobulated mass in the cecum concerning for malignancy.  She underwent colonoscopy on 06/18/20, by [name] and the biopsy results show adenocarcinoma.  General surgery consulted to see if she is a candidate for diverting colostomy. Further recommendations to follow. Meanwhile cardiology consulted for pre op clearance.  No signs of obstruction at this time.    Chronic combined systolic and diastolic heart failure She appears compensated.  Last echocardiogram on May 2021 showed left ventricular ejection fraction of 40% Continue to monitor urine output with strict intake and output and daily weights. No chest pain or sob on RA.     Chronic hypotension Continue with home midodrine. Well-controlled   ATRIAL fibrillation, S/P Biventricular Pacemaker:  On telemetry , paced.  Rate better .        DVT prophylaxis: SCDs Code Status: DNR Family Communication: discussed with daughter at bedside on 8/3 Disposition:   Status is: Inpatient  Remains inpatient appropriate because:Ongoing diagnostic testing needed not appropriate for outpatient work up   Dispo: The patient is  from: Home              Anticipated d/c is to: SNF              Anticipated d/c date is: 2 days              Patient currently is not medically stable to d/c.       Consultants:   Gastroenterology  Urology  Procedures: None  Antimicrobials: None   Subjective: Some soreness in the lower abd and perianal area, no nausea, or vomiting.,  Sitting in the bed   Objective: Vitals:   06/18/20 2100 06/19/20 0500 06/19/20 0524 06/19/20 0600  BP: 113/68  (!) 103/54   Pulse: 69  68   Resp: (!) 24 16 (!) 24 18  Temp: 98.6 F (37 C)  97.8 F (36.6 C)   TempSrc: Oral  Oral   SpO2: 99%  98%   Weight:    94.5 kg  Height:       No intake or output data in the 24 hours ending 06/19/20 1359 Filed Weights   06/17/20 0540 06/18/20 0551 06/19/20 0600  Weight: 88.3 kg 89.1 kg 94.5 kg    Examination:  General exam: Alert, appears comfortable, no distress noted, sitting in the bed. Respiratory system: Air entry fair bilateral, no wheezing or rhonchi no tachypnea Cardiovascular system: S1-S2 heard, regular, no JVD, no pedal edema Gastrointestinal system: Abdomen is soft, nontender, nondistended, bowel sounds normal Central nervous  system: Alert and oriented to person and place, grossly nonfocal Extremities: No pedal edema or cyanosis, mild upper extremity edema Skin: No rashes seen Psychiatry: Mood is appropriate  Data Reviewed: I have personally reviewed following labs and imaging studies  CBC: Recent Labs  Lab 06/14/20 0514 06/15/20 0547 06/17/20 0938  WBC 6.4 6.8 6.0  NEUTROABS 4.3  --   --   HGB 8.7* 8.6* 9.2*  HCT 30.5* 29.8* 32.1*  MCV 88.2 87.9 87.7  PLT 215 209 431    Basic Metabolic Panel: Recent Labs  Lab 06/13/20 0530 06/14/20 0514 06/15/20 0547 06/16/20 0602 06/17/20 0938  NA 143 148* 147* 145 143  K 3.9 3.8 4.0 3.7 3.9  CL 116* 118* 117* 117* 115*  CO2 19* 20* 20* 21* 21*  GLUCOSE 104* 94 96 84 110*  BUN 46* 37* 28* 22 16  CREATININE 2.35* 1.74*  1.58* 1.37* 1.42*  CALCIUM 7.7* 7.9* 7.9* 7.8* 7.7*  MG  --  2.3  --   --   --     GFR: Estimated Creatinine Clearance: 27.7 mL/min (A) (by C-G formula based on SCr of 1.42 mg/dL (H)).  Liver Function Tests: Recent Labs  Lab 06/15/20 0547  AST 16  ALT 13  ALKPHOS 95  BILITOT 0.6  PROT 5.3*  ALBUMIN 2.2*    CBG: No results for input(s): GLUCAP in the last 168 hours.   Recent Results (from the past 240 hour(s))  Urine culture     Status: Abnormal   Collection Time: 06/11/20  1:23 AM   Specimen: Urine, Catheterized  Result Value Ref Range Status   Specimen Description   Final    URINE, CATHETERIZED Performed at [facility] [address] [place], [place] [address]    Special Requests   Final    NONE Performed at [facility], [place] [address], [place], [place] [address]    Culture (A)  Final    >=100,000 COLONIES/mL LACTOBACILLUS SPECIES Standardized susceptibility testing for this organism is not available. Performed at [facility], [place] [address], [place], [place] [address]    Report Status 06/12/2020 FINAL  Final  SARS Coronavirus 2 by RT PCR (hospital order, performed in [facility] hospital lab) Nasopharyngeal Nasopharyngeal Swab     Status: None   Collection Time: 06/11/20  4:25 AM   Specimen: Nasopharyngeal Swab  Result Value Ref Range Status   SARS Coronavirus 2 NEGATIVE NEGATIVE Final    Comment: (NOTE) SARS-CoV-2 target nucleic acids are NOT DETECTED.  The SARS-CoV-2 RNA is generally detectable in upper and lower respiratory specimens during the acute phase of infection. The lowest concentration of SARS-CoV-2 viral copies this assay can detect is 250 copies / mL. A negative result does not preclude SARS-CoV-2 infection and should not be used as the sole basis for treatment or other patient management decisions.  A negative result may occur with improper specimen collection / handling, submission of specimen  other than nasopharyngeal swab, presence of viral mutation(s) within the areas targeted by this assay, and inadequate number of viral copies (<250 copies / mL). A negative result must be combined with clinical observations, patient history, and epidemiological information.  Fact Sheet for Patients:   StrictlyIdeas.no  Fact Sheet for Healthcare Providers: BankingDealers.co.za  This test is not yet approved or  cleared by the [place] FDA and has been authorized for detection and/or diagnosis of SARS-CoV-2 by FDA under an Emergency Use Authorization (EUA).  This EUA  will remain in effect (meaning this test can be used) for the duration of the COVID-19 declaration under Section 564(b)(1) of the Act, 21 U.S.C. section 360bbb-3(b)(1), unless the authorization is terminated or revoked sooner.  Performed at [facility], [place] [address] [place], [place] [address]   Urine Culture     Status: Abnormal   Collection Time: 06/11/20  7:22 PM   Specimen: Urine, Catheterized  Result Value Ref Range Status   Specimen Description   Final    URINE, CATHETERIZED Performed at [facility] [address], [place], [place] [address]    Special Requests   Final    NONE Performed at [facility], [place] [address] [place], [place] [address]    Culture (A)  Final    70,000 COLONIES/mL LACTOBACILLUS SPECIES Standardized susceptibility testing for this organism is not available. Performed at [facility], [place] [address], [place], [place] [address]    Report Status 06/13/2020 FINAL  Final         Radiology Studies: No results found.      Scheduled Meds: . feeding supplement  1 Container Oral TID BM  . midodrine  5 mg Oral TID WC  . pantoprazole  40 mg Intravenous Q12H  . senna-docusate  2 tablet Oral BID  . sodium chloride flush  3 mL Intravenous Q12H   Continuous  Infusions: . lactated ringers       LOS: 8 days        Hosie Poisson, MD [facility]   To contact the attending provider between 7A-7P or the covering provider during after hours 7P-7A, please log into the web site [email or web address] and access using universal [place] password for that web site. If you do not have the password, please call the hospital operator.  06/19/2020, 1:59 PM

## 2020-06-20 ENCOUNTER — Inpatient Hospital Stay (HOSPITAL_COMMUNITY): Payer: Medicare Other

## 2020-06-20 DIAGNOSIS — N189 Chronic kidney disease, unspecified: Secondary | ICD-10-CM

## 2020-06-20 DIAGNOSIS — Z515 Encounter for palliative care: Secondary | ICD-10-CM

## 2020-06-20 DIAGNOSIS — Z7189 Other specified counseling: Secondary | ICD-10-CM

## 2020-06-20 DIAGNOSIS — Z66 Do not resuscitate: Secondary | ICD-10-CM

## 2020-06-20 DIAGNOSIS — I4821 Permanent atrial fibrillation: Secondary | ICD-10-CM

## 2020-06-20 DIAGNOSIS — N1832 Chronic kidney disease, stage 3b: Secondary | ICD-10-CM

## 2020-06-20 LAB — COMPREHENSIVE METABOLIC PANEL
ALT: 12 U/L (ref 0–44)
AST: 16 U/L (ref 15–41)
Albumin: 2.2 g/dL — ABNORMAL LOW (ref 3.5–5.0)
Alkaline Phosphatase: 87 U/L (ref 38–126)
Anion gap: 6 (ref 5–15)
BUN: 13 mg/dL (ref 8–23)
CO2: 21 mmol/L — ABNORMAL LOW (ref 22–32)
Calcium: 7.7 mg/dL — ABNORMAL LOW (ref 8.9–10.3)
Chloride: 114 mmol/L — ABNORMAL HIGH (ref 98–111)
Creatinine, Ser: 1.27 mg/dL — ABNORMAL HIGH (ref 0.44–1.00)
GFR calc Af Amer: 43 mL/min — ABNORMAL LOW (ref >60)
GFR calc non Af Amer: 37 mL/min — ABNORMAL LOW (ref >60)
Glucose, Bld: 86 mg/dL (ref 70–99)
Potassium: 3.9 mmol/L (ref 3.5–5.1)
Sodium: 141 mmol/L (ref 135–145)
Total Bilirubin: 0.7 mg/dL (ref 0.3–1.2)
Total Protein: 4.9 g/dL — ABNORMAL LOW (ref 6.5–8.1)

## 2020-06-20 LAB — CBC
HCT: 30.8 % — ABNORMAL LOW (ref 36.0–46.0)
Hemoglobin: 8.7 g/dL — ABNORMAL LOW (ref 12.0–15.0)
MCH: 25.1 pg — ABNORMAL LOW (ref 26.0–34.0)
MCHC: 28.2 g/dL — ABNORMAL LOW (ref 30.0–36.0)
MCV: 88.8 fL (ref 80.0–100.0)
Platelets: 185 10*3/uL (ref 150–400)
RBC: 3.47 MIL/uL — ABNORMAL LOW (ref 3.87–5.11)
RDW: 18.1 % — ABNORMAL HIGH (ref 11.5–15.5)
WBC: 5.3 10*3/uL (ref 4.0–10.5)
nRBC: 0 % (ref 0.0–0.2)

## 2020-06-20 LAB — CEA: CEA: 575 ng/mL — ABNORMAL HIGH (ref 0.0–4.7)

## 2020-06-20 LAB — SURGICAL PATHOLOGY

## 2020-06-20 LAB — PROTIME-INR
INR: 1.2 (ref 0.8–1.2)
Prothrombin Time: 14.6 seconds (ref 11.4–15.2)

## 2020-06-20 MED ORDER — PROSOURCE PLUS PO LIQD
30.0000 mL | Freq: Two times a day (BID) | ORAL | Status: DC
  Administered 2020-06-23 (×2): 30 mL via ORAL
  Filled 2020-06-20 (×8): qty 30

## 2020-06-20 MED ORDER — SODIUM CHLORIDE 0.9 % IV SOLN
2.0000 g | INTRAVENOUS | Status: AC
  Filled 2020-06-20: qty 2

## 2020-06-20 MED ORDER — ACETAMINOPHEN 500 MG PO TABS: 1000.0000 mg | ORAL_TABLET | ORAL | Status: AC

## 2020-06-20 MED ORDER — CHLORHEXIDINE GLUCONATE 4 % EX LIQD
60.0000 mL | Freq: Once | CUTANEOUS | Status: AC
  Administered 2020-06-21: 4 via TOPICAL
  Filled 2020-06-20: qty 60

## 2020-06-20 MED ORDER — CHLORHEXIDINE GLUCONATE 4 % EX LIQD
60.0000 mL | Freq: Once | CUTANEOUS | Status: AC
  Administered 2020-06-20: 4 via TOPICAL
  Filled 2020-06-20: qty 60

## 2020-06-20 MED ORDER — GABAPENTIN 300 MG PO CAPS: 300.0000 mg | ORAL_CAPSULE | ORAL | Status: AC

## 2020-06-20 MED ORDER — ALVIMOPAN 12 MG PO CAPS: 12.0000 mg | ORAL_CAPSULE | ORAL | Status: AC

## 2020-06-20 MED ORDER — AMOXICILLIN 250 MG PO CAPS
500.0000 mg | ORAL_CAPSULE | Freq: Three times a day (TID) | ORAL | Status: DC
  Administered 2020-06-20 – 2020-06-24 (×10): 500 mg via ORAL
  Filled 2020-06-20 (×10): qty 2

## 2020-06-20 NOTE — Progress Notes (Signed)
Physical Therapy Treatment Patient Details Name: [name] MRN: [id number] DOB: 17-Aug-1926 Today's Date: 06/20/2020    History of Present Illness 86 yo female admitted with UTI SIRS. S/P cystoscopy, L double J stent placement 06/11/20. Hx of Afib, CKD, anemia, CHF, NSVT, pacemaker, chronic back pain, spinal stenosis, orthostatic hypotension    PT Comments    Pt participated well. Per chart, plan is for surgical procedure on tomorrow. Will continue to follow during hospital stay.    Follow Up Recommendations  SNF     Equipment Recommendations  None recommended by PT    Recommendations for Other Services       Precautions / Restrictions Precautions Precautions: Fall Restrictions Weight Bearing Restrictions: No    Mobility  Bed Mobility Overal bed mobility: Needs Assistance Bed Mobility: Supine to Sit;Sit to Supine     Supine to sit: Supervision;HOB elevated Sit to supine: Supervision;HOB elevated   General bed mobility comments: Increased time.  Transfers Overall transfer level: Needs assistance Equipment used: Rolling walker (2 wheeled) Transfers: Sit to/from [facility] Sit to Stand: Min guard Stand pivot transfers: Min guard       General transfer comment: VCS safety, hand placement.  Ambulation/Gait Ambulation/Gait assistance: Min guard Gait Distance (Feet): 70 Feet Assistive device: Rolling walker (2 wheeled) Gait Pattern/deviations: Step-through pattern;Decreased stride length     General Gait Details: Min guard for safety. Distance limited by back pain and dyspnea   Stairs             Wheelchair Mobility    Modified Rankin (Stroke Patients Only)       Balance Overall balance assessment: Needs assistance         Standing balance support: Bilateral upper extremity supported Standing balance-Leahy Scale: Poor                              Cognition Arousal/Alertness: Awake/alert Behavior During  Therapy: WFL for tasks assessed/performed Overall Cognitive Status: Within Functional Limits for tasks assessed                                        Exercises      General Comments        Pertinent Vitals/Pain Pain Assessment: No/denies pain    Home Living                      Prior Function            PT Goals (current goals can now be found in the care plan section) Progress towards PT goals: Progressing toward goals    Frequency    Min 3X/week      PT Plan Current plan remains appropriate    Co-evaluation              AM-PAC PT "6 Clicks" Mobility   Outcome Measure  Help needed turning from your back to your side while in a flat bed without using bedrails?: A Little Help needed moving from lying on your back to sitting on the side of a flat bed without using bedrails?: A Little Help needed moving to and from a bed to a chair (including a wheelchair)?: A Little Help needed standing up from a chair using your arms (e.g., wheelchair or bedside chair)?: A Little Help needed to walk in hospital room?: A  Little Help needed climbing 3-5 steps with a railing? : A Little 6 Click Score: 18    End of Session Equipment Utilized During Treatment: Gait belt Activity Tolerance: Patient limited by fatigue;Patient limited by pain Patient left: in bed;with call bell/phone within reach;with bed alarm set   PT Visit Diagnosis: Difficulty in walking, not elsewhere classified (R26.2);Muscle weakness (generalized) (M62.81)     Time: 5284-1324 PT Time Calculation (min) (ACUTE ONLY): 19 min  Charges:  $Gait Training: 8-22 mins                         Doreatha Massed, PT Acute Rehabilitation  Office: [phone] Pager: [phone]

## 2020-06-20 NOTE — Progress Notes (Addendum)
PROGRESS NOTE    [name]  NUU:[id number] DOB: 24-Sep-1926 DOA: 06/11/2020 PCP: [name], MD   No chief complaint on file.   Brief Narrative:  86 year old lady with prior history of tachybradycardia syndrome s/p biventricular pacemaker placement in 2007, severe MR s/p repair, chronic atrial fibrillation not on anticoagulation secondary to GI bleed, abnormal esophageal motility consistent with presbyesophagus, gastritis on last endoscopy with balloon dilatation by [name], pulmonary hypertension presents with 2 days of dysuria, and abdominal pain, black stools for several weeks, and increased confusion admitted for sepsis secondary to urinary tract infection in the setting of obstructive uropathy and acute on chronic anemia.  CT of the abdomen and pelvis shows left-sided moderate hydronephrosis with intrarenal calculi bilaterally, left ureteral calculi and soft tissue fullness in the cecal area.  Patient underwent ureteral stent placement by urology on 06/11/2020.  And was started on IV Rocephin and admitted to [facility] service for further evaluation and management. Urine culture showed lactobacillus transition IV Rocephin to oral amoxicillin for another 2 weeks to complete the course. [name] with Urology suggested he will post pone the stent removal to a later date. She underwent CT abd showing cecal mass concerning for malignancy. She underwent colonoscopy on 06/18/20 by [name] showing large cecal mass concerning for malignancy. Gen surgery consulted for further management. Cecal mass pathology shows adenocarcinoma. Cardiology consulted for pre op clearance as she might need surgical intervention.  Oncology consulted who recommended CT chest for further staging.  Patient for surgery tomorrow 06/21/2020.   Assessment & Plan:   Principal Problem:   Acute lower UTI Active Problems:   Liver cirrhosis (HCC)   CKD (chronic kidney disease) stage 3, GFR 30-59 ml/min   Anemia    Biventricular cardiac pacemaker in situ   Permanent atrial fibrillation (HCC)   Chronic diastolic CHF (congestive heart failure) (HCC)   Gout   S/P MVR (mitral valve repair)   AKI (acute kidney injury) ([place])   Orthostatic hypotension dysautonomic syndrome (HCC)   GI bleeding   Obstructive uropathy   Sepsis secondary to UTI ([place])   Hydronephrosis of left kidney   Left ureteral stone   Advanced care planning/counseling discussion   Goals of care, counseling/discussion   Palliative care by specialist   DNR (do not resuscitate)   Acute on chronic renal insufficiency  #1 SIRS/Sepsis secondary to UTI Patient on admission met criteria for systemic inflammatory response with, MAP < 65, AKI, urinalysis worrisome for UTI.  Urine cultures positive for lactobacillus.  Patient noted to have left-sided moderate hydronephrosis secondary to left ureteral stone and underwent left ureteral stent placement 06/11/2020.  Patient was on IV Rocephin which has subsequently been discontinued.  Will place on amoxicillin for 10 to 14 days to complete a course of antibiotic treatment as patient with stent placement and left ureteral stone.  Patient currently afebrile.  Follow.  2.  Obstructive uropathy with left-sided moderate hydronephrosis secondary to left ureteral stone Noted on CT abdomen and pelvis.  Patient seen in consultation by urology and underwent left ureteral stent placement 06/11/2020.  Foley catheter discontinued.  Urine cultures positive for lactobacillus.  Patient on IV Rocephin which is subsequently been discontinued.  Will place on oral amoxicillin to complete a 10 to 14-day course of antibiotic treatment.  Patient with good urine output.  Clinical improvement.  Per urology reschedule uteroscopy for about 2 to 3 weeks out to give patient opportunity to recover from pending colon resection.  Urology following and  I appreciate the input and recommendations.  3.  Cecal adenocarcinoma Soft tissue fullness  noted in cecal area on CT abdomen and pelvis.  Repeat CT abdomen and pelvis showing a lobulated mass in the cecum concerning for malignancy.  GI consulted and patient underwent colonoscopy with biopsy on 06/18/2020 per Dr. Michail Sermon with pathology positive for adenocarcinoma.  Patient seen in consultation by general surgery to see whether she is a candidate for diverting colostomy.  Patient seen in consultation by cardiology for preop clearance.  Patient currently not obstructed at this time.  CT chest for staging.  Patient for surgery tomorrow 06/21/2020.  Consult with oncology for further evaluation and management.  4.  Chronic combined systolic diastolic heart failure Patient appears compensated.  2D echo from May 2021 with a EF of 40%.  Patient denies any significant shortness of breath.  Resume home regimen Demadex postoperatively.  5.  Chronic hypotension Continue home regimen midodrine.  6.  Permanent atrial fibrillation Currently rate controlled.  Patient deemed not a anticoagulation candidate secondary to history of GI bleed.  Cardiology following.  7.  Tachybradycardia syndrome with BiV PPM Stable.  Last interrogation April 2021 patient noted to be V pacing.  Per cardiology.  8.  Acute on chronic kidney disease stage IIIb Likely post renal azotemia in the setting of obstructive uropathy in the setting of sepsis and relative hypotension on admission.  Creatinine noted to have peaked at 3 and trending down.  Diuretics on hold.  Patient with good urine output.  Likely resume diuretics postoperatively.  Follow.  9.  Hypernatremia Likely secondary to free water deficit secondary to poor oral intake.  Diuretics on hold.  Improved.  Follow.  10.  Anemia of chronic disease Status post recent EGD showing gastritis and presbyesophagus.  Baseline hemoglobin approximately 9.  On admission hemoglobin noted to be 7.9.  Status post 1 unit transfusion packed red blood cells hemoglobin currently at 8.7 and  currently stable.  Monitor H&H postop.  Transfusion threshold hemoglobin < 7.     DVT prophylaxis: SCDs.  History of GI bleed.  Patient for surgery tomorrow. Code Status: DNR Family Communication: Updated patient and daughter at bedside. Disposition:   Status is: Inpatient    Dispo: The patient is from: Assisted living facility              Anticipated d/c is to: Assisted living facility              Anticipated d/c date is: To be determined.              Patient currently being worked up for cecal cancer and for surgery tomorrow 06/21/2020.  Oncology consultation pending.  Not stable for discharge.       Consultants:   General surgery: Dr.Toth III 06/19/2020  Cardiology: Dr. Radford Pax 06/19/2020  Gastroenterology: Dr. Watt Climes 06/11/2020  Urology: Dr. Jeffie Pollock 06/11/2020  Oncology pending Dr. Lorenso Courier 06/20/2020  Palliative care pending  Procedures:  CT chest 06/28/2020  CT abdomen and pelvis 06/15/2020, 06/11/2020  Chest x-ray 06/11/2020  Colonoscopy 06/18/2020--per Dr. Michail Sermon gastroenterology  Cystoscopy with left retrograde pyelogram and interpretation/cystoscopy with insertion of left double-J stent per Dr.Wrenn 06/11/2020  Transfusion 1 unit packed red blood cells 06/11/2020  Antimicrobials:  IV Rocephin 06/11/2020>>>> 06/14/2020  Amoxicillin 06/20/2020   Subjective: Sitting up in bed.  Daughter at bedside.  Denies any chest pain.  No shortness of breath.  No abdominal pain.  Positive flatus.  Positive bowel movements.  Objective: Vitals:  06/19/20 2116 06/20/20 0500 06/20/20 0516 06/20/20 1243  BP: (!) 129/50  (!) 100/42 116/65  Pulse: 69  71 69  Resp: (!) 24  20   Temp: 97.7 F (36.5 C)  97.6 F (36.4 C) 98 F (36.7 C)  TempSrc: Oral  Oral Oral  SpO2: 99%  99% 99%  Weight:  90 kg    Height:        Intake/Output Summary (Last 24 hours) at 06/20/2020 2003 Last data filed at 06/20/2020 0939 Gross per 24 hour  Intake 120 ml  Output --  Net 120 ml   Filed Weights    06/18/20 0551 06/19/20 0600 06/20/20 0500  Weight: 89.1 kg 94.5 kg 90 kg    Examination:  General exam: NAD Respiratory system: Some bibasilar crackles.no wheezing.  Normal respiratory effort.  No use of accessory muscles of respiration.  Cardiovascular system: S1 & S2 heard, RRR. No JVD, murmurs, rubs, gallops or clicks. No pedal edema. Gastrointestinal system: Abdomen is nondistended, soft and nontender. No organomegaly or masses felt. Normal bowel sounds heard. Central nervous system: Alert and oriented. No focal neurological deficits. Extremities: Symmetric 5 x 5 power. Skin: No rashes, lesions or ulcers Psychiatry: Judgement and insight appear normal. Mood & affect appropriate.     Data Reviewed: I have personally reviewed following labs and imaging studies  CBC: Recent Labs  Lab 06/14/20 0514 06/15/20 0547 06/17/20 0938 06/20/20 0604  WBC 6.4 6.8 6.0 5.3  NEUTROABS 4.3  --   --   --   HGB 8.7* 8.6* 9.2* 8.7*  HCT 30.5* 29.8* 32.1* 30.8*  MCV 88.2 87.9 87.7 88.8  PLT 215 209 220 568    Basic Metabolic Panel: Recent Labs  Lab 06/14/20 0514 06/15/20 0547 06/16/20 0602 06/17/20 0938 06/20/20 0604  NA 148* 147* 145 143 141  K 3.8 4.0 3.7 3.9 3.9  CL 118* 117* 117* 115* 114*  CO2 20* 20* 21* 21* 21*  GLUCOSE 94 96 84 110* 86  BUN 37* 28* '22 16 13  ' CREATININE 1.74* 1.58* 1.37* 1.42* 1.27*  CALCIUM 7.9* 7.9* 7.8* 7.7* 7.7*  MG 2.3  --   --   --   --     GFR: Estimated Creatinine Clearance: 30.1 mL/min (A) (by C-G formula based on SCr of 1.27 mg/dL (H)).  Liver Function Tests: Recent Labs  Lab 06/15/20 0547 06/20/20 0604  AST 16 16  ALT 13 12  ALKPHOS 95 87  BILITOT 0.6 0.7  PROT 5.3* 4.9*  ALBUMIN 2.2* 2.2*    CBG: No results for input(s): GLUCAP in the last 168 hours.   Recent Results (from the past 240 hour(s))  Urine culture     Status: Abnormal   Collection Time: 06/11/20  1:23 AM   Specimen: Urine, Catheterized  Result Value Ref Range  Status   Specimen Description   Final    URINE, CATHETERIZED Performed at [place] [address] [place], [place] [address]    Special Requests   Final    NONE Performed at [facility], [place] [address], [place], [place] [address]    Culture (A)  Final    >=100,000 COLONIES/mL LACTOBACILLUS SPECIES Standardized susceptibility testing for this organism is not available. Performed at [facility], [place] [address], [place],  [address]    Report Status 06/12/2020 FINAL  Final  SARS Coronavirus 2 by RT PCR (hospital order, performed in [facility] hospital lab) Nasopharyngeal Nasopharyngeal Swab  Status: None   Collection Time: 06/11/20  4:25 AM   Specimen: Nasopharyngeal Swab  Result Value Ref Range Status   SARS Coronavirus 2 NEGATIVE NEGATIVE Final    Comment: (NOTE) SARS-CoV-2 target nucleic acids are NOT DETECTED.  The SARS-CoV-2 RNA is generally detectable in upper and lower respiratory specimens during the acute phase of infection. The lowest concentration of SARS-CoV-2 viral copies this assay can detect is 250 copies / mL. A negative result does not preclude SARS-CoV-2 infection and should not be used as the sole basis for treatment or other patient management decisions.  A negative result may occur with improper specimen collection / handling, submission of specimen other than nasopharyngeal swab, presence of viral mutation(s) within the areas targeted by this assay, and inadequate number of viral copies (<250 copies / mL). A negative result must be combined with clinical observations, patient history, and epidemiological information.  Fact Sheet for Patients:   StrictlyIdeas.no  Fact Sheet for Healthcare Providers: BankingDealers.co.za  This test is not yet approved or  cleared by the [place] FDA and has been authorized for detection and/or diagnosis of  SARS-CoV-2 by FDA under an Emergency Use Authorization (EUA).  This EUA will remain in effect (meaning this test can be used) for the duration of the COVID-19 declaration under Section 564(b)(1) of the Act, 21 U.S.C. section 360bbb-3(b)(1), unless the authorization is terminated or revoked sooner.  Performed at [facility], [place] [address], [place], [place] [address]   Urine Culture     Status: Abnormal   Collection Time: 06/11/20  7:22 PM   Specimen: Urine, Catheterized  Result Value Ref Range Status   Specimen Description   Final    URINE, CATHETERIZED Performed at [place] [address], [place], [place] [address]    Special Requests   Final    NONE Performed at [facility], [place] [address] [place], [place] [address]    Culture (A)  Final    70,000 COLONIES/mL LACTOBACILLUS SPECIES Standardized susceptibility testing for this organism is not available. Performed at [facility], [place] [address] [place], [place] [address]    Report Status 06/13/2020 FINAL  Final         Radiology Studies: CT CHEST WO CONTRAST  Result Date: 06/20/2020 CLINICAL DATA:  Colorectal cancer. EXAM: CT CHEST WITHOUT CONTRAST TECHNIQUE: Multidetector CT imaging of the chest was performed following the standard protocol without IV contrast. COMPARISON:  June 15, 2020. FINDINGS: Cardiovascular: Atherosclerosis of thoracic aorta is noted without aneurysm formation. Mild cardiomegaly is noted. No pericardial effusion is noted. Left-sided pacemaker is noted. Status post aortic mitral valve repair. Mediastinum/Nodes: No enlarged mediastinal or axillary lymph nodes. Thyroid gland, trachea, and esophagus demonstrate no significant findings. Lungs/Pleura: No pneumothorax is noted. Small bilateral pleural effusions are noted with adjacent subsegmental atelectasis. Interval development of 10 mm subpleural rounded density seen posteriorly in the  right lung apex best seen on image number 28 of series 5. This may simply represent scarring or focal atelectasis, but neoplasm cannot be excluded. Upper Abdomen: Nodular hepatic contours are noted suggesting possible hepatic cirrhosis. Musculoskeletal: No chest wall mass or suspicious bone lesions identified. IMPRESSION: 1. Interval development of 10 mm subpleural rounded density seen posteriorly in the right lung apex. This may simply represent scarring or focal atelectasis, but neoplasm or metastatic lesion cannot be excluded. Follow-up unenhanced chest CT in 3 months is recommended to ensure stability or resolution. 2. Small bilateral pleural  effusions are noted with adjacent subsegmental atelectasis. 3. Nodular hepatic contours are noted suggesting possible hepatic cirrhosis. Aortic Atherosclerosis (ICD10-I70.0). Electronically Signed   By: Marijo Conception M.D.   On: 06/20/2020 13:29        Scheduled Meds: . (feeding supplement) PROSource Plus  30 mL Oral BID BM  . [START ON 06/21/2020] acetaminophen  1,000 mg Oral On Call to OR  . [START ON 06/21/2020] alvimopan  12 mg Oral On Call to OR  . amoxicillin  500 mg Oral Q8H  . chlorhexidine  60 mL Topical Once   And  . [START ON 06/21/2020] chlorhexidine  60 mL Topical Once  . feeding supplement  1 Container Oral TID BM  . [START ON 06/21/2020] gabapentin  300 mg Oral On Call to OR  . midodrine  5 mg Oral TID WC  . pantoprazole  40 mg Oral BID  . senna-docusate  2 tablet Oral BID  . sodium chloride flush  3 mL Intravenous Q12H   Continuous Infusions: . [START ON 06/21/2020] cefoTEtan (CEFOTAN) IV    . lactated ringers       LOS: 9 days    Time spent: 40 minutes    Irine Seal, MD [facility]   To contact the attending provider between 7A-7P or the covering provider during after hours 7P-7A, please log into the web site [email or web address] and access using universal [employer] password for that web site. If you do not have the  password, please call the hospital operator.  06/20/2020, 8:03 PM

## 2020-06-20 NOTE — Progress Notes (Signed)
Subjective: Christionna continues to do well s/p stenting.  She has minimal left abdominal pain and is voiding without complaints or hematuria.  Her Cr has declined further.   She is scheduled for colon resection in the near future and the ureteroscopy that was scheduled for 8/10 will be postponed.   ROS:  Review of Systems  Constitutional: Negative for fever.  Genitourinary: Negative for dysuria and hematuria.    Anti-infectives: Anti-infectives (From admission, onward)   Start     Dose/Rate Route Frequency Ordered Stop   06/14/20 1000  amoxicillin (AMOXIL) capsule 500 mg        500 mg Oral Every 12 hours 06/14/20 0741 06/19/20 0959   06/11/20 1030  cefTRIAXone (ROCEPHIN) 1 g in sodium chloride 0.9 % 100 mL IVPB  Status:  Discontinued        1 g 200 mL/hr over 30 Minutes Intravenous Every 24 hours 06/11/20 1021 06/14/20 0741   06/11/20 0300  cefTRIAXone (ROCEPHIN) 1 g in sodium chloride 0.9 % 100 mL IVPB        1 g 200 mL/hr over 30 Minutes Intravenous  Once 06/11/20 0259 06/11/20 0458      Current Facility-Administered Medications  Medication Dose Route Frequency Provider Last Rate Last Admin  . acetaminophen (TYLENOL) tablet 650 mg  650 mg Oral Q6H PRN Irine Seal, MD   650 mg at 06/13/20 1640   Or  . acetaminophen (TYLENOL) suppository 650 mg  650 mg Rectal Q6H PRN Irine Seal, MD      . acetaminophen (TYLENOL) tablet 1,000 mg  1,000 mg Oral Q6H PRN Irine Seal, MD      . feeding supplement (BOOST / RESOURCE BREEZE) liquid 1 Container  1 Container Oral TID BM Meuth, Brooke A, PA-C   1 Container at 06/19/20 [id number]  . HYDROmorphone (DILAUDID) injection 0.5 mg  0.5 mg Intravenous Q3H PRN Irine Seal, MD   0.5 mg at 06/12/20 2008  . lactated ringers infusion   Intravenous Continuous Wilford Corner, MD      . metoprolol tartrate (LOPRESSOR) injection 5 mg  5 mg Intravenous Q6H PRN Irine Seal, MD      . midodrine (PROAMATINE) tablet 5 mg  5 mg Oral TID WC Irine Seal, MD   5 mg at  06/19/20 1726  . ondansetron (ZOFRAN) injection 4 mg  4 mg Intravenous Q6H PRN Lang Snow, FNP   4 mg at 06/14/20 2144  . oxyCODONE (Oxy IR/ROXICODONE) immediate release tablet 2.5 mg  2.5 mg Oral Q4H PRN Baron-Johnson, Alison, PA-C      . pantoprazole (PROTONIX) EC tablet 40 mg  40 mg Oral BID Hosie Poisson, MD   40 mg at 06/19/20 2249  . promethazine (PHENERGAN) injection 12.5 mg  12.5 mg Intravenous Q6H PRN Hosie Poisson, MD   12.5 mg at 06/14/20 1340  . senna-docusate (Senokot-S) tablet 2 tablet  2 tablet Oral BID Oretha Milch D, MD   2 tablet at 06/17/20 2241  . sodium chloride flush (NS) 0.9 % injection 3 mL  3 mL Intravenous Q12H Irine Seal, MD   3 mL at 06/19/20 0939     Objective: Vital signs in last 24 hours: Temp:  [97.6 F (36.4 C)-98.4 F (36.9 C)] 97.6 F (36.4 C) (08/04 0516) Pulse Rate:  [69-71] 71 (08/04 0516) Resp:  [20-24] 20 (08/04 0516) BP: (100-130)/(42-80) 100/42 (08/04 0516) SpO2:  [99 %] 99 % (08/04 0516) Weight:  [90 kg] 90 kg (08/04 0500)  Intake/Output from previous day:  08/03 0701 - 08/04 0700 In: 360 [P.O.:360] Out: -  Intake/Output this shift: No intake/output data recorded.   Physical Exam Vitals reviewed.  Constitutional:      Appearance: Normal appearance.  Neurological:     Mental Status: She is alert.     Lab Results:  Recent Labs    06/17/20 0938 06/20/20 0604  WBC 6.0 5.3  HGB 9.2* 8.7*  HCT 32.1* 30.8*  PLT 220 185   BMET Recent Labs    06/17/20 0938 06/20/20 0604  NA 143 141  K 3.9 3.9  CL 115* 114*  CO2 21* 21*  GLUCOSE 110* 86  BUN 16 13  CREATININE 1.42* 1.27*  CALCIUM 7.7* 7.7*   PT/INR Recent Labs    06/19/20 0537 06/20/20 0604  LABPROT 14.6 14.6  INR 1.2 1.2   ABG No results for input(s): PHART, HCO3 in the last 72 hours.  Invalid input(s): PCO2, PO2  Studies/Results: No results found.   Assessment and Plan: Left ureteral stones with obstruction and sepsis.  She is doing well from a  GU standpoint.   I will rescheduled ureteroscopy for about 2-3 weeks out to give her an opportunity to recover from the pending colon resection.         LOS: 9 days    Irine Seal 06/20/2020 [phone] ID: Bennetta Laos, female   DOB: 06-19-1926, 86 y.o.   MRN: [id number] Patient ID: VALINCIA TOUCH, female   DOB: [birth year], 86 y.o.   MRN: [id number] Patient ID: RUPINDER LIVINGSTON, female   DOB: January 12, 1926, 86 y.o.   MRN: [id number]

## 2020-06-20 NOTE — Progress Notes (Signed)
Initial Nutrition Assessment  DOCUMENTATION CODES:   Obesity unspecified  INTERVENTION:  Continue Boost Breeze po TID, each supplement provides 250 kcal and 9 grams of protein  ProSource Plus 30 ml po BID, each supplement provides 100 kcal and 15 grams of protein  Advance diet as medically feasible  Pt is at high refeeding risk, will need to monitor magnesium, potassium, and phosphorus daily as po intake improves s/p surgery   NUTRITION DIAGNOSIS:   Inadequate oral intake related to altered GI function (newly diagnosed colon adenocarcimona pending right colectomy) as evidenced by  (NPO/CL > 5 days).    GOAL:   Patient will meet greater than or equal to 90% of their needs    MONITOR:   PO intake, Supplement acceptance, Diet advancement, Weight trends, Labs, I & O's  REASON FOR ASSESSMENT:   Consult Assessment of nutrition requirement/status  ASSESSMENT:  RD working remotely.  86 year old female with past medical history of tachybradycardia syndrome s/p BiV pacemaker in 2007, severe MR s/p repair, chronic atrial fibrillation, history of GI bleed, abnormal esophageal motility consistent with presbyesophagus, gastritis with recent balloon dilation on July 20, HTN, and gout presented with 2 day history of urinary tract symptoms, right upper abdominal pain, and several week history of black stools admitted for sepsis secondary to UTI in the setting of obstructive uropathy and acute on chronic anemia.  CT abdomen pelvis showed moderate hydronephrosis as well as cecal mass concerning for malignancy. Patient underwent left ureteral stent placement by urology on 06/11/20 and colonoscopy on 06/18/20 with biopsy. Pathology returned, consistent with adenocarcinoma, surgery consulted. Plans for laparoscopic assisted right colectomy tomorrow.   Diet history reviewed, pt NPO/CL on 7/26, diet advanced to regular at 1008 on 7/27 then 2 gram Na/1500 ml at 2146. Pt on CLD from 7/28-8/01, then  NPO/CL on 8/02. She is currently on CLD, and will be NPO at midnight for pending surgery. Patient has been NPO/CL for the last 6 days and is at risk for malnutrition. Per meds, she is receiving Boost Breeze TID, will also order ProSource Plus to aid with meeting needs. RD will monitor for diet advancement s/p surgery and provide oral nutrition supplements as appropriate. Patient likely to refeed as diet advances, will need to monitor magnesium, potassium, and phosphorus daily as po intake improves.   Mild pitting BLE edema noted per RN assessment  Admit wt 89.8 kg        Current wt 90 kg Per chart, appears pt experienced some wt loss earlier in the year, however they have trended back up to usual body weight. On 10/18/19 pt weighed 90.7 kg (199.54 lb), on 04/06/20 she weighed 87.4 kg (192.28 lb), on 04/11/20 weights decreased to 83.5 kg (183.7 lb), then on 06/05/20 weights trended up to 86.2 kg (189.64 lb)  Medications reviewed and include: Protonix, Senokot Labs: Cr 1.27 (H), Hgb 8.7 (L), K 3.9 (WNL)   NUTRITION - FOCUSED PHYSICAL EXAM: Unable to complete at this time, RD working remotely.  Diet Order:   Diet Order            Diet NPO time specified  Diet effective midnight           Diet clear liquid Room service appropriate? Yes; Fluid consistency: Thin  Diet effective now                 EDUCATION NEEDS:   No education needs have been identified at this time  Skin:  Skin Assessment: Skin  Integrity Issues: Skin Integrity Issues:: Other (Comment) Other: MASD; buttocks  Last BM:  8/2 - type 7  Height:   Ht Readings from Last 1 Encounters:  06/11/20 5\' 2"  (1.575 m)    Weight:   Wt Readings from Last 1 Encounters:  06/20/20 90 kg    Ideal Body Weight:  50 kg  BMI:  Body mass index is 36.29 kg/m.  Estimated Nutritional Needs:   Kcal:  3507-5732  Protein:  98-108  Fluid:  >/= 1.7 L   Lajuan Lines, RD, LDN Clinical Nutrition After Hours/Weekend Pager # in  [place]

## 2020-06-20 NOTE — Consult Note (Signed)
Consultation Note Date: 06/20/2020   Patient Name: [name]  DOB: 09-Aug-1926  MRN: [id number]  Age / Sex: 86 y.o., female  PCP: [name], MD Referring Physician: Eugenie Filler, MD  Reason for Consultation: Establishing goals of care  HPI/Patient Profile: 86 y.o. female  with past medical history of CKD3, hypertension, atrial fibrillation, chronic diastolic heart failure, tachybrady syndrome s/p B/v pacemaker, severe MR s/p MVR 2003, presbyesophagus, and gout  admitted on 06/11/2020 with 2d history of urinary symptoms with right abdominal pain and found to have obstructive uropathy 2/2 left renal calculi. Urology was consulted and she underwent ureteral stent placement on 7/26. She was additionally started on empiric antibiotics for UTI--urine cultures + lactobacillus.  She had previously been admitted to [facility] in May 2021 for syncope and found to have acute on chronic anemia requiring blood transfusion. Pt's PCP records are not available for viewing but appears her hgb was 11 in 2018 and had dropped to 7.9 during the May 2021 admission. An iron panel was consistent with iron deficiency anemia and she was recommended to follow up with hematology and nephrology for further evaluation.  Upon speaking with her daughter today, she notes that pt has had progressive fatigue, poor appetite and weight loss over the past year. She additionally noted 2w of dark stools prior to this admission at [facility]. The CT on admission (7/26) had additionally noted some tissue thickening around the cecum that was difficult to delineate as mass vs localized stool. She underwent colon cleansing and a repeat CT (7/30) re-demonstrated a lobulated mass in the cecum measuring 5.6cmx3.5cm. She underwent a colonoscopy on 8/2 at which time biopsy was obtained and was consistent with adenocarcinoma. CEA noted to be 575. General surgery was  consulted and is currently planning to take to OR for colectomy tomorrow, 8/4. Oncology consulted 8/4. Chest CT is notable for a 68mm subpleural nodule at the right lung apex in addition to small bilateral pleural effusions.   Clinical Assessment and Goals of Care:  I have reviewed medical records including EPIC notes, labs and imaging. I examined patient at bedside however, due to memory issues, asked me to continue discussions with her daughter  to discuss diagnosis prognosis, GOC, EOL wishes, disposition and options.  I introduced Palliative Medicine as specialized medical care for people living with serious illness. It focuses on providing relief from the symptoms and stress of a serious illness.   As far as functional and nutritional status, her daughter notes that patient has been slowly declining over the past year.  Her memory fluctuates but has deteriorated to the extent of her not knowing how to use the TV remote, forgetting how to use the restroom, and having difficulties with making decisions. She also notes the progressive fatigue, with pt now sleeping more often than she is awake.  She notes difficult with ambulation due to balance and shortness of breath. She began using a walker in May, and is only able to ambulate about 42ft prior to needing to rest.  She notes that her mom's appetite has also declined over the recent months resulting in roughly a 20# wt loss.  We discussed her current illness and what it means in the larger context of her on-going co-morbidities and natural disease trajectory. The difference between aggressive medical intervention and comfort care was considered in light of the patient's goals of care. Her daughter is struggling with whether to pursue surgery at this time or not. Her primary concern is that her mother would suffer with pain, n/v, and other bowel obstruction related sx if she does not undergo surgical resection. She recognizes that colectomy is a  fairly invasive procedure that may result in some morbidity of its own along with a prolonged recovery. We discussed the alternatives to treatment focused care including comfort focused care. I visited with her about  symptom management.  She is planning to revisit this discussion tomorrow morning to see if she is able to elicit any further information from [facility].  Advanced directives, concepts specific to code status, artifical feeding and hydration, and rehospitalization were considered and discussed.. She has a MOST form that was signed per her PCP (Dr. Delfina Redwood) as recently as 05/02/20 that notes that patient does not want antibiotics or IV fluids and would want strictly comfort measures. I discussed this with her daughter as well as it largely contradicts the trajectory of treatment that [name] is currently and will continue undergoing. Pt's daughter notes that this was signed prior to admission into the assisted living facility and was simply given as a document to sign, along with the FL2. She notes that the document was not reviewed with them, therefore leading them to think it was part of the FL2.  We revisited the MOST document and [name]'s wishes. Her daughter agrees with DNR code status however would want antibiotics and IVF. She implied of [name] wanting full scope of care however would not wish to remain on life support. We will continue to discuss more specific measures tomorrow, including filing an updated MOST form.  Hospice and Palliative Care services outpatient were explained and offered.  Questions and concerns were addressed.  The family was encouraged to call with questions or concerns.   Primary Decision Maker NEXT OF KIN    SUMMARY OF RECOMMENDATIONS    Will plan to continue with colectomy tomorrow. Daughter will continue discussions with her mother in the morning, pre-operatively. Given her co-morbidities and overall decline in the past year, suspect her recovery to be  complicated.   Appreciate oncology's input and hope to get some more insight into possible lung mets as this may additionally affect daughter/patient's decision making. As of right now, the daughter is under the impression that this is localized and that resection may be curative.   Will complete MOST form tomorrow morning, on meeting with patient's daughter.   Code Status/Advance Care Planning:  DNR  Symptom Management:   0.5mg  dilaudid prn  Palliative Prophylaxis:   Oral Care and Turn Reposition  Additional Recommendations (Limitations, Scope, Preferences):  n/a  Psycho-social/Spiritual:   Desire for further Chaplaincy support:yes  Additional Recommendations: n/a  Prognosis:    < 12 months  Discharge Planning: [place] for rehab with Palliative care service follow-up  Primary Diagnoses: Present on Admission: . GI bleeding . AKI (acute kidney injury) ([place]) . Biventricular cardiac pacemaker in situ . Gout . Permanent atrial fibrillation ([place]) . Liver cirrhosis ([place]) . CKD (chronic kidney disease) stage 3, GFR 30-59 ml/min . Chronic diastolic CHF (congestive heart  failure) ([place]) . Anemia . Orthostatic hypotension dysautonomic syndrome ([place]) . Sepsis secondary to UTI ([place]) . Hydronephrosis of left kidney . Left ureteral stone   I have reviewed the medical record, interviewed the patient and family, and examined the patient. The following aspects are pertinent.  Past Medical History:  Diagnosis Date  . Anemia   . Brady-tachy syndrome ([place])    a. 2003: post-op afib after MR repair then symptomatic bradycardia requiring pacemaker. b. Upgrade to Medtronic Bi-V Pacemaker 2007.  . Cellulitis and abscess of foot 08/15/2015   RT FOOT  . Chronic atrial fibrillation ([place])    a. Previously failed DCCV/amio/tikosyn.  CHADS2VASC score 5 - not on anticoagulation due to history of GI bleeding.    . Chronic combined systolic and diastolic CHF (congestive  heart failure) (H[facility])    a. EF 35-40% on echo 09/2013 b. echo 04/2016: EF 50-55% with mild to moderate MS.  . Cirrhosis ([place])    a. Newly recognized 09/2013 - by CT.  . CKD (chronic kidney disease)   . GI bleeding   . Hypertension   . Mitral valve disorder    a. Severe MR s/p repair 2003 (28 mm annuloplasty ring and and oversew of LAA). No CAD by cath at that time.  [name] NSVT (nonsustained ventricular tachycardia) ([place])    a. Isolated event during 09/2007 adm.  . NSVT (nonsustained ventricular tachycardia) ([place])   . Orthostatic hypotension dysautonomic syndrome ([place])   . Pulmonary hypertension (HCC)    Group II secondary to CHF and MV disease  . Spinal stenosis    shes recieved epidural steroid injections in the past  . Tricuspid regurgitation    Social History   Socioeconomic History  . Marital status: Single    Spouse name: Not on file  . Number of children: Not on file  . Years of education: Not on file  . Highest education level: Not on file  Occupational History  . Not on file  Tobacco Use  . Smoking status: Former Smoker    Quit date: 10/11/1971    Years since quitting: 48.7  . Smokeless tobacco: Never Used  Substance and Sexual Activity  . Alcohol use: Yes    Comment: rare  . Drug use: No  . Sexual activity: Not on file  Other Topics Concern  . Not on file  Social History Narrative  . Not on file   Social Determinants of Health   Financial Resource Strain:   . Difficulty of Paying Living Expenses:   Food Insecurity:   . Worried About Charity fundraiser in the Last Year:   . Arboriculturist in the Last Year:   Transportation Needs:   . Film/video editor (Medical):   [name] Lack of Transportation (Non-Medical):   Physical Activity:   . Days of Exercise per Week:   . Minutes of Exercise per Session:   Stress:   . Feeling of Stress :   Social Connections:   . Frequency of Communication with Friends and Family:   . Frequency of Social Gatherings with  Friends and Family:   . Attends Religious Services:   . Active Member of Clubs or Organizations:   . Attends Archivist Meetings:   [name] Marital Status:    Family History  Problem Relation Age of Onset  . Pancreatic cancer Father   . Other Mother        Mother died at 34 with no real medical problems  Scheduled Meds: . (feeding supplement) PROSource Plus  30 mL Oral BID BM  . [START ON 06/21/2020] acetaminophen  1,000 mg Oral On Call to OR  . [START ON 06/21/2020] alvimopan  12 mg Oral On Call to OR  . chlorhexidine  60 mL Topical Once   And  . [START ON 06/21/2020] chlorhexidine  60 mL Topical Once  . feeding supplement  1 Container Oral TID BM  . [START ON 06/21/2020] gabapentin  300 mg Oral On Call to OR  . midodrine  5 mg Oral TID WC  . pantoprazole  40 mg Oral BID  . senna-docusate  2 tablet Oral BID  . sodium chloride flush  3 mL Intravenous Q12H   Continuous Infusions: . [START ON 06/21/2020] cefoTEtan (CEFOTAN) IV    . lactated ringers     PRN Meds:.acetaminophen **OR** acetaminophen, acetaminophen, HYDROmorphone (DILAUDID) injection, metoprolol tartrate, ondansetron (ZOFRAN) IV, oxyCODONE, promethazine Medications Prior to Admission:  Prior to Admission medications   Medication Sig Start Date End Date Taking? Authorizing Provider  Febuxostat 80 MG TABS Take 80 mg by mouth daily.   Yes [provider]  ferrous sulfate 325 (65 FE) MG tablet Take 1 tablet (325 mg total) by mouth 2 (two) times daily with a meal. 05/22/16  Yes Strader, [place] M, PA-C  midodrine (PROAMATINE) 5 MG tablet Take 1 tablet (5 mg total) by mouth 3 (three) times daily with meals. 05/23/20  Yes Turner, Eber Hong, MD  pantoprazole (PROTONIX) 40 MG tablet Take 40 mg by mouth every morning. 05/30/20  Yes [provider]  potassium chloride SA (KLOR-CON) 20 MEQ tablet Take 1 tablet (20 mEq total) by mouth daily. 04/07/20  Yes Vann, Jessica U, DO  torsemide (DEMADEX) 20 MG tablet Take 1 tablet  (20 mg total) by mouth daily. 04/26/20  Yes Turner, Eber Hong, MD  cetirizine (ZYRTEC) 10 MG tablet Take 10 mg by mouth daily.    [provider]  Multiple Vitamins-Minerals (OCUVITE ADULT 50+) CAPS Take 1 capsule by mouth daily.    [provider]   No Known Allergies Review of Systems: unable to be obtained due to encephalopathy   Vital Signs: BP 116/65 (BP Location: Left Arm)   Pulse 69   Temp 98 F (36.7 C) (Oral)   Resp 20   Ht 5\' 2"  (1.575 m)   Wt 90 kg   LMP 10/10/2013   SpO2 99%   BMI 36.29 kg/m  Pain Scale: 0-10   Pain Score: 0-No pain   General: chronically ill appearing female HEENT: no temporal wasting appreciable.  Cardiac: extremities warm Pulm: breathing comfortably on room air. No wheezes or cough Abd: obese, no tenderness to palpation Neuro: awake and alert. Oriented to person and place but not to time or situation. No focal deficits appreciable on limited exam. Skin: no rash   SpO2: SpO2: 99 % O2 Flow Rate: RA IO: Intake/output summary:   Intake/Output Summary (Last 24 hours) at 06/20/2020 1608 Last data filed at 06/20/2020 6644 Gross per 24 hour  Intake 240 ml  Output --  Net 240 ml    LBM: Last BM Date: 06/20/20 Baseline Weight: Weight: 88 kg Most recent weight: Weight: 90 kg     Palliative Assessment/Data: 40%     Thank you for this consult. Palliative medicine will continue to follow and assist as needed.    Mitzi Hansen, MD Internal Medicine Resident PGY-2 [facility] Pager: [phone] 06/20/2020 4:08 PM   Leonard Downing  [facility] [phone] Palliative team phone See Amion for pager information      Please contact Palliative Medicine Team phone at [phone] for questions and concerns.  For individual provider: See Amion  Time in: 1500 Time out: 1630 Total time: 90 minutes

## 2020-06-20 NOTE — Consult Note (Signed)
The pt's nurse asked me to visit. The pt was sitting up in bed. Her daughter was at bedside. The daughter said that pt was a little HOH. Pt said that she was going to have surgery tomorrow. The chaplain offered caring and supportive presence. We sang one of the pt's favorite hymn In [place] Garden all three verses. I offered prayers and blessings. Follow-up visits will be offered.

## 2020-06-20 NOTE — Progress Notes (Signed)
[facility] Progress Note  2 Days Post-Op  Subjective: CC-  Doing well this morning. Denies abdominal pain, nausea, vomiting. Tolerating clear liquids. She had a few BMs yesterday, unsure of consistency. Ok with proceeding with surgery tomorrow.  Objective: Vital signs in last 24 hours: Temp:  [97.6 F (36.4 C)-98.4 F (36.9 C)] 97.6 F (36.4 C) (08/04 0516) Pulse Rate:  [69-71] 71 (08/04 0516) Resp:  [20-24] 20 (08/04 0516) BP: (100-130)/(42-80) 100/42 (08/04 0516) SpO2:  [99 %] 99 % (08/04 0516) Weight:  [90 kg] 90 kg (08/04 0500) Last BM Date: 06/18/20  Intake/Output from previous day: 08/03 0701 - 08/04 0700 In: 360 [P.O.:360] Out: -  Intake/Output this shift: No intake/output data recorded.  PE: Gen:  Alert, NAD, pleasant HEENT: EOM's intact, pupils equal and round Card:  RRR Pulm:  CTAB, no W/R/R, rate and effort normal Ext: calves soft and nontender Abd: Soft, ND, NT, +BS, no HSM, no hernia Skin: no rashes noted, warm and dry   Lab Results:  Recent Labs    06/17/20 0938 06/20/20 0604  WBC 6.0 5.3  HGB 9.2* 8.7*  HCT 32.1* 30.8*  PLT 220 185   BMET Recent Labs    06/17/20 0938 06/20/20 0604  NA 143 141  K 3.9 3.9  CL 115* 114*  CO2 21* 21*  GLUCOSE 110* 86  BUN 16 13  CREATININE 1.42* 1.27*  CALCIUM 7.7* 7.7*   PT/INR Recent Labs    06/19/20 0537 06/20/20 0604  LABPROT 14.6 14.6  INR 1.2 1.2   CMP     Component Value Date/Time   NA 141 06/20/2020 0604   NA 146 (H) 05/30/2019 1332   K 3.9 06/20/2020 0604   CL 114 (H) 06/20/2020 0604   CO2 21 (L) 06/20/2020 0604   GLUCOSE 86 06/20/2020 0604   BUN 13 06/20/2020 0604   BUN 25 05/30/2019 1332   CREATININE 1.27 (H) 06/20/2020 0604   CREATININE 1.49 (H) 07/30/2016 1509   CALCIUM 7.7 (L) 06/20/2020 0604   PROT 4.9 (L) 06/20/2020 0604   ALBUMIN 2.2 (L) 06/20/2020 0604   AST 16 06/20/2020 0604   ALT 12 06/20/2020 0604   ALKPHOS 87 06/20/2020 0604   BILITOT 0.7  06/20/2020 0604   GFRNONAA 37 (L) 06/20/2020 0604   GFRAA 43 (L) 06/20/2020 0604   Lipase     Component Value Date/Time   LIPASE 27 06/11/2020 0123       Studies/Results: No results found.  Anti-infectives: Anti-infectives (From admission, onward)   Start     Dose/Rate Route Frequency Ordered Stop   06/14/20 1000  amoxicillin (AMOXIL) capsule 500 mg        500 mg Oral Every 12 hours 06/14/20 0741 06/19/20 0959   06/11/20 1030  cefTRIAXone (ROCEPHIN) 1 g in sodium chloride 0.9 % 100 mL IVPB  Status:  Discontinued        1 g 200 mL/hr over 30 Minutes Intravenous Every 24 hours 06/11/20 1021 06/14/20 0741   06/11/20 0300  cefTRIAXone (ROCEPHIN) 1 g in sodium chloride 0.9 % 100 mL IVPB        1 g 200 mL/hr over 30 Minutes Intravenous  Once 06/11/20 0259 06/11/20 0458       Assessment/Plan SIRS secondary to UTI/GI bleed Tachy-Brady syndrome - PTVP 11/2014 Severe MR s/p repair with 28 mm annuloplasty ring/oversew LAA, 2003 Hx NSVT/chronic atrial fibrillation-not on anticoagulation secondary to GI bleed Hx CHF Hx pulmonary hypertension Hx anemia Hx CKD with  AKI Hx gastritis Hx recent balloon dilatation [name] Hx spinal stenosis Hx hypertension/hypertension-on midodrine at home Hydronephrosis with cystoscopy/left retrograde pyelogram/insertion of left double-J stent 06/11/2020;Dr. Jeffie Pollock Hx remote tobacco use Malnutrition - prealbumin 11 (8/3)  Cecal Mass - adenocarcinoma - colonoscopy 06/18/20, Dr. Michail Sermon  >> path: adenocarcinoma - CEA 575 (8/3)  FEN: IV fluids/clear liquids, Boost breeze, NPO after midnight ID: Rocephin 7/26-7/28; amoxicillin 7/29 >>8/3 DVT: SCD's, ok for chemical DVT prophylaxis from surgical standpoint Follow-up: TBD  Plan: Continue clear liquids today, NPO after midnight. Plan for laparoscopic assisted right colectomy in OR tomorrow.   LOS: 9 days    Wellington Hampshire, [facility] Surgery 06/20/2020, 8:54 AM Please  see Amion for pager number during day hours 7:00am-4:30pm

## 2020-06-20 NOTE — Consult Note (Signed)
[place] [phone]   Fax:([phone] [place] NOTE  Patient Care Team: [name], MD as PCP - General (Internal Medicine) Sueanne Margarita, MD as PCP - Cardiology (Cardiology)  Hematological/Oncological History # Newly Diagnosed Adenocarcinoma of the Colon 1) 06/11/2020: presented to the ED with painful urination and abdominal pain. CT abdomen showed moderate hydronephrosis with calculi in the mid ureteral region. Concern for mass-like appearance in cecal area.    2) 06/15/2020:  CT A/P showed a 5.6 x 3.5 cm lobulated mass in the cecum as well as left sided hydronephrosis.  3) 06/18/2020: colonoscopy was performed, biopsy showed lesion to be adenocarcinoma  4) 06/20/2020: establish care with Dr. Lorenso Courier while in hospital.  5) 06/21/2020: scheduled for surgical resection of colonic adenocarcinoma.   CHIEF COMPLAINTS/PURPOSE OF CONSULTATION:  "New Diagnosis of Colon Cancer "  HISTORY OF PRESENTING ILLNESS:  [name] 86 y.o. female with medical history significant for atrial fibrillation, HTN, pulmonary hypertension, CKD, CHF and cirrhosis who presents for evaluation of newly diagnosed colon cancer.   On review of the previous records [name] presented to the emergency department on 06/11/2020 at which time she was having painful urination and abdominal pain.  She had a CT scan of the abdomen done which showed moderate hydronephrosis with calculi in the mid ureteral region and concern for masslike appearance in the cecal area.  On 06/15/2020 she further had a CT abdomen pelvis which revealed a 5.6 x 3.5 cm lobulated mass in the cecum as well as improved left-sided hydronephrosis following a stent placement by urology on 7/26.  A colonoscopy was performed on 06/18/2020 which showed the lesion to be adenocarcinoma.  Due to concern for this new diagnosis the patient was referred to oncology for further evaluation and management.  On exam today [name] is accompanied by her daughter at bedside.  She recounts a prior episode where she had decreased blood pressure and was standing at the microwave and collapsed in May 2021.  She notes that she was admitted at that time and thought to have acute renal failure and anemia on top of that.  The patient reports that she does have abdominal pain and that she has frequent uncontrollable dark bowel movements.  She does of these are mostly loose.  She has been taking iron pills approximately twice daily since her discharge from the hospital in May.  The patient notes that she is not seen any bright red blood in her stool.  She also notes that she has not been having any trouble with constipation.    On further discussion she reports that her father did have a history of pancreatic cancer but no other cancers within the family.  The patient is a former smoker but quit 50 years ago.  She notes that she only smoked for about 10 years about 1 pack/day.  She very seldom drinks alcohol and notes that she was never particularly heavy drinker.  Her peak weight was 245 pounds, however on exam today she is 200 pounds.  There is some concern this may be partially due to fluid overload as she has stopped her diuretics.  Additionally her daughter notes that she weighed as low as 188 when at home.  She currently denies having any issues with fevers, chills, sweats, nausea, vomiting or chest pain.  A full 10 point ROS is listed below.  MEDICAL HISTORY:  Past Medical History:  Diagnosis Date  . Anemia   .  Brady-tachy syndrome ([place])    a. 2003: post-op afib after MR repair then symptomatic bradycardia requiring pacemaker. b. Upgrade to Medtronic Bi-V Pacemaker 2007.  . Cellulitis and abscess of foot 08/15/2015   RT FOOT  . Chronic atrial fibrillation ([place])    a. Previously failed DCCV/amio/tikosyn.  CHADS2VASC score 5 - not on anticoagulation due to history of GI bleeding.    . Chronic combined systolic and diastolic CHF  (congestive heart failure) (HCC)    a. EF 35-40% on echo 09/2013 b. echo 04/2016: EF 50-55% with mild to moderate MS.  . Cirrhosis ([place])    a. Newly recognized 09/2013 - by CT.  . CKD (chronic kidney disease)   . GI bleeding   . Hypertension   . Mitral valve disorder    a. Severe MR s/p repair 2003 (28 mm annuloplasty ring and and oversew of LAA). No CAD by cath at that time.  [name] NSVT (nonsustained ventricular tachycardia) ([place])    a. Isolated event during 09/2007 adm.  . NSVT (nonsustained ventricular tachycardia) ([place])   . Orthostatic hypotension dysautonomic syndrome ([place])   . Pulmonary hypertension (HCC)    Group II secondary to CHF and MV disease  . Spinal stenosis    shes recieved epidural steroid injections in the past  . Tricuspid regurgitation     SURGICAL HISTORY: Past Surgical History:  Procedure Laterality Date  . APPENDECTOMY    . BALLOON DILATION N/A 06/05/2020   Procedure: BALLOON DILATION;  Surgeon: Otis Brace, MD;  Location: [facility];  Service: Gastroenterology;  Laterality: N/A;  . BIOPSY  06/05/2020   Procedure: BIOPSY;  Surgeon: Otis Brace, MD;  Location: [facility];  Service: Gastroenterology;;  . BIOPSY  06/18/2020   Procedure: BIOPSY;  Surgeon: Wilford Corner, MD;  Location: [facility];  Service: Endoscopy;;  . BIV PACEMAKER GENERATOR CHANGE OUT N/A 12/13/2014   Procedure: BIV PACEMAKER GENERATOR CHANGE OUT;  Surgeon: Evans Lance, MD;  Location: [facility] CATH LAB;  Service: Cardiovascular;  Laterality: N/A;  . CHOLECYSTECTOMY    . COLONOSCOPY N/A 06/18/2020   Procedure: COLONOSCOPY;  Surgeon: Wilford Corner, MD;  Location: [facility];  Service: Endoscopy;  Laterality: N/A;  Either to be done by Dr. Michail Sermon or Dr. Paulita Fujita  . CYSTOSCOPY WITH STENT PLACEMENT Left 06/11/2020   Procedure: CYSTOSCOPY WITH STENT PLACEMENT;  Surgeon: Irine Seal, MD;  Location: [facility];  Service: Urology;  Laterality: Left;  . ESOPHAGOGASTRODUODENOSCOPY (EGD) WITH  PROPOFOL N/A 06/05/2020   Procedure: ESOPHAGOGASTRODUODENOSCOPY (EGD) WITH PROPOFOL;  Surgeon: Otis Brace, MD;  Location: [facility];  Service: Gastroenterology;  Laterality: N/A;  . IRRIGATION AND DEBRIDEMENT ABSCESS N/A 10/14/2013   Procedure: IRRIGATION AND DEBRIDEMENT PERINEAL ABSCESS;  Surgeon: Rolm Bookbinder, MD;  Location: [facility];  Service: General;  Laterality: N/A;  . SUBMUCOSAL TATTOO INJECTION  06/18/2020   Procedure: SUBMUCOSAL TATTOO INJECTION;  Surgeon: Wilford Corner, MD;  Location: [facility];  Service: Endoscopy;;  . VALVE REPLACEMENT     sever mitral regurgitation s/p mitral valve annuloplasty ring    SOCIAL HISTORY: Social History   Socioeconomic History  . Marital status: Single    Spouse name: Not on file  . Number of children: Not on file  . Years of education: Not on file  . Highest education level: Not on file  Occupational History  . Not on file  Tobacco Use  . Smoking status: Former Smoker    Quit date: 10/11/1971    Years since quitting: 48.7  . Smokeless tobacco:  Never Used  Substance and Sexual Activity  . Alcohol use: Yes    Comment: rare  . Drug use: No  . Sexual activity: Not on file  Other Topics Concern  . Not on file  Social History Narrative  . Not on file   Social Determinants of Health   Financial Resource Strain:   . Difficulty of Paying Living Expenses:   Food Insecurity:   . Worried About Charity fundraiser in the Last Year:   . Arboriculturist in the Last Year:   Transportation Needs:   . Film/video editor (Medical):   [name] Lack of Transportation (Non-Medical):   Physical Activity:   . Days of Exercise per Week:   . Minutes of Exercise per Session:   Stress:   . Feeling of Stress :   Social Connections:   . Frequency of Communication with Friends and Family:   . Frequency of Social Gatherings with Friends and Family:   . Attends Religious Services:   . Active Member of Clubs or Organizations:   . Attends  Archivist Meetings:   [name] Marital Status:   Intimate Partner Violence:   . Fear of Current or Ex-Partner:   . Emotionally Abused:   [name] Physically Abused:   . Sexually Abused:     FAMILY HISTORY: Family History  Problem Relation Age of Onset  . Pancreatic cancer Father   . Other Mother        Mother died at 84 with no real medical problems    ALLERGIES:  has No Known Allergies.  MEDICATIONS:  Current Facility-Administered Medications  Medication Dose Route Frequency Provider Last Rate Last Admin  . (feeding supplement) PROSource Plus liquid 30 mL  30 mL Oral BID BM Eugenie Filler, MD      . acetaminophen (TYLENOL) tablet 650 mg  650 mg Oral Q6H PRN Irine Seal, MD   650 mg at 06/13/20 1640   Or  . acetaminophen (TYLENOL) suppository 650 mg  650 mg Rectal Q6H PRN Irine Seal, MD      . acetaminophen (TYLENOL) tablet 1,000 mg  1,000 mg Oral Q6H PRN Irine Seal, MD      . feeding supplement (BOOST / RESOURCE BREEZE) liquid 1 Container  1 Container Oral TID BM Meuth, Brooke A, PA-C   1 Container at 06/20/20 [phone]  . HYDROmorphone (DILAUDID) injection 0.5 mg  0.5 mg Intravenous Q3H PRN Irine Seal, MD   0.5 mg at 06/12/20 2008  . lactated ringers infusion   Intravenous Continuous Wilford Corner, MD      . metoprolol tartrate (LOPRESSOR) injection 5 mg  5 mg Intravenous Q6H PRN Irine Seal, MD      . midodrine (PROAMATINE) tablet 5 mg  5 mg Oral TID WC Irine Seal, MD   5 mg at 06/20/20 0837  . ondansetron (ZOFRAN) injection 4 mg  4 mg Intravenous Q6H PRN Lang Snow, FNP   4 mg at 06/14/20 2144  . oxyCODONE (Oxy IR/ROXICODONE) immediate release tablet 2.5 mg  2.5 mg Oral Q4H PRN Baron-Johnson, Alison, PA-C      . pantoprazole (PROTONIX) EC tablet 40 mg  40 mg Oral BID Hosie Poisson, MD   40 mg at 06/20/20 0934  . promethazine (PHENERGAN) injection 12.5 mg  12.5 mg Intravenous Q6H PRN Hosie Poisson, MD   12.5 mg at 06/14/20 1340  . senna-docusate (Senokot-S) tablet 2  tablet  2 tablet Oral BID Desiree Hane, MD  2 tablet at 06/20/20 0934  . sodium chloride flush (NS) 0.9 % injection 3 mL  3 mL Intravenous Q12H Irine Seal, MD   3 mL at 06/20/20 8182    REVIEW OF SYSTEMS:   Constitutional: ( - ) fevers, ( - )  chills , ( - ) night sweats Eyes: ( - ) blurriness of vision, ( - ) double vision, ( - ) watery eyes Ears, nose, mouth, throat, and face: ( - ) mucositis, ( - ) sore throat Respiratory: ( - ) cough, ( - ) dyspnea, ( - ) wheezes Cardiovascular: ( - ) palpitation, ( - ) chest discomfort, ( - ) lower extremity swelling Gastrointestinal:  ( - ) nausea, ( - ) heartburn, ( - ) change in bowel habits Skin: ( - ) abnormal skin rashes Lymphatics: ( - ) new lymphadenopathy, ( - ) easy bruising Neurological: ( - ) numbness, ( - ) tingling, ( - ) new weaknesses Behavioral/Psych: ( - ) mood change, ( - ) new changes  All other systems were reviewed with the patient and are negative.  PHYSICAL EXAMINATION: ECOG PERFORMANCE STATUS: 2 - Symptomatic, <50% confined to bed  Vitals:   06/19/20 2116 06/20/20 0516  BP: (!) 129/50 (!) 100/42  Pulse: 69 71  Resp: (!) 24 20  Temp: 97.7 F (36.5 C) 97.6 F (36.4 C)  SpO2: 99% 99%   Filed Weights   06/18/20 0551 06/19/20 0600 06/20/20 0500  Weight: 196 lb 6.4 oz (89.1 kg) 208 lb 5.4 oz (94.5 kg) 198 lb 6.6 oz (90 kg)    GENERAL: well appearing elderly overweight Caucasian female in NAD  SKIN: skin color, texture, turgor are normal, no rashes or significant lesions EYES: conjunctiva are pink and non-injected, sclera clear LUNGS: clear to auscultation and percussion with normal breathing effort HEART: regular rate & rhythm and no murmurs and no lower extremity edema ABDOMEN: soft, non-tender, non-distended, normal bowel sounds Musculoskeletal: no cyanosis of digits and no clubbing  PSYCH: alert & oriented x 3, fluent speech NEURO: no focal motor/sensory deficits  LABORATORY DATA:  I have reviewed the  data as listed CBC Latest Ref Rng & Units 06/20/2020 06/17/2020 06/15/2020  WBC 4.0 - 10.5 K/uL 5.3 6.0 6.8  Hemoglobin 12.0 - 15.0 g/dL 8.7(L) 9.2(L) 8.6(L)  Hematocrit 36 - 46 % 30.8(L) 32.1(L) 29.8(L)  Platelets 150 - 400 K/uL 185 220 209    CMP Latest Ref Rng & Units 06/20/2020 06/17/2020 06/16/2020  Glucose 70 - 99 mg/dL 86 110(H) 84  BUN 8 - 23 mg/dL '13 16 22  ' Creatinine 0.44 - 1.00 mg/dL 1.27(H) 1.42(H) 1.37(H)  Sodium 135 - 145 mmol/L 141 143 145  Potassium 3.5 - 5.1 mmol/L 3.9 3.9 3.7  Chloride 98 - 111 mmol/L 114(H) 115(H) 117(H)  CO2 22 - 32 mmol/L 21(L) 21(L) 21(L)  Calcium 8.9 - 10.3 mg/dL 7.7(L) 7.7(L) 7.8(L)  Total Protein 6.5 - 8.1 g/dL 4.9(L) - -  Total Bilirubin 0.3 - 1.2 mg/dL 0.7 - -  Alkaline Phos 38 - 126 U/L 87 - -  AST 15 - 41 U/L 16 - -  ALT 0 - 44 U/L 12 - -     PATHOLOGY: SURGICAL PATHOLOGY  * THIS IS AN ADDENDUM REPORT * CASE: [id number]  PATIENT: [name]  Surgical Pathology Report  **Addendum **  Reason for Addendum #1: DNA Mismatch Repair IHC Results   Clinical History: Abnormal CT anemia guaiac positive (crm)    FINAL MICROSCOPIC DIAGNOSIS:  A. COLON, CECUM, BIOPSY:  - Adenocarcinoma.   COMMENT:   Dr. Melina Copa has reviewed the case. MMR will be ordered. Case called to  Dr. Kathline Magic nurse on 06/19/2020.    GROSS DESCRIPTION:   Received in formalin are tan, soft tissue fragments that are submitted  in toto. Number: 3 size: Range from 0.3 to 0.4 cm blocks: 1 Craig Staggers  06/20/2020)   Final Diagnosis performed by Vicente Males, MD.  Electronically signed  06/19/2020  Technical and / or Professional components performed at [facility], [place] [address] [place], [place] [address].  Immunohistochemistry Technical component (if applicable) was performed  at [facility]. [address], [place],  [place], [place] [address].  IMMUNOHISTOCHEMISTRY DISCLAIMER (if applicable):  Some of these immunohistochemical  stains may have been developed and the  performance characteristics determine by [facility]. Some  may not have been cleared or approved by the U.S. Food and Drug  Administration. The FDA has determined that such clearance or approval  is not necessary. This test is used for clinical purposes. It should not  be regarded as investigational or for research. This laboratory is  certified under the [place]  (CLIA-88) as qualified to perform high complexity clinical laboratory  testing. The controls stained appropriately.   ADDENDUM:   Mismatch Repair Protein (IHC)   SUMMARY INTERPRETATION: ABNORMAL   There is loss of the major and minor MMR proteins MLH1 and PMS2. The  loss of expression may be secondary to promoter hyper-methylation, gene  mutation or other genetic event. BRAF mutation testing and/or MLH1  methylation testing is indicated. The presence of a BRAF mutation and/or  MLH1 hypermethylation is indicative of a sporadic-type tumor. The  absence of either BRAF mutation and/or presence of normal methylation  indicate the possible presence of a hereditary germline mutation (e.g.  Lynch syndrome) and referral to genetic counseling is warranted. It is  recommended that the loss of protein expression be correlated with  molecular based MSI testing.   IHC EXPRESSION RESULTS   TEST   RESULT  MLH1:     LOSS OF NUCLEAR EXPRESSION  MSH2:     Preserved nuclear expression  MSH6:     Preserved nuclear expression  PMS2:     LOSS OF NUCLEAR EXPRESSION   References:  1. Guidelines on Genetic Evaluation and Management of Lynch Syndrome: A  Consensus Statement by the [place] Multi-Society Task Force on Colorectal  Cancer Gae Dry. Sherlie Ban , MD, and other . Am Nicki [place] 2014;  [id number]; doi: 10.1038/ajg.2014.186; published online 07 June 2013  2. Outcomes of screening endometrial cancer patients for  Lynch syndrome  by patient-administered checklist. Olena Heckle MS, and others. Gynecol Oncol  2013;131(3):619-623.  3. Muir-Torre syndrome (MTS): An update and approach to diagnosis and  management. Shelly Flatten, MD and others. J Am Acad Dermatol  [phone].    RADIOGRAPHIC STUDIES: I have personally reviewed the radiological images as listed and agreed with the findings in the report: mass in the cecum, no other evidence of metastatic disease in the abdomen.  CT ABDOMEN PELVIS WO CONTRAST  Result Date: 06/15/2020 CLINICAL DATA:  86 year old female with concern for cecal stool versus mass. EXAM: CT ABDOMEN AND PELVIS WITHOUT CONTRAST TECHNIQUE: Multidetector CT imaging of the abdomen and pelvis was performed following the standard protocol without IV contrast. COMPARISON:  CT abdomen pelvis dated 06/11/2020. FINDINGS: Evaluation of this exam is limited in the absence of intravenous  contrast. Lower chest: Small bilateral pleural effusions with associated bibasilar subsegmental atelectasis. Pneumonia is not excluded. Clinical correlation is recommended. There is mild cardiomegaly. Coronary vascular calcification, cardiac pacemaker wires, and mechanical mitral valve. There is hypoattenuation of the cardiac blood pool suggestive of anemia. Clinical correlation is recommended. No intra-abdominal free air or free fluid. Hepatobiliary: Morphologic changes of cirrhosis. No intrahepatic biliary ductal dilatation. Cholecystectomy. No retained calcified stone noted in the central CBD. Pancreas: The pancreas is unremarkable as visualized. Spleen: There is a 2.5 cm indeterminate hypodense lesion in the spleen which demonstrates fluid attenuation, likely a cyst or hemangioma. Small calcified splenic granuloma. Adrenals/Urinary Tract: The adrenal glands unremarkable. There is a left ureteral stent with proximal tip in the upper pole collecting system and distal end within the urinary bladder. There is mild left  hydronephrosis. There is a 5 mm stone in the proximal left ureter along the course of the stent. A curvilinear calculus in the region of the left renal pelvis likely combination of the previously seen stone and the stone noted previously in the inferior pole of the left kidney. No stone identified within the left kidney. There is a 4 mm linear nonobstructing right renal inferior pole calculus as well as several additional punctate nonobstructing calculi. There is no hydronephrosis on the right. Subcentimeter bilateral renal lesions are not characterized on this CT. There is mild bilateral renal parenchyma atrophy. The right ureter is unremarkable. The urinary bladder is grossly unremarkable. Air within the bladder likely related to recent instrumentation. Stomach/Bowel: There is loose stool noted throughout the colon. There is a 5.6 x 3.5 cm lobulated mass in the cecum. Further evaluation with colonoscopy recommended. There is sigmoid diverticulosis without active inflammatory changes. There is no bowel obstruction. Appendectomy. Vascular/Lymphatic: Advanced aortoiliac atherosclerotic disease. The IVC is unremarkable. No portal venous gas. 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 There is no adenopathy. Reproductive: Small calcified uterine fibroids. Other: Mild subcutaneous edema. Musculoskeletal: Osteopenia with degenerative changes of the spine. No acute osseous pathology. IMPRESSION: 1. Lobulated mass in the cecum concerning for malignancy. Further evaluation with colonoscopy recommended. No bowel obstruction. 2. Left ureteral stent with mild left hydronephrosis. A 5 mm proximal left ureteral stone as well as a curvilinear calculus in the region of the left renal pelvis. 3. Sigmoid diverticulosis. 4. Cirrhosis. 5. 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 This is relatively similar to CTs dating back to 2014. CT with IV contrast may provide better evaluation on a  nonemergent/outpatient basis, if clinically indicated. 6. Small bilateral pleural effusions with associated bibasilar subsegmental atelectasis versus infiltrate. 7. Aortic Atherosclerosis (ICD10-I70.0). Electronically Signed   By: Anner Crete M.D.   On: 06/15/2020 18:33   CT ABDOMEN PELVIS WO CONTRAST  Result Date: 06/11/2020 CLINICAL DATA:  Abdominal pain with fever EXAM: CT ABDOMEN AND PELVIS WITHOUT CONTRAST TECHNIQUE: Multidetector CT imaging of the abdomen and pelvis was performed following the standard protocol without IV contrast. Oral contrast was administered. COMPARISON:  October 10, 2013 FINDINGS: Lower chest: There is bibasilar atelectasis. Pacemaker leads are attached to the right atrium and right ventricle. Evidence of prosthetic mitral valve. Hepatobiliary: The liver contour is subtly nodular, a finding also noted previously. No focal liver lesions are appreciable on this noncontrast enhanced study. Gallbladder absent. There is no appreciable biliary duct dilatation. Pancreas: There is no pancreatic mass or inflammatory focus. Spleen: There is a cyst arising from the spleen measuring 3.4 x 3.2 cm.  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 Adrenals/Urinary Tract: Adrenals bilaterally appear unremarkable. There are suspected hyperdense cysts in the right kidney, largest measuring 9 x 9 mm. There is a 1.5 x 1.2 cm cyst arising from the upper pole of the left kidney. There is moderate hydronephrosis on the left. There is no hydronephrosis on the right. There is a 7 x 3 mm calculus in the lower pole of the left kidney. There is a 2 mm calculus with a nearby 1 mm calculus in the lower pole of the right kidney. There is a 1 mm calculus in the mid right kidney. There is a calculus in the proximal left ureter at the L4 level measuring 7 x 5 mm with an adjacent 2 mm calculus in this region. At the upper sacral level, there is an additional calculus in the left ureter  measuring 7 x 4 mm. No ureteral calculus evident on the right. Urinary bladder is midline with wall thickness within normal limits. Stomach/Bowel: There are sigmoid diverticula without diverticulitis. There is a soft tissue masslike area in the cecum measuring 4.9 x 4.2 cm which has attenuation value slightly higher than is expected with serous fluid. There is no appreciable bowel wall or mesenteric thickening. There is no evident bowel obstruction. There is no free air or portal venous air evident. Vascular/Lymphatic: There is aortic and iliac artery atherosclerosis. No aneurysm is evident in the aorta. There is no appreciable adenopathy in the abdomen or pelvis. Reproductive: The uterus is anteverted. There are several intrauterine calcifications, likely due to leiomyomatous change. No extrauterine pelvic mass evident. Other: The appendix is absent. No periappendiceal region inflammation. No abscess or ascites is evident in the abdomen or pelvis. There is mesenteric thickening in the mid abdomen without bowel or vascular distortion. Musculoskeletal: Bones are osteoporotic. There is stable anterior wedging at L4. There is a degree of spinal stenosis at L2-3 and L3-4 due to bony hypertrophy. No blastic or lytic lesions are evident. There are no intramuscular lesions. IMPRESSION: 1. Moderate hydronephrosis on the left. There are calculi in the mid ureteral region on the left. A calculus at the level of L4 measures 7 x 5 mm with an adjacent 2 mm calculus. There is a 7 x 4 mm calculus at the upper sacral level on the left as well. 2. Intrarenal calculi bilaterally. Probable hyperdense cysts in right kidney. 3. Soft tissue fullness in the cecal region. This area may represent localized stool. Given the somewhat masslike appearance in this area, it may be reasonable to consider direct visualization after appropriate colonic cleansing. 4. 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 5.  Apparent degree of hepatic cirrhosis. 7.  Gallbladder absent. 8.  Aortic Atherosclerosis (ICD10-I70.0). 9.  Suspected small calcified uterine leiomyomas. 10. Degree of spinal stenosis at L2-3 and L3-4 due to bony hypertrophy. Electronically Signed   By: Lowella Grip III M.D.   On: 06/11/2020 05:08   DG Chest Port 1 View  Result Date: 06/11/2020 CLINICAL DATA:  Shortness of breath and fevers EXAM: PORTABLE CHEST 1 VIEW COMPARISON:  04/04/2020 FINDINGS: Cardiac shadow is enlarged. Postsurgical changes are again seen. Pacemaker is again noted and stable. Chronic blunting of left costophrenic angle is noted. No focal infiltrate is seen. No bony abnormality is noted. IMPRESSION: Chronic changes without acute abnormality. Electronically Signed   By: Inez Catalina M.D.   On: 06/11/2020 01:42   DG C-Arm 1-60  Min-No Report  Result Date: 06/11/2020 Fluoroscopy was utilized by the requesting physician.  No radiographic interpretation.    ASSESSMENT & PLAN Azalia Neuberger Nand 86 y.o. female with medical history significant for atrial fibrillation, HTN, pulmonary hypertension, CKD, CHF and cirrhosis who presents for evaluation of newly diagnosed adenocarcinoma of the colon.  After review the labs, review the imaging, review the pathology, discussion with the patient her findings most consistent with a newly diagnosed adenocarcinoma of the colon.  At this time I agree with surgical management given the patient has been fully briefed on the risks and benefits of this procedure at her advanced age with her comorbidities.  Once the surgical resection is completed and we have full pathological staging with lymph nodes, tumor invasion depth, and MSI status we will be able to make recommendations regarding adjuvant therapy.  I am concerned the patient may not be a good candidate for systemic treatment, but would be willing to consider it after she makes a recovery from her surgery.    In addition to the colon  cancer she also has iron deficiency anemia secondary to bleeding from this tumor.  I would recommend that IV Feraheme 510 mg administered while she is in house and subsequently she be discharged with 3 to 25 mg p.o. daily ferrous sulfate in order to help increase her Hgb.   Oncology will continue to follow this patient peripherally while she is in house.  #Adenocarcinoma of the Colon --newly diagnosed mass in the colon, measuring 5.6 x 3.5 cm. Patient is currently scheduled for surgical resection to be performed tomorrow. --will once pathological staging of tumor is complete we can discuss adjuvant treatment options in clinic.  --CT of the chest reveals a 10 mm subpleural rounded density seen posteriorly in the right lung apex. Will need to follow with serial imaging.  --patient is a borderline candidate for chemotherapy. Will need to evaluate post operative to determine if further adjuvant treatment is warranted/feasible. --Oncology will continue to follow this patient while she is in house.  --placeholder clinic visit scheduled for 1 month following her surgery.   #Iron Deficiency 2/2 to Colon Cancer Bleeding --noted on prior labs from 03/2020 with no improvement in Hgb on PO iron therapy -- can consider administering IV feraheme 545m while in house. Please clear this with surgery prior to administration --like source of bleeding is the patient's colon mass.  --can restart ferrous sulfate 3230mPO daily at time of d/c    All questions were answered. The patient knows to call the clinic with any problems, questions or concerns.  A total of more than 55 minutes were spent on this encounter and over half of that time was spent on counseling and coordination of care as outlined above.   JoLedell PeoplesMD Department of Hematology/Oncology CoJayuyat WeThe Ridge Behavioral Health Systemhone: [phone]: [phone]: [email or web address]  06/20/2020 12:40 PM

## 2020-06-21 DIAGNOSIS — Z515 Encounter for palliative care: Secondary | ICD-10-CM | POA: Diagnosis not present

## 2020-06-21 DIAGNOSIS — Z7189 Other specified counseling: Secondary | ICD-10-CM | POA: Diagnosis not present

## 2020-06-21 DIAGNOSIS — N390 Urinary tract infection, site not specified: Secondary | ICD-10-CM | POA: Diagnosis not present

## 2020-06-21 DIAGNOSIS — D500 Iron deficiency anemia secondary to blood loss (chronic): Secondary | ICD-10-CM

## 2020-06-21 HISTORY — DX: Iron deficiency anemia secondary to blood loss (chronic): D50.0

## 2020-06-21 LAB — COMPREHENSIVE METABOLIC PANEL
ALT: 12 U/L (ref 0–44)
AST: 15 U/L (ref 15–41)
Albumin: 2.1 g/dL — ABNORMAL LOW (ref 3.5–5.0)
Alkaline Phosphatase: 77 U/L (ref 38–126)
Anion gap: 5 (ref 5–15)
BUN: 12 mg/dL (ref 8–23)
CO2: 18 mmol/L — ABNORMAL LOW (ref 22–32)
Calcium: 7.3 mg/dL — ABNORMAL LOW (ref 8.9–10.3)
Chloride: 113 mmol/L — ABNORMAL HIGH (ref 98–111)
Creatinine, Ser: 1.24 mg/dL — ABNORMAL HIGH (ref 0.44–1.00)
GFR calc Af Amer: 44 mL/min — ABNORMAL LOW (ref >60)
GFR calc non Af Amer: 38 mL/min — ABNORMAL LOW (ref >60)
Glucose, Bld: 84 mg/dL (ref 70–99)
Potassium: 3.8 mmol/L (ref 3.5–5.1)
Sodium: 136 mmol/L (ref 135–145)
Total Bilirubin: 0.7 mg/dL (ref 0.3–1.2)
Total Protein: 4.5 g/dL — ABNORMAL LOW (ref 6.5–8.1)

## 2020-06-21 LAB — CBC
HCT: 29.9 % — ABNORMAL LOW (ref 36.0–46.0)
Hemoglobin: 8.6 g/dL — ABNORMAL LOW (ref 12.0–15.0)
MCH: 25.3 pg — ABNORMAL LOW (ref 26.0–34.0)
MCHC: 28.8 g/dL — ABNORMAL LOW (ref 30.0–36.0)
MCV: 87.9 fL (ref 80.0–100.0)
Platelets: 179 10*3/uL (ref 150–400)
RBC: 3.4 MIL/uL — ABNORMAL LOW (ref 3.87–5.11)
RDW: 18.2 % — ABNORMAL HIGH (ref 11.5–15.5)
WBC: 6.3 10*3/uL (ref 4.0–10.5)
nRBC: 0 % (ref 0.0–0.2)

## 2020-06-21 LAB — SURGICAL PCR SCREEN
MRSA, PCR: NEGATIVE
Staphylococcus aureus: NEGATIVE

## 2020-06-21 LAB — MAGNESIUM: Magnesium: 1.6 mg/dL — ABNORMAL LOW (ref 1.7–2.4)

## 2020-06-21 MED ORDER — TORSEMIDE 20 MG PO TABS: 20.0000 mg | ORAL_TABLET | Freq: Every day | ORAL | Status: DC

## 2020-06-21 MED ORDER — FUROSEMIDE 10 MG/ML IJ SOLN
20.0000 mg | Freq: Once | INTRAMUSCULAR | Status: AC
  Administered 2020-06-21: 20 mg via INTRAVENOUS
  Filled 2020-06-21: qty 2

## 2020-06-21 MED ORDER — MAGNESIUM SULFATE 4 GM/100ML IV SOLN
4.0000 g | Freq: Once | INTRAVENOUS | Status: AC
  Administered 2020-06-21: 4 g via INTRAVENOUS
  Filled 2020-06-21: qty 100

## 2020-06-21 MED ORDER — TORSEMIDE 20 MG PO TABS
20.0000 mg | ORAL_TABLET | Freq: Every day | ORAL | Status: DC
  Filled 2020-06-21 (×2): qty 1

## 2020-06-21 NOTE — Anesthesia Preprocedure Evaluation (Addendum)
Anesthesia Evaluation  Patient identified by MRN, date of birth, ID band Patient awake    Reviewed: Allergy & Precautions, NPO status , Patient's Chart, lab work & pertinent test results, reviewed documented beta blocker date and time   Airway Mallampati: I  TM Distance: >3 FB Neck ROM: Full    Dental  (+) Edentulous Upper, Edentulous Lower   Pulmonary former smoker,  Pulmonary HTN, moderately reduced right ventricular function, severe TR  Quit smoking 1972   breath sounds clear to auscultation       Cardiovascular hypertension, Pt. on home beta blockers +CHF (LVEF 40%, moderately reduced R heart function)  + dysrhythmias (no A/C 2/2 hx GIB) Atrial Fibrillation and Supra Ventricular Tachycardia + pacemaker + Valvular Problems/Murmurs (mild MR, mild AI, severe TR) MR and AI  Rhythm:Regular Rate:Normal  Echo 03/2020: 1. Left ventricular ejection fraction, by estimation, is 40%. The left  ventricle has mildly decreased function. The left ventricle demonstrates  global hypokinesis. The left ventricular internal cavity size was mildly  dilated. Left ventricular diastolic  parameters are indeterminate.  2. Right ventricular systolic function is moderately reduced. The right  ventricular size is moderately enlarged. The estimated right ventricular  systolic pressure is 52.8 mmHg.  3. Left atrial size was severely dilated.  4. Right atrial size was severely dilated.  5. The mitral valve has been repaired/replaced. Mild mitral valve  regurgitation. No evidence of mitral stenosis however Doppler alignment of  diastolic gradient is slightly suboptimal. The mean mitral valve gradient  is 2.0 mmHg with average heart rate of  70 bpm.  6. Tricuspid valve regurgitation is severe.  7. The aortic valve is tricuspid. Aortic valve regurgitation is mild. No  aortic stenosis is present.  8. The inferior vena cava is dilated in size with <50%  respiratory  variability, suggesting right atrial pressure of 15 mmHg.    S/p mitral repair 2003, postop bradycardia now s/p PPM  Episode of NSVT 2008  Orthostatic hypotension, dysautonomia- on midodrine   Neuro/Psych negative neurological ROS  negative psych ROS   GI/Hepatic GERD  Medicated and Controlled,(+) Cirrhosis       , Fecal mass Hx GIB   Endo/Other  Obesity BMI 36  Renal/GU Renal InsufficiencyRenal diseaseCr 1.24  negative genitourinary   Musculoskeletal negative musculoskeletal ROS (+)   Abdominal Normal abdominal exam  (+)   Peds  Hematology  (+) Blood dyscrasia, anemia , hct 29.9   Anesthesia Other Findings   Reproductive/Obstetrics negative OB ROS                           Anesthesia Physical Anesthesia Plan  ASA: III  Anesthesia Plan: General   Post-op Pain Management:    Induction: Intravenous  PONV Risk Score and Plan: 4 or greater and Ondansetron, Dexamethasone and Treatment may vary due to age or medical condition  Airway Management Planned: Oral ETT  Additional Equipment: CVP and Ultrasound Guidance Line Placement  Intra-op Plan:   Post-operative Plan: Extubation in OR  Informed Consent: I have reviewed the patients History and Physical, chart, labs and discussed the procedure including the risks, benefits and alternatives for the proposed anesthesia with the patient or authorized representative who has indicated his/her understanding and acceptance.   Patient has DNR.  Discussed DNR with patient and Suspend DNR.   Dental advisory given  Plan Discussed with: CRNA  Anesthesia Plan Comments: (Underlying rhythm is Afib. )    Anesthesia Quick Evaluation

## 2020-06-21 NOTE — Progress Notes (Addendum)
PROGRESS NOTE    [name]  TIW:[id number] DOB: [birth year] DOA: 06/11/2020 PCP: [name], MD   No chief complaint on file.   Brief Narrative:  86 year old lady with prior history of tachybradycardia syndrome s/p biventricular pacemaker placement in 2007, severe MR s/p repair, chronic atrial fibrillation not on anticoagulation secondary to GI bleed, abnormal esophageal motility consistent with presbyesophagus, gastritis on last endoscopy with balloon dilatation by [name], pulmonary hypertension presents with 2 days of dysuria, and abdominal pain, black stools for several weeks, and increased confusion admitted for sepsis secondary to urinary tract infection in the setting of obstructive uropathy and acute on chronic anemia.  CT of the abdomen and pelvis shows left-sided moderate hydronephrosis with intrarenal calculi bilaterally, left ureteral calculi and soft tissue fullness in the cecal area.  Patient underwent ureteral stent placement by urology on 06/11/2020.  And was started on IV Rocephin and admitted to [facility] service for further evaluation and management. Urine culture showed lactobacillus transition IV Rocephin to oral amoxicillin for another 2 weeks to complete the course. [name] with Urology suggested he will post pone the stent removal to a later date. She underwent CT abd showing cecal mass concerning for malignancy. She underwent colonoscopy on 06/18/20 by [name] showing large cecal mass concerning for malignancy. Gen surgery consulted for further management. Cecal mass pathology shows adenocarcinoma. Cardiology consulted for pre op clearance as she might need surgical intervention.  Oncology consulted who recommended CT chest for further staging.  Patient for surgery tomorrow 06/21/2020.   Assessment & Plan:   Principal Problem:   Acute lower UTI Active Problems:   Liver cirrhosis (HCC)   CKD (chronic kidney disease) stage 3, GFR 30-59 ml/min   Anemia    Biventricular cardiac pacemaker in situ   Permanent atrial fibrillation (HCC)   Chronic diastolic CHF (congestive heart failure) (HCC)   Gout   S/P MVR (mitral valve repair)   AKI (acute kidney injury) ([place])   Orthostatic hypotension dysautonomic syndrome (HCC)   GI bleeding   Obstructive uropathy   Sepsis secondary to UTI ([place])   Hydronephrosis of left kidney   Left ureteral stone   Advanced care planning/counseling discussion   Goals of care, counseling/discussion   Palliative care by specialist   DNR (do not resuscitate)   Acute on chronic renal insufficiency   Iron deficiency anemia due to chronic blood loss  1 SIRS/Sepsis secondary to UTI Patient on admission met criteria for systemic inflammatory response with, MAP < 65, AKI, urinalysis worrisome for UTI.  Urine cultures positive for lactobacillus.  Patient noted to have left-sided moderate hydronephrosis secondary to left ureteral stone and underwent left ureteral stent placement 06/11/2020.  Patient was on IV Rocephin which has subsequently been discontinued.  Amoxicillin started yesterday 06/20/2020 to complete a 10 to 14-day course of antibiotic treatment as patient with stent placement and left urethral stone.  Patient currently afebrile.  Follow.   2.  Obstructive uropathy with left-sided moderate hydronephrosis secondary to left ureteral stone Noted on CT abdomen and pelvis.  Patient seen in consultation by urology and underwent left ureteral stent placement 06/11/2020.  Foley catheter discontinued.  Urine cultures positive for lactobacillus.  Patient on IV Rocephin which is subsequently been discontinued.  Amoxicillin started 06/20/2020 to complete a 10 to 14-day course of antibiotic treatment.  Patient with good urine output.  Clinical improvement.  Per urology reschedule uteroscopy for about 2 to 3 weeks out to give patient opportunity to recover from  pending colon resection.  Urology following and I appreciate the input and  recommendations.  3.  Cecal adenocarcinoma Soft tissue fullness noted in cecal area on CT abdomen and pelvis.  Repeat CT abdomen and pelvis showing a lobulated mass in the cecum concerning for malignancy.  GI consulted and patient underwent colonoscopy with biopsy on 06/18/2020 per Dr. Michail Sermon with pathology positive for adenocarcinoma.  Patient seen in consultation by general surgery to see whether she is a candidate for diverting colostomy.  Patient seen in consultation by cardiology for preop clearance.  Patient currently not obstructed at this time.  CT chest with interval development of 10 mm subpleural rounded density seen posteriorly in the right lung apex.  Follow-up unenhanced chest CT in 3 months recommended to ensure stability or resolution.  Patient for surgery today 06/21/2020.  Oncology consulted and are following and will evaluate postop to determine if further adjuvant treatment is feasible or warranted.  Palliative care also following.  Appreciate oncology, general surgery, palliative care input and recommendations.   4.  Chronic combined systolic diastolic heart failure Patient appears compensated.  2D echo from May 2021 with a EF of 40%.  Patient denies any significant shortness of breath.  Patient with some bibasilar crackles.  We will give Lasix 20 mg IV x1 preoperatively.  Resume home regimen Demadex postoperatively.  5.  Chronic hypotension Midodrine.   6.  Permanent atrial fibrillation Rate controlled.  Patient deemed not a anticoagulation candidate secondary to history of GI bleed.  Cardiology following.   7.  Tachybradycardia syndrome with BiV PPM Stable.  Last interrogation April 2021 patient noted to be V pacing.  Per cardiology.  8.  Acute on chronic kidney disease stage IIIb Likely post renal azotemia in the setting of obstructive uropathy in the setting of sepsis and relative hypotension on admission.  Creatinine noted to have peaked at 3 and trending down.  Creatinine  currently at 1.24.  Urine output not recorded.  Patient with some bibasilar crackles and as such we will give Lasix 20 mg IV x1 preoperatively.  Resume home regimen Demadex tomorrow.  Follow.  9.  Hypernatremia Likely secondary to free water deficit secondary to poor oral intake.  Diuretics on hold.  Improved.  Follow.  10.  Iron deficiency anemia secondary to colonic cancer bleed/anemia of chronic disease Status post recent EGD showing gastritis and presbyesophagus.  Baseline hemoglobin approximately 9.  On admission hemoglobin noted to be 7.9.  Status post 1 unit transfusion packed red blood cells hemoglobin currently at 8.6 and currently stable.  Monitor H&H postop.  Hematology/oncology recommending IV Feraheme 510 mg x 1 while in-house if okay with general surgery with resumption of iron sulfate 325 mg p.o. daily at time of discharge.  Transfusion threshold hemoglobin < 7.     DVT prophylaxis: SCDs.  History of GI bleed.  Patient for surgery today. Code Status: DNR Family Communication: Updated patient.  Updated daughter and son via telephone.   Disposition:   Status is: Inpatient    Dispo: The patient is from: Assisted living facility              Anticipated d/c is to: Assisted living facility              Anticipated d/c date is: To be determined.              Patient currently being worked up for cecal cancer and for surgery today 06/21/2020.  Oncology consultation following.  Not stable  for discharge.        Consultants:   General surgery: Dr.Toth III 06/19/2020  Cardiology: Dr. Radford Pax 06/19/2020  Gastroenterology: Dr. Watt Climes 06/11/2020  Urology: Dr. Jeffie Pollock 06/11/2020  Oncology Dr. Lorenso Courier 06/20/2020  Palliative care: Dr. Rowe Pavy 06/20/2020  Procedures:  CT chest 06/28/2020  CT abdomen and pelvis 06/15/2020, 06/11/2020  Chest x-ray 06/11/2020  Colonoscopy 06/18/2020--per Dr. Michail Sermon gastroenterology  Cystoscopy with left retrograde pyelogram and interpretation/cystoscopy with  insertion of left double-J stent per Dr.Wrenn 06/11/2020  Transfusion 1 unit packed red blood cells 06/11/2020  Antimicrobials:  IV Rocephin 06/11/2020>>>> 06/14/2020  Amoxicillin 06/20/2020   Subjective: Patient sitting up in bed.  Denies any chest pain.  No shortness of breath.  Stated passing gas.  Had bowel movement yesterday.  Awaiting surgery today.   Objective: Vitals:   06/20/20 0516 06/20/20 1243 06/20/20 2129 06/21/20 0535  BP: (!) 100/42 116/65 (!) 113/36 (!) 113/45  Pulse: 71 69 72 75  Resp: _0 Temp: 97.6 F (36.4 C) 98 F (36.7 C) 98.1 F (36.7 C) 98.2 F (36.8 C)  TempSrc: Oral Oral Oral Oral  SpO2: 99% 99% 98% 98%  Weight:      Height:        Intake/Output Summary (Last 24 hours) at 06/21/2020 0938 Last data filed at 06/20/2020 2231 Gross per 24 hour  Intake 360 ml  Output --  Net 360 ml   Filed Weights   06/18/20 0551 06/19/20 0600 06/20/20 0500  Weight: 89.1 kg 94.5 kg 90 kg    Examination:  General exam: NAD Respiratory system: Bibasilar crackles.  No wheezing.  No use of accessory muscles of respiration.  Fair air movement.  Speaking in full sentences.  Cardiovascular system: Regular rate and rhythm no murmurs rubs or gallops.  No JVD.  Trace bilateral lower extremity edema.   Gastrointestinal system: Abdomen is obese, soft, nondistended, some diffuse tenderness to palpation/discomfort per patient.  Positive bowel sounds.  No rebound.  No guarding.  Central nervous system: Alert and oriented.  No focal neurological deficits.  Moving extremities spontaneously.   Extremities: Symmetric 5 x 5 power. Skin: No rashes, lesions or ulcers Psychiatry: Judgement and insight appear normal. Mood & affect appropriate.     Data Reviewed: I have personally reviewed following labs and imaging studies  CBC: Recent Labs  Lab 06/15/20 0547 06/17/20 0938 06/20/20 0604 06/21/20 0518  WBC 6.8 6.0 5.3 6.3  HGB 8.6* 9.2* 8.7* 8.6*  HCT 29.8* 32.1* 30.8*  29.9*  MCV 87.9 87.7 88.8 87.9  PLT 209 220 185 528    Basic Metabolic Panel: Recent Labs  Lab 06/15/20 0547 06/16/20 0602 06/17/20 0938 06/20/20 0604 06/21/20 0518  NA 147* 145 143 141 136  K 4.0 3.7 3.9 3.9 3.8  CL 117* 117* 115* 114* 113*  CO2 20* 21* 21* 21* 18*  GLUCOSE 96 84 110* 86 84  BUN 28* _1 CREATININE 1.58* 1.37* 1.42* 1.27* 1.24*  CALCIUM 7.9* 7.8* 7.7* 7.7* 7.3*  MG  --   --   --   --  1.6*    GFR: Estimated Creatinine Clearance: 30.8 mL/min (A) (by C-G formula based on SCr of 1.24 mg/dL (H)).  Liver Function Tests: Recent Labs  Lab 06/15/20 0547 06/20/20 0604 06/21/20 0518  AST _2 ALT _3 ALKPHOS 95 87 77  BILITOT 0.6 0.7 0.7  PROT 5.3* 4.9* 4.5*  ALBUMIN 2.2* 2.2* 2.1*  CBG: No results for input(s): GLUCAP in the last 168 hours.   Recent Results (from the past 240 hour(s))  Urine Culture     Status: Abnormal   Collection Time: 06/11/20  7:22 PM   Specimen: Urine, Catheterized  Result Value Ref Range Status   Specimen Description   Final    URINE, CATHETERIZED Performed at [facility] [address] [place], [place] [address]    Special Requests   Final    NONE Performed at [facility], [place] [address] [place], [place] [address]    Culture (A)  Final    70,000 COLONIES/mL LACTOBACILLUS SPECIES Standardized susceptibility testing for this organism is not available. Performed at [facility], [place] [address] [place], [place] [address]    Report Status 06/13/2020 FINAL  Final  Surgical pcr screen     Status: None   Collection Time: 06/20/20 11:00 PM   Specimen: Nasal Mucosa; Nasal Swab  Result Value Ref Range Status   MRSA, PCR NEGATIVE NEGATIVE Final   Staphylococcus aureus NEGATIVE NEGATIVE Final    Comment: (NOTE) The Xpert SA Assay (FDA approved for NASAL specimens in patients 74 years of age and older), is one component of a comprehensive surveillance  program. It is not intended to diagnose infection nor to guide or monitor treatment. Performed at [facility], [place] [address], [place], [place] [address]          Radiology Studies: CT CHEST WO CONTRAST  Result Date: 06/20/2020 CLINICAL DATA:  Colorectal cancer. EXAM: CT CHEST WITHOUT CONTRAST TECHNIQUE: Multidetector CT imaging of the chest was performed following the standard protocol without IV contrast. COMPARISON:  June 15, 2020. FINDINGS: Cardiovascular: Atherosclerosis of thoracic aorta is noted without aneurysm formation. Mild cardiomegaly is noted. No pericardial effusion is noted. Left-sided pacemaker is noted. Status post aortic mitral valve repair. Mediastinum/Nodes: No enlarged mediastinal or axillary lymph nodes. Thyroid gland, trachea, and esophagus demonstrate no significant findings. Lungs/Pleura: No pneumothorax is noted. Small bilateral pleural effusions are noted with adjacent subsegmental atelectasis. Interval development of 10 mm subpleural rounded density seen posteriorly in the right lung apex best seen on image number 28 of series 5. This may simply represent scarring or focal atelectasis, but neoplasm cannot be excluded. Upper Abdomen: Nodular hepatic contours are noted suggesting possible hepatic cirrhosis. Musculoskeletal: No chest wall mass or suspicious bone lesions identified. IMPRESSION: 1. Interval development of 10 mm subpleural rounded density seen posteriorly in the right lung apex. This may simply represent scarring or focal atelectasis, but neoplasm or metastatic lesion cannot be excluded. Follow-up unenhanced chest CT in 3 months is recommended to ensure stability or resolution. 2. Small bilateral pleural effusions are noted with adjacent subsegmental atelectasis. 3. Nodular hepatic contours are noted suggesting possible hepatic cirrhosis. Aortic Atherosclerosis (ICD10-I70.0). Electronically Signed   By: Marijo Conception M.D.   On: 06/20/2020  13:29        Scheduled Meds: . (feeding supplement) PROSource Plus  30 mL Oral BID BM  . acetaminophen  1,000 mg Oral On Call to OR  . alvimopan  12 mg Oral On Call to OR  . amoxicillin  500 mg Oral Q8H  . feeding supplement  1 Container Oral TID BM  . furosemide  20 mg Intravenous Once  . gabapentin  300 mg Oral On Call to OR  . midodrine  5 mg Oral TID WC  . pantoprazole  40 mg Oral BID  . senna-docusate  2 tablet Oral BID  . sodium chloride flush  3 mL Intravenous Q12H   Continuous Infusions: . cefoTEtan (CEFOTAN) IV    . lactated ringers    . magnesium sulfate bolus IVPB       LOS: 10 days    Time spent: 35 minutes    Irine Seal, MD [facility]   To contact the attending provider between 7A-7P or the covering provider during after hours 7P-7A, please log into the web site [email or web address] and access using universal [place] password for that web site. If you do not have the password, please call the hospital operator.  06/21/2020, 9:38 AM

## 2020-06-21 NOTE — Progress Notes (Signed)
Pt's Advance Directive notarized.  Original and copies with pt.  Copy placed in pt chart on unit.

## 2020-06-21 NOTE — H&P (View-Only) (Signed)
3 Days Post-Op    CC:  Subjective: Feels the same. States that she has some mild right sided abdominal soreness, but no pain. Denies n/v. Tolerating clears. BM yesterday.  Objective: Vital signs in last 24 hours: Temp:  [98 F (36.7 C)-98.2 F (36.8 C)] 98.2 F (36.8 C) (08/05 0535) Pulse Rate:  [69-75] 75 (08/05 0535) Resp:  [16-17] 16 (08/05 0535) BP: (113-116)/(36-65) 113/45 (08/05 0535) SpO2:  [98 %-99 %] 98 % (08/05 0535) Last BM Date: 06/20/20  Intake/Output from previous day: 08/04 0701 - 08/05 0700 In: 360 [P.O.:360] Out: -  Intake/Output this shift: No intake/output data recorded.  PE: Gen: Alert, NAD, pleasant HEENT: EOM's intact, pupils equal and round Card: RRR Pulm: CTAB, no W/R/R, rate and effort normal Ext: calves soft and nontender Abd: Soft,ND, NT, +BS, no HSM, no hernia Skin: no rashes noted, warm and dry    Lab Results:  Recent Labs    06/20/20 0604 06/21/20 0518  WBC 5.3 6.3  HGB 8.7* 8.6*  HCT 30.8* 29.9*  PLT 185 179    BMET Recent Labs    06/20/20 0604 06/21/20 0518  NA 141 136  K 3.9 3.8  CL 114* 113*  CO2 21* 18*  GLUCOSE 86 84  BUN 13 12  CREATININE 1.27* 1.24*  CALCIUM 7.7* 7.3*   PT/INR Recent Labs    06/19/20 0537 06/20/20 0604  LABPROT 14.6 14.6  INR 1.2 1.2    Recent Labs  Lab 06/15/20 0547 06/20/20 0604 06/21/20 0518  AST 16 16 15   ALT 13 12 12   ALKPHOS 95 87 77  BILITOT 0.6 0.7 0.7  PROT 5.3* 4.9* 4.5*  ALBUMIN 2.2* 2.2* 2.1*     Lipase     Component Value Date/Time   LIPASE 27 06/11/2020 0123     Medications: . (feeding supplement) PROSource Plus  30 mL Oral BID BM  . acetaminophen  1,000 mg Oral On Call to OR  . alvimopan  12 mg Oral On Call to OR  . amoxicillin  500 mg Oral Q8H  . feeding supplement  1 Container Oral TID BM  . gabapentin  300 mg Oral On Call to OR  . midodrine  5 mg Oral TID WC  . pantoprazole  40 mg Oral BID  . senna-docusate  2 tablet Oral BID  . sodium  chloride flush  3 mL Intravenous Q12H  . [START ON 06/22/2020] torsemide  20 mg Oral Daily   Anti-infectives (From admission, onward)   Start     Dose/Rate Route Frequency Ordered Stop   06/21/20 1115  cefoTEtan (CEFOTAN) 2 g in sodium chloride 0.9 % 100 mL IVPB     Discontinue     2 g 200 mL/hr over 30 Minutes Intravenous On call to O.R. 06/20/20 1346 06/22/20 0559   06/20/20 2200  amoxicillin (AMOXIL) capsule 500 mg     Discontinue     500 mg Oral Every 8 hours 06/20/20 1950     06/14/20 1000  amoxicillin (AMOXIL) capsule 500 mg        500 mg Oral Every 12 hours 06/14/20 0741 06/19/20 0959   06/11/20 1030  cefTRIAXone (ROCEPHIN) 1 g in sodium chloride 0.9 % 100 mL IVPB  Status:  Discontinued        1 g 200 mL/hr over 30 Minutes Intravenous Every 24 hours 06/11/20 1021 06/14/20 0741   06/11/20 0300  cefTRIAXone (ROCEPHIN) 1 g in sodium chloride 0.9 % 100 mL IVPB  1 g 200 mL/hr over 30 Minutes Intravenous  Once 06/11/20 0259 06/11/20 0458       Assessment/Plan SIRS secondary to UTI/GI bleed Tachy-Brady syndrome - PTVP 11/2014 Severe MR s/p repair with 28 mm annuloplasty ring/oversew LAA, 2003 Hx NSVT/chronic atrial fibrillation-not on anticoagulation secondary to GI bleed Hx CHF Hx pulmonary hypertension Hx anemia Hx CKD with AKI Hx gastritis Hx recent balloon dilatation [name] Hx spinal stenosis Hx hypertension/hypertension-on midodrine at home Hydronephrosis with cystoscopy/left retrograde pyelogram/insertion of left double-J stent 06/11/2020;Dr. Jeffie Pollock Hx remote tobacco use Malnutrition - prealbumin 11 (8/3)  Cecal Mass - adenocarcinoma - colonoscopy 06/18/20, Dr. Michail Sermon  >> path: adenocarcinoma - CEA 575 (8/3)  FEN: IV fluids/clear liquids, Boost breeze, NPO after midnight ID: Rocephin 7/26-7/28; amoxicillin 7/29 >> day#7 DVT: SCD's, ok for chemical DVT prophylaxis from surgical standpoint Follow-up: TBD  Plan:Unfortunately due to timing in  OR we are going to have to delay surgery until tomorrow. Continue clear liquids today, NPO after midnight. Plan for laparoscopic assisted right colectomy in OR tomorrow.   LOS: 10 days    [facility] 06/21/2020, 10:52 AM Please see Amion for pager number during day hours 7:00am-4:30pm

## 2020-06-21 NOTE — Progress Notes (Signed)
3 Days Post-Op    CC:  Subjective: Feels the same. States that she has some mild right sided abdominal soreness, but no pain. Denies n/v. Tolerating clears. BM yesterday.  Objective: Vital signs in last 24 hours: Temp:  [98 F (36.7 C)-98.2 F (36.8 C)] 98.2 F (36.8 C) (08/05 0535) Pulse Rate:  [69-75] 75 (08/05 0535) Resp:  [16-17] 16 (08/05 0535) BP: (113-116)/(36-65) 113/45 (08/05 0535) SpO2:  [98 %-99 %] 98 % (08/05 0535) Last BM Date: 06/20/20  Intake/Output from previous day: 08/04 0701 - 08/05 0700 In: 360 [P.O.:360] Out: -  Intake/Output this shift: No intake/output data recorded.  PE: Gen: Alert, NAD, pleasant HEENT: EOM's intact, pupils equal and round Card: RRR Pulm: CTAB, no W/R/R, rate and effort normal Ext: calves soft and nontender Abd: Soft,ND, NT, +BS, no HSM, no hernia Skin: no rashes noted, warm and dry    Lab Results:  Recent Labs    06/20/20 0604 06/21/20 0518  WBC 5.3 6.3  HGB 8.7* 8.6*  HCT 30.8* 29.9*  PLT 185 179    BMET Recent Labs    06/20/20 0604 06/21/20 0518  NA 141 136  K 3.9 3.8  CL 114* 113*  CO2 21* 18*  GLUCOSE 86 84  BUN 13 12  CREATININE 1.27* 1.24*  CALCIUM 7.7* 7.3*   PT/INR Recent Labs    06/19/20 0537 06/20/20 0604  LABPROT 14.6 14.6  INR 1.2 1.2    Recent Labs  Lab 06/15/20 0547 06/20/20 0604 06/21/20 0518  AST 16 16 15   ALT 13 12 12   ALKPHOS 95 87 77  BILITOT 0.6 0.7 0.7  PROT 5.3* 4.9* 4.5*  ALBUMIN 2.2* 2.2* 2.1*     Lipase     Component Value Date/Time   LIPASE 27 06/11/2020 0123     Medications: . (feeding supplement) PROSource Plus  30 mL Oral BID BM  . acetaminophen  1,000 mg Oral On Call to OR  . alvimopan  12 mg Oral On Call to OR  . amoxicillin  500 mg Oral Q8H  . feeding supplement  1 Container Oral TID BM  . gabapentin  300 mg Oral On Call to OR  . midodrine  5 mg Oral TID WC  . pantoprazole  40 mg Oral BID  . senna-docusate  2 tablet Oral BID  . sodium  chloride flush  3 mL Intravenous Q12H  . [START ON 06/22/2020] torsemide  20 mg Oral Daily   Anti-infectives (From admission, onward)   Start     Dose/Rate Route Frequency Ordered Stop   06/21/20 1115  cefoTEtan (CEFOTAN) 2 g in sodium chloride 0.9 % 100 mL IVPB     Discontinue     2 g 200 mL/hr over 30 Minutes Intravenous On call to O.R. 06/20/20 1346 06/22/20 0559   06/20/20 2200  amoxicillin (AMOXIL) capsule 500 mg     Discontinue     500 mg Oral Every 8 hours 06/20/20 1950     06/14/20 1000  amoxicillin (AMOXIL) capsule 500 mg        500 mg Oral Every 12 hours 06/14/20 0741 06/19/20 0959   06/11/20 1030  cefTRIAXone (ROCEPHIN) 1 g in sodium chloride 0.9 % 100 mL IVPB  Status:  Discontinued        1 g 200 mL/hr over 30 Minutes Intravenous Every 24 hours 06/11/20 1021 06/14/20 0741   06/11/20 0300  cefTRIAXone (ROCEPHIN) 1 g in sodium chloride 0.9 % 100 mL IVPB  1 g 200 mL/hr over 30 Minutes Intravenous  Once 06/11/20 0259 06/11/20 0458       Assessment/Plan SIRS secondary to UTI/GI bleed Tachy-Brady syndrome - PTVP 11/2014 Severe MR s/p repair with 28 mm annuloplasty ring/oversew LAA, 2003 Hx NSVT/chronic atrial fibrillation-not on anticoagulation secondary to GI bleed Hx CHF Hx pulmonary hypertension Hx anemia Hx CKD with AKI Hx gastritis Hx recent balloon dilatation [name] Hx spinal stenosis Hx hypertension/hypertension-on midodrine at home Hydronephrosis with cystoscopy/left retrograde pyelogram/insertion of left double-J stent 06/11/2020;Dr. Jeffie Pollock Hx remote tobacco use Malnutrition - prealbumin 11 (8/3)  Cecal Mass - adenocarcinoma - colonoscopy 06/18/20, Dr. Michail Sermon  >> path: adenocarcinoma - CEA 575 (8/3)  FEN: IV fluids/clear liquids, Boost breeze, NPO after midnight ID: Rocephin 7/26-7/28; amoxicillin 7/29 >> day#7 DVT: SCD's, ok for chemical DVT prophylaxis from surgical standpoint Follow-up: TBD  Plan:Unfortunately due to timing in  OR we are going to have to delay surgery until tomorrow. Continue clear liquids today, NPO after midnight. Plan for laparoscopic assisted right colectomy in OR tomorrow.   LOS: 10 days    [facility] 06/21/2020, 10:52 AM Please see Amion for pager number during day hours 7:00am-4:30pm

## 2020-06-21 NOTE — Progress Notes (Signed)
Daily Progress Note   Patient Name: [name]       Date: 06/21/2020 DOB: December 22, 1925  Age: 86 y.o. MRN#: [id number] Attending Physician: Eugenie Filler, MD Primary Care Physician: [name], MD Admit Date: 06/11/2020  Reason for Consultation/Follow-up: Establishing goals of care  Subjective: No significant overnight events. Visited with [name] and her son/daughter this morning. Pt denies pain at this time. Discussed plans for OR today. We revisited the MOST form as well as advanced directive.  Surgery delayed until 8/6.   Length of Stay: 10  Current Medications: Scheduled Meds:  . (feeding supplement) PROSource Plus  30 mL Oral BID BM  . acetaminophen  1,000 mg Oral On Call to OR  . alvimopan  12 mg Oral On Call to OR  . amoxicillin  500 mg Oral Q8H  . feeding supplement  1 Container Oral TID BM  . gabapentin  300 mg Oral On Call to OR  . midodrine  5 mg Oral TID WC  . pantoprazole  40 mg Oral BID  . senna-docusate  2 tablet Oral BID  . sodium chloride flush  3 mL Intravenous Q12H  . [START ON 06/22/2020] torsemide  20 mg Oral Daily    Continuous Infusions: . cefoTEtan (CEFOTAN) IV Stopped (06/21/20 1238)  . lactated ringers      PRN Meds: acetaminophen **OR** acetaminophen, acetaminophen, HYDROmorphone (DILAUDID) injection, metoprolol tartrate, ondansetron (ZOFRAN) IV, oxyCODONE, promethazine      Vital Signs: BP (!) 113/45 (BP Location: Left Arm)   Pulse 75   Temp 98.2 F (36.8 C) (Oral)   Resp 16   Ht 5\' 2"  (1.575 m)   Wt 90 kg   LMP 10/10/2013   SpO2 98%   BMI 36.29 kg/m  SpO2: SpO2: 98 % O2 Device: O2 Device: Room Air O2 Flow Rate: RA  General: in no acute distress Cardiac: extremities warm Pulm: breathing comfortably on room air Neuro:  alert. No focal deficit. Remembers our discussion from yesterday. Skin: no rash on limited exam.  Intake/output summary:   Intake/Output Summary (Last 24 hours) at 06/21/2020 1310 Last data filed at 06/21/2020 1304 Gross per 24 hour  Intake 240 ml  Output 200 ml  Net 40 ml   LBM: Last BM Date: 06/20/20 Baseline Weight: Weight: 88 kg Most recent weight: Weight:  90 kg       Palliative Assessment/Data: 40%      Patient Active Problem List   Diagnosis Date Noted  . Iron deficiency anemia due to chronic blood loss 06/21/2020  . Advanced care planning/counseling discussion   . Goals of care, counseling/discussion   . Palliative care by specialist   . DNR (do not resuscitate)   . Acute on chronic renal insufficiency   . Sepsis secondary to UTI ([place]) 06/12/2020  . Hydronephrosis of left kidney 06/12/2020  . Left ureteral stone 06/12/2020  . GI bleeding 06/11/2020  . Obstructive uropathy 06/11/2020  . Syncope and collapse 04/04/2020  . Influenza A   . Bronchiectasis with acute exacerbation ([place])   . Respiratory distress 10/06/2017  . Orthostatic hypotension dysautonomic syndrome ([employer])   . AKI (acute kidney injury) ([place]) 05/21/2016  . Cellulitis of leg, right 08/18/2015  . NSVT (nonsustained ventricular tachycardia) ([place])   . Gout flare 08/16/2015  . Abnormal thyroid function test 08/15/2015  . Right ankle pain 08/15/2015  . History of gout 08/15/2015  . Ankle pain   . S/P MVR (mitral valve repair) 01/05/2014  . Acute posthemorrhagic anemia 12/29/2013  . Encounter for therapeutic drug monitoring 12/12/2013  . Acute lower UTI 11/27/2013  . Gout 11/07/2013  . Orthostatic hypotension 10/31/2013  . Chronic diastolic CHF (congestive heart failure) ([place])   . Hypertension   . Mitral valve disorder   . Permanent atrial fibrillation ([place]) 10/28/2013  . Biventricular cardiac pacemaker in situ 10/14/2013  . Perirectal abscess 10/13/2013  . Chronic anticoagulation 10/10/2013  .  Liver cirrhosis ([place]) 10/10/2013  . Thrombocytopenia ([place]) 10/10/2013  . Nausea and vomiting 10/10/2013  . CKD (chronic kidney disease) stage 3, GFR 30-59 ml/min 10/10/2013  . Anemia 10/10/2013    Palliative Care Assessment & Plan   Patient Profile: 45 yof admitted 06/11/20 for obstructive uropathy s/p stent placement by urology. Abdominal CT on admission revealed a large partially obstructive mass in the cecum. Colonoscopy redemonstrated this fungating mass and biopsy was consistent with adenocarcinoma. General surgery was consulted and is planning for laproscopic right colectomy. Chest imaging has also revealed a subpleural nodule. Oncology has been consulted and will be following. Palliative care was consulted for discussion on goals of care.  Assessment/Recommendations/Plan   Post op pain management per surgery.   Will continue to follow for palliative needs. Suspect she will need SNF placement at discharge. TOC is already on board to assist with this.  Goals of Care and Additional Recommendations:  Limitations on Scope of Treatment: Full Scope Treatment  Code Status:  Full  Prognosis:   Unable to determine  Discharge Planning:  [facility] for rehab with Palliative care service follow-up  Care plan was discussed with patient and her daughter, Santiago Glad.  Thank you for allowing the Palliative Medicine Team to assist in the care of this patient.  Mitzi Hansen, MD Internal Medicine Resident PGY-2 [facility] Pager: [phone] 06/21/2020 1:12 PM      Time In: 1230 Time Out: 1305 Total Time 35 minutes Prolonged Time Billed No      Greater than 50%  of this time was spent counseling and coordinating care related to the above assessment and plan.   Mitzi Hansen, MD Internal Medicine Resident PGY-2 [facility] Pager: [phone] 06/21/2020 1:12 PM   Mariana Kaufman, AGNP-C Palliative  Medicine   Please contact Palliative Medicine Team phone at [phone] for questions and concerns.

## 2020-06-21 NOTE — Progress Notes (Signed)
Spiritual care following d/t consult re: advance directives.    Pt presents as alert and oriented.  Voiced affirmation of desire to complete advance directive paperwork.  Patient confirmed information in AD paperwork and clarified question regarding living will via phone call with daughter, Santiago Glad.  Chaplain contacted notary, who will contact with ability to notarize this afternoon.  If not able to secure notary this afternoon, we will notarize tomorrow, AM.

## 2020-06-22 ENCOUNTER — Inpatient Hospital Stay (HOSPITAL_COMMUNITY): Payer: Medicare Other

## 2020-06-22 ENCOUNTER — Inpatient Hospital Stay (HOSPITAL_COMMUNITY): Payer: Medicare Other | Admitting: Anesthesiology

## 2020-06-22 ENCOUNTER — Encounter (HOSPITAL_COMMUNITY)
Admission: EM | Disposition: A | Discharge status: Skilled Nursing Facility | Payer: Self-pay | Source: Home / Self Care | Attending: Internal Medicine

## 2020-06-22 ENCOUNTER — Encounter (HOSPITAL_COMMUNITY): Payer: Self-pay | Admitting: Internal Medicine

## 2020-06-22 DIAGNOSIS — N179 Acute kidney failure, unspecified: Secondary | ICD-10-CM | POA: Diagnosis not present

## 2020-06-22 DIAGNOSIS — N201 Calculus of ureter: Secondary | ICD-10-CM | POA: Diagnosis not present

## 2020-06-22 DIAGNOSIS — N289 Disorder of kidney and ureter, unspecified: Secondary | ICD-10-CM | POA: Diagnosis not present

## 2020-06-22 DIAGNOSIS — N390 Urinary tract infection, site not specified: Secondary | ICD-10-CM | POA: Diagnosis not present

## 2020-06-22 HISTORY — PX: LAPAROSCOPIC RIGHT COLECTOMY: SHX5925

## 2020-06-22 LAB — BASIC METABOLIC PANEL
Anion gap: 7 (ref 5–15)
BUN: 12 mg/dL (ref 8–23)
CO2: 18 mmol/L — ABNORMAL LOW (ref 22–32)
Calcium: 7.3 mg/dL — ABNORMAL LOW (ref 8.9–10.3)
Chloride: 114 mmol/L — ABNORMAL HIGH (ref 98–111)
Creatinine, Ser: 1.15 mg/dL — ABNORMAL HIGH (ref 0.44–1.00)
GFR calc Af Amer: 48 mL/min — ABNORMAL LOW (ref >60)
GFR calc non Af Amer: 42 mL/min — ABNORMAL LOW (ref >60)
Glucose, Bld: 86 mg/dL (ref 70–99)
Potassium: 4 mmol/L (ref 3.5–5.1)
Sodium: 139 mmol/L (ref 135–145)

## 2020-06-22 LAB — TYPE AND SCREEN
ABO/RH(D): A POS
Antibody Screen: NEGATIVE

## 2020-06-22 LAB — MAGNESIUM: Magnesium: 2.5 mg/dL — ABNORMAL HIGH (ref 1.7–2.4)

## 2020-06-22 LAB — CBC
HCT: 28.2 % — ABNORMAL LOW (ref 36.0–46.0)
Hemoglobin: 8.3 g/dL — ABNORMAL LOW (ref 12.0–15.0)
MCH: 25.6 pg — ABNORMAL LOW (ref 26.0–34.0)
MCHC: 29.4 g/dL — ABNORMAL LOW (ref 30.0–36.0)
MCV: 87 fL (ref 80.0–100.0)
Platelets: 158 10*3/uL (ref 150–400)
RBC: 3.24 MIL/uL — ABNORMAL LOW (ref 3.87–5.11)
RDW: 18 % — ABNORMAL HIGH (ref 11.5–15.5)
WBC: 5 10*3/uL (ref 4.0–10.5)
nRBC: 0 % (ref 0.0–0.2)

## 2020-06-22 SURGERY — COLECTOMY, RIGHT, LAPAROSCOPIC
Anesthesia: General | Site: Abdomen | Laterality: Right

## 2020-06-22 MED ORDER — SODIUM CHLORIDE 0.9% FLUSH
10.0000 mL | INTRAVENOUS | Status: DC | PRN
  Administered 2020-06-25 – 2020-07-11 (×3): 10 mL

## 2020-06-22 MED ORDER — SODIUM CHLORIDE 0.9 % IV SOLN
INTRAVENOUS | Status: AC
  Filled 2020-06-22: qty 2

## 2020-06-22 MED ORDER — PHENYLEPHRINE HCL-NACL 10-0.9 MG/250ML-% IV SOLN
INTRAVENOUS | Status: DC | PRN
  Administered 2020-06-22: 25 ug/min via INTRAVENOUS

## 2020-06-22 MED ORDER — ONDANSETRON HCL 4 MG/2ML IJ SOLN: 4.0000 mg | Freq: Once | INTRAMUSCULAR | Status: DC | PRN

## 2020-06-22 MED ORDER — ACETAMINOPHEN 160 MG/5ML PO SOLN: 325.0000 mg | Freq: Once | ORAL | Status: DC | PRN

## 2020-06-22 MED ORDER — ALVIMOPAN 12 MG PO CAPS
ORAL_CAPSULE | ORAL | Status: AC
  Filled 2020-06-22: qty 1

## 2020-06-22 MED ORDER — AMISULPRIDE (ANTIEMETIC) 5 MG/2ML IV SOLN: 10.0000 mg | Freq: Once | INTRAVENOUS | Status: DC | PRN

## 2020-06-22 MED ORDER — ROCURONIUM BROMIDE 10 MG/ML (PF) SYRINGE
PREFILLED_SYRINGE | INTRAVENOUS | Status: AC
  Filled 2020-06-22: qty 10

## 2020-06-22 MED ORDER — ROCURONIUM BROMIDE 10 MG/ML (PF) SYRINGE
PREFILLED_SYRINGE | INTRAVENOUS | Status: DC | PRN
  Administered 2020-06-22: 10 mg via INTRAVENOUS
  Administered 2020-06-22: 60 mg via INTRAVENOUS
  Administered 2020-06-22: 10 mg via INTRAVENOUS

## 2020-06-22 MED ORDER — 0.9 % SODIUM CHLORIDE (POUR BTL) OPTIME
TOPICAL | Status: DC | PRN
  Administered 2020-06-22: 2000 mL

## 2020-06-22 MED ORDER — ACETAMINOPHEN 325 MG PO TABS: 325.0000 mg | ORAL_TABLET | Freq: Once | ORAL | Status: DC | PRN

## 2020-06-22 MED ORDER — ACETAMINOPHEN 10 MG/ML IV SOLN: 1000.0000 mg | Freq: Once | INTRAVENOUS | Status: DC | PRN

## 2020-06-22 MED ORDER — SUGAMMADEX SODIUM 200 MG/2ML IV SOLN
INTRAVENOUS | Status: DC | PRN
  Administered 2020-06-22: 200 mg via INTRAVENOUS

## 2020-06-22 MED ORDER — ACETAMINOPHEN 500 MG PO TABS
1000.0000 mg | ORAL_TABLET | ORAL | Status: AC
  Administered 2020-06-22: 1000 mg via ORAL

## 2020-06-22 MED ORDER — LACTATED RINGERS IR SOLN
Status: DC | PRN
  Administered 2020-06-22: 1000 mL

## 2020-06-22 MED ORDER — ONDANSETRON HCL 4 MG/2ML IJ SOLN
INTRAMUSCULAR | Status: DC | PRN
  Administered 2020-06-22: 4 mg via INTRAVENOUS

## 2020-06-22 MED ORDER — PHENYLEPHRINE HCL-NACL 10-0.9 MG/250ML-% IV SOLN
INTRAVENOUS | Status: AC
  Filled 2020-06-22: qty 250

## 2020-06-22 MED ORDER — FENTANYL CITRATE (PF) 250 MCG/5ML IJ SOLN
INTRAMUSCULAR | Status: AC
  Filled 2020-06-22: qty 5

## 2020-06-22 MED ORDER — SODIUM BICARBONATE 650 MG PO TABS
650.0000 mg | ORAL_TABLET | Freq: Two times a day (BID) | ORAL | Status: DC
  Administered 2020-06-22 – 2020-07-14 (×44): 650 mg via ORAL
  Filled 2020-06-22 (×44): qty 1

## 2020-06-22 MED ORDER — PROPOFOL 10 MG/ML IV BOLUS
INTRAVENOUS | Status: DC | PRN
  Administered 2020-06-22: 80 mg via INTRAVENOUS

## 2020-06-22 MED ORDER — ALVIMOPAN 12 MG PO CAPS
12.0000 mg | ORAL_CAPSULE | ORAL | Status: AC
  Administered 2020-06-22: 12 mg via ORAL

## 2020-06-22 MED ORDER — CHLORHEXIDINE GLUCONATE CLOTH 2 % EX PADS
6.0000 | MEDICATED_PAD | Freq: Every day | CUTANEOUS | Status: DC
  Administered 2020-06-22 – 2020-07-10 (×18): 6 via TOPICAL

## 2020-06-22 MED ORDER — FENTANYL CITRATE (PF) 100 MCG/2ML IJ SOLN
INTRAMUSCULAR | Status: DC | PRN
  Administered 2020-06-22: 100 ug via INTRAVENOUS
  Administered 2020-06-22 (×2): 25 ug via INTRAVENOUS

## 2020-06-22 MED ORDER — BUPIVACAINE-EPINEPHRINE 0.25% -1:200000 IJ SOLN
INTRAMUSCULAR | Status: DC | PRN
  Administered 2020-06-22: 20 mL

## 2020-06-22 MED ORDER — OXYCODONE HCL 5 MG/5ML PO SOLN: 5.0000 mg | Freq: Once | ORAL | Status: DC | PRN

## 2020-06-22 MED ORDER — PROPOFOL 10 MG/ML IV BOLUS
INTRAVENOUS | Status: AC
  Filled 2020-06-22: qty 20

## 2020-06-22 MED ORDER — GABAPENTIN 300 MG PO CAPS
ORAL_CAPSULE | ORAL | Status: AC
  Filled 2020-06-22: qty 1

## 2020-06-22 MED ORDER — SODIUM CHLORIDE 0.9 % IV SOLN
INTRAVENOUS | Status: DC | PRN
  Administered 2020-06-22: 2 g via INTRAVENOUS

## 2020-06-22 MED ORDER — PHENYLEPHRINE HCL (PRESSORS) 10 MG/ML IV SOLN
INTRAVENOUS | Status: AC
  Filled 2020-06-22: qty 1

## 2020-06-22 MED ORDER — DEXAMETHASONE SODIUM PHOSPHATE 10 MG/ML IJ SOLN
INTRAMUSCULAR | Status: AC
  Filled 2020-06-22: qty 1

## 2020-06-22 MED ORDER — LACTATED RINGERS IV SOLN
INTRAVENOUS | Status: DC
  Administered 2020-06-22: 1000 mL via INTRAVENOUS

## 2020-06-22 MED ORDER — SODIUM CHLORIDE (PF) 0.9 % IJ SOLN
INTRAMUSCULAR | Status: AC
  Filled 2020-06-22: qty 10

## 2020-06-22 MED ORDER — FENTANYL CITRATE (PF) 100 MCG/2ML IJ SOLN
25.0000 ug | INTRAMUSCULAR | Status: DC | PRN
  Administered 2020-06-22 (×3): 25 ug via INTRAVENOUS

## 2020-06-22 MED ORDER — LIDOCAINE 2% (20 MG/ML) 5 ML SYRINGE
INTRAMUSCULAR | Status: DC | PRN
  Administered 2020-06-22: 60 mg via INTRAVENOUS

## 2020-06-22 MED ORDER — BUPIVACAINE-EPINEPHRINE 0.25% -1:200000 IJ SOLN
INTRAMUSCULAR | Status: AC
  Filled 2020-06-22: qty 1

## 2020-06-22 MED ORDER — OXYCODONE HCL 5 MG PO TABS: 5.0000 mg | ORAL_TABLET | Freq: Once | ORAL | Status: DC | PRN

## 2020-06-22 MED ORDER — GABAPENTIN 300 MG PO CAPS
300.0000 mg | ORAL_CAPSULE | ORAL | Status: AC
  Administered 2020-06-22: 300 mg via ORAL

## 2020-06-22 MED ORDER — LACTATED RINGERS IV SOLN
INTRAVENOUS | Status: DC | PRN
Start: 2020-06-22 — End: 2020-06-22

## 2020-06-22 MED ORDER — DEXAMETHASONE SODIUM PHOSPHATE 10 MG/ML IJ SOLN
INTRAMUSCULAR | Status: DC | PRN
  Administered 2020-06-22: 5 mg via INTRAVENOUS

## 2020-06-22 MED ORDER — ONDANSETRON HCL 4 MG/2ML IJ SOLN
INTRAMUSCULAR | Status: AC
  Filled 2020-06-22: qty 2

## 2020-06-22 MED ORDER — LIDOCAINE 2% (20 MG/ML) 5 ML SYRINGE
INTRAMUSCULAR | Status: AC
  Filled 2020-06-22: qty 5

## 2020-06-22 MED ORDER — FENTANYL CITRATE (PF) 100 MCG/2ML IJ SOLN
INTRAMUSCULAR | Status: AC
  Filled 2020-06-22: qty 2

## 2020-06-22 MED ORDER — FENTANYL CITRATE (PF) 100 MCG/2ML IJ SOLN: 25.0000 ug | INTRAMUSCULAR | Status: DC | PRN

## 2020-06-22 MED ORDER — VASOPRESSIN 20 UNIT/ML IV SOLN
INTRAVENOUS | Status: AC
  Filled 2020-06-22: qty 1

## 2020-06-22 SURGICAL SUPPLY — 61 items
APPLIER CLIP 5 13 M/L LIGAMAX5 (MISCELLANEOUS)
APPLIER CLIP ROT 10 11.4 M/L (STAPLE)
BLADE EXTENDED COATED 6.5IN (ELECTRODE) IMPLANT
CABLE HIGH FREQUENCY MONO STRZ (ELECTRODE) ×1 IMPLANT
CELLS DAT CNTRL 66122 CELL SVR (MISCELLANEOUS) IMPLANT
CLIP APPLIE 5 13 M/L LIGAMAX5 (MISCELLANEOUS) IMPLANT
CLIP APPLIE ROT 10 11.4 M/L (STAPLE) IMPLANT
COUNTER NEEDLE 20 DBL MAG RED (NEEDLE) ×3 IMPLANT
COVER MAYO STAND STRL (DRAPES) ×3 IMPLANT
COVER WAND RF STERILE (DRAPES) IMPLANT
DECANTER SPIKE VIAL GLASS SM (MISCELLANEOUS) ×3 IMPLANT
DERMABOND ADVANCED (GAUZE/BANDAGES/DRESSINGS)
DERMABOND ADVANCED .7 DNX12 (GAUZE/BANDAGES/DRESSINGS) IMPLANT
DRAPE LAPAROSCOPIC ABDOMINAL (DRAPES) ×3 IMPLANT
DRSG OPSITE POSTOP 4X10 (GAUZE/BANDAGES/DRESSINGS) ×2 IMPLANT
DRSG OPSITE POSTOP 4X6 (GAUZE/BANDAGES/DRESSINGS) IMPLANT
DRSG OPSITE POSTOP 4X8 (GAUZE/BANDAGES/DRESSINGS) IMPLANT
DRSG TEGADERM 2-3/8X2-3/4 SM (GAUZE/BANDAGES/DRESSINGS) ×2 IMPLANT
ELECT PENCIL ROCKER SW 15FT (MISCELLANEOUS) ×2 IMPLANT
ELECT REM PT RETURN 15FT ADLT (MISCELLANEOUS) ×3 IMPLANT
GAUZE SPONGE 2X2 8PLY STRL LF (GAUZE/BANDAGES/DRESSINGS) IMPLANT
GAUZE SPONGE 4X4 12PLY STRL (GAUZE/BANDAGES/DRESSINGS) IMPLANT
GLOVE BIO SURGEON STRL SZ7.5 (GLOVE) ×6 IMPLANT
GOWN STRL REUS W/TWL XL LVL3 (GOWN DISPOSABLE) ×16 IMPLANT
KIT TURNOVER KIT A (KITS) IMPLANT
LIGASURE IMPACT 36 18CM CVD LR (INSTRUMENTS) ×2 IMPLANT
PACK COLON (CUSTOM PROCEDURE TRAY) ×3 IMPLANT
PENCIL SMOKE EVACUATOR (MISCELLANEOUS) IMPLANT
RELOAD PROXIMATE 75MM BLUE (ENDOMECHANICALS) ×6 IMPLANT
RELOAD STAPLE 75 3.8 BLU REG (ENDOMECHANICALS) IMPLANT
RETRACTOR WND ALEXIS 18 MED (MISCELLANEOUS) IMPLANT
RTRCTR WOUND ALEXIS 18CM MED (MISCELLANEOUS)
SCISSORS LAP 5X35 DISP (ENDOMECHANICALS) ×3 IMPLANT
SET IRRIG TUBING LAPAROSCOPIC (IRRIGATION / IRRIGATOR) ×3 IMPLANT
SET TUBE SMOKE EVAC HIGH FLOW (TUBING) ×3 IMPLANT
SHEARS HARMONIC ACE PLUS 36CM (ENDOMECHANICALS) ×3 IMPLANT
SLEEVE XCEL OPT CAN 5 100 (ENDOMECHANICALS) ×6 IMPLANT
SPONGE GAUZE 2X2 STER 10/PKG (GAUZE/BANDAGES/DRESSINGS) ×2
SPONGE LAP 18X18 RF (DISPOSABLE) ×2 IMPLANT
STAPLER GUN LINEAR PROX 60 (STAPLE) ×2 IMPLANT
STAPLER PROXIMATE 75MM BLUE (STAPLE) ×2 IMPLANT
STAPLER VISISTAT 35W (STAPLE) IMPLANT
SURGILUBE 2OZ TUBE FLIPTOP (MISCELLANEOUS) IMPLANT
SUT PDS AB 1 CTX 36 (SUTURE) IMPLANT
SUT PDS AB 1 TP1 96 (SUTURE) ×4 IMPLANT
SUT PROLENE 2 0 SH DA (SUTURE) ×1 IMPLANT
SUT SILK 2 0 (SUTURE) ×3
SUT SILK 2 0 SH CR/8 (SUTURE) ×1 IMPLANT
SUT SILK 2 0SH CR/8 30 (SUTURE) ×2 IMPLANT
SUT SILK 2-0 18XBRD TIE 12 (SUTURE) ×1 IMPLANT
SUT SILK 3 0 (SUTURE) ×3
SUT SILK 3 0 SH CR/8 (SUTURE) ×5 IMPLANT
SUT SILK 3-0 18XBRD TIE 12 (SUTURE) ×1 IMPLANT
SUT VICRYL 0 UR6 27IN ABS (SUTURE) ×2 IMPLANT
TOWEL OR NON WOVEN STRL DISP B (DISPOSABLE) ×3 IMPLANT
TRAY FOLEY MTR SLVR 14FR STAT (SET/KITS/TRAYS/PACK) ×2 IMPLANT
TROCAR BLADELESS OPT 5 100 (ENDOMECHANICALS) ×3 IMPLANT
TROCAR XCEL BLUNT TIP 100MML (ENDOMECHANICALS) ×2 IMPLANT
TROCAR XCEL NON-BLD 11X100MML (ENDOMECHANICALS) IMPLANT
TUBING CONNECTING 10 (TUBING) ×2 IMPLANT
TUBING CONNECTING 10' (TUBING) ×2

## 2020-06-22 NOTE — Op Note (Signed)
06/22/2020  12:01 PM  PATIENT:  [name]  86 y.o. female  PRE-OPERATIVE DIAGNOSIS:  CECAL CANCER  POST-OPERATIVE DIAGNOSIS:  CECAL CANCER  PROCEDURE:  Procedure(s): LAPAROSCOPIC RIGHT COLECTOMY (Right)  SURGEON:  Surgeon(s) and Role:    * Jovita Kussmaul, MD - Primary    * White, Sharon Mt, MD - Assisting  PHYSICIAN ASSISTANT:   ASSISTANTS: Dr. Dema Severin   ANESTHESIA:   general  EBL:  50 mL   BLOOD ADMINISTERED:none  DRAINS: none   LOCAL MEDICATIONS USED:  MARCAINE     SPECIMEN:  Source of Specimen:  terminal ileum and right colon  DISPOSITION OF SPECIMEN:  PATHOLOGY  COUNTS:  YES  TOURNIQUET:  * No tourniquets in log *  DICTATION: .Dragon Dictation   After informed consent was obtained the patient was brought to the operating room and placed in the supine position on the operating table. After adequate induction of general anesthesia the patient's abdomen was prepped with ChloraPrep, allowed to dry, and draped in usual sterile manner. An appropriate timeout was performed. The area above the umbilicus was infiltrated with quarter percent Marcaine. A small vertically oriented incision was made with a 15 blade knife. The incision was carried through the subcutaneous tissue bluntly with a hemostat and [employer] retractors until the linea alba identified. The linea alba was incised with the 15 blade knife and each side was grasped with Kocher clamps and elevated anteriorly. The preperitoneal space was then probed bluntly with a hemostat until the peritoneum was opened and access was gained to the abdominal cavity. A 0 Vicryl pursestring stitch was placed in the fashion around the opening. A Hassan cannula was placed through the opening and anchored in place with the previous placed Vicryl pursestring stitch. The abdomen was then insufflated with carbon dioxide without difficulty. A 5 mm port was placed in the right lower quadrant and another in the right upper quadrant under  direct vision. The camera was moved to the right lower quadrant port. The abdomen was inspected and the only significant abnormality was the tattooed lesion in the right colon and cirrhosis of the liver. The right colon was then mobilized by incising its retroperitoneal attachment along the white line of Toldt. The tissue around the cancer seemed a little bit more stuck than what was expected. The distal colon was also quite redundant and dilated. This made it difficult to see. At this point I made an upper midline incision incorporating the Hassan cannula port site with a 10 blade knife. The incision was opened under direct vision. I was then able to bring the right colon up into the wound. I did some more mobilization of the right colon. I was also able to dissected out the omentum off of the transverse colon and free the hepatic flexure of the colon. We did identify the duodenum during this dissection and care was taken to stay away from that structure. Next a site was chosen at the hepatic flexure and at the terminal ileum for division of the intestine. The mesentery at each point was opened sharply with the electrocautery and the colon and small bowel were then divided with a single firing of a GIA-75 stapler at each point. The mesentery to the right colon was then taken down sharply with the LigaSure. Once the specimen was removed it was sent to pathology for further evaluation. The terminal ileum and transverse colon reached each other easily with no tension. A small opening was made on the antimesenteric  surface of each limb of the colon and small intestine. Each limb of a GIA-75 stapler was then placed down the appropriate limb of small bowel or colon, clamped, and fired thereby creating a nice widely patent enteroenterostomy. The common opening was closed with a single firing of a TA 60 stapler. The staple line was then imbricated with several 2-0 silk Lembert stitches including a 2-0 silk crotch stitch.  The mesenteric defect was wide and friable and so this was not closed. The abdomen was then irrigated with copious amounts of saline. All drapes gowns and gloves were changed. The fascia of the anterior abdominal wall was then closed with 2 running #1 double-stranded looped PDS sutures. The subcutaneous tissue was irrigated with copious amounts of saline and the skin was closed with staples. Sterile dressings were applied. The patient tolerated the procedure well. At the end of the case all needle sponge and instrument counts were correct. The patient was then awakened and taken to recovery in stable condition.  PLAN OF CARE: Admit to inpatient   PATIENT DISPOSITION:  PACU - hemodynamically stable.   Delay start of Pharmacological VTE agent (>24hrs) due to surgical blood loss or risk of bleeding: no

## 2020-06-22 NOTE — Progress Notes (Signed)
PROGRESS NOTE    [name]  QPY:[id number] DOB: 15-Jul-1926 DOA: 06/11/2020 PCP: [name], MD   No chief complaint on file.   Brief Narrative:  86 year old lady with prior history of tachybradycardia syndrome s/p biventricular pacemaker placement in 2007, severe MR s/p repair, chronic atrial fibrillation not on anticoagulation secondary to GI bleed, abnormal esophageal motility consistent with presbyesophagus, gastritis on last endoscopy with balloon dilatation by [name], pulmonary hypertension presents with 2 days of dysuria, and abdominal pain, black stools for several weeks, and increased confusion admitted for sepsis secondary to urinary tract infection in the setting of obstructive uropathy and acute on chronic anemia.  CT of the abdomen and pelvis shows left-sided moderate hydronephrosis with intrarenal calculi bilaterally, left ureteral calculi and soft tissue fullness in the cecal area.  Patient underwent ureteral stent placement by urology on 06/11/2020.  And was started on IV Rocephin and admitted to [facility] service for further evaluation and management. Urine culture showed lactobacillus transition IV Rocephin to oral amoxicillin for another 2 weeks to complete the course. [name] with Urology suggested he will post pone the stent removal to a later date. She underwent CT abd showing cecal mass concerning for malignancy. She underwent colonoscopy on 06/18/20 by [name] showing large cecal mass concerning for malignancy. Gen surgery consulted for further management. Cecal mass pathology shows adenocarcinoma. Cardiology consulted for pre op clearance as she might need surgical intervention.  Oncology consulted who recommended CT chest for further staging.  Patient for surgery tomorrow 06/21/2020.   Assessment & Plan:   Principal Problem:   Acute lower UTI Active Problems:   Liver cirrhosis (HCC)   CKD (chronic kidney disease) stage 3, GFR 30-59 ml/min   Anemia    Biventricular cardiac pacemaker in situ   Permanent atrial fibrillation (HCC)   Chronic diastolic CHF (congestive heart failure) (HCC)   Gout   S/P MVR (mitral valve repair)   AKI (acute kidney injury) ([place])   Orthostatic hypotension dysautonomic syndrome (HCC)   GI bleeding   Obstructive uropathy   Sepsis secondary to UTI ([place])   Hydronephrosis of left kidney   Left ureteral stone   Advanced care planning/counseling discussion   Goals of care, counseling/discussion   Palliative care by specialist   DNR (do not resuscitate)   Acute on chronic renal insufficiency   Iron deficiency anemia due to chronic blood loss  1 SIRS/Sepsis secondary to UTI Patient on admission met criteria for systemic inflammatory response with, MAP < 65, AKI, urinalysis worrisome for UTI.  Urine cultures positive for lactobacillus.  Patient noted to have left-sided moderate hydronephrosis secondary to left ureteral stone and underwent left ureteral stent placement 06/11/2020.  Patient was on IV Rocephin which has subsequently been discontinued.  Amoxicillin started 06/20/2020 to complete a 10 to 14-day course of antibiotic treatment as patient with stent placement and left urethral stone.  Patient currently afebrile.  Follow.   2.  Obstructive uropathy with left-sided moderate hydronephrosis secondary to left ureteral stone Noted on CT abdomen and pelvis.  Patient seen in consultation by urology and underwent left ureteral stent placement 06/11/2020.  Foley catheter discontinued.  Urine cultures positive for lactobacillus.  Patient on IV Rocephin which is subsequently been discontinued.  Amoxicillin started 06/20/2020 to complete a 10 - 14-day course of antibiotic treatment.  Patient with good urine output.  Clinical improvement.  Per urology reschedule uteroscopy for about 2 to 3 weeks out to give patient opportunity to recover from pending  colon resection.  Urology following and I appreciate the input and  recommendations.  3.  Cecal adenocarcinoma Soft tissue fullness noted in cecal area on CT abdomen and pelvis.  Repeat CT abdomen and pelvis showing a lobulated mass in the cecum concerning for malignancy.  GI consulted and patient underwent colonoscopy with biopsy on 06/18/2020 per Dr. Michail Sermon with pathology positive for adenocarcinoma.  Patient seen in consultation by general surgery to see whether she is a candidate for diverting colostomy.  Patient seen in consultation by cardiology for preop clearance.  Patient currently not obstructed at this time.  CT chest with interval development of 10 mm subpleural rounded density seen posteriorly in the right lung apex.  Follow-up unenhanced chest CT in 3 months recommended to ensure stability or resolution.  Patient s/p laparoscopic right colectomy 06/22/2020.  Oncology consulted and are following and will evaluate postop to determine if further adjuvant treatment is feasible or warranted.  Palliative care also following.  Appreciate oncology, general surgery, palliative care input and recommendations.   4.  Chronic combined systolic diastolic heart failure Patient appears compensated.  2D echo from May 2021 with a EF of 40%.  Patient denies any significant shortness of breath.  Patient received a dose of IV Lasix yesterday.  Resume Demadex today.  Follow.    5.  Chronic hypotension Continue midodrine.   6.  Permanent atrial fibrillation Rate controlled.  Patient deemed not a anticoagulation candidate secondary to history of GI bleed.  Cardiology following.   7.  Tachybradycardia syndrome with BiV PPM Stable.  Last interrogation April 2021 patient noted to be V pacing.  Per cardiology.  8.  Acute on chronic kidney disease stage IIIb Likely post renal azotemia in the setting of obstructive uropathy in the setting of sepsis and relative hypotension on admission.  Creatinine noted to have peaked at 3 and trending down.  Creatinine currently at 1.15.  Urine  output 825 cc over the past 24 hours.  Patient received a dose of IV Lasix yesterday.  Demadex has been resumed today.  Monitor renal function.  Follow.    9.  Hypernatremia Likely secondary to free water deficit secondary to poor oral intake. Improved. Diuretics resumed today. Follow.  10.  Iron deficiency anemia secondary to colonic cancer bleed/anemia of chronic disease Status post recent EGD showing gastritis and presbyesophagus.  Baseline hemoglobin approximately 9.  On admission hemoglobin noted to be 7.9.  Status post 1 unit transfusion packed red blood cells hemoglobin currently at 8.3 and currently stable.  Monitor H&H postop.  Hematology/oncology recommending IV Feraheme 510 mg x 1 while in-house if okay with general surgery with resumption of iron sulfate 325 mg p.o. daily at time of discharge.  Transfusion threshold hemoglobin < 7.     DVT prophylaxis: SCDs.  History of GI bleed. Code Status: DNR Family Communication: Patient in PACU.   Disposition:   Status is: Inpatient    Dispo: The patient is from: Assisted living facility              Anticipated d/c is to: Assisted living facility              Anticipated d/c date is: To be determined.              Patient currently being worked up for cecal cancer and s/p laparoscopic right colectomy 06/22/2020.  Oncology consultation following.  Not stable for discharge.        Consultants:   General surgery: Dr.Toth  III 06/19/2020  Cardiology: Dr. Radford Pax 06/19/2020  Gastroenterology: Dr. Watt Climes 06/11/2020  Urology: Dr. Jeffie Pollock 06/11/2020  Oncology Dr. Lorenso Courier 06/20/2020  Palliative care: Dr. Rowe Pavy 06/20/2020  Procedures:  CT chest 06/28/2020  CT abdomen and pelvis 06/15/2020, 06/11/2020  Chest x-ray 06/11/2020  Colonoscopy 06/18/2020--per Dr. Michail Sermon gastroenterology  Cystoscopy with left retrograde pyelogram and interpretation/cystoscopy with insertion of left double-J stent per Dr.Wrenn 06/11/2020  Transfusion 1 unit packed red  blood cells 06/11/2020  Laparoscopic right colectomy by Dr. Marlou Starks III 06/22/2020  Antimicrobials:  IV Rocephin 06/11/2020>>>> 06/14/2020  Amoxicillin 06/20/2020   Subjective: Patient in PACU. Just returned from the OR. Drowsy. Opens eyes to verbal stimuli. Following commands. Denies chest pain or shortness of breath.   Objective: Vitals:   06/22/20 0856 06/22/20 1210 06/22/20 1215 06/22/20 1230  BP: 126/64 (!) 124/46 (!) 109/55 (!) 124/57  Pulse: 73 73 70 70  Resp: _0 Temp: 97.8 F (36.6 C) (!) 96.2 F (35.7 C)    TempSrc: Oral     SpO2: 100% 100% 100% 100%  Weight:      Height:        Intake/Output Summary (Last 24 hours) at 06/22/2020 1249 Last data filed at 06/22/2020 1213 Gross per 24 hour  Intake 2430 ml  Output 1050 ml  Net 1380 ml   Filed Weights   06/19/20 0600 06/20/20 0500 06/22/20 0500  Weight: 94.5 kg 90 kg 90.1 kg    Examination:  General exam: NAD Respiratory system: Clear to auscultation anterior lung fields. No wheezes, no crackles, no rhonchi. No use of accessory muscles of respiration. Fair air movement. Cardiovascular system: RRR no murmurs rubs or gallops. No JVD. Trace lower extremity edema.  Gastrointestinal system: Abdomen is soft, obese, nondistended, some diffuse tenderness to palpation. Honeycomb dressing in place. Hypoactive bowel sounds. Central nervous system: Drowsy. Moving extremities spontaneously.  Extremities: Symmetric 5 x 5 power. Skin: No rashes, lesions or ulcers Psychiatry: Judgement and insight appear normal. Mood & affect appropriate.     Data Reviewed: I have personally reviewed following labs and imaging studies  CBC: Recent Labs  Lab 06/17/20 0938 06/20/20 0604 06/21/20 0518 06/22/20 0510  WBC 6.0 5.3 6.3 5.0  HGB 9.2* 8.7* 8.6* 8.3*  HCT 32.1* 30.8* 29.9* 28.2*  MCV 87.7 88.8 87.9 87.0  PLT 220 185 179 709    Basic Metabolic Panel: Recent Labs  Lab 06/16/20 0602 06/17/20 0938 06/20/20 0604  06/21/20 0518 06/22/20 0510  NA 145 143 141 136 139  K 3.7 3.9 3.9 3.8 4.0  CL 117* 115* 114* 113* 114*  CO2 21* 21* 21* 18* 18*  GLUCOSE 84 110* 86 84 86  BUN _1 CREATININE 1.37* 1.42* 1.27* 1.24* 1.15*  CALCIUM 7.8* 7.7* 7.7* 7.3* 7.3*  MG  --   --   --  1.6* 2.5*    GFR: Estimated Creatinine Clearance: 33.2 mL/min (A) (by C-G formula based on SCr of 1.15 mg/dL (H)).  Liver Function Tests: Recent Labs  Lab 06/20/20 0604 06/21/20 0518  AST 16 15  ALT 12 12  ALKPHOS 87 77  BILITOT 0.7 0.7  PROT 4.9* 4.5*  ALBUMIN 2.2* 2.1*    CBG: No results for input(s): GLUCAP in the last 168 hours.   Recent Results (from the past 240 hour(s))  Surgical pcr screen     Status: None   Collection Time: 06/20/20 11:00 PM   Specimen: Nasal Mucosa; Nasal Swab  Result Value Ref  Range Status   MRSA, PCR NEGATIVE NEGATIVE Final   Staphylococcus aureus NEGATIVE NEGATIVE Final    Comment: (NOTE) The Xpert SA Assay (FDA approved for NASAL specimens in patients 29 years of age and older), is one component of a comprehensive surveillance program. It is not intended to diagnose infection nor to guide or monitor treatment. Performed at [facility], [address] [place], [place] [address]          Radiology Studies: CT CHEST WO CONTRAST  Result Date: 06/20/2020 CLINICAL DATA:  Colorectal cancer. EXAM: CT CHEST WITHOUT CONTRAST TECHNIQUE: Multidetector CT imaging of the chest was performed following the standard protocol without IV contrast. COMPARISON:  June 15, 2020. FINDINGS: Cardiovascular: Atherosclerosis of thoracic aorta is noted without aneurysm formation. Mild cardiomegaly is noted. No pericardial effusion is noted. Left-sided pacemaker is noted. Status post aortic mitral valve repair. Mediastinum/Nodes: No enlarged mediastinal or axillary lymph nodes. Thyroid gland, trachea, and esophagus demonstrate no significant findings. Lungs/Pleura: No  pneumothorax is noted. Small bilateral pleural effusions are noted with adjacent subsegmental atelectasis. Interval development of 10 mm subpleural rounded density seen posteriorly in the right lung apex best seen on image number 28 of series 5. This may simply represent scarring or focal atelectasis, but neoplasm cannot be excluded. Upper Abdomen: Nodular hepatic contours are noted suggesting possible hepatic cirrhosis. Musculoskeletal: No chest wall mass or suspicious bone lesions identified. IMPRESSION: 1. Interval development of 10 mm subpleural rounded density seen posteriorly in the right lung apex. This may simply represent scarring or focal atelectasis, but neoplasm or metastatic lesion cannot be excluded. Follow-up unenhanced chest CT in 3 months is recommended to ensure stability or resolution. 2. Small bilateral pleural effusions are noted with adjacent subsegmental atelectasis. 3. Nodular hepatic contours are noted suggesting possible hepatic cirrhosis. Aortic Atherosclerosis (ICD10-I70.0). Electronically Signed   By: Marijo Conception M.D.   On: 06/20/2020 13:29   DG Chest Port 1 View  Result Date: 06/22/2020 CLINICAL DATA:  Central line placement EXAM: PORTABLE CHEST 1 VIEW COMPARISON:  06/12/2019 FINDINGS: 1 LEFT-sided pacemaker insert non wires overlies stable enlarged cardiac silhouette. Moderate LEFT effusion noted. Interval placement of a central venous line tip in distal SVC. No pneumothorax. IMPRESSION: 1. Central line placement without complication. 2. Stable LEFT pleural effusion. Electronically Signed   By: Suzy Bouchard M.D.   On: 06/22/2020 12:36        Scheduled Meds: . [MAR Hold] (feeding supplement) PROSource Plus  30 mL Oral BID BM  . alvimopan      . [MAR Hold] amoxicillin  500 mg Oral Q8H  . [MAR Hold] feeding supplement  1 Container Oral TID BM  . fentaNYL      . gabapentin      . [MAR Hold] midodrine  5 mg Oral TID WC  . [MAR Hold] pantoprazole  40 mg Oral BID  .  [MAR Hold] senna-docusate  2 tablet Oral BID  . [MAR Hold] sodium bicarbonate  650 mg Oral BID  . [MAR Hold] sodium chloride flush  3 mL Intravenous Q12H  . [MAR Hold] torsemide  20 mg Oral Daily   Continuous Infusions: . acetaminophen    . lactated ringers       LOS: 11 days    Time spent: 35 minutes    Irine Seal, MD [facility]   To contact the attending provider between 7A-7P or the covering provider during after hours 7P-7A, please log into the web site [email or web address] and  access using universal [place] password for that web site. If you do not have the password, please call the hospital operator.  06/22/2020, 12:49 PM

## 2020-06-22 NOTE — TOC Progression Note (Signed)
Transition of Care [facility]) - Progression Note    Patient Details  Name: [name] MRN: [id number] Date of Birth: [birth year]  [facility]) CM/SW Contact  Bernardo Brayman, Juliann Pulse, RN Phone Number: 06/22/2020, 10:02 AM  Clinical Narrative: Dtr Santiago Glad has reconsidered her choice for SNF-chose [facility] rep Irine Seal aware-may have bed available next week-Tues/Wed.For R colectomy.Not medically stable. Will need auth, & covid 24-48hrs prior d/c.     Expected Discharge Plan: [place] Barriers to Discharge: Continued Medical Work up  Expected Discharge Plan and Services Expected Discharge Plan: [place]   Discharge Planning Services: CM Consult Post Acute Care Choice: [place] Living arrangements for the past 2 months: [place]                                       Social Determinants of Health (SDOH) Interventions    Readmission Risk Interventions No flowsheet data found.

## 2020-06-22 NOTE — TOC Progression Note (Addendum)
Transition of Care [facility]) - Progression Note    Patient Details  Name: [name] MRN: [id number] Date of Birth: 1926-09-11  Transition of Care [facility]) CM/SW Contact  Breylon Sherrow, Juliann Pulse, RN Phone Number: 06/22/2020, 2:30 PM  Clinical Narrative:3:30p-[facility] rep Dorathy Daft will accept patient-she will coordinate w/dtr on LTC resources. Provided dtr w/medicare.gov website for LT facilities, will need to initiate auth once closer to medical stability. Will need COVID. [facility] rep Irine Seal checked benefits-patient in co pay days-will need to pay 10 co pays upfront-rep will inform dtr Karen-CM informed dtr that [facility] rep will call her with cost. Per dtr patient has gone to [facility]-5/25-04/23/20(approximately),then Home for several weeks,then to [facility]. CM has left message w/[facility] if able to take patient once she has gone to SNF short term. [facility] rep will address all these concerns with dtr during phone call. Await outcome.      Expected Discharge Plan: [place] Barriers to Discharge: Continued Medical Work up  Expected Discharge Plan and Services Expected Discharge Plan: [place]   Discharge Planning Services: CM Consult Post Acute Care Choice: [place] Living arrangements for the past 2 months: [place]                                       Social Determinants of Health (SDOH) Interventions    Readmission Risk Interventions No flowsheet data found.

## 2020-06-22 NOTE — Transfer of Care (Signed)
Immediate Anesthesia Transfer of Care Note  Patient: [name]  Procedure(s) Performed: LAPAROSCOPIC RIGHT COLECTOMY (Right Abdomen)  Patient Location: PACU  Anesthesia Type:General  Level of Consciousness: awake, alert  and oriented  Airway & Oxygen Therapy: Patient Spontanous Breathing and Patient connected to face mask oxygen  Post-op Assessment: Report given to RN and Post -op Vital signs reviewed and stable  Post vital signs: Reviewed and stable  Last Vitals:  Vitals Value Taken Time  BP 124/46 06/22/20 1210  Temp    Pulse 71 06/22/20 1212  Resp    SpO2 100 % 06/22/20 1212  Vitals shown include unvalidated device data.  Last Pain:  Vitals:   06/22/20 0900  TempSrc:   PainSc: 0-No pain      Patients Stated Pain Goal: 2 (19/47/12 5271)  Complications: No complications documented.

## 2020-06-22 NOTE — Progress Notes (Signed)
PT Cancellation Note  Patient Details Name: [name] MRN: [id number] DOB: 15-Oct-1926   Cancelled Treatment:    Reason Eval/Treat Not Completed: Patient at procedure or test/unavailable (surgery today)   [facility] 06/22/2020, 8:52 AM

## 2020-06-22 NOTE — Progress Notes (Signed)
Pt had large bm from rectum. Brown with red and bloody streaks. Upon being cleaned up RN noticed that the bottom of the pt honeycomb dressing on the abdomen was saturated with blood. Pt vitals all within normal range and states she feels fine.  Paged provider [place] to make aware. Also made oncoming nurse aware of findings.

## 2020-06-22 NOTE — Anesthesia Procedure Notes (Signed)
Central Venous Catheter Insertion Performed by: [name], MD, anesthesiologist Start/End8/04/2020 9:55 AM, 06/22/2020 10:04 AM Patient location: Pre-op. Preanesthetic checklist: patient identified, IV checked, site marked, risks and benefits discussed, surgical consent, monitors and equipment checked, pre-op evaluation, timeout performed and anesthesia consent Position: Trendelenburg Lidocaine 1% used for infiltration and patient sedated Hand hygiene performed , maximum sterile barriers used  and Seldinger technique used Catheter size: 8 Fr Total catheter length 16. Central line was placed.Double lumen Procedure performed using ultrasound guided technique. Ultrasound Notes:anatomy identified, needle tip was noted to be adjacent to the nerve/plexus identified, no ultrasound evidence of intravascular and/or intraneural injection and image(s) printed for medical record Attempts: 1 Following insertion, dressing applied, line sutured and Biopatch. Post procedure assessment: blood return through all ports  Patient tolerated the procedure well with no immediate complications.

## 2020-06-22 NOTE — Progress Notes (Signed)
   06/22/20 1437  Assess: MEWS Score  Temp (!) 97.5 F (36.4 C)  BP (!) 118/51  Pulse Rate 70  Resp (!) 24  SpO2 99 %  Assess: MEWS Score  MEWS Temp 0  MEWS Systolic 0  MEWS Pulse 0  MEWS RR 1  MEWS LOC 1  MEWS Score 2  MEWS Score Color Yellow  Assess: if the MEWS score is Yellow or Red  Were vital signs taken at a resting state? Yes  Focused Assessment No change from prior assessment  Early Detection of Sepsis Score *See Row Information* Low  MEWS guidelines implemented *See Row Information* Yes  Treat  MEWS Interventions Other (Comment) (pt given warm blankets and heat turned up in room.)  Take Vital Signs  Increase Vital Sign Frequency  Yellow: Q 2hr X 2 then Q 4hr X 2, if remains yellow, continue Q 4hrs  Escalate  MEWS: Escalate Yellow: discuss with charge nurse/RN and consider discussing with provider and RRT  Notify: Charge Nurse/RN  Name of Charge Nurse/RN Notified Baxter Flattery RN  Date Charge Nurse/RN Notified 06/22/20  Time Charge Nurse/RN Notified 1440  Document  Patient Outcome Stabilized after interventions

## 2020-06-22 NOTE — Anesthesia Procedure Notes (Signed)
Procedure Name: Intubation Date/Time: 06/22/2020 9:51 AM Performed by: Maxwell Caul, CRNA Pre-anesthesia Checklist: Patient identified, Emergency Drugs available, Suction available and Patient being monitored Patient Re-evaluated:Patient Re-evaluated prior to induction Oxygen Delivery Method: Circle system utilized Preoxygenation: Pre-oxygenation with 100% oxygen Induction Type: IV induction Ventilation: Mask ventilation without difficulty Laryngoscope Size: Mac and 4 Grade View: Grade I Tube type: Oral Tube size: 7.0 mm Number of attempts: 1 Airway Equipment and Method: Stylet Placement Confirmation: ETT inserted through vocal cords under direct vision,  positive ETCO2 and breath sounds checked- equal and bilateral Secured at: 21 cm Tube secured with: Tape Dental Injury: Teeth and Oropharynx as per pre-operative assessment

## 2020-06-22 NOTE — Interval H&P Note (Signed)
History and Physical Interval Note:  06/22/2020 9:16 AM  [name]  has presented today for surgery, with the diagnosis of CECAL Cancer.  The various methods of treatment have been discussed with the patient and family. After consideration of risks, benefits and other options for treatment, the patient has consented to  Procedure(s): LAPAROSCOPIC RIGHT COLECTOMY (Right) as a surgical intervention.  [name] history has been reviewed, patient examined, no change in status, stable for surgery.  I have reviewed the patient's chart and labs.  Questions were answered to the patient's satisfaction.     Autumn Messing III

## 2020-06-23 DIAGNOSIS — N390 Urinary tract infection, site not specified: Secondary | ICD-10-CM | POA: Diagnosis not present

## 2020-06-23 DIAGNOSIS — N201 Calculus of ureter: Secondary | ICD-10-CM | POA: Diagnosis not present

## 2020-06-23 DIAGNOSIS — N289 Disorder of kidney and ureter, unspecified: Secondary | ICD-10-CM | POA: Diagnosis not present

## 2020-06-23 DIAGNOSIS — N179 Acute kidney failure, unspecified: Secondary | ICD-10-CM | POA: Diagnosis not present

## 2020-06-23 LAB — BASIC METABOLIC PANEL
Anion gap: 8 (ref 5–15)
BUN: 11 mg/dL (ref 8–23)
CO2: 17 mmol/L — ABNORMAL LOW (ref 22–32)
Calcium: 7.3 mg/dL — ABNORMAL LOW (ref 8.9–10.3)
Chloride: 109 mmol/L (ref 98–111)
Creatinine, Ser: 1.18 mg/dL — ABNORMAL HIGH (ref 0.44–1.00)
GFR calc Af Amer: 47 mL/min — ABNORMAL LOW (ref >60)
GFR calc non Af Amer: 40 mL/min — ABNORMAL LOW (ref >60)
Glucose, Bld: 84 mg/dL (ref 70–99)
Potassium: 4.6 mmol/L (ref 3.5–5.1)
Sodium: 134 mmol/L — ABNORMAL LOW (ref 135–145)

## 2020-06-23 LAB — CBC
HCT: 28.8 % — ABNORMAL LOW (ref 36.0–46.0)
Hemoglobin: 8.4 g/dL — ABNORMAL LOW (ref 12.0–15.0)
MCH: 25.3 pg — ABNORMAL LOW (ref 26.0–34.0)
MCHC: 29.2 g/dL — ABNORMAL LOW (ref 30.0–36.0)
MCV: 86.7 fL (ref 80.0–100.0)
Platelets: 171 10*3/uL (ref 150–400)
RBC: 3.32 MIL/uL — ABNORMAL LOW (ref 3.87–5.11)
RDW: 17.9 % — ABNORMAL HIGH (ref 11.5–15.5)
WBC: 7 10*3/uL (ref 4.0–10.5)
nRBC: 0 % (ref 0.0–0.2)

## 2020-06-23 MED ORDER — ACETAMINOPHEN 500 MG PO TABS
1000.0000 mg | ORAL_TABLET | Freq: Four times a day (QID) | ORAL | Status: DC
  Administered 2020-06-23 – 2020-07-10 (×44): 1000 mg via ORAL
  Filled 2020-06-23 (×53): qty 2

## 2020-06-23 MED ORDER — TORSEMIDE 20 MG PO TABS
20.0000 mg | ORAL_TABLET | Freq: Every day | ORAL | Status: DC
  Administered 2020-06-24: 20 mg via ORAL
  Filled 2020-06-23: qty 1

## 2020-06-23 MED ORDER — DOCUSATE SODIUM 100 MG PO CAPS
100.0000 mg | ORAL_CAPSULE | Freq: Two times a day (BID) | ORAL | Status: DC
  Administered 2020-06-23 – 2020-06-24 (×3): 100 mg via ORAL
  Filled 2020-06-23 (×3): qty 1

## 2020-06-23 NOTE — Progress Notes (Signed)
1 Day Post-Op    CC:  Subjective: Pain well controlled, having BMs, some blood  Objective: Vital signs in last 24 hours: Temp:  [95.9 F (35.5 C)-97.8 F (36.6 C)] 97.8 F (36.6 C) (08/07 0526) Pulse Rate:  [69-75] 70 (08/07 0526) Resp:  [15-24] 16 (08/07 0526) BP: (98-129)/(46-64) 100/51 (08/07 0526) SpO2:  [96 %-100 %] 98 % (08/07 0526) Weight:  [92.3 kg] 92.3 kg (08/07 0526) Last BM Date: 06/22/20  Intake/Output from previous day: 08/06 0701 - 08/07 0700 In: 1803 [I.V.:1703; IV Piggyback:100] Out: 275 [Urine:225; Blood:50] Intake/Output this shift: Total I/O In: -  Out: 300 [Urine:300]  PE: Gen: Alert, NAD, pleasant HEENT: EOM's intact, pupils equal and round Card: RRR Pulm: CTAB, no W/R/R, rate and effort normal Ext: calves soft and nontender Abd: Soft,mildly distended, tender along R hemiabdomen. Incision c/d/i with old blood on honeycomb, no cellulitis or hematoma Skin: no rashes noted, warm and dry    Lab Results:  Recent Labs    06/22/20 0510 06/23/20 0600  WBC 5.0 7.0  HGB 8.3* 8.4*  HCT 28.2* 28.8*  PLT 158 171    BMET Recent Labs    06/22/20 0510 06/23/20 0600  NA 139 134*  K 4.0 4.6  CL 114* 109  CO2 18* 17*  GLUCOSE 86 84  BUN 12 11  CREATININE 1.15* 1.18*  CALCIUM 7.3* 7.3*   PT/INR No results for input(s): LABPROT, INR in the last 72 hours.  Recent Labs  Lab 06/20/20 0604 06/21/20 0518  AST 16 15  ALT 12 12  ALKPHOS 87 77  BILITOT 0.7 0.7  PROT 4.9* 4.5*  ALBUMIN 2.2* 2.1*     Lipase     Component Value Date/Time   LIPASE 27 06/11/2020 0123     Medications: . (feeding supplement) PROSource Plus  30 mL Oral BID BM  . acetaminophen  1,000 mg Oral Q6H  . amoxicillin  500 mg Oral Q8H  . Chlorhexidine Gluconate Cloth  6 each Topical Daily  . docusate sodium  100 mg Oral BID  . feeding supplement  1 Container Oral TID BM  . midodrine  5 mg Oral TID WC  . pantoprazole  40 mg Oral BID  . sodium bicarbonate  650  mg Oral BID  . sodium chloride flush  3 mL Intravenous Q12H  . [START ON 06/24/2020] torsemide  20 mg Oral Daily   Anti-infectives (From admission, onward)   Start     Dose/Rate Route Frequency Ordered Stop   06/22/20 0911  sodium chloride 0.9 % with cefoTEtan (CEFOTAN) ADS Med       Note to Pharmacy: Georgena Spurling   : cabinet override      06/22/20 0911 06/22/20 1020   06/21/20 1115  cefoTEtan (CEFOTAN) 2 g in sodium chloride 0.9 % 100 mL IVPB        2 g 200 mL/hr over 30 Minutes Intravenous On call to O.R. 06/20/20 1346 06/22/20 0559   06/20/20 2200  amoxicillin (AMOXIL) capsule 500 mg     Discontinue     500 mg Oral Every 8 hours 06/20/20 1950     06/14/20 1000  amoxicillin (AMOXIL) capsule 500 mg        500 mg Oral Every 12 hours 06/14/20 0741 06/19/20 0959   06/11/20 1030  cefTRIAXone (ROCEPHIN) 1 g in sodium chloride 0.9 % 100 mL IVPB  Status:  Discontinued        1 g 200 mL/hr over 30 Minutes Intravenous  Every 24 hours 06/11/20 1021 06/14/20 0741   06/11/20 0300  cefTRIAXone (ROCEPHIN) 1 g in sodium chloride 0.9 % 100 mL IVPB        1 g 200 mL/hr over 30 Minutes Intravenous  Once 06/11/20 0259 06/11/20 0458       Assessment/Plan SIRS secondary to UTI/GI bleed Tachy-Brady syndrome - PTVP 11/2014 Severe MR s/p repair with 28 mm annuloplasty ring/oversew LAA, 2003 Hx NSVT/chronic atrial fibrillation-not on anticoagulation secondary to GI bleed Hx CHF Hx pulmonary hypertension Hx anemia Hx CKD with AKI Hx gastritis Hx recent balloon dilatation [name] Hx spinal stenosis Hx hypertension/hypertension-on midodrine at home Hydronephrosis with cystoscopy/left retrograde pyelogram/insertion of left double-J stent 06/11/2020;Dr. Jeffie Pollock Hx remote tobacco use Malnutrition - prealbumin 11 (8/3)  Cecal Mass - adenocarcinoma - colonoscopy 06/18/20, Dr. Michail Sermon  >> path: adenocarcinoma - CEA 575 (8/3) -S/p lap right hemicolectomy 8/5 Dr Marlou Starks  FEN: IV fluids/clear  liquids, Boost breeze ID: Rocephin 7/26-7/28; amoxicillin 7/29 >> day#8 DVT: SCD's, ok for chemical DVT prophylaxis from surgical standpoint Follow-up: TBD  Plan:Continue clears, mobilize   LOS: 12 days    Clovis Riley, MD [facility] 06/23/2020, 8:43 AM Please see Amion for pager number during day hours 7:00am-4:30pm

## 2020-06-23 NOTE — Progress Notes (Signed)
PROGRESS NOTE    [name]  MHD:[id number] DOB: 04/10/1926 DOA: 06/11/2020 PCP: [name], MD   No chief complaint on file.   Brief Narrative:  86 year old lady with prior history of tachybradycardia syndrome s/p biventricular pacemaker placement in 2007, severe MR s/p repair, chronic atrial fibrillation not on anticoagulation secondary to GI bleed, abnormal esophageal motility consistent with presbyesophagus, gastritis on last endoscopy with balloon dilatation by [name], pulmonary hypertension presents with 2 days of dysuria, and abdominal pain, black stools for several weeks, and increased confusion admitted for sepsis secondary to urinary tract infection in the setting of obstructive uropathy and acute on chronic anemia.  CT of the abdomen and pelvis shows left-sided moderate hydronephrosis with intrarenal calculi bilaterally, left ureteral calculi and soft tissue fullness in the cecal area.  Patient underwent ureteral stent placement by urology on 06/11/2020.  And was started on IV Rocephin and admitted to [facility] service for further evaluation and management. Urine culture showed lactobacillus transition IV Rocephin to oral amoxicillin for another 2 weeks to complete the course. [name] with Urology suggested he will post pone the stent removal to a later date. She underwent CT abd showing cecal mass concerning for malignancy. She underwent colonoscopy on 06/18/20 by [name] showing large cecal mass concerning for malignancy. Gen surgery consulted for further management. Cecal mass pathology shows adenocarcinoma. Cardiology consulted for pre op clearance as she might need surgical intervention.  Oncology consulted who recommended CT chest for further staging.  Patient for surgery tomorrow 06/21/2020.   Assessment & Plan:   Principal Problem:   Acute lower UTI Active Problems:   Liver cirrhosis (HCC)   CKD (chronic kidney disease) stage 3, GFR 30-59 ml/min   Anemia    Biventricular cardiac pacemaker in situ   Permanent atrial fibrillation (HCC)   Chronic diastolic CHF (congestive heart failure) (HCC)   Gout   S/P MVR (mitral valve repair)   AKI (acute kidney injury) ([place])   Orthostatic hypotension dysautonomic syndrome (HCC)   GI bleeding   Obstructive uropathy   Sepsis secondary to UTI ([place])   Hydronephrosis of left kidney   Left ureteral stone   Advanced care planning/counseling discussion   Goals of care, counseling/discussion   Palliative care by specialist   DNR (do not resuscitate)   Acute on chronic renal insufficiency   Iron deficiency anemia due to chronic blood loss  1 SIRS/Sepsis secondary to UTI Patient on admission met criteria for systemic inflammatory response with, MAP < 65, AKI, urinalysis worrisome for UTI.  Urine cultures positive for lactobacillus.  Patient noted to have left-sided moderate hydronephrosis secondary to left ureteral stone and underwent left ureteral stent placement 06/11/2020.  Patient was on IV Rocephin which has subsequently been discontinued.  Amoxicillin started 06/20/2020 to complete a 14-day course of antibiotic treatment as patient with stent placement and left urethral stone.  Patient currently afebrile.  Follow.   2.  Obstructive uropathy with left-sided moderate hydronephrosis secondary to left ureteral stone Noted on CT abdomen and pelvis.  Patient seen in consultation by urology and underwent left ureteral stent placement 06/11/2020.  Foley catheter initially discontinued and placed back in perioperatively.  DC Foley catheter..  Urine cultures positive for lactobacillus.  Patient on IV Rocephin which is subsequently been discontinued.  Amoxicillin started 06/20/2020 to complete a 10 - 14-day course of antibiotic treatment.  Patient with good urine output.  Clinical improvement.  Per urology reschedule uteroscopy for about 2 to 3 weeks out  to give patient opportunity to recover from pending colon resection.  Urology  following and I appreciate the input and recommendations.  3.  Cecal adenocarcinoma Soft tissue fullness noted in cecal area on CT abdomen and pelvis.  Repeat CT abdomen and pelvis showing a lobulated mass in the cecum concerning for malignancy.  GI consulted and patient underwent colonoscopy with biopsy on 06/18/2020 per Dr. Michail Sermon with pathology positive for adenocarcinoma.  Patient seen in consultation by general surgery to see whether she is a candidate for diverting colostomy.  Patient seen in consultation by cardiology for preop clearance.  Patient currently not obstructed at this time.  CT chest with interval development of 10 mm subpleural rounded density seen posteriorly in the right lung apex.  Follow-up unenhanced chest CT in 3 months recommended to ensure stability or resolution.  Patient s/p laparoscopic right colectomy 06/22/2020.  Oncology consulted and are following and will evaluate postop to determine if further adjuvant treatment is feasible or warranted.  Palliative care also following.  Appreciate oncology, general surgery, palliative care input and recommendations.   4.  Chronic combined systolic diastolic heart failure Patient appears compensated.  2D echo from May 2021 with a EF of 40%.  Patient denies any significant shortness of breath.  Patient received a dose of IV Lasix on 06/21/2020.  Hold Demadex another day and resume tomorrow.  Follow.  5.  Chronic hypotension Midodrine.    6.  Permanent atrial fibrillation Currently rate controlled.  Patient not a anticoagulation candidate secondary to history of GI bleed.  Cardiology following.   7.  Tachybradycardia syndrome with BiV PPM Stable.  Last interrogation April 2021 patient noted to be V pacing.  Per cardiology.  8.  Acute on chronic kidney disease stage IIIb Likely post renal azotemia in the setting of obstructive uropathy in the setting of sepsis and relative hypotension on admission.  Creatinine noted to have peaked at 3  and trending down.  Creatinine currently at 1.18.  Urine output 225 c over the past 24 hours.  Patient received a dose of IV Lasix on 06/21/2020.  We will hold Demadex another day and resume tomorrow.  Follow.   9.  Hypernatremia Likely secondary to free water deficit secondary to poor oral intake. Improved. Diuretics held and will be resumed tomorrow.  Follow.   10.  Iron deficiency anemia secondary to colonic cancer bleed/anemia of chronic disease Status post recent EGD showing gastritis and presbyesophagus.  Baseline hemoglobin approximately 9.  On admission hemoglobin noted to be 7.9.  Status post 1 unit transfusion packed red blood cells hemoglobin currently at 8.4 and stable.  Monitor H&H postop.  Hematology/oncology recommending IV Feraheme 510 mg x 1 while in-house if okay with general surgery with resumption of iron sulfate 325 mg p.o. daily at time of discharge.  Transfusion threshold hemoglobin < 7.     DVT prophylaxis: SCDs.  History of GI bleed. Code Status: DNR Family Communication: No family at bedside. Disposition:   Status is: Inpatient    Dispo: The patient is from: Assisted living facility              Anticipated d/c is to: Assisted living facility              Anticipated d/c date is: To be determined.              Patient currently being worked up for cecal cancer and s/p laparoscopic right colectomy 06/22/2020.  Oncology consultation following.  Not stable  for discharge.        Consultants:   General surgery: Dr.Toth III 06/19/2020  Cardiology: Dr. Radford Pax 06/19/2020  Gastroenterology: Dr. Watt Climes 06/11/2020  Urology: Dr. Jeffie Pollock 06/11/2020  Oncology Dr. Lorenso Courier 06/20/2020  Palliative care: Dr. Rowe Pavy 06/20/2020  Procedures:  CT chest 06/28/2020  CT abdomen and pelvis 06/15/2020, 06/11/2020  Chest x-ray 06/11/2020  Colonoscopy 06/18/2020--per Dr. Michail Sermon gastroenterology  Cystoscopy with left retrograde pyelogram and interpretation/cystoscopy with insertion of left  double-J stent per Dr.Wrenn 06/11/2020  Transfusion 1 unit packed red blood cells 06/11/2020  Laparoscopic right colectomy by Dr. Marlou Starks III 06/22/2020  Antimicrobials:  IV Rocephin 06/11/2020>>>> 06/14/2020  Amoxicillin 06/20/2020   Subjective: Patient in room on the telephone.  Denies any chest pain or shortness of breath.  Feels abdominal pain has improved.  Having bowel movement with some streaks of blood noted per RN report from last night..   Objective: Vitals:   06/22/20 1858 06/22/20 2213 06/23/20 0231 06/23/20 0526  BP: (!) 104/52 117/61 (!) 98/52 (!) 100/51  Pulse: 71 69 70 70  Resp: (!) _0 Temp: (!) 97.5 F (36.4 C) 97.7 F (36.5 C) 97.8 F (36.6 C) 97.8 F (36.6 C)  TempSrc: Oral Oral Oral Oral  SpO2: 96% 100% 96% 98%  Weight:    92.3 kg  Height:        Intake/Output Summary (Last 24 hours) at 06/23/2020 1059 Last data filed at 06/23/2020 0900 Gross per 24 hour  Intake 2063 ml  Output 525 ml  Net 1538 ml   Filed Weights   06/20/20 0500 06/22/20 0500 06/23/20 0526  Weight: 90 kg 90.1 kg 92.3 kg    Examination:  General exam: NAD Respiratory system: CTAB.  No wheezes, no crackles, no rhonchi.  Normal respiratory effort.  No use of accessory muscles of respiration.  Fair air movement.  Speaking in full sentences.   Cardiovascular system: Regular rate rhythm no murmurs rubs or gallops.  No JVD.  No lower extremity edema.  Gastrointestinal system: Abdomen is obese, soft, nondistended, decreased diffuse tenderness to palpation.  Honeycomb dressing in place with dried blood noted.  Positive bowel sounds. Central nervous system: Alert and oriented. Moving extremities spontaneously.  Extremities: Symmetric 5 x 5 power. Skin: No rashes, lesions or ulcers Psychiatry: Judgement and insight appear normal. Mood & affect appropriate.     Data Reviewed: I have personally reviewed following labs and imaging studies  CBC: Recent Labs  Lab 06/17/20 0938  06/20/20 0604 06/21/20 0518 06/22/20 0510 06/23/20 0600  WBC 6.0 5.3 6.3 5.0 7.0  HGB 9.2* 8.7* 8.6* 8.3* 8.4*  HCT 32.1* 30.8* 29.9* 28.2* 28.8*  MCV 87.7 88.8 87.9 87.0 86.7  PLT 220 185 179 158 335    Basic Metabolic Panel: Recent Labs  Lab 06/17/20 0938 06/20/20 0604 06/21/20 0518 06/22/20 0510 06/23/20 0600  NA 143 141 136 139 134*  K 3.9 3.9 3.8 4.0 4.6  CL 115* 114* 113* 114* 109  CO2 21* 21* 18* 18* 17*  GLUCOSE 110* 86 84 86 84  BUN _1 CREATININE 1.42* 1.27* 1.24* 1.15* 1.18*  CALCIUM 7.7* 7.7* 7.3* 7.3* 7.3*  MG  --   --  1.6* 2.5*  --     GFR: Estimated Creatinine Clearance: 32.8 mL/min (A) (by C-G formula based on SCr of 1.18 mg/dL (H)).  Liver Function Tests: Recent Labs  Lab 06/20/20 0604 06/21/20 0518  AST 16 15  ALT 12 12  ALKPHOS  87 77  BILITOT 0.7 0.7  PROT 4.9* 4.5*  ALBUMIN 2.2* 2.1*    CBG: No results for input(s): GLUCAP in the last 168 hours.   Recent Results (from the past 240 hour(s))  Surgical pcr screen     Status: None   Collection Time: 06/20/20 11:00 PM   Specimen: Nasal Mucosa; Nasal Swab  Result Value Ref Range Status   MRSA, PCR NEGATIVE NEGATIVE Final   Staphylococcus aureus NEGATIVE NEGATIVE Final    Comment: (NOTE) The Xpert SA Assay (FDA approved for NASAL specimens in patients 4 years of age and older), is one component of a comprehensive surveillance program. It is not intended to diagnose infection nor to guide or monitor treatment. Performed at [facility], [place] [address] [place], [place] [address]          Radiology Studies: DG Chest Port 1 View  Result Date: 06/22/2020 CLINICAL DATA:  Central line placement EXAM: PORTABLE CHEST 1 VIEW COMPARISON:  06/12/2019 FINDINGS: 1 LEFT-sided pacemaker insert non wires overlies stable enlarged cardiac silhouette. Moderate LEFT effusion noted. Interval placement of a central venous line tip in distal SVC. No pneumothorax.  IMPRESSION: 1. Central line placement without complication. 2. Stable LEFT pleural effusion. Electronically Signed   By: Suzy Bouchard M.D.   On: 06/22/2020 12:36        Scheduled Meds: . (feeding supplement) PROSource Plus  30 mL Oral BID BM  . acetaminophen  1,000 mg Oral Q6H  . amoxicillin  500 mg Oral Q8H  . Chlorhexidine Gluconate Cloth  6 each Topical Daily  . docusate sodium  100 mg Oral BID  . feeding supplement  1 Container Oral TID BM  . midodrine  5 mg Oral TID WC  . pantoprazole  40 mg Oral BID  . sodium bicarbonate  650 mg Oral BID  . sodium chloride flush  3 mL Intravenous Q12H  . [START ON 06/24/2020] torsemide  20 mg Oral Daily   Continuous Infusions:    LOS: 12 days    Time spent: 35 minutes    Irine Seal, MD [facility]   To contact the attending provider between 7A-7P or the covering provider during after hours 7P-7A, please log into the web site [email or web address] and access using universal [place] password for that web site. If you do not have the password, please call the hospital operator.  06/23/2020, 10:59 AM

## 2020-06-24 ENCOUNTER — Inpatient Hospital Stay (HOSPITAL_COMMUNITY): Payer: Medicare Other

## 2020-06-24 DIAGNOSIS — D500 Iron deficiency anemia secondary to blood loss (chronic): Secondary | ICD-10-CM

## 2020-06-24 DIAGNOSIS — N390 Urinary tract infection, site not specified: Secondary | ICD-10-CM | POA: Diagnosis not present

## 2020-06-24 DIAGNOSIS — R1110 Vomiting, unspecified: Secondary | ICD-10-CM

## 2020-06-24 DIAGNOSIS — R112 Nausea with vomiting, unspecified: Secondary | ICD-10-CM

## 2020-06-24 DIAGNOSIS — N289 Disorder of kidney and ureter, unspecified: Secondary | ICD-10-CM | POA: Diagnosis not present

## 2020-06-24 DIAGNOSIS — Z7189 Other specified counseling: Secondary | ICD-10-CM | POA: Diagnosis not present

## 2020-06-24 DIAGNOSIS — C180 Malignant neoplasm of cecum: Secondary | ICD-10-CM

## 2020-06-24 DIAGNOSIS — D509 Iron deficiency anemia, unspecified: Secondary | ICD-10-CM | POA: Diagnosis not present

## 2020-06-24 DIAGNOSIS — K2971 Gastritis, unspecified, with bleeding: Secondary | ICD-10-CM

## 2020-06-24 DIAGNOSIS — N201 Calculus of ureter: Secondary | ICD-10-CM | POA: Diagnosis not present

## 2020-06-24 LAB — BASIC METABOLIC PANEL
Anion gap: 7 (ref 5–15)
BUN: 16 mg/dL (ref 8–23)
CO2: 18 mmol/L — ABNORMAL LOW (ref 22–32)
Calcium: 7.2 mg/dL — ABNORMAL LOW (ref 8.9–10.3)
Chloride: 111 mmol/L (ref 98–111)
Creatinine, Ser: 1.38 mg/dL — ABNORMAL HIGH (ref 0.44–1.00)
GFR calc Af Amer: 39 mL/min — ABNORMAL LOW (ref >60)
GFR calc non Af Amer: 33 mL/min — ABNORMAL LOW (ref >60)
Glucose, Bld: 98 mg/dL (ref 70–99)
Potassium: 4.3 mmol/L (ref 3.5–5.1)
Sodium: 136 mmol/L (ref 135–145)

## 2020-06-24 LAB — CBC
HCT: 26.9 % — ABNORMAL LOW (ref 36.0–46.0)
HCT: 28.2 % — ABNORMAL LOW (ref 36.0–46.0)
Hemoglobin: 7.7 g/dL — ABNORMAL LOW (ref 12.0–15.0)
Hemoglobin: 8.2 g/dL — ABNORMAL LOW (ref 12.0–15.0)
MCH: 25.2 pg — ABNORMAL LOW (ref 26.0–34.0)
MCH: 25.4 pg — ABNORMAL LOW (ref 26.0–34.0)
MCHC: 28.6 g/dL — ABNORMAL LOW (ref 30.0–36.0)
MCHC: 29.1 g/dL — ABNORMAL LOW (ref 30.0–36.0)
MCV: 87.9 fL (ref 80.0–100.0)
MCV: 87.3 fL (ref 80.0–100.0)
Platelets: 154 10*3/uL (ref 150–400)
Platelets: 159 10*3/uL (ref 150–400)
RBC: 3.06 MIL/uL — ABNORMAL LOW (ref 3.87–5.11)
RBC: 3.23 MIL/uL — ABNORMAL LOW (ref 3.87–5.11)
RDW: 18.1 % — ABNORMAL HIGH (ref 11.5–15.5)
RDW: 17.9 % — ABNORMAL HIGH (ref 11.5–15.5)
WBC: 6 10*3/uL (ref 4.0–10.5)
WBC: 6.4 10*3/uL (ref 4.0–10.5)
nRBC: 0 % (ref 0.0–0.2)
nRBC: 0 % (ref 0.0–0.2)

## 2020-06-24 LAB — MAGNESIUM: Magnesium: 2.1 mg/dL (ref 1.7–2.4)

## 2020-06-24 MED ORDER — SODIUM CHLORIDE 0.9 % IV SOLN
510.0000 mg | Freq: Once | INTRAVENOUS | Status: AC
  Administered 2020-06-24: 510 mg via INTRAVENOUS
  Filled 2020-06-24: qty 510

## 2020-06-24 MED ORDER — PIPERACILLIN-TAZOBACTAM 3.375 G IVPB
3.3750 g | Freq: Three times a day (TID) | INTRAVENOUS | Status: DC
  Administered 2020-06-24 – 2020-06-28 (×11): 3.375 g via INTRAVENOUS
  Filled 2020-06-24 (×10): qty 50

## 2020-06-24 MED ORDER — PANTOPRAZOLE SODIUM 40 MG PO TBEC
40.0000 mg | DELAYED_RELEASE_TABLET | Freq: Every day | ORAL | Status: DC
  Administered 2020-06-24 – 2020-07-14 (×21): 40 mg via ORAL
  Filled 2020-06-24 (×21): qty 1

## 2020-06-24 MED ORDER — ALVIMOPAN 12 MG PO CAPS: 12.0000 mg | ORAL_CAPSULE | ORAL | Status: DC

## 2020-06-24 MED ORDER — HYDROMORPHONE HCL 1 MG/ML IJ SOLN
0.5000 mg | INTRAMUSCULAR | Status: DC | PRN
  Administered 2020-06-24 – 2020-06-25 (×2): 1 mg via INTRAVENOUS
  Filled 2020-06-24 (×2): qty 1

## 2020-06-24 MED ORDER — PANTOPRAZOLE SODIUM 40 MG IV SOLR: 40.0000 mg | INTRAVENOUS | Status: DC

## 2020-06-24 MED ORDER — ALVIMOPAN 12 MG PO CAPS: 12.0000 mg | ORAL_CAPSULE | Freq: Two times a day (BID) | ORAL | Status: DC

## 2020-06-24 NOTE — Progress Notes (Signed)
Pharmacy Antibiotic Note  [name] is a 86 y.o. female admitted on 06/11/2020 with aspiration PNA.  Pharmacy has been consulted for Zosyn dosing.  Plan: Zosyn 3.375g IV q8h (4 hour infusion).  Height: 5\' 2"  (157.5 cm) Weight: 93.8 kg (206 lb 12.7 oz) IBW/kg (Calculated) : 50.1  Temp (24hrs), Avg:97.9 F (36.6 C), Min:97.6 F (36.4 C), Max:98.2 F (36.8 C)  Recent Labs  Lab 06/20/20 0604 06/20/20 0604 06/21/20 0518 06/22/20 0510 06/23/20 0600 06/24/20 0400 06/24/20 1405  WBC 5.3   < > 6.3 5.0 7.0 6.0 6.4  CREATININE 1.27*  --  1.24* 1.15* 1.18* 1.38*  --    < > = values in this interval not displayed.    Estimated Creatinine Clearance: 28.3 mL/min (A) (by C-G formula based on SCr of 1.38 mg/dL (H)).    No Known Allergies  Antimicrobials this admission: 7/26 CTX >> 7/28 7/29 Amoxicillin >> 8/8 8/8 Zosyn  >>   Dose adjustments this admission:  Microbiology results: 7/26 UCx: lactobacillus  Thank you for allowing pharmacy to be a part of this patient's care.  Minda Ditto PharmD 06/24/2020 8:45 PM

## 2020-06-24 NOTE — Progress Notes (Signed)
2 Days Post-Op    CC:  Subjective: Soreness in upper incision, tolerating liquids without n/v. Bowel movements thickening up some.   Objective: Vital signs in last 24 hours: Temp:  [97.6 F (36.4 C)-97.8 F (36.6 C)] 97.6 F (36.4 C) (08/08 0549) Pulse Rate:  [69-72] 69 (08/08 0549) Resp:  [16-20] 16 (08/08 0549) BP: (99-103)/(44-58) 99/46 (08/08 0549) SpO2:  [98 %-99 %] 99 % (08/08 0549) Weight:  [93.8 kg] 93.8 kg (08/08 0549) Last BM Date: 06/23/20  Intake/Output from previous day: 08/07 0701 - 08/08 0700 In: 840 [P.O.:840] Out: 450 [Urine:450] Intake/Output this shift: No intake/output data recorded.  PE: Gen: Alert, NAD, pleasant HEENT: EOM's intact, pupils equal and round Card: RRR, diffuse edema Pulm: CTAB, no W/R/R, rate and effort normal Ext: calves soft and nontender Abd: Soft,mildly distended, tender along R hemiabdomen and superior aspect of incision where there is palpable swelling but no erythema, induration or warmth. Copious serous drainage from lower incision saturating dressing and reinforcements. All dressings removed. No cellulitis, but fair amount of serous drainage from incision. 2 Staples removed and wound probed, packed and bulky gauze dressing applied. Skin: no rashes noted, warm and dry    Lab Results:  Recent Labs    06/23/20 0600 06/24/20 0400  WBC 7.0 6.0  HGB 8.4* 7.7*  HCT 28.8* 26.9*  PLT 171 154    BMET Recent Labs    06/23/20 0600 06/24/20 0400  NA 134* 136  K 4.6 4.3  CL 109 111  CO2 17* 18*  GLUCOSE 84 98  BUN 11 16  CREATININE 1.18* 1.38*  CALCIUM 7.3* 7.2*   PT/INR No results for input(s): LABPROT, INR in the last 72 hours.  Recent Labs  Lab 06/20/20 0604 06/21/20 0518  AST 16 15  ALT 12 12  ALKPHOS 87 77  BILITOT 0.7 0.7  PROT 4.9* 4.5*  ALBUMIN 2.2* 2.1*     Lipase     Component Value Date/Time   LIPASE 27 06/11/2020 0123     Medications: . acetaminophen  1,000 mg Oral Q6H  . Chlorhexidine  Gluconate Cloth  6 each Topical Daily  . docusate sodium  100 mg Oral BID  . feeding supplement  1 Container Oral TID BM  . midodrine  5 mg Oral TID WC  . pantoprazole (PROTONIX) IV  40 mg Intravenous Q24H  . sodium bicarbonate  650 mg Oral BID  . sodium chloride flush  3 mL Intravenous Q12H  . torsemide  20 mg Oral Daily   Anti-infectives (From admission, onward)   Start     Dose/Rate Route Frequency Ordered Stop   06/22/20 0911  sodium chloride 0.9 % with cefoTEtan (CEFOTAN) ADS Med       Note to Pharmacy: Georgena Spurling   : cabinet override      06/22/20 0911 06/22/20 1020   06/21/20 1115  cefoTEtan (CEFOTAN) 2 g in sodium chloride 0.9 % 100 mL IVPB        2 g 200 mL/hr over 30 Minutes Intravenous On call to O.R. 06/20/20 1346 06/22/20 0559   06/20/20 2200  amoxicillin (AMOXIL) capsule 500 mg  Status:  Discontinued        500 mg Oral Every 8 hours 06/20/20 1950 06/24/20 0725   06/14/20 1000  amoxicillin (AMOXIL) capsule 500 mg        500 mg Oral Every 12 hours 06/14/20 0741 06/19/20 0959   06/11/20 1030  cefTRIAXone (ROCEPHIN) 1 g in sodium chloride 0.9 %  100 mL IVPB  Status:  Discontinued        1 g 200 mL/hr over 30 Minutes Intravenous Every 24 hours 06/11/20 1021 06/14/20 0741   06/11/20 0300  cefTRIAXone (ROCEPHIN) 1 g in sodium chloride 0.9 % 100 mL IVPB        1 g 200 mL/hr over 30 Minutes Intravenous  Once 06/11/20 0259 06/11/20 0458       Assessment/Plan SIRS secondary to UTI/GI bleed Tachy-Brady syndrome - PTVP 11/2014 Severe MR s/p repair with 28 mm annuloplasty ring/oversew LAA, 2003 Hx NSVT/chronic atrial fibrillation-not on anticoagulation secondary to GI bleed Hx CHF Hx pulmonary hypertension Hx anemia Hx CKD with AKI Hx gastritis Hx recent balloon dilatation [name] Hx spinal stenosis Hx hypertension/hypertension-on midodrine at home Hydronephrosis with cystoscopy/left retrograde pyelogram/insertion of left double-J stent 06/11/2020;Dr.  Jeffie Pollock Hx remote tobacco use Malnutrition - prealbumin 11 (8/3)  Cecal Mass - adenocarcinoma - colonoscopy 06/18/20, Dr. Michail Sermon  >> path: adenocarcinoma - CEA 575 (8/3) -S/p lap right hemicolectomy 8/5 Dr Marlou Starks  FEN: IV fluids/clear liquids, Boost breeze ID: Rocephin 7/26-7/28; amoxicillin 7/29 >> day#9 DVT: SCD's, ok for chemical DVT prophylaxis from surgical standpoint Follow-up: TBD  Plan:Advance to soft diet, mobilize. Monitor wound.    LOS: 89 days    Clovis Riley, [facility] 06/24/2020, 9:07 AM Please see Amion for pager number during day hours 7:00am-4:30pm

## 2020-06-24 NOTE — Progress Notes (Addendum)
   06/24/20 1835  Assess: MEWS Score  Temp 97.8 F (36.6 C)  BP (!) 114/48  Pulse Rate 71  Resp (!) 26  Level of Consciousness Alert  SpO2 92 %  O2 Device Room Air  Assess: MEWS Score  MEWS Temp 0  MEWS Systolic 0  MEWS Pulse 0  MEWS RR 2  MEWS LOC 0  MEWS Score 2  MEWS Score Color Yellow  Assess: if the MEWS score is Yellow or Red  Were vital signs taken at a resting state? Yes  Focused Assessment Change from prior assessment (see assessment flowsheet)  Early Detection of Sepsis Score *See Row Information* Low  MEWS guidelines implemented *See Row Information* Yes  Take Vital Signs  Increase Vital Sign Frequency  Yellow: Q 2hr X 2 then Q 4hr X 2, if remains yellow, continue Q 4hrs  Escalate  MEWS: Escalate Yellow: discuss with charge nurse/RN and consider discussing with provider and RRT  Notify: Charge Nurse/RN  Name of Charge Nurse/RN Notified Rise Paganini, RN  Date Charge Nurse/RN Notified 06/24/20  Time Charge Nurse/RN Notified [place]  Notify: Provider  Provider Name/Title Dr. Grandville Silos  Date Provider Notified 06/24/20  Time Provider Notified [phone]  Notification Type Face-to-face  Notification Reason Change in status  Response No new orders  Date of Provider Response 06/24/20  Time of Provider Response 1841   Pt vomited after eating mashed potatoes for supper. Tachypnea and excessive coughing noted. Slight expiratory wheeze and rhonchi auscultated bilaterally. O2 saturations maintaining above 92% on RA.

## 2020-06-24 NOTE — Progress Notes (Signed)
PROGRESS NOTE    [name]  MHD:[id number] DOB: 04/10/1926 DOA: 06/11/2020 PCP: [name], MD   No chief complaint on file.   Brief Narrative:  86 year old lady with prior history of tachybradycardia syndrome s/p biventricular pacemaker placement in 2007, severe MR s/p repair, chronic atrial fibrillation not on anticoagulation secondary to GI bleed, abnormal esophageal motility consistent with presbyesophagus, gastritis on last endoscopy with balloon dilatation by [name], pulmonary hypertension presents with 2 days of dysuria, and abdominal pain, black stools for several weeks, and increased confusion admitted for sepsis secondary to urinary tract infection in the setting of obstructive uropathy and acute on chronic anemia.  CT of the abdomen and pelvis shows left-sided moderate hydronephrosis with intrarenal calculi bilaterally, left ureteral calculi and soft tissue fullness in the cecal area.  Patient underwent ureteral stent placement by urology on 06/11/2020.  And was started on IV Rocephin and admitted to [facility] service for further evaluation and management. Urine culture showed lactobacillus transition IV Rocephin to oral amoxicillin for another 2 weeks to complete the course. [name] with Urology suggested he will post pone the stent removal to a later date. She underwent CT abd showing cecal mass concerning for malignancy. She underwent colonoscopy on 06/18/20 by [name] showing large cecal mass concerning for malignancy. Gen surgery consulted for further management. Cecal mass pathology shows adenocarcinoma. Cardiology consulted for pre op clearance as she might need surgical intervention.  Oncology consulted who recommended CT chest for further staging.  Patient for surgery tomorrow 06/21/2020.   Assessment & Plan:   Principal Problem:   Acute lower UTI Active Problems:   Liver cirrhosis (HCC)   CKD (chronic kidney disease) stage 3, GFR 30-59 ml/min   Anemia    Biventricular cardiac pacemaker in situ   Permanent atrial fibrillation (HCC)   Chronic diastolic CHF (congestive heart failure) (HCC)   Gout   S/P MVR (mitral valve repair)   AKI (acute kidney injury) ([place])   Orthostatic hypotension dysautonomic syndrome (HCC)   GI bleeding   Obstructive uropathy   Sepsis secondary to UTI ([place])   Hydronephrosis of left kidney   Left ureteral stone   Advanced care planning/counseling discussion   Goals of care, counseling/discussion   Palliative care by specialist   DNR (do not resuscitate)   Acute on chronic renal insufficiency   Iron deficiency anemia due to chronic blood loss  1 SIRS/Sepsis secondary to UTI Patient on admission met criteria for systemic inflammatory response with, MAP < 65, AKI, urinalysis worrisome for UTI.  Urine cultures positive for lactobacillus.  Patient noted to have left-sided moderate hydronephrosis secondary to left ureteral stone and underwent left ureteral stent placement 06/11/2020.  Patient was on IV Rocephin which has subsequently been discontinued.  Amoxicillin started 06/20/2020 to complete a 14-day course of antibiotic treatment as patient with stent placement and left urethral stone.  Patient currently afebrile.  Follow.   2.  Obstructive uropathy with left-sided moderate hydronephrosis secondary to left ureteral stone Noted on CT abdomen and pelvis.  Patient seen in consultation by urology and underwent left ureteral stent placement 06/11/2020.  Foley catheter initially discontinued and placed back in perioperatively.  DC Foley catheter..  Urine cultures positive for lactobacillus.  Patient on IV Rocephin which is subsequently been discontinued.  Amoxicillin started 06/20/2020 to complete a 10 - 14-day course of antibiotic treatment.  Patient with good urine output.  Clinical improvement.  Per urology reschedule uteroscopy for about 2 to 3 weeks out  to give patient opportunity to recover from pending colon resection.  Urology  following and I appreciate the input and recommendations.  3.  Cecal adenocarcinoma Soft tissue fullness noted in cecal area on CT abdomen and pelvis.  Repeat CT abdomen and pelvis showing a lobulated mass in the cecum concerning for malignancy.  GI consulted and patient underwent colonoscopy with biopsy on 06/18/2020 per Dr. Michail Sermon with pathology positive for adenocarcinoma.  Patient seen in consultation by general surgery to see whether she is a candidate for diverting colostomy.  Patient seen in consultation by cardiology for preop clearance.  Patient currently not obstructed at this time.  CT chest with interval development of 10 mm subpleural rounded density seen posteriorly in the right lung apex.  Follow-up unenhanced chest CT in 3 months recommended to ensure stability or resolution.  Patient s/p laparoscopic right colectomy 06/22/2020.  Oncology consulted and are following and will evaluate postop to determine if further adjuvant treatment is feasible or warranted.  Palliative care also following.  Patient was tolerating a clear liquid diet and diet advanced to a soft diet.  Was called by RN this afternoon that patient had a bout of nausea and emesis.  Will place patient back on a clear liquid diet.  Check an acute abdominal series.  Appreciate oncology, general surgery, palliative care input and recommendations.  4.  Nausea and emesis Patient noted to have nausea and emesis later on this afternoon after being started on a soft diet.  Will place back on a clear liquid diet.  Check acute abdominal series.  Follow.  5.  Chronic combined systolic diastolic heart failure Patient appears compensated.  2D echo from May 2021 with a EF of 40%.  Patient denies any significant shortness of breath.  Patient received a dose of IV Lasix on 06/21/2020.  Demadex was resumed however will hold for now.  Follow.  6.  Chronic hypotension Midodrine.    7.  Permanent atrial fibrillation Rate controlled.  Patient  deemed not a anticoagulation candidate secondary to history of GI bleed.  Cardiology was following.   8.  Tachybradycardia syndrome with BiV PPM Stable.  Last interrogation April 2021 patient noted to be V pacing.  Per cardiology.  9.  Acute on chronic kidney disease stage IIIb Likely post renal azotemia in the setting of obstructive uropathy in the setting of sepsis and relative hypotension on admission.  Creatinine noted to have peaked at 3 and trending down.  Creatinine currently at 1.38.  Urine output 450 cc over the past 24 hours.  Patient received a dose of IV Lasix on 06/21/2020.  Demadex was resumed.  We will hold Demadex.  Monitor renal function.  Follow.   10.  Hypernatremia Likely secondary to free water deficit secondary to poor oral intake. Improved. Diuretics resumed.  Follow.   11.  Iron deficiency anemia secondary to colonic cancer bleed/anemia of chronic disease Status post recent EGD showing gastritis and presbyesophagus.  Baseline hemoglobin approximately 9.  On admission hemoglobin noted to be 7.9.  Status post 1 unit transfusion packed red blood cells hemoglobin currently at 7.7 and stable.  Monitor H&H postop.  Repeat H&H this afternoon.  Hematology/oncology recommending IV Feraheme 510 mg x 1 while in-house if okay with general surgery with resumption of iron sulfate 325 mg p.o. daily at time of discharge.  We will give IV Feraheme 510 mg x 1.  Transfusion threshold hemoglobin < 7.     DVT prophylaxis: SCDs.  History of GI  bleed. Code Status: DNR Family Communication: Updated daughter at bedside. Disposition:   Status is: Inpatient    Dispo: The patient is from: Assisted living facility              Anticipated d/c is to: SNF              Anticipated d/c date is: To be determined.              Patient currently being worked up for cecal cancer and s/p laparoscopic right colectomy 06/22/2020.  Oncology consultation following.  Not stable for discharge.          Consultants:   General surgery: Dr.Toth III 06/19/2020  Cardiology: Dr. Radford Pax 06/19/2020  Gastroenterology: Dr. Watt Climes 06/11/2020  Urology: Dr. Jeffie Pollock 06/11/2020  Oncology Dr. Lorenso Courier 06/20/2020  Palliative care: Dr. Rowe Pavy 06/20/2020  Procedures:  CT chest 06/28/2020  CT abdomen and pelvis 06/15/2020, 06/11/2020  Chest x-ray 06/11/2020  Colonoscopy 06/18/2020--per Dr. Michail Sermon gastroenterology  Cystoscopy with left retrograde pyelogram and interpretation/cystoscopy with insertion of left double-J stent per Dr.Wrenn 06/11/2020  Transfusion 1 unit packed red blood cells 06/11/2020  Laparoscopic right colectomy by Dr. Marlou Starks III 06/22/2020  Antimicrobials:  IV Rocephin 06/11/2020>>>> 06/14/2020  Amoxicillin 06/20/2020   Subjective: Patient earlier on in the room about to order a soft diet.  Denies any chest pain or shortness of breath.  Complaining of some lower abdominal discomfort.  States had a small bowel movement today.  Passing flatus.  Later on in the afternoon per RN patient noted to have nausea and emesis after soft diet.   Objective: Vitals:   06/23/20 1434 06/23/20 2032 06/24/20 0549 06/24/20 1130  BP: (!) 100/44 (!) 103/58 (!) 99/46 (!) 108/44  Pulse: 70 72 69 75  Resp: '20 16 16 '$ (!) 23  Temp: 97.6 F (36.4 C) 97.8 F (36.6 C) 97.6 F (36.4 C) 98.2 F (36.8 C)  TempSrc: Oral Oral    SpO2: 98% 98% 99% 100%  Weight:   93.8 kg   Height:        Intake/Output Summary (Last 24 hours) at 06/24/2020 1226 Last data filed at 06/24/2020 0926 Gross per 24 hour  Intake 960 ml  Output 150 ml  Net 810 ml   Filed Weights   06/22/20 0500 06/23/20 0526 06/24/20 0549  Weight: 90.1 kg 92.3 kg 93.8 kg    Examination:  General exam: NAD Respiratory system: Lungs clear to auscultation bilaterally.  No wheezes, no crackles, no rhonchi.  Normal respiratory effort.  No use of accessory muscles of respiration.  Speaking in full sentences.  Fair air movement.  Cardiovascular system: RRR no  murmurs rubs or gallops.  No JVD.  No lower extremity edema.  Gastrointestinal system: Abdomen is obese, soft, nondistended, some diffuse tenderness to palpation.  Dressing noted on mid abdominal region.  Positive bowel sounds. Central nervous system: Alert and oriented.  Moving extremities spontaneously.  Extremities: Symmetric 5 x 5 power. Skin: No rashes, lesions or ulcers Psychiatry: Judgement and insight appear normal. Mood & affect appropriate.     Data Reviewed: I have personally reviewed following labs and imaging studies  CBC: Recent Labs  Lab 06/20/20 0604 06/21/20 0518 06/22/20 0510 06/23/20 0600 06/24/20 0400  WBC 5.3 6.3 5.0 7.0 6.0  HGB 8.7* 8.6* 8.3* 8.4* 7.7*  HCT 30.8* 29.9* 28.2* 28.8* 26.9*  MCV 88.8 87.9 87.0 86.7 87.9  PLT 185 179 158 171 400    Basic Metabolic Panel: Recent Labs  Lab 06/20/20  3335 06/21/20 0518 06/22/20 0510 06/23/20 0600 06/24/20 0400  NA 141 136 139 134* 136  K 3.9 3.8 4.0 4.6 4.3  CL 114* 113* 114* 109 111  CO2 21* 18* 18* 17* 18*  GLUCOSE 86 84 86 84 98  BUN '13 12 12 11 16  '$ CREATININE 1.27* 1.24* 1.15* 1.18* 1.38*  CALCIUM 7.7* 7.3* 7.3* 7.3* 7.2*  MG  --  1.6* 2.5*  --  2.1    GFR: Estimated Creatinine Clearance: 28.3 mL/min (A) (by C-G formula based on SCr of 1.38 mg/dL (H)).  Liver Function Tests: Recent Labs  Lab 06/20/20 0604 06/21/20 0518  AST 16 15  ALT 12 12  ALKPHOS 87 77  BILITOT 0.7 0.7  PROT 4.9* 4.5*  ALBUMIN 2.2* 2.1*    CBG: No results for input(s): GLUCAP in the last 168 hours.   Recent Results (from the past 240 hour(s))  Surgical pcr screen     Status: None   Collection Time: 06/20/20 11:00 PM   Specimen: Nasal Mucosa; Nasal Swab  Result Value Ref Range Status   MRSA, PCR NEGATIVE NEGATIVE Final   Staphylococcus aureus NEGATIVE NEGATIVE Final    Comment: (NOTE) The Xpert SA Assay (FDA approved for NASAL specimens in patients 57 years of age and older), is one component of a  comprehensive surveillance program. It is not intended to diagnose infection nor to guide or monitor treatment. Performed at [facility], [place] [address] [place], [place] [address]          Radiology Studies: DG Chest Port 1 View  Result Date: 06/22/2020 CLINICAL DATA:  Central line placement EXAM: PORTABLE CHEST 1 VIEW COMPARISON:  06/12/2019 FINDINGS: 1 LEFT-sided pacemaker insert non wires overlies stable enlarged cardiac silhouette. Moderate LEFT effusion noted. Interval placement of a central venous line tip in distal SVC. No pneumothorax. IMPRESSION: 1. Central line placement without complication. 2. Stable LEFT pleural effusion. Electronically Signed   By: Suzy Bouchard M.D.   On: 06/22/2020 12:36        Scheduled Meds: . acetaminophen  1,000 mg Oral Q6H  . Chlorhexidine Gluconate Cloth  6 each Topical Daily  . feeding supplement  1 Container Oral TID BM  . midodrine  5 mg Oral TID WC  . pantoprazole  40 mg Oral Daily  . sodium bicarbonate  650 mg Oral BID  . sodium chloride flush  3 mL Intravenous Q12H  . torsemide  20 mg Oral Daily   Continuous Infusions:    LOS: 13 days    Time spent: 35 minutes    Irine Seal, MD [facility]   To contact the attending provider between 7A-7P or the covering provider during after hours 7P-7A, please log into the web site [email or web address] and access using universal [place] password for that web site. If you do not have the password, please call the hospital operator.  06/24/2020, 12:26 PM

## 2020-06-24 NOTE — Progress Notes (Signed)
Daily Progress Note   Patient Name: [name]       Date: 06/24/2020 DOB: 10-13-1926  Age: 86 y.o. MRN#: [id number] Attending Physician: Eugenie Filler, MD Primary Care Physician: [name], MD Admit Date: 06/11/2020  Reason for Consultation/Follow-up: Establishing goals of care  Subjective: Seen post surgery. She is progressing well. Tells me she really enjoyed her first food today.  She notes that plan is for her to go to SNF for rehab.   Length of Stay: 13  Current Medications: Scheduled Meds:  . acetaminophen  1,000 mg Oral Q6H  . Chlorhexidine Gluconate Cloth  6 each Topical Daily  . feeding supplement  1 Container Oral TID BM  . midodrine  5 mg Oral TID WC  . pantoprazole  40 mg Oral Daily  . sodium bicarbonate  650 mg Oral BID  . sodium chloride flush  3 mL Intravenous Q12H    Continuous Infusions: . ferumoxytol      PRN Meds: HYDROmorphone (DILAUDID) injection, metoprolol tartrate, ondansetron (ZOFRAN) IV, oxyCODONE, sodium chloride flush      Vital Signs: BP (!) 108/44 (BP Location: Right Arm)   Pulse 75   Temp 98.2 F (36.8 C)   Resp (!) 23   Ht 5\' 2"  (1.575 m)   Wt 93.8 kg   LMP 10/10/2013   SpO2 100%   BMI 37.82 kg/m  SpO2: SpO2: 100 % O2 Device: O2 Device: Room Air O2 Flow Rate: RA  General: in no acute distress Cardiac: extremities warm Pulm: breathing comfortably on room air Neuro: alert. No focal deficit. Remembers our discussion from yesterday. Skin: no rash on limited exam.  Intake/output summary:   Intake/Output Summary (Last 24 hours) at 06/24/2020 1526 Last data filed at 06/24/2020 2426 Gross per 24 hour  Intake 960 ml  Output 150 ml  Net 810 ml   LBM: Last BM Date: 06/23/20 Baseline Weight: Weight: 88 kg Most recent  weight: Weight: 93.8 kg       Palliative Assessment/Data: 40%      Patient Active Problem List   Diagnosis Date Noted  . Iron deficiency anemia due to chronic blood loss 06/21/2020  . Advanced care planning/counseling discussion   . Goals of care, counseling/discussion   . Palliative care by specialist   . DNR (do not resuscitate)   .  Acute on chronic renal insufficiency   . Sepsis secondary to UTI ([place]) 06/12/2020  . Hydronephrosis of left kidney 06/12/2020  . Left ureteral stone 06/12/2020  . GI bleeding 06/11/2020  . Obstructive uropathy 06/11/2020  . Syncope and collapse 04/04/2020  . Influenza A   . Bronchiectasis with acute exacerbation ([place])   . Respiratory distress 10/06/2017  . Orthostatic hypotension dysautonomic syndrome ([place])   . AKI (acute kidney injury) ([place]) 05/21/2016  . Cellulitis of leg, right 08/18/2015  . NSVT (nonsustained ventricular tachycardia) ([place])   . Gout flare 08/16/2015  . Abnormal thyroid function test 08/15/2015  . Right ankle pain 08/15/2015  . History of gout 08/15/2015  . Ankle pain   . S/P MVR (mitral valve repair) 01/05/2014  . Acute posthemorrhagic anemia 12/29/2013  . Encounter for therapeutic drug monitoring 12/12/2013  . Acute lower UTI 11/27/2013  . Gout 11/07/2013  . Orthostatic hypotension 10/31/2013  . Chronic diastolic CHF (congestive heart failure) ([place])   . Hypertension   . Mitral valve disorder   . Permanent atrial fibrillation ([place]) 10/28/2013  . Biventricular cardiac pacemaker in situ 10/14/2013  . Perirectal abscess 10/13/2013  . Chronic anticoagulation 10/10/2013  . Liver cirrhosis ([place]) 10/10/2013  . Thrombocytopenia ([place]) 10/10/2013  . Nausea and vomiting 10/10/2013  . CKD (chronic kidney disease) stage 3, GFR 30-59 ml/min 10/10/2013  . Anemia 10/10/2013    Palliative Care Assessment & Plan   Patient Profile: 54 yof admitted 06/11/20 for obstructive uropathy s/p stent placement by urology. Abdominal CT on  admission revealed a large partially obstructive mass in the cecum. Colonoscopy redemonstrated this fungating mass and biopsy was consistent with adenocarcinoma. General surgery was consulted and is planning for laproscopic right colectomy. Chest imaging has also revealed a subpleural nodule. Oncology has been consulted and will be following. Palliative care was consulted for discussion on goals of care.  Assessment/Recommendations/Plan   Continue current plan of care  Plan for SNF at discharge  Palliative medicine will continue to follow peripherally and monitor for any Palliative needs- will see patient as needed  Goals of Care and Additional Recommendations:  Limitations on Scope of Treatment: Full Scope Treatment  Code Status:  Full  Prognosis:   Unable to determine  Discharge Planning:  [place] for rehab with Palliative care service follow-up  Care plan was discussed with patient and her daughter, Santiago Glad.  Thank you for allowing the Palliative Medicine Team to assist in the care of this patient.   06/24/2020 3:26 PM      Time In: 1500 Time Out: 1515 Total Time 15 minutes Prolonged Time Billed No      Greater than 50%  of this time was spent counseling and coordinating care related to the above assessment and plan.  Mariana Kaufman, AGNP-C Palliative Medicine   Please contact Palliative Medicine Team phone at [phone] for questions and concerns.

## 2020-06-24 NOTE — Progress Notes (Signed)
Dr. Harlow Asa paged and made aware of previous episode of vomiting and change in diet/Abd and Chest Xray orders.

## 2020-06-25 ENCOUNTER — Inpatient Hospital Stay (HOSPITAL_COMMUNITY): Payer: Medicare Other

## 2020-06-25 ENCOUNTER — Telehealth: Payer: Self-pay | Admitting: Hematology and Oncology

## 2020-06-25 ENCOUNTER — Encounter (HOSPITAL_COMMUNITY): Payer: Self-pay | Admitting: General Surgery

## 2020-06-25 DIAGNOSIS — J690 Pneumonitis due to inhalation of food and vomit: Secondary | ICD-10-CM

## 2020-06-25 DIAGNOSIS — K567 Ileus, unspecified: Secondary | ICD-10-CM

## 2020-06-25 DIAGNOSIS — N390 Urinary tract infection, site not specified: Secondary | ICD-10-CM | POA: Diagnosis not present

## 2020-06-25 DIAGNOSIS — N201 Calculus of ureter: Secondary | ICD-10-CM | POA: Diagnosis not present

## 2020-06-25 DIAGNOSIS — Z950 Presence of cardiac pacemaker: Secondary | ICD-10-CM | POA: Diagnosis not present

## 2020-06-25 DIAGNOSIS — N289 Disorder of kidney and ureter, unspecified: Secondary | ICD-10-CM | POA: Diagnosis not present

## 2020-06-25 LAB — CBC
HCT: 28.6 % — ABNORMAL LOW (ref 36.0–46.0)
Hemoglobin: 8.3 g/dL — ABNORMAL LOW (ref 12.0–15.0)
MCH: 25.5 pg — ABNORMAL LOW (ref 26.0–34.0)
MCHC: 29 g/dL — ABNORMAL LOW (ref 30.0–36.0)
MCV: 88 fL (ref 80.0–100.0)
Platelets: 153 10*3/uL (ref 150–400)
RBC: 3.25 MIL/uL — ABNORMAL LOW (ref 3.87–5.11)
RDW: 18 % — ABNORMAL HIGH (ref 11.5–15.5)
WBC: 11.4 10*3/uL — ABNORMAL HIGH (ref 4.0–10.5)
nRBC: 0 % (ref 0.0–0.2)

## 2020-06-25 LAB — GLUCOSE, CAPILLARY: Glucose-Capillary: 77 mg/dL (ref 70–99)

## 2020-06-25 LAB — BASIC METABOLIC PANEL
Anion gap: 5 (ref 5–15)
BUN: 17 mg/dL (ref 8–23)
CO2: 20 mmol/L — ABNORMAL LOW (ref 22–32)
Calcium: 7.5 mg/dL — ABNORMAL LOW (ref 8.9–10.3)
Chloride: 112 mmol/L — ABNORMAL HIGH (ref 98–111)
Creatinine, Ser: 1.64 mg/dL — ABNORMAL HIGH (ref 0.44–1.00)
GFR calc Af Amer: 31 mL/min — ABNORMAL LOW (ref >60)
GFR calc non Af Amer: 27 mL/min — ABNORMAL LOW (ref >60)
Glucose, Bld: 109 mg/dL — ABNORMAL HIGH (ref 70–99)
Potassium: 4.1 mmol/L (ref 3.5–5.1)
Sodium: 137 mmol/L (ref 135–145)

## 2020-06-25 LAB — MAGNESIUM: Magnesium: 2 mg/dL (ref 1.7–2.4)

## 2020-06-25 MED ORDER — SODIUM CHLORIDE 0.9 % IV SOLN: INTRAVENOUS | Status: DC

## 2020-06-25 MED ORDER — DEXTROSE-NACL 5-0.9 % IV SOLN: INTRAVENOUS | Status: DC

## 2020-06-25 MED ORDER — ONDANSETRON HCL 4 MG/2ML IJ SOLN
4.0000 mg | INTRAMUSCULAR | Status: DC | PRN
  Administered 2020-06-25 – 2020-07-13 (×10): 4 mg via INTRAVENOUS
  Filled 2020-06-25 (×10): qty 2

## 2020-06-25 NOTE — Progress Notes (Signed)
Patient appeared to have episodes of disorientation. During am assessment pt had been A&Ox 4. This evening is Pt unable to express situation. Daughter is at bedside and reports pt being "confused" VS assessed CBG 77 and BP 94/44. V.O received to administer Dextrose 5%-0.9% at 75 ml/hr. Will continue to monitor.

## 2020-06-25 NOTE — Progress Notes (Signed)
3 Days Post-Op    CC:  Subjective: Soreness in upper incision, had some nausea and vomiting this am.  Denies flatus or BM's  Objective: Vital signs in last 24 hours: Temp:  [97.6 F (36.4 C)-98.2 F (36.8 C)] 98.1 F (36.7 C) (08/09 0617) Pulse Rate:  [69-75] 70 (08/09 0617) Resp:  [19-26] 20 (08/08 2222) BP: (100-114)/(42-48) 108/48 (08/09 0617) SpO2:  [92 %-100 %] 97 % (08/09 0617) Weight:  [93.2 kg] 93.2 kg (08/09 0617) Last BM Date: 06/23/20  Intake/Output from previous day: 08/08 0701 - 08/09 0700 In: 837 [P.O.:720; IV Piggyback:117] Out: 275 [Urine:275] Intake/Output this shift: No intake/output data recorded.  PE: Gen: Alert, NAD, pleasant HEENT: EOM's intact, pupils equal and round Card: RRR, diffuse edema Pulm: CTAB, no W/R/R, rate and effort normal Ext: calves soft and nontender Abd: Soft,mildly distended, tender along R hemiabdomen and superior aspect of incision where there is palpable swelling but no erythema, induration or warmth. Copious serous drainage from lower incision saturating dressing and reinforcements. All dressings removed. No cellulitis, but fair amount of serous drainage from incision. Wound packed and dressing applied. Skin: no rashes noted, warm and dry    Lab Results:  Recent Labs    06/24/20 1405 06/25/20 0353  WBC 6.4 11.4*  HGB 8.2* 8.3*  HCT 28.2* 28.6*  PLT 159 153    BMET Recent Labs    06/24/20 0400 06/25/20 0353  NA 136 137  K 4.3 4.1  CL 111 112*  CO2 18* 20*  GLUCOSE 98 109*  BUN 16 17  CREATININE 1.38* 1.64*  CALCIUM 7.2* 7.5*   PT/INR No results for input(s): LABPROT, INR in the last 72 hours.  Recent Labs  Lab 06/20/20 0604 06/21/20 0518  AST 16 15  ALT 12 12  ALKPHOS 87 77  BILITOT 0.7 0.7  PROT 4.9* 4.5*  ALBUMIN 2.2* 2.1*     Lipase     Component Value Date/Time   LIPASE 27 06/11/2020 0123     Medications:  acetaminophen  1,000 mg Oral Q6H   Chlorhexidine Gluconate Cloth  6 each  Topical Daily   feeding supplement  1 Container Oral TID BM   midodrine  5 mg Oral TID WC   pantoprazole  40 mg Oral Daily   sodium bicarbonate  650 mg Oral BID   sodium chloride flush  3 mL Intravenous Q12H   Anti-infectives (From admission, onward)   Start     Dose/Rate Route Frequency Ordered Stop   06/24/20 2200  piperacillin-tazobactam (ZOSYN) IVPB 3.375 g     Discontinue     3.375 g 12.5 mL/hr over 240 Minutes Intravenous Every 8 hours 06/24/20 2056     06/22/20 0911  sodium chloride 0.9 % with cefoTEtan (CEFOTAN) ADS Med       Note to Pharmacy: Georgena Spurling   : cabinet override      06/22/20 0911 06/22/20 1020   06/21/20 1115  cefoTEtan (CEFOTAN) 2 g in sodium chloride 0.9 % 100 mL IVPB        2 g 200 mL/hr over 30 Minutes Intravenous On call to O.R. 06/20/20 1346 06/22/20 0559   06/20/20 2200  amoxicillin (AMOXIL) capsule 500 mg  Status:  Discontinued        500 mg Oral Every 8 hours 06/20/20 1950 06/24/20 0725   06/14/20 1000  amoxicillin (AMOXIL) capsule 500 mg        500 mg Oral Every 12 hours 06/14/20 0741 06/19/20 0959  06/11/20 1030  cefTRIAXone (ROCEPHIN) 1 g in sodium chloride 0.9 % 100 mL IVPB  Status:  Discontinued        1 g 200 mL/hr over 30 Minutes Intravenous Every 24 hours 06/11/20 1021 06/14/20 0741   06/11/20 0300  cefTRIAXone (ROCEPHIN) 1 g in sodium chloride 0.9 % 100 mL IVPB        1 g 200 mL/hr over 30 Minutes Intravenous  Once 06/11/20 0259 06/11/20 0458       Assessment/Plan SIRS secondary to UTI/GI bleed Tachy-Brady syndrome - PTVP 11/2014 Severe MR s/p repair with 28 mm annuloplasty ring/oversew LAA, 2003 Hx NSVT/chronic atrial fibrillation-not on anticoagulation secondary to GI bleed Hx CHF Hx pulmonary hypertension Hx anemia Hx CKD with AKI Hx gastritis Hx recent balloon dilatation [name] Hx spinal stenosis Hx hypertension/hypertension-on midodrine at home Hydronephrosis with cystoscopy/left retrograde  pyelogram/insertion of left double-J stent 06/11/2020;Dr. Jeffie Pollock Hx remote tobacco use Malnutrition - prealbumin 11 (8/3)  Cecal Mass - adenocarcinoma - colonoscopy 06/18/20, Dr. Michail Sermon  >> path: adenocarcinoma - CEA 575 (8/3) -S/p lap right hemicolectomy 8/5 Dr Marlou Starks  FEN: IV fluids/NPO ID: Rocephin 7/26-7/28; amoxicillin 7/29 >> day#9 DVT: SCD's, ok for chemical DVT prophylaxis from surgical standpoint Follow-up: TBD  Plan:NPO until nausea better.  Recheck wbc in AM   LOS: 14 days    Rosario Adie, MD [facility] Surgery 06/25/2020, 8:54 AM Please see Amion for pager number during day hours 7:00am-4:30pm

## 2020-06-25 NOTE — Progress Notes (Signed)
Physical Therapy Treatment Patient Details Name: [name] MRN: [id number] DOB: Apr 29, 1926 Today's Date: 06/25/2020    History of Present Illness 86 yo female admitted with UTI SIRS. S/P cystoscopy, L double J stent placement 06/11/20. S/P R colectomy 06/22/20. Hx of Afib, CKD, anemia, CHF, NSVT, pacemaker, chronic back pain, spinal stenosis, orthostatic hypotension    PT Comments    Session limited 2* nausea/dry heaving. Pt was able to perform some bed level exercises on today. Deferred ambulation on today. Will resume OOB/ambulation, hopefully, next session.    Follow Up Recommendations  SNF     Equipment Recommendations  None recommended by PT    Recommendations for Other Services       Precautions / Restrictions Precautions Precautions: Fall Restrictions Weight Bearing Restrictions: No    Mobility  Bed Mobility               General bed mobility comments: Nt-unable on today 2* nausea, dry heaving.  Transfers                    Ambulation/Gait                 Stairs             Wheelchair Mobility    Modified Rankin (Stroke Patients Only)       Balance                                            Cognition Arousal/Alertness: Awake/alert Behavior During Therapy: WFL for tasks assessed/performed Overall Cognitive Status: Within Functional Limits for tasks assessed                                        Exercises General Exercises - Lower Extremity Ankle Circles/Pumps: AROM;Both;15 reps Quad Sets: AROM;Both;15 reps Hip ABduction/ADduction: AROM;Both;15 reps    General Comments        Pertinent Vitals/Pain Pain Assessment: Faces Faces Pain Scale: Hurts a little bit Pain Location: back, neck Pain Descriptors / Indicators: Discomfort;Sore Pain Intervention(s): Monitored during session;Limited activity within patient's tolerance    Home Living                      Prior  Function            PT Goals (current goals can now be found in the care plan section) Progress towards PT goals: Progressing toward goals    Frequency    Min 2X/week      PT Plan Current plan remains appropriate;Frequency needs to be updated    Co-evaluation              AM-PAC PT "6 Clicks" Mobility   Outcome Measure  Help needed turning from your back to your side while in a flat bed without using bedrails?: A Little Help needed moving from lying on your back to sitting on the side of a flat bed without using bedrails?: A Little Help needed moving to and from a bed to a chair (including a wheelchair)?: A Little Help needed standing up from a chair using your arms (e.g., wheelchair or bedside chair)?: A Little Help needed to walk in hospital room?: A Little Help needed climbing 3-5 steps with a railing? : A Little  6 Click Score: 18    End of Session   Activity Tolerance:  (limited by nausea) Patient left: in bed;with call bell/phone within reach;with bed alarm set   PT Visit Diagnosis: Difficulty in walking, not elsewhere classified (R26.2);Muscle weakness (generalized) (M62.81)     Time: 9426-2700 PT Time Calculation (min) (ACUTE ONLY): 16 min  Charges:  $Therapeutic Exercise: 8-22 mins                         Doreatha Massed, PT Acute Rehabilitation  Office: [phone] Pager: [phone]

## 2020-06-25 NOTE — Telephone Encounter (Signed)
Scheduled per 8/9 sch message. Spoke with pt's daughter and is aware of appt time and date.

## 2020-06-25 NOTE — Anesthesia Postprocedure Evaluation (Signed)
Anesthesia Post Note  Patient: [name]  Procedure(s) Performed: LAPAROSCOPIC RIGHT COLECTOMY (Right Abdomen)     Patient location during evaluation: PACU Anesthesia Type: General Level of consciousness: awake and alert Pain management: pain level controlled Vital Signs Assessment: post-procedure vital signs reviewed and stable Respiratory status: spontaneous breathing, nonlabored ventilation, respiratory function stable and patient connected to nasal cannula oxygen Cardiovascular status: blood pressure returned to baseline and stable Postop Assessment: no apparent nausea or vomiting Anesthetic complications: no   No complications documented.            [name]

## 2020-06-25 NOTE — Progress Notes (Signed)
PROGRESS NOTE    [name]  MWU:[id number] DOB: 1926-09-11 DOA: 06/11/2020 PCP: [name], MD   No chief complaint on file.   Brief Narrative:  86 year old lady with prior history of tachybradycardia syndrome s/p biventricular pacemaker placement in 2007, severe MR s/p repair, chronic atrial fibrillation not on anticoagulation secondary to GI bleed, abnormal esophageal motility consistent with presbyesophagus, gastritis on last endoscopy with balloon dilatation by [name], pulmonary hypertension presents with 2 days of dysuria, and abdominal pain, black stools for several weeks, and increased confusion admitted for sepsis secondary to urinary tract infection in the setting of obstructive uropathy and acute on chronic anemia.  CT of the abdomen and pelvis shows left-sided moderate hydronephrosis with intrarenal calculi bilaterally, left ureteral calculi and soft tissue fullness in the cecal area.  Patient underwent ureteral stent placement by urology on 06/11/2020.  And was started on IV Rocephin and admitted to [facility] service for further evaluation and management. Urine culture showed lactobacillus transition IV Rocephin to oral amoxicillin for another 2 weeks to complete the course. [name] with Urology suggested he will post pone the stent removal to a later date. She underwent CT abd showing cecal mass concerning for malignancy. She underwent colonoscopy on 06/18/20 by [name] showing large cecal mass concerning for malignancy. Gen surgery consulted for further management. Cecal mass pathology shows adenocarcinoma. Cardiology consulted for pre op clearance as she might need surgical intervention.  Oncology consulted who recommended CT chest for further staging.  Patient for surgery tomorrow 06/21/2020.   Assessment & Plan:   Principal Problem:   Acute lower UTI Active Problems:   Liver cirrhosis (HCC)   CKD (chronic kidney disease) stage 3, GFR 30-59 ml/min   Anemia    Biventricular cardiac pacemaker in situ   Permanent atrial fibrillation (HCC)   Chronic diastolic CHF (congestive heart failure) (HCC)   Gout   S/P MVR (mitral valve repair)   AKI (acute kidney injury) ([place])   Orthostatic hypotension dysautonomic syndrome (HCC)   GI bleeding   Obstructive uropathy   Sepsis secondary to UTI ([place])   Hydronephrosis of left kidney   Left ureteral stone   Advanced care planning/counseling discussion   Goals of care, counseling/discussion   Palliative care by specialist   DNR (do not resuscitate)   Acute on chronic renal insufficiency   Iron deficiency anemia due to chronic blood loss   Cancer of cecum (HCC)   Emesis   Gastritis with hemorrhage  1 SIRS/Sepsis secondary to UTI Patient on admission met criteria for systemic inflammatory response with, MAP < 65, AKI, urinalysis worrisome for UTI.  Urine cultures positive for lactobacillus.  Patient noted to have left-sided moderate hydronephrosis secondary to left ureteral stone and underwent left ureteral stent placement 06/11/2020.  Patient was on IV Rocephin which has subsequently been discontinued.  Amoxicillin started 06/20/2020 to complete a 14-day course of antibiotic treatment as patient with stent placement and left urethral stone.  Patient due to emesis and concern for possible aspiration pneumonia amoxicillin discontinued and patient placed on IV Zosyn.  Patient currently afebrile.  Follow.   2.  Obstructive uropathy with left-sided moderate hydronephrosis secondary to left ureteral stone Noted on CT abdomen and pelvis.  Patient seen in consultation by urology and underwent left ureteral stent placement 06/11/2020.  Foley catheter initially discontinued and placed back in perioperatively.  Foley catheter has been discontinued. Urine cultures positive for lactobacillus.  Patient on IV Rocephin which was subsequently discontinued.  Amoxicillin  started 06/20/2020 to complete a 10 - 14-day course of antibiotic  treatment.  Patient with good urine output.  Clinical improvement.  Per urology reschedule uteroscopy for about 2 to 3 weeks out to give patient opportunity to recover from pending colon resection.  Patient with bout of nausea and emesis 06/24/2020, concern for aspiration pneumonia and as such antibiotic were changed to IV Zosyn.  Once clinically improved we will transition back to oral amoxicillin to complete a 14-day course of antibiotic treatment from 06/20/2020.  Urology following and I appreciate the input and recommendations.  3.  Cecal adenocarcinoma Soft tissue fullness noted in cecal area on CT abdomen and pelvis.  Repeat CT abdomen and pelvis showing a lobulated mass in the cecum concerning for malignancy.  GI consulted and patient underwent colonoscopy with biopsy on 06/18/2020 per Dr. Bosie Clos with pathology positive for adenocarcinoma.  Patient seen in consultation by general surgery to see whether she is a candidate for diverting colostomy.  Patient seen in consultation by cardiology for preop clearance.  Patient currently not obstructed at this time.  CT chest with interval development of 10 mm subpleural rounded density seen posteriorly in the right lung apex.  Follow-up unenhanced chest CT in 3 months recommended to ensure stability or resolution.  Patient s/p laparoscopic right colectomy 06/22/2020.  Oncology consulted and are following and will evaluate postop to determine if further adjuvant treatment is feasible or warranted.  Palliative care also following.  Patient was tolerating a clear liquid diet and diet advanced to a soft diet.  Patient noted to have nausea and emesis the afternoon of 06/24/2020 and placed on a clear liquid diet.  Abdominal films obtained concerning for ileus versus SBO.  Diet has been downgraded to n.p.o./bowel rest per general surgery.  Appreciate oncology, general surgery, palliative care input and recommendations.  4.  Nausea and emesis/?  Ileus versus SBO Patient noted  to have nausea and emesis the afternoon of 06/24/2020 after being started on a soft diet.  Patient was on clears but now has been downgraded to n.p.o.  Abdominal films obtained concerning for ileus versus SBO bowel gas pattern.  Patient no bowel movement today.  Repeat abdominal films in the morning.  Supportive care.Follow.  5.??  Aspiration pneumonia Patient with bout of nausea and emesis on 06/24/2020.  Acute abdominal series done with left pleural effusion with left lower lobe atelectasis or infiltrate, cardiomegaly, increasing right basilar atelectasis or infiltrate.  Amoxicillin was discontinued patient on IV Zosyn which we will continue for another 24 hours and could likely de-escalate to IV Unasyn.  6.  Chronic combined systolic diastolic heart failure Patient appears compensated.  2D echo from May 2021 with a EF of 40%.  Patient denies any significant shortness of breath.  Patient received a dose of IV Lasix on 06/21/2020.  Demadex was resumed however discontinued due to nausea vomiting overnight and slight bump in renal function.  Follow.   7.  Chronic hypotension Continue midodrine.   8.  Permanent atrial fibrillation Rate controlled.  Patient deemed not a anticoagulation candidate secondary to history of GI bleed.  Cardiology was following.   9.  Tachybradycardia syndrome with BiV PPM Stable.  Last interrogation April 2021 patient noted to be V pacing.  Per cardiology.  10.  Acute on chronic kidney disease stage IIIb Likely post renal azotemia in the setting of obstructive uropathy in the setting of sepsis and relative hypotension on admission.  Creatinine noted to have peaked at 3 and  trending down.  Creatinine currently at 1.64.  Urine output 275 cc over the past 24 hours.  Patient received a dose of IV Lasix on 06/21/2020.  Demadex was resumed however held yesterday due to nausea and vomiting.  Creatinine with a slight bump however close to baseline.  Gentle hydration for the next 24 hours  as patient currently n.p.o.  Follow.  11.  Hypernatremia Likely secondary to free water deficit secondary to poor oral intake. Improved.  Diuretics on hold due to nausea and emesis yesterday.  Follow.   12.  Iron deficiency anemia secondary to colonic cancer bleed/anemia of chronic disease Status post recent EGD showing gastritis and presbyesophagus.  Baseline hemoglobin approximately 9.  On admission hemoglobin noted to be 7.9.  Status post 1 unit transfusion packed red blood cells hemoglobin currently at 8.3 and stable.  Monitor H&H postop.  Status post IV Feraheme 510 mg x 1( 06/24/2020).  Transfusion threshold hemoglobin < 7.     DVT prophylaxis: SCDs.  History of GI bleed. Code Status: DNR Family Communication: Updated daughter at bedside. Disposition:   Status is: Inpatient    Dispo: The patient is from: Assisted living facility              Anticipated d/c is to: SNF              Anticipated d/c date is: To be determined.              Patient currently being worked up for cecal cancer and s/p laparoscopic right colectomy 06/22/2020.  Patient with nausea and emesis overnight concerning for possible ileus versus obstruction currently n.p.o.  Oncology consultation following.  Not stable for discharge.        Consultants:   General surgery: Dr.Toth III 06/19/2020  Cardiology: Dr. Radford Pax 06/19/2020  Gastroenterology: Dr. Watt Climes 06/11/2020  Urology: Dr. Jeffie Pollock 06/11/2020  Oncology Dr. Lorenso Courier 06/20/2020  Palliative care: Dr. Rowe Pavy 06/20/2020  Procedures:  CT chest 06/28/2020  CT abdomen and pelvis 06/15/2020, 06/11/2020  Chest x-ray 06/11/2020  Colonoscopy 06/18/2020--per Dr. Michail Sermon gastroenterology  Cystoscopy with left retrograde pyelogram and interpretation/cystoscopy with insertion of left double-J stent per Dr.Wrenn 06/11/2020  Transfusion 1 unit packed red blood cells 06/11/2020  Laparoscopic right colectomy by Dr. Marlou Starks III 06/22/2020  Antimicrobials:  IV Rocephin  06/11/2020>>>> 06/14/2020  Amoxicillin 06/20/2020>>>>> 06/24/2020  IV Zosyn 06/24/2020   Subjective: Patient laying in bed.  Denies any emesis today.  Noted to have some emesis and nausea overnight.  Complain of some lower abdominal pressure.  No bowel movement.   Objective: Vitals:   06/24/20 2200 06/24/20 2222 06/25/20 0216 06/25/20 0617  BP:  (!) 105/47 (!) 100/42 (!) 108/48  Pulse:  69 70 70  Resp: 19 20    Temp:  97.6 F (36.4 C) 97.8 F (36.6 C) 98.1 F (36.7 C)  TempSrc:  Oral Oral Oral  SpO2:  97% 98% 97%  Weight:    93.2 kg  Height:        Intake/Output Summary (Last 24 hours) at 06/25/2020 1153 Last data filed at 06/24/2020 1837 Gross per 24 hour  Intake 357 ml  Output 275 ml  Net 82 ml   Filed Weights   06/23/20 0526 06/24/20 0549 06/25/20 0617  Weight: 92.3 kg 93.8 kg 93.2 kg    Examination:  General exam: No acute distress. Respiratory system: Some decreased breath sounds in the bases otherwise clear.  No wheezes, no crackles, no rhonchi.  Normal respiratory effort.  No  use of accessory muscles of respiration.  Cardiovascular system: Regular rate rhythm no murmurs rubs or gallops.  No JVD.  No lower extremity edema. Gastrointestinal system: Abdomen is obese, soft, some tenderness to palpation in the mid abdomen and lower abdomen.  Dressing noted on mid abdominal region.  Hypoactive bowel sounds.  No rebound.  No guarding.  Central nervous system: Alert and oriented.  Moving extremities spontaneously.  Extremities: Symmetric 5 x 5 power. Skin: No rashes, lesions or ulcers Psychiatry: Judgement and insight appear normal. Mood & affect appropriate.     Data Reviewed: I have personally reviewed following labs and imaging studies  CBC: Recent Labs  Lab 06/22/20 0510 06/23/20 0600 06/24/20 0400 06/24/20 1405 06/25/20 0353  WBC 5.0 7.0 6.0 6.4 11.4*  HGB 8.3* 8.4* 7.7* 8.2* 8.3*  HCT 28.2* 28.8* 26.9* 28.2* 28.6*  MCV 87.0 86.7 87.9 87.3 88.0  PLT 158 171  154 159 893    Basic Metabolic Panel: Recent Labs  Lab 06/21/20 0518 06/22/20 0510 06/23/20 0600 06/24/20 0400 06/25/20 0353  NA 136 139 134* 136 137  K 3.8 4.0 4.6 4.3 4.1  CL 113* 114* 109 111 112*  CO2 18* 18* 17* 18* 20*  GLUCOSE 84 86 84 98 109*  BUN '12 12 11 16 17  '$ CREATININE 1.24* 1.15* 1.18* 1.38* 1.64*  CALCIUM 7.3* 7.3* 7.3* 7.2* 7.5*  MG 1.6* 2.5*  --  2.1 2.0    GFR: Estimated Creatinine Clearance: 23.7 mL/min (A) (by C-G formula based on SCr of 1.64 mg/dL (H)).  Liver Function Tests: Recent Labs  Lab 06/20/20 0604 06/21/20 0518  AST 16 15  ALT 12 12  ALKPHOS 87 77  BILITOT 0.7 0.7  PROT 4.9* 4.5*  ALBUMIN 2.2* 2.1*    CBG: No results for input(s): GLUCAP in the last 168 hours.   Recent Results (from the past 240 hour(s))  Surgical pcr screen     Status: None   Collection Time: 06/20/20 11:00 PM   Specimen: Nasal Mucosa; Nasal Swab  Result Value Ref Range Status   MRSA, PCR NEGATIVE NEGATIVE Final   Staphylococcus aureus NEGATIVE NEGATIVE Final    Comment: (NOTE) The Xpert SA Assay (FDA approved for NASAL specimens in patients 60 years of age and older), is one component of a comprehensive surveillance program. It is not intended to diagnose infection nor to guide or monitor treatment. Performed at [facility], [place] [address], [place], [place] [address]          Radiology Studies: DG Abd 1 View  Result Date: 06/25/2020 CLINICAL DATA:  Emesis. EXAM: ABDOMEN - 1 VIEW COMPARISON:  Acute abdominal series 06/24/2020 FINDINGS: Left pleural effusion and cardiomegaly scratched at left greater than right pleural effusions are stable. Cardiomegaly is again noted. Left ureteral stent is in place. Focal dilated loop of small bowel in the right lower quadrant is increasing caliber. Bowel loops in the left abdomen have improved. No free air is evident. IMPRESSION: 1. Increasing caliber of small bowel loop in the right lower quadrant.  Question focal obstruction versus ileus. 2. Improving bowel gas pattern. 3. Stable left greater than right pleural effusions and cardiomegaly. Electronically Signed   By: San Morelle M.D.   On: 06/25/2020 06:49   DG ABD ACUTE 2+V W 1V CHEST  Result Date: 06/24/2020 CLINICAL DATA:  Vomiting EXAM: DG ABDOMEN ACUTE W/ 1V CHEST COMPARISON:  CT 06/15/2020 FINDINGS: Cardiomegaly. Left-sided pacer remains in place, unchanged. Left pleural effusion with left  base atelectasis or infiltrate. Increasing right basilar opacity. Dilated small bowel loops within the abdomen and pelvis. Gas within the stomach and mildly prominent large bowel. No free air. No organomegaly. Left ureteral stent is in place. IMPRESSION: Cardiomegaly. Left pleural effusion with left lower lobe atelectasis or infiltrate. Increasing right basilar atelectasis or infiltrate. Gas within mildly prominent small bowel loops and large bowel. Findings could reflect ileus. Bowel-gas pattern appears similar to prior CT. Electronically Signed   By: Rolm Baptise M.D.   On: 06/24/2020 19:57        Scheduled Meds: . acetaminophen  1,000 mg Oral Q6H  . Chlorhexidine Gluconate Cloth  6 each Topical Daily  . feeding supplement  1 Container Oral TID BM  . midodrine  5 mg Oral TID WC  . pantoprazole  40 mg Oral Daily  . sodium bicarbonate  650 mg Oral BID  . sodium chloride flush  3 mL Intravenous Q12H   Continuous Infusions: . sodium chloride 75 mL/hr at 06/25/20 0855  . piperacillin-tazobactam (ZOSYN)  IV 3.375 g (06/25/20 0554)     LOS: 14 days    Time spent: 35 minutes    Irine Seal, MD [facility]   To contact the attending provider between 7A-7P or the covering provider during after hours 7P-7A, please log into the web site [email or web address] and access using universal [place] password for that web site. If you do not have the password, please call the hospital operator.  06/25/2020, 11:53 AM

## 2020-06-25 NOTE — Progress Notes (Signed)
PT demonstrated hands on understanding of Flutter device at this time. PT has strong, non productive cough at this time.

## 2020-06-25 NOTE — Care Management Important Message (Signed)
Important Message  Patient Details IM Letter given to the Patient Name: [name] MRN: [id number] Date of Birth: 11-07-1926   Medicare Important Message Given:  Yes     Kerin Salen 06/25/2020, 10:19 AM

## 2020-06-26 ENCOUNTER — Inpatient Hospital Stay (HOSPITAL_COMMUNITY): Payer: Medicare Other

## 2020-06-26 DIAGNOSIS — N201 Calculus of ureter: Secondary | ICD-10-CM | POA: Diagnosis not present

## 2020-06-26 DIAGNOSIS — Z950 Presence of cardiac pacemaker: Secondary | ICD-10-CM | POA: Diagnosis not present

## 2020-06-26 DIAGNOSIS — N289 Disorder of kidney and ureter, unspecified: Secondary | ICD-10-CM | POA: Diagnosis not present

## 2020-06-26 DIAGNOSIS — N390 Urinary tract infection, site not specified: Secondary | ICD-10-CM | POA: Diagnosis not present

## 2020-06-26 LAB — CBC
HCT: 27.2 % — ABNORMAL LOW (ref 36.0–46.0)
Hemoglobin: 7.8 g/dL — ABNORMAL LOW (ref 12.0–15.0)
MCH: 25.2 pg — ABNORMAL LOW (ref 26.0–34.0)
MCHC: 28.7 g/dL — ABNORMAL LOW (ref 30.0–36.0)
MCV: 87.7 fL (ref 80.0–100.0)
Platelets: 142 10*3/uL — ABNORMAL LOW (ref 150–400)
RBC: 3.1 MIL/uL — ABNORMAL LOW (ref 3.87–5.11)
RDW: 18.1 % — ABNORMAL HIGH (ref 11.5–15.5)
WBC: 12.8 10*3/uL — ABNORMAL HIGH (ref 4.0–10.5)
nRBC: 0 % (ref 0.0–0.2)

## 2020-06-26 LAB — BASIC METABOLIC PANEL
Anion gap: 6 (ref 5–15)
BUN: 19 mg/dL (ref 8–23)
CO2: 20 mmol/L — ABNORMAL LOW (ref 22–32)
Calcium: 6.8 mg/dL — ABNORMAL LOW (ref 8.9–10.3)
Chloride: 106 mmol/L (ref 98–111)
Creatinine, Ser: 1.67 mg/dL — ABNORMAL HIGH (ref 0.44–1.00)
GFR calc Af Amer: 31 mL/min — ABNORMAL LOW (ref >60)
GFR calc non Af Amer: 26 mL/min — ABNORMAL LOW (ref >60)
Glucose, Bld: 88 mg/dL (ref 70–99)
Potassium: 4.2 mmol/L (ref 3.5–5.1)
Sodium: 132 mmol/L — ABNORMAL LOW (ref 135–145)

## 2020-06-26 LAB — SURGICAL PATHOLOGY

## 2020-06-26 MED ORDER — PROSOURCE PLUS PO LIQD
30.0000 mL | Freq: Two times a day (BID) | ORAL | Status: DC
  Administered 2020-06-28 – 2020-07-14 (×26): 30 mL via ORAL
  Filled 2020-06-26 (×29): qty 30

## 2020-06-26 MED ORDER — ADULT MULTIVITAMIN W/MINERALS CH
1.0000 | ORAL_TABLET | Freq: Every day | ORAL | Status: DC
  Administered 2020-06-27 – 2020-07-14 (×18): 1 via ORAL
  Filled 2020-06-26 (×18): qty 1

## 2020-06-26 NOTE — Progress Notes (Signed)
PHARMACY NOTE -  [place] has been assisting with dosing of Zosyn for aspiration PNA, UTI.  Dosage remains stable at 3.375 g IV q8 hr and Pharmacy may renally adjust per standing protocol  MD planning to narrow back to amoxicillin once clinically stable to complete 14d abx from 06/20/20  Pharmacy will sign off, following peripherally for culture results or dose adjustments. Please reconsult if a change in clinical status warrants re-evaluation of dosage.  Reuel Boom, PharmD, BCPS [phone] 06/26/2020, 12:47 PM

## 2020-06-26 NOTE — Progress Notes (Signed)
Nutrition Follow-up  DOCUMENTATION CODES:   Obesity unspecified  INTERVENTION:  - continue Boost Breeze TID, each supplement provides 250 kcal and 9 grams of protein. - will order 30 mL Prosource Plus BID, each supplement provides 100 kcal and 15 grams of protein. - will order 1 tablet multivitamin with minerals. - diet advancement as medically feasible.  NUTRITION DIAGNOSIS:   Inadequate oral intake related to altered GI function (newly diagnosed colon adenocarcimona pending right colectomy) as evidenced by  (NPO/CL > 5 days). -ongoing  GOAL:   Patient will meet greater than or equal to 90% of their needs -unmet  MONITOR:   PO intake, Supplement acceptance, Diet advancement, Weight trends, Labs, I & O's  ASSESSMENT:   86 year old female with past medical history of tachybradycardia syndrome s/p BiV pacemaker in 2007, severe MR s/p repair, chronic atrial fibrillation, history of GI bleed, abnormal esophageal motility consistent with presbyesophagus, gastritis with recent balloon dilation on July 20, HTN, and gout presented with 2 day history of urinary tract symptoms, right upper abdominal pain, and several week history of black stools admitted for sepsis secondary to UTI in the setting of obstructive uropathy and acute on chronic anemia.  Patient has been on CLD or has been NPO since admission on 8/2 other than when she was on a Soft diet on 8/8 from 0925 to Kevin. No intakes documented from during that time.   Diet was changed from CLD to NPO yesterday at 0900 and was re-advanced to CLD today at 1120; no intakes since that time.   Weight has been slowly trending up since admission on 7/26. Mild edema to BUE.   No nausea this AM but having ongoing abdominal pain/pressure. Surgery is following. Plan for CLD today and ambulation. Patient with ileus for patient who is POD #5 lap R hemicolectomy. Increased estimated nutrition needs d/t this.    Labs reviewed; Na: 132 mmol/l,  creatinine: 1.67 mg/dl, Ca: 6.8 mg/dl, GFR: 26 ml/min.  Medications reviewed; 40 mg oral protonix/day, 650 mg oral sodium bicarb BID.  IVF; D5-NS @ 20 ml/hr (82 kcal).    NUTRITION - FOCUSED PHYSICAL EXAM:  completed; no muscle or fat depletion, mild edema to BUE.   Diet Order:   Diet Order            Diet clear liquid Room service appropriate? Yes; Fluid consistency: Thin  Diet effective now                 EDUCATION NEEDS:   Not appropriate for education at this time  Skin:  Skin Assessment: Skin Integrity Issues: Skin Integrity Issues:: Other (Comment) Other: MASD; buttocks  Last BM:  8/7  Height:   Ht Readings from Last 1 Encounters:  06/11/20 5\' 2"  (1.575 m)    Weight:   Wt Readings from Last 1 Encounters:  06/26/20 93.4 kg    Estimated Nutritional Needs:  Kcal:  1750-1950 Protein:  98-108 Fluid:  >/= 1.7 L     Jarome Matin, MS, RD, LDN, CNSC Inpatient Clinical Dietitian RD pager # available in [facility]  After hours/weekend pager # available in [facility]

## 2020-06-26 NOTE — TOC Progression Note (Signed)
Transition of Care [facility]) - Progression Note    Patient Details  Name: [name] MRN: [id number] Date of Birth: 1926-07-26  Transition of Care [facility]) CM/SW Contact  Tahtiana Rozier, Juliann Pulse, RN Phone Number: 06/26/2020, 11:46 AM  Clinical Narrative: Noted ileus,POD#4 Colectomy.sx following. Palliative care following [facility] palliative care services @ [facility] chosen.      Expected Discharge Plan: [place] Barriers to Discharge: Continued Medical Work up  Expected Discharge Plan and Services Expected Discharge Plan: [place]   Discharge Planning Services: CM Consult Post Acute Care Choice: [facility] Living arrangements for the past 2 months: [place]                                       Social Determinants of Health (SDOH) Interventions    Readmission Risk Interventions No flowsheet data found.

## 2020-06-26 NOTE — Progress Notes (Signed)
Occupational Therapy Progress Note  Patient min A to roll and mod A to elevate trunk from sidelying with mod A to scoot L hip towards edge of bed. Patient requiring increased assistance for bed mobility since colectomy 8/6 compared to previous sessions with this OT. Patient min A with cues for body mechanics/hand placement to take few steps with walker to bedside commode. Max A to perform peri care after bowel movement, set up A for hand hygiene in sitting and min A to transfer to recliner with decreased carry over of verbal cues for hand placement with sitting.    06/26/20 1400  OT Visit Information  Last OT Received On 06/26/20  Assistance Needed +1  History of Present Illness 86 yo female admitted with UTI SIRS. S/P cystoscopy, L double J stent placement 06/11/20. S/P R colectomy 06/22/20. Hx of Afib, CKD, anemia, CHF, NSVT, pacemaker, chronic back pain, spinal stenosis, orthostatic hypotension  Precautions  Precautions Fall  Precaution Comments R colectomy 8/6  Pain Assessment  Pain Assessment Faces  Faces Pain Scale 2  Pain Location back, neck  Pain Descriptors / Indicators Discomfort;Sore  Pain Intervention(s) Monitored during session;Repositioned  Cognition  Arousal/Alertness Awake/alert  Behavior During Therapy WFL for tasks assessed/performed  Overall Cognitive Status Within Functional Limits for tasks assessed  General Comments follows 1 step directions  ADL  Overall ADL's  Needs assistance/impaired  Grooming Wash/dry hands;Set up;Sitting  Toilet Transfer Minimal assistance;Cueing for safety;Ambulation;BSC;RW  Toilet Transfer Details (indicate cue type and reason) patient able to take few steps to bedside commode, cues for safety with hand placement  Toileting- Clothing Manipulation and Hygiene Maximal assistance;Sit to/from stand  Toileting - Clothing Manipulation Details (indicate cue type and reason) patient attempts peri care after bowel movement from the front, cue patient  to be careful with performing hygiene this way, pt ended up requiring max A to thoroughly clean buttock   Functional mobility during ADLs Minimal assistance;Rolling walker;Cueing for safety  Bed Mobility  Overal bed mobility Needs Assistance  Bed Mobility Rolling;Sidelying to Sit  Rolling Min assist  Sidelying to sit Mod assist;HOB elevated  General bed mobility comments cues for log roll due to recent abd sx, mod A to elevate trunk to sitting, mod A to scoot L buttock to EOB  Balance  Overall balance assessment Needs assistance  Sitting-balance support Feet supported  Sitting balance-Leahy Scale Good  Standing balance support Bilateral upper extremity supported  Standing balance-Leahy Scale Poor  Standing balance comment reliant on external support  Restrictions  Weight Bearing Restrictions No  Transfers  Overall transfer level Needs assistance  Equipment used Rolling walker (2 wheeled)  Transfers Sit to/from Stand  Sit to Stand Min assist  General transfer comment VC for hand placement and safety, min A to power up to standing  OT - End of Session  Equipment Utilized During Treatment Rolling walker  Activity Tolerance Patient tolerated treatment well  Patient left in chair;with call bell/phone within reach;with chair alarm set  Nurse Communication Mobility status;Other (comment) (bowel movement and dressing leaking)  OT Assessment/Plan  OT Plan Discharge plan remains appropriate  OT Visit Diagnosis Other abnormalities of gait and mobility (R26.89);Other symptoms and signs involving cognitive function  OT Frequency (ACUTE ONLY) Min 2X/week  Follow Up Recommendations SNF  OT Equipment None recommended by OT  AM-PAC OT "6 Clicks" Daily Activity Outcome Measure (Version 2)  Help from another person eating meals? 3  Help from another person taking care of personal grooming? 3  Help  from another person toileting, which includes using toliet, bedpan, or urinal? 2  Help from another  person bathing (including washing, rinsing, drying)? 3  Help from another person to put on and taking off regular upper body clothing? 3  Help from another person to put on and taking off regular lower body clothing? 3  6 Click Score 17  OT Goal Progression  Progress towards OT goals Goals drowngraded-see care plan  Acute Rehab OT Goals  Patient Stated Goal agreeable to sit up in chair  OT Goal Formulation With patient  Time For Goal Achievement 07/10/20  Potential to Achieve Goals Good  ADL Goals  Pt Will Perform Grooming standing;with supervision  Pt Will Perform Upper Body Dressing sitting;with supervision  Pt Will Perform Lower Body Dressing sit to/from stand;sitting/lateral leans;with supervision  Pt Will Transfer to Toilet ambulating;with supervision  Additional ADL Goal #1 patient will demonstrate safe body mechanics during functional transfers in 2/3 trials with no verbal cues to minimize risk of falls,  OT Time Calculation  OT Start Time (ACUTE ONLY) 1241  OT Stop Time (ACUTE ONLY) 1305  OT Time Calculation (min) 24 min  OT General Charges  $OT Visit 1 Visit  OT Treatments  $Self Care/Home Management  23-37 mins   Delbert Phenix OT OT pager: [phone]

## 2020-06-26 NOTE — Progress Notes (Signed)
PROGRESS NOTE    [name]  MWU:[id number] DOB: 1926-09-11 DOA: 06/11/2020 PCP: [name], MD   No chief complaint on file.   Brief Narrative:  86 year old lady with prior history of tachybradycardia syndrome s/p biventricular pacemaker placement in 2007, severe MR s/p repair, chronic atrial fibrillation not on anticoagulation secondary to GI bleed, abnormal esophageal motility consistent with presbyesophagus, gastritis on last endoscopy with balloon dilatation by [name], pulmonary hypertension presents with 2 days of dysuria, and abdominal pain, black stools for several weeks, and increased confusion admitted for sepsis secondary to urinary tract infection in the setting of obstructive uropathy and acute on chronic anemia.  CT of the abdomen and pelvis shows left-sided moderate hydronephrosis with intrarenal calculi bilaterally, left ureteral calculi and soft tissue fullness in the cecal area.  Patient underwent ureteral stent placement by urology on 06/11/2020.  And was started on IV Rocephin and admitted to [facility] service for further evaluation and management. Urine culture showed lactobacillus transition IV Rocephin to oral amoxicillin for another 2 weeks to complete the course. [name] with Urology suggested he will post pone the stent removal to a later date. She underwent CT abd showing cecal mass concerning for malignancy. She underwent colonoscopy on 06/18/20 by [name] showing large cecal mass concerning for malignancy. Gen surgery consulted for further management. Cecal mass pathology shows adenocarcinoma. Cardiology consulted for pre op clearance as she might need surgical intervention.  Oncology consulted who recommended CT chest for further staging.  Patient for surgery tomorrow 06/21/2020.   Assessment & Plan:   Principal Problem:   Acute lower UTI Active Problems:   Liver cirrhosis (HCC)   CKD (chronic kidney disease) stage 3, GFR 30-59 ml/min   Anemia    Biventricular cardiac pacemaker in situ   Permanent atrial fibrillation (HCC)   Chronic diastolic CHF (congestive heart failure) (HCC)   Gout   S/P MVR (mitral valve repair)   AKI (acute kidney injury) ([place])   Orthostatic hypotension dysautonomic syndrome (HCC)   GI bleeding   Obstructive uropathy   Sepsis secondary to UTI ([place])   Hydronephrosis of left kidney   Left ureteral stone   Advanced care planning/counseling discussion   Goals of care, counseling/discussion   Palliative care by specialist   DNR (do not resuscitate)   Acute on chronic renal insufficiency   Iron deficiency anemia due to chronic blood loss   Cancer of cecum (HCC)   Emesis   Gastritis with hemorrhage  1 SIRS/Sepsis secondary to UTI Patient on admission met criteria for systemic inflammatory response with, MAP < 65, AKI, urinalysis worrisome for UTI.  Urine cultures positive for lactobacillus.  Patient noted to have left-sided moderate hydronephrosis secondary to left ureteral stone and underwent left ureteral stent placement 06/11/2020.  Patient was on IV Rocephin which has subsequently been discontinued.  Amoxicillin started 06/20/2020 to complete a 14-day course of antibiotic treatment as patient with stent placement and left urethral stone.  Patient due to emesis and concern for possible aspiration pneumonia amoxicillin discontinued and patient placed on IV Zosyn.  Patient currently afebrile.  Follow.   2.  Obstructive uropathy with left-sided moderate hydronephrosis secondary to left ureteral stone Noted on CT abdomen and pelvis.  Patient seen in consultation by urology and underwent left ureteral stent placement 06/11/2020.  Foley catheter initially discontinued and placed back in perioperatively.  Foley catheter has been discontinued. Urine cultures positive for lactobacillus.  Patient on IV Rocephin which was subsequently discontinued.  Amoxicillin  started 06/20/2020 to complete a 10 - 14-day course of antibiotic  treatment.  Patient with good urine output.  Clinical improvement.  Per urology reschedule uteroscopy for about 2 to 3 weeks out to give patient opportunity to recover from pending colon resection.  Patient with bout of nausea and emesis 06/24/2020, concern for aspiration pneumonia and as such antibiotic were changed to IV Zosyn.  Once clinically improved and on a diet, we will transition back to oral amoxicillin to complete a 14-day course of antibiotic treatment from 06/20/2020.  Urology following and I appreciate the input and recommendations.  3.  Cecal adenocarcinoma Soft tissue fullness noted in cecal area on CT abdomen and pelvis.  Repeat CT abdomen and pelvis showing a lobulated mass in the cecum concerning for malignancy.  GI consulted and patient underwent colonoscopy with biopsy on 06/18/2020 per Dr. Michail Sermon with pathology positive for adenocarcinoma.  Patient seen in consultation by general surgery to see whether she is a candidate for diverting colostomy.  Patient seen in consultation by cardiology for preop clearance.  Patient currently not obstructed at this time.  CT chest with interval development of 10 mm subpleural rounded density seen posteriorly in the right lung apex.  Follow-up unenhanced chest CT in 3 months recommended to ensure stability or resolution.  Patient s/p laparoscopic right colectomy 06/22/2020.  Oncology consulted and are following and will evaluate postop to determine if further adjuvant treatment is feasible or warranted.  Palliative care also following.  Patient was tolerating a clear liquid diet and diet advanced to a soft diet.  Patient noted to have nausea and emesis the afternoon of 06/24/2020 and placed on a clear liquid diet.  Abdominal films obtained concerning for ileus versus SBO.  Diet was downgraded to n.p.o. per general surgery and patient advanced to clears today per general surgery.  Appreciate oncology, general surgery, palliative care input and recommendations.  4.   Nausea and emesis/?  Ileus versus SBO Patient noted to have nausea and emesis the afternoon of 06/24/2020 after being started on a soft diet.  Patient was on clears but now has been downgraded to n.p.o.  Abdominal films obtained concerning for ileus versus SBO bowel gas pattern.  Patient no bowel movement today.  Repeat abdominal films this morning consistent with ileus.  Patient was n.p.o. and currently on a trial of clears per general surgery.  Supportive care.   5.??  Aspiration pneumonia Patient with bout of nausea and emesis on 06/24/2020.  Acute abdominal series done with left pleural effusion with left lower lobe atelectasis or infiltrate, cardiomegaly, increasing right basilar atelectasis or infiltrate.  Amoxicillin was discontinued patient on IV Zosyn.  If leukocytosis improves, remains afebrile, could likely de-escalate to IV Unasyn.  Follow.  6.  Chronic combined systolic diastolic heart failure Patient appears compensated.  2D echo from May 2021 with a EF of 40%.  Patient denies any significant shortness of breath.  Patient received a dose of IV Lasix on 06/21/2020.  Demadex was resumed however discontinued due to nausea vomiting and slight bump in renal function.  Follow.   7.  Chronic hypotension Midodrine.    8.  Permanent atrial fibrillation Trolled.  Patient not a anticoagulation candidate secondary to history of GI bleed.  Cardiology was following however have signed off.   9.  Tachybradycardia syndrome with BiV PPM Stable.  Last interrogation April 2021 patient noted to be V pacing.  Per cardiology.  10.  Acute on chronic kidney disease stage IIIb Likely  post renal azotemia in the setting of obstructive uropathy in the setting of sepsis and relative hypotension on admission.  Creatinine noted to have peaked at 3 and trending down.  Creatinine currently at 1.67.  Urine output 225 cc over the past 24 hours.  Patient received a dose of IV Lasix on 06/21/2020.  Demadex was resumed however  held due to nausea and vomiting.  Creatinine with a slight bump however close to baseline.  Decrease IV fluids to 20 cc/h.  Follow.   11.  Hypernatremia Likely secondary to free water deficit secondary to poor oral intake. Improved.  Diuretics on hold due to nausea and emesis.  Follow.   12.  Iron deficiency anemia secondary to colonic cancer bleed/anemia of chronic disease Status post recent EGD showing gastritis and presbyesophagus.  Baseline hemoglobin approximately 9.  On admission hemoglobin noted to be 7.9.  Status post 1 unit transfusion packed red blood cells hemoglobin currently at 7.8 and stable.  Monitor H&H postop.  Status post IV Feraheme 510 mg x 1( 06/24/2020).  Transfusion threshold hemoglobin < 7.     DVT prophylaxis: SCDs.  History of GI bleed. Code Status: DNR Family Communication: Updated daughter at bedside. Disposition:   Status is: Inpatient    Dispo: The patient is from: Assisted living facility              Anticipated d/c is to: SNF              Anticipated d/c date is: To be determined.              Patient currently being worked up for cecal cancer and s/p laparoscopic right colectomy 06/22/2020.  Patient now with an ileus.  Currently n.p.o.  General surgery following.  Oncology consulted.  Not stable for discharge.        Consultants:   General surgery: Dr.Toth III 06/19/2020  Cardiology: Dr. Radford Pax 06/19/2020  Gastroenterology: Dr. Watt Climes 06/11/2020  Urology: Dr. Jeffie Pollock 06/11/2020  Oncology Dr. Lorenso Courier 06/20/2020  Palliative care: Dr. Rowe Pavy 06/20/2020  Procedures:  CT chest 06/28/2020  CT abdomen and pelvis 06/15/2020, 06/11/2020  Chest x-ray 06/11/2020  Colonoscopy 06/18/2020--per Dr. Michail Sermon gastroenterology  Cystoscopy with left retrograde pyelogram and interpretation/cystoscopy with insertion of left double-J stent per Dr.Wrenn 06/11/2020  Transfusion 1 unit packed red blood cells 06/11/2020  Laparoscopic right colectomy by Dr. Marlou Starks III  06/22/2020  Antimicrobials:  IV Rocephin 06/11/2020>>>> 06/14/2020  Amoxicillin 06/20/2020>>>>> 06/24/2020  IV Zosyn 06/24/2020   Subjective: Patient laying in bed.  Denies chest pain no shortness of breath.  No further nausea or emesis.  Denies any bowel movement today.  Not sure whether she is having some flatus.    Objective: Vitals:   06/25/20 1412 06/25/20 2119 06/26/20 0500 06/26/20 0548  BP: 97/62 (!) 118/43  (!) 93/51  Pulse: 70 69  69  Resp:    16  Temp: 97.6 F (36.4 C) 98 F (36.7 C)  97.8 F (36.6 C)  TempSrc: Oral Axillary  Oral  SpO2: 98% 98%  97%  Weight:   93.4 kg   Height:        Intake/Output Summary (Last 24 hours) at 06/26/2020 1256 Last data filed at 06/26/2020 1000 Gross per 24 hour  Intake 1072.35 ml  Output 225 ml  Net 847.35 ml   Filed Weights   06/24/20 0549 06/25/20 0617 06/26/20 0500  Weight: 93.8 kg 93.2 kg 93.4 kg    Examination:  General exam: NAD. Respiratory system: CTAB  anterior lung fields.  No wheezes, no crackles, no rhonchi.  Normal respiratory effort.  No use of accessory muscles of respiration. Cardiovascular system: RRR no murmurs rubs or gallops.  No JVD.  No lower extremity edema.  Gastrointestinal system: Abdomen is soft, obese, some tenderness to palpation mid abdomen and lower abdomen.  Dressing on mid abdominal region.  Hypoactive bowel sounds.  No rebound.  No guarding. Central nervous system: Alert and oriented.  Moving extremities spontaneously.  Extremities: Symmetric 5 x 5 power. Skin: No rashes, lesions or ulcers Psychiatry: Judgement and insight appear normal. Mood & affect appropriate.     Data Reviewed: I have personally reviewed following labs and imaging studies  CBC: Recent Labs  Lab 06/23/20 0600 06/24/20 0400 06/24/20 1405 06/25/20 0353 06/26/20 0431  WBC 7.0 6.0 6.4 11.4* 12.8*  HGB 8.4* 7.7* 8.2* 8.3* 7.8*  HCT 28.8* 26.9* 28.2* 28.6* 27.2*  MCV 86.7 87.9 87.3 88.0 87.7  PLT 171 154 159 153 142*     Basic Metabolic Panel: Recent Labs  Lab 06/21/20 0518 06/21/20 0518 06/22/20 0510 06/23/20 0600 06/24/20 0400 06/25/20 0353 06/26/20 0431  NA 136   < > 139 134* 136 137 132*  K 3.8   < > 4.0 4.6 4.3 4.1 4.2  CL 113*   < > 114* 109 111 112* 106  CO2 18*   < > 18* 17* 18* 20* 20*  GLUCOSE 84   < > 86 84 98 109* 88  BUN 12   < > _0 CREATININE 1.24*   < > 1.15* 1.18* 1.38* 1.64* 1.67*  CALCIUM 7.3*   < > 7.3* 7.3* 7.2* 7.5* 6.8*  MG 1.6*  --  2.5*  --  2.1 2.0  --    < > = values in this interval not displayed.    GFR: Estimated Creatinine Clearance: 23.3 mL/min (A) (by C-G formula based on SCr of 1.67 mg/dL (H)).  Liver Function Tests: Recent Labs  Lab 06/20/20 0604 06/21/20 0518  AST 16 15  ALT 12 12  ALKPHOS 87 77  BILITOT 0.7 0.7  PROT 4.9* 4.5*  ALBUMIN 2.2* 2.1*    CBG: Recent Labs  Lab 06/25/20 1816  GLUCAP 77     Recent Results (from the past 240 hour(s))  Surgical pcr screen     Status: None   Collection Time: 06/20/20 11:00 PM   Specimen: Nasal Mucosa; Nasal Swab  Result Value Ref Range Status   MRSA, PCR NEGATIVE NEGATIVE Final   Staphylococcus aureus NEGATIVE NEGATIVE Final    Comment: (NOTE) The Xpert SA Assay (FDA approved for NASAL specimens in patients 20 years of age and older), is one component of a comprehensive surveillance program. It is not intended to diagnose infection nor to guide or monitor treatment. Performed at [facility], [place] [address], [place], [place] [address]          Radiology Studies: DG Abd 1 View  Result Date: 06/26/2020 CLINICAL DATA:  Abdominal pain EXAM: ABDOMEN - 1 VIEW COMPARISON:  June 25, 2020. FINDINGS: Double-J stent on the left is unchanged in position. Loops of dilated bowel are similar to 1 day prior. Surgical clips are noted in the right upper quadrant. There is calcification in the splenic artery. No appreciable free air on supine examination. No skin staples  to the left of midline. IMPRESSION: Loops of dilated bowel, similar to 1 day prior. Suspect postoperative ileus. A degree of bowel obstruction  cannot be excluded. Double-J stent unchanged in position on the left. Clips right upper quadrant noted. Electronically Signed   By: Lowella Grip III M.D.   On: 06/26/2020 09:10   DG Abd 1 View  Result Date: 06/25/2020 CLINICAL DATA:  Emesis. EXAM: ABDOMEN - 1 VIEW COMPARISON:  Acute abdominal series 06/24/2020 FINDINGS: Left pleural effusion and cardiomegaly scratched at left greater than right pleural effusions are stable. Cardiomegaly is again noted. Left ureteral stent is in place. Focal dilated loop of small bowel in the right lower quadrant is increasing caliber. Bowel loops in the left abdomen have improved. No free air is evident. IMPRESSION: 1. Increasing caliber of small bowel loop in the right lower quadrant. Question focal obstruction versus ileus. 2. Improving bowel gas pattern. 3. Stable left greater than right pleural effusions and cardiomegaly. Electronically Signed   By: San Morelle M.D.   On: 06/25/2020 06:49   DG ABD ACUTE 2+V W 1V CHEST  Result Date: 06/24/2020 CLINICAL DATA:  Vomiting EXAM: DG ABDOMEN ACUTE W/ 1V CHEST COMPARISON:  CT 06/15/2020 FINDINGS: Cardiomegaly. Left-sided pacer remains in place, unchanged. Left pleural effusion with left base atelectasis or infiltrate. Increasing right basilar opacity. Dilated small bowel loops within the abdomen and pelvis. Gas within the stomach and mildly prominent large bowel. No free air. No organomegaly. Left ureteral stent is in place. IMPRESSION: Cardiomegaly. Left pleural effusion with left lower lobe atelectasis or infiltrate. Increasing right basilar atelectasis or infiltrate. Gas within mildly prominent small bowel loops and large bowel. Findings could reflect ileus. Bowel-gas pattern appears similar to prior CT. Electronically Signed   By: Rolm Baptise M.D.   On: 06/24/2020 19:57         Scheduled Meds: . (feeding supplement) PROSource Plus  30 mL Oral BID BM  . acetaminophen  1,000 mg Oral Q6H  . Chlorhexidine Gluconate Cloth  6 each Topical Daily  . feeding supplement  1 Container Oral TID BM  . midodrine  5 mg Oral TID WC  . multivitamin with minerals  1 tablet Oral Daily  . pantoprazole  40 mg Oral Daily  . sodium bicarbonate  650 mg Oral BID  . sodium chloride flush  3 mL Intravenous Q12H   Continuous Infusions: . dextrose 5 % and 0.9% NaCl 20 mL/hr at 06/26/20 1120  . piperacillin-tazobactam (ZOSYN)  IV Stopped (06/26/20 0930)     LOS: 15 days    Time spent: 35 minutes    Irine Seal, MD [facility]   To contact the attending provider between 7A-7P or the covering provider during after hours 7P-7A, please log into the web site [email or web address] and access using universal [place] password for that web site. If you do not have the password, please call the hospital operator.  06/26/2020, 12:56 PM

## 2020-06-26 NOTE — Progress Notes (Signed)
4 Days Post-Op    CC:86 y/o with abdominal pain, black stools, worsening pallor and confusion  Subjective: Patient is lying in bed awake in place.  She is not aware of being out of bed to the chair or ambulating.  She did say the chair was uncomfortable so she has been up at least once. No complaints of nausea or vomiting.  She complains of generalized abdominal discomfort.  She is not aware of any flatus or BM.  Objective: Vital signs in last 24 hours: Temp:  [97.6 F (36.4 C)-98 F (36.7 C)] 97.8 F (36.6 C) (08/10 0548) Pulse Rate:  [69-70] 69 (08/10 0548) Resp:  [16] 16 (08/10 0548) BP: (93-118)/(43-62) 93/51 (08/10 0548) SpO2:  [97 %-98 %] 97 % (08/10 0548) Weight:  [93.4 kg] 93.4 kg (08/10 0500) Last BM Date: 06/23/20 1028 IV Nothing p.o. recorded BM x1 recorded Afebrile vital signs are stable BP 297-98 systolic range. NA 132, CO2 20, creatinine 1.67, calcium 6.8 WBC 12.8 H/H 7.8/27.2, platelets 142,000. Plain film this a.m. shows small & colon distention Intake/Output from previous day: 08/09 0701 - 08/10 0700 In: 1028.5 [I.V.:822.4; IV Piggyback:206.1] Out: 225 [Urine:225] Intake/Output this shift: No intake/output data recorded.  General appearance: alert, cooperative, no distress and Generalized swelling upper extremities. Resp: clear to auscultation bilaterally GI: Large abdomen soft, sore, midline incision looks good.  Bowel sounds are hypoactive.  She does not know whether she has had a bowel movement or flatus.  Lab Results:  Recent Labs    06/25/20 0353 06/26/20 0431  WBC 11.4* 12.8*  HGB 8.3* 7.8*  HCT 28.6* 27.2*  PLT 153 142*    BMET Recent Labs    06/25/20 0353 06/26/20 0431  NA 137 132*  K 4.1 4.2  CL 112* 106  CO2 20* 20*  GLUCOSE 109* 88  BUN 17 19  CREATININE 1.64* 1.67*  CALCIUM 7.5* 6.8*   PT/INR No results for input(s): LABPROT, INR in the last 72 hours.  Recent Labs  Lab 06/20/20 0604 06/21/20 0518  AST 16 15  ALT 12  12  ALKPHOS 87 77  BILITOT 0.7 0.7  PROT 4.9* 4.5*  ALBUMIN 2.2* 2.1*     Lipase     Component Value Date/Time   LIPASE 27 06/11/2020 0123     Medications:  acetaminophen  1,000 mg Oral Q6H   Chlorhexidine Gluconate Cloth  6 each Topical Daily   feeding supplement  1 Container Oral TID BM   midodrine  5 mg Oral TID WC   pantoprazole  40 mg Oral Daily   sodium bicarbonate  650 mg Oral BID   sodium chloride flush  3 mL Intravenous Q12H    dextrose 5 % and 0.9% NaCl 75 mL/hr at 06/26/20 0904   piperacillin-tazobactam (ZOSYN)  IV 3.375 g (06/26/20 0530)    Assessment/Plan SIRS secondary to UTI/GI bleed Tachy-Brady syndrome - PTVP 11/2014 Severe MR s/p repair with 28 mm annuloplasty ring/oversew LAA, 2003 Hx NSVT/chronic atrial fibrillation-not on anticoagulation secondary to GI bleed Hx CHF Hx pulmonary hypertension Hx anemia Hx CKD with AKI  - creatinine is up 1.18>>1.38>>1.64>>1.67(8/9) Hx gastritis Hx recent balloon dilatation [name] Hx spinal stenosis Hx hypertension/hypertension-on midodrine at home Hydronephrosis with cystoscopy/left retrograde pyelogram/insertion of left double-J stent 06/11/2020;Dr. Jeffie Pollock Hx remote tobacco use Malnutrition - prealbumin 11 (8/3)  Cecal Mass -adenocarcinoma - colonoscopy 06/18/20, Dr. Michail Sermon >>path: adenocarcinoma - [id number] (8/3) -S/p lap right hemicolectomy 8/5 Dr Marlou Starks  POD #5  -Postop ileus  FEN: IV fluids/clear liquids, Boostbreeze ID: Rocephin 7/26-7/28; amoxicillin 7/29 >> day#9 DVT: SCD's, ok for chemical DVT prophylaxis from surgical standpoint Follow-up: TBD   Plan: I am going to try her on clear liquids again today.  Asked the floor to start moving her more.  Ileus will not resolve while lying in bed. I was going to decrease IV fluids but,  with rising creatinine will defer to Dr. Grandville Silos.   Will review with Dr. Grandville Silos     LOS: 15 days    [name] 06/26/2020 Please  see Amion

## 2020-06-27 ENCOUNTER — Inpatient Hospital Stay (HOSPITAL_COMMUNITY): Payer: Medicare Other

## 2020-06-27 DIAGNOSIS — N390 Urinary tract infection, site not specified: Secondary | ICD-10-CM | POA: Diagnosis not present

## 2020-06-27 LAB — CBC
HCT: 25.5 % — ABNORMAL LOW (ref 36.0–46.0)
Hemoglobin: 7.3 g/dL — ABNORMAL LOW (ref 12.0–15.0)
MCH: 25.3 pg — ABNORMAL LOW (ref 26.0–34.0)
MCHC: 28.6 g/dL — ABNORMAL LOW (ref 30.0–36.0)
MCV: 88.2 fL (ref 80.0–100.0)
Platelets: 153 10*3/uL (ref 150–400)
RBC: 2.89 MIL/uL — ABNORMAL LOW (ref 3.87–5.11)
RDW: 18.5 % — ABNORMAL HIGH (ref 11.5–15.5)
WBC: 10 10*3/uL (ref 4.0–10.5)
nRBC: 0 % (ref 0.0–0.2)

## 2020-06-27 LAB — BASIC METABOLIC PANEL
Anion gap: 8 (ref 5–15)
BUN: 19 mg/dL (ref 8–23)
CO2: 18 mmol/L — ABNORMAL LOW (ref 22–32)
Calcium: 7.2 mg/dL — ABNORMAL LOW (ref 8.9–10.3)
Chloride: 113 mmol/L — ABNORMAL HIGH (ref 98–111)
Creatinine, Ser: 1.74 mg/dL — ABNORMAL HIGH (ref 0.44–1.00)
GFR calc Af Amer: 29 mL/min — ABNORMAL LOW (ref >60)
GFR calc non Af Amer: 25 mL/min — ABNORMAL LOW (ref >60)
Glucose, Bld: 95 mg/dL (ref 70–99)
Potassium: 4 mmol/L (ref 3.5–5.1)
Sodium: 139 mmol/L (ref 135–145)

## 2020-06-27 LAB — MAGNESIUM: Magnesium: 1.9 mg/dL (ref 1.7–2.4)

## 2020-06-27 NOTE — Progress Notes (Signed)
Pt c/o sharp, sudden pain in LUQ. Pt unable to describe pain further. Oxy IR given PO without relief. Pt ambulated approximately 30 feet and once returning to chair, belched multiple times before vomiting approximately 50 cc of yellow emesis. Pt also noted to have incontinent black, watery stool with small amount of bright red blood while ambulating. Pt given PRN Zofran by charge nurse. Pt assisted back to bed with standby assist. Pt reports relief now that she is back in bed. Surgery PA paged and made aware of the above.

## 2020-06-27 NOTE — Progress Notes (Signed)
5 Days Post-Op  Subjective: CC: Doing well. No abdominal pain, n/v. Passing flatus. 2 BM's yesterday.   Objective: Vital signs in last 24 hours: Temp:  [97.6 F (36.4 C)-97.7 F (36.5 C)] 97.6 F (36.4 C) (08/11 0509) Pulse Rate:  [68-72] 68 (08/11 0509) Resp:  [16-20] 20 (08/11 0509) BP: (92-107)/(43-45) 107/43 (08/11 0509) SpO2:  [98 %-99 %] 99 % (08/11 0509) Weight:  [93.4 kg] 93.4 kg (08/11 0509) Last BM Date: 06/27/20  Intake/Output from previous day: 08/10 0701 - 08/11 0700 In: 794.6 [I.V.:616.7; IV Piggyback:177.9] Out: -  Intake/Output this shift: No intake/output data recorded.  PE: Gen:  Alert, NAD, pleasant Pulm:  Normal rate and effort. On o2.  Abd: Soft, mild distension, mild abdominal tenderness of the epigastrium, otherwise NT, +BS, midline wound with staples intact except small portion at the base of the wound with kerlex in place. This was removed, base with healthy granulation tissue. No signs of wound infection.  Ext:  SCDs in place b/l Skin: no rashes noted, warm and dry   Lab Results:  Recent Labs    06/26/20 0431 06/27/20 0309  WBC 12.8* 10.0  HGB 7.8* 7.3*  HCT 27.2* 25.5*  PLT 142* 153   BMET Recent Labs    06/26/20 0431 06/27/20 0309  NA 132* 139  K 4.2 4.0  CL 106 113*  CO2 20* 18*  GLUCOSE 88 95  BUN 19 19  CREATININE 1.67* 1.74*  CALCIUM 6.8* 7.2*   PT/INR No results for input(s): LABPROT, INR in the last 72 hours. CMP     Component Value Date/Time   NA 139 06/27/2020 0309   NA 146 (H) 05/30/2019 1332   K 4.0 06/27/2020 0309   CL 113 (H) 06/27/2020 0309   CO2 18 (L) 06/27/2020 0309   GLUCOSE 95 06/27/2020 0309   BUN 19 06/27/2020 0309   BUN 25 05/30/2019 1332   CREATININE 1.74 (H) 06/27/2020 0309   CREATININE 1.49 (H) 07/30/2016 1509   CALCIUM 7.2 (L) 06/27/2020 0309   PROT 4.5 (L) 06/21/2020 0518   ALBUMIN 2.1 (L) 06/21/2020 0518   AST 15 06/21/2020 0518   ALT 12 06/21/2020 0518   ALKPHOS 77 06/21/2020  0518   BILITOT 0.7 06/21/2020 0518   GFRNONAA 25 (L) 06/27/2020 0309   GFRAA 29 (L) 06/27/2020 0309   Lipase     Component Value Date/Time   LIPASE 27 06/11/2020 0123       Studies/Results: DG Abd 1 View  Result Date: 06/27/2020 CLINICAL DATA:  Nausea and vomiting EXAM: ABDOMEN - 1 VIEW COMPARISON:  June 27, 2020 FINDINGS: Double-J stent is again noted on the left, unchanged in position. There remain loops of dilated bowel, similar to 1 day prior. No free air evident. Left lower lobe consolidation noted. IMPRESSION: Double-J stent on the left. Loops of dilated bowel remain. Suspect postoperative ileus, although a degree of bowel obstruction question. Opacity left lower lobe noted. Electronically Signed   By: Lowella Grip III M.D.   On: 06/27/2020 07:56   DG Abd 1 View  Result Date: 06/26/2020 CLINICAL DATA:  Abdominal pain EXAM: ABDOMEN - 1 VIEW COMPARISON:  June 25, 2020. FINDINGS: Double-J stent on the left is unchanged in position. Loops of dilated bowel are similar to 1 day prior. Surgical clips are noted in the right upper quadrant. There is calcification in the splenic artery. No appreciable free air on supine examination. No skin staples to the left of midline. IMPRESSION: Loops  of dilated bowel, similar to 1 day prior. Suspect postoperative ileus. A degree of bowel obstruction cannot be excluded. Double-J stent unchanged in position on the left. Clips right upper quadrant noted. Electronically Signed   By: Lowella Grip III M.D.   On: 06/26/2020 09:10    Anti-infectives: Anti-infectives (From admission, onward)   Start     Dose/Rate Route Frequency Ordered Stop   06/24/20 2200  piperacillin-tazobactam (ZOSYN) IVPB 3.375 g     Discontinue     3.375 g 12.5 mL/hr over 240 Minutes Intravenous Every 8 hours 06/24/20 2056     06/22/20 0911  sodium chloride 0.9 % with cefoTEtan (CEFOTAN) ADS Med       Note to Pharmacy: Georgena Spurling   : cabinet override      06/22/20 0911  06/22/20 1020   06/21/20 1115  cefoTEtan (CEFOTAN) 2 g in sodium chloride 0.9 % 100 mL IVPB        2 g 200 mL/hr over 30 Minutes Intravenous On call to O.R. 06/20/20 1346 06/22/20 0559   06/20/20 2200  amoxicillin (AMOXIL) capsule 500 mg  Status:  Discontinued        500 mg Oral Every 8 hours 06/20/20 1950 06/24/20 0725   06/14/20 1000  amoxicillin (AMOXIL) capsule 500 mg        500 mg Oral Every 12 hours 06/14/20 0741 06/19/20 0959   06/11/20 1030  cefTRIAXone (ROCEPHIN) 1 g in sodium chloride 0.9 % 100 mL IVPB  Status:  Discontinued        1 g 200 mL/hr over 30 Minutes Intravenous Every 24 hours 06/11/20 1021 06/14/20 0741   06/11/20 0300  cefTRIAXone (ROCEPHIN) 1 g in sodium chloride 0.9 % 100 mL IVPB        1 g 200 mL/hr over 30 Minutes Intravenous  Once 06/11/20 0259 06/11/20 0458       Assessment/Plan SIRS secondary to UTI/GI bleed Tachy-Brady syndrome - PTVP 11/2014 Severe MR s/p repair with 28 mm annuloplasty ring/oversew LAA, 2003 Hx NSVT/chronic atrial fibrillation-not on anticoagulation secondary to GI bleed Hx CHF Hx pulmonary hypertension Hx anemia  Hx CKD with AKI - creatinine is up 1.18>>1.38>>1.64>>1.67 >> 1.74 Hx gastritis Hx recent balloon dilatation [name] Hx spinal stenosis Hx hypertension/hypertension-on midodrine at home Hydronephrosis with cystoscopy/left retrograde pyelogram/insertion of left double-J stent 06/11/2020;Dr. Jeffie Pollock Hx remote tobacco use Malnutrition - prealbumin 11 (8/3)  Cecal Mass -adenocarcinoma - S/p Colonoscopy 06/18/20, Dr. Michail Sermon >>path: adenocarcinoma - CT chest with possible metastatic lesion in the right lung apex  - [id number] (8/3) - S/p Lap Right Hemicolectomy 8/5 Dr Marlou Starks - POD #6 - Path as noted below.  - Med Onc has seen here in the hospital and reports they have arranged outpt follow up  -Postop ileus resolving. Xray this AM with cont dilated loops of bowel but clinically improving (abdominal pain resolved,  tolerating cld without n/v, passing flatus, having bm's, normoactive bs on exam). Will adv to FLD. - BID WTD to open portion of wound.  - Mobilize, Pulm toilet  - PT/OT, recommending SNF  Path  - Invasive moderately differentiated mucinous adenocarcinoma, 7 cm, involving cecum  - Carcinoma invades into subserosa  - Resection margins are negative for carcinoma  - Negative for lymphovascular or perineural invasion  - Twenty lymph nodes, negative for carcinoma (0/20)   FEN: FLD, nutritional shakes, IVF per hospitalist ID: No further abx indicated from a general surgery standpoint. On Zosyn for PNA per [facility] DVT: SCD's,  ok for chemical DVT prophylaxis from surgical standpoint    LOS: 16 days    Jillyn Ledger , [facility] 06/27/2020, 10:01 AM Please see Amion for pager number during day hours 7:00am-4:30pm

## 2020-06-27 NOTE — Progress Notes (Signed)
PT Cancellation Note  Patient Details Name: [name] MRN: [id number] DOB: November 08, 1926   Cancelled Treatment:    Reason Eval/Treat Not Completed: Medical issues which prohibited therapy (vomiting/loose stools while ambulating with nursing-please see RN's note just prior to this one. Will hold PT today and check back another day. Thanks.    [facility]  Office: [phone] Pager: [phone]

## 2020-06-27 NOTE — Plan of Care (Signed)
  Problem: Education: Goal: Required Educational Video(s) Outcome: Completed/Met   Problem: Clinical Measurements: Goal: Ability to maintain clinical measurements within normal limits will improve Outcome: Progressing   Problem: Skin Integrity: Goal: Demonstration of wound healing without infection will improve Outcome: Progressing

## 2020-06-27 NOTE — Progress Notes (Signed)
PROGRESS NOTE    KANCHAN GAL  FAO:[id number] DOB: 08/30/1926 DOA: 06/11/2020 PCP: [name], MD  Brief Narrative:  8 fem tachybradycardia syndrome + PPM 2007 Severe MR status post repair Chronic atrial fibrillation CHADS2 score >4 not on anticoagulation due to GI bleed Presbyesophagus and gastritis on recent endoscopy 06/05/2020 Group 2 pulmonary HTN Gout Initially presented to ED 06/11/2020 with possible right dysuria RUQ pain T-max 99-blood pressures's 90s BUNs/creatinine up from baseline 1.5-63/3.1 Hemoglobin 7.9 FOBT positive CT abdomen moderate hydronephrosis-urology was consulted-stent placed 7/26-urine eventually grew lactobacillus and was transitioned eventually to oral Amoxil In the interim CT abdomen showed cecal mass concerning for malignancy-colonoscopy revealed 8/2 General surgery performed surgery 8/5-pathology = adenocarcinoma  Course complicated by possible ileus   Assessment & Plan:   Principal Problem:   Acute lower UTI Active Problems:   Liver cirrhosis (HCC)   CKD (chronic kidney disease) stage 3, GFR 30-59 ml/min   Anemia   Biventricular cardiac pacemaker in situ   Permanent atrial fibrillation (HCC)   Chronic diastolic CHF (congestive heart failure) (HCC)   Gout   S/P MVR (mitral valve repair)   AKI (acute kidney injury) ([place])   Orthostatic hypotension dysautonomic syndrome (HCC)   GI bleeding   Obstructive uropathy   Sepsis secondary to UTI ([place])   Hydronephrosis of left kidney   Left ureteral stone   Advanced care planning/counseling discussion   Goals of care, counseling/discussion   Palliative care by specialist   DNR (do not resuscitate)   Acute on chronic renal insufficiency   Iron deficiency anemia due to chronic blood loss   Cancer of cecum (HCC)   Emesis   Gastritis with hemorrhage   1. Cecal adenocarcinoma status post right colectomy 04/25/6294 complicated at this time by ileus a. One episode of emesis today-I have let nursing  know to contact general surgery with regards to advancement of diet b. Abdominal x-ray from this morning still shows persisting ileus and may need to go slower with diet c. Pain is moderately controlled she is without pain at rest--can continue OxyIR low-dose 2.5 every 4 as needed with sparing amounts of Dilaudid 2. Obstructive uropathy left-sided hydronephrosis + left ureteric stone and stent placement 06/11/2020 a. defer further definitive management of urinary stone to urologist likely in the outpatient setting continue very low-dose opiates if pain 3. Sepsis secondary to lactobacillus urinary infection 4. Probable aspiration 8/8 resulting in broadening of antibiotics a. Continues currently on Zosyn since 8/8 b. De-escalate when reliably taking p.o. c.  stop date oral antibiotics 8/17 5. Combined systolic diastolic heart failure a. Not on beta-blocker or ARB ACE probably because of chronic hypotension b. Continuing midodrine 5 mg 3 times daily but Demadex is on hold and we will resume tomorrow 6. Chronic hypotension 7. Permanent atrial fibrillation/tachybradycardia syndrome with PPM Not on anticoagulation secondary to GI bleed CHADS2 score >4 a. Does have underlying chronic paced rhythm with some PVCs at times continue as needed metoprolol 5 mg every 6 as needed high blood pressure b. Not on oral meds as above and not on anticoagulation as above 8. Hypernatremia 9. CKD 3B a. Relatively resolved no new issues at this time b. Continue bicarb 650 twice daily c. D5 0.9 saline at 20 cc an hour 10. Iron deficiency anemia status post 1 unit PRBC this admission + Feraheme a. Will need redosing of Feraheme?  Transfusion threshold is 7.0 recheck in a.m. 11. Acute superimposed chronic kidney disease stage IIIb a. Mild increase in  azotemia recheck trends tomorrow b. Monitor with labs  DVT prophylaxis:  Code Status:  Family Communication: Disposition:   Status is: Inpatient  Remains inpatient  appropriate because:Persistent severe electrolyte disturbances, Ongoing active pain requiring inpatient pain management, Altered mental status and IV treatments appropriate due to intensity of illness or inability to take PO   Dispo: The patient is from: Home              Anticipated d/c is to: SNF              Anticipated d/c date is: 2 days              Patient currently is not medically stable to d/c.       Consultants:   GI  General surgery  Neurology  Procedures: Multiple  Antimicrobials: Zosyn   Subjective: Sitting in the chair looking relatively comfortable and is wearing oxygen Tells me she had an episode of vomiting this morning after eating No chest pain Pain is controlled mainly when pressed She tells me she needed 2 or 3 people to help her get up She does not feel terribly swollen but on review of her skin she has some puffiness of her upper arms  Objective: Vitals:   06/26/20 1405 06/26/20 2132 06/27/20 0509 06/27/20 1300  BP: (!) 92/45 (!) 105/45 (!) 107/43 (!) 108/50  Pulse: 72 70 68 70  Resp: 16 20 20 14   Temp: 97.7 F (36.5 C) 97.7 F (36.5 C) 97.6 F (36.4 C) 97.7 F (36.5 C)  TempSrc:  Oral Oral Oral  SpO2: 98% 99% 99% 100%  Weight:   93.4 kg   Height:        Intake/Output Summary (Last 24 hours) at 06/27/2020 1315 Last data filed at 06/27/2020 1300 Gross per 24 hour  Intake 1350.66 ml  Output --  Net 1350.66 ml   Filed Weights   06/25/20 0617 06/26/20 0500 06/27/20 0509  Weight: 93.2 kg 93.4 kg 93.4 kg    Examination:  General exam: Awake alert coherent thick neck Mallampati 4 no icterus no pallor Respiratory system: Clear no rales no rhonchi Cardiovascular system: S1-S2 no murmur paced rhythm pacemaker in place-patient also has a central line in the right neck Gastrointestinal system: Soft bandage covering abdomen she does have some edema of her mons and of her upper thighs. Central nervous system: Intact no focal  deficit Extremities: ROM intact moving all 4 limbs equally no swellings Skin: Edema of lower extremities and of abdomen as well as upper arms Psychiatry: Euthymic pleasant  Data Reviewed: I have personally reviewed following labs and imaging studies Potassium 4.0 Bicarb down from 2218 BUN/creatinine 19/1.6-->90/1.7 WBC 12.8-->10.0 Hemoglobin 7.3   Radiology Studies: DG Abd 1 View  Result Date: 06/27/2020 CLINICAL DATA:  Nausea and vomiting EXAM: ABDOMEN - 1 VIEW COMPARISON:  June 27, 2020 FINDINGS: Double-J stent is again noted on the left, unchanged in position. There remain loops of dilated bowel, similar to 1 day prior. No free air evident. Left lower lobe consolidation noted. IMPRESSION: Double-J stent on the left. Loops of dilated bowel remain. Suspect postoperative ileus, although a degree of bowel obstruction question. Opacity left lower lobe noted. Electronically Signed   By: Lowella Grip III M.D.   On: 06/27/2020 07:56   DG Abd 1 View  Result Date: 06/26/2020 CLINICAL DATA:  Abdominal pain EXAM: ABDOMEN - 1 VIEW COMPARISON:  June 25, 2020. FINDINGS: Double-J stent on the left is unchanged in position. Loops  of dilated bowel are similar to 1 day prior. Surgical clips are noted in the right upper quadrant. There is calcification in the splenic artery. No appreciable free air on supine examination. No skin staples to the left of midline. IMPRESSION: Loops of dilated bowel, similar to 1 day prior. Suspect postoperative ileus. A degree of bowel obstruction cannot be excluded. Double-J stent unchanged in position on the left. Clips right upper quadrant noted. Electronically Signed   By: Lowella Grip III M.D.   On: 06/26/2020 09:10     Scheduled Meds: . (feeding supplement) PROSource Plus  30 mL Oral BID BM  . acetaminophen  1,000 mg Oral Q6H  . Chlorhexidine Gluconate Cloth  6 each Topical Daily  . feeding supplement  1 Container Oral TID BM  . midodrine  5 mg Oral TID WC   . multivitamin with minerals  1 tablet Oral Daily  . pantoprazole  40 mg Oral Daily  . sodium bicarbonate  650 mg Oral BID  . sodium chloride flush  3 mL Intravenous Q12H   Continuous Infusions: . dextrose 5 % and 0.9% NaCl 20 mL/hr at 06/26/20 1120  . piperacillin-tazobactam (ZOSYN)  IV 3.375 g (06/27/20 0545)     LOS: 16 days    Time spent: [place], MD [facility] To contact the attending provider between 7A-7P or the covering provider during after hours 7P-7A, please log into the web site [email or web address] and access using universal [place] password for that web site. If you do not have the password, please call the hospital operator.  06/27/2020, 1:15 PM

## 2020-06-28 ENCOUNTER — Inpatient Hospital Stay (HOSPITAL_COMMUNITY): Payer: Medicare Other

## 2020-06-28 DIAGNOSIS — Z515 Encounter for palliative care: Secondary | ICD-10-CM | POA: Diagnosis not present

## 2020-06-28 DIAGNOSIS — C180 Malignant neoplasm of cecum: Secondary | ICD-10-CM | POA: Diagnosis not present

## 2020-06-28 DIAGNOSIS — N390 Urinary tract infection, site not specified: Secondary | ICD-10-CM | POA: Diagnosis not present

## 2020-06-28 DIAGNOSIS — Z7189 Other specified counseling: Secondary | ICD-10-CM | POA: Diagnosis not present

## 2020-06-28 LAB — COMPREHENSIVE METABOLIC PANEL
ALT: 11 U/L (ref 0–44)
AST: 14 U/L — ABNORMAL LOW (ref 15–41)
Albumin: 1.9 g/dL — ABNORMAL LOW (ref 3.5–5.0)
Alkaline Phosphatase: 102 U/L (ref 38–126)
Anion gap: 9 (ref 5–15)
BUN: 19 mg/dL (ref 8–23)
CO2: 19 mmol/L — ABNORMAL LOW (ref 22–32)
Calcium: 7.4 mg/dL — ABNORMAL LOW (ref 8.9–10.3)
Chloride: 112 mmol/L — ABNORMAL HIGH (ref 98–111)
Creatinine, Ser: 1.78 mg/dL — ABNORMAL HIGH (ref 0.44–1.00)
GFR calc Af Amer: 28 mL/min — ABNORMAL LOW (ref >60)
GFR calc non Af Amer: 25 mL/min — ABNORMAL LOW (ref >60)
Glucose, Bld: 88 mg/dL (ref 70–99)
Potassium: 3.8 mmol/L (ref 3.5–5.1)
Sodium: 140 mmol/L (ref 135–145)
Total Bilirubin: 0.5 mg/dL (ref 0.3–1.2)
Total Protein: 4.5 g/dL — ABNORMAL LOW (ref 6.5–8.1)

## 2020-06-28 LAB — CBC WITH DIFFERENTIAL/PLATELET
Abs Immature Granulocytes: 0.07 10*3/uL (ref 0.00–0.07)
Basophils Absolute: 0 10*3/uL (ref 0.0–0.1)
Basophils Relative: 0 %
Eosinophils Absolute: 0.2 10*3/uL (ref 0.0–0.5)
Eosinophils Relative: 3 %
HCT: 25.2 % — ABNORMAL LOW (ref 36.0–46.0)
Hemoglobin: 7.3 g/dL — ABNORMAL LOW (ref 12.0–15.0)
Immature Granulocytes: 1 %
Lymphocytes Relative: 12 %
Lymphs Abs: 0.9 10*3/uL (ref 0.7–4.0)
MCH: 25.7 pg — ABNORMAL LOW (ref 26.0–34.0)
MCHC: 29 g/dL — ABNORMAL LOW (ref 30.0–36.0)
MCV: 88.7 fL (ref 80.0–100.0)
Monocytes Absolute: 0.5 10*3/uL (ref 0.1–1.0)
Monocytes Relative: 8 %
Neutro Abs: 5.5 10*3/uL (ref 1.7–7.7)
Neutrophils Relative %: 76 %
Platelets: 157 10*3/uL (ref 150–400)
RBC: 2.84 MIL/uL — ABNORMAL LOW (ref 3.87–5.11)
RDW: 18.5 % — ABNORMAL HIGH (ref 11.5–15.5)
WBC: 7.2 10*3/uL (ref 4.0–10.5)
nRBC: 0 % (ref 0.0–0.2)

## 2020-06-28 MED ORDER — GUAIFENESIN 100 MG/5ML PO SOLN
10.0000 mL | Freq: Once | ORAL | Status: AC
  Administered 2020-06-28: 200 mg via ORAL
  Filled 2020-06-28: qty 20

## 2020-06-28 MED ORDER — METOCLOPRAMIDE HCL 5 MG/ML IJ SOLN
5.0000 mg | Freq: Three times a day (TID) | INTRAMUSCULAR | Status: AC | PRN
  Administered 2020-06-28 – 2020-06-30 (×2): 5 mg via INTRAVENOUS
  Filled 2020-06-28 (×3): qty 2

## 2020-06-28 MED ORDER — PROMETHAZINE HCL 25 MG/ML IJ SOLN
12.5000 mg | Freq: Once | INTRAMUSCULAR | Status: AC
  Administered 2020-06-28: 12.5 mg via INTRAVENOUS
  Filled 2020-06-28: qty 1

## 2020-06-28 MED ORDER — AMOXICILLIN-POT CLAVULANATE 500-125 MG PO TABS
1.0000 | ORAL_TABLET | Freq: Two times a day (BID) | ORAL | Status: DC
  Administered 2020-06-28 – 2020-07-03 (×11): 500 mg via ORAL
  Filled 2020-06-28 (×11): qty 1

## 2020-06-28 NOTE — Progress Notes (Signed)
Daily Progress Note   Patient Name: [name]       Date: 06/28/2020 DOB: 1926/02/21  Age: 86 y.o. MRN#: [id number] Attending Physician: Nita Sells, MD Primary Care Physician: [name], MD Admit Date: 06/11/2020  Reason for Consultation/Follow-up: Establishing goals of care  Subjective: Followup visit with patient re: goals of care. Noted ongoing nausea, vomiting. Margrit tells me she her goals have not changed, she is comfortable continuing with full scope care, and she continues to desire full code.   ROS  Length of Stay: 17  Current Medications: Scheduled Meds:  . (feeding supplement) PROSource Plus  30 mL Oral BID BM  . acetaminophen  1,000 mg Oral Q6H  . amoxicillin-clavulanate  1 tablet Oral BID  . Chlorhexidine Gluconate Cloth  6 each Topical Daily  . feeding supplement  1 Container Oral TID BM  . midodrine  5 mg Oral TID WC  . multivitamin with minerals  1 tablet Oral Daily  . pantoprazole  40 mg Oral Daily  . promethazine  12.5 mg Intravenous Once  . sodium bicarbonate  650 mg Oral BID  . sodium chloride flush  3 mL Intravenous Q12H    Continuous Infusions: . dextrose 5 % and 0.9% NaCl 20 mL/hr at 06/28/20 1704    PRN Meds: HYDROmorphone (DILAUDID) injection, metoCLOPramide (REGLAN) injection, metoprolol tartrate, ondansetron (ZOFRAN) IV, oxyCODONE, sodium chloride flush  Physical Exam          Vital Signs: BP (!) 108/47 (BP Location: Right Arm)   Pulse 69   Temp 97.6 F (36.4 C) (Oral)   Resp (!) 22   Ht 5\' 2"  (1.575 m)   Wt 99.3 kg   LMP 10/10/2013   SpO2 100%   BMI 40.04 kg/m  SpO2: SpO2: 100 % O2 Device: O2 Device: Room Air O2 Flow Rate: O2 Flow Rate (L/min): 2 L/min  Intake/output summary:   Intake/Output Summary (Last 24  hours) at 06/28/2020 1741 Last data filed at 06/28/2020 1400 Gross per 24 hour  Intake 1095.78 ml  Output --  Net 1095.78 ml   LBM: Last BM Date: 06/28/20 Baseline Weight: Weight: 88 kg Most recent weight: Weight: 99.3 kg       Palliative Assessment/Data:    Flowsheet Rows     Most Recent Value  Intake Tab  Referral  Department Hospitalist  Unit at Time of Referral Other (Comment)  [urlogy/telementry]  Palliative Care Primary Diagnosis Cardiac  Date Notified 06/19/20  Palliative Care Type New Palliative care  Reason for referral Clarify Goals of Care  Date of Admission 06/11/20  Date first seen by Palliative Care 06/20/20  # of days Palliative referral response time 1 Day(s)  # of days IP prior to Palliative referral 8  Clinical Assessment  Psychosocial & Spiritual Assessment  Palliative Care Outcomes      Patient Active Problem List   Diagnosis Date Noted  . Cancer of cecum ([place])   . Emesis   . Gastritis with hemorrhage   . Iron deficiency anemia due to chronic blood loss 06/21/2020  . Advanced care planning/counseling discussion   . Goals of care, counseling/discussion   . Palliative care by specialist   . DNR (do not resuscitate)   . Acute on chronic renal insufficiency   . Sepsis secondary to UTI ([name]) 06/12/2020  . Hydronephrosis of left kidney 06/12/2020  . Left ureteral stone 06/12/2020  . GI bleeding 06/11/2020  . Obstructive uropathy 06/11/2020  . Syncope and collapse 04/04/2020  . Influenza A   . Bronchiectasis with acute exacerbation ([place])   . Respiratory distress 10/06/2017  . Orthostatic hypotension dysautonomic syndrome ([place])   . AKI (acute kidney injury) ([place]) 05/21/2016  . Cellulitis of leg, right 08/18/2015  . NSVT (nonsustained ventricular tachycardia) ([place])   . Gout flare 08/16/2015  . Abnormal thyroid function test 08/15/2015  . Right ankle pain 08/15/2015  . History of gout 08/15/2015  . Ankle pain   . S/P MVR (mitral valve repair)  01/05/2014  . Acute posthemorrhagic anemia 12/29/2013  . Encounter for therapeutic drug monitoring 12/12/2013  . Acute lower UTI 11/27/2013  . Gout 11/07/2013  . Orthostatic hypotension 10/31/2013  . Chronic diastolic CHF (congestive heart failure) ([place])   . Hypertension   . Mitral valve disorder   . Permanent atrial fibrillation ([place]) 10/28/2013  . Biventricular cardiac pacemaker in situ 10/14/2013  . Perirectal abscess 10/13/2013  . Chronic anticoagulation 10/10/2013  . Liver cirrhosis ([place]) 10/10/2013  . Thrombocytopenia ([place]) 10/10/2013  . Nausea and vomiting 10/10/2013  . CKD (chronic kidney disease) stage 3, GFR 30-59 ml/min 10/10/2013  . Anemia 10/10/2013    Palliative Care Assessment & Plan   Patient Profile: 71 yof admitted 06/11/20 for obstructive uropathy s/p stent placement by urology. Abdominal CT on admission revealed a large partially obstructive mass in the cecum. Colonoscopy redemonstrated this fungating mass and biopsy was consistent with adenocarcinoma. General surgery was consulted and is planning for laproscopic right colectomy. Chest imaging has also revealed a subpleural nodule. Oncology has been consulted and will be following. Palliative care was consulted for discussion on goals of care.  Assessment/Recommendations/Plan   Continue current full scope, full code  PMT will watch chart and intervene if patient declines  Patient and family are hopeful to d/c to SNF with Palliative following  Goals of Care and Additional Recommendations:  Limitations on Scope of Treatment: Full Scope Treatment  Code Status:  Full code  Prognosis:   Unable to determine  Discharge Planning:  [facility] for rehab with Palliative care service follow-up  Care plan was discussed with patient.  Thank you for allowing the Palliative Medicine Team to assist in the care of this patient.   Time In: 1610 Time Out: 1630 Total Time 20 mins Prolonged Time  Billed no      Greater than  50%  of this time was spent counseling and coordinating care related to the above assessment and plan.  Mariana Kaufman, AGNP-C Palliative Medicine   Please contact Palliative Medicine Team phone at [phone] for questions and concerns.

## 2020-06-28 NOTE — Progress Notes (Signed)
6 Days Post-Op  Subjective: CC: Doing well. No abdominal pain, n/v. Passing flatus. Having BM's with some blood noted.  Hgb stable  Objective: Vital signs in last 24 hours: Temp:  [97.5 F (36.4 C)-97.7 F (36.5 C)] 97.7 F (36.5 C) (08/12 0653) Pulse Rate:  [70-72] 70 (08/12 0653) Resp:  [14-22] 22 (08/12 0653) BP: (100-108)/(47-51) 106/47 (08/12 0653) SpO2:  [97 %-100 %] 97 % (08/12 0653) Weight:  [99.3 kg] 99.3 kg (08/12 0704) Last BM Date: 06/28/20  Intake/Output from previous day: 08/11 0701 - 08/12 0700 In: 1328.8 [P.O.:600; I.V.:578; IV Piggyback:150.8] Out: 50 [Emesis/NG output:50] Intake/Output this shift: Total I/O In: 340.2 [P.O.:240; I.V.:62; IV Piggyback:38.2] Out: -   PE: Gen:  Alert, NAD, pleasant Pulm:  Normal rate and effort. On o2.  Abd: Soft, mild distension, mild abdominal tenderness of the epigastrium, otherwise NT, +BS, midline wound with staples intact except small portion at the base of the wound with kerlex in place.  No signs of wound infection.  Ext:  SCDs in place b/l Skin: no rashes noted, warm and dry   Lab Results:  Recent Labs    06/27/20 0309 06/28/20 0355  WBC 10.0 7.2  HGB 7.3* 7.3*  HCT 25.5* 25.2*  PLT 153 157   BMET Recent Labs    06/27/20 0309 06/28/20 0355  NA 139 140  K 4.0 3.8  CL 113* 112*  CO2 18* 19*  GLUCOSE 95 88  BUN 19 19  CREATININE 1.74* 1.78*  CALCIUM 7.2* 7.4*   PT/INR No results for input(s): LABPROT, INR in the last 72 hours. CMP     Component Value Date/Time   NA 140 06/28/2020 0355   NA 146 (H) 05/30/2019 1332   K 3.8 06/28/2020 0355   CL 112 (H) 06/28/2020 0355   CO2 19 (L) 06/28/2020 0355   GLUCOSE 88 06/28/2020 0355   BUN 19 06/28/2020 0355   BUN 25 05/30/2019 1332   CREATININE 1.78 (H) 06/28/2020 0355   CREATININE 1.49 (H) 07/30/2016 1509   CALCIUM 7.4 (L) 06/28/2020 0355   PROT 4.5 (L) 06/28/2020 0355   ALBUMIN 1.9 (L) 06/28/2020 0355   AST 14 (L) 06/28/2020 0355   ALT 11  06/28/2020 0355   ALKPHOS 102 06/28/2020 0355   BILITOT 0.5 06/28/2020 0355   GFRNONAA 25 (L) 06/28/2020 0355   GFRAA 28 (L) 06/28/2020 0355   Lipase     Component Value Date/Time   LIPASE 27 06/11/2020 0123       Studies/Results: DG Abd 1 View  Result Date: 06/27/2020 CLINICAL DATA:  Nausea and vomiting EXAM: ABDOMEN - 1 VIEW COMPARISON:  June 27, 2020 FINDINGS: Double-J stent is again noted on the left, unchanged in position. There remain loops of dilated bowel, similar to 1 day prior. No free air evident. Left lower lobe consolidation noted. IMPRESSION: Double-J stent on the left. Loops of dilated bowel remain. Suspect postoperative ileus, although a degree of bowel obstruction question. Opacity left lower lobe noted. Electronically Signed   By: Lowella Grip III M.D.   On: 06/27/2020 07:56   DG Abd Portable 1V  Result Date: 06/28/2020 CLINICAL DATA:  Ileus. EXAM: PORTABLE ABDOMEN - 1 VIEW COMPARISON:  06/27/2020 FINDINGS: Persistent air-filled mildly dilated small bowel loop in the left abdomen measuring 4 cm in diameter. Air is present throughout the colon. No free peritoneal air. Moderate gaseous distention of the stomach. Double-J left-sided internal ureteral stent unchanged. Remainder of the exam is unchanged. IMPRESSION: Persistent  minimally dilated small bowel loops with air throughout the colon likely due to ileus. Electronically Signed   By: Marin Olp M.D.   On: 06/28/2020 07:58    Anti-infectives: Anti-infectives (From admission, onward)   Start     Dose/Rate Route Frequency Ordered Stop   06/24/20 2200  piperacillin-tazobactam (ZOSYN) IVPB 3.375 g     Discontinue     3.375 g 12.5 mL/hr over 240 Minutes Intravenous Every 8 hours 06/24/20 2056     06/22/20 0911  sodium chloride 0.9 % with cefoTEtan (CEFOTAN) ADS Med       Note to Pharmacy: Georgena Spurling   : cabinet override      06/22/20 0911 06/22/20 1020   06/21/20 1115  cefoTEtan (CEFOTAN) 2 g in sodium  chloride 0.9 % 100 mL IVPB        2 g 200 mL/hr over 30 Minutes Intravenous On call to O.R. 06/20/20 1346 06/22/20 0559   06/20/20 2200  amoxicillin (AMOXIL) capsule 500 mg  Status:  Discontinued        500 mg Oral Every 8 hours 06/20/20 1950 06/24/20 0725   06/14/20 1000  amoxicillin (AMOXIL) capsule 500 mg        500 mg Oral Every 12 hours 06/14/20 0741 06/19/20 0959   06/11/20 1030  cefTRIAXone (ROCEPHIN) 1 g in sodium chloride 0.9 % 100 mL IVPB  Status:  Discontinued        1 g 200 mL/hr over 30 Minutes Intravenous Every 24 hours 06/11/20 1021 06/14/20 0741   06/11/20 0300  cefTRIAXone (ROCEPHIN) 1 g in sodium chloride 0.9 % 100 mL IVPB        1 g 200 mL/hr over 30 Minutes Intravenous  Once 06/11/20 0259 06/11/20 0458       Assessment/Plan SIRS secondary to UTI/GI bleed Tachy-Brady syndrome - PTVP 11/2014 Severe MR s/p repair with 28 mm annuloplasty ring/oversew LAA, 2003 Hx NSVT/chronic atrial fibrillation-not on anticoagulation secondary to GI bleed Hx CHF Hx pulmonary hypertension Hx anemia  Hx CKD with AKI - creatinine is up 1.18>>1.38>>1.64>>1.67 >> 1.74 Hx gastritis Hx recent balloon dilatation [name] Hx spinal stenosis Hx hypertension/hypertension-on midodrine at home Hydronephrosis with cystoscopy/left retrograde pyelogram/insertion of left double-J stent 06/11/2020;Dr. Jeffie Pollock Hx remote tobacco use Malnutrition - prealbumin 11 (8/3)  Cecal Mass -adenocarcinoma - S/p Colonoscopy 06/18/20, Dr. Michail Sermon >>path: adenocarcinoma - CT chest with possible metastatic lesion in the right lung apex  - [id number] (8/3) - S/p Lap Right Hemicolectomy 8/5 Dr Marlou Starks - POD #7 - Path as noted below.  - Med Onc has seen here in the hospital and reports they have arranged outpt follow up  -Postop ileus resolving. Xray this AM with cont dilated loops of bowel but clinically improving (abdominal pain resolved, tolerating cld without n/v, passing flatus, having bm's, normoactive  bs on exam). Will adv to reg diet and try Reglan prior to meals. - BID WTD to open portion of wound.  - Mobilize, Pulm toilet  - PT/OT, recommending SNF  Path  - Invasive moderately differentiated mucinous adenocarcinoma, 7 cm, involving cecum  - Carcinoma invades into subserosa  - Resection margins are negative for carcinoma  - Negative for lymphovascular or perineural invasion  - Twenty lymph nodes, negative for carcinoma (0/20)   FEN: FLD, nutritional shakes, IVF per hospitalist ID: No further abx indicated from a general surgery standpoint. On Zosyn for PNA per TRH DVT: SCD's, ok for chemical DVT prophylaxis from surgical standpoint    LOS:  32 days    Rosario Adie, MD  [facility]

## 2020-06-28 NOTE — Progress Notes (Signed)
PROGRESS NOTE    [name]  DDU:[id number] DOB: December 23, 1925 DOA: 06/11/2020 PCP: [name], MD  Brief Narrative:  88 fem tachybradycardia syndrome + PPM 2007 Severe MR status post repair 2003 Chronic atrial fibrillation CHADS2 score >4 not on anticoagulation due to GI bleed and on midodrine to keep pressure stable Presbyesophagus and gastritis on recent endoscopy 06/05/2020 Dr. Rosalie [facility] 2 pulmonary HTN Gout Initially presented to ED 06/11/2020 with possible right dysuria RUQ pain T-max 99-blood pressures's 90s BUNs/creatinine up from baseline 1.5-63/3.1 Hemoglobin 7.9 FOBT positive CT abdomen moderate hydronephrosis-urology was consulted-stent placed 7/26-urine eventually grew lactobacillus and was transitioned eventually to oral Amoxil However additionally CT abdomen showed cecal mass concerning for malignancy-colonoscopy revealed 8/2 General surgery performed surgery 8/5-pathology = 7 cm adenocarcinoma involving cecum invading into subserosal area without lymphovascular or perineural invasion 20 lymph nodes were negative for CA-CEA was 575 on 12/20/7626  Course complicated by possible ileus in addition to several dark watery stools for which GI revisited the plan of care and added Reglan for the same Patient has had some oozing from the bottom of the wound but overall has improved   Assessment & Plan:   Principal Problem:   Acute lower UTI Active Problems:   Liver cirrhosis (HCC)   CKD (chronic kidney disease) stage 3, GFR 30-59 ml/min   Anemia   Biventricular cardiac pacemaker in situ   Permanent atrial fibrillation ([facility])   Chronic diastolic CHF (congestive heart failure) (H[facility])   Gout   S/P MVR (mitral valve repair)   AKI (acute kidney injury) ([place])   Orthostatic hypotension dysautonomic syndrome ([place])   GI bleeding   Obstructive uropathy   Sepsis secondary to UTI ([place])   Hydronephrosis of left kidney   Left ureteral stone   Advanced care planning/counseling  discussion   Goals of care, counseling/discussion   Palliative care by specialist   DNR (do not resuscitate)   Acute on chronic renal insufficiency   Iron deficiency anemia due to chronic blood loss   Cancer of cecum (HCC)   Emesis   Gastritis with hemorrhage   1. Cecal adenocarcinoma status post right colectomy 01/16/5175 complicated at this time by ileus a. Defer to surgery planning appreciate their input regarding diet postop care and wound care b. Pain is moderately controlled she is without pain at rest--can continue OxyIR low-dose 2.5 every 4 as needed with sparing amounts of Dilaudid c. She is on low-dose IV fluid but weight is up since 8/4 from 90 to 99.3 kg d. Because her albumin is low she may have third spacing as evidenced by swelling e. ?  Hopeful to avoid but may need TPN  2. Obstructive uropathy left-sided hydronephrosis + left ureteric stone and stent placement 06/11/2020 a. defer further definitive management of urinary stone to urologist Dr. Jeffie Pollock as an outpatient 3. Sepsis secondary to lactobacillus urinary infection 4. Probable aspiration 8/8 resulting in broadening of antibiotics a. Continues currently on Zosyn since 8/8 and have deescalated to p.o. Augmentin 8/12 to finish on 8/17 b. Periodic chest x-ray 5. Combined systolic diastolic heart failure a. Not on beta-blocker or ARB ACE probably because of chronic hypotension b. Home dose of diuretics is on hold c. Continuing midodrine 5 mg 3 times daily 6. Chronic hypotension 7. Permanent atrial fibrillation/tachybradycardia syndrome with PPM Not on anticoagulation secondary to GI bleed CHADS2 score >4 a. Does have underlying chronic paced rhythm with some PVCs at times continue as needed metoprolol 5 mg every 6 as needed  high blood pressure b. Not on oral meds as above and not on anticoagulation as above 8. Hypernatremia 9. CKD 3B a. Relatively resolved no new issues at this time b. Continue bicarb 650 twice  daily c. D5 0.9 saline at 20 cc an hour but need to stop soon given positive fluid balance and swelling 10. Iron deficiency anemia status post 1 unit PRBC this admission + Feraheme a. Will need redosing of Feraheme?  Transfusion threshold is 7.0 recheck in a.m. 11. Acute superimposed chronic kidney disease stage IIIb a. Mild increase in azotemia recheck trends tomorrow b. Monitor with labs  DVT prophylaxis: SCD Code Status: Full CODE STATUS Family Communication: spoke  Disposition:   Status is: Inpatient  Remains inpatient appropriate because:Persistent severe electrolyte disturbances, Ongoing active pain requiring inpatient pain management, Altered mental status and IV treatments appropriate due to intensity of illness or inability to take PO   Dispo: The patient is from: Home              Anticipated d/c is to: SNF              Anticipated d/c date is: 2 days              Patient currently is not medically stable to d/c.       Consultants:   GI  General surgery  Neurology  Procedures: Multiple  Antimicrobials: Zosyn   Subjective: Sitting up looking fair Seems somewhat happy No chest pain It appears she had some emesis after her tomato juice-also she has had several loose stools after getting Reglan which appeared slightly bloody-General surgery is evaluating her for the same   Objective: Vitals:   06/27/20 2139 06/28/20 0653 06/28/20 0704 06/28/20 1148  BP: (!) 100/51 (!) 106/47  (!) 96/48  Pulse: 72 70  69  Resp: 20 (!) 22  (!) 24  Temp: (!) 97.5 F (36.4 C) 97.7 F (36.5 C)  97.7 F (36.5 C)  TempSrc: Oral Oral  Oral  SpO2: 100% 97%  94%  Weight:   99.3 kg   Height:        Intake/Output Summary (Last 24 hours) at 06/28/2020 1312 Last data filed at 06/28/2020 1004 Gross per 24 hour  Intake 1069.04 ml  Output 50 ml  Net 1019.04 ml   Filed Weights   06/26/20 0500 06/27/20 0509 06/28/20 0704  Weight: 93.4 kg 93.4 kg 99.3 kg     Examination:  General exam: Awake alert c pleasant thick neck Respiratory system: Clear no rales no rhonchi Cardiovascular system: S1-S2 no murmur paced rhythm pacemaker in place-patient also has a central line in the right neck Gastrointestinal system: Soft bandag was uncovered and I looked at the staples at the bottom her wound is very clean and healing well although she does have some serous fluid at the bottom Central nervous system: Intact no focal deficit moving 4 limbs relatively well Extremities: ROM intact m\ Skin: Edema of lower extremities and of abdomen as well as upper arms Psychiatry: Euthymic pleasant  Data Reviewed: I have personally reviewed following labs and imaging studies Potassium  3.8 Bicarb 19 BUN/creatinine 19/1.6--> 19/1.7 WBC 12.8-->10.0-->7.2 Hemoglobin 7.3 Platelet 157   Radiology Studies: DG Abd 1 View  Result Date: 06/27/2020 CLINICAL DATA:  Nausea and vomiting EXAM: ABDOMEN - 1 VIEW COMPARISON:  June 27, 2020 FINDINGS: Double-J stent is again noted on the left, unchanged in position. There remain loops of dilated bowel, similar to 1 day prior. No free  air evident. Left lower lobe consolidation noted. IMPRESSION: Double-J stent on the left. Loops of dilated bowel remain. Suspect postoperative ileus, although a degree of bowel obstruction question. Opacity left lower lobe noted. Electronically Signed   By: Lowella Grip III M.D.   On: 06/27/2020 07:56   DG Abd Portable 1V  Result Date: 06/28/2020 CLINICAL DATA:  Ileus. EXAM: PORTABLE ABDOMEN - 1 VIEW COMPARISON:  06/27/2020 FINDINGS: Persistent air-filled mildly dilated small bowel loop in the left abdomen measuring 4 cm in diameter. Air is present throughout the colon. No free peritoneal air. Moderate gaseous distention of the stomach. Double-J left-sided internal ureteral stent unchanged. Remainder of the exam is unchanged. IMPRESSION: Persistent minimally dilated small bowel loops with air  throughout the colon likely due to ileus. Electronically Signed   By: Marin Olp M.D.   On: 06/28/2020 07:58     Scheduled Meds: . (feeding supplement) PROSource Plus  30 mL Oral BID BM  . acetaminophen  1,000 mg Oral Q6H  . Chlorhexidine Gluconate Cloth  6 each Topical Daily  . feeding supplement  1 Container Oral TID BM  . midodrine  5 mg Oral TID WC  . multivitamin with minerals  1 tablet Oral Daily  . pantoprazole  40 mg Oral Daily  . sodium bicarbonate  650 mg Oral BID  . sodium chloride flush  3 mL Intravenous Q12H   Continuous Infusions: . dextrose 5 % and 0.9% NaCl 20 mL/hr at 06/26/20 1120  . piperacillin-tazobactam (ZOSYN)  IV 3.375 g (06/28/20 0649)     LOS: 17 days    Time spent: 92  Nita Sells, MD [facility] To contact the attending provider between 7A-7P or the covering provider during after hours 7P-7A, please log into the web site [email or web address] and access using universal [place] password for that web site. If you do not have the password, please call the hospital operator.  06/28/2020, 1:12 PM

## 2020-06-28 NOTE — Progress Notes (Signed)
Physical Therapy Treatment Patient Details Name: [name] MRN: [id number] DOB: 12/25/1925 Today's Date: 06/28/2020    History of Present Illness 86 yo female admitted with UTI SIRS. S/P cystoscopy, L double J stent placement 06/11/20. S/P R colectomy 06/22/20. Hx of Afib, CKD, anemia, CHF, NSVT, pacemaker, chronic back pain, spinal stenosis, orthostatic hypotension    PT Comments    The patient reports incontinence of B/B while in bed. Assisted with 1  To mobilize to [facility], Patient stands and pivots with RW and min assistance. Patient assisted back into bed. Continued having loose BM. RN aware. Continue PT. Patient's SPO2 on RA 100%, noted wheezing, coughing up sputum, 1 incident of dry heave.  Follow Up Recommendations  SNF     Equipment Recommendations  None recommended by PT    Recommendations for Other Services       Precautions / Restrictions Precautions Precautions: Fall Precaution Comments: incontinent of B/B check before sitting up    Mobility  Bed Mobility   Bed Mobility: Rolling;Sidelying to Sit Rolling: Min assist Sidelying to sit: Mod assist;HOB elevated   Sit to supine: Mod assist   General bed mobility comments: cues for log roll due to recent abd sx, mod A to elevate trunk to sitting, mod A to scoot  to bed edge, Assist with legs and trunk to return to side and supine  Transfers Overall transfer level: Needs assistance Equipment used: Rolling walker (2 wheeled) Transfers: Sit to/from Stand Sit to Stand: Min assist Stand pivot transfers: Min assist       General transfer comment: incontinent of BM, asissted to [facility] then back to bed.  Ambulation/Gait                 Stairs             Wheelchair Mobility    Modified Rankin (Stroke Patients Only)       Balance Overall balance assessment: Needs assistance Sitting-balance support: Feet supported Sitting balance-Leahy Scale: Good     Standing balance support: Bilateral upper  extremity supported Standing balance-Leahy Scale: Fair Standing balance comment: static for pericare hold ing RW                            Cognition Arousal/Alertness: Awake/alert Behavior During Therapy: WFL for tasks assessed/performed Overall Cognitive Status: Within Functional Limits for tasks assessed                                        Exercises      General Comments        Pertinent Vitals/Pain Faces Pain Scale: Hurts a little bit Pain Location: rectum Pain Descriptors / Indicators: Discomfort Pain Intervention(s): Monitored during session    Home Living                      Prior Function            PT Goals (current goals can now be found in the care plan section) Acute Rehab PT Goals Patient Stated Goal: agreeable to stand PT Goal Formulation: With patient Time For Goal Achievement: 07/12/20 Potential to Achieve Goals: Fair Progress towards PT goals: Progressing toward goals    Frequency    Min 2X/week      PT Plan Current plan remains appropriate;Frequency needs to be updated  Co-evaluation              AM-PAC PT "6 Clicks" Mobility   Outcome Measure  Help needed turning from your back to your side while in a flat bed without using bedrails?: A Little Help needed moving from lying on your back to sitting on the side of a flat bed without using bedrails?: A Little Help needed moving to and from a bed to a chair (including a wheelchair)?: A Little Help needed standing up from a chair using your arms (e.g., wheelchair or bedside chair)?: A Little Help needed to walk in hospital room?: A Lot Help needed climbing 3-5 steps with a railing? : Total 6 Click Score: 15    End of Session   Activity Tolerance: Patient limited by fatigue Patient left: in bed;with call bell/phone within reach;with bed alarm set Nurse Communication: Mobility status PT Visit Diagnosis: Difficulty in walking, not elsewhere  classified (R26.2);Muscle weakness (generalized) (M62.81)     Time: 2297-9892 PT Time Calculation (min) (ACUTE ONLY): 26 min  Charges:  $Therapeutic Activity: 23-37 mins                     [name] PT Acute Rehabilitation Services Pager [phone] Office [phone]    [name] 06/28/2020, 4:32 PM

## 2020-06-28 NOTE — Progress Notes (Signed)
Pt continues to feel nauseous with dry heaving and small amount of emesis. Since it is too early to give both Zofran and Reglan, Dr. Marcello Moores paged and made aware. Will continue to monitor.

## 2020-06-28 NOTE — Progress Notes (Signed)
   06/28/20 1148  Assess: MEWS Score  Temp 97.7 F (36.5 C)  BP (!) 96/48  Pulse Rate 69  Resp (!) 24  Level of Consciousness Alert  SpO2 94 %  O2 Device Room Air  Assess: MEWS Score  MEWS Temp 0  MEWS Systolic 1  MEWS Pulse 0  MEWS RR 1  MEWS LOC 0  MEWS Score 2  MEWS Score Color Yellow  Assess: if the MEWS score is Yellow or Red  Were vital signs taken at a resting state? Yes  Focused Assessment No change from prior assessment  Early Detection of Sepsis Score *See Row Information* Low  MEWS guidelines implemented *See Row Information* No, previously yellow, continue vital signs every 4 hours

## 2020-06-28 NOTE — Progress Notes (Signed)
Pt took 3 bites of tomato soup for lunch before feeling nauseous and vomited approximately 20 cc of yellow emesis. Zofran IV given.

## 2020-06-28 NOTE — Progress Notes (Signed)
Patient stated  she had BM but what writer noted was blood with clots. Bathed, tolerated well. HGb 7.3 this morning.

## 2020-06-29 DIAGNOSIS — N390 Urinary tract infection, site not specified: Secondary | ICD-10-CM | POA: Diagnosis not present

## 2020-06-29 LAB — CBC WITH DIFFERENTIAL/PLATELET
Abs Immature Granulocytes: 0.09 10*3/uL — ABNORMAL HIGH (ref 0.00–0.07)
Basophils Absolute: 0 10*3/uL (ref 0.0–0.1)
Basophils Relative: 0 %
Eosinophils Absolute: 0.2 10*3/uL (ref 0.0–0.5)
Eosinophils Relative: 3 %
HCT: 26 % — ABNORMAL LOW (ref 36.0–46.0)
Hemoglobin: 7.5 g/dL — ABNORMAL LOW (ref 12.0–15.0)
Immature Granulocytes: 1 %
Lymphocytes Relative: 19 %
Lymphs Abs: 1.2 10*3/uL (ref 0.7–4.0)
MCH: 25.7 pg — ABNORMAL LOW (ref 26.0–34.0)
MCHC: 28.8 g/dL — ABNORMAL LOW (ref 30.0–36.0)
MCV: 89 fL (ref 80.0–100.0)
Monocytes Absolute: 0.5 10*3/uL (ref 0.1–1.0)
Monocytes Relative: 7 %
Neutro Abs: 4.3 10*3/uL (ref 1.7–7.7)
Neutrophils Relative %: 70 %
Platelets: 169 10*3/uL (ref 150–400)
RBC: 2.92 MIL/uL — ABNORMAL LOW (ref 3.87–5.11)
RDW: 18.8 % — ABNORMAL HIGH (ref 11.5–15.5)
WBC: 6.2 10*3/uL (ref 4.0–10.5)
nRBC: 0 % (ref 0.0–0.2)

## 2020-06-29 LAB — BASIC METABOLIC PANEL
Anion gap: 10 (ref 5–15)
BUN: 23 mg/dL (ref 8–23)
CO2: 19 mmol/L — ABNORMAL LOW (ref 22–32)
Calcium: 7.5 mg/dL — ABNORMAL LOW (ref 8.9–10.3)
Chloride: 114 mmol/L — ABNORMAL HIGH (ref 98–111)
Creatinine, Ser: 1.9 mg/dL — ABNORMAL HIGH (ref 0.44–1.00)
GFR calc Af Amer: 26 mL/min — ABNORMAL LOW (ref >60)
GFR calc non Af Amer: 23 mL/min — ABNORMAL LOW (ref >60)
Glucose, Bld: 83 mg/dL (ref 70–99)
Potassium: 3.6 mmol/L (ref 3.5–5.1)
Sodium: 143 mmol/L (ref 135–145)

## 2020-06-29 MED ORDER — FUROSEMIDE 20 MG PO TABS
20.0000 mg | ORAL_TABLET | Freq: Two times a day (BID) | ORAL | Status: DC
  Administered 2020-06-29 – 2020-06-30 (×2): 20 mg via ORAL
  Filled 2020-06-29 (×2): qty 1

## 2020-06-29 NOTE — Progress Notes (Signed)
PROGRESS NOTE    [name]  FBP:[id number] DOB: [birth year] DOA: 06/11/2020 PCP: [name], MD  Brief Narrative:  36 fem tachybradycardia syndrome + PPM 2007 Severe MR status post repair 2003 Chronic atrial fibrillation CHADS2 score >4 not on anticoagulation due to GI bleed and on midodrine to keep pressure stable Presbyesophagus and gastritis on recent endoscopy 06/05/2020 Dr. Rosalie [facility] 2 pulmonary HTN Gout Initially presented to ED 06/11/2020 with possible right dysuria RUQ pain T-max 99-blood pressures's 90s BUNs/creatinine up from baseline 1.5-63/3.1 Hemoglobin 7.9 FOBT positive CT abdomen moderate hydronephrosis-urology was consulted-stent placed 7/26-urine eventually grew lactobacillus and was transitioned eventually to oral Amoxil However additionally CT abdomen showed cecal mass concerning for malignancy-colonoscopy revealed 8/2 General surgery performed surgery 8/5-pathology = 7 cm adenocarcinoma involving cecum invading into subserosal area without lymphovascular or perineural invasion 20 lymph nodes were negative for CA-CEA was 575 on 06/18/4234  Course complicated by possible ileus in addition to several dark watery stools for which Gen surg revisited the plan of care and added Reglan for the same Patient has had some oozing from the bottom of the wound but overall has improved and has been tolerating more liquid diet since 8/13   Assessment & Plan:   Principal Problem:   Acute lower UTI Active Problems:   Liver cirrhosis (HCC)   CKD (chronic kidney disease) stage 3, GFR 30-59 ml/min   Anemia   Biventricular cardiac pacemaker in situ   Permanent atrial fibrillation (HCC)   Chronic diastolic CHF (congestive heart failure) (HCC)   Gout   S/P MVR (mitral valve repair)   AKI (acute kidney injury) ([place])   Orthostatic hypotension dysautonomic syndrome ([place])   GI bleeding   Obstructive uropathy   Sepsis secondary to UTI ([place])   Hydronephrosis of left kidney    Left ureteral stone   Advanced care planning/counseling discussion   Goals of care, counseling/discussion   Palliative care by specialist   DNR (do not resuscitate)   Acute on chronic renal insufficiency   Iron deficiency anemia due to chronic blood loss   Cancer of cecum (HCC)   Emesis   Gastritis with hemorrhage   1. Cecal adenocarcinoma status post right colectomy 01/20/1442 complicated at this time by ileus a. Defer to surgery planning appreciate their input regarding diet postop care and wound care and advancement of diet-it appears she is tolerating diet better today 8/13 so I will stop her IV fluids and see if she continues to do well on a diet b. For pain OxyIR low-dose 2.5 every 4 as needed with sparing amounts of Dilaudid c. IV fluids discontinued 813 2. Obstructive uropathy left-sided hydronephrosis + left ureteric stone and stent placement 06/11/2020 a. defer further definitive management of urinary stone to urologist Dr. Jeffie Pollock as an outpatient 3. Sepsis secondary to lactobacillus urinary infection 4. Probable aspiration 8/8 resulting in broadening of antibiotics a. Continues currently on Zosyn since 8/8 and have deescalated to p.o. Augmentin 8/12 to finish on 8/17 b. Periodic chest x-ray 5. Combined systolic diastolic heart failure a. Not on beta-blocker or ARB ACE probably because of chronic hypotension b. Usually on Demadex 20 daily at home-cautious reinitiation of Lasix 20 twice daily 8/13 given she is 6 L positive c. Continuing midodrine 5 mg 3 times daily 6. Chronic hypotension 7. Permanent atrial fibrillation/tachybradycardia syndrome with PPM Not on anticoagulation secondary to GI bleed CHADS2 score >4 a. Does have underlying chronic paced rhythm with some PVCs at times continue as needed metoprolol 5  mg every 6 as needed high blood pressure b. Not on oral meds as above and not on anticoagulation as above 8. Hypernatremia 9. CKD 3B a. Creatinine variable GFR ranges  between 25 and 23 b. Continue bicarb 650 twice daily 10. Iron deficiency anemia status post 1 unit PRBC this admission + Feraheme a. Will need redosing of Feraheme?  Transfusion threshold is 7.0 recheck in a.m. 11. Acute superimposed chronic kidney disease stage IIIb a. See above discussion regarding fluid status  DVT prophylaxis: SCD Code Status: Full CODE STATUS Family Communication: spoke in detail on 8/11 and 8/12 with daughter-I will call her again tomorrow Disposition:   Status is: Inpatient  Remains inpatient appropriate because:Persistent severe electrolyte disturbances, Ongoing active pain requiring inpatient pain management, Altered mental status and IV treatments appropriate due to intensity of illness or inability to take PO   Dispo: The patient is from: Home              Anticipated d/c is to: SNF              Anticipated d/c date is: 2 days              Patient currently is not medically stable to d/c.       Consultants:   GI  General surgery  Neurology  Procedures: Multiple  Antimicrobials: Zosyn--Augmentin finishing on 8/17   Subjective:  Looks well No chest pain Nursing reports tolerated some Ensure and was able to keep it down no further reports of dark or tarry stool Does not complain of much abdominal pain except where the dressing was Overall has improved  Objective: Vitals:   06/29/20 0500 06/29/20 0510 06/29/20 1053 06/29/20 1258  BP:  (!) 98/55 (!) 110/59 (!) 100/46  Pulse:  70 70 68  Resp:  20  20  Temp:  97.6 F (36.4 C)  (!) 97.5 F (36.4 C)  TempSrc:  Oral  Oral  SpO2:  99% 99% 99%  Weight: 94 kg     Height:        Intake/Output Summary (Last 24 hours) at 06/29/2020 1411 Last data filed at 06/29/2020 1100 Gross per 24 hour  Intake 859.66 ml  Output --  Net 859.66 ml   Filed Weights   06/27/20 0509 06/28/20 0704 06/29/20 0500  Weight: 93.4 kg 99.3 kg 94 kg    Examination:    General exam: Awake alert c pleasant  thick neck Respiratory system: Clear no rales no rhonchi Cardiovascular system: S1-S2 no murmur paced rhythm pacemaker in place-patient also has a central line in the right neck Gastrointestinal system: Did not examine wound today Central nervous system: Intact no focal deficit moving 4 limbs relatively well Extremities: ROM intact no other issue Skin: Edema of lower extremities and of abdomen as well as upper arms seems to be slightly less than prior Psychiatry: Euthymic pleasant  Data Reviewed: I have personally reviewed following labs and imaging studies Potassium  3.6 Bicarb 19 BUN/creatinine 19/1.6--> 19/1.7-->23/1.9 WBC 12.8-->10.0-->7.2-->6.2 Hemoglobin 7.3-->7.5 Platelet 169   Radiology Studies: DG Abd Portable 1V  Result Date: 06/28/2020 CLINICAL DATA:  Ileus. EXAM: PORTABLE ABDOMEN - 1 VIEW COMPARISON:  06/27/2020 FINDINGS: Persistent air-filled mildly dilated small bowel loop in the left abdomen measuring 4 cm in diameter. Air is present throughout the colon. No free peritoneal air. Moderate gaseous distention of the stomach. Double-J left-sided internal ureteral stent unchanged. Remainder of the exam is unchanged. IMPRESSION: Persistent minimally dilated small bowel loops with air  throughout the colon likely due to ileus. Electronically Signed   By: Marin Olp M.D.   On: 06/28/2020 07:58     Scheduled Meds: . (feeding supplement) PROSource Plus  30 mL Oral BID BM  . acetaminophen  1,000 mg Oral Q6H  . amoxicillin-clavulanate  1 tablet Oral BID  . Chlorhexidine Gluconate Cloth  6 each Topical Daily  . feeding supplement  1 Container Oral TID BM  . furosemide  20 mg Oral BID  . midodrine  5 mg Oral TID WC  . multivitamin with minerals  1 tablet Oral Daily  . pantoprazole  40 mg Oral Daily  . sodium bicarbonate  650 mg Oral BID  . sodium chloride flush  3 mL Intravenous Q12H   Continuous Infusions:    LOS: 18 days    Time spent: [name],  MD [facility] To contact the attending provider between 7A-7P or the covering provider during after hours 7P-7A, please log into the web site [email or web address] and access using universal [place] password for that web site. If you do not have the password, please call the hospital operator.  06/29/2020, 2:11 PM

## 2020-06-29 NOTE — Progress Notes (Signed)
Physical Therapy Treatment Patient Details Name: [name] MRN: [id number] DOB: 01-25-1926 Today's Date: 06/29/2020    History of Present Illness 86 yo female admitted with UTI SIRS. S/P cystoscopy, L double J stent placement 06/11/20. S/P R colectomy 06/22/20. Hx of Afib, CKD, anemia, CHF, NSVT, pacemaker, chronic back pain, spinal stenosis, orthostatic hypotension    PT Comments    Assisted OOB to [facility] immediately due to incont urine and present with loose, watery, dark, greenish stools.  General transfer comment: assisted from bed to [facility] 1/4 turn with increased time needed to complete.  Some difficulty weight shifting and back steps.General Gait Details: pt only progressed gait to 3 feet despite MAX encouragement.  Pt repeated "my back hurts" and also present with decreased weight shift/weight bearing R LE. Positioned in recliner to comfort as lunch arrived (liquids).   Follow Up Recommendations  SNF     Equipment Recommendations  None recommended by PT    Recommendations for Other Services       Precautions / Restrictions Precautions Precautions: Fall Precaution Comments: loose stools    Mobility  Bed Mobility Overal bed mobility: Needs Assistance Bed Mobility: Supine to Sit     Supine to sit: Mod assist     General bed mobility comments: required assist with upper body due to recent ABD surgery.  Also struggled to complete scooting to EOB.  Transfers Overall transfer level: Needs assistance Equipment used: Rolling walker (2 wheeled) Transfers: Sit to/from [facility] Sit to Stand: Min assist Stand pivot transfers: Min assist;Mod assist       General transfer comment: assisted from bed to [facility] 1/4 turn with increased time needed to complete.  Some difficulty weight shifting and back steps.  Ambulation/Gait Ambulation/Gait assistance: Min assist Gait Distance (Feet): 3 Feet Assistive device: Rolling walker (2 wheeled) Gait Pattern/deviations:  Step-to pattern Gait velocity: decreased   General Gait Details: pt only progressed gait to 3 feet despite MAX encouragement.  Pt repeated "my back hurts" and also present with decreased weight shift/weight bearing R LE.   Stairs             Wheelchair Mobility    Modified Rankin (Stroke Patients Only)       Balance                                            Cognition   Behavior During Therapy: WFL for tasks assessed/performed Overall Cognitive Status: Within Functional Limits for tasks assessed                                 General Comments: AxO x 3 HOH and required increased time to complete task      Exercises      General Comments        Pertinent Vitals/Pain Pain Assessment: No/denies pain    Home Living                      Prior Function            PT Goals (current goals can now be found in the care plan section)      Frequency    Min 2X/week      PT Plan Current plan remains appropriate;Frequency needs to be updated    Co-evaluation  AM-PAC PT "6 Clicks" Mobility   Outcome Measure  Help needed turning from your back to your side while in a flat bed without using bedrails?: A Lot Help needed moving from lying on your back to sitting on the side of a flat bed without using bedrails?: A Lot Help needed moving to and from a bed to a chair (including a wheelchair)?: A Lot Help needed standing up from a chair using your arms (e.g., wheelchair or bedside chair)?: A Lot Help needed to walk in hospital room?: A Lot Help needed climbing 3-5 steps with a railing? : Total 6 Click Score: 11    End of Session Equipment Utilized During Treatment: Gait belt Activity Tolerance: Patient limited by fatigue;No increased pain Patient left: in chair;with call bell/phone within reach;with chair alarm set Nurse Communication: Mobility status PT Visit Diagnosis: Difficulty in walking, not  elsewhere classified (R26.2);Muscle weakness (generalized) (M62.81)     Time: 7711-6579 PT Time Calculation (min) (ACUTE ONLY): 26 min  Charges:  $Gait Training: 8-22 mins $Therapeutic Activity: 8-22 mins                     Rica Koyanagi  PTA Acute  Rehabilitation Services Pager      [phone] Office      [phone]

## 2020-06-29 NOTE — TOC Progression Note (Addendum)
Transition of Care [facility]) - Progression Note    Patient Details  Name: [name] MRN: [id number] Date of Birth: 10/29/1926  Transition of Care [facility]) CM/SW Contact  Coumba Kellison, Juliann Pulse, RN Phone Number: 06/29/2020, 11:20 AM  Clinical Narrative: [place] are unable to provide TPN @ SNF,they can manage PEG TF bolus.Current d/c plan [place] SNF w/Palliative Care Services.Will need auth, & covid within 24-48hr prior d/c.     Expected Discharge Plan: [place] Barriers to Discharge: Continued Medical Work up  Expected Discharge Plan and Services Expected Discharge Plan: [place]   Discharge Planning Services: CM Consult Post Acute Care Choice: [place] Living arrangements for the past 2 months: [place]                                       Social Determinants of Health (SDOH) Interventions    Readmission Risk Interventions No flowsheet data found.

## 2020-06-29 NOTE — Progress Notes (Signed)
7 Days Post-Op    CC: Abdominal pain, black stools, worsening pallor and confusion  Subjective: Patient is awake and alert.  She does not remember the day.  She does know she is at [place] [facility], and she knows she had some kind of surgery on her bowel.  She has a fair amount of drainage at the base of her incision but I cannot tell if it is from her midline incision which has an open area, or from urinary incontinence.  I/os shows 4 BMs yesterday and one this morning.  Patient does not remember being sick yesterday.  Objective: Vital signs in last 24 hours: Temp:  [97.6 F (36.4 C)-97.9 F (36.6 C)] 97.6 F (36.4 C) (08/13 0510) Pulse Rate:  [69-70] 70 (08/13 0510) Resp:  [18-24] 20 (08/13 0510) BP: (96-108)/(47-55) 98/55 (08/13 0510) SpO2:  [94 %-100 %] 99 % (08/13 0510) Weight:  [94 kg] 94 kg (08/13 0500) Last BM Date: 06/28/20 780 p.o. 500 IV Voided x4 BM x4 Afebrile vital signs are stable Potassium 3.6, Creatinine 1.9 WBC six-point 2 H/H 7.5/26 Platelets 169K Abdominal film 8/12: Soft persistent mildly dilated small bowel loops with air throughout the colon likely due to ileus.  Intake/Output from previous day: 08/12 0701 - 08/13 0700 In: 1285 [P.O.:780; I.V.:461.7; IV Piggyback:43.3] Out: -  Intake/Output this shift: No intake/output data recorded.  General appearance: alert, cooperative and no distress Resp: Clear anterior exam GI: Soft, positive bowel sounds, she has had multiple BMs.  Appears to be tolerating full liquids well.  Incision is open at the base.  This is clean.  Whole area is wet but I cannot tell whether it is from wound drainage or urinary incontinence.  Lab Results:  Recent Labs    06/28/20 0355 06/29/20 0335  WBC 7.2 6.2  HGB 7.3* 7.5*  HCT 25.2* 26.0*  PLT 157 169    BMET Recent Labs    06/28/20 0355 06/29/20 0335  NA 140 143  K 3.8 3.6  CL 112* 114*  CO2 19* 19*  GLUCOSE 88 83  BUN 19 23  CREATININE 1.78* 1.90*  CALCIUM 7.4*  7.5*   PT/INR No results for input(s): LABPROT, INR in the last 72 hours.  Recent Labs  Lab 06/28/20 0355  AST 14*  ALT 11  ALKPHOS 102  BILITOT 0.5  PROT 4.5*  ALBUMIN 1.9*     Lipase     Component Value Date/Time   LIPASE 27 06/11/2020 0123     Medications: . (feeding supplement) PROSource Plus  30 mL Oral BID BM  . acetaminophen  1,000 mg Oral Q6H  . amoxicillin-clavulanate  1 tablet Oral BID  . Chlorhexidine Gluconate Cloth  6 each Topical Daily  . feeding supplement  1 Container Oral TID BM  . midodrine  5 mg Oral TID WC  . multivitamin with minerals  1 tablet Oral Daily  . pantoprazole  40 mg Oral Daily  . sodium bicarbonate  650 mg Oral BID  . sodium chloride flush  3 mL Intravenous Q12H    Assessment/Plan SIRS secondary to UTI/GI bleed Tachy-Brady syndrome - PTVP 11/2014 Severe MR s/p repair with 28 mm annuloplasty ring/oversew LAA, 2003 Hx NSVT/chronic atrial fibrillation-not on anticoagulation secondary to GI bleed Hx CHF Hx pulmonary hypertension Hx anemia  Hx CKD with AKI - creatinine is up 1.18>>1.38>>1.64>>1.67 >> 1.74 Hx gastritis Hx recent balloon dilatation [name] Hx spinal stenosis Hx hypertension/hypertension-on midodrine at home Hydronephrosis with cystoscopy/left retrograde pyelogram/insertion of left double-J  stent 06/11/2020;Dr. Jeffie Pollock Hx remote tobacco use Malnutrition - prealbumin 11 (8/3)  Cecal Mass -adenocarcinoma - S/p Colonoscopy 06/18/20, Dr. Michail Sermon >>path: adenocarcinoma - CT chest with possible metastatic lesion in the right lung apex  - [id number] (8/3) - S/p Lap Right Hemicolectomy 8/5 Dr Doloris Hall #5 - Path as noted below.  - Med Onc has seen here in the hospital and reports they have arranged outpt follow up -Postop ileus resolving. Xray 8/12 with cont dilated loops of bowel but clinically improving (abdominal pain resolved, tolerating cld without n/v, passing flatus, having bm's, normoactive bs on exam).  Will adv to reg diet and try Reglan prior to meals. - BID WTD to open portion of wound.  - Mobilize, Pulm toilet  - PT/OT, recommending SNF   Path  - Invasive moderately differentiated mucinous adenocarcinoma, 7 cm, involving cecum  - Carcinoma invades into subserosa  - Resection margins are negative for carcinoma  - Negative for lymphovascular or perineural invasion  - Twenty lymph nodes, negative for carcinoma (0/20)   FEN: FLD, nutritional shakes, IVF per hospitalist  >> soft diet today ID: No further abx indicated from a general surgery standpoint. On Zosyn for PNA per TRH DVT: SCD's, ok for chemical DVT prophylaxis from surgical standpoint  PLan:  Continue wet to dry dressings midline wound.  Soft diet today.           LOS: 18 days    [name] 06/29/2020 Please see Amion

## 2020-06-30 DIAGNOSIS — N390 Urinary tract infection, site not specified: Secondary | ICD-10-CM | POA: Diagnosis not present

## 2020-06-30 MED ORDER — SODIUM CHLORIDE 0.9 % IV SOLN
510.0000 mg | Freq: Once | INTRAVENOUS | Status: AC
  Administered 2020-06-30: 510 mg via INTRAVENOUS
  Filled 2020-06-30: qty 510

## 2020-06-30 MED ORDER — FUROSEMIDE 20 MG PO TABS
20.0000 mg | ORAL_TABLET | Freq: Every day | ORAL | Status: DC
  Administered 2020-07-01 – 2020-07-04 (×4): 20 mg via ORAL
  Filled 2020-06-30 (×4): qty 1

## 2020-06-30 NOTE — Progress Notes (Signed)
Patient had 6 beat run of Vtach, BP and HR stable. Asymptomatic. Dr. Verlon Au notified and verbal order received for IV team to draw magnesium level. Per Dr. Verlon Au, if less than 2, follow up with night coverage for replacement. Will continue with plan of care.

## 2020-06-30 NOTE — Progress Notes (Signed)
PROGRESS NOTE    [name]  YOV:[id number] DOB: January 27, 1926 DOA: 06/11/2020 PCP: [name], MD  Brief Narrative:  60 fem tachybradycardia syndrome + PPM 2007 Group 2 pulmonary HTN Severe MR status post repair 2003 Chronic atrial fibrillation CHADS2 score >4 not on anticoagulation due to GI bleed Chronic hypotension on midodrine 3 times daily Presbyesophagus and gastritis on recent endoscopy 06/05/2020 Dr. Rosalie Gums Gout  Initially presented to ED 06/11/2020 with possible right dysuria RUQ pain T-max 99-blood pressures's 90s BUNs/creatinine up from baseline 1.5-63/3.1 Hemoglobin 7.9 FOBT positive CT abdomen moderate hydronephrosis-urology was consulted-stent placed 7/26-urine eventually grew lactobacillus and was transitioned eventually to oral Amoxil However additionally CT abdomen showed cecal mass concerning for malignancy-colonoscopy revealed 8/2   General surgery performed surgery 8/5-pathology = 7 cm adenocarcinoma    involving cecum invading into subserosal area without      lymphovascular or perineural invasion 20 lymph nodes were negative for CA-CEA was 575 on 05/20/1286   course complicated by possible ileus in addition to several dark watery stools for which Gen surg revisited the plan of care and added Reglan for the same Patient has had some oozing from the bottom of the wound but overall has improved She has been on a solid diet reliably since 8/14  And is nearing discharge   Assessment & Plan:   Principal Problem:   Acute lower UTI Active Problems:   Liver cirrhosis (HCC)   CKD (chronic kidney disease) stage 3, GFR 30-59 ml/min   Anemia   Biventricular cardiac pacemaker in situ   Permanent atrial fibrillation (HCC)   Chronic diastolic CHF (congestive heart failure) (HCC)   Gout   S/P MVR (mitral valve repair)   AKI (acute kidney injury) ([place])   Orthostatic hypotension dysautonomic syndrome ([place])   GI bleeding   Obstructive uropathy   Sepsis secondary to  UTI ([place])   Hydronephrosis of left kidney   Left ureteral stone   Advanced care planning/counseling discussion   Goals of care, counseling/discussion   Palliative care by specialist   DNR (do not resuscitate)   Acute on chronic renal insufficiency   Iron deficiency anemia due to chronic blood loss   Cancer of cecum (HCC)   Emesis   Gastritis with hemorrhage   1. 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 Eating regular food no ileus now b. Continue OxyIR low-dose 2.5 every 4 as needed with sparing amounts of Dilaudid c. IV fluids discontinued 813 2. Obstructive uropathy left-sided hydronephrosis + left ureteric stone and stent placement 06/11/2020 a. defer further definitive management of urinary stone to urologist Dr. Jeffie Pollock as an outpatient-we will CC him b. Patient is nearing discharge and may need outpatient procedure 3. Sepsis secondary to lactobacillus urinary infection 4. Probable aspiration 8/8 resulting in broadening of antibiotics a. Continues currently on Zosyn since 8/8 and have deescalated to p.o. Augmentin 8/12 to finish on 8/17 b. Periodic chest x-ray as as needed 5. Combined systolic diastolic heart failure  6. chronic hypotension on midodrine 3 times daily 7. Pulmonary hypertension group 2 a. Not on beta-blocker or ARB ACE probably because of chronic hypotension b. Usually on Demadex 20 daily at home-placed on Lasix 20 on discharge c. Fluid status still +5 L from admission but weight is down from 94 to 93 kg d. Continuing midodrine 5 mg 3 times daily 8. Permanent atrial fibrillation/tachybradycardia syndrome with PPM Not on anticoagulation secondary to GI bleed CHADS2 score >4 a. D underlying paced rhythm b. Metoprolol  5 mg every 6 as needed high blood pressure c. Not on oral meds as above and not on anticoagulation as above 9. Hypernatremia is relatively resolved 10. CKD 3B a. Creatinine variable GFR ranges between 25 and 23 b. Continue  bicarb 650 twice daily 11. Iron deficiency anemia + anemia of renal disease status post 1 unit PRBC this admission + Feraheme a. Received Feraheme 8/6 b. Given hemoglobin is in the 7 range but not below, will redose 8/14 c. Needs iron studies in about 2 to 3 weeks at facility 12. Acute superimposed chronic kidney disease stage IIIb a. See above discussion regarding fluid status  DVT prophylaxis: SCD Code Status: Full CODE STATUS Family Communication: d/w daughter on phone on 06/30/2020 and she understands mother is nearing stability and may be able to discharge to skilled facility on 8/16 Disposition:   Status is: Inpatient  Remains inpatient appropriate because:Persistent severe electrolyte disturbances, Ongoing active pain requiring inpatient pain management, Altered mental status and IV treatments appropriate due to intensity of illness or inability to take PO   Dispo: The patient is from: Home              Anticipated d/c is to: SNF              Anticipated d/c date is: 2 days              Patient currently is not medically stable to d/c.   Consultants:   GI  General surgery  Neurology  Procedures: Multiple  Antimicrobials: Zosyn--Augmentin finishing on 8/17   Subjective:  Eating drinking well no chest pain no fever Tolerating regular diet it appears at the bedside  Objective: Vitals:   06/30/20 0515 06/30/20 0800 06/30/20 1222 06/30/20 1250  BP: (!) 118/45 (!) 118/46 (!) 112/50 (!) 111/50  Pulse: 68 72 71 75  Resp: 20  20   Temp: 98.2 F (36.8 C)     TempSrc:      SpO2: 99%  98% 95%  Weight: 93.8 kg     Height:        Intake/Output Summary (Last 24 hours) at 06/30/2020 1426 Last data filed at 06/30/2020 1230 Gross per 24 hour  Intake 789.1 ml  Output --  Net 789.1 ml   Filed Weights   06/28/20 0704 06/29/20 0500 06/30/20 0515  Weight: 99.3 kg 94 kg 93.8 kg    Examination:    General exam: Pleasant slightly hard of hearing Respiratory system:  Clear no rales no rhonchi Cardiovascular system: S1-S2 no murmur paced rhythm pacemaker in place-patient also has a central line in the right neck Gastrointestinal system: Did not examine wound today Central nervous system: Intact no focal deficit moving 4 limbs relatively well Extremities: ROM intact no other issue Skin: Lessening edema in lower extremities however upper bilateral extremities have grade 1 pitting edema Psychiatry: Euthymic pleasant  Data Reviewed: I have personally reviewed following labs and imaging studies No labs today   Radiology Studies: No results found.   Scheduled Meds:  (feeding supplement) PROSource Plus  30 mL Oral BID BM   acetaminophen  1,000 mg Oral Q6H   amoxicillin-clavulanate  1 tablet Oral BID   Chlorhexidine Gluconate Cloth  6 each Topical Daily   feeding supplement  1 Container Oral TID BM   [START ON 07/01/2020] furosemide  20 mg Oral Daily   midodrine  5 mg Oral TID WC   multivitamin with minerals  1 tablet Oral Daily   pantoprazole  40 mg Oral Daily   sodium bicarbonate  650 mg Oral BID   sodium chloride flush  3 mL Intravenous Q12H   Continuous Infusions:    LOS: 19 days    Time spent: [place], MD [facility] To contact the attending provider between 7A-7P or the covering provider during after hours 7P-7A, please log into the web site [email or web address] and access using universal [place] password for that web site. If you do not have the password, please call the hospital operator.  06/30/2020, 2:26 PM

## 2020-06-30 NOTE — Progress Notes (Signed)
Occupational Therapy Treatment Patient Details Name: [name] MRN: [id number] DOB: [birth year] Today's Date: 06/30/2020    History of present illness 86 yo female admitted with UTI SIRS. S/P cystoscopy, L double J stent placement 06/11/20. S/P R colectomy 06/22/20. Hx of Afib, CKD, anemia, CHF, NSVT, pacemaker, chronic back pain, spinal stenosis, orthostatic hypotension   OT comments  Patient supine in bed when therapist entered the room. Patient mod assist for transfer to side of bed and min guard to transfer to [facility]. Patient able to assist with pericare with toileting while therapist assisted with cleaning buttocks and back of legs. Patient's upper extremities edematous and therapist had patient perform upper extremity ROM exercises to promote edema reduction and improve ROM and strength. Patient tolerated well. Patient provided with water balloon to squeeze to reduce hand edema. Patient verbalized understanding of instruction. Continue to recommend SNF at discharge.    Follow Up Recommendations  SNF    Equipment Recommendations  None recommended by OT    Recommendations for Other Services      Precautions / Restrictions Precautions Precautions: Fall Precaution Comments: loose stools Restrictions Weight Bearing Restrictions: No       Mobility Bed Mobility Overal bed mobility: Needs Assistance Bed Mobility: Supine to Sit     Supine to sit: HOB elevated;Mod assist     General bed mobility comments: Mod assist for trunk negotiation and for assistance with pivoting hips to edge of bed.  Transfers   Equipment used: Conservation officer, nature (2 wheeled) Transfers: Sit to/from [employer] to Stand: Min guard         General transfer comment: Assisted to BSc without RW and then to recliner with RW. Min guard for all.    Balance Overall balance assessment: Needs assistance Sitting-balance support: No upper extremity supported;Feet supported Sitting  balance-Leahy Scale: Good     Standing balance support: Single extremity supported;During functional activity Standing balance-Leahy Scale: Poor Standing balance comment: one hand on walker for pericare.                           ADL either performed or assessed with clinical judgement   ADL Overall ADL's : Needs assistance/impaired     Grooming: Wash/dry hands;Set up;Sitting Grooming Details (indicate cue type and reason): Patient washed hands seated in recliner with set up. Upper Body Bathing: Moderate assistance;Sitting;Standing               Toilet Transfer: Min Public librarian Details (indicate cue type and reason): Patient pivoted to [facility] with min guard. Toileting- Clothing Manipulation and Hygiene: Moderate assistance;Sit to/from stand Toileting - Clothing Manipulation Details (indicate cue type and reason): Patient able to assist with toileting. Patient able to wipe self in front and therapist assisted with buttocks and back of legs. Patient needed to stabilize self on RW with one hand to perform task.     Functional mobility during ADLs: Min guard;Rolling walker       Vision   Vision Assessment?: No apparent visual deficits   Perception     Praxis      Cognition Arousal/Alertness: Awake/alert Behavior During Therapy: WFL for tasks assessed/performed Overall Cognitive Status: Within Functional Limits for tasks assessed                                          Exercises  Other Exercises Other Exercises: Fist Pumps x 20, Wrist Extension/Flexion x 20, Forearm Supination/Pronation x 20, Elbow Flexion x 20, Shoulder Flexion x 20 (reps for each arm) Other Exercises: Provided with water squeeze ball to promote fist pumps to reduce edema in hands.   Shoulder Instructions       General Comments      Pertinent Vitals/ Pain       Pain Assessment: Faces Faces Pain Scale: Hurts a little bit Pain Location:  abdomen Pain Descriptors / Indicators: Discomfort Pain Intervention(s): Monitored during session  Home Living                                          Prior Functioning/Environment              Frequency  Min 2X/week        Progress Toward Goals  OT Goals(current goals can now be found in the care plan section)  Progress towards OT goals: Progressing toward goals     Plan Discharge plan remains appropriate    Co-evaluation                 AM-PAC OT "6 Clicks" Daily Activity     Outcome Measure   Help from another person eating meals?: None Help from another person taking care of personal grooming?: A Little Help from another person toileting, which includes using toliet, bedpan, or urinal?: A Lot Help from another person bathing (including washing, rinsing, drying)?: A Lot Help from another person to put on and taking off regular upper body clothing?: A Little Help from another person to put on and taking off regular lower body clothing?: A Lot 6 Click Score: 16    End of Session Equipment Utilized During Treatment: Rolling walker  OT Visit Diagnosis: Other abnormalities of gait and mobility (R26.89);Other symptoms and signs involving cognitive function   Activity Tolerance Patient tolerated treatment well   Patient Left in chair;with call bell/phone within reach;with chair alarm set   Nurse Communication  (okay to see per RN)        Time: 9622-2979 OT Time Calculation (min): 26 min  Charges: OT General Charges $OT Visit: 1 Visit OT Treatments $Self Care/Home Management : 8-22 mins $Therapeutic Exercise: 8-22 mins  Derl Barrow, OTR/L [place]  Office [phone] Pager: [place] 06/30/2020, 5:02 PM

## 2020-06-30 NOTE — Progress Notes (Signed)
8 Days Post-Op   Subjective/Chief Complaint: Comfortable this morning Denies abdominal pain   Objective: Vital signs in last 24 hours: Temp:  [97.5 F (36.4 C)-98.2 F (36.8 C)] 98.2 F (36.8 C) (08/14 0515) Pulse Rate:  [68-72] 72 (08/14 0800) Resp:  [20] 20 (08/14 0515) BP: (100-118)/(45-70) 118/46 (08/14 0800) SpO2:  [97 %-99 %] 99 % (08/14 0515) Weight:  [93.8 kg] 93.8 kg (08/14 0515) Last BM Date: 06/29/20  Intake/Output from previous day: 08/13 0701 - 08/14 0700 In: 669.1 [P.O.:480; I.V.:189.1] Out: -  Intake/Output this shift: No intake/output data recorded.  Exam: Awake and alert Abdomen soft, wound clean, no drainage this morning  Lab Results:  Recent Labs    06/28/20 0355 06/29/20 0335  WBC 7.2 6.2  HGB 7.3* 7.5*  HCT 25.2* 26.0*  PLT 157 169   BMET Recent Labs    06/28/20 0355 06/29/20 0335  NA 140 143  K 3.8 3.6  CL 112* 114*  CO2 19* 19*  GLUCOSE 88 83  BUN 19 23  CREATININE 1.78* 1.90*  CALCIUM 7.4* 7.5*   PT/INR No results for input(s): LABPROT, INR in the last 72 hours. ABG No results for input(s): PHART, HCO3 in the last 72 hours.  Invalid input(s): PCO2, PO2  Studies/Results: No results found.  Anti-infectives: Anti-infectives (From admission, onward)   Start     Dose/Rate Route Frequency Ordered Stop   06/28/20 1415  amoxicillin-clavulanate (AUGMENTIN) 500-125 MG per tablet 500 mg     Discontinue     1 tablet Oral 2 times daily 06/28/20 1316 07/04/20 0959   06/24/20 2200  piperacillin-tazobactam (ZOSYN) IVPB 3.375 g  Status:  Discontinued        3.375 g 12.5 mL/hr over 240 Minutes Intravenous Every 8 hours 06/24/20 2056 06/28/20 1316   06/22/20 0911  sodium chloride 0.9 % with cefoTEtan (CEFOTAN) ADS Med       Note to Pharmacy: Georgena Spurling   : cabinet override      06/22/20 0911 06/22/20 1020   06/21/20 1115  cefoTEtan (CEFOTAN) 2 g in sodium chloride 0.9 % 100 mL IVPB        2 g 200 mL/hr over 30 Minutes Intravenous On  call to O.R. 06/20/20 1346 06/22/20 0559   06/20/20 2200  amoxicillin (AMOXIL) capsule 500 mg  Status:  Discontinued        500 mg Oral Every 8 hours 06/20/20 1950 06/24/20 0725   06/14/20 1000  amoxicillin (AMOXIL) capsule 500 mg        500 mg Oral Every 12 hours 06/14/20 0741 06/19/20 0959   06/11/20 1030  cefTRIAXone (ROCEPHIN) 1 g in sodium chloride 0.9 % 100 mL IVPB  Status:  Discontinued        1 g 200 mL/hr over 30 Minutes Intravenous Every 24 hours 06/11/20 1021 06/14/20 0741   06/11/20 0300  cefTRIAXone (ROCEPHIN) 1 g in sodium chloride 0.9 % 100 mL IVPB        1 g 200 mL/hr over 30 Minutes Intravenous  Once 06/11/20 0259 06/11/20 0458      Assessment/Plan: s/p Procedure(s): LAPAROSCOPIC RIGHT COLECTOMY (Right)  - S/p Lap Right Hemicolectomy 8/5 Dr Doloris Hall #5 Cecal Mass -adenocarcinoma - S/p Colonoscopy 06/18/20, Dr. Michail Sermon >>path: adenocarcinoma - CT chest with possible metastatic lesion in the right lung apex  - [id number] (8/3)  Continue diet and reglan Encourage po SNF       LOS: 19 days    Coralie Keens 06/30/2020

## 2020-07-01 ENCOUNTER — Inpatient Hospital Stay (HOSPITAL_COMMUNITY): Payer: Medicare Other

## 2020-07-01 DIAGNOSIS — N390 Urinary tract infection, site not specified: Secondary | ICD-10-CM | POA: Diagnosis not present

## 2020-07-01 LAB — CBC WITH DIFFERENTIAL/PLATELET
Abs Immature Granulocytes: 0.07 10*3/uL (ref 0.00–0.07)
Basophils Absolute: 0 10*3/uL (ref 0.0–0.1)
Basophils Relative: 0 %
Eosinophils Absolute: 0.2 10*3/uL (ref 0.0–0.5)
Eosinophils Relative: 3 %
HCT: 25.7 % — ABNORMAL LOW (ref 36.0–46.0)
Hemoglobin: 7.3 g/dL — ABNORMAL LOW (ref 12.0–15.0)
Immature Granulocytes: 1 %
Lymphocytes Relative: 20 %
Lymphs Abs: 1.3 10*3/uL (ref 0.7–4.0)
MCH: 26 pg (ref 26.0–34.0)
MCHC: 28.4 g/dL — ABNORMAL LOW (ref 30.0–36.0)
MCV: 91.5 fL (ref 80.0–100.0)
Monocytes Absolute: 0.6 10*3/uL (ref 0.1–1.0)
Monocytes Relative: 10 %
Neutro Abs: 4.1 10*3/uL (ref 1.7–7.7)
Neutrophils Relative %: 66 %
Platelets: 166 10*3/uL (ref 150–400)
RBC: 2.81 MIL/uL — ABNORMAL LOW (ref 3.87–5.11)
RDW: 20.2 % — ABNORMAL HIGH (ref 11.5–15.5)
WBC: 6.2 10*3/uL (ref 4.0–10.5)
nRBC: 0 % (ref 0.0–0.2)

## 2020-07-01 LAB — BASIC METABOLIC PANEL
Anion gap: 11 (ref 5–15)
BUN: 22 mg/dL (ref 8–23)
CO2: 17 mmol/L — ABNORMAL LOW (ref 22–32)
Calcium: 7.4 mg/dL — ABNORMAL LOW (ref 8.9–10.3)
Chloride: 117 mmol/L — ABNORMAL HIGH (ref 98–111)
Creatinine, Ser: 1.96 mg/dL — ABNORMAL HIGH (ref 0.44–1.00)
GFR calc Af Amer: 25 mL/min — ABNORMAL LOW (ref >60)
GFR calc non Af Amer: 22 mL/min — ABNORMAL LOW (ref >60)
Glucose, Bld: 85 mg/dL (ref 70–99)
Potassium: 3.2 mmol/L — ABNORMAL LOW (ref 3.5–5.1)
Sodium: 145 mmol/L (ref 135–145)

## 2020-07-01 LAB — MAGNESIUM: Magnesium: 2 mg/dL (ref 1.7–2.4)

## 2020-07-01 MED ORDER — POTASSIUM CHLORIDE 10 MEQ/100ML IV SOLN
10.0000 meq | INTRAVENOUS | Status: AC
  Administered 2020-07-01 (×3): 10 meq via INTRAVENOUS
  Filled 2020-07-01 (×3): qty 100

## 2020-07-01 NOTE — Progress Notes (Signed)
9 Days Post-Op   Subjective/Chief Complaint: Had emesis yesterday Still having BM's Denies abdominal pain    Objective: Vital signs in last 24 hours: Temp:  [97.5 F (36.4 C)-97.9 F (36.6 C)] 97.9 F (36.6 C) (08/15 0556) Pulse Rate:  [68-75] 70 (08/15 0556) Resp:  [18-25] 18 (08/15 0556) BP: (95-118)/(40-53) 95/40 (08/15 0556) SpO2:  [95 %-100 %] 98 % (08/15 0556) Weight:  [87.3 kg] 87.3 kg (08/15 0331) Last BM Date: 06/30/20  Intake/Output from previous day: 08/14 0701 - 08/15 0700 In: 617 [P.O.:480; IV Piggyback:137] Out: -  Intake/Output this shift: No intake/output data recorded.  Exam: Awake, follows commands Abdomen soft, obese, non-tender, incision with some clear drainage. I can not feel a fascial defect through the open area  Lab Results:  Recent Labs    06/29/20 0335 07/01/20 0535  WBC 6.2 6.2  HGB 7.5* 7.3*  HCT 26.0* 25.7*  PLT 169 166   BMET Recent Labs    06/29/20 0335 07/01/20 0535  NA 143 145  K 3.6 3.2*  CL 114* 117*  CO2 19* 17*  GLUCOSE 83 85  BUN 23 22  CREATININE 1.90* 1.96*  CALCIUM 7.5* 7.4*   PT/INR No results for input(s): LABPROT, INR in the last 72 hours. ABG No results for input(s): PHART, HCO3 in the last 72 hours.  Invalid input(s): PCO2, PO2  Studies/Results: No results found.  Anti-infectives: Anti-infectives (From admission, onward)   Start     Dose/Rate Route Frequency Ordered Stop   06/28/20 1415  amoxicillin-clavulanate (AUGMENTIN) 500-125 MG per tablet 500 mg     Discontinue     1 tablet Oral 2 times daily 06/28/20 1316 07/04/20 0959   06/24/20 2200  piperacillin-tazobactam (ZOSYN) IVPB 3.375 g  Status:  Discontinued        3.375 g 12.5 mL/hr over 240 Minutes Intravenous Every 8 hours 06/24/20 2056 06/28/20 1316   06/22/20 0911  sodium chloride 0.9 % with cefoTEtan (CEFOTAN) ADS Med       Note to Pharmacy: Georgena Spurling   : cabinet override      06/22/20 0911 06/22/20 1020   06/21/20 1115  cefoTEtan  (CEFOTAN) 2 g in sodium chloride 0.9 % 100 mL IVPB        2 g 200 mL/hr over 30 Minutes Intravenous On call to O.R. 06/20/20 1346 06/22/20 0559   06/20/20 2200  amoxicillin (AMOXIL) capsule 500 mg  Status:  Discontinued        500 mg Oral Every 8 hours 06/20/20 1950 06/24/20 0725   06/14/20 1000  amoxicillin (AMOXIL) capsule 500 mg        500 mg Oral Every 12 hours 06/14/20 0741 06/19/20 0959   06/11/20 1030  cefTRIAXone (ROCEPHIN) 1 g in sodium chloride 0.9 % 100 mL IVPB  Status:  Discontinued        1 g 200 mL/hr over 30 Minutes Intravenous Every 24 hours 06/11/20 1021 06/14/20 0741   06/11/20 0300  cefTRIAXone (ROCEPHIN) 1 g in sodium chloride 0.9 % 100 mL IVPB        1 g 200 mL/hr over 30 Minutes Intravenous  Once 06/11/20 0259 06/11/20 0458      Assessment/Plan: s/p Procedure(s): LAPAROSCOPIC RIGHT COLECTOMY (Right)  Continue wound care Will check abdominal plain films today May need to CT if drainage persists to look for fascial defect or other problem May need to open the wound further  LOS: 20 days    Coralie Keens 07/01/2020

## 2020-07-01 NOTE — Progress Notes (Signed)
PROGRESS NOTE    [name]  TSV:[id number] DOB: December 30, 1925 DOA: 06/11/2020 PCP: [name], MD  Brief Narrative:  51 fem tachybradycardia syndrome + PPM 2007 Group 2 pulmonary HTN Severe MR status post repair 2003 Chronic atrial fibrillation CHADS2 score >4 not on anticoagulation due to GI bleed Chronic hypotension on midodrine 3 times daily Presbyesophagus and gastritis on recent endoscopy 06/05/2020 Dr. Rosalie Gums Gout  Initially presented to ED 06/11/2020 with possible right dysuria RUQ pain T-max 99-blood pressures's 90s BUNs/creatinine up from baseline 1.5-63/3.1 Hemoglobin 7.9 FOBT positive CT abdomen moderate hydronephrosis-urology was consulted-stent placed 7/26-urine eventually grew lactobacillus and was transitioned eventually to oral Amoxil However additionally CT abdomen showed cecal mass concerning for malignancy-colonoscopy revealed 8/2   General surgery performed surgery 8/5-pathology = 7 cm adenocarcinoma    involving cecum invading into subserosal area without      lymphovascular or perineural invasion 20 lymph nodes were negative for CA-CEA was 575 on 07/19/3299   course complicated by possible ileus in addition to several dark watery stools for which Gen surg revisited the plan of care and added Reglan for the same Patient has had some oozing from the bottom of the wound but overall has improved She has been on a solid diet reliably since 8/14  And is nearing discharge   Assessment & Plan:   Principal Problem:   Acute lower UTI Active Problems:   Liver cirrhosis (HCC)   CKD (chronic kidney disease) stage 3, GFR 30-59 ml/min   Anemia   Biventricular cardiac pacemaker in situ   Permanent atrial fibrillation (HCC)   Chronic diastolic CHF (congestive heart failure) (HCC)   Gout   S/P MVR (mitral valve repair)   AKI (acute kidney injury) ([place])   Orthostatic hypotension dysautonomic syndrome ([place])   GI bleeding   Obstructive uropathy   Sepsis secondary to  UTI ([place])   Hydronephrosis of left kidney   Left ureteral stone   Advanced care planning/counseling discussion   Goals of care, counseling/discussion   Palliative care by specialist   DNR (do not resuscitate)   Acute on chronic renal insufficiency   Iron deficiency anemia due to chronic blood loss   Cancer of cecum (HCC)   Emesis   Gastritis with hemorrhage   1. 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 Episodic emesis x1 overnight tolerating soft diet b. Continue OxyIR low-dose 2.5 every 4 as needed with sparing amounts of Dilaudid c. IV fluids discontinued 813 2. Obstructive uropathy left-sided hydronephrosis + left ureteric stone and stent placement 06/11/2020 a. defer further definitive management of urinary stone to urologist Dr. Jeffie Pollock as an outpatient-we will CC him prior to discharge b. Definitive management as per them 3. Sepsis secondary to lactobacillus urinary infection a. Probable aspiration 8/8 resulting in broadening of antibiotics i. Zosyn since 8/8 and have deescalated to p.o. Augmentin 8/12 to finish on 8/17 ii. Periodic chest x-ray as as needed 4. Combined systolic diastolic heart failure  5. chronic hypotension on midodrine 3 times daily 6. Pulmonary hypertension group 2 a. Not on beta-blocker or ARB ACE probably because of chronic hypotension b. Usually on Demadex 20 daily at home-placed on Lasix 20 to gently diurese c. Fluid status +6 L weight down however from 94 to 87 kg d. Continuing midodrine 5 mg 3 times daily 7. Permanent atrial fibrillation/tachybradycardia syndrome with PPM  a. Not on anticoagulation secondary to GI bleed CHADS2 score >4 i. Has an underlying paced rhythm ii. Metoprolol 5 mg every  6 as needed high blood pressure no tachyarrhythmia but historically has not had this for the past several days she remains in a paced rhythm iii. Not on oral meds as above and not on anticoagulation as above 8. Hypernatremia is relatively  resolved a. CKD 3B i. Creatinine variable GFR ranges between 25 and 23 ii. Continue bicarb 650 twice daily 9. Hypokalemia a. Replacing with 3 runs of potassium today and monitor trends including magnesium tomorrow b. This is likely secondary to diuresis 10. Iron deficiency anemia + anemia of renal disease  a. status post 1 unit PRBC this admission  i. Received Feraheme 8/6 ii. Given hemoglobin is in the 7 range-redosed Feraheme again 8/14 iii. Needs iron studies in about 2 to 3 weeks at facility 11. Acute superimposed chronic kidney disease stage IIIb a. See above discussion regarding fluid status  DVT prophylaxis: SCD Code Status: Full CODE STATUS Family Communication: d/w  grandson at the bedside Disposition: She is not ready for discharge yet because she had vomiting and she still is oozing from her lower abdomen she needs a slightly negative balance I expect within the next 2 to 3 days she may be able to discharge  Status is: Inpatient  Remains inpatient appropriate because:Persistent severe electrolyte disturbances, Ongoing active pain requiring inpatient pain management, Altered mental status and IV treatments appropriate due to intensity of illness or inability to take PO   Dispo: The patient is from: Home              Anticipated d/c is to: SNF              Anticipated d/c date is: 2 days              Patient currently is not medically stable to d/c.   Consultants:   GI  General surgery  Neurology  Procedures: Multiple  Antimicrobials: Zosyn--Augmentin finishing on 8/17   Subjective:  Vomited this morning No chest pain Passing some stool Bruising all over Area on the right arm seems to have opened up with bruising She is at a modest to some degree but has not had vomiting since this a.m. We are trying to avoid Reglan because of antihistamine anticholinergic effects but may need to go back to it if she continues to vomit   Objective: Vitals:   07/01/20  0331 07/01/20 0556 07/01/20 0815 07/01/20 1242  BP:  (!) 95/40 (!) 109/42 97/78  Pulse:  70 70 70  Resp:  18 (!) 23 18  Temp:  97.9 F (36.6 C) 97.8 F (36.6 C) 98.3 F (36.8 C)  TempSrc:  Oral Oral Oral  SpO2:  98% 99% 100%  Weight: 87.3 kg     Height:        Intake/Output Summary (Last 24 hours) at 07/01/2020 1524 Last data filed at 07/01/2020 1000 Gross per 24 hour  Intake 240 ml  Output --  Net 240 ml   Filed Weights   06/29/20 0500 06/30/20 0515 07/01/20 0331  Weight: 94 kg 93.8 kg 87.3 kg    Examination:  General exam: Hard of hearing pleasant Respiratory system: Clear no rales no rhonchi Cardiovascular system: S1-S2 no murmur paced rhythm pacemaker in place-patient also has a central line in the right neck Gastrointestinal system: I looked at wound today at the bottom of her staples and it is clean with clear margins although she does have serous fluid leaking Central nervous system: Intact no focal deficit moving 4 limbs relatively well Extremities:  ROM intact no other issue Skin: Less edema is less in her lower extremities however in her upper extremity she still has some puffiness On her right forearm on the extensor aspect she has an area that opened up and bled a little bit Psychiatry: Euthymic pleasant  Data Reviewed: I have personally reviewed following labs and imaging studies  Potassium 3.2 bicarb 17 BUN/creatinine 22/1.9 Hemoglobin 7.3 platelet 166 WBC 6.2 Her x-ray today shows nonobstructive bowel pattern Radiology Studies: DG Abd Portable 1V  Result Date: 07/01/2020 CLINICAL DATA:  Post right colectomy 9 days ago.  Emesis. EXAM: PORTABLE ABDOMEN - 1 VIEW COMPARISON:  06/28/2020 FINDINGS: Surgical clips over the right upper quadrant. Skin staples are present vertically over the left mid to lower abdomen. Double-J left-sided internal ureteral stent is unchanged. Bowel gas pattern demonstrates air throughout the colon. No free peritoneal air. Resolution of  previously seen mildly dilated small bowel loops. Remainder of the exam is unchanged. IMPRESSION: 1. Nonobstructive bowel gas pattern. 2. Left-sided double-J internal ureteral stent unchanged. Electronically Signed   By: Marin Olp M.D.   On: 07/01/2020 08:57     Scheduled Meds: . (feeding supplement) PROSource Plus  30 mL Oral BID BM  . acetaminophen  1,000 mg Oral Q6H  . amoxicillin-clavulanate  1 tablet Oral BID  . Chlorhexidine Gluconate Cloth  6 each Topical Daily  . feeding supplement  1 Container Oral TID BM  . furosemide  20 mg Oral Daily  . midodrine  5 mg Oral TID WC  . multivitamin with minerals  1 tablet Oral Daily  . pantoprazole  40 mg Oral Daily  . sodium bicarbonate  650 mg Oral BID  . sodium chloride flush  3 mL Intravenous Q12H   Continuous Infusions:    LOS: 20 days    Time spent: [place], MD [facility] To contact the attending provider between 7A-7P or the covering provider during after hours 7P-7A, please log into the web site [email or web address] and access using universal [place] password for that web site. If you do not have the password, please call the hospital operator.  07/01/2020, 3:24 PM

## 2020-07-02 ENCOUNTER — Inpatient Hospital Stay (HOSPITAL_COMMUNITY): Payer: Medicare Other

## 2020-07-02 LAB — CBC WITH DIFFERENTIAL/PLATELET
Abs Immature Granulocytes: 0.12 10*3/uL — ABNORMAL HIGH (ref 0.00–0.07)
Basophils Absolute: 0 10*3/uL (ref 0.0–0.1)
Basophils Relative: 0 %
Eosinophils Absolute: 0.2 10*3/uL (ref 0.0–0.5)
Eosinophils Relative: 3 %
HCT: 26.3 % — ABNORMAL LOW (ref 36.0–46.0)
Hemoglobin: 7.6 g/dL — ABNORMAL LOW (ref 12.0–15.0)
Immature Granulocytes: 2 %
Lymphocytes Relative: 22 %
Lymphs Abs: 1.5 10*3/uL (ref 0.7–4.0)
MCH: 26.4 pg (ref 26.0–34.0)
MCHC: 28.9 g/dL — ABNORMAL LOW (ref 30.0–36.0)
MCV: 91.3 fL (ref 80.0–100.0)
Monocytes Absolute: 0.8 10*3/uL (ref 0.1–1.0)
Monocytes Relative: 11 %
Neutro Abs: 4.3 10*3/uL (ref 1.7–7.7)
Neutrophils Relative %: 62 %
Platelets: 160 10*3/uL (ref 150–400)
RBC: 2.88 MIL/uL — ABNORMAL LOW (ref 3.87–5.11)
RDW: 21 % — ABNORMAL HIGH (ref 11.5–15.5)
WBC: 6.9 10*3/uL (ref 4.0–10.5)
nRBC: 0.3 % — ABNORMAL HIGH (ref 0.0–0.2)

## 2020-07-02 LAB — BASIC METABOLIC PANEL
Anion gap: 10 (ref 5–15)
BUN: 21 mg/dL (ref 8–23)
CO2: 16 mmol/L — ABNORMAL LOW (ref 22–32)
Calcium: 7.3 mg/dL — ABNORMAL LOW (ref 8.9–10.3)
Chloride: 118 mmol/L — ABNORMAL HIGH (ref 98–111)
Creatinine, Ser: 2.05 mg/dL — ABNORMAL HIGH (ref 0.44–1.00)
GFR calc Af Amer: 24 mL/min — ABNORMAL LOW (ref >60)
GFR calc non Af Amer: 21 mL/min — ABNORMAL LOW (ref >60)
Glucose, Bld: 78 mg/dL (ref 70–99)
Potassium: 3.6 mmol/L (ref 3.5–5.1)
Sodium: 144 mmol/L (ref 135–145)

## 2020-07-02 LAB — URINALYSIS, COMPLETE (UACMP) WITH MICROSCOPIC
Bilirubin Urine: NEGATIVE
Glucose, UA: NEGATIVE mg/dL
Ketones, ur: NEGATIVE mg/dL
Nitrite: NEGATIVE
Protein, ur: 30 mg/dL — AB
RBC / HPF: >50 RBC/hpf — ABNORMAL HIGH (ref 0–5)
Specific Gravity, Urine: 1.019 (ref 1.005–1.030)
WBC, UA: >50 WBC/hpf — ABNORMAL HIGH (ref 0–5)
pH: 6 (ref 5.0–8.0)

## 2020-07-02 LAB — MAGNESIUM: Magnesium: 1.9 mg/dL (ref 1.7–2.4)

## 2020-07-02 MED ORDER — IOHEXOL 9 MG/ML PO SOLN
500.0000 mL | ORAL | Status: AC
  Administered 2020-07-02 (×2): 500 mL via ORAL

## 2020-07-02 MED ORDER — IOHEXOL 9 MG/ML PO SOLN
ORAL | Status: AC
  Filled 2020-07-02: qty 1000

## 2020-07-02 NOTE — Progress Notes (Addendum)
10 Days Post-Op    CC: Abdominal pain  Subjective: Patient sitting up in bed eating breakfast she is cleared off most of her plate.  She denies any nausea.  I took down the abdominal wound and check stitches.  She opens up when she is draining fluid from the very base of the incision.  Objective: Vital signs in last 24 hours: Temp:  [97.5 F (36.4 C)-98.3 F (36.8 C)] 97.5 F (36.4 C) (08/16 0605) Pulse Rate:  [68-70] 68 (08/16 0605) Resp:  [18] 18 (08/16 0605) BP: (97-115)/(33-78) 111/51 (08/16 0605) SpO2:  [98 %-100 %] 98 % (08/16 0605) Weight:  [87.1 kg] 87.1 kg (08/16 0359) Last BM Date: 07/01/20 360 p.o. 305 IV Voided x6 BM x6 Afebrile vital signs are stable. Creatinine 2.05 K+ 3.6/mag 1.9 WBC 6.9 H/H 7.6/26.3 Platelets 160,000 DG abdomen yesterday: Nonobstructive gas pattern, left-sided double-J internal ureteral stent, unchanged.  Intake/Output from previous day: 08/15 0701 - 08/16 0700 In: 665.8 [P.O.:360; IV Piggyback:305.8] Out: -  Intake/Output this shift: No intake/output data recorded.  General appearance: alert, cooperative, no distress and She cannot remember too much but seems to be doing fairly well this AM. Resp: clear to auscultation bilaterally GI: Soft large abdomen.  She is tolerating diet.  She is having bowel movements.  I took the remaining staples out of the midline wound.  Separated.  There are some hematoma although wall.  There is no necrosis.  She has fluid collecting at the very base of the incision.   Lab Results:  Recent Labs    07/01/20 0535 07/02/20 0409  WBC 6.2 6.9  HGB 7.3* 7.6*  HCT 25.7* 26.3*  PLT 166 160    BMET Recent Labs    07/01/20 0535 07/02/20 0409  NA 145 144  K 3.2* 3.6  CL 117* 118*  CO2 17* 16*  GLUCOSE 85 78  BUN 22 21  CREATININE 1.96* 2.05*  CALCIUM 7.4* 7.3*   PT/INR No results for input(s): LABPROT, INR in the last 72 hours.  Recent Labs  Lab 06/28/20 0355  AST 14*  ALT 11  ALKPHOS  102  BILITOT 0.5  PROT 4.5*  ALBUMIN 1.9*     Lipase     Component Value Date/Time   LIPASE 27 06/11/2020 0123     Medications: . (feeding supplement) PROSource Plus  30 mL Oral BID BM  . acetaminophen  1,000 mg Oral Q6H  . amoxicillin-clavulanate  1 tablet Oral BID  . Chlorhexidine Gluconate Cloth  6 each Topical Daily  . feeding supplement  1 Container Oral TID BM  . furosemide  20 mg Oral Daily  . midodrine  5 mg Oral TID WC  . multivitamin with minerals  1 tablet Oral Daily  . pantoprazole  40 mg Oral Daily  . sodium bicarbonate  650 mg Oral BID  . sodium chloride flush  3 mL Intravenous Q12H    Assessment/Plan SIRS secondary to UTI/GI bleed Tachy-Brady syndrome - PTVP 11/2014 Severe MR s/p repair with 28 mm annuloplasty ring/oversew LAA, 2003 Hx NSVT/chronic atrial fibrillation-not on anticoagulation secondary to GI bleed Hx CHF Hx pulmonary hypertension Anemia   -H/H 7.6/26.3 Hx CKD with AKI - creatinine is up 1.18>>1.38>>1.64>>1.67 >> 1.74(8/11)>>2.05(8/16) Hx gastritis Hx recent balloon dilatation [name] Hx spinal stenosis Hx hypertension/hypertension-on midodrine at home Hydronephrosis with cystoscopy/left retrograde pyelogram/insertion of left double-J stent 06/11/2020;Dr. Jeffie Pollock Hx remote tobacco use Malnutrition - prealbumin 11 (8/3)  Cecal Mass -adenocarcinoma - S/p Colonoscopy 06/18/20, Dr. Michail Sermon >>  path: adenocarcinoma - CT chest with possible metastatic lesion in the right lung apex  - [id number] (8/3) - S/p Lap Right Hemicolectomy 8/5 Dr Toth-POD # 11 - Path: Invasive moderately differentiated mucinous adenocarcinoma. - Med Onc has seen here in the hospital and reports they have arranged outpt follow up -Postop ileus resolving. Xray 8/12 with cont dilated loops of bowel but clinically improving (abdominal pain resolved, tolerating cld without n/v, passing flatus, having bm's, normoactive bs on exam). Will adv toreg diet and try Reglan  prior to meals. - BID WTD to open portion of wound.  - Mobilize, Pulm toilet  - PT/OT, recommending SNF  FEN: soft diet and tolerating it well ID: No further abx indicated from a general surgery standpoint. On Zosyn for PNA per [facility] Augmentin 8/12 >> for UTI DVT: SCD's, ok for chemical DVT prophylaxis from surgical standpoint  Plan:  I have opened the wound up and packed dry for now.  Wet to dry dressing later.  Abdominal binder.  I am also worried about a fascial defect.  Will discuss CT with Dr. Johney Maine.  We will get CT with just oral contrast. She needs assistance for bed mobility rolling walker for transfers from sit to stand and pivots.  She is severely deconditioned she has a Wick in place for urinary incontinence.  CT 8/16:  1. Mild atelectasis and/or infiltrate within the bilateral lung bases and posterior aspect of the left upper lobe. 2. Evidence of prior cholecystectomy. 3. Bilateral nonobstructing renal calculi. 4. Colonic diverticulosis.  - Stomach/Bowel: Stomach is within normal limits. The appendix is not clearly identified. Surgical sutures are seen along the expected region of the distal ascending colon. No evidence of bowel dilatation. Noninflamed diverticula are seen within the large bowel.  Continue wet to dry dressing as planned above.   LOS: 21 days    [place] Police 07/02/2020 Please see Amion

## 2020-07-02 NOTE — Progress Notes (Signed)
Physical Therapy Treatment Patient Details Name: [name] MRN: [id number] DOB: Dec 19, 1925 Today's Date: 07/02/2020    History of Present Illness 86 yo female admitted with UTI SIRS. S/P cystoscopy, L double J stent placement 06/11/20. S/P R colectomy 06/22/20. Hx of Afib, CKD, anemia, CHF, NSVT, pacemaker, chronic back pain, spinal stenosis, orthostatic hypotension    PT Comments    Pt continues to demonstrate general weakness and decreased activity tolerance. ABD wound leaking with activity-made RN aware.    Follow Up Recommendations  SNF     Equipment Recommendations  None recommended by PT    Recommendations for Other Services       Precautions / Restrictions Precautions Precautions: Fall Precaution Comments: loose stools Restrictions Weight Bearing Restrictions: No    Mobility  Bed Mobility Overal bed mobility: Needs Assistance Bed Mobility: Supine to Sit;Sit to Supine     Supine to sit: Mod assist Sit to supine: Mod assist   General bed mobility comments: Assist for trunk, LEs  Transfers Overall transfer level: Needs assistance Equipment used: Rolling walker (2 wheeled) Transfers: Sit to/from Stand Sit to Stand: Min assist         General transfer comment: Assist to power up, control descent. Cues for safety, hand placement.  Ambulation/Gait Ambulation/Gait assistance: Min assist Gait Distance (Feet): 20 Feet Assistive device: Rolling walker (2 wheeled) Gait Pattern/deviations: Step-through pattern;Decreased stride length;Trunk flexed     General Gait Details: Gait distance limited by back pain and dyspnea. Pt fatigues easily. Assist to stabilize pt throughout short distance.   Stairs             Wheelchair Mobility    Modified Rankin (Stroke Patients Only)       Balance Overall balance assessment: Needs assistance         Standing balance support: Bilateral upper extremity supported Standing balance-Leahy Scale: Poor                               Cognition Arousal/Alertness: Awake/alert Behavior During Therapy: WFL for tasks assessed/performed Overall Cognitive Status: Within Functional Limits for tasks assessed                                        Exercises      General Comments        Pertinent Vitals/Pain Pain Assessment: Faces Faces Pain Scale: Hurts little more Pain Location: back Pain Descriptors / Indicators: Discomfort;Sore;Aching Pain Intervention(s): Limited activity within patient's tolerance;Monitored during session;Repositioned    Home Living                      Prior Function            PT Goals (current goals can now be found in the care plan section) Progress towards PT goals: Progressing toward goals    Frequency    Min 2X/week      PT Plan Current plan remains appropriate    Co-evaluation              AM-PAC PT "6 Clicks" Mobility   Outcome Measure  Help needed turning from your back to your side while in a flat bed without using bedrails?: A Lot Help needed moving from lying on your back to sitting on the side of a flat bed without using bedrails?: A Lot Help  needed moving to and from a bed to a chair (including a wheelchair)?: A Little Help needed standing up from a chair using your arms (e.g., wheelchair or bedside chair)?: A Little Help needed to walk in hospital room?: A Little Help needed climbing 3-5 steps with a railing? : Total 6 Click Score: 14    End of Session Equipment Utilized During Treatment: Gait belt Activity Tolerance: Patient limited by fatigue;Patient limited by pain Patient left: in bed;with call bell/phone within reach;with bed alarm set   PT Visit Diagnosis: Difficulty in walking, not elsewhere classified (R26.2);Muscle weakness (generalized) (M62.81)     Time: 1040-1056 PT Time Calculation (min) (ACUTE ONLY): 16 min  Charges:  $Gait Training: 8-22 mins                         Doreatha Massed, PT Acute Rehabilitation  Office: [phone] Pager: [phone]

## 2020-07-02 NOTE — Progress Notes (Signed)
Applied abdominal binder as ordered, patient stated she can not tolerate it to take it off. Abd binder off.

## 2020-07-02 NOTE — Progress Notes (Signed)
PROGRESS NOTE    [name]  WRU:[id number] DOB: 1926/08/19 DOA: 06/11/2020 PCP: [name], MD  Brief Narrative:  89 fem tachybradycardia syndrome + PPM 2007 Group 2 pulmonary HTN Severe MR status post repair 2003 Chronic atrial fibrillation CHADS2 score >4 not on anticoagulation due to GI bleed Chronic hypotension on midodrine 3 times daily Presbyesophagus and gastritis on recent endoscopy 06/05/2020 Dr. Rosalie Gums Gout  Initially presented to ED 06/11/2020 with possible right dysuria RUQ pain T-max 99-blood pressures's 90s BUNs/creatinine up from baseline 1.5-63/3.1 Hemoglobin 7.9 FOBT positive CT abdomen moderate hydronephrosis-urology was consulted-stent placed 7/26-urine eventually grew lactobacillus and was transitioned eventually to oral Amoxil However additionally CT abdomen showed cecal mass concerning for malignancy-colonoscopy revealed 8/2   General surgery performed surgery 8/5-pathology = 7 cm adenocarcinoma involving cecum invading into subserosal area without      lymphovascular or perineural invasion 20 lymph nodes were negative for CA-CEA was 575 on 07/18/4781   course complicated by possible ileus in addition to several dark watery stools for which Gen surg revisited the plan of care and added Reglan for the same Patient has had some oozing from the bottom of the wound but overall has improved She has been on a solid diet reliably since 8/14   Because of some oozing-she had SQ hematoma removed 8/16   Assessment & Plan:   Principal Problem:   Acute lower UTI Active Problems:   Liver cirrhosis (HCC)   CKD (chronic kidney disease) stage 3, GFR 30-59 ml/min   Anemia   Biventricular cardiac pacemaker in situ   Permanent atrial fibrillation (HCC)   Chronic diastolic CHF (congestive heart failure) (HCC)   Gout   S/P MVR (mitral valve repair)   AKI (acute kidney injury) ([place])   Orthostatic hypotension dysautonomic syndrome ([place])   GI bleeding   Obstructive  uropathy   Sepsis secondary to UTI ([place])   Hydronephrosis of left kidney   Left ureteral stone   Advanced care planning/counseling discussion   Goals of care, counseling/discussion   Palliative care by specialist   DNR (do not resuscitate)   Acute on chronic renal insufficiency   Iron deficiency anemia due to chronic blood loss   Cancer of cecum (HCC)   Emesis   Gastritis with hemorrhage   1. 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 tol diet b. Continue OxyIR low-dose 2.5 every 4 as needed with sparing amounts of Dilaudid c. IV fluids discontinued 813 2. Obstructive uropathy left-sided hydronephrosis + left ureteric stone and stent placement 06/11/2020 a. D/w urologist Dr. Jeffie Pollock as an outpatient who requests UA and UC b. Definitive management as per them in several weeks 3. Sepsis secondary to lactobacillus urinary infection a. Probable aspiration 8/8 resulting in broadening of antibiotics i. Zosyn since 8/8 and have deescalated to p.o. Augmentin 8/12 to finish on 8/17 ii. Periodic chest x-ray as as needed 4. Combined systolic diastolic heart failure  5. chronic hypotension on midodrine 3 times daily 6. Pulmonary hypertension group 2 a. Not on beta-blocker or ARB ACE probably because of chronic hypotension b. Usually on Demadex 20 daily at home-placed on Lasix 20 to gently diurese c. Fluid status +6 L weight down however from 94 to 87 kg d. Continuing midodrine 5 mg 3 times daily 7. Permanent atrial fibrillation/tachybradycardia syndrome with PPM  a. Not on anticoagulation secondary to GI bleed CHADS2 score >4 i. Has an underlying paced rhythm ii. Metoprolol 5 mg every 6 as needed high blood pressure no  tachyarrhythmia but historically has not had this for the past several days she remains in a paced rhythm iii. Not on oral meds as above and not on anticoagulation as above 8. Hypernatremia is relatively resolved a. CKD 3B i. Creatinine variable GFR ranges  between 25 and 23 ii. Continue bicarb 650 twice daily 9. Hypokalemia a. resolved b. This is likely secondary to diuresis 10. Iron deficiency anemia + anemia of renal disease  a. status post 1 unit PRBC this admission  i. Received Feraheme 8/6 ii. Given hemoglobin is in the 7 range-redosed Feraheme again 8/14 iii. Needs iron studies in about 2 to 3 weeks at facility 11. Acute superimposed chronic kidney disease stage IIIb a. See above discussion regarding fluid status  DVT prophylaxis: SCD Code Status: Full CODE STATUS Family Communication: d/w  grandson at the bedside Disposition: nearing d/c  Status is: Inpatient  Remains inpatient appropriate because:Persistent severe electrolyte disturbances, Ongoing active pain requiring inpatient pain management, Altered mental status and IV treatments appropriate due to intensity of illness or inability to take PO   Dispo: The patient is from: Home              Anticipated d/c is to: SNF              Anticipated d/c date is: 1 day              Patient currently is not medically stable to d/c.   Consultants:   GI  General surgery  Neurology  Procedures: Multiple  Antimicrobials: Zosyn--Augmentin finishing on 8/17   Subjective:  No further vomit Gen surgery removed sq hematoma No fever chills n/v Passing stool Less swollen  Objective: Vitals:   07/01/20 2155 07/02/20 0359 07/02/20 0605 07/02/20 1453  BP: (!) 102/33  (!) 111/51 (!) 105/44  Pulse: 68  68 72  Resp: 18  18 20   Temp: (!) 97.5 F (36.4 C)  (!) 97.5 F (36.4 C) (!) 97.4 F (36.3 C)  TempSrc: Oral  Oral Oral  SpO2: 98%  98% 100%  Weight:  87.1 kg    Height:        Intake/Output Summary (Last 24 hours) at 07/02/2020 1537 Last data filed at 07/02/2020 1455 Gross per 24 hour  Intake 551.44 ml  Output 100 ml  Net 451.44 ml   Filed Weights   06/30/20 0515 07/01/20 0331 07/02/20 0359  Weight: 93.8 kg 87.3 kg 87.1 kg    Examination:  General exam:  Hard of hearing pleasant Respiratory system: Clear no rales no rhonchi Cardiovascular system: S1-S2 no murmur paced rhythm pacemaker in place-patient also has a central line in the right neck Gastrointestinal system: abd soft-wound not examined today Central nervous system: Intact no focal deficit moving 4 limbs relatively well Extremities: ROM intact no other issue Skin: Less edema is less in her lower extremities  Psychiatry: Euthymic pleasant  Data Reviewed: I have personally reviewed following labs and imaging studies  Potassium 3.2-->3.6  bicarb 16  BUN/creatinine 22/1.9-->21/2.05 Hemoglobin 7.3-->7.6 platelet 166 WBC 6.9  Radiology Studies: CT ABDOMEN PELVIS WO CONTRAST  Result Date: 07/02/2020 CLINICAL DATA:  Abdominal pain. EXAM: CT ABDOMEN AND PELVIS WITHOUT CONTRAST TECHNIQUE: Multidetector CT imaging of the abdomen and pelvis was performed following the standard protocol without IV contrast. COMPARISON:  June 15, 2020 FINDINGS: Lower chest: Mild atelectasis and/or infiltrate is seen within the bilateral lung bases and posterior aspect of the left upper lobe. Multiple sternal wires are noted. Hepatobiliary: No focal liver  abnormality is seen. Status post cholecystectomy. No biliary dilatation. Pancreas: Unremarkable. No pancreatic ductal dilatation or surrounding inflammatory changes. Spleen: A 3.6 cm diameter cyst is seen within the posteromedial aspect of the spleen. Adrenals/Urinary Tract: Adrenal glands are unremarkable. Kidneys are normal in size. 7 mm and 4 mm round hyperdense foci are seen within the anterior aspect of the mid right kidney. A 2 mm nonobstructing renal stone is noted within the upper pole of the right kidney. A 4 mm nonobstructing renal stone is seen within the anterior aspect of the lower pole of the right kidney. A left-sided endo ureteral stent is noted. A 2 mm nonobstructing renal stone is seen within the mid left kidney. Bladder is unremarkable.  Stomach/Bowel: Stomach is within normal limits. The appendix is not clearly identified. Surgical sutures are seen along the expected region of the distal ascending colon. No evidence of bowel dilatation. Noninflamed diverticula are seen within the large bowel. Vascular/Lymphatic: There is marked severity calcification of the abdominal aorta and bilateral common iliac arteries, without evidence of aneurysmal dilatation. 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 No enlarged abdominal or pelvic lymph nodes. Reproductive: Uterus and bilateral adnexa are unremarkable. Other: A surgical defect is seen along the midline of the anterior abdominal wall. No abdominopelvic ascites. Musculoskeletal: Moderate severity degenerative changes seen throughout the lumbar spine with approximately 3 mm retrolisthesis of the L4 vertebral body on L5 and 2 mm retrolisthesis of the L5 vertebral body on S1. IMPRESSION: 1. Mild atelectasis and/or infiltrate within the bilateral lung bases and posterior aspect of the left upper lobe. 2. Evidence of prior cholecystectomy. 3. Bilateral nonobstructing renal calculi. 4. Colonic diverticulosis. Aortic Atherosclerosis (ICD10-I70.0). Electronically Signed   By: Virgina Norfolk M.D.   On: 07/02/2020 15:08   DG Abd Portable 1V  Result Date: 07/01/2020 CLINICAL DATA:  Post right colectomy 9 days ago.  Emesis. EXAM: PORTABLE ABDOMEN - 1 VIEW COMPARISON:  06/28/2020 FINDINGS: Surgical clips over the right upper quadrant. Skin staples are present vertically over the left mid to lower abdomen. Double-J left-sided internal ureteral stent is unchanged. Bowel gas pattern demonstrates air throughout the colon. No free peritoneal air. Resolution of previously seen mildly dilated small bowel loops. Remainder of the exam is unchanged. IMPRESSION: 1. Nonobstructive bowel gas pattern. 2. Left-sided double-J internal ureteral stent unchanged. Electronically Signed   By: Marin Olp M.D.   On:  07/01/2020 08:57     Scheduled Meds:  (feeding supplement) PROSource Plus  30 mL Oral BID BM   acetaminophen  1,000 mg Oral Q6H   amoxicillin-clavulanate  1 tablet Oral BID   Chlorhexidine Gluconate Cloth  6 each Topical Daily   feeding supplement  1 Container Oral TID BM   furosemide  20 mg Oral Daily   iohexol       midodrine  5 mg Oral TID WC   multivitamin with minerals  1 tablet Oral Daily   pantoprazole  40 mg Oral Daily   sodium bicarbonate  650 mg Oral BID   sodium chloride flush  3 mL Intravenous Q12H   Continuous Infusions:    LOS: 21 days    Time spent: 25  Nita Sells, MD [facility] To contact the attending provider between 7A-7P or the covering provider during after hours 7P-7A, please log into the web site [email or web address] and access using universal [place] password for that web site. If you do not have the password, please call the hospital operator.  07/02/2020,  3:37 PM

## 2020-07-03 LAB — URINE CULTURE: Culture: NO GROWTH

## 2020-07-03 MED ORDER — METOPROLOL SUCCINATE ER 25 MG PO TB24
12.5000 mg | ORAL_TABLET | Freq: Every day | ORAL | Status: DC
  Administered 2020-07-03 – 2020-07-08 (×6): 12.5 mg via ORAL
  Filled 2020-07-03 (×6): qty 1

## 2020-07-03 NOTE — Progress Notes (Signed)
PROGRESS NOTE    [name]  ZOX:[id number] DOB: 1926-03-22 DOA: 06/11/2020 PCP: [name], MD  Brief Narrative:  63 fem tachybradycardia syndrome + PPM 2007 Group 2 pulmonary HTN Severe MR status post repair 2003 Chronic atrial fibrillation CHADS2 score >4 not on anticoagulation due to GI bleed Chronic hypotension on midodrine 3 times daily Presbyesophagus and gastritis on recent endoscopy 06/05/2020 Dr. Rosalie Gums Gout  Initially presented to ED 06/11/2020 with possible right dysuria RUQ pain T-max 99-blood pressures's 90s BUNs/creatinine up from baseline 1.5-63/3.1 Hemoglobin 7.9 FOBT positive CT abdomen moderate hydronephrosis-urology was consulted-stent placed 7/26-urine eventually grew lactobacillus and was transitioned eventually to oral Amoxil However additionally CT abdomen showed cecal mass concerning for malignancy-colonoscopy revealed 8/2   General surgery performed surgery 8/5-pathology = 7 cm adenocarcinoma involving cecum invading into subserosal area without      lymphovascular or perineural invasion 20 lymph nodes were negative for CA-CEA was 575 on 06/17/1913   course complicated by possible ileus in addition to several dark watery stools for which Gen surg revisited the plan of care and added Reglan for the same Patient has had some oozing from the bottom of the wound but overall has improved She has been on a solid diet reliably since 8/14   Because of some oozing-she had SQ hematoma removed 8/16 and wound VAC has been placed over the base of her incision where this was removed She has had some V. tach and we will be monitoring her over the course of the next d several days prior to discharge  Assessment & Plan:   Principal Problem:   Acute lower UTI Active Problems:   Liver cirrhosis (HCC)   CKD (chronic kidney disease) stage 3, GFR 30-59 ml/min   Anemia   Biventricular cardiac pacemaker in situ   Permanent atrial fibrillation (HCC)   Chronic diastolic  CHF (congestive heart failure) (HCC)   Gout   S/P MVR (mitral valve repair)   AKI (acute kidney injury) ([place])   Orthostatic hypotension dysautonomic syndrome ([place])   GI bleeding   Obstructive uropathy   Sepsis secondary to UTI ([place])   Hydronephrosis of left kidney   Left ureteral stone   Advanced care planning/counseling discussion   Goals of care, counseling/discussion   Palliative care by specialist   DNR (do not resuscitate)   Acute on chronic renal insufficiency   Iron deficiency anemia due to chronic blood loss   Cancer of cecum (HCC)   Emesis   Gastritis with hemorrhage   1. 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 tol diet b. Continue OxyIR low-dose 2.5 every 4 as needed with sparing amounts of Dilaudid c. IV fluids discontinued 813 2. Facial deficit status post hematoma removal from base of wound 8/16 a. Appreciate WOC nurse as well as general surgery further input b. Will have wound VAC placed and reevaluation by general surgery if needed 3. Obstructive uropathy left-sided hydronephrosis + left ureteric stone and stent placement 06/11/2020 a. D/w urologist Dr. Jeffie Pollock 8/16 who recommends UA/UC b. Urine analysis per 8/16 labs show no growth to date c. We will CC Dr. Jeffie Pollock to ensure that she is not lost to follow-up as she will be going to skilled facility 4. Sepsis secondary to lactobacillus urinary infection a. Probable aspiration 8/8 resulting in broadening of antibiotics i. Zosyn since 8/8 and have deescalated to p.o. Augmentin 8/12 to finish on 8/17 ii. Periodic chest x-ray as as needed 5. Combined systolic diastolic heart failure  6.  chronic hypotension on midodrine 3 times daily 7. Pulmonary hypertension group 2 a. Not on beta-blocker or ARB ACE probably because of chronic hypotension b. Home meds Demadex-placed on Lasix 20 to gently diurese c. Fluid status +6 L still, weight inaccurate d. Continuing midodrine 5 mg 3 times daily 8. Permanent  atrial fibrillation/tachybradycardia syndrome with PPM  9. Some nonsustained VT of up to 12 beats on 8/17 a. Not on anticoagulation secondary to GI bleed CHADS2 score >4 i. Has an underlying paced rhythm ii. Started low-dose Toprol-XL 12.5 on 8/17 iii. Check a.m. magnesium iv. Metoprolol 5 mg every 6 for tachyarrhythmia v. No anticoagulation given prior GI bleed 10. Hypernatremia is relatively resolved a. CKD 3B i. Creatinine variable GFR ranges between 25 and 23 ii. Continue bicarb 650 twice daily 11. Hypokalemia a. resolved b. No further current replacement 12. Iron deficiency anemia + anemia of renal disease  a. status post 1 unit PRBC this admission  i. Received Feraheme 8/6 ii. Given hemoglobin is in the 7 range-redosed Feraheme again 8/14 iii. Needs iron studies in about 2 to 3 weeks at facility 13. Acute superimposed chronic kidney disease stage IIIb a. See above discussion regarding fluid status  DVT prophylaxis: SCD Code Status: Full CODE STATUS Family Communication: d/w daughter on telephone in detail 8/16 Disposition: nearing d/c once wound care can be resolved  Status is: Inpatient  Remains inpatient appropriate because:Persistent severe electrolyte disturbances, Ongoing active pain requiring inpatient pain management, Altered mental status and IV treatments appropriate due to intensity of illness or inability to take PO   Dispo: The patient is from: Home              Anticipated d/c is to: SNF              Anticipated d/c date is: 1 day              Patient currently is not medically stable to d/c.   Consultants:   GI  General surgery  Neurology  Procedures: Multiple  Antimicrobials: Zosyn--Augmentin finishing on 8/17   Subjective:  Eating and drinking fine General surgery has seen her wound and decided to put it back She seems less swollen in her legs and her arms seem to be coming down slowly  Objective: Vitals:   07/03/20 0500 07/03/20 0511  07/03/20 1004 07/03/20 1221  BP:  (!) 104/47 (!) 114/52 (!) 133/57  Pulse:  68 69 68  Resp:  (!) 22 (!) 22 (!) 22  Temp:  98.2 F (36.8 C) 97.7 F (36.5 C) 97.8 F (36.6 C)  TempSrc:  Oral Oral Axillary  SpO2:  97% 100% 98%  Weight: 93.6 kg     Height:        Intake/Output Summary (Last 24 hours) at 07/03/2020 1543 Last data filed at 07/03/2020 0800 Gross per 24 hour  Intake 500 ml  Output --  Net 500 ml   Filed Weights   07/01/20 0331 07/02/20 0359 07/03/20 0500  Weight: 87.3 kg 87.1 kg 93.6 kg    Examination:  General exam: Hard of hearing pleasant Respiratory system: Clear no rales no rhonchi Cardiovascular system: S1-S2 no murmur paced rhythm pacemaker in place-patient also has a central line in the right neck 12 beats of V. tach today Gastrointestinal system: abd soft-wound not examined today Central nervous system: Intact no focal deficit moving 4 limbs relatively well Extremities: ROM intact no other issue Skin: Less edema is less in her lower extremities  Psychiatry:  Euthymic pleasant  Data Reviewed: I have personally reviewed following labs and imaging studies  Potassium 3.2-->3.6  bicarb 16  BUN/creatinine 22/1.9-->21/2.05 Hemoglobin 7.3-->7.6 platelet 166 WBC 6.9  Radiology Studies: CT ABDOMEN PELVIS WO CONTRAST  Result Date: 07/02/2020 CLINICAL DATA:  Abdominal pain. EXAM: CT ABDOMEN AND PELVIS WITHOUT CONTRAST TECHNIQUE: Multidetector CT imaging of the abdomen and pelvis was performed following the standard protocol without IV contrast. COMPARISON:  June 15, 2020 FINDINGS: Lower chest: Mild atelectasis and/or infiltrate is seen within the bilateral lung bases and posterior aspect of the left upper lobe. Multiple sternal wires are noted. Hepatobiliary: No focal liver abnormality is seen. Status post cholecystectomy. No biliary dilatation. Pancreas: Unremarkable. No pancreatic ductal dilatation or surrounding inflammatory changes. Spleen: A 3.6 cm diameter  cyst is seen within the posteromedial aspect of the spleen. Adrenals/Urinary Tract: Adrenal glands are unremarkable. Kidneys are normal in size. 7 mm and 4 mm round hyperdense foci are seen within the anterior aspect of the mid right kidney. A 2 mm nonobstructing renal stone is noted within the upper pole of the right kidney. A 4 mm nonobstructing renal stone is seen within the anterior aspect of the lower pole of the right kidney. A left-sided endo ureteral stent is noted. A 2 mm nonobstructing renal stone is seen within the mid left kidney. Bladder is unremarkable. Stomach/Bowel: Stomach is within normal limits. The appendix is not clearly identified. Surgical sutures are seen along the expected region of the distal ascending colon. No evidence of bowel dilatation. Noninflamed diverticula are seen within the large bowel. Vascular/Lymphatic: There is marked severity calcification of the abdominal aorta and bilateral common iliac arteries, without evidence of aneurysmal dilatation. 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 No enlarged abdominal or pelvic lymph nodes. Reproductive: Uterus and bilateral adnexa are unremarkable. Other: A surgical defect is seen along the midline of the anterior abdominal wall. No abdominopelvic ascites. Musculoskeletal: Moderate severity degenerative changes seen throughout the lumbar spine with approximately 3 mm retrolisthesis of the L4 vertebral body on L5 and 2 mm retrolisthesis of the L5 vertebral body on S1. IMPRESSION: 1. Mild atelectasis and/or infiltrate within the bilateral lung bases and posterior aspect of the left upper lobe. 2. Evidence of prior cholecystectomy. 3. Bilateral nonobstructing renal calculi. 4. Colonic diverticulosis. Aortic Atherosclerosis (ICD10-I70.0). Electronically Signed   By: Virgina Norfolk M.D.   On: 07/02/2020 15:08     Scheduled Meds: . (feeding supplement) PROSource Plus  30 mL Oral BID BM  . acetaminophen  1,000 mg Oral  Q6H  . Chlorhexidine Gluconate Cloth  6 each Topical Daily  . feeding supplement  1 Container Oral TID BM  . furosemide  20 mg Oral Daily  . metoprolol succinate  12.5 mg Oral Daily  . midodrine  5 mg Oral TID WC  . multivitamin with minerals  1 tablet Oral Daily  . pantoprazole  40 mg Oral Daily  . sodium bicarbonate  650 mg Oral BID  . sodium chloride flush  3 mL Intravenous Q12H   Continuous Infusions:    LOS: 22 days    Time spent: [place], MD [facility] To contact the attending provider between 7A-7P or the covering provider during after hours 7P-7A, please log into the web site [email or web address] and access using universal [place] password for that web site. If you do not have the password, please call the hospital operator.  07/03/2020, 3:43 PM

## 2020-07-03 NOTE — Progress Notes (Signed)
Nutrition Follow-up  RD working remotely.  DOCUMENTATION CODES:   Obesity unspecified  INTERVENTION:  - continue Boost Breeze TID and 30 ml Prosource Plus BID.   NUTRITION DIAGNOSIS:   Inadequate oral intake related to altered GI function (newly diagnosed colon adenocarcimona pending right colectomy) as evidenced by  (NPO/CL > 5 days). -diet advanced, intakes improving  GOAL:   Patient will meet greater than or equal to 90% of their needs -unmet  MONITOR:   PO intake, Supplement acceptance, Diet advancement, Weight trends, Labs, I & O's  ASSESSMENT:   86 year old female with past medical history of tachybradycardia syndrome s/p BiV pacemaker in 2007, severe MR s/p repair, chronic atrial fibrillation, history of GI bleed, abnormal esophageal motility consistent with presbyesophagus, gastritis with recent balloon dilation on July 20, HTN, and gout presented with 2 day history of urinary tract symptoms, right upper abdominal pain, and several week history of black stools admitted for sepsis secondary to UTI in the setting of obstructive uropathy and acute on chronic anemia.  Diet advanced from CLD to [place] on 8/12 at 1050 and to Soft on 8/13 at 1406. Flow sheet documentation indicates that patient has been eating 0-50% since diet advancement to Soft. She has been accepting Boost Breeze and Prosource Plus 50-75% of the time offered.   Weight has been fluctuating throughout hospitalization with an average weight of 205 lb. Weight today is 206 lb.   Per notes: - Surgery following and plan for wound vac to abdomen today - cecal adenocarcinoma s/p R colectomy on 8/6 - from home and likely d/c plan is for SNF   Labs reviewed; Cl: 118 mmol/l, creatinine: 2.05 mg/dl, Ca: 7.3 mg/dl, GFR: 21 ml/min. Medications reviewed; 20 mg oral lasix/day, 1 tablet multivitamin with minerals/day, 40 mg oral protonix/day, 650 mg sodium bicarb BID.    Diet Order:   Diet Order            DIET SOFT Room  service appropriate? Yes; Fluid consistency: Thin  Diet effective now                 EDUCATION NEEDS:   Not appropriate for education at this time  Skin:  Skin Assessment: Skin Integrity Issues: Skin Integrity Issues:: Other (Comment) Other: MASD; buttocks  Last BM:  8/16  Height:   Ht Readings from Last 1 Encounters:  06/11/20 5\' 2"  (1.575 m)    Weight:   Wt Readings from Last 1 Encounters:  07/03/20 93.6 kg     Estimated Nutritional Needs:  Kcal:  1750-1950 Protein:  98-108 Fluid:  >/= 1.7 L     Jarome Matin, MS, RD, LDN, CNSC Inpatient Clinical Dietitian RD pager # available in [facility]  After hours/weekend pager # available in [facility]

## 2020-07-03 NOTE — Progress Notes (Signed)
Patient had 12 beats runs of V-tach, Patient alert sitting up in the chair. Patient denies any chest pain or distress. Vitals 97.7, 114/52,69,22,100%-RA. Dr. Verlon Au notified, order written for metoprolol. Will continue to assess patient.

## 2020-07-03 NOTE — Progress Notes (Addendum)
11 Days Post-Op    CC: Abdominal pain  Subjective: Nursing staff says she had quite a bit of drainage yesterday, this a.m. I took the dressing down was clean and dry.  There was no fluid at the base this morning.  Patient's refusing her abdominal binder.  She says she is eating better.  Objective: Vital signs in last 24 hours: Temp:  [97.4 F (36.3 C)-98.3 F (36.8 C)] 98.2 F (36.8 C) (08/17 0511) Pulse Rate:  [68-75] 68 (08/17 0511) Resp:  [19-22] 22 (08/17 0511) BP: (95-105)/(40-56) 104/47 (08/17 0511) SpO2:  [96 %-100 %] 97 % (08/17 0511) Weight:  [93.6 kg] 93.6 kg (08/17 0500) Last BM Date: 07/02/20 680 p.o. recorded No IV fluids recorded Urine 100 recorded, voided x2 BM x5 recorded She is afebrile systolic blood pressure mid 90s - 105 range. Heart rate in the 70s respiratory rate 16-22. Sats are 96 to 99% on room air. No labs today   Intake/Output from previous day: 08/16 0701 - 08/17 0700 In: 680 [P.O.:680] Out: 100 [Urine:100] Intake/Output this shift: No intake/output data recorded.  General appearance: alert, cooperative and no distress Resp: clear to auscultation bilaterally GI: Soft, sore, the midline incision is open, it is clean and dry this AM.  She reports she is tolerating her diet well, she reports BM  Lab Results:  Recent Labs    07/01/20 0535 07/02/20 0409  WBC 6.2 6.9  HGB 7.3* 7.6*  HCT 25.7* 26.3*  PLT 166 160    BMET Recent Labs    07/01/20 0535 07/02/20 0409  NA 145 144  K 3.2* 3.6  CL 117* 118*  CO2 17* 16*  GLUCOSE 85 78  BUN 22 21  CREATININE 1.96* 2.05*  CALCIUM 7.4* 7.3*   PT/INR No results for input(s): LABPROT, INR in the last 72 hours.  Recent Labs  Lab 06/28/20 0355  AST 14*  ALT 11  ALKPHOS 102  BILITOT 0.5  PROT 4.5*  ALBUMIN 1.9*     Lipase     Component Value Date/Time   LIPASE 27 06/11/2020 0123   Anti-infectives (From admission, onward)   Start     Dose/Rate Route Frequency Ordered Stop    06/28/20 1415  amoxicillin-clavulanate (AUGMENTIN) 500-125 MG per tablet 500 mg     Discontinue     1 tablet Oral 2 times daily 06/28/20 1316 07/04/20 0959   06/24/20 2200  piperacillin-tazobactam (ZOSYN) IVPB 3.375 g  Status:  Discontinued        3.375 g 12.5 mL/hr over 240 Minutes Intravenous Every 8 hours 06/24/20 2056 06/28/20 1316   06/22/20 0911  sodium chloride 0.9 % with cefoTEtan (CEFOTAN) ADS Med       Note to Pharmacy: Georgena Spurling   : cabinet override      06/22/20 0911 06/22/20 1020   06/21/20 1115  cefoTEtan (CEFOTAN) 2 g in sodium chloride 0.9 % 100 mL IVPB        2 g 200 mL/hr over 30 Minutes Intravenous On call to O.R. 06/20/20 1346 06/22/20 0559   06/20/20 2200  amoxicillin (AMOXIL) capsule 500 mg  Status:  Discontinued        500 mg Oral Every 8 hours 06/20/20 1950 06/24/20 0725   06/14/20 1000  amoxicillin (AMOXIL) capsule 500 mg        500 mg Oral Every 12 hours 06/14/20 0741 06/19/20 0959   06/11/20 1030  cefTRIAXone (ROCEPHIN) 1 g in sodium chloride 0.9 % 100 mL IVPB  Status:  Discontinued        1 g 200 mL/hr over 30 Minutes Intravenous Every 24 hours 06/11/20 1021 06/14/20 0741   06/11/20 0300  cefTRIAXone (ROCEPHIN) 1 g in sodium chloride 0.9 % 100 mL IVPB        1 g 200 mL/hr over 30 Minutes Intravenous  Once 06/11/20 0259 06/11/20 0458     Medications: . (feeding supplement) PROSource Plus  30 mL Oral BID BM  . acetaminophen  1,000 mg Oral Q6H  . amoxicillin-clavulanate  1 tablet Oral BID  . Chlorhexidine Gluconate Cloth  6 each Topical Daily  . feeding supplement  1 Container Oral TID BM  . furosemide  20 mg Oral Daily  . midodrine  5 mg Oral TID WC  . multivitamin with minerals  1 tablet Oral Daily  . pantoprazole  40 mg Oral Daily  . sodium bicarbonate  650 mg Oral BID  . sodium chloride flush  3 mL Intravenous Q12H    Assessment/Plan SIRS secondary to UTI/GI bleed Tachy-Brady syndrome - PTVP 11/2014 Severe MR s/p repair with 28 mm annuloplasty  ring/oversew LAA, 2003 Hx NSVT/chronic atrial fibrillation-not on anticoagulation secondary to GI bleed Hx CHF Hx pulmonary hypertension Anemia   -H/H 7.6/26.3 Hx CKD with AKI - creatinine is up 1.18>>1.38>>1.64>>1.67 >> 1.74(8/11)>>2.05(8/16) Hx gastritis Hx recent balloon dilatation [name] Hx spinal stenosis Hx hypertension/hypertension-on midodrine at home Hydronephrosis with cystoscopy/left retrograde pyelogram/insertion of left double-J stent 06/11/2020;Dr. Jeffie Pollock Hx remote tobacco use Malnutrition - prealbumin 11 (8/3)  Cecal Mass -adenocarcinoma - S/p Colonoscopy 06/18/20, Dr. Michail Sermon >>path: adenocarcinoma - CT chest with possible metastatic lesion in the right lung apex  - [id number] (8/3) - S/p Lap Right Hemicolectomy 8/5 Dr Doloris Hall # 12 - Path: Invasive moderately differentiated mucinous adenocarcinoma. - Med Onc has seen here in the hospital and reports they have arranged outpt follow up -Postop ileus resolved; tolerating regular diet - Wound open 8/16>> CT scan shows no signs of dehiscence. - Mobilize, Pulm toilet  - PT/OT, recommending SNF  FEN: soft diet and tolerating it well ID: No further abx indicated from a general surgery standpoint.  Zosyn 8/8-8/12 for PNA, TRH Augmentin 8/12 >> for UTI DVT: SCD's, ok for chemical DVT prophylaxis from surgical standpoint   Plan: I will talk to Dr. Johney Maine about a wound VAC.  I was considering white foam at the base just to be safe.  If she does well with this and continues to eat and have bowel movements she could probably go home later this week.  Plan wound vac today. I have also discussed this with her daughter Mont Dutton, [phone]  LOS: 22 days .   Khayri Kargbo 07/03/2020 Please see Amion

## 2020-07-03 NOTE — Consult Note (Addendum)
[facility] Nurse Consult Note: Reason for Consult:Midline incision with application NPWT with white and black foam.  Wound type:surgical Pressure Injury POA: NA Measurement: 10 cm x 6 cm x 4 cm  Wound MHW:[id number] in wound bed visible.  Drainage (amount, consistency, odor) minimal serosanguinous  No odor.  Periwound:intact Dressing procedure/placement/frequency:1 piece white foam topped with 1 piece   black foam.  Change Tues/Thurs/Saturday Will follow.  Domenic Moras MSN, RN, FNP-BC [facility] Wound, Ostomy, Continence Nurse Pager [phone]

## 2020-07-03 NOTE — Plan of Care (Addendum)
  Problem: Education: Goal: Knowledge of General Education information will improve Description: Including pain rating scale, medication(s)/side effects and non-pharmacologic comfort measures Outcome: Progressing   Problem: Health Behavior/Discharge Planning: Goal: Ability to manage health-related needs will improve Outcome: Progressing   Problem: Clinical Measurements: Goal: Ability to maintain clinical measurements within normal limits will improve Outcome: Progressing Goal: Will remain free from infection Outcome: Progressing Goal: Diagnostic test results will improve Outcome: Progressing Goal: Respiratory complications will improve Outcome: Progressing Goal: Cardiovascular complication will be avoided Outcome: Progressing   Problem: Activity: Goal: Risk for activity intolerance will decrease Outcome: Progressing   Problem: Nutrition: Goal: Adequate nutrition will be maintained Outcome: Progressing   Problem: Coping: Goal: Level of anxiety will decrease Outcome: Progressing   Problem: Elimination: Goal: Will not experience complications related to bowel motility Outcome: Progressing Goal: Will not experience complications related to urinary retention Outcome: Progressing   Problem: Safety: Goal: Ability to remain free from injury will improve Outcome: Progressing   Problem: Skin Integrity: Goal: Risk for impaired skin integrity will decrease Outcome: Progressing   Problem: Clinical Measurements: Goal: Ability to maintain clinical measurements within normal limits will improve Outcome: Progressing Goal: Postoperative complications will be avoided or minimized Outcome: Progressing   Problem: Skin Integrity: Goal: Demonstration of wound healing without infection will improve Outcome: Progressing  0156- noted 14 beats of nonsustained vtach by the central monitor, strip reviewed. VS taken, pt denied any dizziness, palpitations nor chest pain, to inform  hospitalist on call. 0211- Messaged and paged on call hospitalist, pt awake not in distress. 0230- Paged not acknowledged by hospitalist, resent messaged, pt not in distress, assisted to clean up after a BM. 0245- Reviewed previous tele strips, noted several intermittent runs of NS vtach. Latest K 3.6, Mg 1.9

## 2020-07-04 DIAGNOSIS — I5033 Acute on chronic diastolic (congestive) heart failure: Secondary | ICD-10-CM

## 2020-07-04 DIAGNOSIS — K567 Ileus, unspecified: Secondary | ICD-10-CM

## 2020-07-04 DIAGNOSIS — I472 Ventricular tachycardia: Secondary | ICD-10-CM

## 2020-07-04 LAB — CBC WITH DIFFERENTIAL/PLATELET
Abs Immature Granulocytes: 0.06 10*3/uL (ref 0.00–0.07)
Basophils Absolute: 0 10*3/uL (ref 0.0–0.1)
Basophils Relative: 0 %
Eosinophils Absolute: 0.3 10*3/uL (ref 0.0–0.5)
Eosinophils Relative: 4 %
HCT: 26.2 % — ABNORMAL LOW (ref 36.0–46.0)
Hemoglobin: 7.4 g/dL — ABNORMAL LOW (ref 12.0–15.0)
Immature Granulocytes: 1 %
Lymphocytes Relative: 25 %
Lymphs Abs: 1.7 10*3/uL (ref 0.7–4.0)
MCH: 26.1 pg (ref 26.0–34.0)
MCHC: 28.2 g/dL — ABNORMAL LOW (ref 30.0–36.0)
MCV: 92.6 fL (ref 80.0–100.0)
Monocytes Absolute: 0.7 10*3/uL (ref 0.1–1.0)
Monocytes Relative: 10 %
Neutro Abs: 4.1 10*3/uL (ref 1.7–7.7)
Neutrophils Relative %: 60 %
Platelets: 154 10*3/uL (ref 150–400)
RBC: 2.83 MIL/uL — ABNORMAL LOW (ref 3.87–5.11)
RDW: 21.9 % — ABNORMAL HIGH (ref 11.5–15.5)
WBC: 6.8 10*3/uL (ref 4.0–10.5)
nRBC: 0.3 % — ABNORMAL HIGH (ref 0.0–0.2)

## 2020-07-04 LAB — COMPREHENSIVE METABOLIC PANEL
ALT: 9 U/L (ref 0–44)
AST: 14 U/L — ABNORMAL LOW (ref 15–41)
Albumin: 1.9 g/dL — ABNORMAL LOW (ref 3.5–5.0)
Alkaline Phosphatase: 91 U/L (ref 38–126)
Anion gap: 7 (ref 5–15)
BUN: 24 mg/dL — ABNORMAL HIGH (ref 8–23)
CO2: 16 mmol/L — ABNORMAL LOW (ref 22–32)
Calcium: 7 mg/dL — ABNORMAL LOW (ref 8.9–10.3)
Chloride: 118 mmol/L — ABNORMAL HIGH (ref 98–111)
Creatinine, Ser: 1.83 mg/dL — ABNORMAL HIGH (ref 0.44–1.00)
GFR calc Af Amer: 27 mL/min — ABNORMAL LOW (ref >60)
GFR calc non Af Amer: 24 mL/min — ABNORMAL LOW (ref >60)
Glucose, Bld: 99 mg/dL (ref 70–99)
Potassium: 3.1 mmol/L — ABNORMAL LOW (ref 3.5–5.1)
Sodium: 141 mmol/L (ref 135–145)
Total Bilirubin: 0.5 mg/dL (ref 0.3–1.2)
Total Protein: 4.5 g/dL — ABNORMAL LOW (ref 6.5–8.1)

## 2020-07-04 LAB — VITAMIN D 25 HYDROXY (VIT D DEFICIENCY, FRACTURES): Vit D, 25-Hydroxy: 19.83 ng/mL — ABNORMAL LOW (ref 30–100)

## 2020-07-04 LAB — PHOSPHORUS: Phosphorus: 3.2 mg/dL (ref 2.5–4.6)

## 2020-07-04 LAB — MAGNESIUM: Magnesium: 2 mg/dL (ref 1.7–2.4)

## 2020-07-04 MED ORDER — POTASSIUM CHLORIDE 20 MEQ PO PACK
40.0000 meq | PACK | ORAL | Status: AC
  Administered 2020-07-04 (×2): 40 meq via ORAL
  Filled 2020-07-04 (×2): qty 2

## 2020-07-04 MED ORDER — FUROSEMIDE 10 MG/ML IJ SOLN
20.0000 mg | Freq: Once | INTRAMUSCULAR | Status: AC
  Administered 2020-07-04: 20 mg via INTRAVENOUS
  Filled 2020-07-04: qty 2

## 2020-07-04 MED ORDER — CALCIUM GLUCONATE-NACL 1-0.675 GM/50ML-% IV SOLN
1.0000 g | Freq: Once | INTRAVENOUS | Status: AC
  Administered 2020-07-04: 1000 mg via INTRAVENOUS
  Filled 2020-07-04: qty 50

## 2020-07-04 MED ORDER — TORSEMIDE 20 MG PO TABS
20.0000 mg | ORAL_TABLET | Freq: Every day | ORAL | Status: DC
  Administered 2020-07-06 – 2020-07-11 (×6): 20 mg via ORAL
  Filled 2020-07-04 (×8): qty 1

## 2020-07-04 NOTE — Progress Notes (Addendum)
PROGRESS NOTE    [name]  MKJ:[id number] DOB: 1926-05-25 DOA: 06/11/2020 PCP: Sherry Carol, MD   No chief complaint on file.   Brief Narrative:  86 year old lady with prior history of tachybradycardia syndrome s/p biventricular pacemaker placement in 2007, severe MR s/p repair, chronic atrial fibrillation not on anticoagulation secondary to GI bleed, abnormal esophageal motility consistent with presbyesophagus, gastritis on last endoscopy with balloon dilatation by Dr. Alessandra [name], pulmonary hypertension presents with 2 days of dysuria, and abdominal pain, black stools for several weeks, and increased confusion admitted for sepsis secondary to urinary tract infection in the setting of obstructive uropathy and acute on chronic anemia.  CT of the abdomen and pelvis shows left-sided moderate hydronephrosis with intrarenal calculi bilaterally, left ureteral calculi and soft tissue fullness in the cecal area.  Patient underwent ureteral stent placement by urology on 06/11/2020.  And was started on IV Rocephin and admitted to [facility] service for further evaluation and management. Urine culture showed lactobacillus transition IV Rocephin to oral amoxicillin for another 2 weeks to complete the course. Dr [name] with Urology suggested he will post pone the stent removal to a later date. She underwent CT abd showing cecal mass concerning for malignancy. She underwent colonoscopy on 06/18/20 by Dr [name] showing large cecal mass concerning for malignancy. Gen surgery consulted for further management. Cecal mass pathology shows adenocarcinoma. Cardiology consulted for pre op clearance as she might need surgical intervention.  Oncology consulted who recommended CT chest for further staging.  Patient underwent surgery 06/21/2020 with pathology consistent with adenocarcinoma involving the cecum invading into subserosal area without lymphovascular or perineural invasion, 20 lymph nodes were negative for CA-CEA was 575  on 06/19/2020.  Hospital course complicated by probable ileus as well as multiple dark watery stools, patient reassessed by surgery and Reglan added.  Patient diet advanced to a solid diet since 06/30/2020 which has been tolerating.  Patient noted to have some oozing had a subcutaneous hematoma removed 07/02/2020 and wound VAC placed over base of incision. Patient also noted to have some runs of nonsustained V. tach..   Assessment & Plan:   Principal Problem:   Acute lower UTI Active Problems:   Liver cirrhosis (HCC)   CKD (chronic kidney disease) stage 3, GFR 30-59 ml/min   Anemia   Biventricular cardiac pacemaker in situ   Permanent atrial fibrillation (HCC)   Chronic diastolic CHF (congestive heart failure) (HCC)   Gout   S/P MVR (mitral valve repair)   AKI (acute kidney injury) ([place])   Orthostatic hypotension dysautonomic syndrome (HCC)   GI bleeding   Obstructive uropathy   Sepsis secondary to UTI ([place])   Hydronephrosis of left kidney   Left ureteral stone   Advanced care planning/counseling discussion   Goals of care, counseling/discussion   Palliative care by specialist   DNR (do not resuscitate)   Acute on chronic renal insufficiency   Iron deficiency anemia due to chronic blood loss   Cancer of cecum (HCC)   Emesis   Gastritis with hemorrhage   Acute on chronic diastolic CHF (congestive heart failure) ([place])   Ileus (HCC)  1 SIRS/Sepsis secondary to UTI Patient on admission met criteria for systemic inflammatory response with, MAP < 65, AKI, urinalysis worrisome for UTI.  Urine cultures positive for lactobacillus.  Patient noted to have left-sided moderate hydronephrosis secondary to left ureteral stone and underwent left ureteral stent placement 06/11/2020.  Patient was on IV Rocephin which has subsequently been discontinued.  Amoxicillin started 06/20/2020  to complete a 14-day course of antibiotic treatment as patient with stent placement and left urethral stone.  Patient  due to emesis and concern for possible aspiration pneumonia amoxicillin discontinued and patient placed on IV Zosyn.  Patient improved clinically was transition to Augmentin and complete a full course of antibiotic treatment.  Afebrile.  Follow.   2.  Obstructive uropathy with left-sided moderate hydronephrosis secondary to left ureteral stone Noted on CT abdomen and pelvis.  Patient seen in consultation by urology and underwent left ureteral stent placement 06/11/2020.  Foley catheter initially discontinued and placed back in perioperatively.  Foley catheter has been discontinued. Urine cultures positive for lactobacillus.  Patient on IV Rocephin which was subsequently discontinued.  Amoxicillin started 06/20/2020 to complete a 10 - 14-day course of antibiotic treatment.  Patient with good urine output.  Clinical improvement.  Per urology reschedule uteroscopy for about 2 to 3 weeks out to give patient opportunity to recover from pending colon resection.  Patient with bout of nausea and emesis 06/24/2020, concern for aspiration pneumonia and as such antibiotic were changed to IV Zosyn.  IV Zosyn was subsequently transitioned to Augmentin and patient completed full course of antibiotic treatment.  No further antibiotics needed.  Outpatient follow-up with urology.   3.  Cecal adenocarcinoma Soft tissue fullness noted in cecal area on CT abdomen and pelvis.  Repeat CT abdomen and pelvis showing a lobulated mass in the cecum concerning for malignancy.  GI consulted and patient underwent colonoscopy with biopsy on 06/18/2020 per [name] with pathology positive for adenocarcinoma.  Patient seen in consultation by general surgery to see whether she is a candidate for diverting colostomy.  Patient seen in consultation by cardiology for preop clearance.  Patient currently not obstructed at this time.  CT chest with interval development of 10 mm subpleural rounded density seen posteriorly in the right lung apex.   Follow-up unenhanced chest CT in 3 months recommended to ensure stability or resolution.  Patient s/p laparoscopic right colectomy 06/22/2020.  Oncology consulted and are following and will evaluate postop to determine if further adjuvant treatment is feasible or warranted.  Palliative care also following.  Patient was tolerating a clear liquid diet and diet advanced to a soft diet.  Patient noted to have nausea and emesis the afternoon of 06/24/2020 and placed on a clear liquid diet.  Abdominal films obtained concerning for ileus versus SBO.  Ileus resolved.  Currently tolerating a soft diet.  Will likely follow-up with oncology in the outpatient setting.  Appreciate general surgery, oncology, palliative care input and recommendations.  4.  Fascial deficit status post hematoma removal from base of wound 07/02/2020 Patient being followed by wound care nurse as well as general surgery.  Wound VAC placed.  Per general surgery.  5.  Nausea and emesis/?  Ileus versus SBO Patient noted to have nausea and emesis the afternoon of 06/24/2020 after being started on a soft diet.  Abdominal films obtained concerning for ileus versus SBO bowel gas pattern.  Patient was started on Reglan.  Patient having bowel movements.  Ileus seems to be have resolved.  Per general surgery.  Supportive care.   6.??  Aspiration pneumonia Patient with bout of nausea and emesis on 06/24/2020.  Acute abdominal series done with left pleural effusion with left lower lobe atelectasis or infiltrate, cardiomegaly, increasing right basilar atelectasis or infiltrate.  Amoxicillin was discontinued patient was placed on IV Zosyn.  Patient subsequently transitioned from IV Zosyn to Augmentin and has   completed a full course of antibiotic treatment.  No further antibiotics needed.   7.  Probable acute on chronic  combined systolic diastolic heart failure Patient with some expiratory wheezing, 1-2+ bilateral lower extremity edema.  Patient is +10 L during  this hospitalization however unsure of accuracy of I's and O's.  Patient noted to have a weight of f 93.6 kg (07/03/2020) from 87.1 kg (07/02/2020).  2D echo from May 2021 with a EF of 40%.  Patient denies any significant shortness of breath however noted to have expiratory wheezing..  Patient received a dose of IV Lasix on 06/21/2020.  Patient on Lasix 20 mg daily however was on Demadex prior to admission.  We will give Lasix 20 mg IV x1.  Strict I's and O's.  Daily weights.  Follow.  May need to likely resume home regimen Demadex instead of oral Lasix.   8.  Chronic hypotension Continue midodrine.   9.  Permanent atrial fibrillation Currently rate controlled on beta-blocker..  Patient not a anticoagulation candidate secondary to history of GI bleed.  Cardiology was following however have signed off.   10.  Tachybradycardia syndrome with BiV PPM/nonsustained V. tach Stable.  Last interrogation April 2021 patient noted to be V pacing.  Patient noted to have a bout of nonsustained V. tach this morning per RN.  Potassium at 3.1.  Keep potassium > 4.  Keep magnesium >2.  we will give a dose of oral potassium.  Corrected calcium noted at 8.68.  IV calcium gluconate x1.  Follow.   11.  Acute on chronic kidney disease stage IIIb Likely post renal azotemia in the setting of obstructive uropathy in the setting of sepsis and relative hypotension on admission.  Creatinine noted to have peaked at 3 and trending down.  Creatinine currently at 1.83.  Urine output 650 cc over the past 24 hours in addition to some unmeasured urine output..  Patient received a dose of IV Lasix on 06/21/2020.  Demadex was resumed however held due to nausea and vomiting.  Creatinine with a slight bump however close to baseline.  Patient on oral Lasix 20 mg daily, however patient with some expiratory wheezing and some lower extremity edema due to concerns for volume overload.  Patient supposedly is +10.148 L during this hospitalization however  unsure about accuracy of I's and O's.  Current weight of 93.6 kg (07/03/2020) from 87.1 kg (07/02/2020).  We will give a dose of Lasix 20 mg IV x1.  Follow.   12.  Hypernatremia Likely secondary to free water deficit secondary to poor oral intake. Improved.  Diuretics initially held and patient on low-dose Lasix 20 mg daily.  Follow.    13.  Iron deficiency anemia secondary to colonic cancer bleed/anemia of chronic disease Status post recent EGD showing gastritis and presbyesophagus.  Baseline hemoglobin approximately 9.  On admission hemoglobin noted to be 7.9.  Status post 1 unit transfusion packed red blood cells hemoglobin currently at 7.4 and stable.  Monitor H&H postop.  Status post IV Feraheme 510 mg x 1( 06/24/2020) and 06/30/2020.  Transfusion threshold hemoglobin < 7.     DVT prophylaxis: SCDs.  History of GI bleed. Code Status: full Family Communication: Updated son at bedside. Disposition:   Status is: Inpatient    Dispo: The patient is from: Assisted living facility              Anticipated d/c is to: SNF                Anticipated d/c date is: To be determined.              Patient currently being worked up for cecal cancer and s/p laparoscopic right colectomy 06/22/2020.  Patient ileus, now with some expiratory wheezing concern for volume overload, tolerating current diet.  General surgery following.  Oncology following.  Currently not stable for discharge today.        Consultants:   General surgery: SherryToth III 06/19/2020  Cardiology: Dr. Turner 06/19/2020  Gastroenterology: Dr. Magod 06/11/2020  Urology: Dr. Wrenn 06/11/2020  Oncology Dr. Dorsey 06/20/2020  Palliative care: Dr. Anwar 06/20/2020  Procedures:  CT chest 06/28/2020  CT abdomen and pelvis 06/15/2020, 06/11/2020, 07/02/2020  Chest x-ray 06/11/2020  Colonoscopy 06/18/2020--per [name] gastroenterology  Cystoscopy with left retrograde pyelogram and interpretation/cystoscopy with insertion of left double-J  stent per SherryWrenn 06/11/2020  Transfusion 1 unit packed red blood cells 06/11/2020  Laparoscopic right colectomy by Dr. Toth III 06/22/2020  Evacuation of subcutaneous hematoma per general surgery 07/02/2020  Antimicrobials:  IV Rocephin 06/11/2020>>>> 06/14/2020  Amoxicillin 06/20/2020>>>>> 06/24/2020  IV Zosyn 06/24/2020>>>>> 06/28/2020  Augmentin 06/28/2020>>>> 07/03/2020   Subjective: Patient laying in bed, son at bedside.  Denies any chest pain no significant shortness of breath.  Denies any significant abdominal pain.  Having bowel movement.  Tolerating soft diet.  Eating her lunch.     Objective: Vitals:   07/04/20 0624 07/04/20 0850 07/04/20 1213 07/04/20 1443  BP: (!) 99/45 (!) 110/43 (!) 96/43 (!) 106/45  Pulse: 71 71 69 71  Resp: 20   16  Temp: 97.6 F (36.4 C)   97.8 F (36.6 C)  TempSrc: Oral   Oral  SpO2: 99%   100%  Weight:      Height:        Intake/Output Summary (Last 24 hours) at 07/04/2020 1912 Last data filed at 07/04/2020 1856 Gross per 24 hour  Intake 720 ml  Output 650 ml  Net 70 ml   Filed Weights   07/01/20 0331 07/02/20 0359 07/03/20 0500  Weight: 87.3 kg 87.1 kg 93.6 kg    Examination:  General exam: NAD. Respiratory system: Some expiratory wheezing.  Upper airway noise.  No crackles.  No rhonchi.  No use of accessory muscles of respiration. Cardiovascular system: RRR no murmurs rubs or gallops.  No JVD.  1-2+ bilateral lower extremity edema.  Gastrointestinal system: Abdomen is soft, obese, nontender to palpation.  Wound VAC noted over mid abdominal region.  Positive bowel sounds.  No rebound.  No guarding. Central nervous system: Alert and oriented.  Moving extremities spontaneously.  Extremities: Symmetric 5 x 5 power. Skin: No rashes, lesions or ulcers Psychiatry: Judgement and insight appear fair. Mood & affect appropriate.     Data Reviewed: I have personally reviewed following labs and imaging studies  CBC: Recent Labs  Lab  06/28/20 0355 06/29/20 0335 07/01/20 0535 07/02/20 0409 07/04/20 0310  WBC 7.2 6.2 6.2 6.9 6.8  NEUTROABS 5.5 4.3 4.1 4.3 4.1  HGB 7.3* 7.5* 7.3* 7.6* 7.4*  HCT 25.2* 26.0* 25.7* 26.3* 26.2*  MCV 88.7 89.0 91.5 91.3 92.6  PLT 157 169 166 160 154    Basic Metabolic Panel: Recent Labs  Lab 06/28/20 0355 06/29/20 0335 07/01/20 0535 07/02/20 0409 07/04/20 0310 07/04/20 1109  NA 140 143 145 144 141  --   K 3.8 3.6 3.2* 3.6 3.1*  --   CL 112* 114* 117* 118* 118*  --   CO2 19* 19* 17* 16* 16*  --     GLUCOSE 88 83 85 78 99  --   BUN 19 23 22 21 24*  --   CREATININE 1.78* 1.90* 1.96* 2.05* 1.83*  --   CALCIUM 7.4* 7.5* 7.4* 7.3* 7.0*  --   MG  --   --  2.0 1.9 2.0  --   PHOS  --   --   --   --   --  3.2    GFR: Estimated Creatinine Clearance: 21.3 mL/min (A) (by C-G formula based on SCr of 1.83 mg/dL (H)).  Liver Function Tests: Recent Labs  Lab 06/28/20 0355 07/04/20 0310  AST 14* 14*  ALT 11 9  ALKPHOS 102 91  BILITOT 0.5 0.5  PROT 4.5* 4.5*  ALBUMIN 1.9* 1.9*    CBG: No results for input(s): GLUCAP in the last 168 hours.   Recent Results (from the past 240 hour(s))  Urine Culture     Status: None   Collection Time: 07/02/20 10:57 AM   Specimen: Urine, Catheterized  Result Value Ref Range Status   Specimen Description   Final    URINE, CATHETERIZED Performed at [facility], [address] [place], [place] [address]    Special Requests   Final    NONE Performed at [facility], [address] [place], [place] [address]    Culture   Final    NO GROWTH Performed at [facility], [address] ,  [address]    Report Status 07/03/2020 FINAL  Final         Radiology Studies: No results found.      Scheduled Meds: . (feeding supplement) PROSource Plus  30 mL Oral BID BM  . acetaminophen  1,000 mg Oral Q6H  . Chlorhexidine Gluconate Cloth  6 each Topical Daily  . feeding supplement   1 Container Oral TID BM  . furosemide  20 mg Oral Daily  . metoprolol succinate  12.5 mg Oral Daily  . midodrine  5 mg Oral TID WC  . multivitamin with minerals  1 tablet Oral Daily  . pantoprazole  40 mg Oral Daily  . sodium bicarbonate  650 mg Oral BID  . sodium chloride flush  3 mL Intravenous Q12H   Continuous Infusions:    LOS: 23 days    Time spent: 40 minutes     , MD [facility]   To contact the attending provider between 7A-7P or the covering provider during after hours 7P-7A, please log into the web site [email or web address] and access using universal [place] password for that web site. If you do not have the password, please call the hospital operator.  07/04/2020, 7:12 PM   

## 2020-07-04 NOTE — Progress Notes (Signed)
Physical Therapy Treatment Patient Details Name: [name] MRN: [id number] DOB: 1925-11-30 Today's Date: 07/04/2020    History of Present Illness 86 yo female admitted with UTI SIRS. S/P cystoscopy, L double J stent placement 06/11/20. S/P R colectomy 06/22/20. Hx of Afib, CKD, anemia, CHF, NSVT, pacemaker, chronic back pain, spinal stenosis, orthostatic hypotension. Wound vac placed 8/17    PT Comments    Pt reports feeling better on today. She was able to perform seated exercises, pre-gait/standing marching, and walk a few steps forwards then backwards with a RW. Dyspnea 2-3/4 + wheezing with all activity. Assisted pt back into recliner for comfort. Continue to recommend SNF rehab.    Follow Up Recommendations  SNF     Equipment Recommendations  None recommended by PT    Recommendations for Other Services       Precautions / Restrictions Precautions Precautions: Fall Precaution Comments: loose stools, wound vac Restrictions Weight Bearing Restrictions: No    Mobility  Bed Mobility Overal bed mobility: Needs Assistance          General bed mobility comments: oob in recliner  Transfers Overall transfer level: Needs assistance Equipment used: Rolling walker (2 wheeled) Transfers: Sit to/from Stand Sit to Stand: Min guard        General transfer comment: VCs safety, technique, hand placement.  Ambulation/Gait Ambulation/Gait assistance: Min assist Gait Distance (Feet): 5 Feet Assistive device: Rolling walker (2 wheeled) Gait Pattern/deviations: Step-through pattern;Decreased stride length     General Gait Details: Pt performed some pre-gait marching in front of chair before walking a few feet forward then a few feet backwards. Dyspnea 2/4 with wheezing. Assist to steady and manage RW. Fall risk.    Stairs             Wheelchair Mobility    Modified [place] (Stroke Patients Only)       Balance Overall balance assessment: Needs  assistance Sitting-balance support: No upper extremity supported;Single extremity supported Sitting balance-Leahy Scale: Poor     Standing balance support: Bilateral upper extremity supported Standing balance-Leahy Scale: Poor                              Cognition Arousal/Alertness: Awake/alert Behavior During Therapy: WFL for tasks assessed/performed Overall Cognitive Status: History of cognitive impairments - at baseline                                 General Comments: patient unable to remember when wound vac was placed       Exercises Total Joint Exercises Ankle Circles/Pumps: AROM;Both;10 reps Quad Sets: AROM;Both;10 reps Hip ABduction/ADduction: AROM;Both;10 reps Long Arc Quad: AROM;Both;10 reps    General Comments        Pertinent Vitals/Pain Pain Assessment: Faces Faces Pain Scale: Hurts little more Pain Location: abdomen, back Pain Descriptors / Indicators: Discomfort;Sore Pain Intervention(s): Monitored during session    Home Living                      Prior Function            PT Goals (current goals can now be found in the care plan section) Acute Rehab PT Goals Patient Stated Goal: agreeable to chair Progress towards PT goals: Progressing toward goals    Frequency    Min 2X/week      PT Plan Current plan remains  appropriate    Co-evaluation              AM-PAC PT "6 Clicks" Mobility   Outcome Measure  Help needed turning from your back to your side while in a flat bed without using bedrails?: A Lot Help needed moving from lying on your back to sitting on the side of a flat bed without using bedrails?: A Lot Help needed moving to and from a bed to a chair (including a wheelchair)?: A Little Help needed standing up from a chair using your arms (e.g., wheelchair or bedside chair)?: A Little Help needed to walk in hospital room?: A Little Help needed climbing 3-5 steps with a railing? : Total 6  Click Score: 14    End of Session   Activity Tolerance: Patient limited by fatigue Patient left: in chair;with call bell/phone within reach;with chair alarm set   PT Visit Diagnosis: Difficulty in walking, not elsewhere classified (R26.2);Muscle weakness (generalized) (M62.81)     Time: 1275-1700 PT Time Calculation (min) (ACUTE ONLY): 13 min  Charges:  $Gait Training: 8-22 mins                         Doreatha Massed, PT Acute Rehabilitation  Office: [phone] Pager: [phone]

## 2020-07-04 NOTE — Progress Notes (Signed)
Occupational Therapy Progress Note  Patient mod A for log rolling technique, reports tenderness in abdomen with bed mobility. Wound vac placed, patient unable to recall when chart states on 8/17. Patient min G to BSC, total A for peri care after bowel movement and min G to recliner. Set up for g/h in chair. Poor carry over of safety with body mechanics to reach back for chair when sitting into recliner. Will continue to follow.    07/04/20 1500  OT Visit Information  Last OT Received On 07/04/20  Assistance Needed +1  History of Present Illness 86 yo female admitted with UTI SIRS. S/P cystoscopy, L double J stent placement 06/11/20. S/P R colectomy 06/22/20. Hx of Afib, CKD, anemia, CHF, NSVT, pacemaker, chronic back pain, spinal stenosis, orthostatic hypotension. Wound vac placed 8/17  Precautions  Precautions Fall  Precaution Comments loose stools, wound vac  Pain Assessment  Pain Assessment Faces  Faces Pain Scale 4  Pain Location abdomen with bed mobility  Pain Descriptors / Indicators Tender  Pain Intervention(s) Monitored during session  Cognition  Arousal/Alertness Awake/alert  Behavior During Therapy WFL for tasks assessed/performed  Overall Cognitive Status History of cognitive impairments - at baseline  General Comments patient unable to remember when wound vac was placed   ADL  Overall ADL's  Needs assistance/impaired  Grooming Wash/dry face;Wash/dry hands;Set up;Sitting  Toilet Transfer Min guard;BSC;Stand-pivot;RW  Toilet Transfer Details (indicate cue type and reason) cues for safe use of walker  Toileting- Clothing Manipulation and Hygiene Total assistance;Sit to/from stand  Toileting - Clothing Manipulation Details (indicate cue type and reason) total A for peri care after bowel movement, patient has difficulty reaching to perform thoroughly   Functional mobility during ADLs Min guard;Rolling walker;Cueing for safety  Bed Mobility  Overal bed mobility Needs Assistance   Bed Mobility Rolling;Sidelying to Sit  Rolling Mod assist  Sidelying to sit Mod assist;HOB elevated  General bed mobility comments max cues to sequence, mod A to roll onto side and to raise trunk from side lying  Balance  Overall balance assessment Needs assistance  Sitting-balance support No upper extremity supported;Single extremity supported  Sitting balance-Leahy Scale Poor  Standing balance support Bilateral upper extremity supported  Standing balance-Leahy Scale Poor  Restrictions  Weight Bearing Restrictions No  Transfers  Overall transfer level Needs assistance  Equipment used Rolling walker (2 wheeled)  Transfers Sit to/from Stand;Stand Pivot Transfers  Sit to Stand Min guard  Stand pivot transfers Min guard  General transfer comment min guard for safety as patient is impulsive trying to get to [facility] cues for safe use of walker. poor carry over of hand placement when sitting back onto recliner with poor eccentric control  OT - End of Session  Equipment Utilized During Treatment Rolling walker  Activity Tolerance Patient tolerated treatment well  Patient left in chair;with call bell/phone within reach;with chair alarm set  Nurse Communication Mobility status;Other (comment) (bowel movement)  OT Assessment/Plan  OT Plan Discharge plan remains appropriate  OT Visit Diagnosis Other abnormalities of gait and mobility (R26.89);Other symptoms and signs involving cognitive function  OT Frequency (ACUTE ONLY) Min 2X/week  Follow Up Recommendations SNF  OT Equipment None recommended by OT  AM-PAC OT "6 Clicks" Daily Activity Outcome Measure (Version 2)  Help from another person eating meals? 4  Help from another person taking care of personal grooming? 3  Help from another person toileting, which includes using toliet, bedpan, or urinal? 2  Help from another person bathing (including washing,  rinsing, drying)? 2  Help from another person to put on and taking off regular upper body  clothing? 3  Help from another person to put on and taking off regular lower body clothing? 2  6 Click Score 16  OT Goal Progression  Progress towards OT goals Progressing toward goals  Acute Rehab OT Goals  Patient Stated Goal agreeable to chair  OT Goal Formulation With patient  Time For Goal Achievement 07/10/20  Potential to Achieve Goals Good  ADL Goals  Pt Will Perform Grooming standing;with supervision  Pt Will Perform Upper Body Dressing sitting;with supervision  Pt Will Perform Lower Body Dressing sit to/from stand;sitting/lateral leans;with supervision  Pt Will Transfer to Toilet ambulating;with supervision  Additional ADL Goal #1 patient will demonstrate safe body mechanics during functional transfers in 2/3 trials with no verbal cues to minimize risk of falls,  OT Time Calculation  OT Start Time (ACUTE ONLY) 1319  OT Stop Time (ACUTE ONLY) 1349  OT Time Calculation (min) 30 min  OT General Charges  $OT Visit 1 Visit  OT Treatments  $Self Care/Home Management  23-37 mins   Delbert Phenix OT OT pager: [phone]

## 2020-07-04 NOTE — TOC Progression Note (Signed)
Transition of Care [facility]) - Progression Note    Patient Details  Name: MAIRLYN TEGTMEYER MRN: [id number] Date of Birth: 1926/04/15  [facility]) CM/SW Contact  Allyna Pittsley, Juliann Pulse, RN Phone Number: 07/04/2020, 3:39 PM  Clinical Narrative:  Patient has wound vac from [facility] until we confirm if [facility] will use.     Expected Discharge Plan: [place] Barriers to Discharge: Continued Medical Work up  Expected Discharge Plan and Services Expected Discharge Plan: [name]   Discharge Planning Services: CM Consult Post Acute Care Choice: [place] Living arrangements for the past 2 months: [place]                                       Social Determinants of Health (SDOH) Interventions    Readmission Risk Interventions No flowsheet data found.

## 2020-07-04 NOTE — TOC Progression Note (Signed)
Transition of Care [facility]) - Progression Note    Patient Details  Name: [name] MRN: [id number] Date of Birth: October 12, 1926  [facility]) CM/SW Contact  Tiffiny Worthy, Juliann Pulse, RN Phone Number: 07/04/2020, 1:42 PM  Clinical Narrative: Noted has wound vac.Informed [facility]/has auth 8/17-8/19 [id number].      Expected Discharge Plan: [place] Barriers to Discharge: Continued Medical Work up  Expected Discharge Plan and Services Expected Discharge Plan: [place]   Discharge Planning Services: CM Consult Post Acute Care Choice: [place] Living arrangements for the past 2 months: [place]                                       Social Determinants of Health (SDOH) Interventions    Readmission Risk Interventions No flowsheet data found.

## 2020-07-04 NOTE — Progress Notes (Signed)
Received a call from [place] - patient had 9 beats of VTach.  Patient on bed pain having a bowel movement.  Patient denies pain.  BP 110/43. Dr. Grandville Silos on unit and notified - stated to give morning dose of Metoprolol.

## 2020-07-04 NOTE — Progress Notes (Signed)
12 Days Post-Op    CC: Abdominal pain  Subjective: Patient sitting up in bed and doing well.  She has cleaned her plate for breakfast.  When asked about the wound VAC, she said she did not know it was there.  Objective: Vital signs in last 24 hours: Temp:  [97.6 F (36.4 C)-98 F (36.7 C)] 97.6 F (36.4 C) (08/18 0624) Pulse Rate:  [68-71] 71 (08/18 0850) Resp:  [20-22] 20 (08/18 0624) BP: (99-133)/(43-57) 110/43 (08/18 0850) SpO2:  [98 %-100 %] 99 % (08/18 0624) Last BM Date: 07/04/20 780 p.o. Urine x3 Stool x4 Afebrile blood pressure ranged from 133/57-99/45 Respiratory rate 16-30 K+ 3.1 Chloride 118 Creatinine 1.83 Calcium 7, albumin 1.9, total protein 4.5. WBC 6.8 H/H 7.4/26.2 Platelets 154,000 Intake/Output from previous day: 08/17 0701 - 08/18 0700 In: 780 [P.O.:780] Out: -  Intake/Output this shift: No intake/output data recorded.  General appearance: alert, cooperative and no distress Resp: clear to auscultation bilaterally GI: Wound VAC is in place without any discomfort.  She is tolerating soft diet without any issues.  Lab Results:  Recent Labs    07/02/20 0409 07/04/20 0310  WBC 6.9 6.8  HGB 7.6* 7.4*  HCT 26.3* 26.2*  PLT 160 154    BMET Recent Labs    07/02/20 0409 07/04/20 0310  NA 144 141  K 3.6 3.1*  CL 118* 118*  CO2 16* 16*  GLUCOSE 78 99  BUN 21 24*  CREATININE 2.05* 1.83*  CALCIUM 7.3* 7.0*   PT/INR No results for input(s): LABPROT, INR in the last 72 hours.  Recent Labs  Lab 06/28/20 0355 07/04/20 0310  AST 14* 14*  ALT 11 9  ALKPHOS 102 91  BILITOT 0.5 0.5  PROT 4.5* 4.5*  ALBUMIN 1.9* 1.9*     Lipase     Component Value Date/Time   LIPASE 27 06/11/2020 0123     Medications: . (feeding supplement) PROSource Plus  30 mL Oral BID BM  . acetaminophen  1,000 mg Oral Q6H  . Chlorhexidine Gluconate Cloth  6 each Topical Daily  . feeding supplement  1 Container Oral TID BM  . furosemide  20 mg Oral Daily  .  metoprolol succinate  12.5 mg Oral Daily  . midodrine  5 mg Oral TID WC  . multivitamin with minerals  1 tablet Oral Daily  . pantoprazole  40 mg Oral Daily  . sodium bicarbonate  650 mg Oral BID  . sodium chloride flush  3 mL Intravenous Q12H    Assessment/Plan SIRS secondary to UTI/GI bleed Tachy-Brady syndrome - PTVP 11/2014 Severe MR s/p repair with 28 mm annuloplasty ring/oversew LAA, 2003 Hx NSVT/chronic atrial fibrillation-not on anticoagulation secondary to GI bleed Hx CHF Hx pulmonary hypertension Anemia -H/H 7.6/26.3 Hx CKD with AKI - creatinine is up 1.18>>1.38>>1.64>>1.67 >> 1.74(8/11)>>2.05(8/16) Hx gastritis Hx recent balloon dilatation [name] Hx spinal stenosis Hx hypertension/hypertension-on midodrine at home Hydronephrosis with cystoscopy/left retrograde pyelogram/insertion of left double-J stent 06/11/2020;Dr. Jeffie Pollock Hx remote tobacco use Malnutrition - prealbumin 11 (8/3)  Cecal Mass -adenocarcinoma - S/p Colonoscopy 06/18/20, Dr. Michail Sermon >>path: adenocarcinoma - CT chest with possible metastatic lesion in the right lung apex  - [id number] (8/3) - S/p Lap Right Hemicolectomy 8/5 Dr Doloris Hall #12 - Path: Invasive moderately differentiated mucinous adenocarcinoma. - Med Onc has seen here in the hospital and reports they have arranged outpt follow up -Postop ileus resolved; tolerating regular diet - Wound open 8/16>> CT scan shows no signs of dehiscence  -  Wound VAC placement midline incision 07/03/2020 - Mobilize, Pulm toilet  - PT/OT, recommending SNF  TYV:DPBA diet and tolerating it well ID: No further abx indicated from a general surgery standpoint.  Zosyn 8/8-8/12 for PNA, TRHAugmentin 8/12-8/17 for UTI  Plan: We will tentatively plan to look at the wound VAC dressing tomorrow when it is change.  If it shows continued improvement she should be able to discharge later Thursday or Friday.  No drainage recorded from the wound VAC.  LOS: 23  days    Damita Eppard 07/04/2020 Please see Amion

## 2020-07-05 ENCOUNTER — Inpatient Hospital Stay (HOSPITAL_COMMUNITY): Payer: Medicare Other

## 2020-07-05 LAB — CBC WITH DIFFERENTIAL/PLATELET
Abs Immature Granulocytes: 0.07 10*3/uL (ref 0.00–0.07)
Basophils Absolute: 0 10*3/uL (ref 0.0–0.1)
Basophils Relative: 0 %
Eosinophils Absolute: 0.3 10*3/uL (ref 0.0–0.5)
Eosinophils Relative: 4 %
HCT: 26 % — ABNORMAL LOW (ref 36.0–46.0)
Hemoglobin: 7.3 g/dL — ABNORMAL LOW (ref 12.0–15.0)
Immature Granulocytes: 1 %
Lymphocytes Relative: 25 %
Lymphs Abs: 1.7 10*3/uL (ref 0.7–4.0)
MCH: 26 pg (ref 26.0–34.0)
MCHC: 28.1 g/dL — ABNORMAL LOW (ref 30.0–36.0)
MCV: 92.5 fL (ref 80.0–100.0)
Monocytes Absolute: 0.7 10*3/uL (ref 0.1–1.0)
Monocytes Relative: 10 %
Neutro Abs: 4.2 10*3/uL (ref 1.7–7.7)
Neutrophils Relative %: 60 %
Platelets: 146 10*3/uL — ABNORMAL LOW (ref 150–400)
RBC: 2.81 MIL/uL — ABNORMAL LOW (ref 3.87–5.11)
RDW: 22.5 % — ABNORMAL HIGH (ref 11.5–15.5)
WBC: 7 10*3/uL (ref 4.0–10.5)
nRBC: 0 % (ref 0.0–0.2)

## 2020-07-05 LAB — COMPREHENSIVE METABOLIC PANEL
ALT: 9 U/L (ref 0–44)
AST: 15 U/L (ref 15–41)
Albumin: 1.9 g/dL — ABNORMAL LOW (ref 3.5–5.0)
Alkaline Phosphatase: 92 U/L (ref 38–126)
Anion gap: 6 (ref 5–15)
BUN: 22 mg/dL (ref 8–23)
CO2: 18 mmol/L — ABNORMAL LOW (ref 22–32)
Calcium: 7.2 mg/dL — ABNORMAL LOW (ref 8.9–10.3)
Chloride: 122 mmol/L — ABNORMAL HIGH (ref 98–111)
Creatinine, Ser: 1.85 mg/dL — ABNORMAL HIGH (ref 0.44–1.00)
GFR calc Af Amer: 27 mL/min — ABNORMAL LOW (ref >60)
GFR calc non Af Amer: 23 mL/min — ABNORMAL LOW (ref >60)
Glucose, Bld: 92 mg/dL (ref 70–99)
Potassium: 4 mmol/L (ref 3.5–5.1)
Sodium: 146 mmol/L — ABNORMAL HIGH (ref 135–145)
Total Bilirubin: 0.6 mg/dL (ref 0.3–1.2)
Total Protein: 4.5 g/dL — ABNORMAL LOW (ref 6.5–8.1)

## 2020-07-05 LAB — MAGNESIUM: Magnesium: 1.9 mg/dL (ref 1.7–2.4)

## 2020-07-05 MED ORDER — LEVALBUTEROL HCL 0.63 MG/3ML IN NEBU
0.6300 mg | INHALATION_SOLUTION | Freq: Three times a day (TID) | RESPIRATORY_TRACT | Status: DC
  Administered 2020-07-05: 0.63 mg via RESPIRATORY_TRACT
  Filled 2020-07-05: qty 3

## 2020-07-05 MED ORDER — FUROSEMIDE 10 MG/ML IJ SOLN
20.0000 mg | Freq: Two times a day (BID) | INTRAMUSCULAR | Status: DC
  Administered 2020-07-05 – 2020-07-06 (×3): 20 mg via INTRAVENOUS
  Filled 2020-07-05 (×3): qty 2

## 2020-07-05 MED ORDER — MAGNESIUM SULFATE 2 GM/50ML IV SOLN
2.0000 g | Freq: Once | INTRAVENOUS | Status: AC
  Administered 2020-07-05: 2 g via INTRAVENOUS
  Filled 2020-07-05: qty 50

## 2020-07-05 MED ORDER — FUROSEMIDE 10 MG/ML IJ SOLN: 20.0000 mg | Freq: Two times a day (BID) | INTRAMUSCULAR | Status: DC

## 2020-07-05 MED ORDER — CALCIUM CARBONATE-VITAMIN D 500-200 MG-UNIT PO TABS
1.0000 | ORAL_TABLET | Freq: Three times a day (TID) | ORAL | Status: DC
  Administered 2020-07-05 – 2020-07-14 (×28): 1 via ORAL
  Filled 2020-07-05 (×28): qty 1

## 2020-07-05 MED ORDER — LEVALBUTEROL HCL 0.63 MG/3ML IN NEBU
0.6300 mg | INHALATION_SOLUTION | Freq: Two times a day (BID) | RESPIRATORY_TRACT | Status: DC
  Filled 2020-07-05: qty 3

## 2020-07-05 MED ORDER — FUROSEMIDE 10 MG/ML IJ SOLN
20.0000 mg | Freq: Two times a day (BID) | INTRAMUSCULAR | Status: DC
  Administered 2020-07-05: 20 mg via INTRAVENOUS
  Filled 2020-07-05: qty 2

## 2020-07-05 NOTE — TOC Progression Note (Signed)
Transition of Care [facility]) - Progression Note    Patient Details  Name: [name] MRN: [id number] Date of Birth: 1926-01-03  [facility] Care [facility]) CM/SW Contact  Quanah Majka, Juliann Pulse, RN Phone Number: 07/05/2020, 12:13 PM  Clinical Narrative:Patient not medically stable for d/c.       Expected Discharge Plan: [place] Barriers to Discharge: Continued Medical Work up  Expected Discharge Plan and Services Expected Discharge Plan: [place]   Discharge Planning Services: CM Consult Post Acute Care Choice: [place] Living arrangements for the past 2 months: [place]                                       Social Determinants of Health (SDOH) Interventions    Readmission Risk Interventions No flowsheet data found.

## 2020-07-05 NOTE — Progress Notes (Signed)
PROGRESS NOTE    [name]  OMA:[id number] DOB: 08/30/1926 DOA: 06/11/2020 PCP: [name], MD   No chief complaint on file.   Brief Narrative:  86 year old lady with prior history of tachybradycardia syndrome s/p biventricular pacemaker placement in 2007, severe MR s/p repair, chronic atrial fibrillation not on anticoagulation secondary to GI bleed, abnormal esophageal motility consistent with presbyesophagus, gastritis on last endoscopy with balloon dilatation by [name], pulmonary hypertension presents with 2 days of dysuria, and abdominal pain, black stools for several weeks, and increased confusion admitted for sepsis secondary to urinary tract infection in the setting of obstructive uropathy and acute on chronic anemia.  CT of the abdomen and pelvis shows left-sided moderate hydronephrosis with intrarenal calculi bilaterally, left ureteral calculi and soft tissue fullness in the cecal area.  Patient underwent ureteral stent placement by urology on 06/11/2020.  And was started on IV Rocephin and admitted to [facility] service for further evaluation and management. Urine culture showed lactobacillus transition IV Rocephin to oral amoxicillin for another 2 weeks to complete the course. [name] with Urology suggested he will post pone the stent removal to a later date. She underwent CT abd showing cecal mass concerning for malignancy. She underwent colonoscopy on 06/18/20 by [name] showing large cecal mass concerning for malignancy. Gen surgery consulted for further management. Cecal mass pathology shows adenocarcinoma. Cardiology consulted for pre op clearance as she might need surgical intervention.  Oncology consulted who recommended CT chest for further staging.  Patient underwent surgery 06/21/2020 with pathology consistent with adenocarcinoma involving the cecum invading into subserosal area without lymphovascular or perineural invasion, 20 lymph nodes were negative for CA-CEA was 575  on 06/19/2020.  Hospital course complicated by probable ileus as well as multiple dark watery stools, patient reassessed by surgery and Reglan added.  Patient diet advanced to a solid diet since 06/30/2020 which has been tolerating.  Patient noted to have some oozing had a subcutaneous hematoma removed 07/02/2020 and wound VAC placed over base of incision. Patient also noted to have some runs of nonsustained V. tach..   Assessment & Plan:   Principal Problem:   Acute lower UTI Active Problems:   Liver cirrhosis (HCC)   CKD (chronic kidney disease) stage 3, GFR 30-59 ml/min   Anemia   Biventricular cardiac pacemaker in situ   Permanent atrial fibrillation (HCC)   Chronic diastolic CHF (congestive heart failure) (HCC)   Gout   S/P MVR (mitral valve repair)   AKI (acute kidney injury) ([place])   Orthostatic hypotension dysautonomic syndrome (HCC)   GI bleeding   Obstructive uropathy   Sepsis secondary to UTI ([place])   Hydronephrosis of left kidney   Left ureteral stone   Advanced care planning/counseling discussion   Goals of care, counseling/discussion   Palliative care by specialist   DNR (do not resuscitate)   Acute on chronic renal insufficiency   Iron deficiency anemia due to chronic blood loss   Cancer of cecum (HCC)   Emesis   Gastritis with hemorrhage   Acute on chronic diastolic CHF (congestive heart failure) ([place])   Ileus (HCC)  1 SIRS/Sepsis secondary to UTI Patient on admission met criteria for systemic inflammatory response with, MAP < 65, AKI, urinalysis worrisome for UTI.  Urine cultures positive for lactobacillus.  Patient noted to have left-sided moderate hydronephrosis secondary to left ureteral stone and underwent left ureteral stent placement 06/11/2020.  Patient was on IV Rocephin which has subsequently been discontinued.  Amoxicillin started 06/20/2020  to complete a 14-day course of antibiotic treatment as patient with stent placement and left urethral stone.  Patient  due to emesis and concern for possible aspiration pneumonia amoxicillin discontinued and patient placed on IV Zosyn.  Patient improved clinically was transitioned to Augmentin and completed a full course of antibiotic treatment.  Afebrile.  Follow.   2.  Obstructive uropathy with left-sided moderate hydronephrosis secondary to left ureteral stone Noted on CT abdomen and pelvis.  Patient seen in consultation by urology and underwent left ureteral stent placement 06/11/2020.  Foley catheter initially discontinued and placed back in perioperatively.  Foley catheter has been discontinued. Urine cultures positive for lactobacillus.  Patient on IV Rocephin which was subsequently discontinued.  Amoxicillin started 06/20/2020 to complete a 10 - 14-day course of antibiotic treatment.  Patient with good urine output.  Clinical improvement.  Per urology reschedule uteroscopy for about 2 to 3 weeks out to give patient opportunity to recover from pending colon resection.  Patient with bout of nausea and emesis 06/24/2020, concern for aspiration pneumonia and as such antibiotic were changed to IV Zosyn.  IV Zosyn was subsequently transitioned to Augmentin and patient completed full course of antibiotic treatment.  No further antibiotics needed.  Outpatient follow-up with urology.   3.  Cecal adenocarcinoma Soft tissue fullness noted in cecal area on CT abdomen and pelvis.  Repeat CT abdomen and pelvis showing a lobulated mass in the cecum concerning for malignancy.  GI consulted and patient underwent colonoscopy with biopsy on 06/18/2020 per Dr. Michail Sermon with pathology positive for adenocarcinoma.  Patient seen in consultation by general surgery to see whether she is a candidate for diverting colostomy.  Patient seen in consultation by cardiology for preop clearance.  Patient currently not obstructed at this time.  CT chest with interval development of 10 mm subpleural rounded density seen posteriorly in the right lung apex.   Follow-up unenhanced chest CT in 3 months recommended to ensure stability or resolution.  Patient s/p laparoscopic right colectomy 06/22/2020.  Oncology consulted and are following and will evaluate postop to determine if further adjuvant treatment is feasible or warranted.  Palliative care also following.  Patient was tolerating a clear liquid diet and diet advanced to a soft diet.  Patient noted to have nausea and emesis the afternoon of 06/24/2020 and placed on a clear liquid diet.  Abdominal films obtained concerning for ileus versus SBO.  Ileus resolved.  Currently tolerating a soft diet.  Will likely follow-up with oncology in the outpatient setting.  Appreciate general surgery, oncology, palliative care input and recommendations.  4.  Fascial deficit status post hematoma removal from base of wound 07/02/2020 Patient being followed by wound care nurse as well as general surgery.  Wound VAC placed.  Per general surgery.  5.  Nausea and emesis/?  Ileus versus SBO Patient noted to have nausea and emesis the afternoon of 06/24/2020 after being started on a soft diet.  Abdominal films obtained concerning for ileus versus SBO bowel gas pattern.  Patient was started on Reglan.  Patient having bowel movements.  Ileus resolved.  Tolerating diet.  Per general surgery.  Supportive care.   6.??  Aspiration pneumonia Patient with bout of nausea and emesis on 06/24/2020.  Acute abdominal series done with left pleural effusion with left lower lobe atelectasis or infiltrate, cardiomegaly, increasing right basilar atelectasis or infiltrate.  Amoxicillin was discontinued patient was placed on IV Zosyn.  Patient subsequently transitioned from IV Zosyn to Augmentin and has completed  a full course of antibiotic treatment.  No further antibiotics needed.   7.  Probable acute on chronic  combined systolic diastolic heart failure Patient with some expiratory wheezing, 1-2+ bilateral lower extremity edema.  Patient is +10 L during  this hospitalization however unsure of accuracy of I's and O's.  Patient noted to have a weight of f 94 kg from 93.6 kg (07/03/2020) from 87.1 kg (07/02/2020).  2D echo from May 2021 with a EF of 40%.  Patient denies any significant shortness of breath however noted to have expiratory wheezing..  Patient received a dose of IV Lasix on 06/21/2020.  Patient on Lasix 20 mg daily however was on Demadex prior to admission.  Hold Demadex today.  Placed on Lasix 20 mg IV every 12 hours.  Strict I's and O's.  Daily weights.  Check a chest x-ray.  Could likely resume Demadex tomorrow instead of oral Lasix.  Follow.   8.  Chronic hypotension Midodrine.   9.  Permanent atrial fibrillation Currently rate controlled on beta-blocker..  Patient not a anticoagulation candidate secondary to history of GI bleed.  Cardiology was following however have signed off.   10.  Tachybradycardia syndrome with BiV PPM/nonsustained V. tach Stable.  Last interrogation April 2021 patient noted to be V pacing.  Patient noted to have a bout of nonsustained V. tach this morning per RN.  Potassium at 3.1.  Keep potassium > 4.  Keep magnesium >2. Follow.   11.  Acute on chronic kidney disease stage IIIb Likely post renal azotemia in the setting of obstructive uropathy in the setting of sepsis and relative hypotension on admission.  Creatinine noted to have peaked at 3 and trended down.  Creatinine currently at 1.85.  Urine output 650 cc over the past 24 hours in addition to some unmeasured urine output..  Patient received a dose of IV Lasix on 06/21/2020.  Demadex was resumed however held due to nausea and vomiting.  Creatinine with a slight bump however close to baseline.  Patient was on oral Lasix 20 mg daily, however patient with some expiratory wheezing and some lower extremity edema due to concerns for volume overload.  Patient noted to have been +10 L during this hospitalization however doubt accuracy of I's and O's.  Patient given a dose  of Lasix 20 mg IV x1 on 07/04/2020.  Hold Demadex today.  Check a chest x-ray.  Patient supposedly is +10.148 L during this hospitalization however unsure about accuracy of I's and O's.  Current weight of 94 kg from 93.6 kg (07/03/2020) from 87.1 kg (07/02/2020). Place on Lasix 20 mg IV every 12 hours. Follow.   12.  Hypernatremia Likely secondary to free water deficit secondary to poor oral intake.  Slight bump in sodium level.  Diuretics initially held and subsequently patient restarted on low-dose Lasix 20 mg daily.  Concern for volume overload and as such patient on IV Lasix over the next 24 hours.  Could likely resume home regimen Demadex tomorrow.  Follow.   13.  Iron deficiency anemia secondary to colonic cancer bleed/anemia of chronic disease Status post recent EGD showing gastritis and presbyesophagus.  Baseline hemoglobin approximately 9.  On admission hemoglobin noted to be 7.9.  Status post 1 unit transfusion packed red blood cells hemoglobin currently at 7.3 and currently stable.  Status post IV Feraheme 510 mg x 1( 06/24/2020) and 06/30/2020.  Transfusion threshold hemoglobin < 7.  14.  Vitamin D deficiency Os-Cal with vitamin D 3 times daily.  Outpatient follow-up.     DVT prophylaxis: SCDs.  History of GI bleed. Code Status: full Family Communication: Updated daughter at bedside. Disposition:   Status is: Inpatient    Dispo: The patient is from: Assisted living facility              Anticipated d/c is to: SNF              Anticipated d/c date is: 1 to 2 days.              Patient currently worked up for cecal cancer and s/p laparoscopic right colectomy 06/22/2020.  Patient with ileus which has since resolved.  Patient with expiratory wheezing concern for volume overload.  Patient being diuresed and currently on IV Lasix.  General surgery following.  Oncology was following.  Patient not stable for discharge.        Consultants:   General surgery: Dr.Toth III  06/19/2020  Cardiology: Dr. Radford Pax 06/19/2020  Gastroenterology: Dr. Watt Climes 06/11/2020  Urology: Dr. Jeffie Pollock 06/11/2020  Oncology Dr. Lorenso Courier 06/20/2020  Palliative care: Dr. Rowe Pavy 06/20/2020  Procedures:  CT chest 06/28/2020  CT abdomen and pelvis 06/15/2020, 06/11/2020, 07/02/2020  Chest x-ray 06/11/2020  Colonoscopy 06/18/2020--per Dr. Michail Sermon gastroenterology  Cystoscopy with left retrograde pyelogram and interpretation/cystoscopy with insertion of left double-J stent per Dr.Wrenn 06/11/2020  Transfusion 1 unit packed red blood cells 06/11/2020  Laparoscopic right colectomy by Dr. Marlou Starks III 06/22/2020  Evacuation of subcutaneous hematoma per general surgery 07/02/2020  Antimicrobials:  IV Rocephin 06/11/2020>>>> 06/14/2020  Amoxicillin 06/20/2020>>>>> 06/24/2020  IV Zosyn 06/24/2020>>>>> 06/28/2020  Augmentin 06/28/2020>>>> 07/03/2020   Subjective: Patient sitting up in bed.  Denies any significant shortness of breath.  No chest pain.  Denies any significant abdominal pain.  Tolerating current diet.  Daughter at bedside.   Objective: Vitals:   07/04/20 2152 07/05/20 0449 07/05/20 1002 07/05/20 1151  BP: (!) 103/49 (!) 90/37 (!) 103/39 (!) 98/41  Pulse: 70 68 69 69  Resp: (!) 22 20 (!) 26 (!) 26  Temp: 97.8 F (36.6 C) 97.9 F (36.6 C) 97.8 F (36.6 C) 98.2 F (36.8 C)  TempSrc: Oral Oral Oral Oral  SpO2: 97% 97% 98% 98%  Weight:      Height:        Intake/Output Summary (Last 24 hours) at 07/05/2020 1204 Last data filed at 07/04/2020 1856 Gross per 24 hour  Intake 360 ml  Output 650 ml  Net -290 ml   Filed Weights   07/01/20 0331 07/02/20 0359 07/03/20 0500  Weight: 87.3 kg 87.1 kg 93.6 kg    Examination:  General exam: NAD. Respiratory system: Decreased expiratory wheezing.  Some minimal bibasilar crackles.  Upper airway noise.  No use of accessory muscles of respiration.  Speaking in full sentences.  Cardiovascular system: Regular rate and rhythm no murmurs rubs or  gallops.  No JVD. 1+ bilateral lower extremity edema.  Gastrointestinal system: Abdomen is soft, obese, nontender to palpation.  Wound VAC noted over mid abdominal region.  Positive bowel sounds.  No rebound.  No guarding.  Central nervous system: Alert and oriented.  Moving extremities spontaneously.  Extremities: Symmetric 5 x 5 power. Skin: No rashes, lesions or ulcers Psychiatry: Judgement and insight appear fair. Mood & affect appropriate.     Data Reviewed: I have personally reviewed following labs and imaging studies  CBC: Recent Labs  Lab 06/29/20 0335 07/01/20 0535 07/02/20 0409 07/04/20 0310 07/05/20 0415  WBC 6.2 6.2 6.9 6.8 7.0  NEUTROABS  4.3 4.1 4.3 4.1 4.2  HGB 7.5* 7.3* 7.6* 7.4* 7.3*  HCT 26.0* 25.7* 26.3* 26.2* 26.0*  MCV 89.0 91.5 91.3 92.6 92.5  PLT 169 166 160 154 146*    Basic Metabolic Panel: Recent Labs  Lab 06/29/20 0335 07/01/20 0535 07/02/20 0409 07/04/20 0310 07/04/20 1109 07/05/20 0415  NA 143 145 144 141  --  146*  K 3.6 3.2* 3.6 3.1*  --  4.0  CL 114* 117* 118* 118*  --  122*  CO2 19* 17* 16* 16*  --  18*  GLUCOSE 83 85 78 99  --  92  BUN _0 24*  --  22  CREATININE 1.90* 1.96* 2.05* 1.83*  --  1.85*  CALCIUM 7.5* 7.4* 7.3* 7.0*  --  7.2*  MG  --  2.0 1.9 2.0  --  1.9  PHOS  --   --   --   --  3.2  --     GFR: Estimated Creatinine Clearance: 21.1 mL/min (A) (by C-G formula based on SCr of 1.85 mg/dL (H)).  Liver Function Tests: Recent Labs  Lab 07/04/20 0310 07/05/20 0415  AST 14* 15  ALT 9 9  ALKPHOS 91 92  BILITOT 0.5 0.6  PROT 4.5* 4.5*  ALBUMIN 1.9* 1.9*    CBG: No results for input(s): GLUCAP in the last 168 hours.   Recent Results (from the past 240 hour(s))  Urine Culture     Status: None   Collection Time: 07/02/20 10:57 AM   Specimen: Urine, Catheterized  Result Value Ref Range Status   Specimen Description   Final    URINE, CATHETERIZED Performed at [facility] [address] [place], [place] [address]    Special Requests   Final    NONE Performed at [facility], [place] [address], [place], [place] [address]    Culture   Final    NO GROWTH Performed at [facility], [place] [address], [place],  [address]    Report Status 07/03/2020 FINAL  Final         Radiology Studies: No results found.      Scheduled Meds: . (feeding supplement) PROSource Plus  30 mL Oral BID BM  . acetaminophen  1,000 mg Oral Q6H  . calcium-vitamin D  1 tablet Oral TID  . Chlorhexidine Gluconate Cloth  6 each Topical Daily  . feeding supplement  1 Container Oral TID BM  . furosemide  20 mg Intravenous Q12H  . metoprolol succinate  12.5 mg Oral Daily  . midodrine  5 mg Oral TID WC  . multivitamin with minerals  1 tablet Oral Daily  . pantoprazole  40 mg Oral Daily  . sodium bicarbonate  650 mg Oral BID  . sodium chloride flush  3 mL Intravenous Q12H  . torsemide  20 mg Oral Daily   Continuous Infusions: . magnesium sulfate bolus IVPB 2 g (07/05/20 1201)     LOS: 24 days    Time spent: 35 minutes    Irine Seal, MD [facility]   To contact the attending provider between 7A-7P or the covering provider during after hours 7P-7A, please log into the web site [email or web address] and access using universal [place] password for that web site. If you do not have the password, please call the hospital operator.  07/05/2020, 12:04 PM

## 2020-07-05 NOTE — TOC Progression Note (Addendum)
Transition of Care [place]) - Progression Note    Patient Details  Name: [name] MRN: [id number] Date of Birth: 1926-01-09  [facility]) CM/SW Contact  Quang Thorpe, Juliann Pulse, RN Phone Number: 07/05/2020, 9:23 AM  Clinical Narrative:update:gen surgery will look @ wound & confirm if stable to d/c today. [facility] will confirm if they can order wound vac timely for d/c today. CM has sent insurance updated clinicals [id number] auth ends today.   Current auth cancelled. Informed attending/surgery of 24hr notice prior d/c/medical stability- [facility] rep will need to order wound vac,& CM will get auth, COVID needed test.      Expected Discharge Plan: [place] Barriers to Discharge: Continued Medical Work up  Expected Discharge Plan and Services Expected Discharge Plan: [place]   Discharge Planning Services: CM Consult Post Acute Care Choice: [place] arrangements for the past 2 months: [place]                                       Social Determinants of Health (SDOH) Interventions    Readmission Risk Interventions No flowsheet data found.

## 2020-07-05 NOTE — Progress Notes (Signed)
Report received from Ene A., RN. No change from initial pm assessment. Will continue to monitor and follow the POC.

## 2020-07-05 NOTE — Consult Note (Addendum)
[place] Nurse wound follow up Wound type: surgical  Measurement:11cm x 3.5cm x 1.5cm  Wound bed:100% clean early granulation tissue Drainage (amount, consistency, odor) minimal in the [facility] Iv cansiter Periwound:slightly macerated; protected with skin prep wipes Dressing procedure/placement/frequency: Removed old NPWT dressing (1pc of black foam, 1pc of white foam) periwound skin protected  Filled wound with  __1_ piece of black foam, Sealed NPWT dressing at 162mm HG Patient tolerated procedure well did not wish to have any pain medication prior to dressing change.   [place] for bedside nurse to change non-complex NPWT dressing  T/Th/Sat   Re consult if needed, will not follow at this time. Thanks  Jahna Liebert [employer], RN,CWOCN, CNS, [place] [phone])

## 2020-07-05 NOTE — Progress Notes (Signed)
  Wound assessed. Okay to continue vac. Discussed with CM. Okay for d/c from our standpoint. I have arranged follow up. Formal progress note to follow.   Alferd Apa, [facility] Surgery 11:37 AM, 07/05/2020

## 2020-07-05 NOTE — TOC Transition Note (Signed)
Transition of Care [facility]) - CM/SW Discharge Note   Patient Details  Name: [name] MRN: [id number] Date of Birth: 1926/01/20  Transition of Care [facility]) CM/SW Contact:  Dessa Phi, RN Phone Number: 07/05/2020, 11:56 AM   Clinical Narrative: Corey Harold for 5p pick up to [place], wound vac already @ SNF;auth ends today.Await covid results, & d/c summary.      Final next level of care: Skilled Nursing Facility Barriers to Discharge: No Barriers Identified   Patient Goals and CMS Choice Patient states their goals for this hospitalization and ongoing recovery are:: go to rehab CMS Medicare.gov Compare Post Acute Care list provided to:: Patient Represenative (must comment) (dtr [place]) Choice offered to / list presented to : Adult Children  Discharge Placement              Patient chooses bed at: [facility]   Discharge Planning Services: CM Consult Post Acute Care Choice: [place]                               Social Determinants of Health (SDOH) Interventions     Readmission Risk Interventions No flowsheet data found.

## 2020-07-05 NOTE — Progress Notes (Signed)
13 Days Post-Op    CC: Abdominal pain  Subjective: NAEO. BMx2 last 24h, tolerating diet. Pain controlled.  Objective: Vital signs in last 24 hours: Temp:  [97.8 F (36.6 C)-97.9 F (36.6 C)] 97.8 F (36.6 C) (08/19 1002) Pulse Rate:  [68-71] 69 (08/19 1002) Resp:  [16-26] 26 (08/19 1002) BP: (90-106)/(37-49) 103/39 (08/19 1002) SpO2:  [97 %-100 %] 98 % (08/19 1002) Last BM Date: 07/04/20 780 p.o. Urine x3 Stool x4 Afebrile blood pressure ranged from 133/57-99/45 Respiratory rate 16-30 K+ 3.1 Chloride 118 Creatinine 1.83 Calcium 7, albumin 1.9, total protein 4.5. WBC 6.8 H/H 7.4/26.2 Platelets 154,000 Intake/Output from previous day: 08/18 0701 - 08/19 0700 In: 600 [P.O.:600] Out: 650 [Urine:650] Intake/Output this shift: No intake/output data recorded.  General appearance: alert, cooperative and no distress Resp: clear to auscultation bilaterally Abd: Wound VAC taken in place with good seal. Will evaluate with WOC RN today.  Lab Results:  Recent Labs    07/04/20 0310 07/05/20 0415  WBC 6.8 7.0  HGB 7.4* 7.3*  HCT 26.2* 26.0*  PLT 154 146*    BMET Recent Labs    07/04/20 0310 07/05/20 0415  NA 141 146*  K 3.1* 4.0  CL 118* 122*  CO2 16* 18*  GLUCOSE 99 92  BUN 24* 22  CREATININE 1.83* 1.85*  CALCIUM 7.0* 7.2*   PT/INR No results for input(s): LABPROT, INR in the last 72 hours.  Recent Labs  Lab 07/04/20 0310 07/05/20 0415  AST 14* 15  ALT 9 9  ALKPHOS 91 92  BILITOT 0.5 0.6  PROT 4.5* 4.5*  ALBUMIN 1.9* 1.9*     Lipase     Component Value Date/Time   LIPASE 27 06/11/2020 0123     Medications: . (feeding supplement) PROSource Plus  30 mL Oral BID BM  . acetaminophen  1,000 mg Oral Q6H  . calcium-vitamin D  1 tablet Oral TID  . Chlorhexidine Gluconate Cloth  6 each Topical Daily  . feeding supplement  1 Container Oral TID BM  . metoprolol succinate  12.5 mg Oral Daily  . midodrine  5 mg Oral TID WC  . multivitamin with  minerals  1 tablet Oral Daily  . pantoprazole  40 mg Oral Daily  . sodium bicarbonate  650 mg Oral BID  . sodium chloride flush  3 mL Intravenous Q12H  . torsemide  20 mg Oral Daily    Assessment/Plan SIRS secondary to UTI/GI bleed Tachy-Brady syndrome - PTVP 11/2014 Severe MR s/p repair with 28 mm annuloplasty ring/oversew LAA, 2003 Hx NSVT/chronic atrial fibrillation-not on anticoagulation secondary to GI bleed Hx CHF Hx pulmonary hypertension Anemia -H/H 7.6/26.3 Hx CKD with AKI - creatinine is up 1.18>>1.38>>1.64>>1.67 >> 1.74(8/11)>>2.05(8/16) Hx gastritis Hx recent balloon dilatation [name] Hx spinal stenosis Hx hypertension/hypertension-on midodrine at home Hydronephrosis with cystoscopy/left retrograde pyelogram/insertion of left double-J stent 06/11/2020;Dr. Jeffie Pollock Hx remote tobacco use Malnutrition - prealbumin 11 (8/3)  Cecal Mass -adenocarcinoma - S/p Colonoscopy 06/18/20, Dr. Michail Sermon >>path: adenocarcinoma - CT chest with possible metastatic lesion in the right lung apex  - [id number] (8/3) - S/p Lap Right Hemicolectomy 8/5 Dr Doloris Hall #[id number]  - Path: Invasive moderately differentiated mucinous adenocarcinoma. - Med Onc has seen here in the hospital and reports they have arranged outpt follow up -Postop ileus resolved; tolerating regular diet - Wound open 8/16>> CT scan shows no signs of dehiscence  -Wound VAC placement midline incision 07/03/2020 - Mobilize, Pulm toilet  - PT/OT, recommending SNF  UXL:KGMW  diet and tolerating it well ID: No further abx indicated from a general surgery standpoint.  Zosyn 8/8-8/12 for PNA, TRHAugmentin 8/12-8/17 for UTI  Plan: midline wound stable. Stable for D/C from surgical perspective.   LOS: 24 days    [name] 07/05/2020 Please see Amion

## 2020-07-06 ENCOUNTER — Encounter (HOSPITAL_COMMUNITY): Payer: Self-pay

## 2020-07-06 LAB — COMPREHENSIVE METABOLIC PANEL
ALT: 10 U/L (ref 0–44)
AST: 16 U/L (ref 15–41)
Albumin: 2 g/dL — ABNORMAL LOW (ref 3.5–5.0)
Alkaline Phosphatase: 97 U/L (ref 38–126)
Anion gap: 8 (ref 5–15)
BUN: 26 mg/dL — ABNORMAL HIGH (ref 8–23)
CO2: 19 mmol/L — ABNORMAL LOW (ref 22–32)
Calcium: 7.3 mg/dL — ABNORMAL LOW (ref 8.9–10.3)
Chloride: 119 mmol/L — ABNORMAL HIGH (ref 98–111)
Creatinine, Ser: 1.66 mg/dL — ABNORMAL HIGH (ref 0.44–1.00)
GFR calc Af Amer: 31 mL/min — ABNORMAL LOW (ref >60)
GFR calc non Af Amer: 27 mL/min — ABNORMAL LOW (ref >60)
Glucose, Bld: 94 mg/dL (ref 70–99)
Potassium: 3.5 mmol/L (ref 3.5–5.1)
Sodium: 146 mmol/L — ABNORMAL HIGH (ref 135–145)
Total Bilirubin: 0.6 mg/dL (ref 0.3–1.2)
Total Protein: 4.6 g/dL — ABNORMAL LOW (ref 6.5–8.1)

## 2020-07-06 LAB — GLUCOSE, CAPILLARY: Glucose-Capillary: 95 mg/dL (ref 70–99)

## 2020-07-06 LAB — CBC WITH DIFFERENTIAL/PLATELET
Abs Immature Granulocytes: 0.07 10*3/uL (ref 0.00–0.07)
Basophils Absolute: 0 10*3/uL (ref 0.0–0.1)
Basophils Relative: 0 %
Eosinophils Absolute: 0.3 10*3/uL (ref 0.0–0.5)
Eosinophils Relative: 5 %
HCT: 26.5 % — ABNORMAL LOW (ref 36.0–46.0)
Hemoglobin: 7.5 g/dL — ABNORMAL LOW (ref 12.0–15.0)
Immature Granulocytes: 1 %
Lymphocytes Relative: 20 %
Lymphs Abs: 1.4 10*3/uL (ref 0.7–4.0)
MCH: 26.1 pg (ref 26.0–34.0)
MCHC: 28.3 g/dL — ABNORMAL LOW (ref 30.0–36.0)
MCV: 92.3 fL (ref 80.0–100.0)
Monocytes Absolute: 0.6 10*3/uL (ref 0.1–1.0)
Monocytes Relative: 9 %
Neutro Abs: 4.6 10*3/uL (ref 1.7–7.7)
Neutrophils Relative %: 65 %
Platelets: 162 10*3/uL (ref 150–400)
RBC: 2.87 MIL/uL — ABNORMAL LOW (ref 3.87–5.11)
RDW: 22.8 % — ABNORMAL HIGH (ref 11.5–15.5)
WBC: 7 10*3/uL (ref 4.0–10.5)
nRBC: 0 % (ref 0.0–0.2)

## 2020-07-06 MED ORDER — POTASSIUM CHLORIDE 20 MEQ PO PACK
40.0000 meq | PACK | Freq: Once | ORAL | Status: AC
  Administered 2020-07-06: 40 meq via ORAL
  Filled 2020-07-06: qty 2

## 2020-07-06 MED ORDER — LEVALBUTEROL HCL 0.63 MG/3ML IN NEBU: 0.6300 mg | INHALATION_SOLUTION | Freq: Four times a day (QID) | RESPIRATORY_TRACT | Status: DC | PRN

## 2020-07-06 NOTE — Progress Notes (Signed)
PROGRESS NOTE    [name]  MKJ:[id number] DOB: Jan 06, 1926 DOA: 06/11/2020 PCP: [name], MD   No chief complaint on file.   Brief Narrative:  86 year old lady with prior history of tachybradycardia syndrome s/p biventricular pacemaker placement in 2007, severe MR s/p repair, chronic atrial fibrillation not on anticoagulation secondary to GI bleed, abnormal esophageal motility consistent with presbyesophagus, gastritis on last endoscopy with balloon dilatation by [name], pulmonary hypertension presents with 2 days of dysuria, and abdominal pain, black stools for several weeks, and increased confusion admitted for sepsis secondary to urinary tract infection in the setting of obstructive uropathy and acute on chronic anemia.  CT of the abdomen and pelvis shows left-sided moderate hydronephrosis with intrarenal calculi bilaterally, left ureteral calculi and soft tissue fullness in the cecal area.  Patient underwent ureteral stent placement by urology on 06/11/2020.  And was started on IV Rocephin and admitted to [facility] service for further evaluation and management. Urine culture showed lactobacillus transition IV Rocephin to oral amoxicillin for another 2 weeks to complete the course. [name] with Urology suggested he will post pone the stent removal to a later date. She underwent CT abd showing cecal mass concerning for malignancy. She underwent colonoscopy on 06/18/20 by [name] showing large cecal mass concerning for malignancy. Gen surgery consulted for further management. Cecal mass pathology shows adenocarcinoma. Cardiology consulted for pre op clearance as she might need surgical intervention.  Oncology consulted who recommended CT chest for further staging.  Patient underwent surgery 06/21/2020 with pathology consistent with adenocarcinoma involving the cecum invading into subserosal area without lymphovascular or perineural invasion, 20 lymph nodes were negative for CA-CEA was 575  on 06/19/2020.  Hospital course complicated by probable ileus as well as multiple dark watery stools, patient reassessed by surgery and Reglan added.  Patient diet advanced to a solid diet since 06/30/2020 which has been tolerating.  Patient noted to have some oozing had a subcutaneous hematoma removed 07/02/2020 and wound VAC placed over base of incision. Patient also noted to have some runs of nonsustained V. tach..   Assessment & Plan:   Principal Problem:   Acute lower UTI Active Problems:   Liver cirrhosis (HCC)   CKD (chronic kidney disease) stage 3, GFR 30-59 ml/min   Anemia   Biventricular cardiac pacemaker in situ   Permanent atrial fibrillation (HCC)   Chronic diastolic CHF (congestive heart failure) (HCC)   Gout   S/P MVR (mitral valve repair)   AKI (acute kidney injury) ([place])   Orthostatic hypotension dysautonomic syndrome (HCC)   GI bleeding   Obstructive uropathy   Sepsis secondary to UTI ([place])   Hydronephrosis of left kidney   Left ureteral stone   Advanced care planning/counseling discussion   Goals of care, counseling/discussion   Palliative care by specialist   DNR (do not resuscitate)   Acute on chronic renal insufficiency   Iron deficiency anemia due to chronic blood loss   Cancer of cecum (HCC)   Emesis   Gastritis with hemorrhage   Acute on chronic diastolic CHF (congestive heart failure) ([place])   Ileus (HCC)  1 SIRS/Sepsis secondary to UTI Patient on admission met criteria for systemic inflammatory response with, MAP < 65, AKI, urinalysis worrisome for UTI.  Urine cultures positive for lactobacillus.  Patient noted to have left-sided moderate hydronephrosis secondary to left ureteral stone and underwent left ureteral stent placement 06/11/2020.  Patient was on IV Rocephin which has subsequently been discontinued.  Amoxicillin started 06/20/2020  to complete a 14-day course of antibiotic treatment as patient with stent placement and left urethral stone.  Patient  due to emesis and concern for possible aspiration pneumonia amoxicillin discontinued and patient placed on IV Zosyn.  Patient improved clinically was transitioned to Augmentin and completed a full course of antibiotic treatment.  Afebrile.  Follow.   2.  Obstructive uropathy with left-sided moderate hydronephrosis secondary to left ureteral stone Noted on CT abdomen and pelvis.  Patient seen in consultation by urology and underwent left ureteral stent placement 06/11/2020.  Foley catheter initially discontinued and placed back in perioperatively.  Foley catheter has been discontinued. Urine cultures positive for lactobacillus.  Patient on IV Rocephin which was subsequently discontinued.  Amoxicillin started 06/20/2020 to complete a 10 - 14-day course of antibiotic treatment.  Patient with good urine output.  Clinical improvement.  Per urology reschedule uteroscopy for about 2 to 3 weeks out to give patient opportunity to recover from pending colon resection.  Patient with bout of nausea and emesis 06/24/2020, concern for aspiration pneumonia and as such antibiotic were changed to IV Zosyn.  IV Zosyn was subsequently transitioned to Augmentin and patient completed full course of antibiotic treatment.  No further antibiotics needed.  Patient for stent removal on Tuesday, 07/10/2020 which likely will be done inpatient as patient still requiring medical management for volume overload.  Urology following.   3.  Cecal adenocarcinoma Soft tissue fullness noted in cecal area on CT abdomen and pelvis.  Repeat CT abdomen and pelvis showing a lobulated mass in the cecum concerning for malignancy.  GI consulted and patient underwent colonoscopy with biopsy on 06/18/2020 per Dr. Michail Sermon with pathology positive for adenocarcinoma.  Patient seen in consultation by general surgery to see whether she is a candidate for diverting colostomy.  Patient seen in consultation by cardiology for preop clearance.  Patient currently not  obstructed at this time.  CT chest with interval development of 10 mm subpleural rounded density seen posteriorly in the right lung apex.  Follow-up unenhanced chest CT in 3 months recommended to ensure stability or resolution.  Patient s/p laparoscopic right colectomy 06/22/2020.  Oncology consulted and are following and will evaluate postop to determine if further adjuvant treatment is feasible or warranted.  Palliative care also following.  Patient was tolerating a clear liquid diet and diet advanced to a soft diet.  Patient noted to have nausea and emesis the afternoon of 06/24/2020 and placed on a clear liquid diet.  Abdominal films obtained concerning for ileus versus SBO.  Ileus resolved.  Currently tolerating a soft diet.  Will likely follow-up with oncology in the outpatient setting.  Appreciate general surgery, oncology, palliative care input and recommendations.  4.  Fascial deficit status post hematoma removal from base of wound 07/02/2020 Patient being followed by wound care nurse as well as general surgery.  Wound VAC placed.  Per general surgery.  5.  Nausea and emesis/?  Ileus versus SBO Patient noted to have nausea and emesis the afternoon of 06/24/2020 after being started on a soft diet.  Abdominal films obtained concerning for ileus versus SBO bowel gas pattern.  Patient was started on Reglan.  Patient having bowel movements.  Ileus resolved.  Tolerating diet.  Per general surgery.  Supportive care.   6.??  Aspiration pneumonia Patient with bout of nausea and emesis on 06/24/2020.  Acute abdominal series done with left pleural effusion with left lower lobe atelectasis or infiltrate, cardiomegaly, increasing right basilar atelectasis or infiltrate.  Amoxicillin was discontinued patient was placed on IV Zosyn.  Patient subsequently transitioned from IV Zosyn to Augmentin and has completed a full course of antibiotic treatment.  No further antibiotics needed.   7.  Probable acute on chronic   combined systolic diastolic heart failure Patient with expiratory wheezing, 1-2+ bilateral lower extremity edema.  Patient is +10 L during this hospitalization however unsure of accuracy of I's and O's.  Patient noted to have a weight of 94.2 kg from 94 kg from 93.6 kg (07/03/2020) from 87.1 kg (07/02/2020).  2D echo from May 2021 with a EF of 40%.  Patient denies any significant shortness of breath however noted to have expiratory wheezing which is improving with diuresis..  Patient received a dose of IV Lasix on 06/21/2020.  Patient on Lasix 20 mg daily however was on Demadex prior to admission.  Patient placed on Lasix 20 mg IV every 12 hours which will continue for another 24 hours.  Continue to hold Demadex.  Strict I's and O's.  Daily weights.  Likely resume Demadex once patient has been transitioned off IV Lasix.  Follow.    8.  Chronic hypotension Midodrine.   9.  Permanent atrial fibrillation Currently rate controlled on beta-blocker..  Patient not a anticoagulation candidate secondary to history of GI bleed.  Cardiology was following however have signed off.   10.  Tachybradycardia syndrome with BiV PPM/nonsustained V. tach Stable.  Last interrogation April 2021 patient noted to be V pacing.  Patient noted to have a bout of nonsustained V. tach the morning of 07/05/2020 per RN.  Potassium currently at 3.5.  Keep potassium > 4.  Keep magnesium >2. Follow.   11.  Acute on chronic kidney disease stage IIIb Likely post renal azotemia in the setting of obstructive uropathy in the setting of sepsis and relative hypotension on admission.  Creatinine noted to have peaked at 3 and trended down.  Creatinine currently at 1.66.  Urine output not recorded over the past 24 hours.  Patient received a dose of IV Lasix on 06/21/2020.  Demadex was resumed however held due to nausea and vomiting.  Creatinine trending down with diuresis.  Patient was on oral Lasix 20 mg daily, however patient with some expiratory  wheezing and some lower extremity edema due to concerns for volume overload oral Lasix has been changed to IV Lasix.  Patient noted to have been +10 L during this hospitalization however doubt accuracy of I's and O's.  Patient given a dose of Lasix 20 mg IV x1 on 07/04/2020.  Patient supposedly is +10.148 L during this hospitalization however unsure about accuracy of I's and O's.  Continue IV Lasix for another 24 hours.  Current weight of 0.2 kg from 94 kg from 93.6 kg (07/03/2020) from 87.1 kg (07/02/2020). Follow.   12.  Hypernatremia Likely secondary to free water deficit secondary to poor oral intake.  Slight bump in sodium level.  Diuretics initially held and subsequently patient restarted on low-dose Lasix 20 mg daily.  Concern for volume overload and as such patient started on IV Lasix.  Continue IV Lasix for another 24 hours.  Likely resume Demadex after IV Lasix has been discontinued in the next 1 to 2 days.  Follow.   13.  Iron deficiency anemia secondary to colonic cancer bleed/anemia of chronic disease Status post recent EGD showing gastritis and presbyesophagus.  Baseline hemoglobin approximately 9.  On admission hemoglobin noted to be 7.9.  Status post 1 unit transfusion packed red  blood cells hemoglobin currently at 7.5 and currently stable.  Status post IV Feraheme 510 mg x 1( 06/24/2020) and 06/30/2020.  Transfusion threshold hemoglobin < 7.  14.  Vitamin D deficiency Continue Os-Cal with vitamin D 3 times daily.  Outpatient follow-up.     DVT prophylaxis: SCDs.  History of GI bleed. Code Status: full Family Communication: No family at bedside. Disposition:   Status is: Inpatient    Dispo: The patient is from: Assisted living facility              Anticipated d/c is to: SNF              Anticipated d/c date is: 07/11/2020              Patient currently worked up for cecal cancer and s/p laparoscopic right colectomy 06/22/2020.  Patient with ileus which has since resolved.  Patient  with expiratory wheezing concern for volume overload.  Patient being diuresed and currently on IV Lasix.  General surgery following.  Oncology was following.  Patient not stable for discharge.        Consultants:   General surgery: Dr.Toth III 06/19/2020  Cardiology: Dr. Mayford Knife 06/19/2020  Gastroenterology: Dr. Ewing Schlein 06/11/2020  Urology: Dr. Annabell Howells 06/11/2020  Oncology Dr. Leonides Schanz 06/20/2020  Palliative care: Dr. Linna Darner 06/20/2020  Procedures:  CT chest 06/28/2020  CT abdomen and pelvis 06/15/2020, 06/11/2020, 07/02/2020  Chest x-ray 06/11/2020  Colonoscopy 06/18/2020--per Dr. Bosie Clos gastroenterology  Cystoscopy with left retrograde pyelogram and interpretation/cystoscopy with insertion of left double-J stent per Dr.Wrenn 06/11/2020  Transfusion 1 unit packed red blood cells 06/11/2020  Laparoscopic right colectomy by Dr. Carolynne Edouard III 06/22/2020  Evacuation of subcutaneous hematoma per general surgery 07/02/2020  Antimicrobials:  IV Rocephin 06/11/2020>>>> 06/14/2020  Amoxicillin 06/20/2020>>>>> 06/24/2020  IV Zosyn 06/24/2020>>>>> 06/28/2020  Augmentin 06/28/2020>>>> 07/03/2020   Subjective: Patient laying in bed.  Denies any shortness of breath.  No chest pain.  No abdominal pain.  Tolerating current diet.    Objective: Vitals:   07/05/20 2032 07/05/20 2207 07/06/20 0205 07/06/20 0538  BP:  (!) 87/38 (!) 92/56 (!) 90/48  Pulse:  69 68 69  Resp:  (!) 22 (!) 22 (!) 22  Temp:  97.8 F (36.6 C) 97.6 F (36.4 C) 97.8 F (36.6 C)  TempSrc:  Oral Oral Oral  SpO2: 96% 98% 98% 97%  Weight:    94.2 kg  Height:       No intake or output data in the 24 hours ending 07/06/20 1116 Filed Weights   07/03/20 0500 07/05/20 1230 07/06/20 0538  Weight: 93.6 kg 94 kg 94.2 kg    Examination:  General exam: NAD. Respiratory system: Decreased breath sounds in the bases.  No use of accessory muscles of respiration.  Very minimal expiratory wheezing.  Speaking in full sentences.  Cardiovascular  system: RRR no murmurs rubs or gallops.  No JVD.  1+ bilateral lower extremity edema.  Gastrointestinal system: Abdomen is obese, soft, nontender to palpation.  Wound VAC over mid abdominal region.  Positive bowel sounds.  No rebound.  No guarding.  Central nervous system: Alert.  Moving extremities spontaneously.  No focal neurological deficits.  Extremities: Symmetric 5 x 5 power. Skin: No rashes, lesions or ulcers Psychiatry: Judgement and insight appear fair. Mood & affect appropriate.     Data Reviewed: I have personally reviewed following labs and imaging studies  CBC: Recent Labs  Lab 07/01/20 0535 07/02/20 0409 07/04/20 0310 07/05/20 0415 07/06/20 0500  WBC  6.2 6.9 6.8 7.0 7.0  NEUTROABS 4.1 4.3 4.1 4.2 4.6  HGB 7.3* 7.6* 7.4* 7.3* 7.5*  HCT 25.7* 26.3* 26.2* 26.0* 26.5*  MCV 91.5 91.3 92.6 92.5 92.3  PLT 166 160 154 146* 403    Basic Metabolic Panel: Recent Labs  Lab 07/01/20 0535 07/02/20 0409 07/04/20 0310 07/04/20 1109 07/05/20 0415 07/06/20 0500  NA 145 144 141  --  146* 146*  K 3.2* 3.6 3.1*  --  4.0 3.5  CL 117* 118* 118*  --  122* 119*  CO2 17* 16* 16*  --  18* 19*  GLUCOSE 85 78 99  --  92 94  BUN 22 21 24*  --  22 26*  CREATININE 1.96* 2.05* 1.83*  --  1.85* 1.66*  CALCIUM 7.4* 7.3* 7.0*  --  7.2* 7.3*  MG 2.0 1.9 2.0  --  1.9  --   PHOS  --   --   --  3.2  --   --     GFR: Estimated Creatinine Clearance: 23.6 mL/min (A) (by C-G formula based on SCr of 1.66 mg/dL (H)).  Liver Function Tests: Recent Labs  Lab 07/04/20 0310 07/05/20 0415 07/06/20 0500  AST 14* 15 16  ALT $Re'9 9 10  'Pcb$ ALKPHOS 91 92 97  BILITOT 0.5 0.6 0.6  PROT 4.5* 4.5* 4.6*  ALBUMIN 1.9* 1.9* 2.0*    CBG: No results for input(s): GLUCAP in the last 168 hours.   Recent Results (from the past 240 hour(s))  Urine Culture     Status: None   Collection Time: 07/02/20 10:57 AM   Specimen: Urine, Catheterized  Result Value Ref Range Status   Specimen Description   Final      URINE, CATHETERIZED Performed at [facility] [address], [place], [place] [address]    Special Requests   Final    NONE Performed at [facility], [place] [address] [place], [place] [address]    Culture   Final    NO GROWTH Performed at [facility], [place] [address], [place], [place] [address]    Report Status 07/03/2020 FINAL  Final         Radiology Studies: DG CHEST PORT 1 VIEW  Result Date: 07/05/2020 CLINICAL DATA:  Wheezing. EXAM: PORTABLE CHEST 1 VIEW COMPARISON:  06/22/2020. FINDINGS: Right IJ line in stable position. AICD in stable position. Prior cardiac valve replacement. Cardiomegaly. No pulmonary venous congestion. Persistent unchanged left pleural effusion. Underlying atelectasis and or infiltrate cannot be excluded. Mild right infrahilar infiltrate cannot be excluded. No pneumothorax. IMPRESSION: 1.  Right IJ line in stable position. 2. AICD in stable position. Cardiomegaly. No pulmonary venous congestion. 3. Persistent unchanged left pleural effusion. Underlying atelectasis and or infiltrate cannot be excluded. Mild right infrahilar infiltrate cannot be excluded. Electronically Signed   By: Marcello Moores  Register   On: 07/05/2020 13:06        Scheduled Meds: . (feeding supplement) PROSource Plus  30 mL Oral BID BM  . acetaminophen  1,000 mg Oral Q6H  . calcium-vitamin D  1 tablet Oral TID  . Chlorhexidine Gluconate Cloth  6 each Topical Daily  . feeding supplement  1 Container Oral TID BM  . furosemide  20 mg Intravenous BID  . metoprolol succinate  12.5 mg Oral Daily  . midodrine  5 mg Oral TID WC  . multivitamin with minerals  1 tablet Oral Daily  . pantoprazole  40 mg Oral Daily  .  sodium bicarbonate  650 mg Oral BID  . sodium chloride flush  3 mL Intravenous Q12H  . torsemide  20 mg Oral Daily   Continuous Infusions:    LOS: 25 days    Time spent: 35 minutes    Irine Seal, MD [facility]   To contact the attending provider between 7A-7P or the covering provider during after hours 7P-7A, please log into the web site [email or web address] and access using universal [place] password for that web site. If you do not have the password, please call the hospital operator.  07/06/2020, 11:16 AM

## 2020-07-06 NOTE — TOC Progression Note (Signed)
Transition of Care [facility]) - Progression Note    Patient Details  Name: [name] MRN: [id number] Date of Birth: [birth year]  Transition [facility] CM/SW Contact  Jacolby Risby, Juliann Pulse, RN Phone Number: 07/06/2020, 11:22 AM  Clinical Narrative:Not medically stable for d/c. Auth cancelled.Will need new auth once medically stable for d/c.[facility] notified-they already have the wound vac.       Expected Discharge Plan: [place] Barriers to Discharge: Continued Medical Work up  Expected Discharge Plan and Services Expected Discharge Plan: [place]   Discharge Planning Services: CM Consult Post Acute Care Choice: [place] Living arrangements for the past 2 months: [place]                                       Social Determinants of Health (SDOH) Interventions    Readmission Risk Interventions No flowsheet data found.

## 2020-07-06 NOTE — Progress Notes (Signed)
[facility] Progress Note  14 Days Post-Op  Subjective: CC-  Sitting up in bed eating breakfast. No complaints. Denies abdominal pain, nausea, vomiting. She has eaten nearly all her breakfast. BM yesterday.  Received lasix last night. Denies SOB.  Objective: Vital signs in last 24 hours: Temp:  [97.6 F (36.4 C)-98.2 F (36.8 C)] 97.8 F (36.6 C) (08/20 0538) Pulse Rate:  [68-70] 69 (08/20 0538) Resp:  [18-26] 22 (08/20 0538) BP: (87-111)/(38-89) 90/48 (08/20 0538) SpO2:  [87 %-98 %] 97 % (08/20 0538) Weight:  [94 kg-94.2 kg] 94.2 kg (08/20 0538) Last BM Date: 07/05/20  Intake/Output from previous day: No intake/output data recorded. Intake/Output this shift: No intake/output data recorded.  PE: Gen:  Alert, NAD, pleasant Pulm:  rate and effort normal Abd: vac to midline incision, Soft, NT/ND, +BS  Lab Results:  Recent Labs    07/05/20 0415 07/06/20 0500  WBC 7.0 7.0  HGB 7.3* 7.5*  HCT 26.0* 26.5*  PLT 146* 162   BMET Recent Labs    07/05/20 0415 07/06/20 0500  NA 146* 146*  K 4.0 3.5  CL 122* 119*  CO2 18* 19*  GLUCOSE 92 94  BUN 22 26*  CREATININE 1.85* 1.66*  CALCIUM 7.2* 7.3*   PT/INR No results for input(s): LABPROT, INR in the last 72 hours. CMP     Component Value Date/Time   NA 146 (H) 07/06/2020 0500   NA 146 (H) 05/30/2019 1332   K 3.5 07/06/2020 0500   CL 119 (H) 07/06/2020 0500   CO2 19 (L) 07/06/2020 0500   GLUCOSE 94 07/06/2020 0500   BUN 26 (H) 07/06/2020 0500   BUN 25 05/30/2019 1332   CREATININE 1.66 (H) 07/06/2020 0500   CREATININE 1.49 (H) 07/30/2016 1509   CALCIUM 7.3 (L) 07/06/2020 0500   PROT 4.6 (L) 07/06/2020 0500   ALBUMIN 2.0 (L) 07/06/2020 0500   AST 16 07/06/2020 0500   ALT 10 07/06/2020 0500   ALKPHOS 97 07/06/2020 0500   BILITOT 0.6 07/06/2020 0500   GFRNONAA 27 (L) 07/06/2020 0500   GFRAA 31 (L) 07/06/2020 0500   Lipase     Component Value Date/Time   LIPASE 27 06/11/2020 0123        Studies/Results: DG CHEST PORT 1 VIEW  Result Date: 07/05/2020 CLINICAL DATA:  Wheezing. EXAM: PORTABLE CHEST 1 VIEW COMPARISON:  06/22/2020. FINDINGS: Right IJ line in stable position. AICD in stable position. Prior cardiac valve replacement. Cardiomegaly. No pulmonary venous congestion. Persistent unchanged left pleural effusion. Underlying atelectasis and or infiltrate cannot be excluded. Mild right infrahilar infiltrate cannot be excluded. No pneumothorax. IMPRESSION: 1.  Right IJ line in stable position. 2. AICD in stable position. Cardiomegaly. No pulmonary venous congestion. 3. Persistent unchanged left pleural effusion. Underlying atelectasis and or infiltrate cannot be excluded. Mild right infrahilar infiltrate cannot be excluded. Electronically Signed   By: Marcello Moores  Register   On: 07/05/2020 13:06    Anti-infectives: Anti-infectives (From admission, onward)   Start     Dose/Rate Route Frequency Ordered Stop   06/28/20 1415  amoxicillin-clavulanate (AUGMENTIN) 500-125 MG per tablet 500 mg  Status:  Discontinued        1 tablet Oral 2 times daily 06/28/20 1316 07/03/20 1553   06/24/20 2200  piperacillin-tazobactam (ZOSYN) IVPB 3.375 g  Status:  Discontinued        3.375 g 12.5 mL/hr over 240 Minutes Intravenous Every 8 hours 06/24/20 2056 06/28/20 1316   06/22/20 0911  sodium chloride  0.9 % with cefoTEtan (CEFOTAN) ADS Med       Note to Pharmacy: Georgena Spurling   : cabinet override      06/22/20 0911 06/22/20 1020   06/21/20 1115  cefoTEtan (CEFOTAN) 2 g in sodium chloride 0.9 % 100 mL IVPB        2 g 200 mL/hr over 30 Minutes Intravenous On call to O.R. 06/20/20 1346 06/22/20 0559   06/20/20 2200  amoxicillin (AMOXIL) capsule 500 mg  Status:  Discontinued        500 mg Oral Every 8 hours 06/20/20 1950 06/24/20 0725   06/14/20 1000  amoxicillin (AMOXIL) capsule 500 mg        500 mg Oral Every 12 hours 06/14/20 0741 06/19/20 0959   06/11/20 1030  cefTRIAXone (ROCEPHIN) 1 g in  sodium chloride 0.9 % 100 mL IVPB  Status:  Discontinued        1 g 200 mL/hr over 30 Minutes Intravenous Every 24 hours 06/11/20 1021 06/14/20 0741   06/11/20 0300  cefTRIAXone (ROCEPHIN) 1 g in sodium chloride 0.9 % 100 mL IVPB        1 g 200 mL/hr over 30 Minutes Intravenous  Once 06/11/20 0259 06/11/20 0458       Assessment/Plan SIRS secondary to UTI/GI bleed Tachy-Brady syndrome - PTVP 11/2014 Severe MR s/p repair with 28 mm annuloplasty ring/oversew LAA, 2003 Hx NSVT/chronic atrial fibrillation-not on anticoagulation secondary to GI bleed Hx CHF Hx pulmonary hypertension Anemia -H/H 7.6/26.3 Hx CKD with AKI  Hx gastritis Hx recent balloon dilatation [name] Hx spinal stenosis Hx hypertension/hypertension-on midodrine at home Hydronephrosis with cystoscopy/left retrograde pyelogram/insertion of left double-J stent 06/11/2020;Dr. Jeffie Pollock Hx remote tobacco use Malnutrition - prealbumin 11 (8/3)  Cecal Mass -adenocarcinoma - S/p Colonoscopy 06/18/20, Dr. 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 - Med Onc has seen here in the hospital and reports they have arranged outpt follow up -Postop ileus resolved; tolerating soft diet and having bowel function -Wound open 8/16>> CT scan shows nosigns of dehiscence  -Wound VAC placement midline incision 07/03/2020 - Mobilize, Pulm toilet  - PT/OT, recommending SNF  [facility]:ENID diet ID: No further abx indicated from a general surgery standpoint. Zosyn 8/8-8/12 for PNA, TRHAugmentin 8/12-8/17 for UTI  Plan: Stable for discharge to SNF from surgical standpoint. Continue abdominal wound vac. Continue therapies. Encourage PO intake. Discharge instructions and follow up info on AVS.   LOS: 25 days    Wellington Hampshire,  [facility] 07/06/2020, 8:48 AM Please see Amion for pager number during day hours 7:00am-4:30pm

## 2020-07-07 DIAGNOSIS — E870 Hyperosmolality and hypernatremia: Secondary | ICD-10-CM

## 2020-07-07 LAB — RENAL FUNCTION PANEL
Albumin: 2.2 g/dL — ABNORMAL LOW (ref 3.5–5.0)
Anion gap: 6 (ref 5–15)
BUN: 32 mg/dL — ABNORMAL HIGH (ref 8–23)
CO2: 20 mmol/L — ABNORMAL LOW (ref 22–32)
Calcium: 7.1 mg/dL — ABNORMAL LOW (ref 8.9–10.3)
Chloride: 119 mmol/L — ABNORMAL HIGH (ref 98–111)
Creatinine, Ser: 1.7 mg/dL — ABNORMAL HIGH (ref 0.44–1.00)
GFR calc Af Amer: 30 mL/min — ABNORMAL LOW (ref >60)
GFR calc non Af Amer: 26 mL/min — ABNORMAL LOW (ref >60)
Glucose, Bld: 115 mg/dL — ABNORMAL HIGH (ref 70–99)
Phosphorus: 2.8 mg/dL (ref 2.5–4.6)
Potassium: 4 mmol/L (ref 3.5–5.1)
Sodium: 145 mmol/L (ref 135–145)

## 2020-07-07 LAB — BASIC METABOLIC PANEL
Anion gap: 8 (ref 5–15)
BUN: 29 mg/dL — ABNORMAL HIGH (ref 8–23)
CO2: 20 mmol/L — ABNORMAL LOW (ref 22–32)
Calcium: 7.2 mg/dL — ABNORMAL LOW (ref 8.9–10.3)
Chloride: 121 mmol/L — ABNORMAL HIGH (ref 98–111)
Creatinine, Ser: 1.66 mg/dL — ABNORMAL HIGH (ref 0.44–1.00)
GFR calc Af Amer: 31 mL/min — ABNORMAL LOW (ref >60)
GFR calc non Af Amer: 27 mL/min — ABNORMAL LOW (ref >60)
Glucose, Bld: 102 mg/dL — ABNORMAL HIGH (ref 70–99)
Potassium: 3.8 mmol/L (ref 3.5–5.1)
Sodium: 149 mmol/L — ABNORMAL HIGH (ref 135–145)

## 2020-07-07 LAB — HEMOGLOBIN AND HEMATOCRIT, BLOOD
HCT: 26.1 % — ABNORMAL LOW (ref 36.0–46.0)
Hemoglobin: 7.4 g/dL — ABNORMAL LOW (ref 12.0–15.0)

## 2020-07-07 MED ORDER — DEXTROSE 5 % IV SOLN: INTRAVENOUS | Status: DC

## 2020-07-07 MED ORDER — ALBUMIN HUMAN 25 % IV SOLN
25.0000 g | Freq: Once | INTRAVENOUS | Status: AC
  Administered 2020-07-07: 25 g via INTRAVENOUS
  Filled 2020-07-07: qty 100

## 2020-07-07 MED ORDER — DEXTROSE 5 % IV SOLN: INTRAVENOUS | Status: AC

## 2020-07-07 MED ORDER — ALBUMIN HUMAN 25 % IV SOLN
25.0000 g | Freq: Once | INTRAVENOUS | Status: DC
  Filled 2020-07-07: qty 100

## 2020-07-07 NOTE — Progress Notes (Addendum)
Weekend CSW received a handoff from pt's  1st shift RN CM from 07/06/20 who states if the MD plans to dc Monday (please check with Dr or  nurse for dc date) will need ins Auth to start 24hrs prior d/c and [place] already has the wound vac.  RN CM states to pass message on to [facility] coverage for Sunday.  11:55 AM CSW spoke to MD whom states possible D/C dates are: 8/24-8/25 (Tues-Wed).  CSW will continue to follow for D/C needs.  Alphonse Guild. Malvika Tung  MSW, LCSW, LCAS, CCS Transitions of Care Clinical Social Worker Care Coordination Department Ph: [phone]

## 2020-07-07 NOTE — Progress Notes (Signed)
PROGRESS NOTE    [name]  MKJ:[id number] DOB: Jan 06, 1926 DOA: 06/11/2020 PCP: [name], MD   No chief complaint on file.   Brief Narrative:  86 year old lady with prior history of tachybradycardia syndrome s/p biventricular pacemaker placement in 2007, severe MR s/p repair, chronic atrial fibrillation not on anticoagulation secondary to GI bleed, abnormal esophageal motility consistent with presbyesophagus, gastritis on last endoscopy with balloon dilatation by [name], pulmonary hypertension presents with 2 days of dysuria, and abdominal pain, black stools for several weeks, and increased confusion admitted for sepsis secondary to urinary tract infection in the setting of obstructive uropathy and acute on chronic anemia.  CT of the abdomen and pelvis shows left-sided moderate hydronephrosis with intrarenal calculi bilaterally, left ureteral calculi and soft tissue fullness in the cecal area.  Patient underwent ureteral stent placement by urology on 06/11/2020.  And was started on IV Rocephin and admitted to [facility] service for further evaluation and management. Urine culture showed lactobacillus transition IV Rocephin to oral amoxicillin for another 2 weeks to complete the course. [name] with Urology suggested he will post pone the stent removal to a later date. She underwent CT abd showing cecal mass concerning for malignancy. She underwent colonoscopy on 06/18/20 by [name] showing large cecal mass concerning for malignancy. Gen surgery consulted for further management. Cecal mass pathology shows adenocarcinoma. Cardiology consulted for pre op clearance as she might need surgical intervention.  Oncology consulted who recommended CT chest for further staging.  Patient underwent surgery 06/21/2020 with pathology consistent with adenocarcinoma involving the cecum invading into subserosal area without lymphovascular or perineural invasion, 20 lymph nodes were negative for CA-CEA was 575  on 06/19/2020.  Hospital course complicated by probable ileus as well as multiple dark watery stools, patient reassessed by surgery and Reglan added.  Patient diet advanced to a solid diet since 06/30/2020 which has been tolerating.  Patient noted to have some oozing had a subcutaneous hematoma removed 07/02/2020 and wound VAC placed over base of incision. Patient also noted to have some runs of nonsustained V. tach..   Assessment & Plan:   Principal Problem:   Acute lower UTI Active Problems:   Liver cirrhosis (HCC)   CKD (chronic kidney disease) stage 3, GFR 30-59 ml/min   Anemia   Biventricular cardiac pacemaker in situ   Permanent atrial fibrillation (HCC)   Chronic diastolic CHF (congestive heart failure) (HCC)   Gout   S/P MVR (mitral valve repair)   AKI (acute kidney injury) ([place])   Orthostatic hypotension dysautonomic syndrome (HCC)   GI bleeding   Obstructive uropathy   Sepsis secondary to UTI ([place])   Hydronephrosis of left kidney   Left ureteral stone   Advanced care planning/counseling discussion   Goals of care, counseling/discussion   Palliative care by specialist   DNR (do not resuscitate)   Acute on chronic renal insufficiency   Iron deficiency anemia due to chronic blood loss   Cancer of cecum (HCC)   Emesis   Gastritis with hemorrhage   Acute on chronic diastolic CHF (congestive heart failure) ([place])   Ileus (HCC)  1 SIRS/Sepsis secondary to UTI Patient on admission met criteria for systemic inflammatory response with, MAP < 65, AKI, urinalysis worrisome for UTI.  Urine cultures positive for lactobacillus.  Patient noted to have left-sided moderate hydronephrosis secondary to left ureteral stone and underwent left ureteral stent placement 06/11/2020.  Patient was on IV Rocephin which has subsequently been discontinued.  Amoxicillin started 06/20/2020  to complete a 14-day course of antibiotic treatment as patient with stent placement and left urethral stone.  Patient  due to emesis and concern for possible aspiration pneumonia amoxicillin discontinued and patient placed on IV Zosyn.  Patient improved clinically was transitioned to Augmentin and completed a full course of antibiotic treatment.  Afebrile.  Follow.   2.  Obstructive uropathy with left-sided moderate hydronephrosis secondary to left ureteral stone Noted on CT abdomen and pelvis.  Patient seen in consultation by urology and underwent left ureteral stent placement 06/11/2020.  Foley catheter initially discontinued and placed back in perioperatively.  Foley catheter has been discontinued. Urine cultures positive for lactobacillus.  Patient on IV Rocephin which was subsequently discontinued.  Amoxicillin started 06/20/2020 to complete a 10 - 14-day course of antibiotic treatment.  Patient with good urine output.  Clinical improvement.  Per urology reschedule uteroscopy for about 2 to 3 weeks out to give patient opportunity to recover from pending colon resection.  Patient with bout of nausea and emesis 06/24/2020, concern for aspiration pneumonia and as such antibiotic were changed to IV Zosyn.  IV Zosyn was subsequently transitioned to Augmentin and patient completed full course of antibiotic treatment.  No further antibiotics needed.  Patient for stent removal on Tuesday, 07/10/2020 which likely will be done inpatient as patient still requiring medical management for volume overload/hyponatremia.  Urology following.   3.  Cecal adenocarcinoma Soft tissue fullness noted in cecal area on CT abdomen and pelvis.  Repeat CT abdomen and pelvis showing a lobulated mass in the cecum concerning for malignancy.  GI consulted and patient underwent colonoscopy with biopsy on 06/18/2020 per Dr. Michail Sermon with pathology positive for adenocarcinoma.  Patient seen in consultation by general surgery to see whether she is a candidate for diverting colostomy.  Patient seen in consultation by cardiology for preop clearance.  Patient  currently not obstructed at this time.  CT chest with interval development of 10 mm subpleural rounded density seen posteriorly in the right lung apex.  Follow-up unenhanced chest CT in 3 months recommended to ensure stability or resolution.  Patient s/p laparoscopic right colectomy 06/22/2020.  Oncology consulted and are following and will evaluate postop to determine if further adjuvant treatment is feasible or warranted.  Palliative care also following.  Patient was tolerating a clear liquid diet and diet advanced to a soft diet.  Patient noted to have nausea and emesis the afternoon of 06/24/2020 and placed on a clear liquid diet.  Abdominal films obtained concerning for ileus versus SBO.  Ileus resolved.  Currently tolerating a soft diet.  Will likely follow-up with oncology in the outpatient setting.  Appreciate general surgery, oncology, palliative care input and recommendations.  4.  Fascial deficit status post hematoma removal from base of wound 07/02/2020 Patient being followed by wound care nurse as well as general surgery.  Wound VAC placed.  Per general surgery.  5.  Nausea and emesis/?  Ileus versus SBO Patient noted to have nausea and emesis the afternoon of 06/24/2020 after being started on a soft diet.  Abdominal films obtained concerning for ileus versus SBO bowel gas pattern.  Patient was started on Reglan.  Patient having bowel movements.  Ileus resolved.  Tolerating diet.  Per general surgery.  Supportive care.   6.??  Aspiration pneumonia Patient with bout of nausea and emesis on 06/24/2020.  Acute abdominal series done with left pleural effusion with left lower lobe atelectasis or infiltrate, cardiomegaly, increasing right basilar atelectasis or infiltrate.  Amoxicillin was discontinued patient was placed on IV Zosyn.  Patient subsequently transitioned from IV Zosyn to Augmentin and has completed a full course of antibiotic treatment.  No further antibiotics needed.   7.  Probable acute on  chronic  combined systolic diastolic heart failure Patient with expiratory wheezing, 1-2+ bilateral lower extremity edema over the past few days which has improved with diuresis..  Patient is +10 L during this hospitalization however unsure of accuracy of I's and O's.  Patient noted to have a weight of 93.6 kg from 94.2 kg from 94 kg from 93.6 kg (07/03/2020) from 87.1 kg (07/02/2020).  2D echo from May 2021 with a EF of 40%.  Patient denies any significant shortness of breath however noted to have expiratory wheezing which has since resolved with diuresis.  Patient received a dose of IV Lasix on 06/21/2020.  Patient on Lasix 20 mg daily however was on Demadex prior to admission.  Patient placed on Lasix 20 mg IV every 12 hours for the past 2 to 3 days.  Discontinue IV Lasix.  We will give a dose of IV albumin x1.  Continue to hold Demadex.  Strict I's and O's.  Daily weights.  Could likely resume Demadex in the next 1 to 3 days.  Follow.    8.  Chronic hypotension Midodrine.   9.  Permanent atrial fibrillation Currently rate controlled on beta-blocker..  Patient not a anticoagulation candidate secondary to history of GI bleed.  Cardiology was following however have signed off.   10.  Tachybradycardia syndrome with BiV PPM/nonsustained V. tach Stable.  Last interrogation April 2021 patient noted to be V pacing.  Patient noted to have a bout of nonsustained V. tach the morning of 07/05/2020 per RN.  Potassium currently at 3.8.  Keep potassium > 4.  Keep magnesium >2. Follow.   11.  Acute on chronic kidney disease stage IIIb Likely post renal azotemia in the setting of obstructive uropathy in the setting of sepsis and relative hypotension on admission.  Creatinine noted to have peaked at 3 and trended down.  Creatinine currently at 1.66.  Urine output not recorded over the past 24 hours.  Patient received a dose of IV Lasix on 06/21/2020.  Demadex was resumed however held due to nausea and vomiting.  Creatinine  trending down with diuresis.  Patient was on oral Lasix 20 mg daily, however patient with some expiratory wheezing and some lower extremity edema due to concerns for volume overload oral Lasix has been changed to IV Lasix.  Patient noted to have been +10 L during this hospitalization however doubt accuracy of I's and O's.  Patient given a dose of Lasix 20 mg IV x1 on 07/04/2020.  Patient supposedly is +10.148 L during this hospitalization however unsure about accuracy of I's and O's.  Patient with a bump in sodium levels.  DC IV Lasix.  Continue IV Lasix for another 24 hours.  Current weight of of 93.6 kg from 94.2 kg from 94 kg from 93.6 kg (07/03/2020) from 87.1 kg (07/02/2020). Follow.   12.  Hypernatremia Likely secondary to free water deficit secondary to poor oral intake.  Slight bump in sodium level.  Diuretics initially held and subsequently patient restarted on low-dose Lasix 20 mg daily.  Concern for volume overload and as such patient started on IV Lasix.  Sodium level now 149.  DC IV Lasix.  Gentle hydration D5W at 50 cc an hour for the next 12 hours.  Follow.  13.  Iron deficiency anemia secondary to colonic cancer bleed/anemia of chronic disease Status post recent EGD showing gastritis and presbyesophagus.  Baseline hemoglobin approximately 9.  On admission hemoglobin noted to be 7.9.  Status post 1 unit transfusion packed red blood cells hemoglobin currently at 7.4 and currently stable.  Status post IV Feraheme 510 mg x 1( 06/24/2020) and 06/30/2020.  Transfusion threshold hemoglobin < 7.  14.  Vitamin D deficiency Os-Cal with vitamin D 3 times daily.  Outpatient follow-up.      DVT prophylaxis: SCDs.  History of GI bleed. Code Status: full Family Communication: No family at bedside. Disposition:   Status is: Inpatient    Dispo: The patient is from: Assisted living facility              Anticipated d/c is to: SNF              Anticipated d/c date is: 07/11/2020               Patient currently worked up for cecal cancer and s/p laparoscopic right colectomy 06/22/2020.  Patient with ileus which has since resolved.  Patient with expiratory wheezing concern for volume overload which is improving.  Patient being diuresed and IV Lasix discontinued.  Patient hyponatremic. General surgery following.  Oncology was following.  Patient not stable for discharge.        Consultants:   General surgery: Dr.Toth III 06/19/2020  Cardiology: Dr. Radford Pax 06/19/2020  Gastroenterology: Dr. Watt Climes 06/11/2020  Urology: Dr. Jeffie Pollock 06/11/2020  Oncology Dr. Lorenso Courier 06/20/2020  Palliative care: Dr. Rowe Pavy 06/20/2020  Procedures:  CT chest 06/28/2020  CT abdomen and pelvis 06/15/2020, 06/11/2020, 07/02/2020  Chest x-ray 06/11/2020  Colonoscopy 06/18/2020--per Dr. Michail Sermon gastroenterology  Cystoscopy with left retrograde pyelogram and interpretation/cystoscopy with insertion of left double-J stent per Dr.Wrenn 06/11/2020  Transfusion 1 unit packed red blood cells 06/11/2020  Laparoscopic right colectomy by Dr. Marlou Starks III 06/22/2020  Evacuation of subcutaneous hematoma per general surgery 07/02/2020  Antimicrobials:  IV Rocephin 06/11/2020>>>> 06/14/2020  Amoxicillin 06/20/2020>>>>> 06/24/2020  IV Zosyn 06/24/2020>>>>> 06/28/2020  Augmentin 06/28/2020>>>> 07/03/2020   Subjective: Laying in bed.  Denies chest pain or shortness of breath.  No abdominal pain.  Tolerating current diet.    Objective: Vitals:   07/06/20 2046 07/07/20 0131 07/07/20 0500 07/07/20 0552  BP: (!) 103/45 (!) 91/52  (!) 88/48  Pulse: 69 70  67  Resp: _0 Temp: 97.8 F (36.6 C) 98.1 F (36.7 C)  97.7 F (36.5 C)  TempSrc: Oral Oral  Oral  SpO2: 100% 98%  99%  Weight:   93.6 kg   Height:        Intake/Output Summary (Last 24 hours) at 07/07/2020 1223 Last data filed at 07/07/2020 0950 Gross per 24 hour  Intake 240 ml  Output --  Net 240 ml   Filed Weights   07/05/20 1230 07/06/20 0538 07/07/20 0500  Weight:  94 kg 94.2 kg 93.6 kg    Examination:  General exam: NAD. Respiratory system: Decreased breath sounds in the bases.  Lungs clear to auscultation bilaterally.  No expiratory wheezing.  No use of accessory muscles of respiration.  Speaking in full sentences.  Cardiovascular system: Regular rate rhythm no murmurs rubs or gallops.  No JVD.  Trace lower extremity edema. Gastrointestinal system: Abdomen is soft, obese, nontender to palpation.  No rebound.  No guarding.  Positive bowel sounds.  Wound VAC over mid abdominal region.   Central nervous system:  Alert.  Moving extremities spontaneously.  No focal neurological deficits.  Extremities: Symmetric 5 x 5 power. Skin: No rashes, lesions or ulcers Psychiatry: Judgement and insight appear fair. Mood & affect appropriate.     Data Reviewed: I have personally reviewed following labs and imaging studies  CBC: Recent Labs  Lab 07/01/20 0535 07/01/20 0535 07/02/20 0409 07/04/20 0310 07/05/20 0415 07/06/20 0500 07/07/20 0400  WBC 6.2  --  6.9 6.8 7.0 7.0  --   NEUTROABS 4.1  --  4.3 4.1 4.2 4.6  --   HGB 7.3*   < > 7.6* 7.4* 7.3* 7.5* 7.4*  HCT 25.7*   < > 26.3* 26.2* 26.0* 26.5* 26.1*  MCV 91.5  --  91.3 92.6 92.5 92.3  --   PLT 166  --  160 154 146* 162  --    < > = values in this interval not displayed.    Basic Metabolic Panel: Recent Labs  Lab 07/01/20 0535 07/01/20 0535 07/02/20 0409 07/04/20 0310 07/04/20 1109 07/05/20 0415 07/06/20 0500 07/07/20 0400  NA 145   < > 144 141  --  146* 146* 149*  K 3.2*   < > 3.6 3.1*  --  4.0 3.5 3.8  CL 117*   < > 118* 118*  --  122* 119* 121*  CO2 17*   < > 16* 16*  --  18* 19* 20*  GLUCOSE 85   < > 78 99  --  92 94 102*  BUN 22   < > 21 24*  --  22 26* 29*  CREATININE 1.96*   < > 2.05* 1.83*  --  1.85* 1.66* 1.66*  CALCIUM 7.4*   < > 7.3* 7.0*  --  7.2* 7.3* 7.2*  MG 2.0  --  1.9 2.0  --  1.9  --   --   PHOS  --   --   --   --  3.2  --   --   --    < > = values in this interval  not displayed.    GFR: Estimated Creatinine Clearance: 23.5 mL/min (A) (by C-G formula based on SCr of 1.66 mg/dL (H)).  Liver Function Tests: Recent Labs  Lab 07/04/20 0310 07/05/20 0415 07/06/20 0500  AST 14* 15 16  ALT _0 ALKPHOS 91 92 97  BILITOT 0.5 0.6 0.6  PROT 4.5* 4.5* 4.6*  ALBUMIN 1.9* 1.9* 2.0*    CBG: Recent Labs  Lab 07/06/20 1623  GLUCAP 95     Recent Results (from the past 240 hour(s))  Urine Culture     Status: None   Collection Time: 07/02/20 10:57 AM   Specimen: Urine, Catheterized  Result Value Ref Range Status   Specimen Description   Final    URINE, CATHETERIZED Performed at [facility], [place] [address] [place], [place] [address]    Special Requests   Final    NONE Performed at [facility], [place] [address] [place], [place] [address]    Culture   Final    NO GROWTH Performed at [facility], [place] [address] [place], [place] [address]    Report Status 07/03/2020 FINAL  Final         Radiology Studies: DG CHEST PORT 1 VIEW  Result Date: 07/05/2020 CLINICAL DATA:  Wheezing. EXAM: PORTABLE CHEST 1 VIEW COMPARISON:  06/22/2020. FINDINGS: Right IJ line in stable position. AICD in stable position. Prior cardiac valve replacement.  Cardiomegaly. No pulmonary venous congestion. Persistent unchanged left pleural effusion. Underlying atelectasis and or infiltrate cannot be excluded. Mild right infrahilar infiltrate cannot be excluded. No pneumothorax. IMPRESSION: 1.  Right IJ line in stable position. 2. AICD in stable position. Cardiomegaly. No pulmonary venous congestion. 3. Persistent unchanged left pleural effusion. Underlying atelectasis and or infiltrate cannot be excluded. Mild right infrahilar infiltrate cannot be excluded. Electronically Signed   By: Marcello Moores  Register   On: 07/05/2020 13:06        Scheduled Meds: . (feeding supplement) PROSource Plus  30 mL Oral BID BM  . acetaminophen   1,000 mg Oral Q6H  . calcium-vitamin D  1 tablet Oral TID  . Chlorhexidine Gluconate Cloth  6 each Topical Daily  . feeding supplement  1 Container Oral TID BM  . metoprolol succinate  12.5 mg Oral Daily  . midodrine  5 mg Oral TID WC  . multivitamin with minerals  1 tablet Oral Daily  . pantoprazole  40 mg Oral Daily  . sodium bicarbonate  650 mg Oral BID  . sodium chloride flush  3 mL Intravenous Q12H  . torsemide  20 mg Oral Daily   Continuous Infusions: . dextrose 50 mL/hr at 07/07/20 0950     LOS: 26 days    Time spent: 35 minutes    Irine Seal, MD [facility]   To contact the attending provider between 7A-7P or the covering provider during after hours 7P-7A, please log into the web site [email or web address] and access using universal [place] password for that web site. If you do not have the password, please call the hospital operator.  07/07/2020, 12:23 PM

## 2020-07-07 NOTE — Progress Notes (Signed)
Physical Therapy Treatment Patient Details Name: [name] MRN: [id number] DOB: 05/12/1926 Today's Date: 07/07/2020    History of Present Illness 86 yo female admitted with UTI SIRS. S/P cystoscopy, L double J stent placement 06/11/20. S/P R colectomy 06/22/20. Hx of Afib, CKD, anemia, CHF, NSVT, pacemaker, chronic back pain, spinal stenosis, orthostatic hypotension. Wound vac placed 8/17    PT Comments    Pt tolerates therapeutic exercises, cued for ROM and motor control to decrease momentum. Pt requires increased time and cues with bed mobility. Pt tolerates standing marching and short steps in room forward/backwards and sidestepping with RW, but fatigues and requires cues for pursed lip breathing and seated rest breaks to recover. Pt on RA with SpO2 97-100%. Pt tolerates remaining up in chair at EOS with all needs in reach. Patient will benefit from continued physical therapy in hospital and recommendations below to increase strength, balance, endurance for safe ADLs and gait.   Follow Up Recommendations  SNF     Equipment Recommendations  None recommended by PT    Recommendations for Other Services       Precautions / Restrictions Precautions Precautions: Fall Precaution Comments: loose stools, wound vac Restrictions Weight Bearing Restrictions: No    Mobility  Bed Mobility Overal bed mobility: Needs Assistance Bed Mobility: Supine to Sit   Sidelying to sit: Mod assist;HOB elevated  General bed mobility comments: slow to respond to verbal cues, increased time to come to sitting EOB, mod A to bring BLE over to bedside and upright trunk with HOB elevated and pt cued to use bedrail in assisting trunk up and to scoot to EOB  Transfers Overall transfer level: Needs assistance Equipment used: Rolling walker (2 wheeled) Transfers: Sit to/from [facility] Sit to Stand: Min guard Stand pivot transfers: Min guard  General transfer comment: cues for hand  placement and line management for safety, increased time  Ambulation/Gait Ambulation/Gait assistance: Min assist Gait Distance (Feet): 5 Feet Assistive device: Rolling walker (2 wheeled) Gait Pattern/deviations: Step-to pattern;Decreased stride length Gait velocity: decreased   General Gait Details: limited to forward/backward and sidesteps at bedside, fatigues with mobility with audible wheezing, cues for maintaining body within RW frame for safety   Stairs             Wheelchair Mobility    Modified Rankin (Stroke Patients Only)       Balance Overall balance assessment: Needs assistance Sitting-balance support: Feet supported Sitting balance-Leahy Scale: Fair Sitting balance - Comments: seated EOB   Standing balance support: During functional activity;Bilateral upper extremity supported Standing balance-Leahy Scale: Poor Standing balance comment: reliant on RW       Cognition Arousal/Alertness: Awake/alert Behavior During Therapy: WFL for tasks assessed/performed Overall Cognitive Status: History of cognitive impairments - at baseline           Exercises General Exercises - Lower Extremity Long Arc Quad: AROM;Both;15 reps;Seated Hip Flexion/Marching: AROM;Both;15 reps;Standing    General Comments General comments (skin integrity, edema, etc.): cues for pursed lip breathing during activity, on RA with SpO2 97-100%      Pertinent Vitals/Pain Pain Assessment: No/denies pain    Home Living                      Prior Function            PT Goals (current goals can now be found in the care plan section) Acute Rehab PT Goals Patient Stated Goal: agreeable to stand PT Goal  Formulation: With patient Time For Goal Achievement: 07/12/20 Potential to Achieve Goals: Fair Progress towards PT goals: Progressing toward goals    Frequency    Min 2X/week      PT Plan Current plan remains appropriate    Co-evaluation               AM-PAC PT "6 Clicks" Mobility   Outcome Measure  Help needed turning from your back to your side while in a flat bed without using bedrails?: A Lot Help needed moving from lying on your back to sitting on the side of a flat bed without using bedrails?: A Lot Help needed moving to and from a bed to a chair (including a wheelchair)?: A Little Help needed standing up from a chair using your arms (e.g., wheelchair or bedside chair)?: A Little Help needed to walk in hospital room?: A Little Help needed climbing 3-5 steps with a railing? : Total 6 Click Score: 14    End of Session Equipment Utilized During Treatment: Gait belt Activity Tolerance: Patient limited by fatigue Patient left: in chair;with call bell/phone within reach;with chair alarm set Nurse Communication: Mobility status PT Visit Diagnosis: Difficulty in walking, not elsewhere classified (R26.2);Muscle weakness (generalized) (M62.81)     Time: 4314-2767 PT Time Calculation (min) (ACUTE ONLY): 23 min  Charges:  $Gait Training: 8-22 mins $Therapeutic Exercise: 8-22 mins                      Tori Juliette Standre PT, DPT 07/07/20, 11:36 AM

## 2020-07-08 DIAGNOSIS — I5043 Acute on chronic combined systolic (congestive) and diastolic (congestive) heart failure: Secondary | ICD-10-CM

## 2020-07-08 LAB — RENAL FUNCTION PANEL
Albumin: 2.4 g/dL — ABNORMAL LOW (ref 3.5–5.0)
Anion gap: 8 (ref 5–15)
BUN: 33 mg/dL — ABNORMAL HIGH (ref 8–23)
CO2: 20 mmol/L — ABNORMAL LOW (ref 22–32)
Calcium: 7.3 mg/dL — ABNORMAL LOW (ref 8.9–10.3)
Chloride: 117 mmol/L — ABNORMAL HIGH (ref 98–111)
Creatinine, Ser: 1.61 mg/dL — ABNORMAL HIGH (ref 0.44–1.00)
GFR calc Af Amer: 32 mL/min — ABNORMAL LOW (ref >60)
GFR calc non Af Amer: 28 mL/min — ABNORMAL LOW (ref >60)
Glucose, Bld: 85 mg/dL (ref 70–99)
Phosphorus: 3.1 mg/dL (ref 2.5–4.6)
Potassium: 3.8 mmol/L (ref 3.5–5.1)
Sodium: 145 mmol/L (ref 135–145)

## 2020-07-08 LAB — HEMOGLOBIN AND HEMATOCRIT, BLOOD
HCT: 25.5 % — ABNORMAL LOW (ref 36.0–46.0)
Hemoglobin: 7.3 g/dL — ABNORMAL LOW (ref 12.0–15.0)

## 2020-07-08 LAB — AMMONIA: Ammonia: 35 umol/L (ref 9–35)

## 2020-07-08 LAB — VITAMIN B12: Vitamin B-12: 362 pg/mL (ref 180–914)

## 2020-07-08 LAB — TSH: TSH: 7.164 u[IU]/mL — ABNORMAL HIGH (ref 0.350–4.500)

## 2020-07-08 LAB — T4, FREE: Free T4: 0.84 ng/dL (ref 0.61–1.12)

## 2020-07-08 LAB — FOLATE: Folate: 8.6 ng/mL (ref >5.9)

## 2020-07-08 MED ORDER — FUROSEMIDE 10 MG/ML IJ SOLN: 20.0000 mg | Freq: Two times a day (BID) | INTRAMUSCULAR | Status: DC

## 2020-07-08 MED ORDER — FUROSEMIDE 10 MG/ML IJ SOLN: 20.0000 mg | Freq: Once | INTRAMUSCULAR | Status: DC

## 2020-07-08 MED ORDER — TORSEMIDE 20 MG PO TABS: 20.0000 mg | ORAL_TABLET | Freq: Every day | ORAL | Status: DC

## 2020-07-08 NOTE — Consult Note (Signed)
Cardiology Progress Note   Patient ID: [name] MRN: [id number]; DOB: 02-Dec-1925  Admit date: 06/11/2020 Date of Consult: 07/08/2020  Primary Care Provider: [name], MD [facility] Cardiologist: [name], MD  [facility] Electrophysiologist:  None    Patient Profile:   [name] is a 86 y.o. female with a hx of chronic combined systolic/diastolic heart failure, tachybrady syndrome status post BiV PPM, severe MR status post MV repair in 2003, CKD stage III, chronic atrial fibrillation not on anticoagulation due to GI bleed, orthostatic hypotension, moderate pulmonary hypertension, severe TR who is being seen today for the evaluation of heart failure at the request of Dr. Grandville Silos.  History of Present Illness:   [name] was admitted on 06/11/2020 with GI bleed and UTI.  She was found to have cecal mass on CT, along with left-sided hydronephrosis and intrarenal calculi bilaterally.  She underwent ureteral stent placement by urology on 06/11/2020.  She was treated with abx for UTI.  She underwent colonoscopy on 06/18/2020 which showed large cecal mass, with pathology consistent with adenocarcinoma.  She was seen by cardiology for preop evaluation.  She underwent surgery on 06/21/2020, with pathology consistent with adenocarcinoma.  Her hospital course has been complicated by ileus, acute on chronic heart failure, NSVT, AKI on CKD.  She currently denies any dyspnea.    Past Medical History:  Diagnosis Date  . Acute lower UTI 11/27/2013  . Acute on chronic renal insufficiency   . Anemia   . Brady-tachy syndrome ([place])    a. 2003: post-op afib after MR repair then symptomatic bradycardia requiring pacemaker. b. Upgrade to Medtronic Bi-V Pacemaker 2007.  . Bronchiectasis with acute exacerbation ([place])   . Cancer of cecum ([place])   . Cellulitis and abscess of foot 08/15/2015   RT FOOT  . Chronic atrial fibrillation ([place])    a. Previously failed DCCV/amio/tikosyn.   CHADS2VASC score 5 - not on anticoagulation due to history of GI bleeding.    . Chronic combined systolic and diastolic CHF (congestive heart failure) (HCC)    a. EF 35-40% on echo 09/2013 b. echo 04/2016: EF 50-55% with mild to moderate MS.  [name] Chronic diastolic CHF (congestive heart failure) ([place])    EF 35-40% on echo 09/2013 but now 50-55% by echo 04/2016  . Cirrhosis ([place])    a. Newly recognized 09/2013 - by CT.  . CKD (chronic kidney disease)   . Emesis   . Gastritis with hemorrhage   . GI bleeding   . Gout 11/07/2013  . History of gout 08/15/2015  . Hydronephrosis of left kidney 06/12/2020  . Hypertension   . Iron deficiency anemia due to chronic blood loss 06/21/2020  . Left ureteral stone 06/12/2020  . Liver cirrhosis ([place]) 10/10/2013  . Mitral valve disorder    a. Severe MR s/p repair 2003 (28 mm annuloplasty ring and and oversew of LAA). No CAD by cath at that time.  [name] NSVT (nonsustained ventricular tachycardia) ([place])    a. Isolated event during 09/2007 adm.  . NSVT (nonsustained ventricular tachycardia) ([place])   . Orthostatic hypotension 10/31/2013  . Orthostatic hypotension dysautonomic syndrome ([place])   . Permanent atrial fibrillation ([place]) 10/28/2013  . Pulmonary hypertension (HCC)    Group II secondary to CHF and MV disease  . Respiratory distress 10/06/2017  . Sepsis secondary to UTI ([place]) 06/12/2020  . Spinal stenosis    shes recieved epidural steroid injections in the past  . Syncope and collapse 04/04/2020  .  Tricuspid regurgitation     Past Surgical History:  Procedure Laterality Date  . APPENDECTOMY    . BALLOON DILATION N/A 06/05/2020   Procedure: BALLOON DILATION;  Surgeon: Otis Brace, MD;  Location: [facility];  Service: Gastroenterology;  Laterality: N/A;  . BIOPSY  06/05/2020   Procedure: BIOPSY;  Surgeon: Otis Brace, MD;  Location: [facility];  Service: Gastroenterology;;  . BIOPSY  06/18/2020   Procedure: BIOPSY;  Surgeon: Wilford Corner,  MD;  Location: [facility];  Service: Endoscopy;;  . BIV PACEMAKER GENERATOR CHANGE OUT N/A 12/13/2014   Procedure: BIV PACEMAKER GENERATOR CHANGE OUT;  Surgeon: Evans Lance, MD;  Location: [facility] CATH LAB;  Service: Cardiovascular;  Laterality: N/A;  . CHOLECYSTECTOMY    . COLONOSCOPY N/A 06/18/2020   Procedure: COLONOSCOPY;  Surgeon: Wilford Corner, MD;  Location: [facility];  Service: Endoscopy;  Laterality: N/A;  Either to be done by Dr. Michail Sermon or Dr. Paulita Fujita  . CYSTOSCOPY WITH STENT PLACEMENT Left 06/11/2020   Procedure: CYSTOSCOPY WITH STENT PLACEMENT;  Surgeon: Irine Seal, MD;  Location: [facility];  Service: Urology;  Laterality: Left;  . ESOPHAGOGASTRODUODENOSCOPY (EGD) WITH PROPOFOL N/A 06/05/2020   Procedure: ESOPHAGOGASTRODUODENOSCOPY (EGD) WITH PROPOFOL;  Surgeon: Otis Brace, MD;  Location: [facility];  Service: Gastroenterology;  Laterality: N/A;  . IRRIGATION AND DEBRIDEMENT ABSCESS N/A 10/14/2013   Procedure: IRRIGATION AND DEBRIDEMENT PERINEAL ABSCESS;  Surgeon: Rolm Bookbinder, MD;  Location: [place];  Service: General;  Laterality: N/A;  . LAPAROSCOPIC RIGHT COLECTOMY Right 06/22/2020   Procedure: LAPAROSCOPIC RIGHT COLECTOMY;  Surgeon: Jovita Kussmaul, MD;  Location: [facility];  Service: General;  Laterality: Right;  . SUBMUCOSAL TATTOO INJECTION  06/18/2020   Procedure: SUBMUCOSAL TATTOO INJECTION;  Surgeon: Wilford Corner, MD;  Location: [facility];  Service: Endoscopy;;  . VALVE REPLACEMENT     sever mitral regurgitation s/p mitral valve annuloplasty ring       Inpatient Medications: Scheduled Meds: . (feeding supplement) PROSource Plus  30 mL Oral BID BM  . acetaminophen  1,000 mg Oral Q6H  . calcium-vitamin D  1 tablet Oral TID  . Chlorhexidine Gluconate Cloth  6 each Topical Daily  . feeding supplement  1 Container Oral TID BM  . midodrine  5 mg Oral TID WC  . multivitamin with minerals  1 tablet Oral Daily  . pantoprazole  40 mg Oral Daily  . sodium  bicarbonate  650 mg Oral BID  . sodium chloride flush  3 mL Intravenous Q12H  . torsemide  20 mg Oral Daily   Continuous Infusions:  PRN Meds: HYDROmorphone (DILAUDID) injection, levalbuterol, metoprolol tartrate, ondansetron (ZOFRAN) IV, oxyCODONE, sodium chloride flush  Allergies:   No Known Allergies  Social History:   Social History   Socioeconomic History  . Marital status: Single    Spouse name: Not on file  . Number of children: Not on file  . Years of education: Not on file  . Highest education level: Not on file  Occupational History  . Not on file  Tobacco Use  . Smoking status: Former Smoker    Quit date: 10/11/1971    Years since quitting: 48.7  . Smokeless tobacco: Never Used  Substance and Sexual Activity  . Alcohol use: Yes    Comment: rare  . Drug use: No  . Sexual activity: Not on file  Other Topics Concern  . Not on file  Social History Narrative  . Not on file   Social Determinants of Health   Financial  Resource Strain:   . Difficulty of Paying Living Expenses: Not on file  Food Insecurity:   . Worried About Charity fundraiser in the Last Year: Not on file  . Ran Out of Food in the Last Year: Not on file  Transportation Needs:   . Lack of Transportation (Medical): Not on file  . Lack of Transportation (Non-Medical): Not on file  Physical Activity:   . Days of Exercise per Week: Not on file  . Minutes of Exercise per Session: Not on file  Stress:   . Feeling of Stress : Not on file  Social Connections:   . Frequency of Communication with Friends and Family: Not on file  . Frequency of Social Gatherings with Friends and Family: Not on file  . Attends Religious Services: Not on file  . Active Member of Clubs or Organizations: Not on file  . Attends Archivist Meetings: Not on file  . Marital Status: Not on file  Intimate Partner Violence:   . Fear of Current or Ex-Partner: Not on file  . Emotionally Abused: Not on file  .  Physically Abused: Not on file  . Sexually Abused: Not on file    Family History:    Family History  Problem Relation Age of Onset  . Pancreatic cancer Father   . Other Mother        Mother died at 26 with no real medical problems     ROS:  Please see the history of present illness.   All other ROS reviewed and negative.     Physical Exam/Data:   Vitals:   07/08/20 0554 07/08/20 0822 07/08/20 0923 07/08/20 1232  BP:   (!) 102/55 (!) 92/42  Pulse:  69 71 69  Resp:    18  Temp:    97.6 F (36.4 C)  TempSrc:    Oral  SpO2:  96%  99%  Weight: 94.4 kg     Height:        Intake/Output Summary (Last 24 hours) at 07/08/2020 1654 Last data filed at 07/08/2020 1608 Gross per 24 hour  Intake 280 ml  Output 500 ml  Net -220 ml   Last 3 Weights 07/08/2020 07/07/2020 07/06/2020  Weight (lbs) 208 lb 1.8 oz 206 lb 5.6 oz 207 lb 10.8 oz  Weight (kg) 94.4 kg 93.6 kg 94.2 kg     Body mass index is 38.06 kg/m.  General: appears chronically ill, in no acute distress HEENT: normal Lymph: no adenopathy Neck: cannot assess JVD, has RIJ CVC in place Cardiac:  normal S1, S2; RRR; no murmur Lungs:  Expiratory wheezing Abd: soft, nontender Ext: 1+ edema Musculoskeletal:  No deformities, BUE and BLE strength normal and equal Skin: warm and dry  Neuro:   no focal abnormalities noted Psych:  Normal affect   EKG:  The EKG was personally reviewed and demonstrates:  EKG 7/28 V-paced, rated 70, PVCs Telemetry:  Telemetry was personally reviewed and demonstrates: V paced, rate 70, several runs of NSVT up to 16 beats  Relevant CV Studies: Echo 04/05/20: 1. Left ventricular ejection fraction, by estimation, is 40%. The left  ventricle has mildly decreased function. The left ventricle demonstrates  global hypokinesis. The left ventricular internal cavity size was mildly  dilated. Left ventricular diastolic  parameters are indeterminate.  2. Right ventricular systolic function is moderately  reduced. The right  ventricular size is moderately enlarged. The estimated right ventricular  systolic pressure is 76.7 mmHg.  3. Left  atrial size was severely dilated.  4. Right atrial size was severely dilated.  5. The mitral valve has been repaired/replaced. Mild mitral valve  regurgitation. No evidence of mitral stenosis however Doppler alignment of  diastolic gradient is slightly suboptimal. The mean mitral valve gradient  is 2.0 mmHg with average heart rate of  70 bpm.  6. Tricuspid valve regurgitation is severe.  7. The aortic valve is tricuspid. Aortic valve regurgitation is mild. No  aortic stenosis is present.  8. The inferior vena cava is dilated in size with <50% respiratory  variability, suggesting right atrial pressure of 15 mmHg.   Laboratory Data:  High Sensitivity Troponin:  No results for input(s): TROPONINIHS in the last 720 hours.   Chemistry Recent Labs  Lab 07/07/20 0400 07/07/20 1451 07/08/20 0533  NA 149* 145 145  K 3.8 4.0 3.8  CL 121* 119* 117*  CO2 20* 20* 20*  GLUCOSE 102* 115* 85  BUN 29* 32* 33*  CREATININE 1.66* 1.70* 1.61*  CALCIUM 7.2* 7.1* 7.3*  GFRNONAA 27* 26* 28*  GFRAA 31* 30* 32*  ANIONGAP 8 6 8     Recent Labs  Lab 07/04/20 0310 07/04/20 0310 07/05/20 0415 07/05/20 0415 07/06/20 0500 07/07/20 1451 07/08/20 0533  PROT 4.5*  --  4.5*  --  4.6*  --   --   ALBUMIN 1.9*   < > 1.9*   < > 2.0* 2.2* 2.4*  AST 14*  --  15  --  16  --   --   ALT 9  --  9  --  10  --   --   ALKPHOS 91  --  92  --  97  --   --   BILITOT 0.5  --  0.6  --  0.6  --   --    < > = values in this interval not displayed.   Hematology Recent Labs  Lab 07/04/20 0310 07/04/20 0310 07/05/20 0415 07/05/20 0415 07/06/20 0500 07/07/20 0400 07/08/20 0531  WBC 6.8  --  7.0  --  7.0  --   --   RBC 2.83*  --  2.81*  --  2.87*  --   --   HGB 7.4*   < > 7.3*   < > 7.5* 7.4* 7.3*  HCT 26.2*   < > 26.0*   < > 26.5* 26.1* 25.5*  MCV 92.6  --  92.5  --   92.3  --   --   MCH 26.1  --  26.0  --  26.1  --   --   MCHC 28.2*  --  28.1*  --  28.3*  --   --   RDW 21.9*  --  22.5*  --  22.8*  --   --   PLT 154  --  146*  --  162  --   --    < > = values in this interval not displayed.   BNPNo results for input(s): BNP, PROBNP in the last 168 hours.  DDimer No results for input(s): DDIMER in the last 168 hours.   Radiology/Studies:  DG CHEST PORT 1 VIEW  Result Date: 07/05/2020 CLINICAL DATA:  Wheezing. EXAM: PORTABLE CHEST 1 VIEW COMPARISON:  06/22/2020. FINDINGS: Right IJ line in stable position. AICD in stable position. Prior cardiac valve replacement. Cardiomegaly. No pulmonary venous congestion. Persistent unchanged left pleural effusion. Underlying atelectasis and or infiltrate cannot be excluded. Mild right infrahilar infiltrate cannot be excluded. No pneumothorax. IMPRESSION: 1.  Right IJ line in stable position. 2. AICD in stable position. Cardiomegaly. No pulmonary venous congestion. 3. Persistent unchanged left pleural effusion. Underlying atelectasis and or infiltrate cannot be excluded. Mild right infrahilar infiltrate cannot be excluded. Electronically Signed   By: Marcello Moores  Register   On: 07/05/2020 13:06    Assessment and Plan:   Acute on chronic combined systolic and diastolic heart failure: History recent echo 04/05/2020 showed LVEF 40%, global hypokinesis, moderate RV dysfunction, mild mitral regurgitation, severe tricuspid regurgitation.  Net +8.6 L on admission, though unclear if I/O's are accurate.  Weight has increased from 194 pounds on admission on 7/26 to 208 pounds today.  She has been on IV lasix 20 mg BID for diuresis for last few days but developed hypernatremia and now transitioned to home torsemide 20 mg daily. - Check BNP - Check echo - Continue torsemide   Permanent atrial fibrillation: Not on anticoagulation due to GI bleed history.  Rates have been well controlled on Toprol-XL 12.5 mg daily, but discontinued today per  family request as they were concerned about chronic hypotension requiring midodrine.   -Will monitor rates off metoprolol  Tachybrady syndrome: s/p BiV PPM  NSVT: maintain K>4, Mag>2  AKI on CKD stage 3b: Creatinine peaked at 3 has trended down, currently 1.6  Cecal adenocarcinoma: Status post right colectomy 06/22/2020.  Oncology, general surgery, and palliative care following.  Postop course complicated by ileus, which has resolved.    Sepsis: Resolved, due to UTI.  Completed course of Augmentin.  Obstructive uropathy: Status post ureteral stent 06/11/2020.  Stent removal planned 07/10/2020  Hypotension: Chronic issue, on midodrine  For questions or updates, please contact [place] Please consult [email or web address] for contact info under    Signed, Donato Heinz, MD  07/08/2020 4:54 PM

## 2020-07-08 NOTE — Progress Notes (Signed)
PROGRESS NOTE    [name]  BTD:[id number] DOB: 12-Aug-1926 DOA: 06/11/2020 PCP: [name], MD   No chief complaint on file.   Brief Narrative:  86 year old lady with prior history of tachybradycardia syndrome s/p biventricular pacemaker placement in 2007, severe MR s/p repair, chronic atrial fibrillation not on anticoagulation secondary to GI bleed, abnormal esophageal motility consistent with presbyesophagus, gastritis on last endoscopy with balloon dilatation by [name], pulmonary hypertension presents with 2 days of dysuria, and abdominal pain, black stools for several weeks, and increased confusion admitted for sepsis secondary to urinary tract infection in the setting of obstructive uropathy and acute on chronic anemia.  CT of the abdomen and pelvis shows left-sided moderate hydronephrosis with intrarenal calculi bilaterally, left ureteral calculi and soft tissue fullness in the cecal area.  Patient underwent ureteral stent placement by urology on 06/11/2020.  And was started on IV Rocephin and admitted to [facility] service for further evaluation and management. Urine culture showed lactobacillus transition IV Rocephin to oral amoxicillin for another 2 weeks to complete the course. [name] with Urology suggested he will post pone the stent removal to a later date. She underwent CT abd showing cecal mass concerning for malignancy. She underwent colonoscopy on 06/18/20 by [name] showing large cecal mass concerning for malignancy. Gen surgery consulted for further management. Cecal mass pathology shows adenocarcinoma. Cardiology consulted for pre op clearance as she might need surgical intervention.  Oncology consulted who recommended CT chest for further staging.  Patient underwent surgery 06/21/2020 with pathology consistent with adenocarcinoma involving the cecum invading into subserosal area without lymphovascular or perineural invasion, 20 lymph nodes were negative for CA-CEA was 575  on 06/19/2020.  Hospital course complicated by probable ileus as well as multiple dark watery stools, patient reassessed by surgery and Reglan added.  Patient diet advanced to a solid diet since 06/30/2020 which has been tolerating.  Patient noted to have some oozing had a subcutaneous hematoma removed 07/02/2020 and wound VAC placed over base of incision. Patient also noted to have some runs of nonsustained V. tach..   Assessment & Plan:   Principal Problem:   Acute lower UTI Active Problems:   Liver cirrhosis (HCC)   CKD (chronic kidney disease) stage 3, GFR 30-59 ml/min   Anemia   Biventricular cardiac pacemaker in situ   Permanent atrial fibrillation (HCC)   Chronic diastolic CHF (congestive heart failure) (HCC)   Gout   S/P MVR (mitral valve repair)   AKI (acute kidney injury) ([place])   Orthostatic hypotension dysautonomic syndrome (HCC)   GI bleeding   Obstructive uropathy   Sepsis secondary to UTI ([place])   Hydronephrosis of left kidney   Left ureteral stone   Advanced care planning/counseling discussion   Goals of care, counseling/discussion   Palliative care by specialist   DNR (do not resuscitate)   Acute on chronic renal insufficiency   Iron deficiency anemia due to chronic blood loss   Cancer of cecum (HCC)   Emesis   Gastritis with hemorrhage   Acute on chronic diastolic CHF (congestive heart failure) ([place])   Ileus (HCC)  1 SIRS/Sepsis secondary to UTI Patient on admission met criteria for systemic inflammatory response with, MAP < 65, AKI, urinalysis worrisome for UTI.  Urine cultures positive for lactobacillus.  Patient noted to have left-sided moderate hydronephrosis secondary to left ureteral stone and underwent left ureteral stent placement 06/11/2020.  Patient was on IV Rocephin which has subsequently been discontinued.  Amoxicillin started 06/20/2020  to complete a 14-day course of antibiotic treatment as patient with stent placement and left urethral stone.  Patient  due to emesis and concern for possible aspiration pneumonia amoxicillin discontinued and patient placed on IV Zosyn.  Patient improved clinically was transitioned to Augmentin and completed a full course of antibiotic treatment.  No further antibiotics needed.  Follow.    2.  Obstructive uropathy with left-sided moderate hydronephrosis secondary to left ureteral stone Noted on CT abdomen and pelvis.  Patient seen in consultation by urology and underwent left ureteral stent placement 06/11/2020.  Foley catheter initially discontinued and placed back in perioperatively.  Foley catheter has been discontinued. Urine cultures positive for lactobacillus.  Patient on IV Rocephin which was subsequently discontinued.  Amoxicillin started 06/20/2020 to complete a 10 - 14-day course of antibiotic treatment.  Patient with good urine output.  Clinical improvement.  Per urology reschedule uteroscopy for about 2 to 3 weeks out to give patient opportunity to recover from pending colon resection.  Patient with bout of nausea and emesis 06/24/2020, concern for aspiration pneumonia and as such antibiotic were changed to IV Zosyn.  IV Zosyn was subsequently transitioned to Augmentin and patient completed full course of antibiotic treatment.  No further antibiotics needed.  Patient for stent removal on Tuesday, 07/10/2020 which likely will be done inpatient as patient still requiring medical management for volume overload/hyponatremia.  Per urology.    3.  Cecal adenocarcinoma Soft tissue fullness noted in cecal area on CT abdomen and pelvis.  Repeat CT abdomen and pelvis showing a lobulated mass in the cecum concerning for malignancy.  GI consulted and patient underwent colonoscopy with biopsy on 06/18/2020 per Dr. Michail Sermon with pathology positive for adenocarcinoma.  Patient seen in consultation by general surgery to see whether she is a candidate for diverting colostomy.  Patient seen in consultation by cardiology for preop clearance.   Patient currently not obstructed at this time.  CT chest with interval development of 10 mm subpleural rounded density seen posteriorly in the right lung apex.  Follow-up unenhanced chest CT in 3 months recommended to ensure stability or resolution.  Patient s/p laparoscopic right colectomy 06/22/2020.  Oncology consulted and are following and will evaluate postop to determine if further adjuvant treatment is feasible or warranted.  Palliative care also following.  Patient was tolerating a clear liquid diet and diet advanced to a soft diet.  Patient noted to have nausea and emesis the afternoon of 06/24/2020 and placed on a clear liquid diet.  Abdominal films obtained concerning for ileus versus SBO.  Ileus resolved.  Currently tolerating a soft diet.  Will likely follow-up with oncology in the outpatient setting.  Appreciate general surgery, oncology, palliative care input and recommendations.  4.  Fascial deficit status post hematoma removal from base of wound 07/02/2020 Patient being followed by wound care nurse as well as general surgery.  Wound VAC placed.  Per general surgery.  5.  Nausea and emesis/?  Ileus versus SBO Patient noted to have nausea and emesis the afternoon of 06/24/2020 after being started on a soft diet.  Abdominal films obtained concerning for ileus versus SBO bowel gas pattern.  Patient was started on Reglan.  Patient having bowel movements.  Ileus resolved.  Tolerating diet.  Per general surgery.  Supportive care.   6.??  Aspiration pneumonia Patient with bout of nausea and emesis on 06/24/2020.  Acute abdominal series done with left pleural effusion with left lower lobe atelectasis or infiltrate, cardiomegaly, increasing right  basilar atelectasis or infiltrate.  Amoxicillin was discontinued patient was placed on IV Zosyn.  Patient subsequently transitioned from IV Zosyn to Augmentin and has completed a full course of antibiotic treatment.  No further antibiotics needed.   7.  Probable  acute on chronic  combined systolic diastolic heart failure Patient with expiratory wheezing( which has since improved with diuresis), 1-2+ bilateral lower extremity edema over the past few days which has improved with diuresis..  Patient is +10 L during this hospitalization however unsure of accuracy of I's and O's.  Patient noted to have a weight of 94.4 kg from 93.6 kg from 94.2 kg from 94 kg from 93.6 kg (07/03/2020) from 87.1 kg (07/02/2020).  2D echo from May 2021 with a EF of 40%.  Patient denies any significant shortness of breath. Patient received a dose of IV Lasix on 06/21/2020.  Patient on Lasix 20 mg daily however was on Demadex prior to admission.  Patient placed on Lasix 20 mg IV every 12 hours for the past 2 to 3 days.  Discontinued IV Lasix.  Status post IV albumin x1.  Home dose Demadex resumed.  Strict I's and O's.  Daily weights.  Family requesting cardiology consultation to manage patient's cardiac issues while hospitalized.  Will consult with cardiology per family request.   8.  Chronic hypotension Continue midodrine.   9.  Permanent atrial fibrillation Currently rate controlled on beta-blocker..  Patient not a anticoagulation candidate secondary to history of GI bleed.  Cardiology was following however have signed off.  Patient's daughter concerned about patient receiving beta-blocker in the setting of her chronic hypotension (on midodrine) and requesting beta-blocker be discontinued with consultation of cardiology to manage patient's cardiac issues.  Consult cardiology per family request.  10.  Tachybradycardia syndrome with BiV PPM/nonsustained V. tach Stable.  Last interrogation April 2021 patient noted to be V pacing.  Patient noted to have a bout of nonsustained V. tach the morning of 07/05/2020 per RN.  Potassium currently at 3.8.  Keep potassium > 4.  Keep magnesium >2.  Patient on beta-blocker.  Patient with chronic hypotension on midodrine.  Daughter concerned about patient  receiving beta-blocker in the setting of her chronic hypotension and requesting evaluation by cardiology to help manage patient's chronic cardiac issues, and discontinuation of beta-blocker.  Beta-blocker has been discontinued.  Consult cardiology at family's request.    11.  Acute on chronic kidney disease stage IIIb Likely post renal azotemia in the setting of obstructive uropathy in the setting of sepsis and relative hypotension on admission.  Creatinine noted to have peaked at 3 and trended down.  Creatinine currently at 1.61.  Urine output not properly recorded over the past 24 hours.  Patient received a dose of IV Lasix on 06/21/2020.  Demadex was resumed however held due to nausea and vomiting.  Creatinine trending down with diuresis.  Patient was on oral Lasix 20 mg daily, however patient with some expiratory wheezing and some lower extremity edema due to concerns for volume overload oral Lasix has been changed to IV Lasix.  Patient noted to have been +10 L during this hospitalization however doubt accuracy of I's and O's.  Patient given a dose of Lasix 20 mg IV x1 on 07/04/2020.  Patient supposedly is +10.148 L during this hospitalization however unsure about accuracy of I's and O's.  Patient with a bump in sodium levels.  DC IV Lasix.  Home regimen oral Demadex resumed.  Current weight of of 94.4 kg from  93.6 kg from 94.2 kg from 94 kg from 93.6 kg (07/03/2020) from 87.1 kg (07/02/2020). Follow.   12.  Hypernatremia Likely secondary to free water deficit secondary to poor oral intake.  Slight bump in sodium level.  Diuretics initially held and subsequently patient restarted on low-dose Lasix 20 mg daily.  Concern for volume overload and as such patient started on IV Lasix.  Sodium increased as high as 149.  IV Lasix discontinued.  Patient hydrated gently with D5W at 50 cc an hour for 12 hours on 07/07/2020.  Sodium level at 145 this morning.  Patient back on oral home dose Demadex.  Follow.    13.  Iron  deficiency anemia secondary to colonic cancer bleed/anemia of chronic disease Status post recent EGD showing gastritis and presbyesophagus.  Baseline hemoglobin approximately 9.  On admission hemoglobin noted to be 7.9.  Status post 1 unit transfusion packed red blood cells hemoglobin currently at 7.3 and currently stable.  Status post IV Feraheme 510 mg x 1( 06/24/2020) and 06/30/2020.  Transfusion threshold hemoglobin < 7.  14.  Vitamin D deficiency Continue Os-Cal with vitamin D 3 times daily.  Outpatient follow-up.  15.??  Confusion versus sundowning Patient with some bouts of confusion usually starting in the evening after 4 PM.  Likely sundowning versus secondary to long hospital stay.  TSH slightly elevated.  Check a free T4.  Ammonia level at 35.  Vitamin B12 at 362.  Folate at 8.6.  Patient with no focal neurological deficits.  Daughter requesting cardiology consultation as he feels beta-blocker in the setting of patient's chronic hypotension may be leading to some confusion and requesting beta-blocker be discontinued.  Beta-blocker discontinued.     DVT prophylaxis: SCDs.  History of GI bleed. Code Status: full Family Communication: Updated daughter at bedside.  Disposition:   Status is: Inpatient    Dispo: The patient is from: Assisted living facility              Anticipated d/c is to: SNF              Anticipated d/c date is: 07/11/2020              Patient currently worked up for cecal cancer and s/p laparoscopic right colectomy 06/22/2020.  Patient with ileus which has since resolved.  Patient with expiratory wheezing concern for volume overload which is improving.  Patient being diuresed and IV Lasix discontinued.  General surgery following.  Oncology was following.  Patient not stable for discharge.        Consultants:   General surgery: Dr.Toth III 06/19/2020  Cardiology: Dr. Radford Pax 06/19/2020  Gastroenterology: Dr. Watt Climes 06/11/2020  Urology: Dr. Jeffie Pollock  06/11/2020  Oncology Dr. Lorenso Courier 06/20/2020  Palliative care: Dr. Rowe Pavy 06/20/2020  Procedures:  CT chest 06/28/2020  CT abdomen and pelvis 06/15/2020, 06/11/2020, 07/02/2020  Chest x-ray 06/11/2020  Colonoscopy 06/18/2020--per Dr. Michail Sermon gastroenterology  Cystoscopy with left retrograde pyelogram and interpretation/cystoscopy with insertion of left double-J stent per Dr.Wrenn 06/11/2020  Transfusion 1 unit packed red blood cells 06/11/2020  Laparoscopic right colectomy by Dr. Marlou Starks III 06/22/2020  Evacuation of subcutaneous hematoma per general surgery 07/02/2020  Antimicrobials:  IV Rocephin 06/11/2020>>>> 06/14/2020  Amoxicillin 06/20/2020>>>>> 06/24/2020  IV Zosyn 06/24/2020>>>>> 06/28/2020  Augmentin 06/28/2020>>>> 07/03/2020   Subjective: Sitting up in chair.  Denies any chest pain or shortness of breath.  Tolerating current diet.  Daughter requesting patient be evaluated by cardiology to help manage chronic cardiac issues.  Daughter concerned that  beta-blocker with patient's chronic hypotension may be contributing to confusion and requesting to be discontinued.  Daughter asking for cardiology consultation.   Objective: Vitals:   07/08/20 0519 07/08/20 0554 07/08/20 0822 07/08/20 0923  BP: 93/74   (!) 102/55  Pulse: 70  69 71  Resp: 16     Temp: 97.6 F (36.4 C)     TempSrc: Oral     SpO2: 93%  96%   Weight:  94.4 kg    Height:        Intake/Output Summary (Last 24 hours) at 07/08/2020 1042 Last data filed at 07/08/2020 0600 Gross per 24 hour  Intake 751.44 ml  Output 250 ml  Net 501.44 ml   Filed Weights   07/06/20 0538 07/07/20 0500 07/08/20 0554  Weight: 94.2 kg 93.6 kg 94.4 kg    Examination:  General exam: NAD. Respiratory system: Some bibasilar crackles.  No wheezing.  Fair air movement.  No use of accessory muscles of respiration.  Speaking in full sentences.   Cardiovascular system: RRR no murmurs rubs or gallops.  No JVD.  Trace bilateral lower extremity edema.   1+ bilateral upper extremity edema.  Gastrointestinal system: Abdomen is obese, soft, nontender, nondistended, positive bowel sounds.  No rebound.  No guarding.  Wound VAC over mid abdominal region.   Central nervous system: Alert.  Moving extremities spontaneously.  No focal neurological deficits.  Extremities: Symmetric 5 x 5 power. Skin: No rashes, lesions or ulcers Psychiatry: Judgement and insight appear fair. Mood & affect appropriate.     Data Reviewed: I have personally reviewed following labs and imaging studies  CBC: Recent Labs  Lab 07/02/20 0409 07/02/20 0409 07/04/20 0310 07/05/20 0415 07/06/20 0500 07/07/20 0400 07/08/20 0531  WBC 6.9  --  6.8 7.0 7.0  --   --   NEUTROABS 4.3  --  4.1 4.2 4.6  --   --   HGB 7.6*   < > 7.4* 7.3* 7.5* 7.4* 7.3*  HCT 26.3*   < > 26.2* 26.0* 26.5* 26.1* 25.5*  MCV 91.3  --  92.6 92.5 92.3  --   --   PLT 160  --  154 146* 162  --   --    < > = values in this interval not displayed.    Basic Metabolic Panel: Recent Labs  Lab 07/02/20 0409 07/02/20 0409 07/04/20 0310 07/04/20 0310 07/04/20 1109 07/05/20 0415 07/06/20 0500 07/07/20 0400 07/07/20 1451 07/08/20 0533  NA 144   < > 141   < >  --  146* 146* 149* 145 145  K 3.6   < > 3.1*   < >  --  4.0 3.5 3.8 4.0 3.8  CL 118*   < > 118*   < >  --  122* 119* 121* 119* 117*  CO2 16*   < > 16*   < >  --  18* 19* 20* 20* 20*  GLUCOSE 78   < > 99   < >  --  92 94 102* 115* 85  BUN 21   < > 24*   < >  --  22 26* 29* 32* 33*  CREATININE 2.05*   < > 1.83*   < >  --  1.85* 1.66* 1.66* 1.70* 1.61*  CALCIUM 7.3*   < > 7.0*   < >  --  7.2* 7.3* 7.2* 7.1* 7.3*  MG 1.9  --  2.0  --   --  1.9  --   --   --   --  PHOS  --   --   --   --  3.2  --   --   --  2.8 3.1   < > = values in this interval not displayed.    GFR: Estimated Creatinine Clearance: 24.4 mL/min (A) (by C-G formula based on SCr of 1.61 mg/dL (H)).  Liver Function Tests: Recent Labs  Lab 07/04/20 0310 07/05/20 0415  07/06/20 0500 07/07/20 1451 07/08/20 0533  AST 14* 15 16  --   --   ALT _0 --   --   ALKPHOS 91 92 97  --   --   BILITOT 0.5 0.6 0.6  --   --   PROT 4.5* 4.5* 4.6*  --   --   ALBUMIN 1.9* 1.9* 2.0* 2.2* 2.4*    CBG: Recent Labs  Lab 07/06/20 1623  GLUCAP 95     Recent Results (from the past 240 hour(s))  Urine Culture     Status: None   Collection Time: 07/02/20 10:57 AM   Specimen: Urine, Catheterized  Result Value Ref Range Status   Specimen Description   Final    URINE, CATHETERIZED Performed at [facility], [place] [address], [place], [place] [address]    Special Requests   Final    NONE Performed at [facility], [place] [address], [place], [place] [address]    Culture   Final    NO GROWTH Performed at [facility], [place] [address] [place],  [address]    Report Status 07/03/2020 FINAL  Final         Radiology Studies: No results found.      Scheduled Meds:  (feeding supplement) PROSource Plus  30 mL Oral BID BM   acetaminophen  1,000 mg Oral Q6H   calcium-vitamin D  1 tablet Oral TID   Chlorhexidine Gluconate Cloth  6 each Topical Daily   feeding supplement  1 Container Oral TID BM   metoprolol succinate  12.5 mg Oral Daily   midodrine  5 mg Oral TID WC   multivitamin with minerals  1 tablet Oral Daily   pantoprazole  40 mg Oral Daily   sodium bicarbonate  650 mg Oral BID   sodium chloride flush  3 mL Intravenous Q12H   torsemide  20 mg Oral Daily   Continuous Infusions:    LOS: 27 days    Time spent: 35 minutes    Irine Seal, MD [facility]   To contact the attending provider between 7A-7P or the covering provider during after hours 7P-7A, please log into the web site [email or web address] and access using universal [employer] password for that web site. If you do not have the password, please call the hospital operator.  07/08/2020, 10:42 AM

## 2020-07-08 NOTE — Plan of Care (Signed)
  Problem: Education: Goal: Knowledge of General Education information will improve Description: Including pain rating scale, medication(s)/side effects and non-pharmacologic comfort measures Outcome: Progressing   Problem: Health Behavior/Discharge Planning: Goal: Ability to manage health-related needs will improve Outcome: Progressing   Problem: Clinical Measurements: Goal: Ability to maintain clinical measurements within normal limits will improve Outcome: Progressing Goal: Will remain free from infection Outcome: Progressing Goal: Diagnostic test results will improve Outcome: Progressing Goal: Respiratory complications will improve Outcome: Progressing Goal: Cardiovascular complication will be avoided Outcome: Progressing   Problem: Activity: Goal: Risk for activity intolerance will decrease Outcome: Progressing   Problem: Nutrition: Goal: Adequate nutrition will be maintained Outcome: Progressing   Problem: Coping: Goal: Level of anxiety will decrease Outcome: Progressing   Problem: Elimination: Goal: Will not experience complications related to bowel motility Outcome: Progressing Goal: Will not experience complications related to urinary retention Outcome: Progressing   Problem: Safety: Goal: Ability to remain free from injury will improve Outcome: Progressing   Problem: Skin Integrity: Goal: Risk for impaired skin integrity will decrease Outcome: Progressing   Problem: Clinical Measurements: Goal: Ability to maintain clinical measurements within normal limits will improve Outcome: Progressing Goal: Postoperative complications will be avoided or minimized Outcome: Progressing   Problem: Skin Integrity: Goal: Demonstration of wound healing without infection will improve Outcome: Progressing

## 2020-07-08 NOTE — Plan of Care (Signed)
°  Problem: Coping: °Goal: Level of anxiety will decrease °Outcome: Progressing °  °

## 2020-07-09 ENCOUNTER — Ambulatory Visit: Payer: Medicare Other | Admitting: Cardiology

## 2020-07-09 ENCOUNTER — Inpatient Hospital Stay (HOSPITAL_COMMUNITY): Payer: Medicare Other

## 2020-07-09 DIAGNOSIS — I5021 Acute systolic (congestive) heart failure: Secondary | ICD-10-CM

## 2020-07-09 LAB — BRAIN NATRIURETIC PEPTIDE: B Natriuretic Peptide: 476.5 pg/mL — ABNORMAL HIGH (ref 0.0–100.0)

## 2020-07-09 LAB — BASIC METABOLIC PANEL
Anion gap: 9 (ref 5–15)
Anion gap: 10 (ref 5–15)
BUN: 43 mg/dL — ABNORMAL HIGH (ref 8–23)
BUN: 45 mg/dL — ABNORMAL HIGH (ref 8–23)
CO2: 20 mmol/L — ABNORMAL LOW (ref 22–32)
CO2: 20 mmol/L — ABNORMAL LOW (ref 22–32)
Calcium: 7.5 mg/dL — ABNORMAL LOW (ref 8.9–10.3)
Calcium: 7.6 mg/dL — ABNORMAL LOW (ref 8.9–10.3)
Chloride: 118 mmol/L — ABNORMAL HIGH (ref 98–111)
Chloride: 116 mmol/L — ABNORMAL HIGH (ref 98–111)
Creatinine, Ser: 1.87 mg/dL — ABNORMAL HIGH (ref 0.44–1.00)
Creatinine, Ser: 2.02 mg/dL — ABNORMAL HIGH (ref 0.44–1.00)
GFR calc Af Amer: 27 mL/min — ABNORMAL LOW (ref >60)
GFR calc Af Amer: 24 mL/min — ABNORMAL LOW (ref >60)
GFR calc non Af Amer: 23 mL/min — ABNORMAL LOW (ref >60)
GFR calc non Af Amer: 21 mL/min — ABNORMAL LOW (ref >60)
Glucose, Bld: 79 mg/dL (ref 70–99)
Glucose, Bld: 84 mg/dL (ref 70–99)
Potassium: 3.9 mmol/L (ref 3.5–5.1)
Potassium: 4 mmol/L (ref 3.5–5.1)
Sodium: 147 mmol/L — ABNORMAL HIGH (ref 135–145)
Sodium: 146 mmol/L — ABNORMAL HIGH (ref 135–145)

## 2020-07-09 LAB — ECHOCARDIOGRAM LIMITED
Area-P 1/2: 1.59 cm2
Height: 62 in
S' Lateral: 3.9 cm
Weight: 3329.83 oz

## 2020-07-09 LAB — MAGNESIUM: Magnesium: 2.1 mg/dL (ref 1.7–2.4)

## 2020-07-09 LAB — HEMOGLOBIN AND HEMATOCRIT, BLOOD
HCT: 25.7 % — ABNORMAL LOW (ref 36.0–46.0)
Hemoglobin: 7.3 g/dL — ABNORMAL LOW (ref 12.0–15.0)

## 2020-07-09 MED ORDER — SODIUM CHLORIDE 0.9 % IV SOLN
2.0000 g | INTRAVENOUS | Status: AC
  Administered 2020-07-10: 2 g via INTRAVENOUS
  Filled 2020-07-09: qty 20

## 2020-07-09 MED ORDER — FUROSEMIDE 10 MG/ML IJ SOLN: 40.0000 mg | Freq: Two times a day (BID) | INTRAMUSCULAR | Status: DC

## 2020-07-09 MED ORDER — PERFLUTREN LIPID MICROSPHERE
1.0000 mL | INTRAVENOUS | Status: AC | PRN
  Administered 2020-07-09: 2 mL via INTRAVENOUS
  Filled 2020-07-09: qty 10

## 2020-07-09 MED ORDER — FUROSEMIDE 10 MG/ML IJ SOLN
40.0000 mg | Freq: Two times a day (BID) | INTRAMUSCULAR | Status: DC
  Administered 2020-07-09 – 2020-07-12 (×4): 40 mg via INTRAVENOUS
  Filled 2020-07-09 (×6): qty 4

## 2020-07-09 NOTE — Progress Notes (Signed)
Progress Note  Patient Name: [name] Date of Encounter: 07/09/2020  Primary Cardiologist: [name], MD   Subjective   Feeing ok today with no specific complaints.   Inpatient Medications    Scheduled Meds: . (feeding supplement) PROSource Plus  30 mL Oral BID BM  . acetaminophen  1,000 mg Oral Q6H  . calcium-vitamin D  1 tablet Oral TID  . Chlorhexidine Gluconate Cloth  6 each Topical Daily  . feeding supplement  1 Container Oral TID BM  . midodrine  5 mg Oral TID WC  . multivitamin with minerals  1 tablet Oral Daily  . pantoprazole  40 mg Oral Daily  . sodium bicarbonate  650 mg Oral BID  . sodium chloride flush  3 mL Intravenous Q12H  . torsemide  20 mg Oral Daily   Continuous Infusions: . [START ON 07/10/2020] cefTRIAXone (ROCEPHIN)  IV     PRN Meds: HYDROmorphone (DILAUDID) injection, levalbuterol, metoprolol tartrate, ondansetron (ZOFRAN) IV, oxyCODONE, sodium chloride flush   Vital Signs    Vitals:   07/08/20 0923 07/08/20 1232 07/08/20 1956 07/09/20 0600  BP: (!) 102/55 (!) 92/42 (!) 102/53 (!) 90/46  Pulse: 71 69 71 68  Resp:  18 20 20   Temp:  97.6 F (36.4 C) (!) 97.5 F (36.4 C) (!) 97.4 F (36.3 C)  TempSrc:  Oral Oral Oral  SpO2:  99% 96% 97%  Weight:      Height:        Intake/Output Summary (Last 24 hours) at 07/09/2020 0803 Last data filed at 07/09/2020 0600 Gross per 24 hour  Intake 120 ml  Output 450 ml  Net -330 ml   Filed Weights   07/06/20 0538 07/07/20 0500 07/08/20 0554  Weight: 94.2 kg 93.6 kg 94.4 kg    Physical Exam   General: Elderly, NAD Neck: Negative for carotid bruits. No JVD Lungs: + bilaterally wheezes, no rales, or rhonchi. Breathing is unlabored. Cardiovascular: Irregularly irregular with S1 S2. + murmurs Extremities: Mild 1+ edema. Radial pulses 2+ bilaterally Neuro: Alert and oriented. No focal deficits. No facial asymmetry. MAE spontaneously. Psych: Responds to questions appropriately with normal  affect.    Labs    Chemistry Recent Labs  Lab 07/04/20 0310 07/04/20 0310 07/05/20 0415 07/05/20 0415 07/06/20 0500 07/07/20 0400 07/07/20 1451 07/08/20 0533 07/09/20 0304  NA 141   < > 146*   < > 146*   < > 145 145 147*  K 3.1*   < > 4.0   < > 3.5   < > 4.0 3.8 3.9  CL 118*   < > 122*   < > 119*   < > 119* 117* 118*  CO2 16*   < > 18*   < > 19*   < > 20* 20* 20*  GLUCOSE 99   < > 92   < > 94   < > 115* 85 79  BUN 24*   < > 22   < > 26*   < > 32* 33* 43*  CREATININE 1.83*   < > 1.85*   < > 1.66*   < > 1.70* 1.61* 1.87*  CALCIUM 7.0*   < > 7.2*   < > 7.3*   < > 7.1* 7.3* 7.5*  PROT 4.5*  --  4.5*  --  4.6*  --   --   --   --   ALBUMIN 1.9*   < > 1.9*   < > 2.0*  --  2.2* 2.4*  --   AST 14*  --  15  --  16  --   --   --   --   ALT 9  --  9  --  10  --   --   --   --   ALKPHOS 91  --  92  --  97  --   --   --   --   BILITOT 0.5  --  0.6  --  0.6  --   --   --   --   GFRNONAA 24*   < > 23*   < > 27*   < > 26* 28* 23*  GFRAA 27*   < > 27*   < > 31*   < > 30* 32* 27*  ANIONGAP 7   < > 6   < > 8   < > 6 8 9    < > = values in this interval not displayed.     Hematology Recent Labs  Lab 07/04/20 0310 07/04/20 0310 07/05/20 0415 07/05/20 0415 07/06/20 0500 07/06/20 0500 07/07/20 0400 07/08/20 0531 07/09/20 0304  WBC 6.8  --  7.0  --  7.0  --   --   --   --   RBC 2.83*  --  2.81*  --  2.87*  --   --   --   --   HGB 7.4*   < > 7.3*   < > 7.5*   < > 7.4* 7.3* 7.3*  HCT 26.2*   < > 26.0*   < > 26.5*   < > 26.1* 25.5* 25.7*  MCV 92.6  --  92.5  --  92.3  --   --   --   --   MCH 26.1  --  26.0  --  26.1  --   --   --   --   MCHC 28.2*  --  28.1*  --  28.3*  --   --   --   --   RDW 21.9*  --  22.5*  --  22.8*  --   --   --   --   PLT 154  --  146*  --  162  --   --   --   --    < > = values in this interval not displayed.    Cardiac EnzymesNo results for input(s): TROPONINI in the last 168 hours. No results for input(s): TROPIPOC in the last 168 hours.   BNPNo results for  input(s): BNP, PROBNP in the last 168 hours.   DDimer No results for input(s): DDIMER in the last 168 hours.   Radiology    No results found.  Telemetry    07/09/20 AF rates controlled - Personally Reviewed  ECG    No new tracing as of 07/09/20- Personally Reviewed  Cardiac Studies   Echo 04/05/20:  1. Left ventricular ejection fraction, by estimation, is 40%. The left  ventricle has mildly decreased function. The left ventricle demonstrates  global hypokinesis. The left ventricular internal cavity size was mildly  dilated. Left ventricular diastolic  parameters are indeterminate.  2. Right ventricular systolic function is moderately reduced. The right  ventricular size is moderately enlarged. The estimated right ventricular  systolic pressure is 33.2 mmHg.  3. Left atrial size was severely dilated.  4. Right atrial size was severely dilated.  5. The mitral valve has been repaired/replaced. Mild mitral valve  regurgitation. No evidence of  mitral stenosis however Doppler alignment of  diastolic gradient is slightly suboptimal. The mean mitral valve gradient  is 2.0 mmHg with average heart rate of  70 bpm.  6. Tricuspid valve regurgitation is severe.  7. The aortic valve is tricuspid. Aortic valve regurgitation is mild. No  aortic stenosis is present.  8. The inferior vena cava is dilated in size with <50% respiratory  variability, suggesting right atrial pressure of 15 mmHg.   Patient Profile     86 y.o. female with a hx of chronic combined systolic/diastolic heart failure, tachybrady syndrome status post BiV PPM, severe MR status post MV repair in 2003, CKD stage III, chronic atrial fibrillation not on anticoagulation due to GI bleed, orthostatic hypotension, moderate pulmonary hypertension, severe TR who is being seen today for the evaluation of heart failure at the request of Dr. Grandville Silos.  Assessment & Plan    1. Acute on chronic combined systolic and diastolic  heart failure:  -Pt has a hx of systolic dysfunction per echocardiogram from 04/05/20 with LVEF 40%, global hypokinesis, moderate RV dysfunction, mild mitral regurgitation, severe tricuspid regurgitation.   -I&O, net positive 7.8L -Weight, 208lb>>>weight continues to rise  -BNP>>pending  -Plan to re-check echocardiogram today  -Continue torsemide 20>>may need extra lasix today and follow renal function   2. Permanent atrial fibrillation:  -Not on anticoagulation due to GI bleed history.  -HRs well controlled on Toprol 12.5 however this was discontinued per family request due to concern over recent hypotension requiring midodrine   3. Tachybrady syndrome:  -s/p BiV PPM  4. NSVT: -No recurrence on telemetry review -Maintain K>4, Mag>2  5. AKI on CKD stage III:  -Peak creatinine 3 and stabilizing ;per labs today at 1.87 however up from 1.61 yesterday   6. 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   -Management per oncology, general surgery, and palliative care    7. Sepsis:  -Resolved, due to UTI.   -Completed course of Augmentin.  8. Obstructive uropathy:  -S/p ureteral stent 06/11/2020.  -Stent removal planned 07/10/2020  9. Hypotension:  -Chronic issue, on midodrine -BP-90/46>102/53>92/42  Signed, Kathyrn Drown NP-C [facility] Pager: [phone] 07/09/2020, 8:03 AM     For questions or updates, please contact   Please consult [email or web address] for contact info under Cardiology/STEMI.

## 2020-07-09 NOTE — H&P (View-Only) (Signed)
17 Days Post-Op  Subjective: [name] is doing well recovering from her colon surgery.  She reports no flank pain or voiding complaints but is being managed with a purewick.  Her Cr was up slightly yesterday to 1.87 and her Hgb is stable at 7.3.   She has no associated signs or symptoms.  ROS:  Review of Systems  Constitutional: Negative for chills and fever.  Respiratory: Negative for shortness of breath.   Cardiovascular: Negative for chest pain.    Anti-infectives: Anti-infectives (From admission, onward)   Start     Dose/Rate Route Frequency Ordered Stop   06/28/20 1415  amoxicillin-clavulanate (AUGMENTIN) 500-125 MG per tablet 500 mg  Status:  Discontinued        1 tablet Oral 2 times daily 06/28/20 1316 07/03/20 1553   06/24/20 2200  piperacillin-tazobactam (ZOSYN) IVPB 3.375 g  Status:  Discontinued        3.375 g 12.5 mL/hr over 240 Minutes Intravenous Every 8 hours 06/24/20 2056 06/28/20 1316   06/22/20 0911  sodium chloride 0.9 % with cefoTEtan (CEFOTAN) ADS Med       Note to Pharmacy: Georgena Spurling   : cabinet override      06/22/20 0911 06/22/20 1020   06/21/20 1115  cefoTEtan (CEFOTAN) 2 g in sodium chloride 0.9 % 100 mL IVPB        2 g 200 mL/hr over 30 Minutes Intravenous On call to O.R. 06/20/20 1346 06/22/20 0559   06/20/20 2200  amoxicillin (AMOXIL) capsule 500 mg  Status:  Discontinued        500 mg Oral Every 8 hours 06/20/20 1950 06/24/20 0725   06/14/20 1000  amoxicillin (AMOXIL) capsule 500 mg        500 mg Oral Every 12 hours 06/14/20 0741 06/19/20 0959   06/11/20 1030  cefTRIAXone (ROCEPHIN) 1 g in sodium chloride 0.9 % 100 mL IVPB  Status:  Discontinued        1 g 200 mL/hr over 30 Minutes Intravenous Every 24 hours 06/11/20 1021 06/14/20 0741   06/11/20 0300  cefTRIAXone (ROCEPHIN) 1 g in sodium chloride 0.9 % 100 mL IVPB        1 g 200 mL/hr over 30 Minutes Intravenous  Once 06/11/20 0259 06/11/20 0458      Current Facility-Administered Medications   Medication Dose Route Frequency Provider Last Rate Last Admin  . (feeding supplement) PROSource Plus liquid 30 mL  30 mL Oral BID BM Eugenie Filler, MD   30 mL at 07/08/20 1639  . acetaminophen (TYLENOL) tablet 1,000 mg  1,000 mg Oral Q6H Romana Juniper A, MD   1,000 mg at 07/09/20 0600  . calcium-vitamin D (OSCAL WITH D) 500-200 MG-UNIT per tablet 1 tablet  1 tablet Oral TID Eugenie Filler, MD   1 tablet at 07/08/20 2137  . Chlorhexidine Gluconate Cloth 2 % PADS 6 each  6 each Topical Daily Eugenie Filler, MD   6 each at 07/08/20 1000  . feeding supplement (BOOST / RESOURCE BREEZE) liquid 1 Container  1 Container Oral TID BM Jovita Kussmaul, MD   1 Container at 07/08/20 1957  . HYDROmorphone (DILAUDID) injection 0.5-1 mg  0.5-1 mg Intravenous Q1H PRN Autumn Messing III, MD   1 mg at 06/25/20 0851  . levalbuterol (XOPENEX) nebulizer solution 0.63 mg  0.63 mg Nebulization Q6H PRN Eugenie Filler, MD      . metoprolol tartrate (LOPRESSOR) injection 5 mg  5 mg Intravenous Q6H  PRN Autumn Messing III, MD      . midodrine (PROAMATINE) tablet 5 mg  5 mg Oral TID WC Autumn Messing III, MD   5 mg at 07/08/20 1639  . multivitamin with minerals tablet 1 tablet  1 tablet Oral Daily Eugenie Filler, MD   1 tablet at 07/08/20 [phone]  . ondansetron (ZOFRAN) injection 4 mg  4 mg Intravenous Q4H PRN Romana Juniper A, MD   4 mg at 07/08/20 1808  . oxyCODONE (Oxy IR/ROXICODONE) immediate release tablet 2.5 mg  2.5 mg Oral Q4H PRN Autumn Messing III, MD   2.5 mg at 06/27/20 2224  . pantoprazole (PROTONIX) EC tablet 40 mg  40 mg Oral Daily Eugenie Filler, MD   40 mg at 07/08/20 2035  . sodium bicarbonate tablet 650 mg  650 mg Oral BID Autumn Messing III, MD   650 mg at 07/08/20 2137  . sodium chloride flush (NS) 0.9 % injection 10-40 mL  10-40 mL Intracatheter PRN Eugenie Filler, MD   10 mL at 06/25/20 1754  . sodium chloride flush (NS) 0.9 % injection 3 mL  3 mL Intravenous Q12H Autumn Messing III, MD   3 mL at  07/08/20 2138  . torsemide (DEMADEX) tablet 20 mg  20 mg Oral Daily Eugenie Filler, MD   20 mg at 07/08/20 5974     Objective: Vital signs in last 24 hours: Temp:  [97.4 F (36.3 C)-97.6 F (36.4 C)] 97.4 F (36.3 C) (08/23 0600) Pulse Rate:  [68-71] 68 (08/23 0600) Resp:  [18-20] 20 (08/23 0600) BP: (90-102)/(42-55) 90/46 (08/23 0600) SpO2:  [96 %-99 %] 97 % (08/23 0600)  Intake/Output from previous day: 08/22 0701 - 08/23 0700 In: 120 [P.O.:120] Out: 450 [Urine:450] Intake/Output this shift: No intake/output data recorded.   Physical Exam Vitals reviewed.  Constitutional:      Appearance: Normal appearance.  Cardiovascular:     Rate and Rhythm: Normal rate and regular rhythm.  Pulmonary:     Effort: Pulmonary effort is normal. No respiratory distress.  Musculoskeletal:     Right lower leg: No edema.     Left lower leg: No edema.  Neurological:     Mental Status: She is alert.     Lab Results:  Recent Labs    07/08/20 0531 07/09/20 0304  HGB 7.3* 7.3*  HCT 25.5* 25.7*   BMET Recent Labs    07/08/20 0533 07/09/20 0304  NA 145 147*  K 3.8 3.9  CL 117* 118*  CO2 20* 20*  GLUCOSE 85 79  BUN 33* 43*  CREATININE 1.61* 1.87*  CALCIUM 7.3* 7.5*   PT/INR No results for input(s): LABPROT, INR in the last 72 hours. ABG No results for input(s): PHART, HCO3 in the last 72 hours.  Invalid input(s): PCO2, PO2  Studies/Results: Urine culture was negative on 8/16.     Assessment and Plan: Multiple left ureteral stones.   We will proceed with left ureteroscopy on 8/24 as planned.   Preop orders placed.         LOS: 28 days    [name] 07/09/2020 [phone] ID: [name], female   DOB: 30-Nov-1925, 86 y.o.   MRN: [id number]

## 2020-07-09 NOTE — Progress Notes (Addendum)
PROGRESS NOTE    [name]  VVO:[id number] DOB: September 02, 1926 DOA: 06/11/2020 PCP: [name], MD   No chief complaint on file.   Brief Narrative:  86 year old lady with prior history of tachybradycardia syndrome s/p biventricular pacemaker placement in 2007, severe MR s/p repair, chronic atrial fibrillation not on anticoagulation secondary to GI bleed, abnormal esophageal motility consistent with presbyesophagus, gastritis on last endoscopy with balloon dilatation by [name], pulmonary hypertension presents with 2 days of dysuria, and abdominal pain, black stools for several weeks, and increased confusion admitted for sepsis secondary to urinary tract infection in the setting of obstructive uropathy and acute on chronic anemia.  CT of the abdomen and pelvis shows left-sided moderate hydronephrosis with intrarenal calculi bilaterally, left ureteral calculi and soft tissue fullness in the cecal area.  Patient underwent ureteral stent placement by urology on 06/11/2020.  And was started on IV Rocephin and admitted to [facility] service for further evaluation and management. Urine culture showed lactobacillus transition IV Rocephin to oral amoxicillin for another 2 weeks to complete the course. [name] with Urology suggested he will post pone the stent removal to a later date. She underwent CT abd showing cecal mass concerning for malignancy. She underwent colonoscopy on 06/18/20 by [name] showing large cecal mass concerning for malignancy. Gen surgery consulted for further management. Cecal mass pathology shows adenocarcinoma. Cardiology consulted for pre op clearance as she might need surgical intervention.  Oncology consulted who recommended CT chest for further staging.  Patient underwent surgery 06/21/2020 with pathology consistent with adenocarcinoma involving the cecum invading into subserosal area without lymphovascular or perineural invasion, 20 lymph nodes were negative for CA-CEA was 575  on 06/19/2020.  Hospital course complicated by probable ileus as well as multiple dark watery stools, patient reassessed by surgery and Reglan added.  Patient diet advanced to a solid diet since 06/30/2020 which has been tolerating.  Patient noted to have some oozing had a subcutaneous hematoma removed 07/02/2020 and wound VAC placed over base of incision. Patient also noted to have some runs of nonsustained V. tach..   Assessment & Plan:   Principal Problem:   Acute lower UTI Active Problems:   Liver cirrhosis (HCC)   CKD (chronic kidney disease) stage 3, GFR 30-59 ml/min   Anemia   Biventricular cardiac pacemaker in situ   Permanent atrial fibrillation (HCC)   Chronic diastolic CHF (congestive heart failure) (HCC)   Gout   S/P MVR (mitral valve repair)   AKI (acute kidney injury) ([place])   Orthostatic hypotension dysautonomic syndrome (HCC)   GI bleeding   Obstructive uropathy   Sepsis secondary to UTI ([place])   Hydronephrosis of left kidney   Left ureteral stone   Advanced care planning/counseling discussion   Goals of care, counseling/discussion   Palliative care by specialist   DNR (do not resuscitate)   Acute on chronic renal insufficiency   Iron deficiency anemia due to chronic blood loss   Cancer of cecum (HCC)   Emesis   Gastritis with hemorrhage   Acute on chronic diastolic CHF (congestive heart failure) (HCC)   Ileus (HCC)  1 SIRS/Sepsis secondary to lactobacillus UTI Patient on admission met criteria for systemic inflammatory response with, MAP < 65, AKI, urinalysis worrisome for UTI.  Urine cultures positive for lactobacillus.  Patient noted to have left-sided moderate hydronephrosis secondary to left ureteral stone and underwent left ureteral stent placement 06/11/2020.  Patient was on IV Rocephin which has subsequently been discontinued.  Amoxicillin started  06/20/2020 to complete a 14-day course of antibiotic treatment as patient with stent placement and left urethral  stone.  Patient due to emesis and concern for possible aspiration pneumonia amoxicillin discontinued and patient placed on IV Zosyn.  Patient improved clinically was transitioned to Augmentin and completed a full course of antibiotic treatment.  No further antibiotics needed.  Follow.    2.  Obstructive uropathy with left-sided moderate hydronephrosis secondary to left ureteral stone Noted on CT abdomen and pelvis.  Patient seen in consultation by urology and underwent left ureteral stent placement 06/11/2020.  Foley catheter initially discontinued and placed back in perioperatively.  Foley catheter has been discontinued. Urine cultures positive for lactobacillus.  Patient on IV Rocephin which was subsequently discontinued.  Amoxicillin started 06/20/2020 to complete a 10 - 14-day course of antibiotic treatment.  Patient with good urine output.  Clinical improvement.  Per urology reschedule uteroscopy for about 2 to 3 weeks out to give patient opportunity to recover from pending colon resection.  Patient with bout of nausea and emesis 06/24/2020, concern for aspiration pneumonia and as such antibiotic were changed to IV Zosyn.  IV Zosyn was subsequently transitioned to Augmentin and patient completed full course of antibiotic treatment.  No further antibiotics needed.  Patient for stent removal on Tuesday, 07/10/2020 which likely will be done inpatient as patient still requiring medical management for volume overload/hyponatremia.  Urology following and appreciate input and recommendations.     3.  Cecal adenocarcinoma Soft tissue fullness noted in cecal area on CT abdomen and pelvis.  Repeat CT abdomen and pelvis showing a lobulated mass in the cecum concerning for malignancy.  GI consulted and patient underwent colonoscopy with biopsy on 06/18/2020 per Dr. Michail Sermon with pathology positive for adenocarcinoma.  Patient seen in consultation by general surgery to see whether she is a candidate for diverting colostomy.   Patient seen in consultation by cardiology for preop clearance.  Patient currently not obstructed at this time.  CT chest with interval development of 10 mm subpleural rounded density seen posteriorly in the right lung apex.  Follow-up unenhanced chest CT in 3 months recommended to ensure stability or resolution.  Patient s/p laparoscopic right colectomy 06/22/2020.  Oncology consulted and are following and will evaluate postop to determine if further adjuvant treatment is feasible or warranted.  Palliative care also following.  Patient was tolerating a clear liquid diet and diet advanced to a soft diet.  Patient noted to have nausea and emesis the afternoon of 06/24/2020 and placed on a clear liquid diet.  Abdominal films obtained concerning for ileus versus SBO.  Ileus resolved.  Currently tolerating a soft diet.  Will likely follow-up with oncology in the outpatient setting.  Appreciate general surgery, oncology, palliative care input and recommendations.  4.  Fascial deficit status post hematoma removal from base of wound 07/02/2020 Patient being followed by wound care nurse as well as general surgery.  Wound VAC placed.  Per general surgery.  5.  Nausea and emesis/?  Ileus versus SBO Patient noted to have nausea and emesis the afternoon of 06/24/2020 after being started on a soft diet.  Abdominal films obtained concerning for ileus versus SBO bowel gas pattern.  Patient was started on Reglan.  Patient having bowel movements.  Ileus resolved.  Tolerating diet.  Per general surgery.  Supportive care.   6.??  Aspiration pneumonia Patient with bout of nausea and emesis on 06/24/2020.  Acute abdominal series done with left pleural effusion with left lower  lobe atelectasis or infiltrate, cardiomegaly, increasing right basilar atelectasis or infiltrate.  Amoxicillin was discontinued patient was placed on IV Zosyn.  Patient subsequently transitioned from IV Zosyn to Augmentin and has completed a full course of  antibiotic treatment.  No further antibiotics needed.   7.  Acute on chronic  combined systolic diastolic heart failure Patient with expiratory wheezing (early on in the hospitalization, which has since improved with diuresis), 1-2+ bilateral lower extremity edema over the past few days which has improved with diuresis..  Patient is +10 L during this hospitalization however unsure of accuracy of I's and O's.  Patient noted to have a weight of 94.4 kg(07/08/2020) from 93.6 kg from 94.2 kg from 94 kg from 93.6 kg (07/03/2020) from 87.1 kg (07/02/2020).  2D echo from May 2021 with a EF of 40%.  Patient denies any significant shortness of breath. Patient received a dose of IV Lasix on 06/21/2020.  Patient on Lasix 20 mg daily however was on Demadex prior to admission.  Patient placed on Lasix 20 mg IV every 12 hours for the past 2 to 3 days.  Discontinued IV Lasix.  Status post IV albumin x1.  Home dose Demadex resumed.  Strict I's and O's.  Daily weights.  Patient also noted to have some expiratory wheezing this morning.  Family requesting cardiology consultation to manage patient's cardiac issues while hospitalized.  Cardiology consulted and patient placed on Lasix 40 mg IV twice daily today.  ??  Increasing midodrine to allow for further diuresis however will defer to cardiology.  Per cardiology.  8.  Chronic hypotension Midodrine.    9.  Permanent atrial fibrillation Was rate controlled on beta-blocker however per family request and due to concerns of chronic hypotension beta-blocker has been discontinued.  Patient not a anticoagulation candidate secondary to history of GI bleed.  Cardiology was following however have signed off.  Patient's daughter concerned about patient receiving beta-blocker in the setting of her chronic hypotension (on midodrine) and requesting beta-blocker be discontinued with consultation of cardiology to manage patient's cardiac issues.  Cardiology consulted and are following.    10.   Tachybradycardia syndrome with BiV PPM/nonsustained V. tach Stable.  Last interrogation April 2021 patient noted to be V pacing.  Patient noted to have a bout of nonsustained V. tach the morning of 07/05/2020 per RN.  Potassium currently at 3.8.  Keep potassium > 4.  Keep magnesium >2.  Patient was on beta-blocker which has been discontinued per family request due to patient's chronic hypotension.  Patient with chronic hypotension on midodrine.  Daughter concerned about patient receiving beta-blocker in the setting of her chronic hypotension and requesting evaluation by cardiology to help manage patient's chronic cardiac issues, and discontinuation of beta-blocker.  Beta-blocker has been discontinued.  Cardiology consulted and are following.   11.  Acute on chronic kidney disease stage IIIb Likely post renal azotemia in the setting of obstructive uropathy in the setting of sepsis and relative hypotension on admission.  Creatinine noted to have peaked at 3 and trended down.  Creatinine currently at 1.61.  Urine output not properly recorded over the past 24 hours.  Patient received a dose of IV Lasix on 06/21/2020.  Demadex was resumed however held due to nausea and vomiting.  Creatinine trending down with diuresis.  Patient was on oral Lasix 20 mg daily, however patient with some expiratory wheezing and some lower extremity edema due to concerns for volume overload oral Lasix has been changed to IV Lasix.  Patient noted to have been +10 L during this hospitalization however doubt accuracy of I's and O's.  Patient given a dose of Lasix 20 mg IV x1 on 07/04/2020.  Patient supposedly is +10.148 L during this hospitalization however unsure about accuracy of I's and O's.  Patient with a bump in sodium levels.  DC IV Lasix.  Home regimen oral Demadex resumed.  Current weight of of 94.4 kg from 93.6 kg from 94.2 kg from 94 kg from 93.6 kg (07/03/2020) from 87.1 kg (07/02/2020). Follow.   12.  Hypernatremia Likely secondary  to free water deficit secondary to poor oral intake.  Slight bump in sodium level.  Diuretics initially held and subsequently patient restarted on low-dose Lasix 20 mg daily.  Concern for volume overload and as such patient started on IV Lasix.  Sodium increased as high as 149.  IV Lasix discontinued.  Patient hydrated gently with D5W at 50 cc an hour for 12 hours on 07/07/2020.  Sodium level at 147 this morning.  Patient back on oral home dose Demadex.  Monitor closely as patient likely to be placed on IV diuretics per cardiology recommendations.  Follow.    13.  Iron deficiency anemia secondary to colonic cancer bleed/anemia of chronic disease Status post recent EGD showing gastritis and presbyesophagus.  Baseline hemoglobin approximately 9.  On admission hemoglobin noted to be 7.9.  Status post 1 unit transfusion packed red blood cells hemoglobin currently stable at 7.3.  Status post IV Feraheme 510 mg x 2( 06/24/2020) and 06/30/2020.  Transfusion threshold hemoglobin < 7.  14.  Vitamin D deficiency Os-Cal with vitamin D 3 times daily.  Outpatient follow-up.  15.??  Confusion versus sundowning Patient with some bouts of confusion usually starting in the evening after 4 PM.  Likely sundowning versus secondary to long hospital stay.  TSH slightly elevated.  Free T4 within normal limits.  Ammonia level at 35.  Vitamin B12 at 362.  Folate at 8.6.  Patient with no focal neurological deficits.  Daughter requesting cardiology consultation as he feels beta-blocker in the setting of patient's chronic hypotension may be leading to some confusion and requesting beta-blocker be discontinued.  Beta-blocker discontinued.  Cardiology consulted and are following.     DVT prophylaxis: SCDs.  History of GI bleed. Code Status: full Family Communication: Updated patient.  No family at bedside.  Disposition:   Status is: Inpatient    Dispo: The patient is from: Assisted living facility              Anticipated d/c  is to: SNF              Anticipated d/c date is: 07/11/2020              Patient currently worked up for cecal cancer and s/p laparoscopic right colectomy 06/22/2020.  Patient with ileus which has since resolved.  Patient with expiratory wheezing concern for volume overload which is improving.  Patient being diuresed and IV Lasix discontinued.  General surgery following.  Oncology was following.  Patient not stable for discharge.        Consultants:   General surgery: Dr.Toth III 06/19/2020  Cardiology: Dr. Radford Pax 06/19/2020  Gastroenterology: Dr. Watt Climes 06/11/2020  Urology: Dr. Jeffie Pollock 06/11/2020  Oncology Dr. Lorenso Courier 06/20/2020  Palliative care: Dr. Rowe Pavy 06/20/2020  Procedures:  CT chest 06/28/2020  CT abdomen and pelvis 06/15/2020, 06/11/2020, 07/02/2020  Chest x-ray 06/11/2020  Colonoscopy 06/18/2020--per Dr. Michail Sermon gastroenterology  Cystoscopy with left retrograde pyelogram and  interpretation/cystoscopy with insertion of left double-J stent per Dr.Wrenn 06/11/2020  Transfusion 1 unit packed red blood cells 06/11/2020  Laparoscopic right colectomy by Dr. Marlou Starks III 06/22/2020  Evacuation of subcutaneous hematoma per general surgery 07/02/2020  Antimicrobials:  IV Rocephin 06/11/2020>>>> 06/14/2020  Amoxicillin 06/20/2020>>>>> 06/24/2020  IV Zosyn 06/24/2020>>>>> 06/28/2020  Augmentin 06/28/2020>>>> 07/03/2020   Subjective: Sitting up in chair.  Denies chest pain or shortness of breath.  Tolerating current diet.   Objective: Vitals:   07/08/20 1232 07/08/20 1956 07/09/20 0600 07/09/20 1116  BP: (!) 92/42 (!) 102/53 (!) 90/46 (!) 89/43  Pulse: 69 71 68 70  Resp: '18 20 20 18  ' Temp: 97.6 F (36.4 C) (!) 97.5 F (36.4 C) (!) 97.4 F (36.3 C)   TempSrc: Oral Oral Oral   SpO2: 99% 96% 97% 98%  Weight:      Height:        Intake/Output Summary (Last 24 hours) at 07/09/2020 1223 Last data filed at 07/09/2020 1000 Gross per 24 hour  Intake 120 ml  Output 450 ml  Net -330 ml   Filed  Weights   07/06/20 0538 07/07/20 0500 07/08/20 0554  Weight: 94.2 kg 93.6 kg 94.4 kg    Examination:  General exam: NAD. Respiratory system: Some decreased breath sounds in the bases.  Minimal to mild expiratory wheezing.  Fair air movement.  Speaking in full sentences.  No use of accessory muscles of respiration.  Cardiovascular system: Irregularly irregular, no murmurs rubs or gallops.  No JVD.  Trace bilateral lower extremity edema. Gastrointestinal system: Abdomen is soft, nontender, obese, nondistended, positive bowel sounds.  No rebound.  No guarding.  Wound VAC over mid abdominal region.  Central nervous system: Alert.  No focal neurological deficits.  Moving extremities spontaneously.  Extremities: Symmetric 5 x 5 power. Skin: No rashes, lesions or ulcers Psychiatry: Judgement and insight appear fair. Mood & affect appropriate.     Data Reviewed: I have personally reviewed following labs and imaging studies  CBC: Recent Labs  Lab 07/04/20 0310 07/04/20 0310 07/05/20 0415 07/06/20 0500 07/07/20 0400 07/08/20 0531 07/09/20 0304  WBC 6.8  --  7.0 7.0  --   --   --   NEUTROABS 4.1  --  4.2 4.6  --   --   --   HGB 7.4*   < > 7.3* 7.5* 7.4* 7.3* 7.3*  HCT 26.2*   < > 26.0* 26.5* 26.1* 25.5* 25.7*  MCV 92.6  --  92.5 92.3  --   --   --   PLT 154  --  146* 162  --   --   --    < > = values in this interval not displayed.    Basic Metabolic Panel: Recent Labs  Lab 07/04/20 0310 07/04/20 0310 07/04/20 1109 07/05/20 0415 07/05/20 0415 07/06/20 0500 07/07/20 0400 07/07/20 1451 07/08/20 0533 07/09/20 0304  NA 141   < >  --  146*   < > 146* 149* 145 145 147*  K 3.1*   < >  --  4.0   < > 3.5 3.8 4.0 3.8 3.9  CL 118*   < >  --  122*   < > 119* 121* 119* 117* 118*  CO2 16*   < >  --  18*   < > 19* 20* 20* 20* 20*  GLUCOSE 99   < >  --  92   < > 94 102* 115* 85 79  BUN 24*   < >  --  22   < > 26* 29* 32* 33* 43*  CREATININE 1.83*   < >  --  1.85*   < > 1.66* 1.66*  1.70* 1.61* 1.87*  CALCIUM 7.0*   < >  --  7.2*   < > 7.3* 7.2* 7.1* 7.3* 7.5*  MG 2.0  --   --  1.9  --   --   --   --   --   --   PHOS  --   --  3.2  --   --   --   --  2.8 3.1  --    < > = values in this interval not displayed.    GFR: Estimated Creatinine Clearance: 21 mL/min (A) (by C-G formula based on SCr of 1.87 mg/dL (H)).  Liver Function Tests: Recent Labs  Lab 07/04/20 0310 07/05/20 0415 07/06/20 0500 07/07/20 1451 07/08/20 0533  AST 14* 15 16  --   --   ALT '9 9 10  ' --   --   ALKPHOS 91 92 97  --   --   BILITOT 0.5 0.6 0.6  --   --   PROT 4.5* 4.5* 4.6*  --   --   ALBUMIN 1.9* 1.9* 2.0* 2.2* 2.4*    CBG: Recent Labs  Lab 07/06/20 1623  GLUCAP 95     Recent Results (from the past 240 hour(s))  Urine Culture     Status: None   Collection Time: 07/02/20 10:57 AM   Specimen: Urine, Catheterized  Result Value Ref Range Status   Specimen Description   Final    URINE, CATHETERIZED Performed at [facility], [place] [address], [place], [place] [address]    Special Requests   Final    NONE Performed at [facility], [place] [address] [place], [place] [address]    Culture   Final    NO GROWTH Performed at [facility], [place] [address], [place], [place] [address]    Report Status 07/03/2020 FINAL  Final         Radiology Studies: ECHOCARDIOGRAM LIMITED  Result Date: 07/09/2020    ECHOCARDIOGRAM LIMITED REPORT   Patient Name:   [name] Date of Exam: 07/09/2020 Medical Rec #:  [id number]      Height:       62.0 in Accession #:    [id number]     Weight:       208.1 lb Date of Birth:  Mar 27, 1926     BSA:          1.944 m Patient Age:    28 years       BP:           90/46 mmHg Patient Gender: F              HR:           70 bpm. Exam Location:  Inpatient Procedure: Limited Echo, Color Doppler, Cardiac Doppler and Intracardiac            Opacification Agent Indications:    W38.93 Acute systolic (congestive) heart  failure  History:        Patient has prior history of Echocardiogram examinations, most                 recent 04/05/2020. CHF, Pacemaker, Arrythmias:Atrial                 Fibrillation; Risk Factors:Hypertension. 2003 72m Mitral  Annuloplasty Ring implanted.                  Mitral Valve: 28 mm prosthetic annuloplasty ring valve is                 present in the mitral position.  Sonographer:    Raquel Sarna Senior RDCS Referring Phys: [id number] Donato Heinz  Sonographer Comments: Technically challenging study due to limited acoustic windows. Limited to assess systolic function, valves, and IVC. IMPRESSIONS  1. Left ventricular ejection fraction, by estimation, is 40 to 45%. The left ventricle has mildly decreased function. The left ventricle demonstrates global hypokinesis. Left ventricular diastolic function could not be evaluated.  2. Right ventricular systolic function is mildly reduced. The right ventricular size is mildly enlarged. There is moderately elevated pulmonary artery systolic pressure. The estimated right ventricular systolic pressure is 74.1 mmHg.  3. Left atrial size was severely dilated.  4. Right atrial size was severely dilated.  5. The mitral valve has been repaired/replaced. Trivial mitral valve regurgitation. The mean mitral valve gradient is 4.0 mmHg with average heart rate of 70 bpm. There is a 28 mm prosthetic annuloplasty ring present in the mitral position.  6. The pericardial effusion is posterior to the left ventricle. Moderate pleural effusion in the left lateral region.  7. The tricuspid valve is abnormal. Tricuspid valve regurgitation is moderate to severe.  8. The aortic valve is tricuspid. Mild aortic valve sclerosis is present, with no evidence of aortic valve stenosis.  9. The inferior vena cava is dilated in size with <50% respiratory variability, suggesting right atrial pressure of 15 mmHg. Comparison(s): Changes from prior study are noted. 04/05/20: LVEF 40%,  severe TR. FINDINGS  Left Ventricle: Left ventricular ejection fraction, by estimation, is 40 to 45%. The left ventricle has mildly decreased function. The left ventricle demonstrates global hypokinesis. Definity contrast agent was given IV to delineate the left ventricular  endocardial borders. The left ventricular internal cavity size was normal in size. There is borderline left ventricular hypertrophy. Abnormal (paradoxical) septal motion, consistent with RV pacemaker. Left ventricular diastolic function could not be evaluated due to paced rhythm. Right Ventricle: The right ventricular size is mildly enlarged. No increase in right ventricular wall thickness. Right ventricular systolic function is mildly reduced. There is moderately elevated pulmonary artery systolic pressure. The tricuspid regurgitant velocity is 2.76 m/s, and with an assumed right atrial pressure of 15 mmHg, the estimated right ventricular systolic pressure is 28.7 mmHg. Left Atrium: Left atrial size was severely dilated. Right Atrium: Right atrial size was severely dilated. Pericardium: A small pericardial effusion is present. The pericardial effusion is posterior to the left ventricle. Mitral Valve: The mitral valve has been repaired/replaced. Trivial mitral valve regurgitation. There is a 28 mm prosthetic annuloplasty ring present in the mitral position. MV peak gradient, 10.8 mmHg. The mean mitral valve gradient is 4.0 mmHg with average heart rate of 70 bpm. Tricuspid Valve: The tricuspid valve is abnormal. Tricuspid valve regurgitation is moderate to severe. Aortic Valve: The aortic valve is tricuspid. Mild aortic valve sclerosis is present, with no evidence of aortic valve stenosis. Pulmonic Valve: The pulmonic valve was grossly normal. Pulmonic valve regurgitation is trivial. Venous: The inferior vena cava is dilated in size with less than 50% respiratory variability, suggesting right atrial pressure of 15 mmHg. IAS/Shunts: No atrial  level shunt detected by color flow Doppler. Additional Comments: A pacer wire is visualized. There is a moderate pleural effusion in the left  lateral region. LEFT VENTRICLE PLAX 2D LVIDd:         4.90 cm LVIDs:         3.90 cm LV PW:         0.90 cm LV IVS:        1.00 cm LVOT diam:     1.80 cm LV SV:         36 LV SV Index:   18 LVOT Area:     2.54 cm  RIGHT VENTRICLE RV S prime:     7.83 cm/s TAPSE (M-mode): 1.6 cm LEFT ATRIUM         Index LA diam:    4.70 cm 2.42 cm/m  AORTIC VALVE LVOT Vmax:   69.80 cm/s LVOT Vmean:  54.100 cm/s LVOT VTI:    0.141 m MITRAL VALVE             TRICUSPID VALVE MV Area (PHT): 1.59 cm  TR Peak grad:   30.5 mmHg MV Peak grad:  10.8 mmHg TR Vmax:        276.00 cm/s MV Mean grad:  4.0 mmHg MV Vmax:       1.64 m/s  SHUNTS MV Vmean:      88.3 cm/s Systemic VTI:  0.14 m                          Systemic Diam: 1.80 cm Lyman Bishop MD Electronically signed by Lyman Bishop MD Signature Date/Time: 07/09/2020/10:04:57 AM    Final         Scheduled Meds: . (feeding supplement) PROSource Plus  30 mL Oral BID BM  . acetaminophen  1,000 mg Oral Q6H  . calcium-vitamin D  1 tablet Oral TID  . Chlorhexidine Gluconate Cloth  6 each Topical Daily  . feeding supplement  1 Container Oral TID BM  . midodrine  5 mg Oral TID WC  . multivitamin with minerals  1 tablet Oral Daily  . pantoprazole  40 mg Oral Daily  . sodium bicarbonate  650 mg Oral BID  . sodium chloride flush  3 mL Intravenous Q12H  . torsemide  20 mg Oral Daily   Continuous Infusions: . [START ON 07/10/2020] cefTRIAXone (ROCEPHIN)  IV       LOS: 28 days    Time spent: 35 minutes    Irine Seal, MD [facility]   To contact the attending provider between 7A-7P or the covering provider during after hours 7P-7A, please log into the web site [email or web address] and access using universal [place] password for that web site. If you do not have the password, please call the hospital  operator.  07/09/2020, 12:23 PM

## 2020-07-09 NOTE — Progress Notes (Signed)
17 Days Post-Op    CC: abdominal pain   Subjective: She is sitting up and not sure what the new plan is .  I told her she was scheduled for a procedure and she isn't really aware of it. Wound vac in place, will look at it tomorrow when it is change.    Objective: Vital signs in last 24 hours: Temp:  [97.4 F (36.3 C)-97.6 F (36.4 C)] 97.4 F (36.3 C) (08/23 0600) Pulse Rate:  [68-71] 68 (08/23 0600) Resp:  [18-20] 20 (08/23 0600) BP: (90-102)/(42-53) 90/46 (08/23 0600) SpO2:  [96 %-99 %] 97 % (08/23 0600) Last BM Date: 07/08/20 120 PO recorded Urine 450 recorded Stool x 2 recorded Afebrile, VSS Creatinine up to 1.87 H/H 7.3/25.7   Intake/Output from previous day: 08/22 0701 - 08/23 0700 In: 120 [P.O.:120] Out: 450 [Urine:450] Intake/Output this shift: No intake/output data recorded.  General appearance: alert, cooperative and no distress Resp: clear to auscultation bilaterally GI: Soft no significant tenderness.  Tolerating diet well.  Wound VAC in place.  Lab Results:  Recent Labs    07/08/20 0531 07/09/20 0304  HGB 7.3* 7.3*  HCT 25.5* 25.7*    BMET Recent Labs    07/08/20 0533 07/09/20 0304  NA 145 147*  K 3.8 3.9  CL 117* 118*  CO2 20* 20*  GLUCOSE 85 79  BUN 33* 43*  CREATININE 1.61* 1.87*  CALCIUM 7.3* 7.5*   PT/INR No results for input(s): LABPROT, INR in the last 72 hours.  Recent Labs  Lab 07/04/20 0310 07/05/20 0415 07/06/20 0500 07/07/20 1451 07/08/20 0533  AST 14* 15 16  --   --   ALT 9 9 10   --   --   ALKPHOS 91 92 97  --   --   BILITOT 0.5 0.6 0.6  --   --   PROT 4.5* 4.5* 4.6*  --   --   ALBUMIN 1.9* 1.9* 2.0* 2.2* 2.4*     Lipase     Component Value Date/Time   LIPASE 27 06/11/2020 0123     Medications: . (feeding supplement) PROSource Plus  30 mL Oral BID BM  . acetaminophen  1,000 mg Oral Q6H  . calcium-vitamin D  1 tablet Oral TID  . Chlorhexidine Gluconate Cloth  6 each Topical Daily  . feeding supplement   1 Container Oral TID BM  . midodrine  5 mg Oral TID WC  . multivitamin with minerals  1 tablet Oral Daily  . pantoprazole  40 mg Oral Daily  . sodium bicarbonate  650 mg Oral BID  . sodium chloride flush  3 mL Intravenous Q12H  . torsemide  20 mg Oral Daily    Assessment/Plan SIRS secondary to UTI/GI bleed Tachy-Brady syndrome - PTVP 11/2014 Severe MR s/p repair with 28 mm annuloplasty ring/oversew LAA, 2003 Hx NSVT/chronic atrial fibrillation-not on anticoagulation secondary to GI bleed Hx CHF Hx pulmonary hypertension Anemia -H/H 7.6/26.3 Hx CKD with AKI  Hx gastritis Hx recent balloon dilatation [name] Hx spinal stenosis Hx hypertension/hypertension-on midodrine at home Hydronephrosis with cystoscopy/left retrograde pyelogram/insertion of left double-J stent 06/11/2020;Dr. 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 Michail Sermon >>path: adenocarcinoma - CT chest with possible metastatic lesion in the right lung apex  - [id number] (8/3) - S/p Lap Right Hemicolectomy 8/5 Dr Doloris Hall #[id number] - Path: Invasive moderately differentiated mucinous adenocarcinoma. - Med  Onc has seen here in the hospital and reports they have arranged outpt follow up -Postop ileus resolved; tolerating soft diet and having bowel function -Wound open 8/16>> CT scan shows nosigns of dehiscence -Wound VAC placement midline incision 07/03/2020 - Mobilize, Pulm toilet  - PT/OT, recommending SNF  HEN:IDPO diet ID: No further abx indicated from a general surgery standpoint. Zosyn 8/8-8/12 for PNA, TRHAugmentin 8/12-8/17 for UTI  Plan:  She is ready for discharge from our standpoint.  She has follow up in the AVS.  I will check her wound tomorrow with wound vac change.        LOS: 28 days    [name] 07/09/2020 Please see Amion

## 2020-07-09 NOTE — Progress Notes (Signed)
  Echocardiogram 2D Echocardiogram has been performed.  Oneal Deputy Dvid Pendry 07/09/2020, 8:57 AM

## 2020-07-09 NOTE — Progress Notes (Signed)
Progress Note  Patient Name: [name] Date of Encounter: 07/09/2020  Primary Cardiologist: [name], MD   Subjective   Feeing ok today with no specific complaints.   Inpatient Medications    Scheduled Meds: . (feeding supplement) PROSource Plus  30 mL Oral BID BM  . acetaminophen  1,000 mg Oral Q6H  . calcium-vitamin D  1 tablet Oral TID  . Chlorhexidine Gluconate Cloth  6 each Topical Daily  . feeding supplement  1 Container Oral TID BM  . midodrine  5 mg Oral TID WC  . multivitamin with minerals  1 tablet Oral Daily  . pantoprazole  40 mg Oral Daily  . sodium bicarbonate  650 mg Oral BID  . sodium chloride flush  3 mL Intravenous Q12H  . torsemide  20 mg Oral Daily   Continuous Infusions: . [START ON 07/10/2020] cefTRIAXone (ROCEPHIN)  IV     PRN Meds: HYDROmorphone (DILAUDID) injection, levalbuterol, metoprolol tartrate, ondansetron (ZOFRAN) IV, oxyCODONE, sodium chloride flush   Vital Signs    Vitals:   07/08/20 1232 07/08/20 1956 07/09/20 0600 07/09/20 1116  BP: (!) 92/42 (!) 102/53 (!) 90/46 (!) 89/43  Pulse: 69 71 68 70  Resp: 18 20 20 18   Temp: 97.6 F (36.4 C) (!) 97.5 F (36.4 C) (!) 97.4 F (36.3 C)   TempSrc: Oral Oral Oral   SpO2: 99% 96% 97% 98%  Weight:      Height:        Intake/Output Summary (Last 24 hours) at 07/09/2020 1222 Last data filed at 07/09/2020 1000 Gross per 24 hour  Intake 120 ml  Output 450 ml  Net -330 ml   Filed Weights   07/06/20 0538 07/07/20 0500 07/08/20 0554  Weight: 94.2 kg 93.6 kg 94.4 kg    Physical Exam   General: Elderly, NAD Neck: Negative for carotid bruits. No JVD Lungs: + bilaterally wheezes, no rales, or rhonchi. Breathing is unlabored. Cardiovascular: Irregularly irregular with S1 S2. + murmurs Extremities: Mild 1+ edema. Radial pulses 2+ bilaterally Neuro: Alert and oriented. No focal deficits. No facial asymmetry. MAE spontaneously. Psych: Responds to questions appropriately with  normal affect.    Labs    Chemistry Recent Labs  Lab 07/04/20 0310 07/04/20 0310 07/05/20 0415 07/05/20 0415 07/06/20 0500 07/07/20 0400 07/07/20 1451 07/08/20 0533 07/09/20 0304  NA 141   < > 146*   < > 146*   < > 145 145 147*  K 3.1*   < > 4.0   < > 3.5   < > 4.0 3.8 3.9  CL 118*   < > 122*   < > 119*   < > 119* 117* 118*  CO2 16*   < > 18*   < > 19*   < > 20* 20* 20*  GLUCOSE 99   < > 92   < > 94   < > 115* 85 79  BUN 24*   < > 22   < > 26*   < > 32* 33* 43*  CREATININE 1.83*   < > 1.85*   < > 1.66*   < > 1.70* 1.61* 1.87*  CALCIUM 7.0*   < > 7.2*   < > 7.3*   < > 7.1* 7.3* 7.5*  PROT 4.5*  --  4.5*  --  4.6*  --   --   --   --   ALBUMIN 1.9*   < > 1.9*   < > 2.0*  --  2.2* 2.4*  --   AST 14*  --  15  --  16  --   --   --   --   ALT 9  --  9  --  10  --   --   --   --   ALKPHOS 91  --  92  --  97  --   --   --   --   BILITOT 0.5  --  0.6  --  0.6  --   --   --   --   GFRNONAA 24*   < > 23*   < > 27*   < > 26* 28* 23*  GFRAA 27*   < > 27*   < > 31*   < > 30* 32* 27*  ANIONGAP 7   < > 6   < > 8   < > 6 8 9    < > = values in this interval not displayed.     Hematology Recent Labs  Lab 07/04/20 0310 07/04/20 0310 07/05/20 0415 07/05/20 0415 07/06/20 0500 07/06/20 0500 07/07/20 0400 07/08/20 0531 07/09/20 0304  WBC 6.8  --  7.0  --  7.0  --   --   --   --   RBC 2.83*  --  2.81*  --  2.87*  --   --   --   --   HGB 7.4*   < > 7.3*   < > 7.5*   < > 7.4* 7.3* 7.3*  HCT 26.2*   < > 26.0*   < > 26.5*   < > 26.1* 25.5* 25.7*  MCV 92.6  --  92.5  --  92.3  --   --   --   --   MCH 26.1  --  26.0  --  26.1  --   --   --   --   MCHC 28.2*  --  28.1*  --  28.3*  --   --   --   --   RDW 21.9*  --  22.5*  --  22.8*  --   --   --   --   PLT 154  --  146*  --  162  --   --   --   --    < > = values in this interval not displayed.    Cardiac EnzymesNo results for input(s): TROPONINI in the last 168 hours. No results for input(s): TROPIPOC in the last 168 hours.   BNPNo  results for input(s): BNP, PROBNP in the last 168 hours.   DDimer No results for input(s): DDIMER in the last 168 hours.   Radiology    ECHOCARDIOGRAM LIMITED  Result Date: 07/09/2020    ECHOCARDIOGRAM LIMITED REPORT   Patient Name:   [name] Date of Exam: 07/09/2020 Medical Rec #:  [id number]      Height:       62.0 in Accession #:    [id number]     Weight:       208.1 lb Date of Birth:  [birth year]     BSA:          1.944 m Patient Age:    86 years       BP:           90/46 mmHg Patient Gender: F              HR:  70 bpm. Exam Location:  Inpatient Procedure: Limited Echo, Color Doppler, Cardiac Doppler and Intracardiac            Opacification Agent Indications:    Y69.48 Acute systolic (congestive) heart failure  History:        Patient has prior history of Echocardiogram examinations, most                 recent 04/05/2020. CHF, Pacemaker, Arrythmias:Atrial                 Fibrillation; Risk Factors:Hypertension. 2003 68mm Mitral                 Annuloplasty Ring implanted.                  Mitral Valve: 28 mm prosthetic annuloplasty ring valve is                 present in the mitral position.  Sonographer:    Raquel Sarna Senior RDCS Referring Phys: [id number] Donato Heinz  Sonographer Comments: Technically challenging study due to limited acoustic windows. Limited to assess systolic function, valves, and IVC. IMPRESSIONS  1. Left ventricular ejection fraction, by estimation, is 40 to 45%. The left ventricle has mildly decreased function. The left ventricle demonstrates global hypokinesis. Left ventricular diastolic function could not be evaluated.  2. Right ventricular systolic function is mildly reduced. The right ventricular size is mildly enlarged. There is moderately elevated pulmonary artery systolic pressure. The estimated right ventricular systolic pressure is 50.0 mmHg.  3. Left atrial size was severely dilated.  4. Right atrial size was severely dilated.  5. The mitral valve has  been repaired/replaced. Trivial mitral valve regurgitation. The mean mitral valve gradient is 4.0 mmHg with average heart rate of 70 bpm. There is a 28 mm prosthetic annuloplasty ring present in the mitral position.  6. The pericardial effusion is posterior to the left ventricle. Moderate pleural effusion in the left lateral region.  7. The tricuspid valve is abnormal. Tricuspid valve regurgitation is moderate to severe.  8. The aortic valve is tricuspid. Mild aortic valve sclerosis is present, with no evidence of aortic valve stenosis.  9. The inferior vena cava is dilated in size with <50% respiratory variability, suggesting right atrial pressure of 15 mmHg. Comparison(s): Changes from prior study are noted. 04/05/20: LVEF 40%, severe TR. FINDINGS  Left Ventricle: Left ventricular ejection fraction, by estimation, is 40 to 45%. The left ventricle has mildly decreased function. The left ventricle demonstrates global hypokinesis. Definity contrast agent was given IV to delineate the left ventricular  endocardial borders. The left ventricular internal cavity size was normal in size. There is borderline left ventricular hypertrophy. Abnormal (paradoxical) septal motion, consistent with RV pacemaker. Left ventricular diastolic function could not be evaluated due to paced rhythm. Right Ventricle: The right ventricular size is mildly enlarged. No increase in right ventricular wall thickness. Right ventricular systolic function is mildly reduced. There is moderately elevated pulmonary artery systolic pressure. The tricuspid regurgitant velocity is 2.76 m/s, and with an assumed right atrial pressure of 15 mmHg, the estimated right ventricular systolic pressure is 93.8 mmHg. Left Atrium: Left atrial size was severely dilated. Right Atrium: Right atrial size was severely dilated. Pericardium: A small pericardial effusion is present. The pericardial effusion is posterior to the left ventricle. Mitral Valve: The mitral valve  has been repaired/replaced. Trivial mitral valve regurgitation. There is a 28 mm prosthetic annuloplasty ring present in the mitral position. MV  peak gradient, 10.8 mmHg. The mean mitral valve gradient is 4.0 mmHg with average heart rate of 70 bpm. Tricuspid Valve: The tricuspid valve is abnormal. Tricuspid valve regurgitation is moderate to severe. Aortic Valve: The aortic valve is tricuspid. Mild aortic valve sclerosis is present, with no evidence of aortic valve stenosis. Pulmonic Valve: The pulmonic valve was grossly normal. Pulmonic valve regurgitation is trivial. Venous: The inferior vena cava is dilated in size with less than 50% respiratory variability, suggesting right atrial pressure of 15 mmHg. IAS/Shunts: No atrial level shunt detected by color flow Doppler. Additional Comments: A pacer wire is visualized. There is a moderate pleural effusion in the left lateral region. LEFT VENTRICLE PLAX 2D LVIDd:         4.90 cm LVIDs:         3.90 cm LV PW:         0.90 cm LV IVS:        1.00 cm LVOT diam:     1.80 cm LV SV:         36 LV SV Index:   18 LVOT Area:     2.54 cm  RIGHT VENTRICLE RV S prime:     7.83 cm/s TAPSE (M-mode): 1.6 cm LEFT ATRIUM         Index LA diam:    4.70 cm 2.42 cm/m  AORTIC VALVE LVOT Vmax:   69.80 cm/s LVOT Vmean:  54.100 cm/s LVOT VTI:    0.141 m MITRAL VALVE             TRICUSPID VALVE MV Area (PHT): 1.59 cm  TR Peak grad:   30.5 mmHg MV Peak grad:  10.8 mmHg TR Vmax:        276.00 cm/s MV Mean grad:  4.0 mmHg MV Vmax:       1.64 m/s  SHUNTS MV Vmean:      88.3 cm/s Systemic VTI:  0.14 m                          Systemic Diam: 1.80 cm Lyman Bishop MD Electronically signed by Lyman Bishop MD Signature Date/Time: 07/09/2020/10:04:57 AM    Final     Telemetry    07/09/20 AF rates controlled - Personally Reviewed  ECG    No new tracing as of 07/09/20- Personally Reviewed  Cardiac Studies   Echo 04/05/20:  1. Left ventricular ejection fraction, by estimation, is 40%. The  left  ventricle has mildly decreased function. The left ventricle demonstrates  global hypokinesis. The left ventricular internal cavity size was mildly  dilated. Left ventricular diastolic  parameters are indeterminate.  2. Right ventricular systolic function is moderately reduced. The right  ventricular size is moderately enlarged. The estimated right ventricular  systolic pressure is 81.0 mmHg.  3. Left atrial size was severely dilated.  4. Right atrial size was severely dilated.  5. The mitral valve has been repaired/replaced. Mild mitral valve  regurgitation. No evidence of mitral stenosis however Doppler alignment of  diastolic gradient is slightly suboptimal. The mean mitral valve gradient  is 2.0 mmHg with average heart rate of  70 bpm.  6. Tricuspid valve regurgitation is severe.  7. The aortic valve is tricuspid. Aortic valve regurgitation is mild. No  aortic stenosis is present.  8. The inferior vena cava is dilated in size with <50% respiratory  variability, suggesting right atrial pressure of 15 mmHg.   Patient Profile  86 y.o. female with a hx of chronic combined systolic/diastolic heart failure, tachybrady syndrome status post BiV PPM, severe MR status post MV repair in 2003, CKD stage III, chronic atrial fibrillation not on anticoagulation due to GI bleed, orthostatic hypotension, moderate pulmonary hypertension, severe TR who is being seen today for the evaluation of heart failure at the request of Dr. Grandville Silos.  Assessment & Plan    1. Acute on chronic combined systolic and diastolic heart failure:  -Pt has a hx of systolic dysfunction per echocardiogram from 04/05/20 with LVEF 40%, global hypokinesis, moderate RV dysfunction, mild mitral regurgitation, severe tricuspid regurgitation.   -I&O, net positive 7.8L -Weight, 208lb>>>weight continues to rise  -BNP>>pending  -Plan to re-check echocardiogram today  -Diuresis with IV lasix 40 mg BID  2. Permanent  atrial fibrillation:  -Not on anticoagulation due to GI bleed history.  -HRs well controlled on Toprol 12.5 however this was discontinued per family request due to concern over recent hypotension requiring midodrine   3. Tachybrady syndrome:  -s/p BiV PPM  4. NSVT: -No recurrence on telemetry review -Maintain K>4, Mag>2  5. AKI on CKD stage III:  -Peak creatinine 3 and stabilizing ;per labs today at 1.87 however up from 1.61 yesterday   6. 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   -Management per oncology, general surgery, and palliative care    7. Sepsis:  -Resolved, due to UTI.   -Completed course of Augmentin.  8. Obstructive uropathy:  -S/p ureteral stent 06/11/2020.  -Stent removal planned 07/10/2020  9. Hypotension:  -Chronic issue, on midodrine -BP-90/46>102/53>92/42  Signed, Kathyrn Drown NP-C [place] Pager: [phone] 07/09/2020, 12:22 PM    For questions or updates, please contact   Please consult [email or web address] for contact info under Cardiology/STEMI.  Patient seen and examined.  Agree with above documentation.  On exam, patient is alert, regular rate and rhythm, no murmurs, expiratory wheezing, 1+ bilateral lower extremity edema.  Telemetry personally reviewed and shows V paced rhythm with rate 70s.  Echo today with EF 40 to 45%, global hypokinesis, mild RV dysfunction, RVSP 45 mmHg, moderate pleural effusion, moderate to severe TR, IVC fixed/dilated.  She appears volume overloaded and respiratory status appears worse today, with expiratory wheezing.  Recommend diuresis with IV Lasix 40 mg twice daily.  Close monitoring of kidney function and BP with diuresis.  Donato Heinz, MD

## 2020-07-09 NOTE — Progress Notes (Addendum)
Pt had 8 beats run of V Tach and then it went to regular paced rhythm. MD is paged via secure chat. Assessed the Pt she is alert. VSS.

## 2020-07-09 NOTE — Progress Notes (Signed)
17 Days Post-Op  Subjective: [name] is doing well recovering from her colon surgery.  She reports no flank pain or voiding complaints but is being managed with a purewick.  Her Cr was up slightly yesterday to 1.87 and her Hgb is stable at 7.3.   She has no associated signs or symptoms.  ROS:  Review of Systems  Constitutional: Negative for chills and fever.  Respiratory: Negative for shortness of breath.   Cardiovascular: Negative for chest pain.    Anti-infectives: Anti-infectives (From admission, onward)   Start     Dose/Rate Route Frequency Ordered Stop   06/28/20 1415  amoxicillin-clavulanate (AUGMENTIN) 500-125 MG per tablet 500 mg  Status:  Discontinued        1 tablet Oral 2 times daily 06/28/20 1316 07/03/20 1553   06/24/20 2200  piperacillin-tazobactam (ZOSYN) IVPB 3.375 g  Status:  Discontinued        3.375 g 12.5 mL/hr over 240 Minutes Intravenous Every 8 hours 06/24/20 2056 06/28/20 1316   06/22/20 0911  sodium chloride 0.9 % with cefoTEtan (CEFOTAN) ADS Med       Note to Pharmacy: Georgena Spurling   : cabinet override      06/22/20 0911 06/22/20 1020   06/21/20 1115  cefoTEtan (CEFOTAN) 2 g in sodium chloride 0.9 % 100 mL IVPB        2 g 200 mL/hr over 30 Minutes Intravenous On call to O.R. 06/20/20 1346 06/22/20 0559   06/20/20 2200  amoxicillin (AMOXIL) capsule 500 mg  Status:  Discontinued        500 mg Oral Every 8 hours 06/20/20 1950 06/24/20 0725   06/14/20 1000  amoxicillin (AMOXIL) capsule 500 mg        500 mg Oral Every 12 hours 06/14/20 0741 06/19/20 0959   06/11/20 1030  cefTRIAXone (ROCEPHIN) 1 g in sodium chloride 0.9 % 100 mL IVPB  Status:  Discontinued        1 g 200 mL/hr over 30 Minutes Intravenous Every 24 hours 06/11/20 1021 06/14/20 0741   06/11/20 0300  cefTRIAXone (ROCEPHIN) 1 g in sodium chloride 0.9 % 100 mL IVPB        1 g 200 mL/hr over 30 Minutes Intravenous  Once 06/11/20 0259 06/11/20 0458      Current Facility-Administered Medications   Medication Dose Route Frequency Provider Last Rate Last Admin   (feeding supplement) PROSource Plus liquid 30 mL  30 mL Oral BID BM Eugenie Filler, MD   30 mL at 07/08/20 1639   acetaminophen (TYLENOL) tablet 1,000 mg  1,000 mg Oral Q6H Romana Juniper A, MD   1,000 mg at 07/09/20 0600   calcium-vitamin D (OSCAL WITH D) 500-200 MG-UNIT per tablet 1 tablet  1 tablet Oral TID Eugenie Filler, MD   1 tablet at 07/08/20 2137   Chlorhexidine Gluconate Cloth 2 % PADS 6 each  6 each Topical Daily Eugenie Filler, MD   6 each at 07/08/20 1000   feeding supplement (BOOST / RESOURCE BREEZE) liquid 1 Container  1 Container Oral TID BM Jovita Kussmaul, MD   1 Container at 07/08/20 1957   HYDROmorphone (DILAUDID) injection 0.5-1 mg  0.5-1 mg Intravenous Q1H PRN Autumn Messing III, MD   1 mg at 06/25/20 0851   levalbuterol (XOPENEX) nebulizer solution 0.63 mg  0.63 mg Nebulization Q6H PRN Eugenie Filler, MD       metoprolol tartrate (LOPRESSOR) injection 5 mg  5 mg Intravenous Q6H  PRN Autumn Messing III, MD       midodrine (PROAMATINE) tablet 5 mg  5 mg Oral TID WC Autumn Messing III, MD   5 mg at 07/08/20 1639   multivitamin with minerals tablet 1 tablet  1 tablet Oral Daily Eugenie Filler, MD   1 tablet at 07/08/20 0922   ondansetron [facility]) injection 4 mg  4 mg Intravenous Q4H PRN Romana Juniper A, MD   4 mg at 07/08/20 5852   oxyCODONE (Oxy IR/ROXICODONE) immediate release tablet 2.5 mg  2.5 mg Oral Q4H PRN Autumn Messing III, MD   2.5 mg at 06/27/20 2224   pantoprazole (PROTONIX) EC tablet 40 mg  40 mg Oral Daily Eugenie Filler, MD   40 mg at 07/08/20 7782   sodium bicarbonate tablet 650 mg  650 mg Oral BID Autumn Messing III, MD   650 mg at 07/08/20 2137   sodium chloride flush (NS) 0.9 % injection 10-40 mL  10-40 mL Intracatheter PRN Eugenie Filler, MD   10 mL at 06/25/20 1754   sodium chloride flush (NS) 0.9 % injection 3 mL  3 mL Intravenous Q12H Autumn Messing III, MD   3 mL at  07/08/20 2138   torsemide (DEMADEX) tablet 20 mg  20 mg Oral Daily Eugenie Filler, MD   20 mg at 07/08/20 4235     Objective: Vital signs in last 24 hours: Temp:  [97.4 F (36.3 C)-97.6 F (36.4 C)] 97.4 F (36.3 C) (08/23 0600) Pulse Rate:  [68-71] 68 (08/23 0600) Resp:  [18-20] 20 (08/23 0600) BP: (90-102)/(42-55) 90/46 (08/23 0600) SpO2:  [96 %-99 %] 97 % (08/23 0600)  Intake/Output from previous day: 08/22 0701 - 08/23 0700 In: 120 [P.O.:120] Out: 450 [Urine:450] Intake/Output this shift: No intake/output data recorded.   Physical Exam Vitals reviewed.  Constitutional:      Appearance: Normal appearance.  Cardiovascular:     Rate and Rhythm: Normal rate and regular rhythm.  Pulmonary:     Effort: Pulmonary effort is normal. No respiratory distress.  Musculoskeletal:     Right lower leg: No edema.     Left lower leg: No edema.  Neurological:     Mental Status: She is alert.     Lab Results:  Recent Labs    07/08/20 0531 07/09/20 0304  HGB 7.3* 7.3*  HCT 25.5* 25.7*   BMET Recent Labs    07/08/20 0533 07/09/20 0304  NA 145 147*  K 3.8 3.9  CL 117* 118*  CO2 20* 20*  GLUCOSE 85 79  BUN 33* 43*  CREATININE 1.61* 1.87*  CALCIUM 7.3* 7.5*   PT/INR No results for input(s): LABPROT, INR in the last 72 hours. ABG No results for input(s): PHART, HCO3 in the last 72 hours.  Invalid input(s): PCO2, PO2  Studies/Results: Urine culture was negative on 8/16.     Assessment and Plan: Multiple left ureteral stones.   We will proceed with left ureteroscopy on 8/24 as planned.   Preop orders placed.         LOS: 28 days    Irine Seal 07/09/2020 [phone] ID: Bennetta Laos, female   DOB: 05-15-1926, 86 y.o.   MRN: [id number]

## 2020-07-09 NOTE — Progress Notes (Signed)
Physical Therapy Treatment Patient Details Name: [name] MRN: [id number] DOB: [birth year] Today's Date: 07/09/2020    History of Present Illness 86 yo female admitted with UTI SIRS. S/P cystoscopy, L double J stent placement 06/11/20. S/P R colectomy 06/22/20. Hx of Afib, CKD, anemia, CHF, NSVT, pacemaker, chronic back pain, spinal stenosis, orthostatic hypotension. Wound vac placed 8/17    PT Comments    Pt continues to participate well. Pt still gets dyspneic with minimal activity. Continue to recommend SNF.    Follow Up Recommendations  SNF     Equipment Recommendations  None recommended by PT    Recommendations for Other Services       Precautions / Restrictions Precautions Precautions: Fall Precaution Comments: loose stools, wound vac Restrictions Weight Bearing Restrictions: No    Mobility  Bed Mobility Overal bed mobility: Needs Assistance Bed Mobility: Supine to Sit     Supine to sit: Mod assist;HOB elevated     General bed mobility comments: Assist for trunk and LEs. Utilized bedpad to aid with scooting. Increased time.  Transfers Overall transfer level: Needs assistance Equipment used: Rolling walker (2 wheeled) Transfers: Sit to/from Stand Sit to Stand: Min assist Stand pivot transfers: Min assist       General transfer comment: Assist to rise, stabilize, control descent. Cues for safety, technique. Stand pivot, bed to recliner, with RW  Ambulation/Gait Min Assist Gait Distance (Feet): 3 Feet Assistive device: Rolling walker (2 wheeled)       General Gait Details: Ambulation distance limited by dyspnea-3/4.   Stairs             Wheelchair Mobility    Modified Rankin (Stroke Patients Only)       Balance Overall balance assessment: Needs assistance         Standing balance support: Bilateral upper extremity supported Standing balance-Leahy Scale: Poor                              Cognition  Arousal/Alertness: Awake/alert Behavior During Therapy: WFL for tasks assessed/performed Overall Cognitive Status: History of cognitive impairments - at baseline                                        Exercises      General Comments        Pertinent Vitals/Pain Pain Assessment: No/denies pain    Home Living                      Prior Function            PT Goals (current goals can now be found in the care plan section) Progress towards PT goals: Progressing toward goals    Frequency    Min 2X/week      PT Plan Current plan remains appropriate    Co-evaluation              AM-PAC PT "6 Clicks" Mobility   Outcome Measure  Help needed turning from your back to your side while in a flat bed without using bedrails?: A Lot Help needed moving from lying on your back to sitting on the side of a flat bed without using bedrails?: A Lot Help needed moving to and from a bed to a chair (including a wheelchair)?: A Little Help needed standing up from a chair  using your arms (e.g., wheelchair or bedside chair)?: A Little Help needed to walk in hospital room?: A Lot Help needed climbing 3-5 steps with a railing? : Total 6 Click Score: 13    End of Session   Activity Tolerance: Patient limited by fatigue Patient left: in chair;with call bell/phone within reach;with chair alarm set   PT Visit Diagnosis: Difficulty in walking, not elsewhere classified (R26.2);Muscle weakness (generalized) (M62.81)     Time: 3668-1594 PT Time Calculation (min) (ACUTE ONLY): 13 min  Charges:  $Gait Training: 8-22 mins                         Doreatha Massed, PT Acute Rehabilitation  Office: [phone] Pager: [phone]

## 2020-07-09 NOTE — Progress Notes (Signed)
Received call back from On Call Cardiologist, new orders received.

## 2020-07-09 NOTE — Progress Notes (Signed)
On call Cardiology [facility] is paged via [place].

## 2020-07-09 NOTE — Progress Notes (Signed)
Paged that patient had 8 beats of NSVT then went back to a paced rhythm. She is asymptomatic.  I ordered a BMP and Mg and asked nursing to page back if Mg or K abnormal.

## 2020-07-10 ENCOUNTER — Encounter (HOSPITAL_COMMUNITY): Payer: Self-pay | Admitting: Internal Medicine

## 2020-07-10 ENCOUNTER — Inpatient Hospital Stay (HOSPITAL_COMMUNITY): Payer: Medicare Other

## 2020-07-10 ENCOUNTER — Inpatient Hospital Stay (HOSPITAL_COMMUNITY): Admission: RE | Admit: 2020-07-10 | Payer: Medicare Other | Source: Home / Self Care | Admitting: Urology

## 2020-07-10 ENCOUNTER — Inpatient Hospital Stay (HOSPITAL_COMMUNITY): Payer: Medicare Other | Admitting: Certified Registered"

## 2020-07-10 ENCOUNTER — Encounter (HOSPITAL_COMMUNITY)
Admission: EM | Disposition: A | Discharge status: Skilled Nursing Facility | Payer: Self-pay | Source: Home / Self Care | Attending: Internal Medicine

## 2020-07-10 HISTORY — PX: CYSTOSCOPY/URETEROSCOPY/HOLMIUM LASER/STENT PLACEMENT: SHX6546

## 2020-07-10 LAB — CBC WITH DIFFERENTIAL/PLATELET
Abs Immature Granulocytes: 0.02 10*3/uL (ref 0.00–0.07)
Basophils Absolute: 0 10*3/uL (ref 0.0–0.1)
Basophils Relative: 0 %
Eosinophils Absolute: 0.2 10*3/uL (ref 0.0–0.5)
Eosinophils Relative: 3 %
HCT: 25.2 % — ABNORMAL LOW (ref 36.0–46.0)
Hemoglobin: 7.3 g/dL — ABNORMAL LOW (ref 12.0–15.0)
Immature Granulocytes: 0 %
Lymphocytes Relative: 19 %
Lymphs Abs: 1.4 10*3/uL (ref 0.7–4.0)
MCH: 26.6 pg (ref 26.0–34.0)
MCHC: 29 g/dL — ABNORMAL LOW (ref 30.0–36.0)
MCV: 92 fL (ref 80.0–100.0)
Monocytes Absolute: 0.6 10*3/uL (ref 0.1–1.0)
Monocytes Relative: 8 %
Neutro Abs: 4.8 10*3/uL (ref 1.7–7.7)
Neutrophils Relative %: 70 %
Platelets: 147 10*3/uL — ABNORMAL LOW (ref 150–400)
RBC: 2.74 MIL/uL — ABNORMAL LOW (ref 3.87–5.11)
RDW: 24 % — ABNORMAL HIGH (ref 11.5–15.5)
WBC: 7 10*3/uL (ref 4.0–10.5)
nRBC: 0 % (ref 0.0–0.2)

## 2020-07-10 LAB — BASIC METABOLIC PANEL
Anion gap: 7 (ref 5–15)
BUN: 43 mg/dL — ABNORMAL HIGH (ref 8–23)
CO2: 20 mmol/L — ABNORMAL LOW (ref 22–32)
Calcium: 7.1 mg/dL — ABNORMAL LOW (ref 8.9–10.3)
Chloride: 115 mmol/L — ABNORMAL HIGH (ref 98–111)
Creatinine, Ser: 1.81 mg/dL — ABNORMAL HIGH (ref 0.44–1.00)
GFR calc Af Amer: 28 mL/min — ABNORMAL LOW (ref >60)
GFR calc non Af Amer: 24 mL/min — ABNORMAL LOW (ref >60)
Glucose, Bld: 80 mg/dL (ref 70–99)
Potassium: 3.7 mmol/L (ref 3.5–5.1)
Sodium: 142 mmol/L (ref 135–145)

## 2020-07-10 LAB — MAGNESIUM: Magnesium: 2.2 mg/dL (ref 1.7–2.4)

## 2020-07-10 SURGERY — CYSTOSCOPY/URETEROSCOPY/HOLMIUM LASER/STENT PLACEMENT
Anesthesia: General | Laterality: Left

## 2020-07-10 MED ORDER — PROPOFOL 10 MG/ML IV BOLUS
INTRAVENOUS | Status: DC | PRN
  Administered 2020-07-10: 100 mg via INTRAVENOUS

## 2020-07-10 MED ORDER — SUCCINYLCHOLINE CHLORIDE 200 MG/10ML IV SOSY
PREFILLED_SYRINGE | INTRAVENOUS | Status: DC | PRN
  Administered 2020-07-10: 120 mg via INTRAVENOUS

## 2020-07-10 MED ORDER — LIDOCAINE 2% (20 MG/ML) 5 ML SYRINGE
INTRAMUSCULAR | Status: AC
  Filled 2020-07-10: qty 5

## 2020-07-10 MED ORDER — ACETAMINOPHEN 500 MG PO TABS: 1000.0000 mg | ORAL_TABLET | Freq: Once | ORAL | Status: DC

## 2020-07-10 MED ORDER — ACETAMINOPHEN 500 MG PO TABS
1000.0000 mg | ORAL_TABLET | Freq: Four times a day (QID) | ORAL | Status: DC
  Administered 2020-07-10 – 2020-07-14 (×13): 1000 mg via ORAL
  Filled 2020-07-10 (×14): qty 2

## 2020-07-10 MED ORDER — FENTANYL CITRATE (PF) 100 MCG/2ML IJ SOLN
INTRAMUSCULAR | Status: DC | PRN
Start: 2020-07-10 — End: 2020-07-10
  Administered 2020-07-10: 25 ug via INTRAVENOUS

## 2020-07-10 MED ORDER — ONDANSETRON HCL 4 MG/2ML IJ SOLN
INTRAMUSCULAR | Status: DC | PRN
  Administered 2020-07-10: 4 mg via INTRAVENOUS

## 2020-07-10 MED ORDER — POTASSIUM CHLORIDE 20 MEQ PO PACK
20.0000 meq | PACK | Freq: Once | ORAL | Status: AC
  Administered 2020-07-10: 20 meq via ORAL
  Filled 2020-07-10: qty 1

## 2020-07-10 MED ORDER — LACTATED RINGERS IV SOLN: INTRAVENOUS | Status: DC | PRN

## 2020-07-10 MED ORDER — PROPOFOL 10 MG/ML IV BOLUS
INTRAVENOUS | Status: AC
  Filled 2020-07-10: qty 20

## 2020-07-10 MED ORDER — SODIUM CHLORIDE 0.9 % IV SOLN: 2.0000 g | INTRAVENOUS | Status: DC

## 2020-07-10 MED ORDER — FENTANYL CITRATE (PF) 100 MCG/2ML IJ SOLN
INTRAMUSCULAR | Status: AC
  Filled 2020-07-10: qty 2

## 2020-07-10 MED ORDER — PHENYLEPHRINE HCL-NACL 10-0.9 MG/250ML-% IV SOLN
INTRAVENOUS | Status: DC | PRN
  Administered 2020-07-10: 50 ug/min via INTRAVENOUS

## 2020-07-10 MED ORDER — PHENYLEPHRINE 40 MCG/ML (10ML) SYRINGE FOR IV PUSH (FOR BLOOD PRESSURE SUPPORT)
PREFILLED_SYRINGE | INTRAVENOUS | Status: AC
  Filled 2020-07-10: qty 10

## 2020-07-10 MED ORDER — FENTANYL CITRATE (PF) 100 MCG/2ML IJ SOLN: 25.0000 ug | INTRAMUSCULAR | Status: DC | PRN

## 2020-07-10 MED ORDER — SODIUM CHLORIDE 0.9 % IR SOLN
Status: DC | PRN
  Administered 2020-07-10: 6000 mL

## 2020-07-10 MED ORDER — STERILE WATER FOR IRRIGATION IR SOLN
Status: DC | PRN
  Administered 2020-07-10: 250 mL

## 2020-07-10 MED ORDER — PHENYLEPHRINE 40 MCG/ML (10ML) SYRINGE FOR IV PUSH (FOR BLOOD PRESSURE SUPPORT)
PREFILLED_SYRINGE | INTRAVENOUS | Status: DC | PRN
  Administered 2020-07-10: 160 ug via INTRAVENOUS
  Administered 2020-07-10: 120 ug via INTRAVENOUS

## 2020-07-10 MED ORDER — ONDANSETRON HCL 4 MG/2ML IJ SOLN: 4.0000 mg | Freq: Once | INTRAMUSCULAR | Status: DC | PRN

## 2020-07-10 MED ORDER — ONDANSETRON HCL 4 MG/2ML IJ SOLN
INTRAMUSCULAR | Status: AC
  Filled 2020-07-10: qty 2

## 2020-07-10 MED ORDER — PHENYLEPHRINE HCL (PRESSORS) 10 MG/ML IV SOLN
INTRAVENOUS | Status: AC
  Filled 2020-07-10: qty 1

## 2020-07-10 SURGICAL SUPPLY — 23 items
BAG URO CATCHER STRL LF (MISCELLANEOUS) ×3 IMPLANT
BASKET STONE NCOMPASS (UROLOGICAL SUPPLIES) IMPLANT
CATH URET 5FR 28IN OPEN ENDED (CATHETERS) IMPLANT
CATH URET DUAL LUMEN 6-10FR 50 (CATHETERS) IMPLANT
CLOTH BEACON ORANGE TIMEOUT ST (SAFETY) ×3 IMPLANT
EXTRACTOR STONE NITINOL NGAGE (UROLOGICAL SUPPLIES) ×3 IMPLANT
GLOVE SURG SS PI 8.0 STRL IVOR (GLOVE) ×3 IMPLANT
GOWN STRL REUS W/TWL XL LVL3 (GOWN DISPOSABLE) ×3 IMPLANT
GUIDEWIRE STR DUAL SENSOR (WIRE) ×3 IMPLANT
IV NS IRRIG 3000ML ARTHROMATIC (IV SOLUTION) ×3 IMPLANT
KIT TURNOVER KIT A (KITS) IMPLANT
LASER FIB FLEXIVA PULSE ID 365 (Laser) IMPLANT
LASER FIB FLEXIVA PULSE ID 550 (Laser) IMPLANT
LASER FIB FLEXIVA PULSE ID 910 (Laser) IMPLANT
MANIFOLD NEPTUNE II (INSTRUMENTS) ×3 IMPLANT
PACK CYSTO (CUSTOM PROCEDURE TRAY) ×3 IMPLANT
SHEATH URETERAL 12FRX35CM (MISCELLANEOUS) ×3 IMPLANT
STENT URET 6FRX24 CONTOUR (STENTS) ×3 IMPLANT
TRACTIP FLEXIVA PULS ID 200XHI (Laser) ×1 IMPLANT
TRACTIP FLEXIVA PULSE ID 200 (Laser) ×3
TUBING CONNECTING 10 (TUBING) ×2 IMPLANT
TUBING CONNECTING 10' (TUBING) ×1
TUBING UROLOGY SET (TUBING) ×3 IMPLANT

## 2020-07-10 NOTE — Op Note (Signed)
Procedure: 1.  Cystoscopy with left ureteral stent removal. 2.  Left ureteroscopic stone extraction with holmium laser application and insertion of left double-J stent. 3.  Application of fluoroscopy.  Preop diagnosis: Left ureteral and renal stones.  Postop diagnosis: Same.  Surgeon: Dr. Irine Seal.  Anesthesia: General.  Specimen: Stone fragments.  Drain: 6 [place] by 22 cm left contour double-J stent with tether.  EBL: 2 mL.  Complications: None.  Indications: The patient is a 86 year old female who was admitted several weeks ago with abdominal pain.  She was found to have left mid ureteral and left UPJ stones with obstruction but she was also found to have a cecal mass with impending obstruction so she initially underwent placement of a left double-J stent and then had resection of her cecal mass.  She is now recovered sufficiently for ureteroscopy.  Procedure: She was given Rocephin.  A general anesthetic was induced.  She was placed in lithotomy position and fitted with PAS hose.  Her perineum and genitalia were prepped with Betadine solution she was draped in usual sterile fashion.  Cystoscopy was performed using a 23 [place] scope and 30 degree lens.  The stent was grasped and pulled the urethral meatus and a guidewire was then passed the kidney.  A 6.5 French dual-lumen semirigid ureteroscope was then inserted alongside the wire and the 7 x 4 mm left mid ureteral stone was identified.  It was felt to be too large to remove intact so the 240 m holmium laser fiber was passed and the laser was set to 0.6 J and 10 Hz.  The stone was then broken into manageable fragments which were removed with the engage basket.  Once the mid ureteral stone had been removed, a 35 cm 12/14 French digital access sheath was advanced over the wire to the proximal ureter without difficulty.  Inner core and wire were then removed.  The dual-lumen digital flexible ureteroscope was then advanced the kidney.   The previous [place] positioned UPJ stone was noted in the upper pole along with a few other small stones.  The stones were engaged with the laser with a setting of 0.3 J and 20 Hz and were broken into manageable fragments which were then removed using the engage basket.  Once all significant stones were removed leaving only grit and very small round stones that could not be removed with the engage basket, the ureteroscope was removed under direct vision after placement of the wire.  The sheath was removed just the ureteroscope was withdrawn.  No significant ureteral injury was identified.  The cystoscope was then inserted over the wire and a 6 [place] by 24 cm double-J stent was advanced the kidney but proved to be too long for her collecting system so we converted to a 22 cm stent which was successfully placed.  The wire was removed, leaving good coil in the kidney and a good coil in the bladder.  The bladder was drained and the cystoscope was removed leaving the stent string exiting the urethra.  The string was tied close to the meatus, trimmed to an appropriate length.  She was taken down from the lithotomy position, the anesthetic was reversed and she was moved to the PACU in stable condition.  Stones were sent for analysis.

## 2020-07-10 NOTE — Plan of Care (Signed)
  Problem: Education: Goal: Knowledge of General Education information will improve Description: Including pain rating scale, medication(s)/side effects and non-pharmacologic comfort measures Outcome: Progressing   Problem: Health Behavior/Discharge Planning: Goal: Ability to manage health-related needs will improve Outcome: Progressing   Problem: Clinical Measurements: Goal: Ability to maintain clinical measurements within normal limits will improve Outcome: Progressing Goal: Will remain free from infection Outcome: Progressing Goal: Diagnostic test results will improve Outcome: Progressing Goal: Respiratory complications will improve Outcome: Progressing Goal: Cardiovascular complication will be avoided Outcome: Progressing   Problem: Activity: Goal: Risk for activity intolerance will decrease Outcome: Progressing   Problem: Nutrition: Goal: Adequate nutrition will be maintained Outcome: Progressing   Problem: Coping: Goal: Level of anxiety will decrease Outcome: Progressing   Problem: Elimination: Goal: Will not experience complications related to bowel motility Outcome: Progressing Goal: Will not experience complications related to urinary retention Outcome: Progressing   Problem: Safety: Goal: Ability to remain free from injury will improve Outcome: Progressing   Problem: Skin Integrity: Goal: Risk for impaired skin integrity will decrease Outcome: Progressing   Problem: Clinical Measurements: Goal: Ability to maintain clinical measurements within normal limits will improve Outcome: Progressing Goal: Postoperative complications will be avoided or minimized Outcome: Progressing   Problem: Skin Integrity: Goal: Demonstration of wound healing without infection will improve Outcome: Progressing

## 2020-07-10 NOTE — Progress Notes (Signed)
Progress Note  Patient Name: [name] Date of Encounter: 07/10/2020  Primary Cardiologist: [name], MD   Subjective   Underwent ureteral stent removal today.  Developed some wheezing/hypoxia after procedure, given IV Lasix 40 mg in PACU.  I/Os not recorded yesterday, but weight down 2 pounds with IV diuresis yesterday.  Stable creatinine (1.7 > 1.1), improvement in hyponatremia (147 > 142).  Inpatient Medications    Scheduled Meds:  (feeding supplement) PROSource Plus  30 mL Oral BID BM   acetaminophen  1,000 mg Oral Q6H   calcium-vitamin D  1 tablet Oral TID   Chlorhexidine Gluconate Cloth  6 each Topical Daily   feeding supplement  1 Container Oral TID BM   furosemide  40 mg Intravenous BID   midodrine  5 mg Oral TID WC   multivitamin with minerals  1 tablet Oral Daily   pantoprazole  40 mg Oral Daily   sodium bicarbonate  650 mg Oral BID   sodium chloride flush  3 mL Intravenous Q12H   torsemide  20 mg Oral Daily   Continuous Infusions:  PRN Meds: HYDROmorphone (DILAUDID) injection, levalbuterol, metoprolol tartrate, ondansetron (ZOFRAN) IV, oxyCODONE, sodium chloride flush   Vital Signs    Vitals:   07/10/20 1400 07/10/20 1415 07/10/20 1430 07/10/20 1442  BP: (!) 89/53 (!) 102/45 (!) 100/52 (!) 103/43  Pulse: 69 70 75 71  Resp: (!) 23 15 (!) 26 20  Temp: (!) 97.4 F (36.3 C)     TempSrc:      SpO2: 100% 100% 100% 100%  Weight:      Height:        Intake/Output Summary (Last 24 hours) at 07/10/2020 1748 Last data filed at 07/10/2020 1400 Gross per 24 hour  Intake 940 ml  Output 800 ml  Net 140 ml   Filed Weights   07/07/20 0500 07/08/20 0554 07/10/20 0619  Weight: 93.6 kg 94.4 kg 93.7 kg    Physical Exam   General: Elderly, NAD Neck: unable to assess JVD, RIJ CVC Lungs:expiratory wheezing Cardiovascular: RRR, 2/6 systolic murmur Extremities: Mild 1+ edema.  Neuro: Alert and oriented. No focal deficits. No facial  asymmetry. MAE spontaneously. Psych: Responds to questions appropriately with normal affect.    Labs    Chemistry Recent Labs  Lab 07/04/20 0310 07/04/20 0310 07/05/20 0415 07/05/20 0415 07/06/20 0500 07/07/20 0400 07/07/20 1451 07/07/20 1451 07/08/20 0533 07/08/20 0533 07/09/20 0304 07/09/20 2041 07/10/20 0405  NA 141   < > 146*   < > 146*   < > 145   < > 145   < > 147* 146* 142  K 3.1*   < > 4.0   < > 3.5   < > 4.0   < > 3.8   < > 3.9 4.0 3.7  CL 118*   < > 122*   < > 119*   < > 119*   < > 117*   < > 118* 116* 115*  CO2 16*   < > 18*   < > 19*   < > 20*   < > 20*   < > 20* 20* 20*  GLUCOSE 99   < > 92   < > 94   < > 115*   < > 85   < > 79 84 80  BUN 24*   < > 22   < > 26*   < > 32*   < > 33*   < >  43* 45* 43*  CREATININE 1.83*   < > 1.85*   < > 1.66*   < > 1.70*   < > 1.61*   < > 1.87* 2.02* 1.81*  CALCIUM 7.0*   < > 7.2*   < > 7.3*   < > 7.1*   < > 7.3*   < > 7.5* 7.6* 7.1*  PROT 4.5*  --  4.5*  --  4.6*  --   --   --   --   --   --   --   --   ALBUMIN 1.9*   < > 1.9*   < > 2.0*  --  2.2*  --  2.4*  --   --   --   --   AST 14*  --  15  --  16  --   --   --   --   --   --   --   --   ALT 9  --  9  --  10  --   --   --   --   --   --   --   --   ALKPHOS 91  --  92  --  97  --   --   --   --   --   --   --   --   BILITOT 0.5  --  0.6  --  0.6  --   --   --   --   --   --   --   --   GFRNONAA 24*   < > 23*   < > 27*   < > 26*   < > 28*   < > 23* 21* 24*  GFRAA 27*   < > 27*   < > 31*   < > 30*   < > 32*   < > 27* 24* 28*  ANIONGAP 7   < > 6   < > 8   < > 6   < > 8   < > 9 10 7    < > = values in this interval not displayed.     Hematology Recent Labs  Lab 07/05/20 0415 07/05/20 0415 07/06/20 0500 07/07/20 0400 07/08/20 0531 07/09/20 0304 07/10/20 0405  WBC 7.0  --  7.0  --   --   --  7.0  RBC 2.81*  --  2.87*  --   --   --  2.74*  HGB 7.3*   < > 7.5*   < > 7.3* 7.3* 7.3*  HCT 26.0*   < > 26.5*   < > 25.5* 25.7* 25.2*  MCV 92.5  --  92.3  --   --   --  92.0  MCH  26.0  --  26.1  --   --   --  26.6  MCHC 28.1*  --  28.3*  --   --   --  29.0*  RDW 22.5*  --  22.8*  --   --   --  24.0*  PLT 146*  --  162  --   --   --  147*   < > = values in this interval not displayed.    Cardiac EnzymesNo results for input(s): TROPONINI in the last 168 hours. No results for input(s): TROPIPOC in the last 168 hours.   BNP Recent Labs  Lab 07/09/20 2041  BNP 476.5*     DDimer No results for  input(s): DDIMER in the last 168 hours.   Radiology    DG CHEST PORT 1 VIEW  Result Date: 07/10/2020 CLINICAL DATA:  Shortness of breath.  Postoperative exam. EXAM: PORTABLE CHEST 1 VIEW COMPARISON:  07/05/2020 FINDINGS: Stable right IJ central venous catheter. Median sternotomy and cardiac valve replacement. Left-sided AICD. Stable cardiomegaly. Atherosclerotic calcification of the aortic knob. Persistent left-sided pleural effusion. Hazy bibasilar opacities, similar to prior. No pneumothorax. IMPRESSION: Persistent left-sided pleural effusion with hazy bibasilar opacities, similar to prior. Electronically Signed   By: Davina Poke D.O.   On: 07/10/2020 13:37   DG C-Arm 1-60 Min-No Report  Result Date: 07/10/2020 Fluoroscopy was utilized by the requesting physician.  No radiographic interpretation.   ECHOCARDIOGRAM LIMITED  Result Date: 07/09/2020    ECHOCARDIOGRAM LIMITED REPORT   Patient Name:   [name] Date of Exam: 07/09/2020 Medical Rec #:  [id number]      Height:       62.0 in Accession #:    [id number]     Weight:       208.1 lb Date of Birth:  Apr 18, 1926     BSA:          1.944 m Patient Age:    86 years       BP:           90/46 mmHg Patient Gender: F              HR:           70 bpm. Exam Location:  Inpatient Procedure: Limited Echo, Color Doppler, Cardiac Doppler and Intracardiac            Opacification Agent Indications:    N86.76 Acute systolic (congestive) heart failure  History:        Patient has prior history of Echocardiogram examinations, most                  recent 04/05/2020. CHF, Pacemaker, Arrythmias:Atrial                 Fibrillation; Risk Factors:Hypertension. 2003 46mm Mitral                 Annuloplasty Ring implanted.                  Mitral Valve: 28 mm prosthetic annuloplasty ring valve is                 present in the mitral position.  Sonographer:    Raquel Sarna Senior RDCS Referring Phys: [id number] Donato Heinz  Sonographer Comments: Technically challenging study due to limited acoustic windows. Limited to assess systolic function, valves, and IVC. IMPRESSIONS  1. Left ventricular ejection fraction, by estimation, is 40 to 45%. The left ventricle has mildly decreased function. The left ventricle demonstrates global hypokinesis. Left ventricular diastolic function could not be evaluated.  2. Right ventricular systolic function is mildly reduced. The right ventricular size is mildly enlarged. There is moderately elevated pulmonary artery systolic pressure. The estimated right ventricular systolic pressure is 96.2 mmHg.  3. Left atrial size was severely dilated.  4. Right atrial size was severely dilated.  5. The mitral valve has been repaired/replaced. Trivial mitral valve regurgitation. The mean mitral valve gradient is 4.0 mmHg with average heart rate of 70 bpm. There is a 28 mm prosthetic annuloplasty ring present in the mitral position.  6. The pericardial effusion is posterior to the left ventricle. Moderate pleural effusion in the left lateral  region.  7. The tricuspid valve is abnormal. Tricuspid valve regurgitation is moderate to severe.  8. The aortic valve is tricuspid. Mild aortic valve sclerosis is present, with no evidence of aortic valve stenosis.  9. The inferior vena cava is dilated in size with <50% respiratory variability, suggesting right atrial pressure of 15 mmHg. Comparison(s): Changes from prior study are noted. 04/05/20: LVEF 40%, severe TR. FINDINGS  Left Ventricle: Left ventricular ejection fraction, by estimation, is  40 to 45%. The left ventricle has mildly decreased function. The left ventricle demonstrates global hypokinesis. Definity contrast agent was given IV to delineate the left ventricular  endocardial borders. The left ventricular internal cavity size was normal in size. There is borderline left ventricular hypertrophy. Abnormal (paradoxical) septal motion, consistent with RV pacemaker. Left ventricular diastolic function could not be evaluated due to paced rhythm. Right Ventricle: The right ventricular size is mildly enlarged. No increase in right ventricular wall thickness. Right ventricular systolic function is mildly reduced. There is moderately elevated pulmonary artery systolic pressure. The tricuspid regurgitant velocity is 2.76 m/s, and with an assumed right atrial pressure of 15 mmHg, the estimated right ventricular systolic pressure is 67.1 mmHg. Left Atrium: Left atrial size was severely dilated. Right Atrium: Right atrial size was severely dilated. Pericardium: A small pericardial effusion is present. The pericardial effusion is posterior to the left ventricle. Mitral Valve: The mitral valve has been repaired/replaced. Trivial mitral valve regurgitation. There is a 28 mm prosthetic annuloplasty ring present in the mitral position. MV peak gradient, 10.8 mmHg. The mean mitral valve gradient is 4.0 mmHg with average heart rate of 70 bpm. Tricuspid Valve: The tricuspid valve is abnormal. Tricuspid valve regurgitation is moderate to severe. Aortic Valve: The aortic valve is tricuspid. Mild aortic valve sclerosis is present, with no evidence of aortic valve stenosis. Pulmonic Valve: The pulmonic valve was grossly normal. Pulmonic valve regurgitation is trivial. Venous: The inferior vena cava is dilated in size with less than 50% respiratory variability, suggesting right atrial pressure of 15 mmHg. IAS/Shunts: No atrial level shunt detected by color flow Doppler. Additional Comments: A pacer wire is visualized.  There is a moderate pleural effusion in the left lateral region. LEFT VENTRICLE PLAX 2D LVIDd:         4.90 cm LVIDs:         3.90 cm LV PW:         0.90 cm LV IVS:        1.00 cm LVOT diam:     1.80 cm LV SV:         36 LV SV Index:   18 LVOT Area:     2.54 cm  RIGHT VENTRICLE RV S prime:     7.83 cm/s TAPSE (M-mode): 1.6 cm LEFT ATRIUM         Index LA diam:    4.70 cm 2.42 cm/m  AORTIC VALVE LVOT Vmax:   69.80 cm/s LVOT Vmean:  54.100 cm/s LVOT VTI:    0.141 m MITRAL VALVE             TRICUSPID VALVE MV Area (PHT): 1.59 cm  TR Peak grad:   30.5 mmHg MV Peak grad:  10.8 mmHg TR Vmax:        276.00 cm/s MV Mean grad:  4.0 mmHg MV Vmax:       1.64 m/s  SHUNTS MV Vmean:      88.3 cm/s Systemic VTI:  0.14 m  Systemic Diam: 1.80 cm Lyman Bishop MD Electronically signed by Lyman Bishop MD Signature Date/Time: 07/09/2020/10:04:57 AM    Final     Telemetry    V-paced at 70 bpm.  NSVT x 8beats - Personally Reviewed  ECG    No new tracing- Personally Reviewed  Cardiac Studies   Echo 04/05/20:  1. Left ventricular ejection fraction, by estimation, is 40%. The left  ventricle has mildly decreased function. The left ventricle demonstrates  global hypokinesis. The left ventricular internal cavity size was mildly  dilated. Left ventricular diastolic  parameters are indeterminate.  2. Right ventricular systolic function is moderately reduced. The right  ventricular size is moderately enlarged. The estimated right ventricular  systolic pressure is 59.1 mmHg.  3. Left atrial size was severely dilated.  4. Right atrial size was severely dilated.  5. The mitral valve has been repaired/replaced. Mild mitral valve  regurgitation. No evidence of mitral stenosis however Doppler alignment of  diastolic gradient is slightly suboptimal. The mean mitral valve gradient  is 2.0 mmHg with average heart rate of  70 bpm.  6. Tricuspid valve regurgitation is severe.  7. The aortic  valve is tricuspid. Aortic valve regurgitation is mild. No  aortic stenosis is present.  8. The inferior vena cava is dilated in size with <50% respiratory  variability, suggesting right atrial pressure of 15 mmHg.   Patient Profile     86 y.o. female with a hx of chronic combined systolic/diastolic heart failure, tachybrady syndrome status post BiV PPM, severe MR status post MV repair in 2003, CKD stage III, chronic atrial fibrillation not on anticoagulation due to GI bleed, orthostatic hypotension, moderate pulmonary hypertension, severe TR who is being seen today for the evaluation of heart failure at the request of Dr. Grandville Silos.  Assessment & Plan    Acute on chronic combined systolic and diastolic heart failure: Echo 07/09/2020 with LVEF 40 to 45%, mild RV dysfunction, moderate to severe TR, RVSP 45 mmHg.  Weight increased from 194 pounds on admission to 208 pounds 8/22.  BNP 477 on 8/23. -Diuresis with IV lasix 40 mg BID  Permanent atrial fibrillation: Not on anticoagulation due to GI bleed history.  HRs well controlled on Toprol 12.5 however this was discontinued per family request due to concern over recent hypotension requiring midodrine.  Remains in V-paced rhythm at 70bpm since metoprolol discontinued.  Tachybrady syndrome: s/p BiV PPM  NSVT: Maintain K>4, Mag>2  AKI on CKD stage III:  Peak creatinine 3, has improved, currently 1.8.  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   -Management per oncology, general surgery, and palliative care    Sepsis: Resolved, due to UTI.  Completed course of Augmentin.  Obstructive uropathy: S/p ureteral stent 06/11/2020. Stent removal 07/10/2020  Hypotension: Chronic issue, on midodrine  For questions or updates, please contact   Please consult [email or web address] for contact info under Cardiology/STEMI.   Donato Heinz, MD

## 2020-07-10 NOTE — Progress Notes (Signed)
PROGRESS NOTE    [name]  PNT:[id number] DOB: 10/31/1926 DOA: 06/11/2020 PCP: [name], MD   No chief complaint on file.   Brief Narrative:  86 year old lady with prior history of tachybradycardia syndrome s/p biventricular pacemaker placement in 2007, severe MR s/p repair, chronic atrial fibrillation not on anticoagulation secondary to GI bleed, abnormal esophageal motility consistent with presbyesophagus, gastritis on last endoscopy with balloon dilatation by [name], pulmonary hypertension presents with 2 days of dysuria, and abdominal pain, black stools for several weeks, and increased confusion admitted for sepsis secondary to urinary tract infection in the setting of obstructive uropathy and acute on chronic anemia.  CT of the abdomen and pelvis shows left-sided moderate hydronephrosis with intrarenal calculi bilaterally, left ureteral calculi and soft tissue fullness in the cecal area.  Patient underwent ureteral stent placement by urology on 06/11/2020.  And was started on IV Rocephin and admitted to [facility] service for further evaluation and management. Urine culture showed lactobacillus transition IV Rocephin to oral amoxicillin for another 2 weeks to complete the course. [name] with Urology suggested he will post pone the stent removal to a later date. She underwent CT abd showing cecal mass concerning for malignancy. She underwent colonoscopy on 06/18/20 by [name] showing large cecal mass concerning for malignancy. Gen surgery consulted for further management. Cecal mass pathology shows adenocarcinoma. Cardiology consulted for pre op clearance as she might need surgical intervention.  Oncology consulted who recommended CT chest for further staging.  Patient underwent surgery 06/21/2020 with pathology consistent with adenocarcinoma involving the cecum invading into subserosal area without lymphovascular or perineural invasion, 20 lymph nodes were negative for CA-CEA was 575  on 06/19/2020.  Hospital course complicated by probable ileus as well as multiple dark watery stools, patient reassessed by surgery and Reglan added.  Patient diet advanced to a solid diet since 06/30/2020 which has been tolerating.  Patient noted to have some oozing had a subcutaneous hematoma removed 07/02/2020 and wound VAC placed over base of incision. Patient also noted to have some runs of nonsustained V. tach..   Assessment & Plan:   Principal Problem:   Acute lower UTI Active Problems:   Liver cirrhosis (HCC)   CKD (chronic kidney disease) stage 3, GFR 30-59 ml/min   Anemia   Biventricular cardiac pacemaker in situ   Permanent atrial fibrillation (HCC)   Chronic diastolic CHF (congestive heart failure) (HCC)   Gout   S/P MVR (mitral valve repair)   AKI (acute kidney injury) ([place])   Orthostatic hypotension dysautonomic syndrome (HCC)   GI bleeding   Obstructive uropathy   Sepsis secondary to UTI ([place])   Hydronephrosis of left kidney   Left ureteral stone   Advanced care planning/counseling discussion   Goals of care, counseling/discussion   Palliative care by specialist   DNR (do not resuscitate)   Acute on chronic renal insufficiency   Iron deficiency anemia due to chronic blood loss   Cancer of cecum (HCC)   Emesis   Gastritis with hemorrhage   Acute on chronic diastolic CHF (congestive heart failure) (HCC)   Ileus (HCC)  1 SIRS/Sepsis secondary to lactobacillus UTI Patient on admission met criteria for systemic inflammatory response with, MAP < 65, AKI, urinalysis worrisome for UTI.  Urine cultures positive for lactobacillus.  Patient noted to have left-sided moderate hydronephrosis secondary to left ureteral stone and underwent left ureteral stent placement 06/11/2020.  Patient was on IV Rocephin which has subsequently been discontinued.  Amoxicillin started  06/20/2020 to complete a 14-day course of antibiotic treatment as patient with stent placement and left urethral  stone.  Patient due to emesis and concern for possible aspiration pneumonia amoxicillin discontinued and patient placed on IV Zosyn.  Patient improved clinically was transitioned to Augmentin and completed a full course of antibiotic treatment.  No further antibiotics needed.  Follow.    2.  Obstructive uropathy with left-sided moderate hydronephrosis secondary to left ureteral stone Noted on CT abdomen and pelvis.  Patient seen in consultation by urology and underwent left ureteral stent placement 06/11/2020.  Foley catheter initially discontinued and placed back in perioperatively.  Foley catheter has been discontinued. Urine cultures positive for lactobacillus.  Patient on IV Rocephin which was subsequently discontinued.  Amoxicillin started 06/20/2020 to complete a 10 - 14-day course of antibiotic treatment.  Patient with good urine output.  Clinical improvement.  Per urology reschedule uteroscopy for about 2 to 3 weeks out to give patient opportunity to recover from pending colon resection.  Patient with bout of nausea and emesis 06/24/2020, concern for aspiration pneumonia and as such antibiotic were changed to IV Zosyn.  IV Zosyn was subsequently transitioned to Augmentin and patient completed full course of antibiotic treatment.  No further antibiotics needed.  Patient for stent removal today 07/10/2020 per urology. Urology following and appreciate input and recommendations.     3.  Cecal adenocarcinoma Soft tissue fullness noted in cecal area on CT abdomen and pelvis.  Repeat CT abdomen and pelvis showing a lobulated mass in the cecum concerning for malignancy.  GI consulted and patient underwent colonoscopy with biopsy on 06/18/2020 per Dr. Michail Sermon with pathology positive for adenocarcinoma.  Patient seen in consultation by general surgery to see whether she is a candidate for diverting colostomy.  Patient seen in consultation by cardiology for preop clearance.  Patient currently not obstructed at this  time.  CT chest with interval development of 10 mm subpleural rounded density seen posteriorly in the right lung apex.  Follow-up unenhanced chest CT in 3 months recommended to ensure stability or resolution.  Patient s/p laparoscopic right colectomy 06/22/2020.  Oncology consulted and are following and will evaluate postop to determine if further adjuvant treatment is feasible or warranted.  Palliative care also following.  Patient was tolerating a clear liquid diet and diet advanced to a soft diet.  Patient noted to have nausea and emesis the afternoon of 06/24/2020 and placed on a clear liquid diet.  Abdominal films obtained concerning for ileus versus SBO.  Ileus resolved.  Currently tolerating a soft diet.  Will likely follow-up with oncology in the outpatient setting.  Appreciate general surgery, oncology, palliative care input and recommendations.  4.  Fascial deficit status post hematoma removal from base of wound 07/02/2020 Patient being followed by wound care nurse as well as general surgery.  Wound VAC placed.  Per general surgery.  5.  Nausea and emesis/?  Ileus versus SBO Patient noted to have nausea and emesis the afternoon of 06/24/2020 after being started on a soft diet.  Abdominal films obtained concerning for ileus versus SBO bowel gas pattern.  Patient was started on Reglan.  Patient having bowel movements.  Ileus resolved.  Tolerating diet.  Per general surgery.  Supportive care.   6.??  Aspiration pneumonia Patient with bout of nausea and emesis on 06/24/2020.  Acute abdominal series done with left pleural effusion with left lower lobe atelectasis or infiltrate, cardiomegaly, increasing right basilar atelectasis or infiltrate.  Amoxicillin was discontinued  patient was placed on IV Zosyn.  Patient subsequently transitioned from IV Zosyn to Augmentin and has completed a full course of antibiotic treatment.  No further antibiotics needed.   7.  Acute on chronic  combined systolic diastolic heart  failure Patient with expiratory wheezing (early on in the hospitalization, which has since improved with diuresis), 1-2+ bilateral lower extremity edema over the past few days which has improved with diuresis..  Patient is +10 L during this hospitalization however unsure of accuracy of I's and O's.  Patient noted to have a weight of 93.7 kg from 94.4 kg(07/08/2020) from 93.6 kg from 94.2 kg from 94 kg from 93.6 kg (07/03/2020) from 87.1 kg (07/02/2020).  2D echo from May 2021 with a EF of 40%.  Patient denies any significant shortness of breath. Patient received a dose of IV Lasix on 06/21/2020.  Patient on Lasix 20 mg daily however was on Demadex prior to admission.  Patient placed on Lasix 20 mg IV every 12 hours which has subsequently been discontinued. Status post IV albumin x1.  Home dose Demadex resumed and subsequently discontinued.  Patient started on Lasix 40 mg IV every 12 hours per cardiology recommendations..  Strict I's and O's.  Daily weights.  Family requested cardiology consultation to manage patient's cardiac issues while hospitalized.  ??  Increasing midodrine to allow for further diuresis however will defer to cardiology.  Per cardiology.  8.  Chronic hypotension Continue midodrine.    9.  Permanent atrial fibrillation Was rate controlled on beta-blocker however per family request and due to concerns of chronic hypotension beta-blocker has been discontinued.  Patient not a anticoagulation candidate secondary to history of GI bleed.  Cardiology was following however have signed off.  Patient's daughter concerned about patient receiving beta-blocker in the setting of her chronic hypotension (on midodrine) and requesting beta-blocker be discontinued with consultation of cardiology to manage patient's cardiac issues.  Cardiology consulted and are following.    10.  Tachybradycardia syndrome with BiV PPM/nonsustained V. tach Stable.  Last interrogation April 2021 patient noted to be V pacing.   Patient noted to have a bout of nonsustained V. tach the morning of 07/05/2020 per RN.  Potassium currently at 3.8.  Keep potassium > 4.  Keep magnesium >2.  Patient was on beta-blocker which has been discontinued per family request due to patient's chronic hypotension.  Patient with chronic hypotension on midodrine.  Daughter concerned about patient receiving beta-blocker in the setting of her chronic hypotension and requesting evaluation by cardiology to help manage patient's chronic cardiac issues, and discontinuation of beta-blocker.  Beta-blocker has been discontinued.  Patient noted to have a bout of 8 beat run of V. tach the evening of 07/09/2020 however remained asymptomatic.  Per cardiology.  11.  Acute on chronic kidney disease stage IIIb Likely post renal azotemia in the setting of obstructive uropathy in the setting of sepsis and relative hypotension on admission.  Creatinine noted to have peaked at 3 and trended down.  Creatinine currently at 1.81.  Urine output for the past 24 hours. Patient received a dose of IV Lasix on 06/21/2020.  Demadex was resumed however held due to nausea and vomiting.  Creatinine trending down with diuresis.  Patient was on oral Lasix 20 mg daily, however patient with some expiratory wheezing and some lower extremity edema due to concerns for volume overload oral Lasix has been changed to IV Lasix.  Patient noted to have been +10 L during this hospitalization however doubt  accuracy of I's and O's.  Patient given a dose of Lasix 20 mg IV x1 on 07/04/2020.  Patient supposedly is +10.148 L during this hospitalization however unsure about accuracy of I's and O's.  Patient with a bump in sodium levels.  IV Lasix temporarily discontinued and patient temporally resumed on home dose oral Demadex.  Patient seen in consultation by cardiology per family request and due to concerns for volume overload patient placed back on IV Lasix.  Current weight of 93.7 kg from 94.4 kg from 93.6 kg  from 94.2 kg from 94 kg from 93.6 kg (07/03/2020) from 87.1 kg (07/02/2020). Follow.   12.  Hypernatremia Likely secondary to free water deficit secondary to poor oral intake.  Slight bump in sodium level.  Diuretics initially held and subsequently patient restarted on low-dose Lasix 20 mg daily.  Concern for volume overload and as such patient started on IV Lasix.  Sodium increased as high as 149.  IV Lasix discontinued.  Patient hydrated gently with D5W at 50 cc an hour for 12 hours on 07/07/2020.  Patient was placed back on Demadex however that has been discontinued and patient currently on IV diuretics per cardiology recommendations.  Hypernatremia seems to have improved on diuresis.  Follow.    13.  Iron deficiency anemia secondary to colonic cancer bleed/anemia of chronic disease Status post recent EGD showing gastritis and presbyesophagus.  Baseline hemoglobin approximately 9.  On admission hemoglobin noted to be 7.9.  Status post 1 unit transfusion packed red blood cells, hemoglobin stable at 7.3.  Status post IV Feraheme x2 (06/24/2020, 06/30/2020). Transfusion threshold hemoglobin < 7.  14.  Vitamin D deficiency Continue Os-Cal with vitamin D 3 times daily.  Outpatient follow-up.   15.??  Confusion versus sundowning Patient with some bouts of confusion usually starting in the evening after 4 PM.  Likely sundowning versus secondary to long hospital stay.  TSH slightly elevated.  Free T4 within normal limits.  Ammonia level at 35.  Vitamin B12 at 362.  Folate at 8.6.  Patient with no focal neurological deficits.  Daughter requested cardiology consultation as she feels beta-blocker in the setting of patient's chronic hypotension may be leading to some confusion and requesting beta-blocker be discontinued.  Beta-blocker discontinued.  Cardiology consulted and are following.     DVT prophylaxis: SCDs.  History of GI bleed. Code Status: full Family Communication: Updated patient.  No family at  bedside.  Disposition:   Status is: Inpatient    Dispo: The patient is from: Assisted living facility              Anticipated d/c is to: SNF              Anticipated d/c date is: 1 to 2 days.              Patient currently worked up for cecal cancer and s/p laparoscopic right colectomy 06/22/2020.  Patient with expiratory wheezing concern for volume overload which is improving.  Patient being diuresed and IV Lasix.  General surgery following.  Oncology was following.  Patient for urological procedure today.  Patient not stable for discharge.        Consultants:   General surgery: Dr.Toth III 06/19/2020  Cardiology: Dr. Radford Pax 06/19/2020  Gastroenterology: Dr. Watt Climes 06/11/2020  Urology: Dr. Jeffie Pollock 06/11/2020  Oncology Dr. Lorenso Courier 06/20/2020  Palliative care: Dr. Rowe Pavy 06/20/2020  Procedures:  CT chest 06/28/2020  CT abdomen and pelvis 06/15/2020, 06/11/2020, 07/02/2020  Chest x-ray 06/11/2020  Colonoscopy 06/18/2020--per Dr. Michail Sermon gastroenterology  Cystoscopy with left retrograde pyelogram and interpretation/cystoscopy with insertion of left double-J stent per Dr.Wrenn 06/11/2020  Transfusion 1 unit packed red blood cells 06/11/2020  Laparoscopic right colectomy by Dr. Marlou Starks III 06/22/2020  Evacuation of subcutaneous hematoma per general surgery 07/02/2020  Antimicrobials:  IV Rocephin 06/11/2020>>>> 06/14/2020  Amoxicillin 06/20/2020>>>>> 06/24/2020  IV Zosyn 06/24/2020>>>>> 06/28/2020  Augmentin 06/28/2020>>>> 07/03/2020   Subjective: In bed.  No chest pain.  No shortness of breath.  About to go for urological procedure.    Objective: Vitals:   07/09/20 1849 07/09/20 2037 07/10/20 0619 07/10/20 0928  BP: (!) 105/46 (!) 99/48 (!) 91/36 (!) 91/51  Pulse: 70 70 77 70  Resp: _0 Temp: (!) 97.4 F (36.3 C) (!) 97.5 F (36.4 C) (!) 97.5 F (36.4 C) 97.9 F (36.6 C)  TempSrc: Oral Oral Oral Oral  SpO2: 98% 97% 100% 99%  Weight:   93.7 kg   Height:        Intake/Output  Summary (Last 24 hours) at 07/10/2020 1022 Last data filed at 07/10/2020 0810 Gross per 24 hour  Intake 240 ml  Output 800 ml  Net -560 ml   Filed Weights   07/07/20 0500 07/08/20 0554 07/10/20 0619  Weight: 93.6 kg 94.4 kg 93.7 kg    Examination:  General exam: NAD. Respiratory system: Minimal to mild expiratory wheezing.  Fair air movement.  Speaking in full sentences.  No use of accessory muscles of respiration. Cardiovascular system: Irregularly irregular.  No murmurs rubs or gallops.  No JVD.  Trace bilateral lower extremity edema.  Gastrointestinal system: Abdomen is obese, soft, nontender, nondistended, positive bowel sounds.  Wound VAC over mid abdominal region.  No rebound.  No guarding.  Central nervous system: Alert.  No focal neurological deficits.  Moving extremities spontaneously.  Extremities: Symmetric 5 x 5 power. Skin: No rashes, lesions or ulcers Psychiatry: Judgement and insight appear fair. Mood & affect appropriate.     Data Reviewed: I have personally reviewed following labs and imaging studies  CBC: Recent Labs  Lab 07/04/20 0310 07/04/20 0310 07/05/20 0415 07/05/20 0415 07/06/20 0500 07/07/20 0400 07/08/20 0531 07/09/20 0304 07/10/20 0405  WBC 6.8  --  7.0  --  7.0  --   --   --  7.0  NEUTROABS 4.1  --  4.2  --  4.6  --   --   --  4.8  HGB 7.4*   < > 7.3*   < > 7.5* 7.4* 7.3* 7.3* 7.3*  HCT 26.2*   < > 26.0*   < > 26.5* 26.1* 25.5* 25.7* 25.2*  MCV 92.6  --  92.5  --  92.3  --   --   --  92.0  PLT 154  --  146*  --  162  --   --   --  147*   < > = values in this interval not displayed.    Basic Metabolic Panel: Recent Labs  Lab 07/04/20 0310 07/04/20 0310 07/04/20 1109 07/05/20 0415 07/06/20 0500 07/07/20 1451 07/08/20 0533 07/09/20 0304 07/09/20 2041 07/10/20 0405  NA 141   < >  --  146*   < > 145 145 147* 146* 142  K 3.1*   < >  --  4.0   < > 4.0 3.8 3.9 4.0 3.7  CL 118*   < >  --  122*   < > 119* 117* 118* 116* 115*  CO2  16*    < >  --  18*   < > 20* 20* 20* 20* 20*  GLUCOSE 99   < >  --  92   < > 115* 85 79 84 80  BUN 24*   < >  --  22   < > 32* 33* 43* 45* 43*  CREATININE 1.83*   < >  --  1.85*   < > 1.70* 1.61* 1.87* 2.02* 1.81*  CALCIUM 7.0*   < >  --  7.2*   < > 7.1* 7.3* 7.5* 7.6* 7.1*  MG 2.0  --   --  1.9  --   --   --   --  2.1 2.2  PHOS  --   --  3.2  --   --  2.8 3.1  --   --   --    < > = values in this interval not displayed.    GFR: Estimated Creatinine Clearance: 21.6 mL/min (A) (by C-G formula based on SCr of 1.81 mg/dL (H)).  Liver Function Tests: Recent Labs  Lab 07/04/20 0310 07/05/20 0415 07/06/20 0500 07/07/20 1451 07/08/20 0533  AST 14* 15 16  --   --   ALT _0 --   --   ALKPHOS 91 92 97  --   --   BILITOT 0.5 0.6 0.6  --   --   PROT 4.5* 4.5* 4.6*  --   --   ALBUMIN 1.9* 1.9* 2.0* 2.2* 2.4*    CBG: Recent Labs  Lab 07/06/20 1623  GLUCAP 95     Recent Results (from the past 240 hour(s))  Urine Culture     Status: None   Collection Time: 07/02/20 10:57 AM   Specimen: Urine, Catheterized  Result Value Ref Range Status   Specimen Description   Final    URINE, CATHETERIZED Performed at [facility], [place] [address], [place], [place] [address]    Special Requests   Final    NONE Performed at [facility], [place] [address] [place], [place] [address]    Culture   Final    NO GROWTH Performed at [facility], [place] [address], [place], [place] [address]    Report Status 07/03/2020 FINAL  Final         Radiology Studies: ECHOCARDIOGRAM LIMITED  Result Date: 07/09/2020    ECHOCARDIOGRAM LIMITED REPORT   Patient Name:   SHAMARIAH SHEWMAKE Date of Exam: 07/09/2020 Medical Rec #:  [id number]      Height:       62.0 in Accession #:    [id number]     Weight:       208.1 lb Date of Birth:  [birth year]     BSA:          1.944 m Patient Age:    45 years       BP:           90/46 mmHg Patient Gender: F              HR:           70 bpm.  Exam Location:  Inpatient Procedure: Limited Echo, Color Doppler, Cardiac Doppler and Intracardiac            Opacification Agent Indications:    R51.88 Acute systolic (congestive) heart failure  History:        Patient has prior history of Echocardiogram examinations, most  recent 04/05/2020. CHF, Pacemaker, Arrythmias:Atrial                 Fibrillation; Risk Factors:Hypertension. 2003 68m Mitral                 Annuloplasty Ring implanted.                  Mitral Valve: 28 mm prosthetic annuloplasty ring valve is                 present in the mitral position.  Sonographer:    ERaquel SarnaSenior RDCS Referring Phys: [id number] Heinz Sonographer Comments: Technically challenging study due to limited acoustic windows. Limited to assess systolic function, valves, and IVC. IMPRESSIONS  1. Left ventricular ejection fraction, by estimation, is 40 to 45%. The left ventricle has mildly decreased function. The left ventricle demonstrates global hypokinesis. Left ventricular diastolic function could not be evaluated.  2. Right ventricular systolic function is mildly reduced. The right ventricular size is mildly enlarged. There is moderately elevated pulmonary artery systolic pressure. The estimated right ventricular systolic pressure is 494.4mmHg.  3. Left atrial size was severely dilated.  4. Right atrial size was severely dilated.  5. The mitral valve has been repaired/replaced. Trivial mitral valve regurgitation. The mean mitral valve gradient is 4.0 mmHg with average heart rate of 70 bpm. There is a 28 mm prosthetic annuloplasty ring present in the mitral position.  6. The pericardial effusion is posterior to the left ventricle. Moderate pleural effusion in the left lateral region.  7. The tricuspid valve is abnormal. Tricuspid valve regurgitation is moderate to severe.  8. The aortic valve is tricuspid. Mild aortic valve sclerosis is present, with no evidence of aortic valve stenosis.  9. The  inferior vena cava is dilated in size with <50% respiratory variability, suggesting right atrial pressure of 15 mmHg. Comparison(s): Changes from prior study are noted. 04/05/20: LVEF 40%, severe TR. FINDINGS  Left Ventricle: Left ventricular ejection fraction, by estimation, is 40 to 45%. The left ventricle has mildly decreased function. The left ventricle demonstrates global hypokinesis. Definity contrast agent was given IV to delineate the left ventricular  endocardial borders. The left ventricular internal cavity size was normal in size. There is borderline left ventricular hypertrophy. Abnormal (paradoxical) septal motion, consistent with RV pacemaker. Left ventricular diastolic function could not be evaluated due to paced rhythm. Right Ventricle: The right ventricular size is mildly enlarged. No increase in right ventricular wall thickness. Right ventricular systolic function is mildly reduced. There is moderately elevated pulmonary artery systolic pressure. The tricuspid regurgitant velocity is 2.76 m/s, and with an assumed right atrial pressure of 15 mmHg, the estimated right ventricular systolic pressure is 496.7mmHg. Left Atrium: Left atrial size was severely dilated. Right Atrium: Right atrial size was severely dilated. Pericardium: A small pericardial effusion is present. The pericardial effusion is posterior to the left ventricle. Mitral Valve: The mitral valve has been repaired/replaced. Trivial mitral valve regurgitation. There is a 28 mm prosthetic annuloplasty ring present in the mitral position. MV peak gradient, 10.8 mmHg. The mean mitral valve gradient is 4.0 mmHg with average heart rate of 70 bpm. Tricuspid Valve: The tricuspid valve is abnormal. Tricuspid valve regurgitation is moderate to severe. Aortic Valve: The aortic valve is tricuspid. Mild aortic valve sclerosis is present, with no evidence of aortic valve stenosis. Pulmonic Valve: The pulmonic valve was grossly normal. Pulmonic valve  regurgitation is trivial. Venous: The inferior vena cava  is dilated in size with less than 50% respiratory variability, suggesting right atrial pressure of 15 mmHg. IAS/Shunts: No atrial level shunt detected by color flow Doppler. Additional Comments: A pacer wire is visualized. There is a moderate pleural effusion in the left lateral region. LEFT VENTRICLE PLAX 2D LVIDd:         4.90 cm LVIDs:         3.90 cm LV PW:         0.90 cm LV IVS:        1.00 cm LVOT diam:     1.80 cm LV SV:         36 LV SV Index:   18 LVOT Area:     2.54 cm  RIGHT VENTRICLE RV S prime:     7.83 cm/s TAPSE (M-mode): 1.6 cm LEFT ATRIUM         Index LA diam:    4.70 cm 2.42 cm/m  AORTIC VALVE LVOT Vmax:   69.80 cm/s LVOT Vmean:  54.100 cm/s LVOT VTI:    0.141 m MITRAL VALVE             TRICUSPID VALVE MV Area (PHT): 1.59 cm  TR Peak grad:   30.5 mmHg MV Peak grad:  10.8 mmHg TR Vmax:        276.00 cm/s MV Mean grad:  4.0 mmHg MV Vmax:       1.64 m/s  SHUNTS MV Vmean:      88.3 cm/s Systemic VTI:  0.14 m                          Systemic Diam: 1.80 cm Lyman Bishop MD Electronically signed by Lyman Bishop MD Signature Date/Time: 07/09/2020/10:04:57 AM    Final         Scheduled Meds:  [MAR Hold] (feeding supplement) PROSource Plus  30 mL Oral BID BM   [MAR Hold] acetaminophen  1,000 mg Oral Q6H   [MAR Hold] calcium-vitamin D  1 tablet Oral TID   [MAR Hold] Chlorhexidine Gluconate Cloth  6 each Topical Daily   [MAR Hold] feeding supplement  1 Container Oral TID BM   [MAR Hold] furosemide  40 mg Intravenous BID   [MAR Hold] midodrine  5 mg Oral TID WC   [MAR Hold] multivitamin with minerals  1 tablet Oral Daily   [MAR Hold] pantoprazole  40 mg Oral Daily   [MAR Hold] potassium chloride  20 mEq Oral Once   [MAR Hold] sodium bicarbonate  650 mg Oral BID   [MAR Hold] sodium chloride flush  3 mL Intravenous Q12H   [MAR Hold] torsemide  20 mg Oral Daily   Continuous Infusions:  [MAR Hold] cefTRIAXone  (ROCEPHIN)  IV       LOS: 29 days    Time spent: 35 minutes    Irine Seal, MD [facility]   To contact the attending provider between 7A-7P or the covering provider during after hours 7P-7A, please log into the web site [email or web address] and access using universal [place] password for that web site. If you do not have the password, please call the hospital operator.  07/10/2020, 10:22 AM

## 2020-07-10 NOTE — Anesthesia Procedure Notes (Signed)
Procedure Name: Intubation Date/Time: 07/10/2020 11:14 AM Performed by: Niel Hummer, CRNA Pre-anesthesia Checklist: Patient identified, Emergency Drugs available, Suction available and Patient being monitored Patient Re-evaluated:Patient Re-evaluated prior to induction Oxygen Delivery Method: Circle system utilized Preoxygenation: Pre-oxygenation with 100% oxygen Induction Type: IV induction Laryngoscope Size: Mac and 3 Grade View: Grade I Tube type: Oral Tube size: 7.0 mm Number of attempts: 1 Airway Equipment and Method: Stylet Placement Confirmation: ETT inserted through vocal cords under direct vision,  positive ETCO2 and breath sounds checked- equal and bilateral Secured at: 21 cm Tube secured with: Tape Dental Injury: Teeth and Oropharynx as per pre-operative assessment  Comments: LMA 4 with gastric port placed, unable to seat. Decision made for ETT.

## 2020-07-10 NOTE — Anesthesia Preprocedure Evaluation (Signed)
Anesthesia Evaluation  Patient identified by MRN, date of birth, ID band Patient awake    Reviewed: Allergy & Precautions, NPO status , Patient's Chart, lab work & pertinent test results  Airway Mallampati: II  TM Distance: >3 FB     Dental  (+) Edentulous Upper, Edentulous Lower   Pulmonary former smoker,   No wheezing, no rhonki   + decreased breath sounds      Cardiovascular hypertension,  Rhythm:Regular Rate:Normal     Neuro/Psych    GI/Hepatic   Endo/Other    Renal/GU      Musculoskeletal   Abdominal (+) + obese,   Peds  Hematology   Anesthesia Other Findings   Reproductive/Obstetrics                             Anesthesia Physical Anesthesia Plan  ASA: III  Anesthesia Plan: General   Post-op Pain Management:    Induction: Intravenous  PONV Risk Score and Plan:   Airway Management Planned: Oral ETT  Additional Equipment:   Intra-op Plan:   Post-operative Plan: Extubation in OR  Informed Consent: I have reviewed the patients History and Physical, chart, labs and discussed the procedure including the risks, benefits and alternatives for the proposed anesthesia with the patient or authorized representative who has indicated his/her understanding and acceptance.       Plan Discussed with: CRNA and Anesthesiologist  Anesthesia Plan Comments: (L. Ureteral stone with stent and admission 06/18/20 with urosepsis S/p colon resection Chronic renal insufficiency Cr. 1.81 S/P MV repair Chronic afib not on anticoagulation H/O heart failure EF 40% ,no rales on exam Biventricular pacemaker not pacer dependent  Plan GA with oral ETT )        Anesthesia Quick Evaluation

## 2020-07-10 NOTE — Transfer of Care (Signed)
Immediate Anesthesia Transfer of Care Note  Patient: [name]  Procedure(s) Performed: LEFT URETEROSCOPY/HOLMIUM LASER/STENT EXCHANGE (Left )  Patient Location: PACU  Anesthesia Type:General  Level of Consciousness: awake, alert  and oriented  Airway & Oxygen Therapy: Patient Spontanous Breathing and Patient connected to face mask oxygen  Post-op Assessment: Report given to RN, Post -op Vital signs reviewed and stable and Patient moving all extremities X 4  Post vital signs: Reviewed and stable  Last Vitals:  Vitals Value Taken Time  BP 113/49 07/10/20 1231  Temp    Pulse 71 07/10/20 1233  Resp 20 07/10/20 1233  SpO2 100 % 07/10/20 1233  Vitals shown include unvalidated device data.  Last Pain:  Vitals:   07/10/20 0944  TempSrc:   PainSc: 0-No pain      Patients Stated Pain Goal: 3 (57/50/51 8335)  Complications: No complications documented.

## 2020-07-10 NOTE — Progress Notes (Signed)
Nutrition Follow-up  DOCUMENTATION CODES:   Obesity unspecified  INTERVENTION:  - diet re-advancement as medically feasible. - continue Boost Breeze TID and 30 ml Prosource Plus BID.  NUTRITION DIAGNOSIS:   Inadequate oral intake related to altered GI function (newly diagnosed colon adenocarcimona pending right colectomy) as evidenced by  (NPO/CL > 5 days). -ongoing now   GOAL:   Patient will meet greater than or equal to 90% of their needs -unmet at this time.   MONITOR:   PO intake, Supplement acceptance, Diet advancement, Weight trends, Labs, I & O's  ASSESSMENT:   86 year old female with past medical history of tachybradycardia syndrome s/p BiV pacemaker in 2007, severe MR s/p repair, chronic atrial fibrillation, history of GI bleed, abnormal esophageal motility consistent with presbyesophagus, gastritis with recent balloon dilation on July 20, HTN, and gout presented with 2 day history of urinary tract symptoms, right upper abdominal pain, and several week history of black stools admitted for sepsis secondary to UTI in the setting of obstructive uropathy and acute on chronic anemia.  Patient has been NPO since midnight and plan to be made NPO at midnight tonight as well. She has been eating mainly 25-50% of meals over the past 6 days. She has been accepting Boost Breeze and Prosource Plus 90% of the time offered.   Weight has been mainly stable throughout admission. Moderate edema to BLE and LUE documented in the flow sheet.  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 Medications reviewed; 1 tablet oscal-D TID, 40 mg IV lasix BID. 1 tablet multivitamin with minerals/day, 40 mg oral protonix/day, 20 mEq Klor-Con x1 dose 8/24, 650 mg sodium bicarb BID.    Diet Order:   Diet Order            Diet NPO time specified  Diet effective midnight           Diet NPO time specified Except for: Sips with Meds  Diet effective midnight                 EDUCATION NEEDS:   Not appropriate for education at this time  Skin:  Skin Assessment: Skin Integrity Issues: Skin Integrity Issues:: Other (Comment) Other: MASD; buttocks  Last BM:  8/22  Height:   Ht Readings from Last 1 Encounters:  06/11/20 5\' 2"  (1.575 m)    Weight:   Wt Readings from Last 1 Encounters:  07/10/20 93.7 kg     Estimated Nutritional Needs:  Kcal:  1610-9604 Protein:  98-108 Fluid:  >/= 1.7 L     Jarome Matin, MS, RD, LDN, CNSC Inpatient Clinical Dietitian RD pager # available in [place]  After hours/weekend pager # available in [facility]

## 2020-07-10 NOTE — Progress Notes (Signed)
[facility] Progress Note  Day of Surgery  Subjective: Patient denies abdominal pain. Tolerating PO intake.   Objective: Vital signs in last 24 hours: Temp:  [97.4 F (36.3 C)-97.9 F (36.6 C)] 97.4 F (36.3 C) (08/24 1400) Pulse Rate:  [69-77] 71 (08/24 1442) Resp:  [15-29] 20 (08/24 1442) BP: (86-111)/(14-83) 103/43 (08/24 1442) SpO2:  [97 %-100 %] 100 % (08/24 1442) Weight:  [93.7 kg] 93.7 kg (08/24 0619) Last BM Date: 07/08/20  Intake/Output from previous day: 08/23 0701 - 08/24 0700 In: 360 [P.O.:360] Out: 800 [Urine:800] Intake/Output this shift: Total I/O In: 700 [I.V.:600; IV Piggyback:100] Out: -   PE: General: pleasant, WD, obese female who is laying in bed in NAD Lungs: Respiratory effort nonlabored Abd: soft, NT, ND, midline incision clean with beefy red granulation tissue     Lab Results:  Recent Labs    07/09/20 0304 07/10/20 0405  WBC  --  7.0  HGB 7.3* 7.3*  HCT 25.7* 25.2*  PLT  --  147*   BMET Recent Labs    07/09/20 2041 07/10/20 0405  NA 146* 142  K 4.0 3.7  CL 116* 115*  CO2 20* 20*  GLUCOSE 84 80  BUN 45* 43*  CREATININE 2.02* 1.81*  CALCIUM 7.6* 7.1*   PT/INR No results for input(s): LABPROT, INR in the last 72 hours. CMP     Component Value Date/Time   NA 142 07/10/2020 0405   NA 146 (H) 05/30/2019 1332   K 3.7 07/10/2020 0405   CL 115 (H) 07/10/2020 0405   CO2 20 (L) 07/10/2020 0405   GLUCOSE 80 07/10/2020 0405   BUN 43 (H) 07/10/2020 0405   BUN 25 05/30/2019 1332   CREATININE 1.81 (H) 07/10/2020 0405   CREATININE 1.49 (H) 07/30/2016 1509   CALCIUM 7.1 (L) 07/10/2020 0405   PROT 4.6 (L) 07/06/2020 0500   ALBUMIN 2.4 (L) 07/08/2020 0533   AST 16 07/06/2020 0500   ALT 10 07/06/2020 0500   ALKPHOS 97 07/06/2020 0500   BILITOT 0.6 07/06/2020 0500   GFRNONAA 24 (L) 07/10/2020 0405   GFRAA 28 (L) 07/10/2020 0405   Lipase     Component Value Date/Time   LIPASE 27 06/11/2020 0123        Studies/Results: DG CHEST PORT 1 VIEW  Result Date: 07/10/2020 CLINICAL DATA:  Shortness of breath.  Postoperative exam. EXAM: PORTABLE CHEST 1 VIEW COMPARISON:  07/05/2020 FINDINGS: Stable right IJ central venous catheter. Median sternotomy and cardiac valve replacement. Left-sided AICD. Stable cardiomegaly. Atherosclerotic calcification of the aortic knob. Persistent left-sided pleural effusion. Hazy bibasilar opacities, similar to prior. No pneumothorax. IMPRESSION: Persistent left-sided pleural effusion with hazy bibasilar opacities, similar to prior. Electronically Signed   By: Davina Poke D.O.   On: 07/10/2020 13:37   DG C-Arm 1-60 Min-No Report  Result Date: 07/10/2020 Fluoroscopy was utilized by the requesting physician.  No radiographic interpretation.   ECHOCARDIOGRAM LIMITED  Result Date: 07/09/2020    ECHOCARDIOGRAM LIMITED REPORT   Patient Name:   MICHALINE KINDIG Date of Exam: 07/09/2020 Medical Rec #:  [id number]      Height:       62.0 in Accession #:    [id number]     Weight:       208.1 lb Date of Birth:  November 26, 1925     BSA:          1.944 m Patient Age:    37 years       BP:  90/46 mmHg Patient Gender: F              HR:           70 bpm. Exam Location:  Inpatient Procedure: Limited Echo, Color Doppler, Cardiac Doppler and Intracardiac            Opacification Agent Indications:    A19.37 Acute systolic (congestive) heart failure  History:        Patient has prior history of Echocardiogram examinations, most                 recent 04/05/2020. CHF, Pacemaker, Arrythmias:Atrial                 Fibrillation; Risk Factors:Hypertension. 2003 74mm Mitral                 Annuloplasty Ring implanted.                  Mitral Valve: 28 mm prosthetic annuloplasty ring valve is                 present in the mitral position.  Sonographer:    Raquel Sarna Senior RDCS Referring Phys: [id number] Donato Heinz  Sonographer Comments: Technically challenging study due to limited  acoustic windows. Limited to assess systolic function, valves, and IVC. IMPRESSIONS  1. Left ventricular ejection fraction, by estimation, is 40 to 45%. The left ventricle has mildly decreased function. The left ventricle demonstrates global hypokinesis. Left ventricular diastolic function could not be evaluated.  2. Right ventricular systolic function is mildly reduced. The right ventricular size is mildly enlarged. There is moderately elevated pulmonary artery systolic pressure. The estimated right ventricular systolic pressure is 35.3 mmHg.  3. Left atrial size was severely dilated.  4. Right atrial size was severely dilated.  5. The mitral valve has been repaired/replaced. Trivial mitral valve regurgitation. The mean mitral valve gradient is 4.0 mmHg with average heart rate of 70 bpm. There is a 28 mm prosthetic annuloplasty ring present in the mitral position.  6. The pericardial effusion is posterior to the left ventricle. Moderate pleural effusion in the left lateral region.  7. The tricuspid valve is abnormal. Tricuspid valve regurgitation is moderate to severe.  8. The aortic valve is tricuspid. Mild aortic valve sclerosis is present, with no evidence of aortic valve stenosis.  9. The inferior vena cava is dilated in size with <50% respiratory variability, suggesting right atrial pressure of 15 mmHg. Comparison(s): Changes from prior study are noted. 04/05/20: LVEF 40%, severe TR. FINDINGS  Left Ventricle: Left ventricular ejection fraction, by estimation, is 40 to 45%. The left ventricle has mildly decreased function. The left ventricle demonstrates global hypokinesis. Definity contrast agent was given IV to delineate the left ventricular  endocardial borders. The left ventricular internal cavity size was normal in size. There is borderline left ventricular hypertrophy. Abnormal (paradoxical) septal motion, consistent with RV pacemaker. Left ventricular diastolic function could not be evaluated due to paced  rhythm. Right Ventricle: The right ventricular size is mildly enlarged. No increase in right ventricular wall thickness. Right ventricular systolic function is mildly reduced. There is moderately elevated pulmonary artery systolic pressure. The tricuspid regurgitant velocity is 2.76 m/s, and with an assumed right atrial pressure of 15 mmHg, the estimated right ventricular systolic pressure is 29.9 mmHg. Left Atrium: Left atrial size was severely dilated. Right Atrium: Right atrial size was severely dilated. Pericardium: A small pericardial effusion is present. The pericardial effusion is posterior to  the left ventricle. Mitral Valve: The mitral valve has been repaired/replaced. Trivial mitral valve regurgitation. There is a 28 mm prosthetic annuloplasty ring present in the mitral position. MV peak gradient, 10.8 mmHg. The mean mitral valve gradient is 4.0 mmHg with average heart rate of 70 bpm. Tricuspid Valve: The tricuspid valve is abnormal. Tricuspid valve regurgitation is moderate to severe. Aortic Valve: The aortic valve is tricuspid. Mild aortic valve sclerosis is present, with no evidence of aortic valve stenosis. Pulmonic Valve: The pulmonic valve was grossly normal. Pulmonic valve regurgitation is trivial. Venous: The inferior vena cava is dilated in size with less than 50% respiratory variability, suggesting right atrial pressure of 15 mmHg. IAS/Shunts: No atrial level shunt detected by color flow Doppler. Additional Comments: A pacer wire is visualized. There is a moderate pleural effusion in the left lateral region. LEFT VENTRICLE PLAX 2D LVIDd:         4.90 cm LVIDs:         3.90 cm LV PW:         0.90 cm LV IVS:        1.00 cm LVOT diam:     1.80 cm LV SV:         36 LV SV Index:   18 LVOT Area:     2.54 cm  RIGHT VENTRICLE RV S prime:     7.83 cm/s TAPSE (M-mode): 1.6 cm LEFT ATRIUM         Index LA diam:    4.70 cm 2.42 cm/m  AORTIC VALVE LVOT Vmax:   69.80 cm/s LVOT Vmean:  54.100 cm/s LVOT VTI:     0.141 m MITRAL VALVE             TRICUSPID VALVE MV Area (PHT): 1.59 cm  TR Peak grad:   30.5 mmHg MV Peak grad:  10.8 mmHg TR Vmax:        276.00 cm/s MV Mean grad:  4.0 mmHg MV Vmax:       1.64 m/s  SHUNTS MV Vmean:      88.3 cm/s Systemic VTI:  0.14 m                          Systemic Diam: 1.80 cm Lyman Bishop MD Electronically signed by Lyman Bishop MD Signature Date/Time: 07/09/2020/10:04:57 AM    Final     Anti-infectives: Anti-infectives (From admission, onward)   Start     Dose/Rate Route Frequency Ordered Stop   07/10/20 0930  cefTRIAXone (ROCEPHIN) 2 g in sodium chloride 0.9 % 100 mL IVPB        2 g 200 mL/hr over 30 Minutes Intravenous On call to O.R. 07/09/20 1610 07/10/20 1608   07/10/20 0645  cefTRIAXone (ROCEPHIN) 2 g in sodium chloride 0.9 % 100 mL IVPB  Status:  Discontinued        2 g 200 mL/hr over 30 Minutes Intravenous 30 min pre-op 07/10/20 0645 07/10/20 0719   06/28/20 1415  amoxicillin-clavulanate (AUGMENTIN) 500-125 MG per tablet 500 mg  Status:  Discontinued        1 tablet Oral 2 times daily 06/28/20 1316 07/03/20 1553   06/24/20 2200  piperacillin-tazobactam (ZOSYN) IVPB 3.375 g  Status:  Discontinued        3.375 g 12.5 mL/hr over 240 Minutes Intravenous Every 8 hours 06/24/20 2056 06/28/20 1316   06/22/20 0911  sodium chloride 0.9 % with cefoTEtan (CEFOTAN) ADS Med  Note to Pharmacy: Georgena Spurling   : cabinet override      06/22/20 0911 06/22/20 1020   06/21/20 1115  cefoTEtan (CEFOTAN) 2 g in sodium chloride 0.9 % 100 mL IVPB        2 g 200 mL/hr over 30 Minutes Intravenous On call to O.R. 06/20/20 1346 06/22/20 0559   06/20/20 2200  amoxicillin (AMOXIL) capsule 500 mg  Status:  Discontinued        500 mg Oral Every 8 hours 06/20/20 1950 06/24/20 0725   06/14/20 1000  amoxicillin (AMOXIL) capsule 500 mg        500 mg Oral Every 12 hours 06/14/20 0741 06/19/20 0959   06/11/20 1030  cefTRIAXone (ROCEPHIN) 1 g in sodium chloride 0.9 % 100 mL IVPB   Status:  Discontinued        1 g 200 mL/hr over 30 Minutes Intravenous Every 24 hours 06/11/20 1021 06/14/20 0741   06/11/20 0300  cefTRIAXone (ROCEPHIN) 1 g in sodium chloride 0.9 % 100 mL IVPB        1 g 200 mL/hr over 30 Minutes Intravenous  Once 06/11/20 0259 06/11/20 0458       Assessment/Plan SIRS secondary to UTI/GI bleed Tachy-Brady syndrome - PTVP 11/2014 Severe MR s/p repair with 28 mm annuloplasty ring/oversew LAA, 2003 Hx NSVT/chronic atrial fibrillation-not on anticoagulation secondary to GI bleed Hx CHF Hx pulmonary hypertension Anemia -H/H 7.6/26.3 Hx CKD with AKI  Hx gastritis Hx recent balloon dilatation [name] Hx spinal stenosis Hx hypertension/hypertension-on midodrine at home Hydronephrosis with cystoscopy/left retrograde pyelogram/insertion of left double-J stent 06/11/2020;Dr. 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 Dionne Ano: adenocarcinoma - CT chest with possible metastatic lesion in the right lung apex  - [id number] (8/3) - S/p Lap Right Hemicolectomy 8/5 Dr Marlou Starks - Path: Invasive moderately differentiated mucinous adenocarcinoma. - Med Onc has seen here in the hospital and reports they have arranged outpt follow up -Postop ileus resolved; toleratingsoftdietand having bowel function -Wound open 8/16>> CT scan shows nosigns of dehiscence -Wound VAC placement midline incision 07/03/2020 - Mobilize, Pulm toilet  - PT/OT, recommending SNF  AOZ:HYQM diet ID: No further abx indicated from a general surgery standpoint. Zosyn 8/8-8/12 for PNA, TRHAugmentin 8/12-8/17 for UTI  Plan:  She is ready for discharge from our standpoint.  She has follow up in the AVS. Continue VAC. General surgery will sign off at this time. Please call with questions or concerns.  LOS: 29 days    Norm Parcel ,  [facility] Surgery 07/10/2020, 4:26 PM Please see [place] for pager number during day hours 7:00am-4:30pm

## 2020-07-10 NOTE — Interval H&P Note (Signed)
History and Physical Interval Note: No change.  Will proceed with URS today.   07/10/2020 10:54 AM  [name]  has presented today for surgery, with the diagnosis of LEFT URETERAL AND RENAL STONE.  The various methods of treatment have been discussed with the patient and family. After consideration of risks, benefits and other options for treatment, the patient has consented to  Procedure(s): LEFT URETEROSCOPY/HOLMIUM LASER/STENT EXCHANGE (Left) as a surgical intervention.  [name] history has been reviewed, patient examined, no change in status, stable for surgery.  I have reviewed the patient's chart and labs.  Questions were answered to the patient's satisfaction.     Irine Seal

## 2020-07-10 NOTE — Progress Notes (Signed)
Dr Grandville Silos came to bedside,assessed pt,  ordered to give Morning Lasix and and midodrine.

## 2020-07-10 NOTE — TOC Progression Note (Signed)
Transition of Care [facility]) - Progression Note    Patient Details  Name: [name] MRN: [id number] Date of Birth: Oct 05, 1926  [facility] CM/SW Contact  Joaquin Courts, RN Phone Number: 07/10/2020, 11:20 AM  Clinical Narrative:    CM confirmed [facility] will have a bed available for patient tomorrow.  [facility] notified of plan to dc to snf, auth ID [id number].  Patient will need an updated covid test to admit to snf.    Expected Discharge Plan: [place] Barriers to Discharge: Continued Medical Work up  Expected Discharge Plan and Services Expected Discharge Plan: [place]   Discharge Planning Services: CM Consult Post Acute Care Choice: [place] Living arrangements for the past 2 months: [place]                                       Social Determinants of Health (SDOH) Interventions    Readmission Risk Interventions No flowsheet data found.

## 2020-07-11 ENCOUNTER — Encounter (HOSPITAL_COMMUNITY): Payer: Self-pay | Admitting: Urology

## 2020-07-11 LAB — BASIC METABOLIC PANEL
Anion gap: 11 (ref 5–15)
BUN: 47 mg/dL — ABNORMAL HIGH (ref 8–23)
CO2: 20 mmol/L — ABNORMAL LOW (ref 22–32)
Calcium: 7.2 mg/dL — ABNORMAL LOW (ref 8.9–10.3)
Chloride: 114 mmol/L — ABNORMAL HIGH (ref 98–111)
Creatinine, Ser: 1.86 mg/dL — ABNORMAL HIGH (ref 0.44–1.00)
GFR calc Af Amer: 27 mL/min — ABNORMAL LOW (ref >60)
GFR calc non Af Amer: 23 mL/min — ABNORMAL LOW (ref >60)
Glucose, Bld: 80 mg/dL (ref 70–99)
Potassium: 4.1 mmol/L (ref 3.5–5.1)
Sodium: 145 mmol/L (ref 135–145)

## 2020-07-11 LAB — CBC
HCT: 25.5 % — ABNORMAL LOW (ref 36.0–46.0)
Hemoglobin: 7.4 g/dL — ABNORMAL LOW (ref 12.0–15.0)
MCH: 26.9 pg (ref 26.0–34.0)
MCHC: 29 g/dL — ABNORMAL LOW (ref 30.0–36.0)
MCV: 92.7 fL (ref 80.0–100.0)
Platelets: 142 10*3/uL — ABNORMAL LOW (ref 150–400)
RBC: 2.75 MIL/uL — ABNORMAL LOW (ref 3.87–5.11)
RDW: 23.9 % — ABNORMAL HIGH (ref 11.5–15.5)
WBC: 6.9 10*3/uL (ref 4.0–10.5)
nRBC: 0 % (ref 0.0–0.2)

## 2020-07-11 LAB — GLUCOSE, CAPILLARY: Glucose-Capillary: 81 mg/dL (ref 70–99)

## 2020-07-11 LAB — LACTIC ACID, PLASMA: Lactic Acid, Venous: 1.3 mmol/L (ref 0.5–1.9)

## 2020-07-11 LAB — SURGICAL PATHOLOGY

## 2020-07-11 NOTE — Progress Notes (Signed)
Pt was given flutter valve per MD order.

## 2020-07-11 NOTE — Progress Notes (Addendum)
Occupational Therapy Treatment Patient Details Name: [name] MRN: [id number] DOB: 1926/03/17 Today's Date: 07/11/2020    History of present illness 86 yo female admitted with UTI SIRS. S/P cystoscopy, L double J stent placement 06/11/20. S/P R colectomy 06/22/20. Hx of Afib, CKD, anemia, CHF, NSVT, pacemaker, chronic back pain, spinal stenosis, orthostatic hypotension. Wound vac placed 8/17   OT comments  Patient agreeable to therapy. Patient mod assist to transfer to side of bed and then reports needing to have BM. Patient min assist to stand from bed and min guard to transfer to [facility] and then to recliner. Attempted to get patient to assist with toileting but patient unable to reach area needing to be cleaned. Patient exempts mild dyspnea throughout evaluation but o2 sats maintained at 95-97% throughout treatment. Patient's progress has been limited by wound vac placement and persistent bowel movements during therapy. Hopefully will progress now that stools have improved and ileus resolved. Cont POC to progress patient's goals.   Follow Up Recommendations  SNF    Equipment Recommendations  None recommended by OT    Recommendations for Other Services      Precautions / Restrictions Precautions Precautions: Fall Precaution Comments: wound vac Restrictions Weight Bearing Restrictions: No       Mobility Bed Mobility Overal bed mobility: Needs Assistance Bed Mobility: Supine to Sit Rolling: Mod assist Sidelying to sit: Mod assist;HOB elevated       General bed mobility comments: minimal assistance for LEs to guide to edge of bed and lifting and assistance for trunk lift off with verbal cues log roll technique. Patient used bed rails for assistance.  Transfers Overall transfer level: Needs assistance Equipment used: Rolling walker (2 wheeled) Transfers: Sit to/from Stand Sit to Stand: Min assist Stand pivot transfers: Min guard       General transfer comment: min guard  for transfer to [facility] with RW and then to recliner with RW. Small shuffling steps.    Balance Overall balance assessment: Mild deficits observed, not formally tested                                         ADL either performed or assessed with clinical judgement   ADL Overall ADL's : Needs assistance/impaired                         Toilet Transfer: Min guard;BSC;Stand-pivot;RW   Toileting- Clothing Manipulation and Hygiene: Total assistance;Sit to/from stand Toileting - Clothing Manipulation Details (indicate cue type and reason): total A for peri care after bowel movement, patient has difficulty reaching to perform thoroughly              Vision       Perception     Praxis      Cognition Arousal/Alertness: Awake/alert Behavior During Therapy: WFL for tasks assessed/performed Overall Cognitive Status: History of cognitive impairments - at baseline                                 General Comments: decreased memeory        Exercises     Shoulder Instructions       General Comments      Pertinent Vitals/ Pain       Pain Assessment: No/denies pain  Home Living  Prior Functioning/Environment              Frequency  Min 2X/week        Progress Toward Goals  OT Goals(current goals can now be found in the care plan section)  Progress towards OT goals: Not progressing toward goals - comment (treatments limited due to persistent BMs, abdominal size limiting toileting independence and wound vac placement.)  Acute Rehab OT Goals Patient Stated Goal: to get stronger OT Goal Formulation: With patient Time For Goal Achievement: 08/01/20 Potential to Achieve Goals: [place] Discharge plan remains appropriate    Co-evaluation                 AM-PAC OT "6 Clicks" Daily Activity     Outcome Measure   Help from another person eating meals?:  None Help from another person taking care of personal grooming?: A Little Help from another person toileting, which includes using toliet, bedpan, or urinal?: Total Help from another person bathing (including washing, rinsing, drying)?: A Lot Help from another person to put on and taking off regular upper body clothing?: A Little Help from another person to put on and taking off regular lower body clothing?: A Lot 6 Click Score: 15    End of Session Equipment Utilized During Treatment: Rolling walker  OT Visit Diagnosis: Other abnormalities of gait and mobility (R26.89);Other symptoms and signs involving cognitive function   Activity Tolerance Patient tolerated treatment well   Patient Left in chair;with call bell/phone within reach;with chair alarm set   Nurse Communication  (okay to see per RN, Wound vac alarm)        Time: 6314-9702 OT Time Calculation (min): 24 min  Charges: OT General Charges $OT Visit: 1 Visit OT Treatments $Self Care/Home Management : 23-37 mins  Reakwon Barren, OTR/L [place]  Office [phone] Pager: [place] 07/11/2020, 5:03 PM

## 2020-07-11 NOTE — Progress Notes (Signed)
PROGRESS NOTE   [name]  QVZ:[id number]    DOB: 12-02-1925    DOA: 06/11/2020  PCP: [name], MD   I have briefly reviewed patients previous medical records in [facility].  Chief complaint: Abdominal pain.  Brief Narrative:  86 year old female with PMH of tachybradycardia syndrome s/p biventricular pacemaker placement 2007, severe MR s/p repair, chronic atrial fibrillation not on anticoagulation due to GI bleed, abnormal esophageal motility consistent with presbyesophagus, gastritis, s/p balloon dilatation by GI, pulmonary hypertension, admitted with complaints of abdominal pain, black stools and increased confusion.  Admitted for sepsis due to UTI in the setting of obstructive uropathy, acute on chronic anemia.  S/p ureteral stent placement by urology on 7/26 due to left-sided moderate hydronephrosis.  CT abdomen showed cecal mass concerning for malignancy.  S/p colonoscopy 8/2 that showed large cecal mass, adenocarcinoma by pathology.  After cardiac clearance, she underwent surgery on 06/21/2020.  Hospital course complicated by probable ileus and multiple dark watery stools.  Diet advanced.  Wound VAC placed over base of incision, had some oozing at a subcutaneous hematoma 8/16.   Assessment & Plan:  Principal Problem:   Acute lower UTI Active Problems:   Liver cirrhosis (HCC)   CKD (chronic kidney disease) stage 3, GFR 30-59 ml/min   Anemia   Biventricular cardiac pacemaker in situ   Permanent atrial fibrillation (HCC)   Chronic diastolic CHF (congestive heart failure) (HCC)   Gout   S/P MVR (mitral valve repair)   AKI (acute kidney injury) ([place])   Orthostatic hypotension dysautonomic syndrome (HCC)   GI bleeding   Obstructive uropathy   Sepsis secondary to UTI ([place])   Hydronephrosis of left kidney   Left ureteral stone   Advanced care planning/counseling discussion   Goals of care, counseling/discussion   Palliative care by specialist   DNR (do not  resuscitate)   Acute on chronic renal insufficiency   Iron deficiency anemia due to chronic blood loss   Cancer of cecum (HCC)   Emesis   Gastritis with hemorrhage   Acute on chronic diastolic CHF (congestive heart failure) ([place])   Ileus (HCC)  Sepsis secondary to lactobacillus UTI complicating obstructive uropathy: Met sepsis criteria on admission.  Has completed course of multiple IV and oral antibiotics.  Also status post urology intervention for hydronephrosis.  Sepsis resolved.  Obstructive uropathy with left-sided moderate hydronephrosis secondary to left ureteral stone: Urology consulted and s/p stent placement 06/11/2020.  On 8/24, underwent cystoscopy with left ureteral stent removal, left ureteroscopy stone extraction and insertion of double-J stent.  Management per urology.  Possible aspiration pneumonia: Due to nausea and vomiting.  Completed antibiotic course.  Recommend outpatient follow-up of chest x-ray to ensure resolution of pneumonia findings.  Cecal adenocarcinoma: Soft tissue fullness noted in cecal area on CT abdomen and pelvis.  Repeat CT abdomen and pelvis showing a lobulated mass in the cecum concerning for malignancy.  GI consulted, s/p colonoscopy with biopsy on 06/18/2020 per Dr. Michail Sermon with pathology positive for adenocarcinoma.  CT chest with interval development of 10 mm subpleural rounded density seen posteriorly in the right lung apex.  Follow-up unenhanced chest CT in 3 months recommended to ensure stability or resolution.  Patient s/p laparoscopic right colectomy 06/22/2020 after preop cardiac clearance. Oncology consulted and are following and will evaluate postop to determine if further adjuvant treatment is feasible or warranted.  Palliative care also following.    Postop ileus resolved.  Currently tolerating a soft diet.  Will  likely follow-up with oncology in the outpatient setting.  Appreciate general surgery, oncology, palliative care input and  recommendations.  Fascial deficit s/p hematoma removal from base of abdominal wound 07/02/2020: Followed by [facility] RN and general surgery.  Wound VAC per general surgery.  Postop ileus versus SBO: Resolved and tolerating diet.  Acute on chronic combined systolic and diastolic CHF/anasarca: Cardiology assisting.  Discussed with Dr. Alda Lea 8/25.  Echo 8/23: LVEF 40-45%.  BP soft.  Still has significant volume over load but soft blood pressures limiting IV diuresis.  As per cardiology IV Lasix as BP allows.  Chronic hypotension: Asymptomatic.  Continue midodrine.  Limiting diuresis as discussed with cardiology 8/25.  As per RN, manual BPs with SBP in the 90s.  Permanent atrial fibrillation: Beta-blockers discontinued due to hypotension concern and due to family concern regarding this.  Not anticoagulation candidate due to GI bleed history.  Cardiology following.  Tachybradycardia syndrome s/p BiV PPM/nonsustained V. tach: Last interrogation April 2021 noted to be V pacing.  Has had bouts of NSVT, last on 8/23-8 beats.  Beta-blockers discontinued due to hypotension on midodrine.  Acute kidney injury complicating stage IIIb CKD: Creatinine had peaked to 3 which is now stable in the 1.8 range for the last 2 days.  Follow BMP closely.  Hypernatremia: Transient and resolved for the last 2 days.  Iron deficiency anemia secondary to colon cancer bleed/anemia of chronic disease: Status post recent EGD showing gastritis and presbyesophagus.  Baseline hemoglobin approximately 9.  On admission hemoglobin noted to be 7.9.  Status post 1 unit transfusion packed red blood cells.  Status post IV Feraheme x2 (06/24/2020, 06/30/2020). Transfusion threshold hemoglobin < 7.  Hemoglobin stable in the low 7 g range.  Vitamin D deficiency: Continue Os-Cal and vitamin D3 supplements.  Outpatient follow-up.  Confusion versus sundowning: TSH slightly elevated.  Free T4 WNL.  Ammonia 35.  B12: 362.  Folate 8.6.  No  focal deficits.  Seems to have resolved.   Body mass index is 38.55 kg/m./Obesity  Nutritional Status Nutrition Problem: Inadequate oral intake Etiology: altered GI function (newly diagnosed colon adenocarcimona pending right colectomy) Signs/Symptoms:  (NPO/CL > 5 days) Interventions: Boost Breeze, MVI, Prostat  DVT prophylaxis: SCDs Code Status: Full Family Communication: None at bedside.  Will attempt to reach out later today or tomorrow. Disposition:  Status is: Inpatient  Remains inpatient appropriate because:IV treatments appropriate due to intensity of illness or inability to take PO   Dispo: The patient is from: ALF              Anticipated d/c is to: SNF              Anticipated d/c date is: 2 days              Patient currently is not medically stable to d/c.        Consultants:   General surgery Cardiology GI Urology Oncology Palliative care  Procedures:    Colonoscopy 06/18/2020--per Dr. Michail Sermon gastroenterology  Cystoscopy with left retrograde pyelogram and interpretation/cystoscopy with insertion of left double-J stent per Dr.Wrenn 06/11/2020  Transfusion 1 unit packed red blood cells 06/11/2020  Laparoscopic right colectomy by Dr. Marlou Starks III 06/22/2020  Evacuation of subcutaneous hematoma per general surgery 07/02/2020  Antimicrobials:   Completed course as per Dr. Biagio Borg note from 8/24.   Subjective:  Patient interviewed and examined along with RN at bedside.  Patient reports that she feels "fine".  Denied dyspnea or any other complaints.  Objective:   Vitals:   07/11/20 1018 07/11/20 1100 07/11/20 1400 07/11/20 1652  BP:  (!) 92/42 (!) 93/52   Pulse: 72  72   Resp:   18   Temp:   97.7 F (36.5 C)   TempSrc:   Oral   SpO2: 98%  98% 96%  Weight: 95.6 kg     Height:        General exam: Pleasant elderly female, moderately built and nourished lying comfortably propped up in bed without distress. Respiratory system: Slightly diminished  breath sounds in the bases with few fine basal crackles. Respiratory effort normal.  No wheezing or rhonchi appreciated. Cardiovascular system: S1 & S2 heard, RRR. No JVD, murmurs, rubs, gallops or clicks.  2-3+ bilateral leg edema and pitting bilateral upper extremity edema.  Telemetry personally reviewed: V paced rhythm and 8 beat NSVT. Gastrointestinal system: Abdomen is nondistended, soft and nontender. No organomegaly or masses felt. Normal bowel sounds heard. Central nervous system: Alert and oriented x2. No focal neurological deficits.?  Somewhat hard of hearing. Extremities: Symmetric 5 x 5 power. Skin: No rashes, lesions or ulcers Psychiatry: Judgement and insight appear somewhat impaired. Mood & affect appropriate.     Data Reviewed:   I have personally reviewed following labs and imaging studies   CBC: Recent Labs  Lab 07/05/20 0415 07/05/20 0415 07/06/20 0500 07/07/20 0400 07/09/20 0304 07/10/20 0405 07/11/20 0417  WBC 7.0   < > 7.0  --   --  7.0 6.9  NEUTROABS 4.2  --  4.6  --   --  4.8  --   HGB 7.3*   < > 7.5*   < > 7.3* 7.3* 7.4*  HCT 26.0*   < > 26.5*   < > 25.7* 25.2* 25.5*  MCV 92.5   < > 92.3  --   --  92.0 92.7  PLT 146*   < > 162  --   --  147* 142*   < > = values in this interval not displayed.    Basic Metabolic Panel: Recent Labs  Lab 07/05/20 0415 07/06/20 0500 07/07/20 1451 07/07/20 1451 07/08/20 0533 07/09/20 0304 07/09/20 2041 07/10/20 0405 07/11/20 0417  NA 146*   < > 145   < > 145   < > 146* 142 145  K 4.0   < > 4.0   < > 3.8   < > 4.0 3.7 4.1  CL 122*   < > 119*   < > 117*   < > 116* 115* 114*  CO2 18*   < > 20*   < > 20*   < > 20* 20* 20*  GLUCOSE 92   < > 115*   < > 85   < > 84 80 80  BUN 22   < > 32*   < > 33*   < > 45* 43* 47*  CREATININE 1.85*   < > 1.70*   < > 1.61*   < > 2.02* 1.81* 1.86*  CALCIUM 7.2*   < > 7.1*   < > 7.3*   < > 7.6* 7.1* 7.2*  MG 1.9  --   --   --   --   --  2.1 2.2  --   PHOS  --   --  2.8  --  3.1  --    --   --   --    < > = values in this interval not displayed.    Liver  Function Tests: Recent Labs  Lab 07/05/20 0415 07/05/20 0415 07/06/20 0500 07/07/20 1451 07/08/20 0533  AST 15  --  16  --   --   ALT 9  --  10  --   --   ALKPHOS 92  --  97  --   --   BILITOT 0.6  --  0.6  --   --   PROT 4.5*  --  4.6*  --   --   ALBUMIN 1.9*   < > 2.0* 2.2* 2.4*   < > = values in this interval not displayed.    CBG: Recent Labs  Lab 07/06/20 1623 07/11/20 1651  GLUCAP 95 81    Microbiology Studies:   Recent Results (from the past 240 hour(s))  Urine Culture     Status: None   Collection Time: 07/02/20 10:57 AM   Specimen: Urine, Catheterized  Result Value Ref Range Status   Specimen Description   Final    URINE, CATHETERIZED Performed at [facility] [address] [place], [place] [address]    Special Requests   Final    NONE Performed at [facility], [place] [address], [place], [place] [address]    Culture   Final    NO GROWTH Performed at [facility], [place] [address] [place], [place] [address]    Report Status 07/03/2020 FINAL  Final     Radiology Studies:  DG CHEST PORT 1 VIEW  Result Date: 07/10/2020 CLINICAL DATA:  Shortness of breath.  Postoperative exam. EXAM: PORTABLE CHEST 1 VIEW COMPARISON:  07/05/2020 FINDINGS: Stable right IJ central venous catheter. Median sternotomy and cardiac valve replacement. Left-sided AICD. Stable cardiomegaly. Atherosclerotic calcification of the aortic knob. Persistent left-sided pleural effusion. Hazy bibasilar opacities, similar to prior. No pneumothorax. IMPRESSION: Persistent left-sided pleural effusion with hazy bibasilar opacities, similar to prior. Electronically Signed   By: Davina Poke D.O.   On: 07/10/2020 13:37   DG C-Arm 1-60 Min-No Report  Result Date: 07/10/2020 Fluoroscopy was utilized by the requesting physician.  No radiographic interpretation.     Scheduled  Meds:   . (feeding supplement) PROSource Plus  30 mL Oral BID BM  . acetaminophen  1,000 mg Oral Q6H  . calcium-vitamin D  1 tablet Oral TID  . feeding supplement  1 Container Oral TID BM  . furosemide  40 mg Intravenous BID  . midodrine  5 mg Oral TID WC  . multivitamin with minerals  1 tablet Oral Daily  . pantoprazole  40 mg Oral Daily  . sodium bicarbonate  650 mg Oral BID  . sodium chloride flush  3 mL Intravenous Q12H  . torsemide  20 mg Oral Daily    Continuous Infusions:     LOS: 30 days     Vernell Leep, MD, [place], [facility]. [facility]    To contact the attending provider between 7A-7P or the covering provider during after hours 7P-7A, please log into the web site [email or web address] and access using universal [place] password for that web site. If you do not have the password, please call the hospital operator.  07/11/2020, 4:57 PM

## 2020-07-11 NOTE — Progress Notes (Signed)
Progress Note  Patient Name: [name] Date of Encounter: 07/11/2020  Primary Cardiologist: [name], MD   Subjective   Received IV lasix 40 mg yesterday, I/Os recorded as +150 but had umeasured UOP.  Stable creatinine (1.8> 1.9), hyponatremia has improved.  Soft BP this AM (82/43), weight is increased (208>2011).  Denies any chest pain, reports dyspnea has improved.  Inpatient Medications    Scheduled Meds:  (feeding supplement) PROSource Plus  30 mL Oral BID BM   acetaminophen  1,000 mg Oral Q6H   calcium-vitamin D  1 tablet Oral TID   Chlorhexidine Gluconate Cloth  6 each Topical Daily   feeding supplement  1 Container Oral TID BM   furosemide  40 mg Intravenous BID   midodrine  5 mg Oral TID WC   multivitamin with minerals  1 tablet Oral Daily   pantoprazole  40 mg Oral Daily   sodium bicarbonate  650 mg Oral BID   sodium chloride flush  3 mL Intravenous Q12H   torsemide  20 mg Oral Daily   Continuous Infusions:  PRN Meds: HYDROmorphone (DILAUDID) injection, levalbuterol, metoprolol tartrate, ondansetron (ZOFRAN) IV, oxyCODONE, sodium chloride flush   Vital Signs    Vitals:   07/10/20 2125 07/11/20 0523 07/11/20 0848 07/11/20 1011  BP: 99/75 (!) 92/38 (!) 85/39 (!) 82/43  Pulse: 70 70 71 71  Resp: 20 20    Temp: 97.8 F (36.6 C) 97.6 F (36.4 C)    TempSrc: Oral Oral    SpO2: 98% 97%    Weight:      Height:        Intake/Output Summary (Last 24 hours) at 07/11/2020 1018 Last data filed at 07/11/2020 1000 Gross per 24 hour  Intake 1430 ml  Output 800 ml  Net 630 ml   Filed Weights   07/07/20 0500 07/08/20 0554 07/10/20 0619  Weight: 93.6 kg 94.4 kg 93.7 kg    Physical Exam   General: Elderly, NAD Neck: unable to assess JVD, RIJ CVC Lungs:expiratory wheezing Cardiovascular: RRR, 2/6 systolic murmur Extremities: Mild 1+ edema.  Neuro: Alert and oriented. No focal deficits. No facial asymmetry. MAE spontaneously. Psych:  Responds to questions appropriately with normal affect.    Labs    Chemistry Recent Labs  Lab 07/05/20 0415 07/05/20 0415 07/06/20 0500 07/07/20 0400 07/07/20 1451 07/07/20 1451 07/08/20 0533 07/09/20 0304 07/09/20 2041 07/10/20 0405 07/11/20 0417  NA 146*   < > 146*   < > 145   < > 145   < > 146* 142 145  K 4.0   < > 3.5   < > 4.0   < > 3.8   < > 4.0 3.7 4.1  CL 122*   < > 119*   < > 119*   < > 117*   < > 116* 115* 114*  CO2 18*   < > 19*   < > 20*   < > 20*   < > 20* 20* 20*  GLUCOSE 92   < > 94   < > 115*   < > 85   < > 84 80 80  BUN 22   < > 26*   < > 32*   < > 33*   < > 45* 43* 47*  CREATININE 1.85*   < > 1.66*   < > 1.70*   < > 1.61*   < > 2.02* 1.81* 1.86*  CALCIUM 7.2*   < > 7.3*   < >  7.1*   < > 7.3*   < > 7.6* 7.1* 7.2*  PROT 4.5*  --  4.6*  --   --   --   --   --   --   --   --   ALBUMIN 1.9*   < > 2.0*  --  2.2*  --  2.4*  --   --   --   --   AST 15  --  16  --   --   --   --   --   --   --   --   ALT 9  --  10  --   --   --   --   --   --   --   --   ALKPHOS 92  --  97  --   --   --   --   --   --   --   --   BILITOT 0.6  --  0.6  --   --   --   --   --   --   --   --   GFRNONAA 23*   < > 27*   < > 26*   < > 28*   < > 21* 24* 23*  GFRAA 27*   < > 31*   < > 30*   < > 32*   < > 24* 28* 27*  ANIONGAP 6   < > 8   < > 6   < > 8   < > 10 7 11    < > = values in this interval not displayed.     Hematology Recent Labs  Lab 07/06/20 0500 07/07/20 0400 07/09/20 0304 07/10/20 0405 07/11/20 0417  WBC 7.0  --   --  7.0 6.9  RBC 2.87*  --   --  2.74* 2.75*  HGB 7.5*   < > 7.3* 7.3* 7.4*  HCT 26.5*   < > 25.7* 25.2* 25.5*  MCV 92.3  --   --  92.0 92.7  MCH 26.1  --   --  26.6 26.9  MCHC 28.3*  --   --  29.0* 29.0*  RDW 22.8*  --   --  24.0* 23.9*  PLT 162  --   --  147* 142*   < > = values in this interval not displayed.    Cardiac EnzymesNo results for input(s): TROPONINI in the last 168 hours. No results for input(s): TROPIPOC in the last 168 hours.    BNP Recent Labs  Lab 07/09/20 2041  BNP 476.5*     DDimer No results for input(s): DDIMER in the last 168 hours.   Radiology    DG CHEST PORT 1 VIEW  Result Date: 07/10/2020 CLINICAL DATA:  Shortness of breath.  Postoperative exam. EXAM: PORTABLE CHEST 1 VIEW COMPARISON:  07/05/2020 FINDINGS: Stable right IJ central venous catheter. Median sternotomy and cardiac valve replacement. Left-sided AICD. Stable cardiomegaly. Atherosclerotic calcification of the aortic knob. Persistent left-sided pleural effusion. Hazy bibasilar opacities, similar to prior. No pneumothorax. IMPRESSION: Persistent left-sided pleural effusion with hazy bibasilar opacities, similar to prior. Electronically Signed   By: Davina Poke D.O.   On: 07/10/2020 13:37   DG C-Arm 1-60 Min-No Report  Result Date: 07/10/2020 Fluoroscopy was utilized by the requesting physician.  No radiographic interpretation.    Telemetry    V-paced at 70 bpm.  NSVT x 8beats - Personally Reviewed  ECG    No  new tracing- Personally Reviewed  Cardiac Studies   Echo 04/05/20:  1. Left ventricular ejection fraction, by estimation, is 40%. The left  ventricle has mildly decreased function. The left ventricle demonstrates  global hypokinesis. The left ventricular internal cavity size was mildly  dilated. Left ventricular diastolic  parameters are indeterminate.  2. Right ventricular systolic function is moderately reduced. The right  ventricular size is moderately enlarged. The estimated right ventricular  systolic pressure is 40.7 mmHg.  3. Left atrial size was severely dilated.  4. Right atrial size was severely dilated.  5. The mitral valve has been repaired/replaced. Mild mitral valve  regurgitation. No evidence of mitral stenosis however Doppler alignment of  diastolic gradient is slightly suboptimal. The mean mitral valve gradient  is 2.0 mmHg with average heart rate of  70 bpm.  6. Tricuspid valve  regurgitation is severe.  7. The aortic valve is tricuspid. Aortic valve regurgitation is mild. No  aortic stenosis is present.  8. The inferior vena cava is dilated in size with <50% respiratory  variability, suggesting right atrial pressure of 15 mmHg.   Patient Profile     86 y.o. female with a hx of chronic combined systolic/diastolic heart failure, tachybrady syndrome status post BiV PPM, severe MR status post MV repair in 2003, CKD stage III, chronic atrial fibrillation not on anticoagulation due to GI bleed, orthostatic hypotension, moderate pulmonary hypertension, severe TR who is being seen today for the evaluation of heart failure at the request of Dr. Grandville Silos.  Assessment & Plan    Acute on chronic combined systolic and diastolic heart failure: Echo 07/09/2020 with LVEF 40 to 45%, mild RV dysfunction, moderate to severe TR, RVSP 45 mmHg.  Weight increased from 194 pounds on admission to 208 pounds 8/22.  BNP 477 on 8/23. -BP remains soft, she appears asymptomatic, will check lactate -Diuresis with IV lasix 40 mg BID as BP allows.    Permanent atrial fibrillation: Not on anticoagulation due to GI bleed history.  HRs well controlled on Toprol 12.5 however this was discontinued per family request due to concern over recent hypotension requiring midodrine.  Remains in V-paced rhythm at 70bpm since metoprolol discontinued.  Tachybrady syndrome: s/p BiV PPM  NSVT: Maintain K>4, Mag>2  AKI on CKD stage III:  Peak creatinine 3, has improved, currently 1.9.  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   -Management per oncology, general surgery, and palliative care    Sepsis: Resolved, due to UTI.  Completed course of Augmentin.  Obstructive uropathy: S/p ureteral stent 06/11/2020. Stent removal 07/10/2020  Hypotension: Chronic issue, on midodrine  For questions or updates, please contact   Please consult [email or web address] for contact info  under Cardiology/STEMI.   Donato Heinz, MD

## 2020-07-12 LAB — CBC
HCT: 28.7 % — ABNORMAL LOW (ref 36.0–46.0)
Hemoglobin: 8.2 g/dL — ABNORMAL LOW (ref 12.0–15.0)
MCH: 26.5 pg (ref 26.0–34.0)
MCHC: 28.6 g/dL — ABNORMAL LOW (ref 30.0–36.0)
MCV: 92.9 fL (ref 80.0–100.0)
Platelets: 150 10*3/uL (ref 150–400)
RBC: 3.09 MIL/uL — ABNORMAL LOW (ref 3.87–5.11)
RDW: 23.9 % — ABNORMAL HIGH (ref 11.5–15.5)
WBC: 5.5 10*3/uL (ref 4.0–10.5)
nRBC: 0 % (ref 0.0–0.2)

## 2020-07-12 LAB — BASIC METABOLIC PANEL
Anion gap: 12 (ref 5–15)
BUN: 53 mg/dL — ABNORMAL HIGH (ref 8–23)
CO2: 19 mmol/L — ABNORMAL LOW (ref 22–32)
Calcium: 7.6 mg/dL — ABNORMAL LOW (ref 8.9–10.3)
Chloride: 115 mmol/L — ABNORMAL HIGH (ref 98–111)
Creatinine, Ser: 2.09 mg/dL — ABNORMAL HIGH (ref 0.44–1.00)
GFR calc Af Amer: 23 mL/min — ABNORMAL LOW (ref >60)
GFR calc non Af Amer: 20 mL/min — ABNORMAL LOW (ref >60)
Glucose, Bld: 104 mg/dL — ABNORMAL HIGH (ref 70–99)
Potassium: 3.9 mmol/L (ref 3.5–5.1)
Sodium: 146 mmol/L — ABNORMAL HIGH (ref 135–145)

## 2020-07-12 LAB — MAGNESIUM: Magnesium: 2.2 mg/dL (ref 1.7–2.4)

## 2020-07-12 MED ORDER — MIDODRINE HCL 5 MG PO TABS
7.5000 mg | ORAL_TABLET | Freq: Three times a day (TID) | ORAL | Status: DC
  Administered 2020-07-13 – 2020-07-14 (×5): 7.5 mg via ORAL
  Filled 2020-07-12 (×5): qty 2

## 2020-07-12 NOTE — Progress Notes (Addendum)
Progress Note  Patient Name: [name] Date of Encounter: 07/12/2020  [facility] Cardiologist: [name], MD   Subjective   No acute overnight events. No significant shortness of breath but patient has not been out of bed. No chest pain. Occasional palpitations ("fluttering") but nothing prolonged. She feels like she is urinating well after ureteral stent removal.  Inpatient Medications    Scheduled Meds: . (feeding supplement) PROSource Plus  30 mL Oral BID BM  . acetaminophen  1,000 mg Oral Q6H  . calcium-vitamin D  1 tablet Oral TID  . feeding supplement  1 Container Oral TID BM  . furosemide  40 mg Intravenous BID  . midodrine  5 mg Oral TID WC  . multivitamin with minerals  1 tablet Oral Daily  . pantoprazole  40 mg Oral Daily  . sodium bicarbonate  650 mg Oral BID  . sodium chloride flush  3 mL Intravenous Q12H   Continuous Infusions:  PRN Meds: HYDROmorphone (DILAUDID) injection, levalbuterol, metoprolol tartrate, ondansetron (ZOFRAN) IV, oxyCODONE, sodium chloride flush   Vital Signs    Vitals:   07/12/20 0506 07/12/20 0511 07/12/20 0926 07/12/20 1100  BP: (!) 91/41  (!) 92/44 (!) 89/65  Pulse: 70  69 72  Resp: 20     Temp: (!) 97.4 F (36.3 C)     TempSrc: Oral     SpO2: 98%     Weight:  95.3 kg    Height:        Intake/Output Summary (Last 24 hours) at 07/12/2020 1150 Last data filed at 07/12/2020 1100 Gross per 24 hour  Intake 840 ml  Output 475 ml  Net 365 ml   Last 3 Weights 07/12/2020 07/11/2020 07/10/2020  Weight (lbs) 210 lb 1.6 oz 210 lb 12.2 oz 206 lb 9.1 oz  Weight (kg) 95.301 kg 95.6 kg 93.7 kg      Telemetry    V paced rhythm. Frequent PVC and multiple short runs of NSVT (longest run being 10 beats on 8/25). - Personally Reviewed  ECG    No new ECG tracing. - Personally Reviewed  Physical Exam   GEN: No acute distress.   Neck: + JVD Cardiac: RRR. Possible soft systolic murmur but difficult to assess over lung sounds.  No rubs or gallops.  Respiratory: No significant increased work of breathing. Faint bibasilar crackles and expiratory wheezes throughout. GI: Soft, non-distended, and non-tender to light palpation. Bowel sounds present. MS: 1+ pitting edema bilaterally. No deformity. Neuro:  No focal deficits. Psych: Normal affect. Responds appropriately.  Labs    High Sensitivity Troponin:  No results for input(s): TROPONINIHS in the last 720 hours.    Chemistry Recent Labs  Lab 07/06/20 0500 07/07/20 0400 07/07/20 1451 07/07/20 1451 07/08/20 0533 07/09/20 0304 07/10/20 0405 07/11/20 0417 07/12/20 0840  NA 146*   < > 145   < > 145   < > 142 145 146*  K 3.5   < > 4.0   < > 3.8   < > 3.7 4.1 3.9  CL 119*   < > 119*   < > 117*   < > 115* 114* 115*  CO2 19*   < > 20*   < > 20*   < > 20* 20* 19*  GLUCOSE 94   < > 115*   < > 85   < > 80 80 104*  BUN 26*   < > 32*   < > 33*   < >  43* 47* 53*  CREATININE 1.66*   < > 1.70*   < > 1.61*   < > 1.81* 1.86* 2.09*  CALCIUM 7.3*   < > 7.1*   < > 7.3*   < > 7.1* 7.2* 7.6*  PROT 4.6*  --   --   --   --   --   --   --   --   ALBUMIN 2.0*  --  2.2*  --  2.4*  --   --   --   --   AST 16  --   --   --   --   --   --   --   --   ALT 10  --   --   --   --   --   --   --   --   ALKPHOS 97  --   --   --   --   --   --   --   --   BILITOT 0.6  --   --   --   --   --   --   --   --   GFRNONAA 27*   < > 26*   < > 28*   < > 24* 23* 20*  GFRAA 31*   < > 30*   < > 32*   < > 28* 27* 23*  ANIONGAP 8   < > 6   < > 8   < > 7 11 12    < > = values in this interval not displayed.     Hematology Recent Labs  Lab 07/10/20 0405 07/11/20 0417 07/12/20 0840  WBC 7.0 6.9 5.5  RBC 2.74* 2.75* 3.09*  HGB 7.3* 7.4* 8.2*  HCT 25.2* 25.5* 28.7*  MCV 92.0 92.7 92.9  MCH 26.6 26.9 26.5  MCHC 29.0* 29.0* 28.6*  RDW 24.0* 23.9* 23.9*  PLT 147* 142* 150    BNP Recent Labs  Lab 07/09/20 2041  BNP 476.5*     DDimer No results for input(s): DDIMER in the last 168 hours.    Radiology    DG CHEST PORT 1 VIEW  Result Date: 07/10/2020 CLINICAL DATA:  Shortness of breath.  Postoperative exam. EXAM: PORTABLE CHEST 1 VIEW COMPARISON:  07/05/2020 FINDINGS: Stable right IJ central venous catheter. Median sternotomy and cardiac valve replacement. Left-sided AICD. Stable cardiomegaly. Atherosclerotic calcification of the aortic knob. Persistent left-sided pleural effusion. Hazy bibasilar opacities, similar to prior. No pneumothorax. IMPRESSION: Persistent left-sided pleural effusion with hazy bibasilar opacities, similar to prior. Electronically Signed   By: Davina Poke D.O.   On: 07/10/2020 13:37   DG C-Arm 1-60 Min-No Report  Result Date: 07/10/2020 Fluoroscopy was utilized by the requesting physician.  No radiographic interpretation.    Cardiac Studies   Echocardiogram 07/09/2020: Impressions: 1. Left ventricular ejection fraction, by estimation, is 40 to 45%. The  left ventricle has mildly decreased function. The left ventricle  demonstrates global hypokinesis. Left ventricular diastolic function could  not be evaluated.  2. Right ventricular systolic function is mildly reduced. The right  ventricular size is mildly enlarged. There is moderately elevated  pulmonary artery systolic pressure. The estimated right ventricular  systolic pressure is 14.4 mmHg.  3. Left atrial size was severely dilated.  4. Right atrial size was severely dilated.  5. The mitral valve has been repaired/replaced. Trivial mitral valve  regurgitation. The mean mitral valve gradient is 4.0 mmHg with average  heart rate of  70 bpm. There is a 28 mm prosthetic annuloplasty ring  present in the mitral position.  6. The pericardial effusion is posterior to the left ventricle. Moderate  pleural effusion in the left lateral region.  7. The tricuspid valve is abnormal. Tricuspid valve regurgitation is  moderate to severe.  8. The aortic valve is tricuspid. Mild aortic valve  sclerosis is present,  with no evidence of aortic valve stenosis.  9. The inferior vena cava is dilated in size with <50% respiratory  variability, suggesting right atrial pressure of 15 mmHg.   Comparison(s): Changes from prior study are noted. 04/05/20: LVEF 40%,  severe TR.   Patient Profile     86 y.o. female with a history of chronic combined systolic/diastolic heart failure,tachybradysyndrome status post BiV PPM, severe MR status post MV repair in 2003, CKD stage III, chronic atrial fibrillation not on anticoagulation due to GI bleed,orthostatichypotension, moderate pulmonary hypertension,severe TR who is being seen today for the evaluation ofheart failureat the request of Dr. Grandville Silos.  Assessment & Plan    Acute on Chronic Combined CHF - BNP elevated at 477. - Echo showed LVEF of 40-45%, mild RV dysfunction, moderate to severe TR, RVSP 45 mmHg.  - Diuresis being limited by hypotension. Net positive 5.5 L since admission. Only 350 cc of urine output documented over the last 24 hours. Weight 210 lbs today, up from 197 on admission. Creatinine up from 1.86 yesterday to 2.09 today. - Continue IV Lasix 40mg  twice daily as BP allows (hold for MAP <60). Patient was also on PO Torsemide so I discontinued this today.  - Continue to monitor daily weights, strict I/O's, and renal function.  Permanent Atrial Fibrillation - Heart rates well controlled. - Was on Toprol-XL 12.5mg  daily but family requested this be stopped due to hypotension.  - Continue to monitor on telemetry.  Non-Sustained VT - Patient having frequent PVC and short runs of NSVT (longest run being 10 beats on 8/25). - Potassium 3.9. Goal >4.0. Supplement as needed. - Magnesium 2.2 on 8/24. Goal >2.0. Will recheck today. Supplement as needed. - Was on Toprol-XL 12.5mg  daily but family requested this be stopped due to hypotension.  - Continue to monitor on telemetry.  Severe TR - Noted on Echo this admission. -  Continue to treat conservatively at this time.  Tachybrady Syndrome  - s/p BiV PPM.  Hypotension - Chronic issues. - On Midodrine.  Acute on CKD Stage III - Creatinine 3.16 on admission. Gradually improved but started to rise again. 2.09 today (up from 1.86 yesterday).  - Continue to monitor closely.  Otherwise, per primary team: - Cecal adenocarcinoma: s/p right colectomy on 8/6/22021 - Sepsis secondary to UTI: resolved, completed course of Augmentin - Obstructive uropathy: s/p ureteral stent on 06/11/2020 and stent removal on 07/10/2020 - Post-op ileus vs small bowel obstruction: resolved - Chronic anemia - Hypernatremia  For questions or updates, please contact [facility] Please consult [email or web address] for contact info under        Signed, Darreld Mclean, PA-C  07/12/2020, 11:50 AM    Patient seen and examined.  Agree with above documentation.  On exam, patient is alert, regular rate and rhythm, no murmurs, lung exam with improvement in wheezing, 1+ LE edema , + JVD.  I/Os recorded as +600 cc yesterday, but did have unmeasured UOP.  Worsening renal function today (creatinine 1.9 > 2.1), hypernatremia (146).  Weight is 210 pounds, up from 194 pounds on admission.  Suspect she has significant  volume for diuresis.  This is been limited by her low BP.  Recommend increasing midodrine to 7.5 mg 3 times daily and diurese as BP tolerates with IV lasix 40 mg twice daily.  Addendum: Most recent BP in 150s, will hold off on increasing midodrine, continue IV diuresis.  Donato Heinz, MD

## 2020-07-12 NOTE — Progress Notes (Signed)
Per Dr. Gardiner Rhyme, ok to give IV Lasix as long as patients MAP is greater than 60.

## 2020-07-12 NOTE — Progress Notes (Signed)
Physical Therapy Treatment Patient Details Name: [name] MRN: [id number] DOB: 12/02/1925 Today's Date: 07/12/2020    History of Present Illness 86 yo female admitted with UTI SIRS. S/P cystoscopy, L double J stent placement 06/11/20. S/P R colectomy 06/22/20. Hx of Afib, CKD, anemia, CHF, NSVT, pacemaker, chronic back pain, spinal stenosis, orthostatic hypotension. Wound vac placed 8/17    PT Comments    Pt demonstrating significant LE weakness today, requiring mod assist form PT to safely reach recliner via small steps with LE buckling noted. Pt performed LE exercises to address LE weakness well. Pt continues to present with dyspnea on exertion, SpO2 WFL and recovers well with rest. PT to continue to follow acutely.    Follow Up Recommendations  SNF     Equipment Recommendations  None recommended by PT    Recommendations for Other Services       Precautions / Restrictions Precautions Precautions: Fall Precaution Comments: wound vac Restrictions Weight Bearing Restrictions: No    Mobility  Bed Mobility Overal bed mobility: Needs Assistance Bed Mobility: Rolling;Sidelying to Sit Rolling: Min assist Sidelying to sit: Mod assist       General bed mobility comments: min assist to roll to R for trunk translation, mod asist to sit up for trunk and LE management, pt able to scoot to EOB without PT assist and increased time.  Transfers Overall transfer level: Needs assistance Equipment used: Rolling walker (2 wheeled) Transfers: Sit to/from Stand Sit to Stand: From elevated surface;Mod assist         General transfer comment: Mod assist for power up, support with rising, and steadying upon standing. VErbal cuing for hand placement.  Ambulation/Gait Ambulation/Gait assistance: Mod assist Gait Distance (Feet): 3 Feet Assistive device: Rolling walker (2 wheeled) Gait Pattern/deviations: Decreased stride length;Step-through pattern;Trunk flexed Gait velocity: decr    General Gait Details: Mod assist to steady, support pt trunk. Pt with bilateral LE weakness L>R with buckling noted, required PT assist to safely lower into recliner.   Stairs             Wheelchair Mobility    Modified Rankin (Stroke Patients Only)       Balance Overall balance assessment: Needs assistance Sitting-balance support: Feet supported;No upper extremity supported Sitting balance-Leahy Scale: Fair     Standing balance support: Bilateral upper extremity supported Standing balance-Leahy Scale: Poor Standing balance comment: reliant on RW                            Cognition Arousal/Alertness: Awake/alert Behavior During Therapy: WFL for tasks assessed/performed Overall Cognitive Status: History of cognitive impairments - at baseline                                 General Comments: memory difficulties      Exercises General Exercises - Lower Extremity Ankle Circles/Pumps: AROM;Both;10 reps;Seated Long Arc Quad: AROM;Both;15 reps;Seated Hip ABduction/ADduction: Both;15 reps;AAROM;Seated    General Comments General comments (skin integrity, edema, etc.): cuing for rest breaks to recover dyspnea 2/4, SpO2 96% on RA      Pertinent Vitals/Pain Pain Assessment: Faces Faces Pain Scale: Hurts little more Pain Location: back Pain Descriptors / Indicators: Discomfort;Sore Pain Intervention(s): Limited activity within patient's tolerance;Monitored during session;Repositioned    Home Living  Prior Function            PT Goals (current goals can now be found in the care plan section) Acute Rehab PT Goals PT Goal Formulation: With patient Time For Goal Achievement: 07/26/20 Potential to Achieve Goals: Fair Progress towards PT goals: Progressing toward goals    Frequency    Min 2X/week      PT Plan Current plan remains appropriate    Co-evaluation              AM-PAC PT "6 Clicks"  Mobility   Outcome Measure  Help needed turning from your back to your side while in a flat bed without using bedrails?: A Little Help needed moving from lying on your back to sitting on the side of a flat bed without using bedrails?: A Lot Help needed moving to and from a bed to a chair (including a wheelchair)?: A Lot Help needed standing up from a chair using your arms (e.g., wheelchair or bedside chair)?: A Little Help needed to walk in hospital room?: A Lot Help needed climbing 3-5 steps with a railing? : Total 6 Click Score: 13    End of Session   Activity Tolerance: Patient limited by fatigue Patient left: in chair;with call bell/phone within reach;with chair alarm set Nurse Communication: Mobility status;Need for lift equipment (use stedy for back to bed) PT Visit Diagnosis: Difficulty in walking, not elsewhere classified (R26.2);Muscle weakness (generalized) (M62.81)     Time: 9774-1423 PT Time Calculation (min) (ACUTE ONLY): 24 min  Charges:  $Therapeutic Exercise: 8-22 mins $Therapeutic Activity: 8-22 mins                    Jamahl Lemmons E, PT Acute Rehabilitation Services Pager [phone]  Office [phone]  Tyller Bowlby D Ad Guttman 07/12/2020, 1:17 PM

## 2020-07-12 NOTE — Progress Notes (Signed)
PROGRESS NOTE   [name]  QVZ:[id number]    DOB: 12-02-1925    DOA: 06/11/2020  PCP: [name], MD   I have briefly reviewed patients previous medical records in [facility].  Chief complaint: Abdominal pain.  Brief Narrative:  86 year old female with PMH of tachybradycardia syndrome s/p biventricular pacemaker placement 2007, severe MR s/p repair, chronic atrial fibrillation not on anticoagulation due to GI bleed, abnormal esophageal motility consistent with presbyesophagus, gastritis, s/p balloon dilatation by GI, pulmonary hypertension, admitted with complaints of abdominal pain, black stools and increased confusion.  Admitted for sepsis due to UTI in the setting of obstructive uropathy, acute on chronic anemia.  S/p ureteral stent placement by urology on 7/26 due to left-sided moderate hydronephrosis.  CT abdomen showed cecal mass concerning for malignancy.  S/p colonoscopy 8/2 that showed large cecal mass, adenocarcinoma by pathology.  After cardiac clearance, she underwent surgery on 06/21/2020.  Hospital course complicated by probable ileus and multiple dark watery stools.  Diet advanced.  Wound VAC placed over base of incision, had some oozing at a subcutaneous hematoma 8/16.   Assessment & Plan:  Principal Problem:   Acute lower UTI Active Problems:   Liver cirrhosis (HCC)   CKD (chronic kidney disease) stage 3, GFR 30-59 ml/min   Anemia   Biventricular cardiac pacemaker in situ   Permanent atrial fibrillation (HCC)   Chronic diastolic CHF (congestive heart failure) (HCC)   Gout   S/P MVR (mitral valve repair)   AKI (acute kidney injury) ([place])   Orthostatic hypotension dysautonomic syndrome (HCC)   GI bleeding   Obstructive uropathy   Sepsis secondary to UTI ([place])   Hydronephrosis of left kidney   Left ureteral stone   Advanced care planning/counseling discussion   Goals of care, counseling/discussion   Palliative care by specialist   DNR (do not  resuscitate)   Acute on chronic renal insufficiency   Iron deficiency anemia due to chronic blood loss   Cancer of cecum (HCC)   Emesis   Gastritis with hemorrhage   Acute on chronic diastolic CHF (congestive heart failure) ([place])   Ileus (HCC)  Sepsis secondary to lactobacillus UTI complicating obstructive uropathy: Met sepsis criteria on admission.  Has completed course of multiple IV and oral antibiotics.  Also status post urology intervention for hydronephrosis.  Sepsis resolved.  Obstructive uropathy with left-sided moderate hydronephrosis secondary to left ureteral stone: Urology consulted and s/p stent placement 06/11/2020.  On 8/24, underwent cystoscopy with left ureteral stent removal, left ureteroscopy stone extraction and insertion of double-J stent.  Management per urology.  Possible aspiration pneumonia: Due to nausea and vomiting.  Completed antibiotic course.  Recommend outpatient follow-up of chest x-ray to ensure resolution of pneumonia findings.  Cecal adenocarcinoma: Soft tissue fullness noted in cecal area on CT abdomen and pelvis.  Repeat CT abdomen and pelvis showing a lobulated mass in the cecum concerning for malignancy.  GI consulted, s/p colonoscopy with biopsy on 06/18/2020 per Dr. Michail Sermon with pathology positive for adenocarcinoma.  CT chest with interval development of 10 mm subpleural rounded density seen posteriorly in the right lung apex.  Follow-up unenhanced chest CT in 3 months recommended to ensure stability or resolution.  Patient s/p laparoscopic right colectomy 06/22/2020 after preop cardiac clearance. Oncology consulted and are following and will evaluate postop to determine if further adjuvant treatment is feasible or warranted.  Palliative care also following.    Postop ileus resolved.  Currently tolerating a soft diet.  Will  likely follow-up with oncology in the outpatient setting.  Appreciate general surgery, oncology, palliative care input and  recommendations.  Fascial deficit s/p hematoma removal from base of abdominal wound 07/02/2020: Followed by [facility] RN and general surgery.  Wound VAC per general surgery.  Postop ileus versus SBO: Resolved and tolerating diet.  Acute on chronic combined systolic and diastolic CHF/anasarca: Cardiology assisting.  Echo 8/23: LVEF 40-45%.  BP soft.  Still has significant volume over load (+5.5 L since admission but not sure if this is accurate and weight up from 197 to 210 pounds since admission) but soft blood pressures limiting IV diuresis.  Discussed with Dr. Alda Lea, cardiology and his input appreciated.  Patient was on both IV Lasix and torsemide.  Torsemide discontinued this morning.  Noted creatinine has worsened from 1.9-2.1 and serum sodium to 146.  Cardiology recommends increasing midodrine to 7.5 mg 3 times daily to improve blood pressure and continue IV diuresis.  However most recent BPs were higher and hence midodrine was not increased.  I am not certain if that single elevated blood pressure was accurate.  Chronic hypotension: Asymptomatic.  Continue midodrine.    Permanent atrial fibrillation: Beta-blockers discontinued due to hypotension concern and due to family concern regarding this.  Not anticoagulation candidate due to GI bleed history.  Heart rates paced and well controlled.  Tachybradycardia syndrome s/p BiV PPM/nonsustained V. tach: Last interrogation April 2021 noted to be V pacing.  Intermittent bouts of NSVT. Beta-blockers discontinued due to hypotension.  Potassium 3.9 and magnesium 2.2.  Severe TR Noted.  Acute kidney injury complicating stage IIIb CKD: Creatinine had peaked to 3 and improved but increased back up from 1.8-2.09.  Follow BMP closely.  Hypernatremia: Intermittent.  Sodium up to 146 today.  Will monitor closely while on IV diuresis..  Iron deficiency anemia secondary to colon cancer bleed/anemia of chronic disease: Status post recent EGD showing  gastritis and presbyesophagus.  Baseline hemoglobin approximately 9.  On admission hemoglobin noted to be 7.9.  Status post 1 unit transfusion packed red blood cells.  Status post IV Feraheme x2 (06/24/2020, 06/30/2020). Transfusion threshold hemoglobin < 7.  Hemoglobin stable in the low 7 g range.  Vitamin D deficiency: Continue Os-Cal and vitamin D3 supplements.  Outpatient follow-up.  Confusion versus sundowning: TSH slightly elevated.  Free T4 WNL.  Ammonia 35.  B12: 362.  Folate 8.6.  No focal deficits.  Seems to have resolved.   Body mass index is 38.43 kg/m./Obesity  Nutritional Status Nutrition Problem: Inadequate oral intake Etiology: altered GI function (newly diagnosed colon adenocarcimona pending right colectomy) Signs/Symptoms:  (NPO/CL > 5 days) Interventions: Boost Breeze, MVI, Prostat  DVT prophylaxis: SCDs Code Status: Full Family Communication: None at bedside.  Discussed with patient's daughter in detail via phone on 8/26, updated care and answered questions.  Encouraged her to discuss again with patient regarding advanced care planning, CODE STATUS etc. Disposition:  Status is: Inpatient  Remains inpatient appropriate because:IV treatments appropriate due to intensity of illness or inability to take PO   Dispo: The patient is from: ALF              Anticipated d/c is to: SNF              Anticipated d/c date is: 2 days              Patient currently is not medically stable to d/c.  Ongoing IV diuresis.        Consultants:  General surgery Cardiology GI Urology Oncology Palliative care  Procedures:    Colonoscopy 06/18/2020--per Dr. Michail Sermon gastroenterology  Cystoscopy with left retrograde pyelogram and interpretation/cystoscopy with insertion of left double-J stent per Dr.Wrenn 06/11/2020  Transfusion 1 unit packed red blood cells 06/11/2020  Laparoscopic right colectomy by Dr. Marlou Starks III 06/22/2020  Evacuation of subcutaneous hematoma per general  surgery 07/02/2020  Antimicrobials:   Completed course as per Dr. Biagio Borg note from 8/24.   Subjective:  Patient denies complaints.  No dyspnea reported.  Hard of hearing.  Objective:   Vitals:   07/12/20 0511 07/12/20 0926 07/12/20 1100 07/12/20 1305  BP:  (!) 92/44 (!) 89/65 (!) 157/139  Pulse:  69 72 70  Resp:    (!) 24  Temp:    (!) 97.5 F (36.4 C)  TempSrc:    Oral  SpO2:    100%  Weight: 95.3 kg     Height:        General exam: Pleasant elderly female, moderately built and nourished lying comfortably propped up in bed without distress. Respiratory system: Slightly diminished breath sounds in the bases with few fine basal crackles. Respiratory effort normal.  No wheezing or rhonchi appreciated. Cardiovascular system: S1 & S2 heard, RRR. No JVD, murmurs, rubs, gallops or clicks.  2-3+ bilateral leg edema and pitting bilateral upper extremity edema.  Telemetry personally reviewed: V paced rhythm and 8 beat NSVT. Gastrointestinal system: Abdomen is nondistended, soft and nontender. No organomegaly or masses felt. Normal bowel sounds heard. Central nervous system: Alert and oriented x2. No focal neurological deficits.?  Somewhat hard of hearing. Extremities: Symmetric 5 x 5 power. Skin: No rashes, lesions or ulcers Psychiatry: Judgement and insight appear somewhat impaired. Mood & affect appropriate.     Data Reviewed:   I have personally reviewed following labs and imaging studies   CBC: Recent Labs  Lab 07/06/20 0500 07/07/20 0400 07/10/20 0405 07/11/20 0417 07/12/20 0840  WBC 7.0  --  7.0 6.9 5.5  NEUTROABS 4.6  --  4.8  --   --   HGB 7.5*   < > 7.3* 7.4* 8.2*  HCT 26.5*   < > 25.2* 25.5* 28.7*  MCV 92.3  --  92.0 92.7 92.9  PLT 162  --  147* 142* 150   < > = values in this interval not displayed.    Basic Metabolic Panel: Recent Labs  Lab 07/07/20 1451 07/07/20 1451 07/08/20 0533 07/09/20 0304 07/09/20 2041 07/09/20 2041 07/10/20 0405  07/11/20 0417 07/12/20 0840  NA 145   < > 145   < > 146*   < > 142 145 146*  K 4.0   < > 3.8   < > 4.0   < > 3.7 4.1 3.9  CL 119*   < > 117*   < > 116*   < > 115* 114* 115*  CO2 20*   < > 20*   < > 20*   < > 20* 20* 19*  GLUCOSE 115*   < > 85   < > 84   < > 80 80 104*  BUN 32*   < > 33*   < > 45*   < > 43* 47* 53*  CREATININE 1.70*   < > 1.61*   < > 2.02*   < > 1.81* 1.86* 2.09*  CALCIUM 7.1*   < > 7.3*   < > 7.6*   < > 7.1* 7.2* 7.6*  MG  --   --   --   --  2.1  --  2.2  --  2.2  PHOS 2.8  --  3.1  --   --   --   --   --   --    < > = values in this interval not displayed.    Liver Function Tests: Recent Labs  Lab 07/06/20 0500 07/07/20 1451 07/08/20 0533  AST 16  --   --   ALT 10  --   --   ALKPHOS 97  --   --   BILITOT 0.6  --   --   PROT 4.6*  --   --   ALBUMIN 2.0* 2.2* 2.4*    CBG: Recent Labs  Lab 07/06/20 1623 07/11/20 1651  GLUCAP 95 81    Microbiology Studies:   No results found for this or any previous visit (from the past 240 hour(s)).   Radiology Studies:  No results found.   Scheduled Meds:   . (feeding supplement) PROSource Plus  30 mL Oral BID BM  . acetaminophen  1,000 mg Oral Q6H  . calcium-vitamin D  1 tablet Oral TID  . feeding supplement  1 Container Oral TID BM  . furosemide  40 mg Intravenous BID  . midodrine  5 mg Oral TID WC  . multivitamin with minerals  1 tablet Oral Daily  . pantoprazole  40 mg Oral Daily  . sodium bicarbonate  650 mg Oral BID  . sodium chloride flush  3 mL Intravenous Q12H    Continuous Infusions:     LOS: 31 days     Vernell Leep, MD, [place], [facility]. [facility]    To contact the attending provider between 7A-7P or the covering provider during after hours 7P-7A, please log into the web site [email or web address] and access using universal [place] password for that web site. If you do not have the password, please call the hospital operator.  07/12/2020, 4:07 PM

## 2020-07-13 DIAGNOSIS — I959 Hypotension, unspecified: Secondary | ICD-10-CM

## 2020-07-13 LAB — BASIC METABOLIC PANEL
Anion gap: 10 (ref 5–15)
BUN: 49 mg/dL — ABNORMAL HIGH (ref 8–23)
CO2: 21 mmol/L — ABNORMAL LOW (ref 22–32)
Calcium: 7.6 mg/dL — ABNORMAL LOW (ref 8.9–10.3)
Chloride: 116 mmol/L — ABNORMAL HIGH (ref 98–111)
Creatinine, Ser: 2.07 mg/dL — ABNORMAL HIGH (ref 0.44–1.00)
GFR calc Af Amer: 24 mL/min — ABNORMAL LOW (ref >60)
GFR calc non Af Amer: 20 mL/min — ABNORMAL LOW (ref >60)
Glucose, Bld: 84 mg/dL (ref 70–99)
Potassium: 3.8 mmol/L (ref 3.5–5.1)
Sodium: 147 mmol/L — ABNORMAL HIGH (ref 135–145)

## 2020-07-13 MED ORDER — TORSEMIDE 20 MG PO TABS
20.0000 mg | ORAL_TABLET | Freq: Every day | ORAL | Status: DC
  Administered 2020-07-13 – 2020-07-14 (×2): 20 mg via ORAL
  Filled 2020-07-13 (×2): qty 1

## 2020-07-13 NOTE — Progress Notes (Signed)
PROGRESS NOTE   [name]  DUK:[id number]    DOB: 1926-07-24    DOA: 06/11/2020  PCP: [name], MD   I have briefly reviewed patients previous medical records in [facility].  Chief complaint: Abdominal pain.  Brief Narrative:  86 year old female with PMH of tachybradycardia syndrome s/p biventricular pacemaker placement 2007, severe MR s/p repair, chronic atrial fibrillation not on anticoagulation due to GI bleed, abnormal esophageal motility consistent with presbyesophagus, gastritis, s/p balloon dilatation by GI, pulmonary hypertension, admitted with complaints of abdominal pain, black stools and increased confusion.  Admitted for sepsis due to UTI in the setting of obstructive uropathy, acute on chronic anemia.  S/p ureteral stent placement by urology on 7/26 due to left-sided moderate hydronephrosis.  CT abdomen showed cecal mass concerning for malignancy.  S/p colonoscopy 8/2 that showed large cecal mass, adenocarcinoma by pathology.  After cardiac clearance, she underwent surgery on 06/21/2020.  Hospital course complicated by probable ileus and multiple dark watery stools.  Diet advanced.  Wound VAC placed over base of incision, had some oozing at a subcutaneous hematoma 8/16.  Ongoing issues with volume overload, cardiology assisting, issues with hyponatremia, AKI.   Assessment & Plan:  Principal Problem:   Acute lower UTI Active Problems:   Liver cirrhosis (HCC)   CKD (chronic kidney disease) stage 3, GFR 30-59 ml/min   Anemia   Biventricular cardiac pacemaker in situ   Permanent atrial fibrillation (HCC)   Chronic diastolic CHF (congestive heart failure) (HCC)   Gout   S/P MVR (mitral valve repair)   AKI (acute kidney injury) ([place])   Orthostatic hypotension dysautonomic syndrome (HCC)   GI bleeding   Obstructive uropathy   Sepsis secondary to UTI ([place])   Hydronephrosis of left kidney   Left ureteral stone   Advanced care planning/counseling discussion   Goals  of care, counseling/discussion   Palliative care by specialist   DNR (do not resuscitate)   Acute on chronic renal insufficiency   Iron deficiency anemia due to chronic blood loss   Cancer of cecum (HCC)   Emesis   Gastritis with hemorrhage   Acute on chronic diastolic CHF (congestive heart failure) ([place])   Ileus (HCC)  Sepsis secondary to lactobacillus UTI complicating obstructive uropathy: Met sepsis criteria on admission.  Has completed course of multiple IV and oral antibiotics.  Also status post urology intervention for hydronephrosis.  Sepsis resolved.  Obstructive uropathy with left-sided moderate hydronephrosis secondary to left ureteral stone: Urology consulted and s/p stent placement 06/11/2020.  On 8/24, underwent cystoscopy with left ureteral stent removal, left ureteroscopy stone extraction and insertion of double-J stent.  Management per urology.  Possible aspiration pneumonia: Due to nausea and vomiting.  Completed antibiotic course.  Recommend outpatient follow-up of chest x-ray to ensure resolution of pneumonia findings.  Cecal adenocarcinoma: Soft tissue fullness noted in cecal area on CT abdomen and pelvis.  Repeat CT abdomen and pelvis showing a lobulated mass in the cecum concerning for malignancy.  GI consulted, s/p colonoscopy with biopsy on 06/18/2020 per Dr. Michail Sermon with pathology positive for adenocarcinoma.  CT chest with interval development of 10 mm subpleural rounded density seen posteriorly in the right lung apex.  Follow-up unenhanced chest CT in 3 months recommended to ensure stability or resolution.  Patient s/p laparoscopic right colectomy 06/22/2020 after preop cardiac clearance. Oncology consulted and are following and will evaluate postop to determine if further adjuvant treatment is feasible or warranted.  Palliative care also following.  Postop ileus resolved.  Currently tolerating a soft diet.  Will likely follow-up with oncology in the outpatient setting.   Appreciate general surgery, oncology, palliative care input and recommendations.  Fascial deficit s/p hematoma removal from base of abdominal wound 07/02/2020: Followed by [facility] RN and general surgery.  Wound VAC per general surgery.  Postop ileus versus SBO: Resolved and tolerating diet.  Acute on chronic combined systolic and diastolic CHF/anasarca: Echo 8/23: LVEF 40-45%.  Cardiology was consulted.  She had significant volume overload but ongoing soft blood pressures limiting aggressive diuresis.  Treated with IV Lasix for several days.  Midodrine was uptitrated to assist with soft BPs.  Volume overload has improved.  However patient's sodium has crept up to 147 and creatinine to 2.09.  As discussed with cardiology, IV Lasix stopped, started torsemide 20 mg daily, continue at discharge.  Close BMP monitoring when discharged to SNF.  Chronic hypotension: Asymptomatic.  Continue midodrine 7.5 mg 3 times daily.    Permanent atrial fibrillation: Beta-blockers discontinued due to hypotension concern and due to family concern regarding this.  Not anticoagulation candidate due to GI bleed history.  Heart rates paced and well controlled.  Tachybradycardia syndrome s/p BiV PPM/nonsustained V. tach: Last interrogation April 2021 noted to be V pacing.  Intermittent bouts of NSVT. Beta-blockers discontinued due to hypotension.  Potassium 3.8, replacing to keep >4.  Magnesium 2.2.  Severe TR Noted.  Acute kidney injury complicating stage IIIb CKD: Creatinine had peaked to 3 and improved but increased back up from 1.8-2.09 >2.27.  Follow BMP closely.  Hypernatremia: Intermittent.  Sodium up to 146 >147 today.  Will monitor closely while on IV diuresis..  Iron deficiency anemia secondary to colon cancer bleed/anemia of chronic disease: Status post recent EGD showing gastritis and presbyesophagus.  Baseline hemoglobin approximately 9.  On admission hemoglobin noted to be 7.9.  Status post 1 unit  transfusion packed red blood cells.  Status post IV Feraheme x2 (06/24/2020, 06/30/2020). Transfusion threshold hemoglobin < 7.  Hemoglobin stable in the low 7 g range.  Vitamin D deficiency: Continue Os-Cal and vitamin D3 supplements.  Outpatient follow-up.  Confusion versus sundowning: TSH slightly elevated.  Free T4 WNL.  Ammonia 35.  B12: 362.  Folate 8.6.  No focal deficits.  Seems to have resolved.   Body mass index is 38.43 kg/m./Obesity  Nutritional Status Nutrition Problem: Inadequate oral intake Etiology: altered GI function (newly diagnosed colon adenocarcimona pending right colectomy) Signs/Symptoms:  (NPO/CL > 5 days) Interventions: Boost Breeze, MVI, Prostat  DVT prophylaxis: SCDs Code Status: Full Family Communication: None at bedside.  Discussed with patient's daughter in detail via phone on 8/26, updated care and answered questions.  Encouraged her to discuss again with patient regarding advanced care planning, CODE STATUS etc. Disposition:  Status is: Inpatient  Remains inpatient appropriate because:IV treatments appropriate due to intensity of illness or inability to take PO   Dispo: The patient is from: ALF              Anticipated d/c is to: SNF              Anticipated d/c date is: 2 days              Patient currently is not medically stable to d/c.  Hopefully can DC to SNF for the weekend before renal functions and electrolytes stabilize.        Consultants:   General surgery Cardiology GI Urology Oncology Palliative care  Procedures:  Colonoscopy 06/18/2020--per Dr. Michail Sermon gastroenterology  Cystoscopy with left retrograde pyelogram and interpretation/cystoscopy with insertion of left double-J stent per Dr.Wrenn 06/11/2020  Transfusion 1 unit packed red blood cells 06/11/2020  Laparoscopic right colectomy by Dr. Marlou Starks III 06/22/2020  Evacuation of subcutaneous hematoma per general surgery 07/02/2020  Antimicrobials:   Completed course as per  Dr. Biagio Borg note from 8/24.   Subjective:  2 episodes of nausea and dry heaving without overt vomiting.  Denies dyspnea or pain.  Objective:   Vitals:   07/12/20 1305 07/12/20 1630 07/12/20 2122 07/13/20 0514  BP: (!) 157/139 (!) 112/46 103/60 (!) 114/48  Pulse: 70 69 73 70  Resp: (!) 24  (!) 24 18  Temp: (!) 97.5 F (36.4 C)  97.9 F (36.6 C) 98.1 F (36.7 C)  TempSrc: Oral     SpO2: 100%  100% 98%  Weight:      Height:        General exam: Pleasant elderly female, moderately built and nourished lying comfortably propped up in bed without distress. Respiratory system: Slightly diminished breath sounds in the bases but no wheezing, rhonchi or crackles appreciated.  No increased work of breathing. Cardiovascular system: S1 & S2 heard, RRR. No JVD, murmurs, rubs, gallops or clicks.  Lower extremity edema is improved, now trace.  Minimal upper extremity pitting edema.  Telemetry personally reviewed: V paced rhythm.  10 beat NSVT noted. Gastrointestinal system: Abdomen is nondistended, soft and nontender. No organomegaly or masses felt. Normal bowel sounds heard. Central nervous system: Alert and oriented x2. No focal neurological deficits.?  Somewhat hard of hearing. Extremities: Symmetric 5 x 5 power. Skin: No rashes, lesions or ulcers Psychiatry: Judgement and insight appear somewhat impaired. Mood & affect appropriate.     Data Reviewed:   I have personally reviewed following labs and imaging studies   CBC: Recent Labs  Lab 07/10/20 0405 07/11/20 0417 07/12/20 0840  WBC 7.0 6.9 5.5  NEUTROABS 4.8  --   --   HGB 7.3* 7.4* 8.2*  HCT 25.2* 25.5* 28.7*  MCV 92.0 92.7 92.9  PLT 147* 142* 924    Basic Metabolic Panel: Recent Labs  Lab 07/07/20 1451 07/07/20 1451 07/08/20 0533 07/09/20 0304 07/09/20 2041 07/09/20 2041 07/10/20 0405 07/10/20 0405 07/11/20 0417 07/12/20 0840 07/13/20 0534  NA 145   < > 145   < > 146*   < > 142   < > 145 146* 147*  K 4.0    < > 3.8   < > 4.0   < > 3.7   < > 4.1 3.9 3.8  CL 119*   < > 117*   < > 116*   < > 115*   < > 114* 115* 116*  CO2 20*   < > 20*   < > 20*   < > 20*   < > 20* 19* 21*  GLUCOSE 115*   < > 85   < > 84   < > 80   < > 80 104* 84  BUN 32*   < > 33*   < > 45*   < > 43*   < > 47* 53* 49*  CREATININE 1.70*   < > 1.61*   < > 2.02*   < > 1.81*   < > 1.86* 2.09* 2.07*  CALCIUM 7.1*   < > 7.3*   < > 7.6*   < > 7.1*   < > 7.2* 7.6* 7.6*  MG  --   --   --   --  2.1  --  2.2  --   --  2.2  --   PHOS 2.8  --  3.1  --   --   --   --   --   --   --   --    < > = values in this interval not displayed.    Liver Function Tests: Recent Labs  Lab 07/07/20 1451 07/08/20 0533  ALBUMIN 2.2* 2.4*    CBG: Recent Labs  Lab 07/06/20 1623 07/11/20 1651  GLUCAP 95 81    Microbiology Studies:   No results found for this or any previous visit (from the past 240 hour(s)).   Radiology Studies:  No results found.   Scheduled Meds:   . (feeding supplement) PROSource Plus  30 mL Oral BID BM  . acetaminophen  1,000 mg Oral Q6H  . calcium-vitamin D  1 tablet Oral TID  . feeding supplement  1 Container Oral TID BM  . midodrine  7.5 mg Oral TID WC  . multivitamin with minerals  1 tablet Oral Daily  . pantoprazole  40 mg Oral Daily  . sodium bicarbonate  650 mg Oral BID  . sodium chloride flush  3 mL Intravenous Q12H  . torsemide  20 mg Oral Daily    Continuous Infusions:     LOS: 32 days     Vernell Leep, MD, [place], [facility]. [facility]    To contact the attending provider between 7A-7P or the covering provider during after hours 7P-7A, please log into the web site [email or web address] and access using universal [place] password for that web site. If you do not have the password, please call the hospital operator.  07/13/2020, 2:59 PM

## 2020-07-13 NOTE — Progress Notes (Signed)
No changes from prior nurse's assessment. Will continue to assess patient.

## 2020-07-13 NOTE — Progress Notes (Addendum)
Progress Note  Patient Name: [name] Date of Encounter: 07/13/2020  [facility] Cardiologist: [name], MD   Subjective   No acute overnight events. Patient nauseous earlier this morning but this has improved. No cardiac complaints this morning. No chest pain. Breathing stable. No palpitations.  Inpatient Medications    Scheduled Meds: . (feeding supplement) PROSource Plus  30 mL Oral BID BM  . acetaminophen  1,000 mg Oral Q6H  . calcium-vitamin D  1 tablet Oral TID  . feeding supplement  1 Container Oral TID BM  . furosemide  40 mg Intravenous BID  . midodrine  7.5 mg Oral TID WC  . multivitamin with minerals  1 tablet Oral Daily  . pantoprazole  40 mg Oral Daily  . sodium bicarbonate  650 mg Oral BID  . sodium chloride flush  3 mL Intravenous Q12H   Continuous Infusions:  PRN Meds: HYDROmorphone (DILAUDID) injection, levalbuterol, metoprolol tartrate, ondansetron (ZOFRAN) IV, oxyCODONE, sodium chloride flush   Vital Signs    Vitals:   07/12/20 1305 07/12/20 1630 07/12/20 2122 07/13/20 0514  BP: (!) 157/139 (!) 112/46 103/60 (!) 114/48  Pulse: 70 69 73 70  Resp: (!) 24  (!) 24 18  Temp: (!) 97.5 F (36.4 C)  97.9 F (36.6 C) 98.1 F (36.7 C)  TempSrc: Oral     SpO2: 100%  100% 98%  Weight:      Height:        Intake/Output Summary (Last 24 hours) at 07/13/2020 0908 Last data filed at 07/13/2020 0900 Gross per 24 hour  Intake 843 ml  Output 550 ml  Net 293 ml   Last 3 Weights 07/12/2020 07/11/2020 07/10/2020  Weight (lbs) 210 lb 1.6 oz 210 lb 12.2 oz 206 lb 9.1 oz  Weight (kg) 95.301 kg 95.6 kg 93.7 kg      Telemetry    V paced with rates in the 70's. Frequent PVC and short runs of NSVT (longest being 14 beats today). - Personally Reviewed  ECG    No new ECG tracing today. - Personally Reviewed  Physical Exam   GEN: No acute distress.   Neck: Difficult to assess JVD. Cardiac: RRR. No murmurs, rubs, or gallops.  Respiratory: No  increased work of breathing. Rhonchi and mild wheezing bilaterally. No significant crackles. GI: Soft, non-tender, non-distended. Bowel sounds present. MS: 1+ pitting edema bilaterally. No deformity. Skin: Warm and dry. Neuro:  No focal deficits. Psych: Normal affect. Responds appropriately.  Labs    High Sensitivity Troponin:  No results for input(s): TROPONINIHS in the last 720 hours.    Chemistry Recent Labs  Lab 07/07/20 1451 07/07/20 1451 07/08/20 0533 07/09/20 0304 07/11/20 0417 07/12/20 0840 07/13/20 0534  NA 145   < > 145   < > 145 146* 147*  K 4.0   < > 3.8   < > 4.1 3.9 3.8  CL 119*   < > 117*   < > 114* 115* 116*  CO2 20*   < > 20*   < > 20* 19* 21*  GLUCOSE 115*   < > 85   < > 80 104* 84  BUN 32*   < > 33*   < > 47* 53* 49*  CREATININE 1.70*   < > 1.61*   < > 1.86* 2.09* 2.07*  CALCIUM 7.1*   < > 7.3*   < > 7.2* 7.6* 7.6*  ALBUMIN 2.2*  --  2.4*  --   --   --   --  GFRNONAA 26*   < > 28*   < > 23* 20* 20*  GFRAA 30*   < > 32*   < > 27* 23* 24*  ANIONGAP 6   < > 8   < > 11 12 10    < > = values in this interval not displayed.     Hematology Recent Labs  Lab 07/10/20 0405 07/11/20 0417 07/12/20 0840  WBC 7.0 6.9 5.5  RBC 2.74* 2.75* 3.09*  HGB 7.3* 7.4* 8.2*  HCT 25.2* 25.5* 28.7*  MCV 92.0 92.7 92.9  MCH 26.6 26.9 26.5  MCHC 29.0* 29.0* 28.6*  RDW 24.0* 23.9* 23.9*  PLT 147* 142* 150    BNP Recent Labs  Lab 07/09/20 2041  BNP 476.5*     DDimer No results for input(s): DDIMER in the last 168 hours.   Radiology    No results found.  Cardiac Studies   Echocardiogram 07/09/2020: Impressions: 1. Left ventricular ejection fraction, by estimation, is 40 to 45%. The  left ventricle has mildly decreased function. The left ventricle  demonstrates global hypokinesis. Left ventricular diastolic function could  not be evaluated.  2. Right ventricular systolic function is mildly reduced. The right  ventricular size is mildly enlarged. There is  moderately elevated  pulmonary artery systolic pressure. The estimated right ventricular  systolic pressure is 37.1 mmHg.  3. Left atrial size was severely dilated.  4. Right atrial size was severely dilated.  5. The mitral valve has been repaired/replaced. Trivial mitral valve  regurgitation. The mean mitral valve gradient is 4.0 mmHg with average  heart rate of 70 bpm. There is a 28 mm prosthetic annuloplasty ring  present in the mitral position.  6. The pericardial effusion is posterior to the left ventricle. Moderate  pleural effusion in the left lateral region.  7. The tricuspid valve is abnormal. Tricuspid valve regurgitation is  moderate to severe.  8. The aortic valve is tricuspid. Mild aortic valve sclerosis is present,  with no evidence of aortic valve stenosis.  9. The inferior vena cava is dilated in size with <50% respiratory  variability, suggesting right atrial pressure of 15 mmHg.   Comparison(s): Changes from prior study are noted. 04/05/20: LVEF 40%,  severe TR.   Patient Profile     86 y.o. female with a history of chronic combined systolic/diastolic heart failure,tachybradysyndrome status post BiV PPM, severe MR status post MV repair in 2003, CKD stage III, chronic atrial fibrillation not on anticoagulation due to GI bleed,orthostatichypotension, moderate pulmonary hypertension,severe TR who is being seen today for the evaluation ofheart failureat the request of Dr. Grandville Silos.  Assessment & Plan    Acute on Chronic Combined CHF - BNP elevated at 477. - Echo showed LVEF of 40-45%, mild RV dysfunction, moderate to severe TR, RVSP 45 mmHg.  - Diuresis being limited by hypotension. Net positive 4.1 L since admission. Only 700 cc of urine output documented over the last 24 hours. Weight 210 lbs today, up from 197 on admission. Renal function stable but hypernatremia slightly worse (Na 142 >> 145 >> 146 >> 147.)  - Discussed with MD - will transition back to  Torsemide 20mg  daily. - Continue to monitor daily weights, strict I/O's, and renal function.  Permanent Atrial Fibrillation - Heart rates well controlled. - Was on Toprol-XL 12.5mg  daily but family requested this be stopped due to hypotension.  - Continue to monitor on telemetry.  Non-Sustained VT - Patient having frequent PVC and short runs of NSVT (longest  run being 14 beats on 8/27). - Potassium 3.8. Goal >4.0. Will give dose of K-Dur 40 mEq today. - Magnesium 2.2 yesterday. Goal >2.0.  Supplement as needed. - Was on Toprol-XL 12.5mg  daily but family requested this be stopped due to hypotension.  - Continue to monitor on telemetry.  Severe TR - Noted on Echo this admission. - Continue to treat conservatively at this time.  Tachybrady Syndrome  - s/p BiV PPM.  Hypotension - Chronic issues.  - Midodrine was increased to 7.5mg  TID yesterday.  Acute on CKD Stage III - Creatinine 3.16 on admission. Gradually improved but started to rise again. 2.09 today (up from 1.86 yesterday).  - Continue to monitor closely.  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  Agree with above documentation.  On exam, patient is alert and oriented, regular rate and rhythm, 2/6 systolic murmur, respiratory exam has improved- wheezing has resolved but still with basilar crackles, no LE edema or JVD.  Telemetry personally reviewed, shows V paced rhythm in 70s, 14 beat run of NSVT.  For NSVT, replete for K >4, Mag > 2.   Mild worsening in hyponatremia (145 > 146 > 147).  Respiratory status appears improved.  Would recommend switching from IV Lasix to p.o. torsemide 20 mg.  Suspect she still has significant volume left to remove (weight 210 lbs, up from 194 lbs on admission), but will need to pull volume slowly given her soft BPs and worsening hypernatremia.  Donato Heinz, MD

## 2020-07-14 LAB — SARS CORONAVIRUS 2 BY RT PCR (HOSPITAL ORDER, PERFORMED IN ~~LOC~~ HOSPITAL LAB): SARS Coronavirus 2: NEGATIVE

## 2020-07-14 LAB — BASIC METABOLIC PANEL
Anion gap: 10 (ref 5–15)
BUN: 57 mg/dL — ABNORMAL HIGH (ref 8–23)
CO2: 20 mmol/L — ABNORMAL LOW (ref 22–32)
Calcium: 7.4 mg/dL — ABNORMAL LOW (ref 8.9–10.3)
Chloride: 114 mmol/L — ABNORMAL HIGH (ref 98–111)
Creatinine, Ser: 1.92 mg/dL — ABNORMAL HIGH (ref 0.44–1.00)
GFR calc Af Amer: 26 mL/min — ABNORMAL LOW (ref >60)
GFR calc non Af Amer: 22 mL/min — ABNORMAL LOW (ref >60)
Glucose, Bld: 79 mg/dL (ref 70–99)
Potassium: 3.4 mmol/L — ABNORMAL LOW (ref 3.5–5.1)
Sodium: 144 mmol/L (ref 135–145)

## 2020-07-14 MED ORDER — FEBUXOSTAT 80 MG PO TABS: 40.0000 mg | ORAL_TABLET | Freq: Every day | ORAL | Status: AC

## 2020-07-14 MED ORDER — PROSOURCE PLUS PO LIQD: 30.0000 mL | Freq: Two times a day (BID) | ORAL | Status: AC

## 2020-07-14 MED ORDER — ACETAMINOPHEN 500 MG PO TABS: 1000.0000 mg | ORAL_TABLET | Freq: Four times a day (QID) | ORAL | Status: AC | PRN

## 2020-07-14 MED ORDER — MIDODRINE HCL 5 MG PO TABS: 7.5000 mg | ORAL_TABLET | Freq: Three times a day (TID) | ORAL | Status: AC

## 2020-07-14 MED ORDER — POTASSIUM CHLORIDE CRYS ER 20 MEQ PO TBCR
40.0000 meq | EXTENDED_RELEASE_TABLET | Freq: Once | ORAL | Status: AC
  Administered 2020-07-14: 40 meq via ORAL
  Filled 2020-07-14: qty 2

## 2020-07-14 MED ORDER — SODIUM BICARBONATE 650 MG PO TABS: 650.0000 mg | ORAL_TABLET | Freq: Two times a day (BID) | ORAL | Status: AC

## 2020-07-14 NOTE — Progress Notes (Signed)
Called report to Corporate treasurer at [facility].  All questions answered.  VSS.  Packet ready for PTAR pick up.

## 2020-07-14 NOTE — NC FL2 (Signed)
[place] LEVEL OF CARE SCREENING TOOL     IDENTIFICATION  Patient Name: [name] Birthdate: Aug 08, 1926 Sex: female Admission Date (Current Location): 06/11/2020  [facility] and [place] Number:  Herbalist and Address:  [facility],  [place] [address], [place]      Provider Number: [id number]  Attending Physician Name and Address:  Modena Jansky, MD  Relative Name and Phone Number:  Tobie Poet dtr [phone]    Current Level of Care: Hospital Recommended Level of Care: [place] Prior Approval Number:    Date Approved/Denied:   PASRR Number: [id number]  Discharge Plan: SNF    Current Diagnoses: Patient Active Problem List   Diagnosis Date Noted  . Acute on chronic diastolic CHF (congestive heart failure) ([place])   . Ileus ([place])   . Cancer of cecum ([place])   . Emesis   . Gastritis with hemorrhage   . Iron deficiency anemia due to chronic blood loss 06/21/2020  . Advanced care planning/counseling discussion   . Goals of care, counseling/discussion   . Palliative care by specialist   . DNR (do not resuscitate)   . Acute on chronic renal insufficiency   . Sepsis secondary to UTI ([place]) 06/12/2020  . Hydronephrosis of left kidney 06/12/2020  . Left ureteral stone 06/12/2020  . GI bleeding 06/11/2020  . Obstructive uropathy 06/11/2020  . Syncope and collapse 04/04/2020  . Influenza A   . Bronchiectasis with acute exacerbation ([place])   . Respiratory distress 10/06/2017  . Orthostatic hypotension dysautonomic syndrome ([place])   . AKI (acute kidney injury) ([place]) 05/21/2016  . Cellulitis of leg, right 08/18/2015  . NSVT (nonsustained ventricular tachycardia) ([place])   . Gout flare 08/16/2015  . Abnormal thyroid function test 08/15/2015  . Right ankle pain 08/15/2015  . History of gout 08/15/2015  . Ankle pain   . S/P MVR (mitral valve repair) 01/05/2014  . Acute posthemorrhagic anemia 12/29/2013  .  Encounter for therapeutic drug monitoring 12/12/2013  . Acute lower UTI 11/27/2013  . Gout 11/07/2013  . Orthostatic hypotension 10/31/2013  . Chronic diastolic CHF (congestive heart failure) ([place])   . Hypertension   . Mitral valve disorder   . Permanent atrial fibrillation ([place]) 10/28/2013  . Biventricular cardiac pacemaker in situ 10/14/2013  . Perirectal abscess 10/13/2013  . Chronic anticoagulation 10/10/2013  . Liver cirrhosis ([place]) 10/10/2013  . Thrombocytopenia ([place]) 10/10/2013  . Nausea and vomiting 10/10/2013  . CKD (chronic kidney disease) stage 3, GFR 30-59 ml/min 10/10/2013  . Anemia 10/10/2013    Orientation RESPIRATION BLADDER Height & Weight     Self, Time, Situation, Place  Normal Continent Weight: 211 lb 6.7 oz (95.9 kg) Height:  5\' 2"  (157.5 cm)  BEHAVIORAL SYMPTOMS/MOOD NEUROLOGICAL BOWEL NUTRITION STATUS      Continent Diet (soft (see dc summary))  AMBULATORY STATUS COMMUNICATION OF NEEDS Skin   Extensive Assist Verbally Normal                       Personal Care Assistance Level of Assistance  Bathing, Feeding, Dressing Bathing Assistance: Limited assistance Feeding assistance: Limited assistance Dressing Assistance: Limited assistance     Functional Limitations Info  Sight, Hearing, Speech Sight Info: Impaired Hearing Info: Adequate Speech Info: Impaired    SPECIAL CARE FACTORS FREQUENCY  PT (By licensed PT), OT (By licensed OT)     PT Frequency: 5x week OT Frequency: 5x week  Contractures Contractures Info: Not present    Additional Factors Info  Code Status, Allergies Code Status Info: Full Allergies Info: NKA           Current Medications (07/14/2020):  This is the current hospital active medication list Current Facility-Administered Medications  Medication Dose Route Frequency Provider Last Rate Last Admin  . (feeding supplement) PROSource Plus liquid 30 mL  30 mL Oral BID BM Irine Seal, MD   30 mL at 07/13/20  1513  . acetaminophen (TYLENOL) tablet 1,000 mg  1,000 mg Oral Q6H Irine Seal, MD   1,000 mg at 07/14/20 0526  . calcium-vitamin D (OSCAL WITH D) 500-200 MG-UNIT per tablet 1 tablet  1 tablet Oral TID Irine Seal, MD   1 tablet at 07/14/20 1018  . feeding supplement (BOOST / RESOURCE BREEZE) liquid 1 Container  1 Container Oral TID BM Irine Seal, MD   1 Container at 07/13/20 1957  . HYDROmorphone (DILAUDID) injection 0.5-1 mg  0.5-1 mg Intravenous Q1H PRN Irine Seal, MD   1 mg at 06/25/20 0851  . levalbuterol (XOPENEX) nebulizer solution 0.63 mg  0.63 mg Nebulization Q6H PRN Irine Seal, MD      . metoprolol tartrate (LOPRESSOR) injection 5 mg  5 mg Intravenous Q6H PRN Irine Seal, MD      . midodrine (PROAMATINE) tablet 7.5 mg  7.5 mg Oral TID WC Hongalgi, Anand D, MD   7.5 mg at 07/14/20 1016  . multivitamin with minerals tablet 1 tablet  1 tablet Oral Daily Irine Seal, MD   1 tablet at 07/14/20 1018  . ondansetron (ZOFRAN) injection 4 mg  4 mg Intravenous Q4H PRN Irine Seal, MD   4 mg at 07/13/20 0901  . oxyCODONE (Oxy IR/ROXICODONE) immediate release tablet 2.5 mg  2.5 mg Oral Q4H PRN Irine Seal, MD   2.5 mg at 06/27/20 2224  . pantoprazole (PROTONIX) EC tablet 40 mg  40 mg Oral Daily Irine Seal, MD   40 mg at 07/14/20 1019  . potassium chloride SA (KLOR-CON) CR tablet 40 mEq  40 mEq Oral Once Vernell Leep D, MD      . sodium bicarbonate tablet 650 mg  650 mg Oral BID Irine Seal, MD   650 mg at 07/14/20 1019  . sodium chloride flush (NS) 0.9 % injection 10-40 mL  10-40 mL Intracatheter PRN Irine Seal, MD   10 mL at 07/11/20 0419  . sodium chloride flush (NS) 0.9 % injection 3 mL  3 mL Intravenous Q12H Irine Seal, MD   3 mL at 07/13/20 2203  . torsemide (DEMADEX) tablet 20 mg  20 mg Oral Daily Sande Rives E, PA-C   20 mg at 07/14/20 1019     Discharge Medications: Please see discharge summary for a list of discharge medications.  Relevant Imaging Results:  Relevant Lab  Results:   Additional Information SS#[id number] [place], LCSW

## 2020-07-14 NOTE — TOC Progression Note (Signed)
Transition of Care [facility]) - Progression Note    Patient Details  Name: ENEDELIA MARTORELLI MRN: [id number] Date of Birth: [birth year]  [facility] CM/SW Contact  Shade Flood, LCSW Phone Number: 07/14/2020, 10:02 AM  Clinical Narrative:     Received consult stating that pt may be medically stable for dc to SNF this weekend. Reviewed pt's record. Urology note from this AM indicates that plan is to remove stent at bedside tomorrow AM.   Spoke with Irine Seal at [facility] to update. She states that they can take pt this weekend if she is ready for dc. Pt will need updated COVID test. [facility] has wound vac if this is still being used for treatment at dc.  Updated MD. Donella Stade will follow.  Expected Discharge Plan: [place] Barriers to Discharge: Continued Medical Work up  Expected Discharge Plan and Services Expected Discharge Plan: [facility]   Discharge Planning Services: CM Consult Post Acute Care Choice: [place] Living arrangements for the past 2 months: [place]                                       Social Determinants of Health (SDOH) Interventions    Readmission Risk Interventions No flowsheet data found.

## 2020-07-14 NOTE — Discharge Summary (Signed)
Physician Discharge Summary  [name] FTD:[id number] DOB: [birth year]  PCP: [name], MD  Admitted from: [facility]. Discharged to: SNF  Admit date: 06/11/2020 Discharge date: 07/14/2020  Recommendations for Outpatient Follow-up:    Contact information for follow-up providers    Schedule an appointment as soon as possible for a visit with Irine Seal, MD.   Specialty: Urology Why: My office will arrange your next procedure for 2-3 weeks from now.  Contact information: [place] [address] [phone]        [name], MD. Schedule an appointment as soon as possible for a visit.   Specialty: Internal Medicine Why: Upon discharge from SNF. Contact information: [address] [place] [address] [phone]        Sueanne Margarita, MD. Schedule an appointment as soon as possible for a visit.   Specialty: Cardiology Contact information: [address] [place] [address] [phone]        Jovita Kussmaul, MD. Go on 07/22/2020.   Specialty: General Surgery Why: 115pm. Please arrive to your appointment 30 minutes early for paperwork. Please bring a copy of your photo ID and insurance card to the appointment.  Contact information: [place] [address] [phone]        MD at SNF. Schedule an appointment as soon as possible for a visit in 3 day(s).   Why: To be seen with repeat labs (CBC, CMP and magnesium).  Recommend palliative care consultation at SNF for ongoing goals of care discussion.           Contact information for after-discharge care    Destination    [facility] .   Service: Skilled Nursing Contact information: [place] [phone]                 Patient already has an outpatient follow-up appointment with Dr. Narda Rutherford, Medical Oncology on 07/25/2020 at 11 AM. Patient  already has an outpatient follow-up appointment with Dr. Golden Hurter, Cardiology on 07/19/2020 at 11:40 AM.  Home Health: N/A Equipment/Devices: TBD at SNF.  She will continue abdominal wound VAC at the SNF.  Discharge Condition: Improved and stable CODE STATUS: Full. Diet recommendation: Heart healthy diet.  Discharge Diagnoses:  Principal Problem:   Acute lower UTI Active Problems:   Liver cirrhosis (HCC)   CKD (chronic kidney disease) stage 3, GFR 30-59 ml/min   Anemia   Biventricular cardiac pacemaker in situ   Permanent atrial fibrillation (HCC)   Chronic diastolic CHF (congestive heart failure) (HCC)   Gout   S/P MVR (mitral valve repair)   AKI (acute kidney injury) ([place])   Orthostatic hypotension dysautonomic syndrome (HCC)   GI bleeding   Obstructive uropathy   Sepsis secondary to UTI ([place])   Hydronephrosis of left kidney   Left ureteral stone   Advanced care planning/counseling discussion   Goals of care, counseling/discussion   Palliative care by specialist   DNR (do not resuscitate)   Acute on chronic renal insufficiency   Iron deficiency anemia due to chronic blood loss   Cancer of cecum (HCC)   Emesis   Gastritis with hemorrhage   Acute on chronic diastolic CHF (congestive heart failure) (HCC)   Ileus (HCC)   Brief Summary: 86 year old female with PMH of tachybradycardia syndrome s/p biventricular pacemaker placement 2007, severe MR s/p repair, chronic atrial fibrillation not on  anticoagulation due to GI bleed, abnormal esophageal motility consistent with presbyesophagus, gastritis, s/p balloon dilatation by GI, pulmonary hypertension, admitted with complaints of abdominal pain, black stools and increased confusion.  Admitted for sepsis due to UTI in the setting of obstructive uropathy, acute on chronic anemia.  S/p ureteral stent placement by urology on 7/26 due to left-sided moderate hydronephrosis.  CT abdomen showed cecal mass concerning for malignancy.  S/p  colonoscopy 8/2 that showed large cecal mass, adenocarcinoma by pathology.  After cardiac clearance, she underwent surgery on 06/21/2020.  Hospital course complicated by probable ileus, multiple dark watery stools, volume overload, acute on chronic kidney disease and hypernatremia.    Assessment & Plan:   Sepsis secondary to lactobacillus UTI complicating obstructive uropathy: Met sepsis criteria on admission.  Has completed course of antibiotics.  Also status post urology intervention for hydronephrosis.  Sepsis resolved.  Obstructive uropathy with left-sided moderate hydronephrosis secondary to left ureteral stone: Urology consulted and s/p stent placement 06/11/2020.  On 8/24, underwent cystoscopy with left ureteral stent removal, left ureteroscopy stone extraction and insertion of double-J stent.  Management per urology.  As per urology follow-up, her stent was supposed to be removed at bedside tomorrow but RN has communicated with Dr. Jeffie Pollock who has advised her to remove the stent at bedside today prior to discharge to SNF.  Outpatient follow-up with urology.  Possible aspiration pneumonia: Due to nausea and vomiting.  Completed antibiotic course.  Recommend outpatient follow-up of chest x-ray to ensure resolution of pneumonia findings.  Cecal adenocarcinoma: Soft tissue fullness noted in cecal area on CT abdomen and pelvis. Repeat CT abdomen and pelvis showing a lobulated mass in the cecum concerning for malignancy. GI consulted, s/p colonoscopy with biopsy on 06/18/2020 per Dr. Michail Sermon with pathology positive for adenocarcinoma. CT chest with interval development of 10 mm subpleural rounded density seen posteriorly in the right lung apex. Follow-up unenhanced chest CT in 3 months recommended to ensure stability or resolution. CEA 575.  Patient s/p laparoscopic right hemicolectomy 06/21/2020 after preop cardiac clearance.  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  CT chest as above also concerning for possible metastatic lesion in the right lung apex.  Medical oncology consulted on patient in the hospital and have arranged outpatient follow-up as noted above.  Postop ileus has resolved.  Patient is tolerating diet and having BMs.  No pain and has not used much pain medications other than Tylenol in days.  Open abdominal wound for which CT scan did not show any signs of dehiscence on 8/16.  Wound VAC was placed on this wound on 8/17.  General surgery recommended continuing wound VAC and signed off several days ago.  She has an outpatient follow-up with general surgery.  Palliative care team did see her for goals of care.  Patient and family wish to pursue full scope and full CODE STATUS.  Also DC to SNF.  Recommend palliative care follow-up at SNF.  I have discussed with patient's daughter a couple times regarding careful reconsideration of current full CODE STATUS given patient's very advanced age, frail health, multiple severe significant comorbidities and high risk for recurrent decline and even death.  She verbalized understanding and plans to discuss with patient.  Fascial deficit s/p hematoma removal from base of abdominal wound 07/02/2020: Followed by [facility] RN and general surgery.  Wound VAC per general surgery.  Postop ileus versus SBO: Resolved and tolerating diet.  Acute on chronic combined systolic and diastolic CHF/anasarca: Echo 8/23: LVEF  40-45%.  Cardiology was consulted.  She had significant volume overload but ongoing soft blood pressures limiting aggressive diuresis.  Treated with IV Lasix for several days.  Midodrine was uptitrated to assist with soft BPs.  Volume overload has improved.  However patient's sodium has crept up to 147 and creatinine to 2.09.    Patient was switched back to torsemide.  Hypernatremia has resolved.  Creatinine also improved today.  Cardiology follow-up today appreciated.  I discussed with cardiology.   She is cleared from their standpoint for discharge and they will arrange outpatient follow-up.  Chronic hypotension: Asymptomatic.  Continue midodrine 7.5 mg 3 times daily.    Blood pressures have improved.  Permanent atrial fibrillation: Beta-blockers discontinued due to hypotension concern and due to family concern regarding this.  Not anticoagulation candidate due to GI bleed history.  Heart rates paced and well controlled.  Has pacemaker for backup.  Tachybradycardia syndrome s/p BiV PPM/nonsustained V. tach: Last interrogation April 2021 noted to be V pacing.  Intermittent bouts of NSVT. Beta-blockers discontinued due to hypotension.  Potassium 3.4, replaced prior to discharge, keep >4.  Magnesium 2.2.  Severe TR Noted.  Acute kidney injury complicating stage IIIb CKD: Creatinine had peaked to 3 and improved but increased back up from 1.8-2.09 >2.27.  Worsening was likely related to fluid shifts and IV diuresis.  IV Lasix discontinued.  Serum creatinine has now improved to 1.92.  Recommend close outpatient follow-up at SNF.  Hypernatremia: Intermittent.    Now resolved and sodium 144.  Iron deficiency anemia secondary to colon cancer bleed/anemia of chronic disease: Status post recent EGD showing gastritis and presbyesophagus. Baseline hemoglobin approximately 9. On admission hemoglobin noted to be 7.9. Status post 1 unit transfusion packed red blood cells. Status post IV Feraheme x2 (06/24/2020, 06/30/2020).Transfusion threshold hemoglobin < 7.  Resume prior home iron supplements at discharge.  Most recent hemoglobin is up to 8.2.  Vitamin D deficiency: Continue Os-Cal and vitamin D3 supplements.  Outpatient follow-up.  Confusion versus sundowning: TSH slightly elevated.  Free T4 WNL.  Ammonia 35.  B12: 362.  Folate 8.6.  No focal deficits.    Resolved.  Body mass index is 38.43 kg/m./Obesity  Nutritional Status Nutrition Problem: Inadequate oral intake Etiology:  altered GI function (newly diagnosed colon adenocarcimona pending right colectomy) Signs/Symptoms:  (NPO/CL > 5 days) Interventions: Boost Breeze, MVI, Prostat  Adult failure to thrive: Multifactorial due to very advanced age, frail physical health and multiple severe significant comorbidities as discussed above.  Recommend palliative care follow-up at SNF.     Consultations: General surgery Cardiology GI Urology Oncology Palliative care  Procedures:  Colonoscopy 06/18/2020--per Dr. Michail Sermon gastroenterology  Cystoscopy with left retrograde pyelogram and interpretation/cystoscopy with insertion of left double-J stent per Dr.Wrenn 06/11/2020  Transfusion 1 unit packed red blood cells 06/11/2020  Laparoscopic right colectomy by Dr. Marlou Starks III 06/22/2020  Evacuation of subcutaneous hematoma per general surgery 07/02/2020  Ureteral stent with tether to be removed at bedside by patient's nurse as per urology recommendations, prior to discharge.   Discharge Instructions  Discharge Instructions    (HEART FAILURE PATIENTS) Call MD:  Anytime you have any of the following symptoms: 1) 3 pound weight gain in 24 hours or 5 pounds in 1 week 2) shortness of breath, with or without a dry hacking cough 3) swelling in the hands, feet or stomach 4) if you have to sleep on extra pillows at night in order to breathe.   Complete by: As directed  Call MD for:  difficulty breathing, headache or visual disturbances   Complete by: As directed    Call MD for:  extreme fatigue   Complete by: As directed    Call MD for:  persistant dizziness or light-headedness   Complete by: As directed    Call MD for:  persistant nausea and vomiting   Complete by: As directed    Call MD for:  redness, tenderness, or signs of infection (pain, swelling, redness, odor or green/yellow discharge around incision site)   Complete by: As directed    Call MD for:  severe uncontrolled pain   Complete by: As directed    Call MD  for:  temperature >100.4   Complete by: As directed    Diet - low sodium heart healthy   Complete by: As directed    Discharge instructions   Complete by: As directed    1) Continue abdominal wound VAC management at SNF and follow-up with outpatient general surgery for further recommendations. 2) feeding supplement (BOOST / RESOURCE BREEZE) liquid 1 Container, Oral, 3 times daily between meals   Discharge wound care:   Complete by: As directed    Continue negative pressure wound therapy to abdominal wound.  The wound VAC is to be changed Tuesdays, Thursdays and Saturdays.  Amount of suction: 125 mmHg.  Suction type: Continuous.   Increase activity slowly   Complete by: As directed        Medication List    TAKE these medications   (feeding supplement) PROSource Plus liquid Take 30 mLs by mouth 2 (two) times daily between meals.   acetaminophen 500 MG tablet Commonly known as: TYLENOL Take 2 tablets (1,000 mg total) by mouth every 6 (six) hours as needed for mild pain or moderate pain. What changed: reasons to take this   cetirizine 10 MG tablet Commonly known as: ZYRTEC Take 10 mg by mouth daily.   Febuxostat 80 MG Tabs Take 0.5 tablets (40 mg total) by mouth daily. What changed: how much to take   ferrous sulfate 325 (65 FE) MG tablet Take 1 tablet (325 mg total) by mouth 2 (two) times daily with a meal.   midodrine 5 MG tablet Commonly known as: PROAMATINE Take 1.5 tablets (7.5 mg total) by mouth 3 (three) times daily with meals. What changed: how much to take   Ocuvite Adult 50+ Caps Take 1 capsule by mouth daily.   pantoprazole 40 MG tablet Commonly known as: PROTONIX Take 40 mg by mouth every morning.   potassium chloride SA 20 MEQ tablet Commonly known as: KLOR-CON Take 1 tablet (20 mEq total) by mouth daily.   sodium bicarbonate 650 MG tablet Take 1 tablet (650 mg total) by mouth 2 (two) times daily.   torsemide 20 MG tablet Commonly known as:  DEMADEX Take 1 tablet (20 mg total) by mouth daily.      No Known Allergies    Procedures/Studies: CT ABDOMEN PELVIS WO CONTRAST  Result Date: 07/02/2020 CLINICAL DATA:  Abdominal pain. EXAM: CT ABDOMEN AND PELVIS WITHOUT CONTRAST TECHNIQUE: Multidetector CT imaging of the abdomen and pelvis was performed following the standard protocol without IV contrast. COMPARISON:  June 15, 2020 FINDINGS: Lower chest: Mild atelectasis and/or infiltrate is seen within the bilateral lung bases and posterior aspect of the left upper lobe. Multiple sternal wires are noted. Hepatobiliary: No focal liver abnormality is seen. Status post cholecystectomy. No biliary dilatation. Pancreas: Unremarkable. No pancreatic ductal dilatation or surrounding inflammatory changes. Spleen: A  3.6 cm diameter cyst is seen within the posteromedial aspect of the spleen. Adrenals/Urinary Tract: Adrenal glands are unremarkable. Kidneys are normal in size. 7 mm and 4 mm round hyperdense foci are seen within the anterior aspect of the mid right kidney. A 2 mm nonobstructing renal stone is noted within the upper pole of the right kidney. A 4 mm nonobstructing renal stone is seen within the anterior aspect of the lower pole of the right kidney. A left-sided endo ureteral stent is noted. A 2 mm nonobstructing renal stone is seen within the mid left kidney. Bladder is unremarkable. Stomach/Bowel: Stomach is within normal limits. The appendix is not clearly identified. Surgical sutures are seen along the expected region of the distal ascending colon. No evidence of bowel dilatation. Noninflamed diverticula are seen within the large bowel. Vascular/Lymphatic: There is marked severity calcification of the abdominal aorta and bilateral common iliac arteries, without evidence of aneurysmal dilatation. 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 No enlarged abdominal or pelvic lymph nodes. Reproductive: Uterus and bilateral adnexa are  unremarkable. Other: A surgical defect is seen along the midline of the anterior abdominal wall. No abdominopelvic ascites. Musculoskeletal: Moderate severity degenerative changes seen throughout the lumbar spine with approximately 3 mm retrolisthesis of the L4 vertebral body on L5 and 2 mm retrolisthesis of the L5 vertebral body on S1. IMPRESSION: 1. Mild atelectasis and/or infiltrate within the bilateral lung bases and posterior aspect of the left upper lobe. 2. Evidence of prior cholecystectomy. 3. Bilateral nonobstructing renal calculi. 4. Colonic diverticulosis. Aortic Atherosclerosis (ICD10-I70.0). Electronically Signed   By: Virgina Norfolk M.D.   On: 07/02/2020 15:08   CT ABDOMEN PELVIS WO CONTRAST  Result Date: 06/15/2020 CLINICAL DATA:  86 year old female with concern for cecal stool versus mass. EXAM: CT ABDOMEN AND PELVIS WITHOUT CONTRAST TECHNIQUE: Multidetector CT imaging of the abdomen and pelvis was performed following the standard protocol without IV contrast. COMPARISON:  CT abdomen pelvis dated 06/11/2020. FINDINGS: Evaluation of this exam is limited in the absence of intravenous contrast. Lower chest: Small bilateral pleural effusions with associated bibasilar subsegmental atelectasis. Pneumonia is not excluded. Clinical correlation is recommended. There is mild cardiomegaly. Coronary vascular calcification, cardiac pacemaker wires, and mechanical mitral valve. There is hypoattenuation of the cardiac blood pool suggestive of anemia. Clinical correlation is recommended. No intra-abdominal free air or free fluid. Hepatobiliary: Morphologic changes of cirrhosis. No intrahepatic biliary ductal dilatation. Cholecystectomy. No retained calcified stone noted in the central CBD. Pancreas: The pancreas is unremarkable as visualized. Spleen: There is a 2.5 cm indeterminate hypodense lesion in the spleen which demonstrates fluid attenuation, likely a cyst or hemangioma. Small calcified splenic  granuloma. Adrenals/Urinary Tract: The adrenal glands unremarkable. There is a left ureteral stent with proximal tip in the upper pole collecting system and distal end within the urinary bladder. There is mild left hydronephrosis. There is a 5 mm stone in the proximal left ureter along the course of the stent. A curvilinear calculus in the region of the left renal pelvis likely combination of the previously seen stone and the stone noted previously in the inferior pole of the left kidney. No stone identified within the left kidney. There is a 4 mm linear nonobstructing right renal inferior pole calculus as well as several additional punctate nonobstructing calculi. There is no hydronephrosis on the right. Subcentimeter bilateral renal lesions are not characterized on this CT. There is mild bilateral renal parenchyma atrophy. The right ureter is unremarkable. The urinary bladder  is grossly unremarkable. Air within the bladder likely related to recent instrumentation. Stomach/Bowel: There is loose stool noted throughout the colon. There is a 5.6 x 3.5 cm lobulated mass in the cecum. Further evaluation with colonoscopy recommended. There is sigmoid diverticulosis without active inflammatory changes. There is no bowel obstruction. Appendectomy. Vascular/Lymphatic: Advanced aortoiliac atherosclerotic disease. The IVC is unremarkable. No portal venous gas. 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 There is no adenopathy. Reproductive: Small calcified uterine fibroids. Other: Mild subcutaneous edema. Musculoskeletal: Osteopenia with degenerative changes of the spine. No acute osseous pathology. IMPRESSION: 1. Lobulated mass in the cecum concerning for malignancy. Further evaluation with colonoscopy recommended. No bowel obstruction. 2. Left ureteral stent with mild left hydronephrosis. A 5 mm proximal left ureteral stone as well as a curvilinear calculus in the region of the left renal pelvis. 3.  Sigmoid diverticulosis. 4. Cirrhosis. 5. 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 This is relatively similar to CTs dating back to 2014. CT with IV contrast may provide better evaluation on a nonemergent/outpatient basis, if clinically indicated. 6. Small bilateral pleural effusions with associated bibasilar subsegmental atelectasis versus infiltrate. 7. Aortic Atherosclerosis (ICD10-I70.0). Electronically Signed   By: Elgie Collard M.D.   On: 06/15/2020 18:33   DG Abd 1 View  Result Date: 06/27/2020 CLINICAL DATA:  Nausea and vomiting EXAM: ABDOMEN - 1 VIEW COMPARISON:  June 27, 2020 FINDINGS: Double-J stent is again noted on the left, unchanged in position. There remain loops of dilated bowel, similar to 1 day prior. No free air evident. Left lower lobe consolidation noted. IMPRESSION: Double-J stent on the left. Loops of dilated bowel remain. Suspect postoperative ileus, although a degree of bowel obstruction question. Opacity left lower lobe noted. Electronically Signed   By: Bretta Bang III M.D.   On: 06/27/2020 07:56   DG Abd 1 View  Result Date: 06/26/2020 CLINICAL DATA:  Abdominal pain EXAM: ABDOMEN - 1 VIEW COMPARISON:  June 25, 2020. FINDINGS: Double-J stent on the left is unchanged in position. Loops of dilated bowel are similar to 1 day prior. Surgical clips are noted in the right upper quadrant. There is calcification in the splenic artery. No appreciable free air on supine examination. No skin staples to the left of midline. IMPRESSION: Loops of dilated bowel, similar to 1 day prior. Suspect postoperative ileus. A degree of bowel obstruction cannot be excluded. Double-J stent unchanged in position on the left. Clips right upper quadrant noted. Electronically Signed   By: Bretta Bang III M.D.   On: 06/26/2020 09:10   DG Abd 1 View  Result Date: 06/25/2020 CLINICAL DATA:  Emesis. EXAM: ABDOMEN - 1 VIEW COMPARISON:  Acute abdominal series 06/24/2020  FINDINGS: Left pleural effusion and cardiomegaly scratched at left greater than right pleural effusions are stable. Cardiomegaly is again noted. Left ureteral stent is in place. Focal dilated loop of small bowel in the right lower quadrant is increasing caliber. Bowel loops in the left abdomen have improved. No free air is evident. IMPRESSION: 1. Increasing caliber of small bowel loop in the right lower quadrant. Question focal obstruction versus ileus. 2. Improving bowel gas pattern. 3. Stable left greater than right pleural effusions and cardiomegaly. Electronically Signed   By: Marin Roberts M.D.   On: 06/25/2020 06:49   CT CHEST WO CONTRAST  Result Date: 06/20/2020 CLINICAL DATA:  Colorectal cancer. EXAM: CT CHEST WITHOUT CONTRAST TECHNIQUE: Multidetector CT imaging of the chest was performed  following the standard protocol without IV contrast. COMPARISON:  June 15, 2020. FINDINGS: Cardiovascular: Atherosclerosis of thoracic aorta is noted without aneurysm formation. Mild cardiomegaly is noted. No pericardial effusion is noted. Left-sided pacemaker is noted. Status post aortic mitral valve repair. Mediastinum/Nodes: No enlarged mediastinal or axillary lymph nodes. Thyroid gland, trachea, and esophagus demonstrate no significant findings. Lungs/Pleura: No pneumothorax is noted. Small bilateral pleural effusions are noted with adjacent subsegmental atelectasis. Interval development of 10 mm subpleural rounded density seen posteriorly in the right lung apex best seen on image number 28 of series 5. This may simply represent scarring or focal atelectasis, but neoplasm cannot be excluded. Upper Abdomen: Nodular hepatic contours are noted suggesting possible hepatic cirrhosis. Musculoskeletal: No chest wall mass or suspicious bone lesions identified. IMPRESSION: 1. Interval development of 10 mm subpleural rounded density seen posteriorly in the right lung apex. This may simply represent scarring or focal  atelectasis, but neoplasm or metastatic lesion cannot be excluded. Follow-up unenhanced chest CT in 3 months is recommended to ensure stability or resolution. 2. Small bilateral pleural effusions are noted with adjacent subsegmental atelectasis. 3. Nodular hepatic contours are noted suggesting possible hepatic cirrhosis. Aortic Atherosclerosis (ICD10-I70.0). Electronically Signed   By: Marijo Conception M.D.   On: 06/20/2020 13:29   DG CHEST PORT 1 VIEW  Result Date: 07/10/2020 CLINICAL DATA:  Shortness of breath.  Postoperative exam. EXAM: PORTABLE CHEST 1 VIEW COMPARISON:  07/05/2020 FINDINGS: Stable right IJ central venous catheter. Median sternotomy and cardiac valve replacement. Left-sided AICD. Stable cardiomegaly. Atherosclerotic calcification of the aortic knob. Persistent left-sided pleural effusion. Hazy bibasilar opacities, similar to prior. No pneumothorax. IMPRESSION: Persistent left-sided pleural effusion with hazy bibasilar opacities, similar to prior. Electronically Signed   By: Davina Poke D.O.   On: 07/10/2020 13:37   DG CHEST PORT 1 VIEW  Result Date: 07/05/2020 CLINICAL DATA:  Wheezing. EXAM: PORTABLE CHEST 1 VIEW COMPARISON:  06/22/2020. FINDINGS: Right IJ line in stable position. AICD in stable position. Prior cardiac valve replacement. Cardiomegaly. No pulmonary venous congestion. Persistent unchanged left pleural effusion. Underlying atelectasis and or infiltrate cannot be excluded. Mild right infrahilar infiltrate cannot be excluded. No pneumothorax. IMPRESSION: 1.  Right IJ line in stable position. 2. AICD in stable position. Cardiomegaly. No pulmonary venous congestion. 3. Persistent unchanged left pleural effusion. Underlying atelectasis and or infiltrate cannot be excluded. Mild right infrahilar infiltrate cannot be excluded. Electronically Signed   By: Marcello Moores  Register   On: 07/05/2020 13:06   DG Chest Port 1 View  Result Date: 06/22/2020 CLINICAL DATA:  Central line  placement EXAM: PORTABLE CHEST 1 VIEW COMPARISON:  06/12/2019 FINDINGS: 1 LEFT-sided pacemaker insert non wires overlies stable enlarged cardiac silhouette. Moderate LEFT effusion noted. Interval placement of a central venous line tip in distal SVC. No pneumothorax. IMPRESSION: 1. Central line placement without complication. 2. Stable LEFT pleural effusion. Electronically Signed   By: Suzy Bouchard M.D.   On: 06/22/2020 12:36   DG ABD ACUTE 2+V W 1V CHEST  Result Date: 06/24/2020 CLINICAL DATA:  Vomiting EXAM: DG ABDOMEN ACUTE W/ 1V CHEST COMPARISON:  CT 06/15/2020 FINDINGS: Cardiomegaly. Left-sided pacer remains in place, unchanged. Left pleural effusion with left base atelectasis or infiltrate. Increasing right basilar opacity. Dilated small bowel loops within the abdomen and pelvis. Gas within the stomach and mildly prominent large bowel. No free air. No organomegaly. Left ureteral stent is in place. IMPRESSION: Cardiomegaly. Left pleural effusion with left lower lobe atelectasis or infiltrate. Increasing right basilar  atelectasis or infiltrate. Gas within mildly prominent small bowel loops and large bowel. Findings could reflect ileus. Bowel-gas pattern appears similar to prior CT. Electronically Signed   By: Rolm Baptise M.D.   On: 06/24/2020 19:57   DG Abd Portable 1V  Result Date: 07/01/2020 CLINICAL DATA:  Post right colectomy 9 days ago.  Emesis. EXAM: PORTABLE ABDOMEN - 1 VIEW COMPARISON:  06/28/2020 FINDINGS: Surgical clips over the right upper quadrant. Skin staples are present vertically over the left mid to lower abdomen. Double-J left-sided internal ureteral stent is unchanged. Bowel gas pattern demonstrates air throughout the colon. No free peritoneal air. Resolution of previously seen mildly dilated small bowel loops. Remainder of the exam is unchanged. IMPRESSION: 1. Nonobstructive bowel gas pattern. 2. Left-sided double-J internal ureteral stent unchanged. Electronically Signed   By:  Marin Olp M.D.   On: 07/01/2020 08:57   DG Abd Portable 1V  Result Date: 06/28/2020 CLINICAL DATA:  Ileus. EXAM: PORTABLE ABDOMEN - 1 VIEW COMPARISON:  06/27/2020 FINDINGS: Persistent air-filled mildly dilated small bowel loop in the left abdomen measuring 4 cm in diameter. Air is present throughout the colon. No free peritoneal air. Moderate gaseous distention of the stomach. Double-J left-sided internal ureteral stent unchanged. Remainder of the exam is unchanged. IMPRESSION: Persistent minimally dilated small bowel loops with air throughout the colon likely due to ileus. Electronically Signed   By: Marin Olp M.D.   On: 06/28/2020 07:58   DG C-Arm 1-60 Min-No Report  Result Date: 07/10/2020 Fluoroscopy was utilized by the requesting physician.  No radiographic interpretation.   ECHOCARDIOGRAM LIMITED  Result Date: 07/09/2020    ECHOCARDIOGRAM LIMITED REPORT   Patient Name:   [name] Date of Exam: 07/09/2020 Medical Rec #:  [id number]      Height:       62.0 in Accession #:    [id number]     Weight:       208.1 lb Date of Birth:  December 30, 1925     BSA:          1.944 m Patient Age:    37 years       BP:           90/46 mmHg Patient Gender: F              HR:           70 bpm. Exam Location:  Inpatient Procedure: Limited Echo, Color Doppler, Cardiac Doppler and Intracardiac            Opacification Agent Indications:    D42.87 Acute systolic (congestive) heart failure  History:        Patient has prior history of Echocardiogram examinations, most                 recent 04/05/2020. CHF, Pacemaker, Arrythmias:Atrial                 Fibrillation; Risk Factors:Hypertension. 2003 71m Mitral                 Annuloplasty Ring implanted.                  Mitral Valve: 28 mm prosthetic annuloplasty ring valve is                 present in the mitral position.  Sonographer:    ERaquel SarnaSenior RDCS Referring Phys: [id number] Heinz Sonographer Comments: Technically challenging study due to  limited acoustic windows. Limited  to assess systolic function, valves, and IVC. IMPRESSIONS  1. Left ventricular ejection fraction, by estimation, is 40 to 45%. The left ventricle has mildly decreased function. The left ventricle demonstrates global hypokinesis. Left ventricular diastolic function could not be evaluated.  2. Right ventricular systolic function is mildly reduced. The right ventricular size is mildly enlarged. There is moderately elevated pulmonary artery systolic pressure. The estimated right ventricular systolic pressure is 97.3 mmHg.  3. Left atrial size was severely dilated.  4. Right atrial size was severely dilated.  5. The mitral valve has been repaired/replaced. Trivial mitral valve regurgitation. The mean mitral valve gradient is 4.0 mmHg with average heart rate of 70 bpm. There is a 28 mm prosthetic annuloplasty ring present in the mitral position.  6. The pericardial effusion is posterior to the left ventricle. Moderate pleural effusion in the left lateral region.  7. The tricuspid valve is abnormal. Tricuspid valve regurgitation is moderate to severe.  8. The aortic valve is tricuspid. Mild aortic valve sclerosis is present, with no evidence of aortic valve stenosis.  9. The inferior vena cava is dilated in size with <50% respiratory variability, suggesting right atrial pressure of 15 mmHg. Comparison(s): Changes from prior study are noted. 04/05/20: LVEF 40%, severe TR. FINDINGS  Left Ventricle: Left ventricular ejection fraction, by estimation, is 40 to 45%. The left ventricle has mildly decreased function. The left ventricle demonstrates global hypokinesis. Definity contrast agent was given IV to delineate the left ventricular  endocardial borders. The left ventricular internal cavity size was normal in size. There is borderline left ventricular hypertrophy. Abnormal (paradoxical) septal motion, consistent with RV pacemaker. Left ventricular diastolic function could not be evaluated due  to paced rhythm. Right Ventricle: The right ventricular size is mildly enlarged. No increase in right ventricular wall thickness. Right ventricular systolic function is mildly reduced. There is moderately elevated pulmonary artery systolic pressure. The tricuspid regurgitant velocity is 2.76 m/s, and with an assumed right atrial pressure of 15 mmHg, the estimated right ventricular systolic pressure is 53.2 mmHg. Left Atrium: Left atrial size was severely dilated. Right Atrium: Right atrial size was severely dilated. Pericardium: A small pericardial effusion is present. The pericardial effusion is posterior to the left ventricle. Mitral Valve: The mitral valve has been repaired/replaced. Trivial mitral valve regurgitation. There is a 28 mm prosthetic annuloplasty ring present in the mitral position. MV peak gradient, 10.8 mmHg. The mean mitral valve gradient is 4.0 mmHg with average heart rate of 70 bpm. Tricuspid Valve: The tricuspid valve is abnormal. Tricuspid valve regurgitation is moderate to severe. Aortic Valve: The aortic valve is tricuspid. Mild aortic valve sclerosis is present, with no evidence of aortic valve stenosis. Pulmonic Valve: The pulmonic valve was grossly normal. Pulmonic valve regurgitation is trivial. Venous: The inferior vena cava is dilated in size with less than 50% respiratory variability, suggesting right atrial pressure of 15 mmHg. IAS/Shunts: No atrial level shunt detected by color flow Doppler. Additional Comments: A pacer wire is visualized. There is a moderate pleural effusion in the left lateral region. LEFT VENTRICLE PLAX 2D LVIDd:         4.90 cm LVIDs:         3.90 cm LV PW:         0.90 cm LV IVS:        1.00 cm LVOT diam:     1.80 cm LV SV:         36 LV SV Index:   18 LVOT  Area:     2.54 cm  RIGHT VENTRICLE RV S prime:     7.83 cm/s TAPSE (M-mode): 1.6 cm LEFT ATRIUM         Index LA diam:    4.70 cm 2.42 cm/m  AORTIC VALVE LVOT Vmax:   69.80 cm/s LVOT Vmean:  54.100 cm/s  LVOT VTI:    0.141 m MITRAL VALVE             TRICUSPID VALVE MV Area (PHT): 1.59 cm  TR Peak grad:   30.5 mmHg MV Peak grad:  10.8 mmHg TR Vmax:        276.00 cm/s MV Mean grad:  4.0 mmHg MV Vmax:       1.64 m/s  SHUNTS MV Vmean:      88.3 cm/s Systemic VTI:  0.14 m                          Systemic Diam: 1.80 cm Lyman Bishop MD Electronically signed by Lyman Bishop MD Signature Date/Time: 07/09/2020/10:04:57 AM    Final       Subjective: Chronically hard of hearing.  Denies complaints.  "Feel good".  Denies nausea, vomiting, pain, dyspnea or chest pain.  As per RN, no acute issues noted and is doing better compared to yesterday.  As discussed with daughter, patient seemed well when she spoke with her this morning and patient even stated to her that she was doing well which she had heard for the first time after a long time.  Discharge Exam:  Vitals:   07/13/20 1504 07/13/20 2031 07/14/20 0500 07/14/20 0505  BP: 106/60 113/64  (!) 102/52  Pulse: 68 68  70  Resp: (!) 23 (!) 24  18  Temp: 97.7 F (36.5 C) 97.7 F (36.5 C)  98 F (36.7 C)  TempSrc: Oral Oral    SpO2: 96% 98%  93%  Weight:   95.9 kg   Height:        General exam: Pleasant elderly female, moderately built and nourished lying comfortably propped up in bed without distress. Respiratory system: Slightly diminished breath sounds in the bases but no wheezing, rhonchi or crackles appreciated.  No increased work of breathing. Cardiovascular system: S1 & S2 heard, RRR. No JVD, murmurs, rubs, gallops or clicks.  Lower extremity edema is improved, now trace.  Minimal upper extremity pitting edema.  Telemetry personally reviewed: V paced rhythm.    Intermittent short episodes of NSVT. Gastrointestinal system: Abdomen is nondistended, soft and nontender. No organomegaly or masses felt. Normal bowel sounds heard.  Abdominal wound VAC in place without acute findings. Central nervous system: Alert and oriented x2. No focal neurological  deficits.  Hard of hearing. Extremities: Symmetric 5 x 5 power in upper extremities and at least grade 4 x 5 in lower extremities. Skin: No rashes, lesions or ulcers Psychiatry: Judgement and insight appear somewhat impaired. Mood & affect appropriate.     The results of significant diagnostics from this hospitalization (including imaging, microbiology, ancillary and laboratory) are listed below for reference.     Microbiology: No results found for this or any previous visit (from the past 240 hour(s)).   Labs: CBC: Recent Labs  Lab 07/08/20 0531 07/09/20 0304 07/10/20 0405 07/11/20 0417 07/12/20 0840  WBC  --   --  7.0 6.9 5.5  NEUTROABS  --   --  4.8  --   --   HGB 7.3* 7.3* 7.3* 7.4* 8.2*  HCT 25.5* 25.7* 25.2* 25.5* 28.7*  MCV  --   --  92.0 92.7 92.9  PLT  --   --  147* 142* 559    Basic Metabolic Panel: Recent Labs  Lab 07/07/20 1451 07/07/20 1451 07/08/20 0533 07/09/20 0304 07/09/20 2041 07/09/20 2041 07/10/20 0405 07/11/20 0417 07/12/20 0840 07/13/20 0534 07/14/20 0649  NA 145   < > 145   < > 146*   < > 142 145 146* 147* 144  K 4.0   < > 3.8   < > 4.0   < > 3.7 4.1 3.9 3.8 3.4*  CL 119*   < > 117*   < > 116*   < > 115* 114* 115* 116* 114*  CO2 20*   < > 20*   < > 20*   < > 20* 20* 19* 21* 20*  GLUCOSE 115*   < > 85   < > 84   < > 80 80 104* 84 79  BUN 32*   < > 33*   < > 45*   < > 43* 47* 53* 49* 57*  CREATININE 1.70*   < > 1.61*   < > 2.02*   < > 1.81* 1.86* 2.09* 2.07* 1.92*  CALCIUM 7.1*   < > 7.3*   < > 7.6*   < > 7.1* 7.2* 7.6* 7.6* 7.4*  MG  --   --   --   --  2.1  --  2.2  --  2.2  --   --   PHOS 2.8  --  3.1  --   --   --   --   --   --   --   --    < > = values in this interval not displayed.    Liver Function Tests: Recent Labs  Lab 07/07/20 1451 07/08/20 0533  ALBUMIN 2.2* 2.4*    CBG: Recent Labs  Lab 07/11/20 1651  GLUCAP 81    Urinalysis    Component Value Date/Time   COLORURINE AMBER (A) 07/02/2020 1056   APPEARANCEUR  CLOUDY (A) 07/02/2020 1056   LABSPEC 1.019 07/02/2020 1056   PHURINE 6.0 07/02/2020 1056   GLUCOSEU NEGATIVE 07/02/2020 1056   HGBUR LARGE (A) 07/02/2020 1056   BILIRUBINUR NEGATIVE 07/02/2020 1056   KETONESUR NEGATIVE 07/02/2020 1056   PROTEINUR 30 (A) 07/02/2020 1056   UROBILINOGEN 0.2 11/27/2013 1423   NITRITE NEGATIVE 07/02/2020 1056   LEUKOCYTESUR MODERATE (A) 07/02/2020 1056    I called and discussed with patient's daughter in detail, updated care and answered all questions.  I advised her that patient is being discharged to SNF today.  She was agreeable and verbalized understanding.  Time coordinating discharge: 60 minutes  SIGNED:  Vernell Leep, MD, [place], [facility]. [facility]  To contact the attending provider between 7A-7P or the covering provider during after hours 7P-7A, please log into the web site [email or web address] and access using universal [place] password for that web site. If you do not have the password, please call the hospital operator.

## 2020-07-14 NOTE — TOC Transition Note (Signed)
Transition of Care [facility]) - CM/SW Discharge Note   Patient Details  Name: [name] MRN: [id number] Date of Birth: 21-Oct-1926  [facility] CM/SW Contact:  Shade Flood, LCSW Phone Number: 07/14/2020, 12:57 PM   Clinical Narrative:     Per MD, stent will be removed today and pt can dc. Spoke with [place] at [facility] and they can accept pt today. They have wound vac for patient. She needs new negative COVID test before dc. Pt will go to room 603P and number for report is [phone].  Updated pt's daughter, Santiago Glad, by phone. She remains in agreement with the dc plan. She states pt has a bag of clothes in the closet in the hospital room. Updated pt's RN of same.  PTAR arranged for 1530 as requested by [place] at [facility]. Will update daughter.    DC packet at [employer] station. DC clinical will be sent electronically. RN to call report to [phone]  There are no other TOC needs for dc.  Final next level of care: Skilled Nursing Facility Barriers to Discharge: Barriers Resolved   Patient Goals and CMS Choice Patient states their goals for this hospitalization and ongoing recovery are:: go to rehab CMS Medicare.gov Compare Post Acute Care list provided to:: Patient Represenative (must comment) (dtr Santiago Glad) Choice offered to / list presented to : Adult Children  Discharge Placement   Existing PASRR number confirmed : 06/15/20          Patient chooses bed at: [facility] Patient to be transferred to facility by: [place] Name of family member notified: Santiago Glad (daughter) Patient and family notified of of transfer: 07/14/20  Discharge Plan and Services   Discharge Planning Services: CM Consult Post Acute Care Choice: [facility]                               Social Determinants of Health (SDOH) Interventions     Readmission Risk Interventions No flowsheet data found.

## 2020-07-14 NOTE — Progress Notes (Signed)
4 Days Post-Op  Subjective: [name] is doing well following left ureteroscopy with laser, stone extraction and stenting on 8/24.  She is doing well without flank pain or voiding difficulty.  She has no associated signs or symptoms.  ROS:  Review of Systems  Constitutional: Negative for fever.  Genitourinary: Negative for flank pain.    Anti-infectives: Anti-infectives (From admission, onward)   Start     Dose/Rate Route Frequency Ordered Stop   07/10/20 0930  cefTRIAXone (ROCEPHIN) 2 g in sodium chloride 0.9 % 100 mL IVPB        2 g 200 mL/hr over 30 Minutes Intravenous On call to O.R. 07/09/20 1610 07/10/20 1608   07/10/20 0645  cefTRIAXone (ROCEPHIN) 2 g in sodium chloride 0.9 % 100 mL IVPB  Status:  Discontinued        2 g 200 mL/hr over 30 Minutes Intravenous 30 min pre-op 07/10/20 0645 07/10/20 0719   06/28/20 1415  amoxicillin-clavulanate (AUGMENTIN) 500-125 MG per tablet 500 mg  Status:  Discontinued        1 tablet Oral 2 times daily 06/28/20 1316 07/03/20 1553   06/24/20 2200  piperacillin-tazobactam (ZOSYN) IVPB 3.375 g  Status:  Discontinued        3.375 g 12.5 mL/hr over 240 Minutes Intravenous Every 8 hours 06/24/20 2056 06/28/20 1316   06/22/20 0911  sodium chloride 0.9 % with cefoTEtan (CEFOTAN) ADS Med       Note to Pharmacy: Georgena Spurling   : cabinet override      06/22/20 0911 06/22/20 1020   06/21/20 1115  cefoTEtan (CEFOTAN) 2 g in sodium chloride 0.9 % 100 mL IVPB        2 g 200 mL/hr over 30 Minutes Intravenous On call to O.R. 06/20/20 1346 06/22/20 0559   06/20/20 2200  amoxicillin (AMOXIL) capsule 500 mg  Status:  Discontinued        500 mg Oral Every 8 hours 06/20/20 1950 06/24/20 0725   06/14/20 1000  amoxicillin (AMOXIL) capsule 500 mg        500 mg Oral Every 12 hours 06/14/20 0741 06/19/20 0959   06/11/20 1030  cefTRIAXone (ROCEPHIN) 1 g in sodium chloride 0.9 % 100 mL IVPB  Status:  Discontinued        1 g 200 mL/hr over 30 Minutes Intravenous Every 24  hours 06/11/20 1021 06/14/20 0741   06/11/20 0300  cefTRIAXone (ROCEPHIN) 1 g in sodium chloride 0.9 % 100 mL IVPB        1 g 200 mL/hr over 30 Minutes Intravenous  Once 06/11/20 0259 06/11/20 0458      Current Facility-Administered Medications  Medication Dose Route Frequency Provider Last Rate Last Admin  . (feeding supplement) PROSource Plus liquid 30 mL  30 mL Oral BID BM Irine Seal, MD   30 mL at 07/13/20 1513  . acetaminophen (TYLENOL) tablet 1,000 mg  1,000 mg Oral Q6H Irine Seal, MD   1,000 mg at 07/14/20 0526  . calcium-vitamin D (OSCAL WITH D) 500-200 MG-UNIT per tablet 1 tablet  1 tablet Oral TID Irine Seal, MD   1 tablet at 07/13/20 2159  . feeding supplement (BOOST / RESOURCE BREEZE) liquid 1 Container  1 Container Oral TID BM Irine Seal, MD   1 Container at 07/13/20 1957  . HYDROmorphone (DILAUDID) injection 0.5-1 mg  0.5-1 mg Intravenous Q1H PRN Irine Seal, MD   1 mg at 06/25/20 0851  . levalbuterol (XOPENEX) nebulizer solution 0.63 mg  0.63 mg Nebulization Q6H PRN Irine Seal, MD      . metoprolol tartrate (LOPRESSOR) injection 5 mg  5 mg Intravenous Q6H PRN Irine Seal, MD      . midodrine (PROAMATINE) tablet 7.5 mg  7.5 mg Oral TID WC Hongalgi, Anand D, MD   7.5 mg at 07/13/20 1800  . multivitamin with minerals tablet 1 tablet  1 tablet Oral Daily Irine Seal, MD   1 tablet at 07/13/20 0941  . ondansetron (ZOFRAN) injection 4 mg  4 mg Intravenous Q4H PRN Irine Seal, MD   4 mg at 07/13/20 0901  . oxyCODONE (Oxy IR/ROXICODONE) immediate release tablet 2.5 mg  2.5 mg Oral Q4H PRN Irine Seal, MD   2.5 mg at 06/27/20 2224  . pantoprazole (PROTONIX) EC tablet 40 mg  40 mg Oral Daily Irine Seal, MD   40 mg at 07/13/20 0941  . sodium bicarbonate tablet 650 mg  650 mg Oral BID Irine Seal, MD   650 mg at 07/13/20 2159  . sodium chloride flush (NS) 0.9 % injection 10-40 mL  10-40 mL Intracatheter PRN Irine Seal, MD   10 mL at 07/11/20 0419  . sodium chloride flush (NS) 0.9 %  injection 3 mL  3 mL Intravenous Q12H Irine Seal, MD   3 mL at 07/13/20 2203  . torsemide (DEMADEX) tablet 20 mg  20 mg Oral Daily Sande Rives E, PA-C   20 mg at 07/13/20 1146     Objective: Vital signs in last 24 hours: Temp:  [97.7 F (36.5 C)-98 F (36.7 C)] 98 F (36.7 C) (08/28 0505) Pulse Rate:  [68-70] 70 (08/28 0505) Resp:  [18-24] 18 (08/28 0505) BP: (102-113)/(52-64) 102/52 (08/28 0505) SpO2:  [93 %-98 %] 93 % (08/28 0505) Weight:  [95.9 kg] 95.9 kg (08/28 0500)  Intake/Output from previous day: 08/27 0701 - 08/28 0700 In: 483 [P.O.:477; I.V.:6] Out: 1000 [Urine:650; Drains:350] Intake/Output this shift: No intake/output data recorded.   Physical Exam Vitals reviewed.  Constitutional:      Appearance: Normal appearance.  Neurological:     Mental Status: She is alert.     Lab Results:  Recent Labs    07/12/20 0840  WBC 5.5  HGB 8.2*  HCT 28.7*  PLT 150   BMET Recent Labs    07/13/20 0534 07/14/20 0649  NA 147* 144  K 3.8 3.4*  CL 116* 114*  CO2 21* 20*  GLUCOSE 84 79  BUN 49* 57*  CREATININE 2.07* 1.92*  CALCIUM 7.6* 7.4*   PT/INR No results for input(s): LABPROT, INR in the last 72 hours. ABG No results for input(s): PHART, HCO3 in the last 72 hours.  Invalid input(s): PCO2, PO2  Studies/Results:    Assessment and Plan: Multiple left ureteral stones.   She is doing well s/p Ureteroscopy.  I will remove her stent which has a tether at the bedside tomorrow.     LOS: 33 days    Irine Seal 07/14/2020 [phone] ID: Bennetta Laos, female   DOB: 19-Sep-1926, 86 y.o.   MRN: [id number] Patient ID: PRAPTI GRUSSING, female   DOB: October 10, 1926, 86 y.o.   MRN: [id number]

## 2020-07-14 NOTE — Progress Notes (Addendum)
Progress Note  Patient Name: [name] Date of Encounter: 07/14/2020  [facility] Cardiologist: [name], MD   Subjective   No chest pain, no shortness of breath  Inpatient Medications    Scheduled Meds: . (feeding supplement) PROSource Plus  30 mL Oral BID BM  . acetaminophen  1,000 mg Oral Q6H  . calcium-vitamin D  1 tablet Oral TID  . feeding supplement  1 Container Oral TID BM  . midodrine  7.5 mg Oral TID WC  . multivitamin with minerals  1 tablet Oral Daily  . pantoprazole  40 mg Oral Daily  . sodium bicarbonate  650 mg Oral BID  . sodium chloride flush  3 mL Intravenous Q12H  . torsemide  20 mg Oral Daily   Continuous Infusions:  PRN Meds: HYDROmorphone (DILAUDID) injection, levalbuterol, metoprolol tartrate, ondansetron (ZOFRAN) IV, oxyCODONE, sodium chloride flush   Vital Signs    Vitals:   07/13/20 1504 07/13/20 2031 07/14/20 0500 07/14/20 0505  BP: 106/60 113/64  (!) 102/52  Pulse: 68 68  70  Resp: (!) 23 (!) 24  18  Temp: 97.7 F (36.5 C) 97.7 F (36.5 C)  98 F (36.7 C)  TempSrc: Oral Oral    SpO2: 96% 98%  93%  Weight:   95.9 kg   Height:        Intake/Output Summary (Last 24 hours) at 07/14/2020 0859 Last data filed at 07/14/2020 0530 Gross per 24 hour  Intake 483 ml  Output 1000 ml  Net -517 ml   Last 3 Weights 07/14/2020 07/12/2020 07/11/2020  Weight (lbs) 211 lb 6.7 oz 210 lb 1.6 oz 210 lb 12.2 oz  Weight (kg) 95.9 kg 95.301 kg 95.6 kg      Telemetry    Ventricular pacing, has had prior short runs of NSVT 14 beats- Personally Reviewed  ECG    No new- Personally Reviewed  Physical Exam  Elderly GEN: No acute distress.   Neck: No JVD Cardiac: RRR, no murmurs, rubs, or gallops.  Respiratory: Clear to auscultation bilaterally. GI: Soft, nontender, non-distended  MS:  Minor lower extremity edema; No deformity. Neuro:  Nonfocal  Psych: Normal affect   Labs    High Sensitivity Troponin:  No results for input(s):  TROPONINIHS in the last 720 hours.    Chemistry Recent Labs  Lab 07/07/20 1451 07/07/20 1451 07/08/20 0533 07/09/20 0304 07/12/20 0840 07/13/20 0534 07/14/20 0649  NA 145   < > 145   < > 146* 147* 144  K 4.0   < > 3.8   < > 3.9 3.8 3.4*  CL 119*   < > 117*   < > 115* 116* 114*  CO2 20*   < > 20*   < > 19* 21* 20*  GLUCOSE 115*   < > 85   < > 104* 84 79  BUN 32*   < > 33*   < > 53* 49* 57*  CREATININE 1.70*   < > 1.61*   < > 2.09* 2.07* 1.92*  CALCIUM 7.1*   < > 7.3*   < > 7.6* 7.6* 7.4*  ALBUMIN 2.2*  --  2.4*  --   --   --   --   GFRNONAA 26*   < > 28*   < > 20* 20* 22*  GFRAA 30*   < > 32*   < > 23* 24* 26*  ANIONGAP 6   < > 8   < > 12 10  10   < > = values in this interval not displayed.     Hematology Recent Labs  Lab 07/10/20 0405 07/11/20 0417 07/12/20 0840  WBC 7.0 6.9 5.5  RBC 2.74* 2.75* 3.09*  HGB 7.3* 7.4* 8.2*  HCT 25.2* 25.5* 28.7*  MCV 92.0 92.7 92.9  MCH 26.6 26.9 26.5  MCHC 29.0* 29.0* 28.6*  RDW 24.0* 23.9* 23.9*  PLT 147* 142* 150    BNP Recent Labs  Lab 07/09/20 2041  BNP 476.5*     DDimer No results for input(s): DDIMER in the last 168 hours.   Radiology    No results found.  Cardiac Studies   EF 45% mitral valve repair  Patient Profile     86 y.o. female pacemaker here with acute on chronic systolic heart failure in the setting of cecal adenocarcinoma status post colectomy postop ileus  Assessment & Plan   . Acute on chronic systolic heart failure -EF 45%, diuresis limited by hypotension now on p.o. torsemide 20 mg a day.  Continue with gentle diuresis  Permanent atrial fibrillation -Well controlled, pacemaker for backup.  No longer on Toprol because of hypotension.  Nonsustained VT -Infrequent short runs.  Continue to maintain potassium and magnesium.  No longer on Toprol because of hypotension.  Tachybradycardia syndrome -Biventricular pacemaker.  Chronic hypotension -Midodrine for blood pressure support.  7.5 mg 3  times daily.  CKD stage IIIb -Was creatinine of 3.16 on admission.  Now improved 1.92.  Stable from cardiac perspective for DC. Other issues may preclude this.       For questions or updates, please contact [place] Please consult [email or web address] for contact info under        Signed, Candee Furbish, MD  07/14/2020, 8:59 AM

## 2020-07-16 LAB — CALCULI, WITH PHOTOGRAPH (CLINICAL LAB)
Calcium Oxalate Dihydrate: 20 %
Calcium Oxalate Monohydrate: 80 %
Weight Calculi: 161 mg

## 2020-07-17 NOTE — Anesthesia Postprocedure Evaluation (Signed)
Anesthesia Post Note  Patient: [name]  Procedure(s) Performed: LEFT URETEROSCOPY/HOLMIUM LASER/STENT EXCHANGE (Left )     Patient location during evaluation: PACU Anesthesia Type: General Level of consciousness: awake Pain management: pain level controlled Vital Signs Assessment: post-procedure vital signs reviewed and stable Respiratory status: spontaneous breathing Cardiovascular status: stable Postop Assessment: no apparent nausea or vomiting Anesthetic complications: no   No complications documented.  Last Vitals:  Vitals:   07/14/20 0505 07/14/20 1315  BP: (!) 102/52 (!) 102/49  Pulse: 70 69  Resp: 18 20  Temp: 36.7 C (!) 36.4 C  SpO2: 93% 98%    Last Pain:  Vitals:   07/14/20 1628  TempSrc:   PainSc: 0-No pain                 Huston Foley

## 2020-07-19 ENCOUNTER — Ambulatory Visit: Payer: Medicare Other | Admitting: Cardiology

## 2020-07-19 NOTE — Progress Notes (Deleted)
Date:  07/19/2020   ID:  [name], DOB 1926-09-23, MRN [id number]  PCP:  [name], MD  Cardiologist:  [name], MD  Electrophysiologist:  None   Chief Complaint:  MR/MS, permanent atrial fibrillation, CHF  History of Present Illness:     [name] a 86 y.o.femalewith a hx of chronic combined systolic/diastolic CHF (EF 63-78% by echo 04/2016), tachy brady syndrome (s/p BiV PPM insertion), severe MR s/p MV repair in 2003 (mild to moderate MS on echo 04/2016), Stage 3 CKD, chronic atrial fibrillation (not on anticoagulation secondary to GIB), hypotension (on midodrine),and moderate pulmonary HTN. Most recentechocardiogram, 04/2016,showed an improved EF of 50-55% with mild to moderate mitral stenosis. Due to hypotension she isunable to tolerate BB or ACE- I. She has a hx of syncope related to orthostasis and dehydration and is on Midodrine.  She was recently admitted to [facility] for urosepsis with obstructive uropathy from ureteral stone and underwent stent placement.  This hospitalization was complicated by aspiration PNA.  She was also dx with Cecal adenoCA by colonoscopy and she underwent laparoscopic right hemicolectomy on 8.5.2021 with possible metastatic dx to the lung and is now followed by Oncology. During hospitalization she has some volume overload with soft BPs and was treated with IV lasix and Midodrine uptitrated for soft BP.    She is here today for followup and is doing well.  She denies any chest pain or pressure, SOB, DOE, PND, orthopnea, LE edema, dizziness, palpitations or syncope. She is compliant with her meds and is tolerating meds with no SE.    Prior CV studies:   The following studies were reviewed today:  IMPRESSIONS  1. Left ventricular ejection fraction, by estimation, is 40%. The left  ventricle has mildly decreased function. The left ventricle demonstrates  global hypokinesis. The left ventricular internal cavity size was mildly  dilated.  Left ventricular diastolic  parameters are indeterminate.  2. Right ventricular systolic function is moderately reduced. The right  ventricular size is moderately enlarged. The estimated right ventricular  systolic pressure is 58.8 mmHg.  3. Left atrial size was severely dilated.  4. Right atrial size was severely dilated.  5. The mitral valve has been repaired/replaced. Mild mitral valve  regurgitation. No evidence of mitral stenosis however Doppler alignment of  diastolic gradient is slightly suboptimal. The mean mitral valve gradient  is 2.0 mmHg with average heart rate of  70 bpm.  6. Tricuspid valve regurgitation is severe.  7. The aortic valve is tricuspid. Aortic valve regurgitation is mild. No  aortic stenosis is present.  8. The inferior vena cava is dilated in size with <50% respiratory  variability, suggesting right atrial pressure of 15 mmHg.   Past Medical History:  Diagnosis Date  . Acute lower UTI 11/27/2013  . Acute on chronic renal insufficiency   . Anemia   . Brady-tachy syndrome ([place])    a. 2003: post-op afib after MR repair then symptomatic bradycardia requiring pacemaker. b. Upgrade to [employer] Bi-V Pacemaker 2007.  . Bronchiectasis with acute exacerbation ([place])   . Cancer of cecum ([place])   . Cellulitis and abscess of foot 08/15/2015   RT FOOT  . Chronic atrial fibrillation ([place])    a. Previously failed DCCV/amio/tikosyn.  CHADS2VASC score 5 - not on anticoagulation due to history of GI bleeding.    . Chronic combined systolic and diastolic CHF (congestive heart failure) (HCC)    a. EF 35-40% on echo 09/2013 b. echo 04/2016: EF 50-55%  with mild to moderate MS.  [name] Chronic diastolic CHF (congestive heart failure) ([place])    EF 35-40% on echo 09/2013 but now 50-55% by echo 04/2016  . Cirrhosis ([place])    a. Newly recognized 09/2013 - by CT.  . CKD (chronic kidney disease)   . Emesis   . Gastritis with hemorrhage   . GI bleeding   . Gout 11/07/2013  .  History of gout 08/15/2015  . Hydronephrosis of left kidney 06/12/2020  . Hypertension   . Iron deficiency anemia due to chronic blood loss 06/21/2020  . Left ureteral stone 06/12/2020  . Liver cirrhosis ([place]) 10/10/2013  . Mitral valve disorder    a. Severe MR s/p repair 2003 (28 mm annuloplasty ring and and oversew of LAA). No CAD by cath at that time.  [name] NSVT (nonsustained ventricular tachycardia) ([place])    a. Isolated event during 09/2007 adm.  . NSVT (nonsustained ventricular tachycardia) ([place])   . Orthostatic hypotension 10/31/2013  . Orthostatic hypotension dysautonomic syndrome ([place])   . Permanent atrial fibrillation ([place]) 10/28/2013  . Pulmonary hypertension (HCC)    Group II secondary to CHF and MV disease  . Respiratory distress 10/06/2017  . Sepsis secondary to UTI ([place]) 06/12/2020  . Spinal stenosis    shes recieved epidural steroid injections in the past  . Syncope and collapse 04/04/2020  . Tricuspid regurgitation    Past Surgical History:  Procedure Laterality Date  . APPENDECTOMY    . BALLOON DILATION N/A 06/05/2020   Procedure: BALLOON DILATION;  Surgeon: Otis Brace, MD;  Location: [facility];  Service: Gastroenterology;  Laterality: N/A;  . BIOPSY  06/05/2020   Procedure: BIOPSY;  Surgeon: Otis Brace, MD;  Location: [facility];  Service: Gastroenterology;;  . BIOPSY  06/18/2020   Procedure: BIOPSY;  Surgeon: Wilford Corner, MD;  Location: [facility];  Service: Endoscopy;;  . BIV PACEMAKER GENERATOR CHANGE OUT N/A 12/13/2014   Procedure: BIV PACEMAKER GENERATOR CHANGE OUT;  Surgeon: Evans Lance, MD;  Location[facility] LAB;  Service: Cardiovascular;  Laterality: N/A;  . CHOLECYSTECTOMY    . COLONOSCOPY N/A 06/18/2020   Procedure: COLONOSCOPY;  Surgeon: Wilford Corner, MD;  Location: [facility];  Service: Endoscopy;  Laterality: N/A;  Either to be done by Dr. Michail Sermon or Dr. Paulita Fujita  . CYSTOSCOPY WITH STENT PLACEMENT Left 06/11/2020   Procedure:  CYSTOSCOPY WITH STENT PLACEMENT;  Surgeon: Irine Seal, MD;  Location: [facility];  Service: Urology;  Laterality: Left;  . CYSTOSCOPY/URETEROSCOPY/HOLMIUM LASER/STENT PLACEMENT Left 07/10/2020   Procedure: LEFT URETEROSCOPY/HOLMIUM LASER/STENT EXCHANGE;  Surgeon: Irine Seal, MD;  Location: [facility];  Service: Urology;  Laterality: Left;  . ESOPHAGOGASTRODUODENOSCOPY (EGD) WITH PROPOFOL N/A 06/05/2020   Procedure: ESOPHAGOGASTRODUODENOSCOPY (EGD) WITH PROPOFOL;  Surgeon: Otis Brace, MD;  Location: [facility];  Service: Gastroenterology;  Laterality: N/A;  . IRRIGATION AND DEBRIDEMENT ABSCESS N/A 10/14/2013   Procedure: IRRIGATION AND DEBRIDEMENT PERINEAL ABSCESS;  Surgeon: Rolm Bookbinder, MD;  Location: [facility];  Service: General;  Laterality: N/A;  . LAPAROSCOPIC RIGHT COLECTOMY Right 06/22/2020   Procedure: LAPAROSCOPIC RIGHT COLECTOMY;  Surgeon: Jovita Kussmaul, MD;  Location: [facility];  Service: General;  Laterality: Right;  . SUBMUCOSAL TATTOO INJECTION  06/18/2020   Procedure: SUBMUCOSAL TATTOO INJECTION;  Surgeon: Wilford Corner, MD;  Location: [facility];  Service: Endoscopy;;  . VALVE REPLACEMENT     sever mitral regurgitation s/p mitral valve annuloplasty ring     No outpatient medications have been marked as taking for the 07/19/20  encounter (Appointment) with Sherry Margarita, MD.     Allergies:   Patient has no known allergies.   Social History   Tobacco Use  . Smoking status: Former Smoker    Quit date: 10/11/1971    Years since quitting: 48.8  . Smokeless tobacco: Never Used  Substance Use Topics  . Alcohol use: Yes    Comment: rare  . Drug use: No     Family Hx: [name] family history includes Other in her mother; Pancreatic cancer in her father.  ROS:   Please see the history of present illness.     All other systems reviewed and are negative.   Labs/Other Tests and Data Reviewed:    Recent Labs: 07/06/2020: ALT 10 07/08/2020: TSH 7.164 07/09/2020: B  Natriuretic Peptide 476.5 07/12/2020: Hemoglobin 8.2; Magnesium 2.2; Platelets 150 07/14/2020: BUN 57; Creatinine, Ser 1.92; Potassium 3.4; Sodium 144   Recent Lipid Panel No results found for: CHOL, TRIG, HDL, CHOLHDL, LDLCALC, LDLDIRECT  Wt Readings from Last 3 Encounters:  07/14/20 211 lb 6.7 oz (95.9 kg)  06/05/20 190 lb (86.2 kg)  04/11/20 184 lb (83.5 kg)     Objective:    Vital Signs:  LMP 10/10/2013    GEN: Well nourished, well developed in no acute distress HEENT: Normal NECK: No JVD; No carotid bruits LYMPHATICS: No lymphadenopathy CARDIAC:RRR, no murmurs, rubs, gallops RESPIRATORY:  Clear to auscultation without rales, wheezing or rhonchi  ABDOMEN: Soft, non-tender, non-distended MUSCULOSKELETAL:  No edema; No deformity  SKIN: Warm and dry NEUROLOGIC:  Alert and oriented x 3 PSYCHIATRIC:  Normal affect   ASSESSMENT & PLAN:    1.  Permanent atrial fibrillation  -HR remains well controlled on exam -she has not had any palpitations -not a candidate for longterm anticoagulation due to hx of GI bleed and recent dx of cecal adenoCA -not on BB or CCB due to hx of orthostatic hypotension and HR has been adequately controlled    2.  Chronic combined systolic/diastolic CHF  -she appears euvolemic on exam today but recently had some problems with volume overload with recent hospitalization -denies any SOB or LE edema and weight is stable  -continue Demadex 20mg  daily -SCr 1.92 on 07/14/2020  3.  Orthostatic hypotension  -she has a remote history of syncope in the setting of dehydration.   -she is on Midodrine which was recently increased for soft BP -continue Midodrine 7.5mg  TID  4.  Nonsustained ventricular tachycardia  -denies any palpitations - EF 50 to 55% on echo 2017.   -2D echo with EF 40-45% by recent echo>>she has not had any anginal sx or CHF sx.  5.  Severe mitral regurgitation  -s/p mitral valve repair in 2003.   -echo 03/2020 showed mild MR with no  MS  -She is completely asymptomatic.    6.  Chronic kidney disease stage III -SCr has increased to 1.92 a few days ago  7.  Severe TR -given advanced age would not pursue further workup of this  8.  Anemia/Cecal AdenoCA -noted to have Hbg in the 8 range and dx with adenoCa s/p right hemicolectomy -followed by Oncology  Medication Adjustments/Labs and Tests Ordered: Current medicines are reviewed at length with the patient today.  Concerns regarding medicines are outlined above.  Tests Ordered: No orders of the defined types were placed in this encounter.  Medication Changes: No orders of the defined types were placed in this encounter.   Disposition:  Follow up in 6 month(s)  Signed, [name], MD  07/19/2020 11:29 AM    [place]

## 2020-07-24 ENCOUNTER — Other Ambulatory Visit: Payer: Self-pay | Admitting: Hematology and Oncology

## 2020-07-24 DIAGNOSIS — C187 Malignant neoplasm of sigmoid colon: Secondary | ICD-10-CM

## 2020-07-25 ENCOUNTER — Inpatient Hospital Stay: Payer: Medicare Other | Attending: Hematology and Oncology | Admitting: Hematology and Oncology

## 2020-07-25 ENCOUNTER — Encounter: Payer: Self-pay | Admitting: Hematology and Oncology

## 2020-07-25 ENCOUNTER — Inpatient Hospital Stay: Payer: Medicare Other

## 2020-07-25 ENCOUNTER — Other Ambulatory Visit: Payer: Self-pay

## 2020-07-25 VITALS — BP 103/56 | HR 74 | Temp 97.9°F | Resp 17 | Ht 62.0 in | Wt 203.0 lb

## 2020-07-25 DIAGNOSIS — I4821 Permanent atrial fibrillation: Secondary | ICD-10-CM | POA: Insufficient documentation

## 2020-07-25 DIAGNOSIS — K7460 Unspecified cirrhosis of liver: Secondary | ICD-10-CM | POA: Insufficient documentation

## 2020-07-25 DIAGNOSIS — C187 Malignant neoplasm of sigmoid colon: Secondary | ICD-10-CM | POA: Diagnosis not present

## 2020-07-25 DIAGNOSIS — Z9049 Acquired absence of other specified parts of digestive tract: Secondary | ICD-10-CM | POA: Insufficient documentation

## 2020-07-25 DIAGNOSIS — I130 Hypertensive heart and chronic kidney disease with heart failure and stage 1 through stage 4 chronic kidney disease, or unspecified chronic kidney disease: Secondary | ICD-10-CM | POA: Insufficient documentation

## 2020-07-25 DIAGNOSIS — C180 Malignant neoplasm of cecum: Secondary | ICD-10-CM | POA: Insufficient documentation

## 2020-07-25 DIAGNOSIS — R0602 Shortness of breath: Secondary | ICD-10-CM | POA: Diagnosis not present

## 2020-07-25 DIAGNOSIS — Z87891 Personal history of nicotine dependence: Secondary | ICD-10-CM | POA: Insufficient documentation

## 2020-07-25 DIAGNOSIS — Z79899 Other long term (current) drug therapy: Secondary | ICD-10-CM | POA: Insufficient documentation

## 2020-07-25 DIAGNOSIS — I5042 Chronic combined systolic (congestive) and diastolic (congestive) heart failure: Secondary | ICD-10-CM | POA: Insufficient documentation

## 2020-07-25 DIAGNOSIS — N189 Chronic kidney disease, unspecified: Secondary | ICD-10-CM | POA: Insufficient documentation

## 2020-07-25 LAB — CBC WITH DIFFERENTIAL (CANCER CENTER ONLY)
Abs Immature Granulocytes: 0.02 10*3/uL (ref 0.00–0.07)
Basophils Absolute: 0 10*3/uL (ref 0.0–0.1)
Basophils Relative: 1 %
Eosinophils Absolute: 0.2 10*3/uL (ref 0.0–0.5)
Eosinophils Relative: 3 %
HCT: 32.7 % — ABNORMAL LOW (ref 36.0–46.0)
Hemoglobin: 9.3 g/dL — ABNORMAL LOW (ref 12.0–15.0)
Immature Granulocytes: 0 %
Lymphocytes Relative: 31 %
Lymphs Abs: 1.8 10*3/uL (ref 0.7–4.0)
MCH: 26.5 pg (ref 26.0–34.0)
MCHC: 28.4 g/dL — ABNORMAL LOW (ref 30.0–36.0)
MCV: 93.2 fL (ref 80.0–100.0)
Monocytes Absolute: 0.5 10*3/uL (ref 0.1–1.0)
Monocytes Relative: 8 %
Neutro Abs: 3.3 10*3/uL (ref 1.7–7.7)
Neutrophils Relative %: 57 %
Platelet Count: 155 10*3/uL (ref 150–400)
RBC: 3.51 MIL/uL — ABNORMAL LOW (ref 3.87–5.11)
RDW: 23.1 % — ABNORMAL HIGH (ref 11.5–15.5)
WBC Count: 5.7 10*3/uL (ref 4.0–10.5)
nRBC: 0 % (ref 0.0–0.2)

## 2020-07-25 LAB — RETIC PANEL
Immature Retic Fract: 21.4 % — ABNORMAL HIGH (ref 2.3–15.9)
RBC.: 3.56 MIL/uL — ABNORMAL LOW (ref 3.87–5.11)
Retic Count, Absolute: 75.5 10*3/uL (ref 19.0–186.0)
Retic Ct Pct: 2.1 % (ref 0.4–3.1)
Reticulocyte Hemoglobin: 26.7 pg — ABNORMAL LOW (ref >27.9)

## 2020-07-25 LAB — CMP (CANCER CENTER ONLY)
ALT: 13 U/L (ref 0–44)
AST: 18 U/L (ref 15–41)
Albumin: 2.1 g/dL — ABNORMAL LOW (ref 3.5–5.0)
Alkaline Phosphatase: 128 U/L — ABNORMAL HIGH (ref 38–126)
Anion gap: 9 (ref 5–15)
BUN: 74 mg/dL — ABNORMAL HIGH (ref 8–23)
CO2: 23 mmol/L (ref 22–32)
Calcium: 7.8 mg/dL — ABNORMAL LOW (ref 8.9–10.3)
Chloride: 117 mmol/L — ABNORMAL HIGH (ref 98–111)
Creatinine: 1.76 mg/dL — ABNORMAL HIGH (ref 0.44–1.00)
GFR, Est AFR Am: 29 mL/min — ABNORMAL LOW (ref >60)
GFR, Estimated: 25 mL/min — ABNORMAL LOW (ref >60)
Glucose, Bld: 78 mg/dL (ref 70–99)
Potassium: 4.4 mmol/L (ref 3.5–5.1)
Sodium: 149 mmol/L — ABNORMAL HIGH (ref 135–145)
Total Bilirubin: 0.5 mg/dL (ref 0.3–1.2)
Total Protein: 5.1 g/dL — ABNORMAL LOW (ref 6.5–8.1)

## 2020-07-25 LAB — IRON AND TIBC
Iron: 30 ug/dL — ABNORMAL LOW (ref 41–142)
Saturation Ratios: 18 % — ABNORMAL LOW (ref 21–57)
TIBC: 165 ug/dL — ABNORMAL LOW (ref 236–444)
UIBC: 135 ug/dL (ref 120–384)

## 2020-07-25 LAB — CEA (IN HOUSE-CHCC): CEA (CHCC-In House): 28.47 ng/mL — ABNORMAL HIGH (ref 0.00–5.00)

## 2020-07-25 LAB — FERRITIN: Ferritin: 312 ng/mL — ABNORMAL HIGH (ref 11–307)

## 2020-07-25 NOTE — Progress Notes (Signed)
[place] [phone]   [phone]  PROGRESS NOTE  Patient Care Team: [name], MD as PCP - General (Internal Medicine) Sueanne Margarita, MD as PCP - Cardiology (Cardiology)  Hematological/Oncological History # Adenocarcinoma of the Colon (pT3pN0M0) Stage II s/p resection 1) 06/11/2020: presented to the ED with painful urination and abdominal pain. CT abdomen showed moderate hydronephrosis with calculi in the mid ureteral region. Concern for mass-like appearance in cecal area.    2) 06/15/2020:  CT A/P showed a 5.6 x 3.5 cm lobulated mass in the cecum as well as left sided hydronephrosis.  3) 06/18/2020: colonoscopy was performed, biopsy showed lesion to be adenocarcinoma  4) 06/20/2020: establish care with Dr. Lorenso Courier while in hospital.  5) 06/22/2020: surgical resection of colonic adenocarcinoma. Pathology confirms pT3pN0 disease 6) 07/25/2020: enter surveillance for resected colon cancer  Interval History:  [name] 86 y.o. female with medical history significant for Stage II colon cancer s/p resection who presents for a follow up visit. [name] last visit was while she was admitted for her surgery. In the interim since the last visit she has been discharged with a wound vac to rehab.   On exam today [name] is accompanied by her daughter. She is recovering well from her surgery though she still has a wound VAC in place.  Her incision site is clear dry and intact with no erythema or overt signs of infection.  She reports that she is getting physically better working with the folks at the rehabilitation center.  She has had bouts of confusion with both good and bad days.  Her daughter reports that she occasionally "talks nonsense".  Though on exam today the patient was able to answer questions and show the she was alert and oriented x3.  She has had a decline in her appetite and a drop in her weight from 211 down to 203.  This is also been  accompanied with fluid overload for which she has been diuresed.  This is also been a struggle because of her low blood pressure.  The patient reports that she has been having normal bowel movements without any problems with constipation or diarrhea.  She has had no change in the color of her stools and has been otherwise quite stable.  A full 10 point ROS is listed below.  MEDICAL HISTORY:  Past Medical History:  Diagnosis Date  . Acute lower UTI 11/27/2013  . Acute on chronic renal insufficiency   . Anemia   . Brady-tachy syndrome ([place])    a. 2003: post-op afib after MR repair then symptomatic bradycardia requiring pacemaker. b. Upgrade to Medtronic Bi-V Pacemaker 2007.  . Bronchiectasis with acute exacerbation ([place])   . Cancer of cecum ([place])   . Cellulitis and abscess of foot 08/15/2015   RT FOOT  . Chronic atrial fibrillation ([place])    a. Previously failed DCCV/amio/tikosyn.  CHADS2VASC score 5 - not on anticoagulation due to history of GI bleeding.    . Chronic combined systolic and diastolic CHF (congestive heart failure) (HCC)    a. EF 35-40% on echo 09/2013 b. echo 04/2016: EF 50-55% with mild to moderate MS.  [name] Chronic diastolic CHF (congestive heart failure) ([place])    EF 35-40% on echo 09/2013 but now 50-55% by echo 04/2016  . Cirrhosis ([place])    a. Newly recognized 09/2013 - by CT.  . CKD (chronic kidney disease)   . Emesis   . Gastritis with hemorrhage   .  GI bleeding   . Gout 11/07/2013  . History of gout 08/15/2015  . Hydronephrosis of left kidney 06/12/2020  . Hypertension   . Iron deficiency anemia due to chronic blood loss 06/21/2020  . Left ureteral stone 06/12/2020  . Liver cirrhosis ([place]) 10/10/2013  . Mitral valve disorder    a. Severe MR s/p repair 2003 (28 mm annuloplasty ring and and oversew of LAA). No CAD by cath at that time.  [name] NSVT (nonsustained ventricular tachycardia) ([place])    a. Isolated event during 09/2007 adm.  . NSVT (nonsustained ventricular  tachycardia) ([place])   . Orthostatic hypotension 10/31/2013  . Orthostatic hypotension dysautonomic syndrome ([place])   . Permanent atrial fibrillation ([place]) 10/28/2013  . Pulmonary hypertension (HCC)    Group II secondary to CHF and MV disease  . Respiratory distress 10/06/2017  . Sepsis secondary to UTI ([place]) 06/12/2020  . Spinal stenosis    shes recieved epidural steroid injections in the past  . Syncope and collapse 04/04/2020  . Tricuspid regurgitation     SURGICAL HISTORY: Past Surgical History:  Procedure Laterality Date  . APPENDECTOMY    . BALLOON DILATION N/A 06/05/2020   Procedure: BALLOON DILATION;  Surgeon: Otis Brace, MD;  Location: [facility];  Service: Gastroenterology;  Laterality: N/A;  . BIOPSY  06/05/2020   Procedure: BIOPSY;  Surgeon: Otis Brace, MD;  Location: [facility];  Service: Gastroenterology;;  . BIOPSY  06/18/2020   Procedure: BIOPSY;  Surgeon: Wilford Corner, MD;  Location: [facility];  Service: Endoscopy;;  . BIV PACEMAKER GENERATOR CHANGE OUT N/A 12/13/2014   Procedure: BIV PACEMAKER GENERATOR CHANGE OUT;  Surgeon: Evans Lance, MD;  Location: [facility] CATH LAB;  Service: Cardiovascular;  Laterality: N/A;  . CHOLECYSTECTOMY    . COLONOSCOPY N/A 06/18/2020   Procedure: COLONOSCOPY;  Surgeon: Wilford Corner, MD;  Location: [facility];  Service: Endoscopy;  Laterality: N/A;  Either to be done by Dr. Michail Sermon or Dr. Paulita Fujita  . CYSTOSCOPY WITH STENT PLACEMENT Left 06/11/2020   Procedure: CYSTOSCOPY WITH STENT PLACEMENT;  Surgeon: Irine Seal, MD;  Location: [facility];  Service: Urology;  Laterality: Left;  . CYSTOSCOPY/URETEROSCOPY/HOLMIUM LASER/STENT PLACEMENT Left 07/10/2020   Procedure: LEFT URETEROSCOPY/HOLMIUM LASER/STENT EXCHANGE;  Surgeon: Irine Seal, MD;  Location: [facility];  Service: Urology;  Laterality: Left;  . ESOPHAGOGASTRODUODENOSCOPY (EGD) WITH PROPOFOL N/A 06/05/2020   Procedure: ESOPHAGOGASTRODUODENOSCOPY (EGD) WITH PROPOFOL;  Surgeon:  Otis Brace, MD;  Location: [facility];  Service: Gastroenterology;  Laterality: N/A;  . IRRIGATION AND DEBRIDEMENT ABSCESS N/A 10/14/2013   Procedure: IRRIGATION AND DEBRIDEMENT PERINEAL ABSCESS;  Surgeon: Rolm Bookbinder, MD;  Location: [facility];  Service: General;  Laterality: N/A;  . LAPAROSCOPIC RIGHT COLECTOMY Right 06/22/2020   Procedure: LAPAROSCOPIC RIGHT COLECTOMY;  Surgeon: Jovita Kussmaul, MD;  Location: [facility];  Service: General;  Laterality: Right;  . SUBMUCOSAL TATTOO INJECTION  06/18/2020   Procedure: SUBMUCOSAL TATTOO INJECTION;  Surgeon: Wilford Corner, MD;  Location: [facility];  Service: Endoscopy;;  . VALVE REPLACEMENT     sever mitral regurgitation s/p mitral valve annuloplasty ring    SOCIAL HISTORY: Social History   Socioeconomic History  . Marital status: Single    Spouse name: Not on file  . Number of children: Not on file  . Years of education: Not on file  . Highest education level: Not on file  Occupational History  . Not on file  Tobacco Use  . Smoking status: Former Smoker    Quit date: 10/11/1971  Years since quitting: 48.8  . Smokeless tobacco: Never Used  Substance and Sexual Activity  . Alcohol use: Yes    Comment: rare  . Drug use: No  . Sexual activity: Not on file  Other Topics Concern  . Not on file  Social History Narrative  . Not on file   Social Determinants of Health   Financial Resource Strain:   . Difficulty of Paying Living Expenses: Not on file  Food Insecurity:   . Worried About Charity fundraiser in the Last Year: Not on file  . Ran Out of Food in the Last Year: Not on file  Transportation Needs:   . Lack of Transportation (Medical): Not on file  . Lack of Transportation (Non-Medical): Not on file  Physical Activity:   . Days of Exercise per Week: Not on file  . Minutes of Exercise per Session: Not on file  Stress:   . Feeling of Stress : Not on file  Social Connections:   . Frequency of Communication with  Friends and Family: Not on file  . Frequency of Social Gatherings with Friends and Family: Not on file  . Attends Religious Services: Not on file  . Active Member of Clubs or Organizations: Not on file  . Attends Archivist Meetings: Not on file  . Marital Status: Not on file  Intimate Partner Violence:   . Fear of Current or Ex-Partner: Not on file  . Emotionally Abused: Not on file  . Physically Abused: Not on file  . Sexually Abused: Not on file    FAMILY HISTORY: Family History  Problem Relation Age of Onset  . Pancreatic cancer Father   . Other Mother        Mother died at 47 with no real medical problems    ALLERGIES:  has No Known Allergies.  MEDICATIONS:  Current Outpatient Medications  Medication Sig Dispense Refill  . ascorbic acid (VITAMIN C) 500 MG tablet Take 500 mg by mouth daily.    . cephALEXin (KEFLEX) 500 MG capsule Take 500 mg by mouth 2 (two) times daily. Started on 07/19/20 for UTI @@ [facility]    . risperiDONE (RISPERDAL) 0.5 MG tablet Take 0.5 mg by mouth 2 (two) times daily. As needed for delusions, psychosis, aggressive behavior    . senna (SENOKOT) 8.6 MG TABS tablet Take 1 tablet by mouth daily.    [name] acetaminophen (TYLENOL) 500 MG tablet Take 2 tablets (1,000 mg total) by mouth every 6 (six) hours as needed for mild pain or moderate pain.    . cetirizine (ZYRTEC) 10 MG tablet Take 10 mg by mouth daily.    . Febuxostat 80 MG TABS Take 0.5 tablets (40 mg total) by mouth daily.    . ferrous sulfate 325 (65 FE) MG tablet Take 1 tablet (325 mg total) by mouth 2 (two) times daily with a meal. 60 tablet 6  . midodrine (PROAMATINE) 5 MG tablet Take 1.5 tablets (7.5 mg total) by mouth 3 (three) times daily with meals.    . Multiple Vitamins-Minerals (OCUVITE ADULT 50+) CAPS Take 1 capsule by mouth daily.    . Nutritional Supplements (,FEEDING SUPPLEMENT, PROSOURCE PLUS) liquid Take 30 mLs by mouth 2 (two) times daily between meals.    . pantoprazole  (PROTONIX) 40 MG tablet Take 40 mg by mouth every morning.    . potassium chloride SA (KLOR-CON) 20 MEQ tablet Take 1 tablet (20 mEq total) by mouth daily. 180 tablet 2  .  sodium bicarbonate 650 MG tablet Take 1 tablet (650 mg total) by mouth 2 (two) times daily.    [name] torsemide (DEMADEX) 20 MG tablet Take 1 tablet (20 mg total) by mouth daily. 90 tablet 3   No current facility-administered medications for this visit.    REVIEW OF SYSTEMS:   Constitutional: ( - ) fevers, ( - )  chills , ( - ) night sweats Eyes: ( - ) blurriness of vision, ( - ) double vision, ( - ) watery eyes Ears, nose, mouth, throat, and face: ( - ) mucositis, ( - ) sore throat Respiratory: ( - ) cough, ( - ) dyspnea, ( - ) wheezes Cardiovascular: ( - ) palpitation, ( - ) chest discomfort, ( - ) lower extremity swelling Gastrointestinal:  ( - ) nausea, ( - ) heartburn, ( - ) change in bowel habits Skin: ( - ) abnormal skin rashes Lymphatics: ( - ) new lymphadenopathy, ( - ) easy bruising Neurological: ( - ) numbness, ( - ) tingling, ( - ) new weaknesses Behavioral/Psych: ( - ) mood change, ( - ) new changes  All other systems were reviewed with the patient and are negative.  PHYSICAL EXAMINATION: ECOG PERFORMANCE STATUS: 3 - Symptomatic, >50% confined to bed  Vitals:   07/25/20 1132  BP: (!) 103/56  Pulse: 74  Resp: 17  Temp: 97.9 F (36.6 C)  SpO2: 100%   Filed Weights   07/25/20 1132  Weight: 203 lb (92.1 kg)    GENERAL: alert, no distress and comfortable SKIN: skin color, texture, turgor are normal, no rashes or significant lesions EYES: conjunctiva are pink and non-injected, sclera clear OROPHARYNX: no exudate, no erythema; lips, buccal mucosa, and tongue normal  NECK: supple, non-tender LYMPH:  no palpable lymphadenopathy in the cervical, axillary or inguinal LUNGS: clear to auscultation and percussion with normal breathing effort HEART: regular rate & rhythm and no murmurs and no lower extremity  edema ABDOMEN: soft, non-tender, non-distended, normal bowel sounds Musculoskeletal: no cyanosis of digits and no clubbing  PSYCH: alert & oriented x 3, fluent speech NEURO: no focal motor/sensory deficits  LABORATORY DATA:  I have reviewed the data as listed CBC Latest Ref Rng & Units 07/25/2020 07/12/2020 07/11/2020  WBC 4.0 - 10.5 K/uL 5.7 5.5 6.9  Hemoglobin 12.0 - 15.0 g/dL 9.3(L) 8.2(L) 7.4(L)  Hematocrit 36 - 46 % 32.7(L) 28.7(L) 25.5(L)  Platelets 150 - 400 K/uL 155 150 142(L)    CMP Latest Ref Rng & Units 07/25/2020 07/14/2020 07/13/2020  Glucose 70 - 99 mg/dL 78 79 84  BUN 8 - 23 mg/dL 74(H) 57(H) 49(H)  Creatinine 0.44 - 1.00 mg/dL 1.76(H) 1.92(H) 2.07(H)  Sodium 135 - 145 mmol/L 149(H) 144 147(H)  Potassium 3.5 - 5.1 mmol/L 4.4 3.4(L) 3.8  Chloride 98 - 111 mmol/L 117(H) 114(H) 116(H)  CO2 22 - 32 mmol/L 23 20(L) 21(L)  Calcium 8.9 - 10.3 mg/dL 7.8(L) 7.4(L) 7.6(L)  Total Protein 6.5 - 8.1 g/dL 5.1(L) - -  Total Bilirubin 0.3 - 1.2 mg/dL 0.5 - -  Alkaline Phos 38 - 126 U/L 128(H) - -  AST 15 - 41 U/L 18 - -  ALT 0 - 44 U/L 13 - -    RADIOGRAPHIC STUDIES: CT ABDOMEN PELVIS WO CONTRAST  Result Date: 07/02/2020 CLINICAL DATA:  Abdominal pain. EXAM: CT ABDOMEN AND PELVIS WITHOUT CONTRAST TECHNIQUE: Multidetector CT imaging of the abdomen and pelvis was performed following the standard protocol without IV contrast. COMPARISON:  June 15, 2020 FINDINGS: Lower chest: Mild atelectasis and/or infiltrate is seen within the bilateral lung bases and posterior aspect of the left upper lobe. Multiple sternal wires are noted. Hepatobiliary: No focal liver abnormality is seen. Status post cholecystectomy. No biliary dilatation. Pancreas: Unremarkable. No pancreatic ductal dilatation or surrounding inflammatory changes. Spleen: A 3.6 cm diameter cyst is seen within the posteromedial aspect of the spleen. Adrenals/Urinary Tract: Adrenal glands are unremarkable. Kidneys are normal in size. 7 mm and  4 mm round hyperdense foci are seen within the anterior aspect of the mid right kidney. A 2 mm nonobstructing renal stone is noted within the upper pole of the right kidney. A 4 mm nonobstructing renal stone is seen within the anterior aspect of the lower pole of the right kidney. A left-sided endo ureteral stent is noted. A 2 mm nonobstructing renal stone is seen within the mid left kidney. Bladder is unremarkable. Stomach/Bowel: Stomach is within normal limits. The appendix is not clearly identified. Surgical sutures are seen along the expected region of the distal ascending colon. No evidence of bowel dilatation. Noninflamed diverticula are seen within the large bowel. Vascular/Lymphatic: There is marked severity calcification of the abdominal aorta and bilateral common iliac arteries, without evidence of aneurysmal dilatation. 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 No enlarged abdominal or pelvic lymph nodes. Reproductive: Uterus and bilateral adnexa are unremarkable. Other: A surgical defect is seen along the midline of the anterior abdominal wall. No abdominopelvic ascites. Musculoskeletal: Moderate severity degenerative changes seen throughout the lumbar spine with approximately 3 mm retrolisthesis of the L4 vertebral body on L5 and 2 mm retrolisthesis of the L5 vertebral body on S1. IMPRESSION: 1. Mild atelectasis and/or infiltrate within the bilateral lung bases and posterior aspect of the left upper lobe. 2. Evidence of prior cholecystectomy. 3. Bilateral nonobstructing renal calculi. 4. Colonic diverticulosis. Aortic Atherosclerosis (ICD10-I70.0). Electronically Signed   By: Virgina Norfolk M.D.   On: 07/02/2020 15:08   DG Abd 1 View  Result Date: 06/27/2020 CLINICAL DATA:  Nausea and vomiting EXAM: ABDOMEN - 1 VIEW COMPARISON:  June 27, 2020 FINDINGS: Double-J stent is again noted on the left, unchanged in position. There remain loops of dilated bowel, similar to 1 day prior.  No free air evident. Left lower lobe consolidation noted. IMPRESSION: Double-J stent on the left. Loops of dilated bowel remain. Suspect postoperative ileus, although a degree of bowel obstruction question. Opacity left lower lobe noted. Electronically Signed   By: Lowella Grip III M.D.   On: 06/27/2020 07:56   DG Abd 1 View  Result Date: 06/26/2020 CLINICAL DATA:  Abdominal pain EXAM: ABDOMEN - 1 VIEW COMPARISON:  June 25, 2020. FINDINGS: Double-J stent on the left is unchanged in position. Loops of dilated bowel are similar to 1 day prior. Surgical clips are noted in the right upper quadrant. There is calcification in the splenic artery. No appreciable free air on supine examination. No skin staples to the left of midline. IMPRESSION: Loops of dilated bowel, similar to 1 day prior. Suspect postoperative ileus. A degree of bowel obstruction cannot be excluded. Double-J stent unchanged in position on the left. Clips right upper quadrant noted. Electronically Signed   By: Lowella Grip III M.D.   On: 06/26/2020 09:10   DG CHEST PORT 1 VIEW  Result Date: 07/10/2020 CLINICAL DATA:  Shortness of breath.  Postoperative exam. EXAM: PORTABLE CHEST 1 VIEW COMPARISON:  07/05/2020 FINDINGS: Stable right IJ central venous catheter. Median sternotomy and  cardiac valve replacement. Left-sided AICD. Stable cardiomegaly. Atherosclerotic calcification of the aortic knob. Persistent left-sided pleural effusion. Hazy bibasilar opacities, similar to prior. No pneumothorax. IMPRESSION: Persistent left-sided pleural effusion with hazy bibasilar opacities, similar to prior. Electronically Signed   By: Davina Poke D.O.   On: 07/10/2020 13:37   DG CHEST PORT 1 VIEW  Result Date: 07/05/2020 CLINICAL DATA:  Wheezing. EXAM: PORTABLE CHEST 1 VIEW COMPARISON:  06/22/2020. FINDINGS: Right IJ line in stable position. AICD in stable position. Prior cardiac valve replacement. Cardiomegaly. No pulmonary venous congestion.  Persistent unchanged left pleural effusion. Underlying atelectasis and or infiltrate cannot be excluded. Mild right infrahilar infiltrate cannot be excluded. No pneumothorax. IMPRESSION: 1.  Right IJ line in stable position. 2. AICD in stable position. Cardiomegaly. No pulmonary venous congestion. 3. Persistent unchanged left pleural effusion. Underlying atelectasis and or infiltrate cannot be excluded. Mild right infrahilar infiltrate cannot be excluded. Electronically Signed   By: Marcello Moores  Register   On: 07/05/2020 13:06   DG Abd Portable 1V  Result Date: 07/01/2020 CLINICAL DATA:  Post right colectomy 9 days ago.  Emesis. EXAM: PORTABLE ABDOMEN - 1 VIEW COMPARISON:  06/28/2020 FINDINGS: Surgical clips over the right upper quadrant. Skin staples are present vertically over the left mid to lower abdomen. Double-J left-sided internal ureteral stent is unchanged. Bowel gas pattern demonstrates air throughout the colon. No free peritoneal air. Resolution of previously seen mildly dilated small bowel loops. Remainder of the exam is unchanged. IMPRESSION: 1. Nonobstructive bowel gas pattern. 2. Left-sided double-J internal ureteral stent unchanged. Electronically Signed   By: Marin Olp M.D.   On: 07/01/2020 08:57   DG Abd Portable 1V  Result Date: 06/28/2020 CLINICAL DATA:  Ileus. EXAM: PORTABLE ABDOMEN - 1 VIEW COMPARISON:  06/27/2020 FINDINGS: Persistent air-filled mildly dilated small bowel loop in the left abdomen measuring 4 cm in diameter. Air is present throughout the colon. No free peritoneal air. Moderate gaseous distention of the stomach. Double-J left-sided internal ureteral stent unchanged. Remainder of the exam is unchanged. IMPRESSION: Persistent minimally dilated small bowel loops with air throughout the colon likely due to ileus. Electronically Signed   By: Marin Olp M.D.   On: 06/28/2020 07:58   DG C-Arm 1-60 Min-No Report  Result Date: 07/10/2020 Fluoroscopy was utilized by the  requesting physician.  No radiographic interpretation.   ECHOCARDIOGRAM LIMITED  Result Date: 07/09/2020    ECHOCARDIOGRAM LIMITED REPORT   Patient Name:   LANIJAH WARZECHA Date of Exam: 07/09/2020 Medical Rec #:  [id number]      Height:       62.0 in Accession #:    [id number]     Weight:       208.1 lb Date of Birth:  [birth year]     BSA:          1.944 m Patient Age:    86 years       BP:           90/46 mmHg Patient Gender: F              HR:           70 bpm. Exam Location:  Inpatient Procedure: Limited Echo, Color Doppler, Cardiac Doppler and Intracardiac            Opacification Agent Indications:    J09.32 Acute systolic (congestive) heart failure  History:        Patient has prior history of Echocardiogram examinations, most  recent 04/05/2020. CHF, Pacemaker, Arrythmias:Atrial                 Fibrillation; Risk Factors:Hypertension. 2003 57mm Mitral                 Annuloplasty Ring implanted.                  Mitral Valve: 28 mm prosthetic annuloplasty ring valve is                 present in the mitral position.  Sonographer:    Raquel Sarna Senior RDCS Referring Phys: [id number] Donato Heinz  Sonographer Comments: Technically challenging study due to limited acoustic windows. Limited to assess systolic function, valves, and IVC. IMPRESSIONS  1. Left ventricular ejection fraction, by estimation, is 40 to 45%. The left ventricle has mildly decreased function. The left ventricle demonstrates global hypokinesis. Left ventricular diastolic function could not be evaluated.  2. Right ventricular systolic function is mildly reduced. The right ventricular size is mildly enlarged. There is moderately elevated pulmonary artery systolic pressure. The estimated right ventricular systolic pressure is 93.2 mmHg.  3. Left atrial size was severely dilated.  4. Right atrial size was severely dilated.  5. The mitral valve has been repaired/replaced. Trivial mitral valve regurgitation. The mean mitral valve  gradient is 4.0 mmHg with average heart rate of 70 bpm. There is a 28 mm prosthetic annuloplasty ring present in the mitral position.  6. The pericardial effusion is posterior to the left ventricle. Moderate pleural effusion in the left lateral region.  7. The tricuspid valve is abnormal. Tricuspid valve regurgitation is moderate to severe.  8. The aortic valve is tricuspid. Mild aortic valve sclerosis is present, with no evidence of aortic valve stenosis.  9. The inferior vena cava is dilated in size with <50% respiratory variability, suggesting right atrial pressure of 15 mmHg. Comparison(s): Changes from prior study are noted. 04/05/20: LVEF 40%, severe TR. FINDINGS  Left Ventricle: Left ventricular ejection fraction, by estimation, is 40 to 45%. The left ventricle has mildly decreased function. The left ventricle demonstrates global hypokinesis. Definity contrast agent was given IV to delineate the left ventricular  endocardial borders. The left ventricular internal cavity size was normal in size. There is borderline left ventricular hypertrophy. Abnormal (paradoxical) septal motion, consistent with RV pacemaker. Left ventricular diastolic function could not be evaluated due to paced rhythm. Right Ventricle: The right ventricular size is mildly enlarged. No increase in right ventricular wall thickness. Right ventricular systolic function is mildly reduced. There is moderately elevated pulmonary artery systolic pressure. The tricuspid regurgitant velocity is 2.76 m/s, and with an assumed right atrial pressure of 15 mmHg, the estimated right ventricular systolic pressure is 67.1 mmHg. Left Atrium: Left atrial size was severely dilated. Right Atrium: Right atrial size was severely dilated. Pericardium: A small pericardial effusion is present. The pericardial effusion is posterior to the left ventricle. Mitral Valve: The mitral valve has been repaired/replaced. Trivial mitral valve regurgitation. There is a 28 mm  prosthetic annuloplasty ring present in the mitral position. MV peak gradient, 10.8 mmHg. The mean mitral valve gradient is 4.0 mmHg with average heart rate of 70 bpm. Tricuspid Valve: The tricuspid valve is abnormal. Tricuspid valve regurgitation is moderate to severe. Aortic Valve: The aortic valve is tricuspid. Mild aortic valve sclerosis is present, with no evidence of aortic valve stenosis. Pulmonic Valve: The pulmonic valve was grossly normal. Pulmonic valve regurgitation is trivial. Venous: The inferior vena cava  is dilated in size with less than 50% respiratory variability, suggesting right atrial pressure of 15 mmHg. IAS/Shunts: No atrial level shunt detected by color flow Doppler. Additional Comments: A pacer wire is visualized. There is a moderate pleural effusion in the left lateral region. LEFT VENTRICLE PLAX 2D LVIDd:         4.90 cm LVIDs:         3.90 cm LV PW:         0.90 cm LV IVS:        1.00 cm LVOT diam:     1.80 cm LV SV:         36 LV SV Index:   18 LVOT Area:     2.54 cm  RIGHT VENTRICLE RV S prime:     7.83 cm/s TAPSE (M-mode): 1.6 cm LEFT ATRIUM         Index LA diam:    4.70 cm 2.42 cm/m  AORTIC VALVE LVOT Vmax:   69.80 cm/s LVOT Vmean:  54.100 cm/s LVOT VTI:    0.141 m MITRAL VALVE             TRICUSPID VALVE MV Area (PHT): 1.59 cm  TR Peak grad:   30.5 mmHg MV Peak grad:  10.8 mmHg TR Vmax:        276.00 cm/s MV Mean grad:  4.0 mmHg MV Vmax:       1.64 m/s  SHUNTS MV Vmean:      88.3 cm/s Systemic VTI:  0.14 m                          Systemic Diam: 1.80 cm Lyman Bishop MD Electronically signed by Lyman Bishop MD Signature Date/Time: 07/09/2020/10:04:57 AM    Final     ASSESSMENT & PLAN [name] 86 y.o. female with medical history significant for Stage II colon cancer s/p resection who presents for a follow up visit.  To review the labs, review the records, discussion with the patient the findings are most consistent with a stage II colon cancer status post resection.  She  does not appear to have any high risk features based on the pathological review.  As such I would recommend observation with appropriate NCCN recommended levels of surveillance.  We will continue to see the patient every 3 months with CEA levels.  We will order a CT scan in approximately 6 months time and if stable would recommend every 12 month scans moving forward from there.  The patient and her daughter voiced understanding of this plan moving forward.  In the interim we recommend the patient continue to work with physical therapy at her rehab facility in order to improve her strength following the surgery.  # Adenocarcinoma of the Colon ([id number]) Stage II s/p resection --patient has no high risk features and is a straightforward Stage II colon cancer. As such I think she is appropriate for observation --recommend H/P q 3-6 months with CEA checks. Additionally recommend CT C/A/P q 6-12 months for a total of 5 years --assure patient has a colonoscopy 1 year out from surgery (August 2022) --f/u scheduled with surgery later this week to look at wound vac.  --plan to have patient RTC in 3 months time for continued observation.    No orders of the defined types were placed in this encounter.   All questions were answered. The patient knows to call the clinic with any problems, questions or concerns.  A total of more than 30 minutes were spent on this encounter and over half of that time was spent on counseling and coordination of care as outlined above.   Ledell Peoples, MD Department of Hematology/Oncology [place] [facility] Phone: [phone] Pager: [phone] Email: [email or web address]  07/25/2020 1:21 PM

## 2020-07-27 ENCOUNTER — Other Ambulatory Visit: Payer: Self-pay

## 2020-07-27 ENCOUNTER — Emergency Department (HOSPITAL_COMMUNITY): Payer: Medicare Other

## 2020-07-27 ENCOUNTER — Inpatient Hospital Stay (HOSPITAL_COMMUNITY)
Admission: EM | Admit: 2020-07-27 | Discharge: 2020-08-17 | DRG: 291 | Disposition: E | Payer: Medicare Other | Source: Skilled Nursing Facility | Attending: Internal Medicine | Admitting: Internal Medicine

## 2020-07-27 ENCOUNTER — Telehealth: Payer: Self-pay | Admitting: Hematology and Oncology

## 2020-07-27 DIAGNOSIS — Z20822 Contact with and (suspected) exposure to covid-19: Secondary | ICD-10-CM | POA: Diagnosis present

## 2020-07-27 DIAGNOSIS — I313 Pericardial effusion (noninflammatory): Secondary | ICD-10-CM | POA: Diagnosis present

## 2020-07-27 DIAGNOSIS — Z66 Do not resuscitate: Secondary | ICD-10-CM | POA: Diagnosis present

## 2020-07-27 DIAGNOSIS — J810 Acute pulmonary edema: Secondary | ICD-10-CM | POA: Diagnosis not present

## 2020-07-27 DIAGNOSIS — T68XXXA Hypothermia, initial encounter: Secondary | ICD-10-CM | POA: Diagnosis not present

## 2020-07-27 DIAGNOSIS — I130 Hypertensive heart and chronic kidney disease with heart failure and stage 1 through stage 4 chronic kidney disease, or unspecified chronic kidney disease: Principal | ICD-10-CM | POA: Diagnosis present

## 2020-07-27 DIAGNOSIS — G9341 Metabolic encephalopathy: Secondary | ICD-10-CM | POA: Diagnosis present

## 2020-07-27 DIAGNOSIS — Z7189 Other specified counseling: Secondary | ICD-10-CM | POA: Diagnosis not present

## 2020-07-27 DIAGNOSIS — N184 Chronic kidney disease, stage 4 (severe): Secondary | ICD-10-CM | POA: Diagnosis present

## 2020-07-27 DIAGNOSIS — I059 Rheumatic mitral valve disease, unspecified: Secondary | ICD-10-CM | POA: Diagnosis present

## 2020-07-27 DIAGNOSIS — E870 Hyperosmolality and hypernatremia: Secondary | ICD-10-CM | POA: Diagnosis present

## 2020-07-27 DIAGNOSIS — I4821 Permanent atrial fibrillation: Secondary | ICD-10-CM | POA: Diagnosis present

## 2020-07-27 DIAGNOSIS — I2722 Pulmonary hypertension due to left heart disease: Secondary | ICD-10-CM | POA: Diagnosis present

## 2020-07-27 DIAGNOSIS — D696 Thrombocytopenia, unspecified: Secondary | ICD-10-CM | POA: Diagnosis present

## 2020-07-27 DIAGNOSIS — Z515 Encounter for palliative care: Secondary | ICD-10-CM | POA: Diagnosis not present

## 2020-07-27 DIAGNOSIS — M7989 Other specified soft tissue disorders: Secondary | ICD-10-CM | POA: Diagnosis not present

## 2020-07-27 DIAGNOSIS — R601 Generalized edema: Secondary | ICD-10-CM | POA: Diagnosis present

## 2020-07-27 DIAGNOSIS — Z9049 Acquired absence of other specified parts of digestive tract: Secondary | ICD-10-CM | POA: Diagnosis not present

## 2020-07-27 DIAGNOSIS — I5023 Acute on chronic systolic (congestive) heart failure: Secondary | ICD-10-CM

## 2020-07-27 DIAGNOSIS — Z950 Presence of cardiac pacemaker: Secondary | ICD-10-CM | POA: Diagnosis not present

## 2020-07-27 DIAGNOSIS — Z789 Other specified health status: Secondary | ICD-10-CM

## 2020-07-27 DIAGNOSIS — E8809 Other disorders of plasma-protein metabolism, not elsewhere classified: Secondary | ICD-10-CM | POA: Diagnosis present

## 2020-07-27 DIAGNOSIS — R0902 Hypoxemia: Secondary | ICD-10-CM | POA: Diagnosis not present

## 2020-07-27 DIAGNOSIS — I951 Orthostatic hypotension: Secondary | ICD-10-CM | POA: Diagnosis present

## 2020-07-27 DIAGNOSIS — N179 Acute kidney failure, unspecified: Secondary | ICD-10-CM | POA: Diagnosis present

## 2020-07-27 DIAGNOSIS — Z87440 Personal history of urinary (tract) infections: Secondary | ICD-10-CM

## 2020-07-27 DIAGNOSIS — I509 Heart failure, unspecified: Secondary | ICD-10-CM | POA: Diagnosis not present

## 2020-07-27 DIAGNOSIS — Z87442 Personal history of urinary calculi: Secondary | ICD-10-CM

## 2020-07-27 DIAGNOSIS — I495 Sick sinus syndrome: Secondary | ICD-10-CM | POA: Diagnosis present

## 2020-07-27 DIAGNOSIS — J9601 Acute respiratory failure with hypoxia: Secondary | ICD-10-CM

## 2020-07-27 DIAGNOSIS — K7460 Unspecified cirrhosis of liver: Secondary | ICD-10-CM | POA: Diagnosis present

## 2020-07-27 DIAGNOSIS — I5043 Acute on chronic combined systolic (congestive) and diastolic (congestive) heart failure: Secondary | ICD-10-CM

## 2020-07-27 DIAGNOSIS — R531 Weakness: Secondary | ICD-10-CM

## 2020-07-27 DIAGNOSIS — R410 Disorientation, unspecified: Secondary | ICD-10-CM | POA: Diagnosis not present

## 2020-07-27 DIAGNOSIS — D649 Anemia, unspecified: Secondary | ICD-10-CM | POA: Diagnosis present

## 2020-07-27 DIAGNOSIS — I429 Cardiomyopathy, unspecified: Secondary | ICD-10-CM | POA: Diagnosis present

## 2020-07-27 DIAGNOSIS — D509 Iron deficiency anemia, unspecified: Secondary | ICD-10-CM | POA: Diagnosis present

## 2020-07-27 DIAGNOSIS — I5082 Biventricular heart failure: Secondary | ICD-10-CM | POA: Diagnosis present

## 2020-07-27 DIAGNOSIS — I083 Combined rheumatic disorders of mitral, aortic and tricuspid valves: Secondary | ICD-10-CM | POA: Diagnosis present

## 2020-07-27 DIAGNOSIS — Z9889 Other specified postprocedural states: Secondary | ICD-10-CM

## 2020-07-27 DIAGNOSIS — Z85038 Personal history of other malignant neoplasm of large intestine: Secondary | ICD-10-CM | POA: Diagnosis not present

## 2020-07-27 DIAGNOSIS — R680 Hypothermia, not associated with low environmental temperature: Secondary | ICD-10-CM | POA: Diagnosis present

## 2020-07-27 DIAGNOSIS — R4182 Altered mental status, unspecified: Secondary | ICD-10-CM

## 2020-07-27 DIAGNOSIS — I2510 Atherosclerotic heart disease of native coronary artery without angina pectoris: Secondary | ICD-10-CM | POA: Diagnosis present

## 2020-07-27 DIAGNOSIS — M109 Gout, unspecified: Secondary | ICD-10-CM | POA: Diagnosis present

## 2020-07-27 DIAGNOSIS — Z87891 Personal history of nicotine dependence: Secondary | ICD-10-CM

## 2020-07-27 DIAGNOSIS — Z79899 Other long term (current) drug therapy: Secondary | ICD-10-CM

## 2020-07-27 DIAGNOSIS — R0602 Shortness of breath: Secondary | ICD-10-CM

## 2020-07-27 DIAGNOSIS — Z800 Family history of malignant neoplasm of digestive organs: Secondary | ICD-10-CM

## 2020-07-27 LAB — I-STAT VENOUS BLOOD GAS, ED
Acid-Base Excess: 0 mmol/L (ref 0.0–2.0)
Acid-base deficit: 1 mmol/L (ref 0.0–2.0)
Bicarbonate: 27.8 mmol/L (ref 20.0–28.0)
Bicarbonate: 25.8 mmol/L (ref 20.0–28.0)
Calcium, Ion: 1.1 mmol/L — ABNORMAL LOW (ref 1.15–1.40)
Calcium, Ion: 1.09 mmol/L — ABNORMAL LOW (ref 1.15–1.40)
HCT: 30 % — ABNORMAL LOW (ref 36.0–46.0)
HCT: 28 % — ABNORMAL LOW (ref 36.0–46.0)
Hemoglobin: 10.2 g/dL — ABNORMAL LOW (ref 12.0–15.0)
Hemoglobin: 9.5 g/dL — ABNORMAL LOW (ref 12.0–15.0)
O2 Saturation: 84 %
O2 Saturation: 85 %
Potassium: 4.1 mmol/L (ref 3.5–5.1)
Potassium: 3.8 mmol/L (ref 3.5–5.1)
Sodium: 149 mmol/L — ABNORMAL HIGH (ref 135–145)
Sodium: 150 mmol/L — ABNORMAL HIGH (ref 135–145)
TCO2: 30 mmol/L (ref 22–32)
TCO2: 27 mmol/L (ref 22–32)
pCO2, Ven: 58.8 mmHg (ref 44.0–60.0)
pCO2, Ven: 50.8 mmHg (ref 44.0–60.0)
pH, Ven: 7.282 (ref 7.250–7.430)
pH, Ven: 7.314 (ref 7.250–7.430)
pO2, Ven: 55 mmHg — ABNORMAL HIGH (ref 32.0–45.0)
pO2, Ven: 55 mmHg — ABNORMAL HIGH (ref 32.0–45.0)

## 2020-07-27 LAB — LIPASE, BLOOD: Lipase: 40 U/L (ref 11–51)

## 2020-07-27 LAB — CBC WITH DIFFERENTIAL/PLATELET
Abs Immature Granulocytes: 0.02 10*3/uL (ref 0.00–0.07)
Basophils Absolute: 0 10*3/uL (ref 0.0–0.1)
Basophils Relative: 0 %
Eosinophils Absolute: 0.2 10*3/uL (ref 0.0–0.5)
Eosinophils Relative: 2 %
HCT: 33.6 % — ABNORMAL LOW (ref 36.0–46.0)
Hemoglobin: 9.3 g/dL — ABNORMAL LOW (ref 12.0–15.0)
Immature Granulocytes: 0 %
Lymphocytes Relative: 48 %
Lymphs Abs: 3.1 10*3/uL (ref 0.7–4.0)
MCH: 26.7 pg (ref 26.0–34.0)
MCHC: 27.7 g/dL — ABNORMAL LOW (ref 30.0–36.0)
MCV: 96.6 fL (ref 80.0–100.0)
Monocytes Absolute: 0.5 10*3/uL (ref 0.1–1.0)
Monocytes Relative: 8 %
Neutro Abs: 2.7 10*3/uL (ref 1.7–7.7)
Neutrophils Relative %: 42 %
Platelets: 141 10*3/uL — ABNORMAL LOW (ref 150–400)
RBC: 3.48 MIL/uL — ABNORMAL LOW (ref 3.87–5.11)
RDW: 22.8 % — ABNORMAL HIGH (ref 11.5–15.5)
WBC: 6.4 10*3/uL (ref 4.0–10.5)
nRBC: 0 % (ref 0.0–0.2)

## 2020-07-27 LAB — URINALYSIS, ROUTINE W REFLEX MICROSCOPIC
Bilirubin Urine: NEGATIVE
Glucose, UA: NEGATIVE mg/dL
Hgb urine dipstick: NEGATIVE
Ketones, ur: NEGATIVE mg/dL
Nitrite: NEGATIVE
Protein, ur: NEGATIVE mg/dL
Specific Gravity, Urine: 1.009 (ref 1.005–1.030)
pH: 5 (ref 5.0–8.0)

## 2020-07-27 LAB — COMPREHENSIVE METABOLIC PANEL
ALT: 16 U/L (ref 0–44)
AST: 25 U/L (ref 15–41)
Albumin: 2.2 g/dL — ABNORMAL LOW (ref 3.5–5.0)
Alkaline Phosphatase: 124 U/L (ref 38–126)
Anion gap: 9 (ref 5–15)
BUN: 67 mg/dL — ABNORMAL HIGH (ref 8–23)
CO2: 26 mmol/L (ref 22–32)
Calcium: 7.8 mg/dL — ABNORMAL LOW (ref 8.9–10.3)
Chloride: 114 mmol/L — ABNORMAL HIGH (ref 98–111)
Creatinine, Ser: 1.96 mg/dL — ABNORMAL HIGH (ref 0.44–1.00)
GFR calc Af Amer: 25 mL/min — ABNORMAL LOW (ref >60)
GFR calc non Af Amer: 22 mL/min — ABNORMAL LOW (ref >60)
Glucose, Bld: 84 mg/dL (ref 70–99)
Potassium: 3.9 mmol/L (ref 3.5–5.1)
Sodium: 149 mmol/L — ABNORMAL HIGH (ref 135–145)
Total Bilirubin: 0.8 mg/dL (ref 0.3–1.2)
Total Protein: 4.9 g/dL — ABNORMAL LOW (ref 6.5–8.1)

## 2020-07-27 LAB — I-STAT CHEM 8, ED
BUN: 64 mg/dL — ABNORMAL HIGH (ref 8–23)
Calcium, Ion: 1.13 mmol/L — ABNORMAL LOW (ref 1.15–1.40)
Chloride: 113 mmol/L — ABNORMAL HIGH (ref 98–111)
Creatinine, Ser: 2 mg/dL — ABNORMAL HIGH (ref 0.44–1.00)
Glucose, Bld: 76 mg/dL (ref 70–99)
HCT: 29 % — ABNORMAL LOW (ref 36.0–46.0)
Hemoglobin: 9.9 g/dL — ABNORMAL LOW (ref 12.0–15.0)
Potassium: 3.8 mmol/L (ref 3.5–5.1)
Sodium: 149 mmol/L — ABNORMAL HIGH (ref 135–145)
TCO2: 26 mmol/L (ref 22–32)

## 2020-07-27 LAB — PROTIME-INR
INR: 1.2 (ref 0.8–1.2)
Prothrombin Time: 14.3 seconds (ref 11.4–15.2)

## 2020-07-27 LAB — APTT: aPTT: 45 seconds — ABNORMAL HIGH (ref 24–36)

## 2020-07-27 LAB — LACTIC ACID, PLASMA
Lactic Acid, Venous: 1.1 mmol/L (ref 0.5–1.9)
Lactic Acid, Venous: 0.8 mmol/L (ref 0.5–1.9)

## 2020-07-27 LAB — SARS CORONAVIRUS 2 BY RT PCR (HOSPITAL ORDER, PERFORMED IN ~~LOC~~ HOSPITAL LAB): SARS Coronavirus 2: NEGATIVE

## 2020-07-27 LAB — BRAIN NATRIURETIC PEPTIDE: B Natriuretic Peptide: 298.4 pg/mL — ABNORMAL HIGH (ref 0.0–100.0)

## 2020-07-27 LAB — TROPONIN I (HIGH SENSITIVITY)
Troponin I (High Sensitivity): 21 ng/L — ABNORMAL HIGH (ref <18)
Troponin I (High Sensitivity): 20 ng/L — ABNORMAL HIGH (ref <18)

## 2020-07-27 LAB — CBG MONITORING, ED: Glucose-Capillary: 76 mg/dL (ref 70–99)

## 2020-07-27 MED ORDER — ACETAMINOPHEN 325 MG PO TABS: 650.0000 mg | ORAL_TABLET | ORAL | Status: DC | PRN

## 2020-07-27 MED ORDER — ASCORBIC ACID 500 MG PO TABS: 500.0000 mg | ORAL_TABLET | Freq: Every day | ORAL | Status: DC

## 2020-07-27 MED ORDER — ONDANSETRON HCL 4 MG/2ML IJ SOLN: 4.0000 mg | Freq: Four times a day (QID) | INTRAMUSCULAR | Status: DC | PRN

## 2020-07-27 MED ORDER — SODIUM CHLORIDE 0.9 % IV SOLN
1.0000 g | INTRAVENOUS | Status: DC
  Administered 2020-07-27: 1 g via INTRAVENOUS
  Filled 2020-07-27: qty 10

## 2020-07-27 MED ORDER — PANTOPRAZOLE SODIUM 40 MG PO TBEC: 40.0000 mg | DELAYED_RELEASE_TABLET | Freq: Every morning | ORAL | Status: DC

## 2020-07-27 MED ORDER — FUROSEMIDE 10 MG/ML IJ SOLN
80.0000 mg | Freq: Once | INTRAMUSCULAR | Status: AC
  Administered 2020-07-27: 80 mg via INTRAVENOUS
  Filled 2020-07-27: qty 8

## 2020-07-27 MED ORDER — PROSIGHT PO TABS
1.0000 | ORAL_TABLET | Freq: Every day | ORAL | Status: DC
  Filled 2020-07-27 (×2): qty 1

## 2020-07-27 MED ORDER — JUVEN PO PACK
1.0000 | PACK | Freq: Every day | ORAL | Status: DC
  Filled 2020-07-27 (×2): qty 1

## 2020-07-27 MED ORDER — MIDODRINE HCL 5 MG PO TABS: 5.0000 mg | ORAL_TABLET | Freq: Three times a day (TID) | ORAL | Status: DC

## 2020-07-27 MED ORDER — ALBUMIN HUMAN 25 % IV SOLN
25.0000 g | Freq: Four times a day (QID) | INTRAVENOUS | Status: DC
  Administered 2020-07-27 – 2020-07-28 (×4): 25 g via INTRAVENOUS
  Filled 2020-07-27 (×5): qty 100

## 2020-07-27 MED ORDER — MIDODRINE HCL 5 MG PO TABS: 7.5000 mg | ORAL_TABLET | Freq: Three times a day (TID) | ORAL | Status: DC

## 2020-07-27 MED ORDER — FERROUS SULFATE 325 (65 FE) MG PO TABS: 325.0000 mg | ORAL_TABLET | Freq: Two times a day (BID) | ORAL | Status: DC

## 2020-07-27 MED ORDER — SODIUM CHLORIDE 0.9% FLUSH: 3.0000 mL | INTRAVENOUS | Status: DC | PRN

## 2020-07-27 MED ORDER — FUROSEMIDE 10 MG/ML IJ SOLN
80.0000 mg | Freq: Two times a day (BID) | INTRAMUSCULAR | Status: DC
  Administered 2020-07-27: 80 mg via INTRAVENOUS
  Filled 2020-07-27 (×2): qty 8

## 2020-07-27 MED ORDER — VANCOMYCIN HCL 1500 MG/300ML IV SOLN
1500.0000 mg | INTRAVENOUS | Status: AC
  Administered 2020-07-27: 1500 mg via INTRAVENOUS
  Filled 2020-07-27: qty 300

## 2020-07-27 MED ORDER — SODIUM BICARBONATE 650 MG PO TABS
650.0000 mg | ORAL_TABLET | Freq: Two times a day (BID) | ORAL | Status: DC
  Filled 2020-07-27 (×2): qty 1

## 2020-07-27 MED ORDER — LORATADINE 10 MG PO TABS: 10.0000 mg | ORAL_TABLET | Freq: Every day | ORAL | Status: DC

## 2020-07-27 MED ORDER — ACETAMINOPHEN 500 MG PO TABS: 1000.0000 mg | ORAL_TABLET | Freq: Four times a day (QID) | ORAL | Status: DC | PRN

## 2020-07-27 MED ORDER — SODIUM CHLORIDE 0.9 % IV SOLN: 250.0000 mL | INTRAVENOUS | Status: DC | PRN

## 2020-07-27 MED ORDER — SODIUM CHLORIDE 0.9% FLUSH
3.0000 mL | Freq: Two times a day (BID) | INTRAVENOUS | Status: DC
  Administered 2020-07-27 – 2020-07-28 (×3): 3 mL via INTRAVENOUS

## 2020-07-27 MED ORDER — FEBUXOSTAT 40 MG PO TABS
80.0000 mg | ORAL_TABLET | Freq: Every day | ORAL | Status: DC
  Filled 2020-07-27 (×2): qty 2

## 2020-07-27 MED ORDER — SENNA 8.6 MG PO TABS: 1.0000 | ORAL_TABLET | Freq: Every day | ORAL | Status: DC

## 2020-07-27 MED ORDER — RISPERIDONE 0.5 MG PO TABS
0.5000 mg | ORAL_TABLET | Freq: Two times a day (BID) | ORAL | Status: DC | PRN
  Filled 2020-07-27: qty 1

## 2020-07-27 MED ORDER — PROSOURCE PLUS PO LIQD
30.0000 mL | Freq: Two times a day (BID) | ORAL | Status: DC
  Filled 2020-07-27 (×3): qty 30

## 2020-07-27 MED ORDER — HEPARIN SODIUM (PORCINE) 5000 UNIT/ML IJ SOLN
5000.0000 [IU] | Freq: Two times a day (BID) | INTRAMUSCULAR | Status: DC
  Administered 2020-07-27 (×2): 5000 [IU] via SUBCUTANEOUS
  Filled 2020-07-27 (×2): qty 1

## 2020-07-27 MED ORDER — OCUVITE-LUTEIN PO CAPS
ORAL_CAPSULE | Freq: Every day | ORAL | Status: DC
  Filled 2020-07-27: qty 1

## 2020-07-27 MED ORDER — PIPERACILLIN-TAZOBACTAM 3.375 G IVPB 30 MIN
3.3750 g | Freq: Once | INTRAVENOUS | Status: AC
  Administered 2020-07-27: 3.375 g via INTRAVENOUS
  Filled 2020-07-27: qty 50

## 2020-07-27 NOTE — Consult Note (Signed)
Consultation Note Date: 08/15/2020   Patient Name: [name]  DOB: 1926-10-03  MRN: [id number]  Age / Sex: 86 y.o., female  PCP: [name], MD Referring Physician: Lequita Halt, MD  Reason for Consultation: Establishing goals of care  HPI/Patient Profile: 86 y.o. female  with past medical history of liver cirrhosis, CKD stage III, CHF, atrial fibrillation, pulmonary hypertension, orthostatic hypotension, tachybradycardia syndrome status post biventricular PPM 2007, sever MR status post repair, and recent diagnosis of stage II colon cancer 8/2/21status post right hemicolectomy presented to the ED on 08/03/2020 with shortness of breath and respiratory distress en route to hospital.   Final ED Clinical Impression: AMS, hypoxia, acute pulmnary edema, leg swelling, acute on chronic heart failure, hypothermia. She was admitted to hospitalist service 08/12/2020.  Admitting Diagnosis: Acute hypoxic respiratory failure, sepsis, hypernatremia.  Of note, patient was recently admitted from 7/26-8/28/21 for sepsis secondary to UTI. During that admission patient was diagnosed with stage II colon adenocarcinoma.  Patient and family face treatment option decisions, advanced directive decisions, and anticipatory care needs.   Clinical Assessment and Goals of Care: I have reviewed medical records including EPIC notes, labs, and imaging. Received report from primary RN - no acute concerns. RN states patient is more awake and oriented tonight and is drinking water.   Went to visit patient at bedside - no family present. Patient was lying in bed awake, alert, and oriented to self and time only - she was disoriented to place and situation. She stated "I'm not the patient" and did not where she was or why. She denied pain but did endorse some shortness of breath. She is on 2L [place]. No signs or non-verbal gestures of pain or  discomfort noted. No respiratory distress, increased work of breathing, or secretions noted.   Met with patient's daughter/Karen HCPOA via phone to discuss diagnosis, prognosis, GOC, EOL wishes, disposition, and options.  I introduced Palliative Medicine as specialized medical care for people living with serious illness. It focuses on providing relief from the symptoms and stress of a serious illness. The goal is to improve quality of life for both the patient and the family.  We discussed a brief life review of the patient, functional, nutritional status. Santiago Glad expressing noticing a decline in the patient's functional and mental status over the last year. After the patient's last hospitalization, she went to [facility] for rehab and was planned to move to their LTC facility after rehab was completed. While in rehab, Santiago Glad stated the patient made minimal improvements - standing for 60 seconds and only able to take 5 max assist steps.  We discussed patient's current illness and what it means in the larger context of patient's on-going co-morbidities. Santiago Glad had a clear understanding of the patient's current medical situation. Natural disease trajectory and expectations at EOL were discussed. I attempted to elicit values and goals of care important to the patient. The difference between aggressive medical intervention and comfort care was considered in light of the patient's goals of care.  Santiago Glad stated that she wanted to keep the patient comfortable, but were not ready to pursue comfort measures at this time. After clarification and further discussion on goals of care, Santiago Glad stated that she would like to continue full scope medical treatment for the patient while in house, then discharge with hospice services at [facility]. Santiago Glad was clear that she did not want the patient to be rehospitalized after discharge and wanted to focus on comfort at that time.  Advance directives, concepts specific to code  status, artificial feeding and hydration, and rehospitalization were considered and discussed. Santiago Glad wanted to continue DNR/DNI status and was clear that she believed the patient did not want to prolong her life via artificial nutrition. Santiago Glad stated that the patient was able to complete a Living Will during her last hospitalization and she wanted to support the patient's wishes.   Discussed with Santiago Glad the importance of continued conversation with family and the medical providers regarding overall plan of care and treatment options, ensuring decisions are within the context of the patient's values and GOCs.  Questions and concerns were addressed. The family was encouraged to call with questions or concerns. PMT number was provided.   Primary Decision Maker: HCPOA [place] - HCPOA    SUMMARY OF RECOMMENDATIONS    Continue full scope medical treatment  Continue DNR/DNI as previously documented  Daughter/HCPOA's goal is for patient to be medically optimized, to feel as best she can for discharge. Daughter does not want the patient rehospitalized after discharge and wants to focus on patient's comfort.   Daughter is agreeable to hospice services at [facility] on discharge  [facility] consult placed for hospice evaluation  PMT will continue to follow peripherally. If there are any imminent needs please call the service directly  Code Status/Advance Care Planning:  DNR  Palliative Prophylaxis:   Aspiration, Bowel Regimen, Frequent Pain Assessment, Oral Care and Turn Reposition  Additional Recommendations (Limitations, Scope, Preferences):  Full Scope Treatment  Psycho-social/Spiritual:   Created space and opportunity for patient and family to express thoughts and feelings regarding patient's current medical situation.   Emotional support provided.  Prognosis:   < 6 months  Discharge Planning: To Be Determined      Primary Diagnoses: Present on  Admission: **None**   I have reviewed the medical record, interviewed the patient and family, and examined the patient. The following aspects are pertinent.  Past Medical History:  Diagnosis Date  . Acute lower UTI 11/27/2013  . Acute on chronic renal insufficiency   . Anemia   . Brady-tachy syndrome ([place])    a. 2003: post-op afib after MR repair then symptomatic bradycardia requiring pacemaker. b. Upgrade to Medtronic Bi-V Pacemaker 2007.  . Bronchiectasis with acute exacerbation ([place])   . Cancer of cecum ([place])   . Cellulitis and abscess of foot 08/15/2015   RT FOOT  . Chronic atrial fibrillation ([place])    a. Previously failed DCCV/amio/tikosyn.  CHADS2VASC score 5 - not on anticoagulation due to history of GI bleeding.    . Chronic combined systolic and diastolic CHF (congestive heart failure) (HCC)    a. EF 35-40% on echo 09/2013 b. echo 04/2016: EF 50-55% with mild to moderate MS.  [name] Chronic diastolic CHF (congestive heart failure) ([place])    EF 35-40% on echo 09/2013 but now 50-55% by echo 04/2016  . Cirrhosis ([place])    a. Newly recognized 09/2013 - by CT.  . CKD (chronic kidney disease)   . Emesis   .  Gastritis with hemorrhage   . GI bleeding   . Gout 11/07/2013  . History of gout 08/15/2015  . Hydronephrosis of left kidney 06/12/2020  . Hypertension   . Iron deficiency anemia due to chronic blood loss 06/21/2020  . Left ureteral stone 06/12/2020  . Liver cirrhosis ([place]) 10/10/2013  . Mitral valve disorder    a. Severe MR s/p repair 2003 (28 mm annuloplasty ring and and oversew of LAA). No CAD by cath at that time.  [name] NSVT (nonsustained ventricular tachycardia) ([place])    a. Isolated event during 09/2007 adm.  . NSVT (nonsustained ventricular tachycardia) ([place])   . Orthostatic hypotension 10/31/2013  . Orthostatic hypotension dysautonomic syndrome ([place])   . Permanent atrial fibrillation ([place]) 10/28/2013  . Pulmonary hypertension (HCC)    Group II secondary to CHF and MV disease   . Respiratory distress 10/06/2017  . Sepsis secondary to UTI ([place]) 06/12/2020  . Spinal stenosis    shes recieved epidural steroid injections in the past  . Syncope and collapse 04/04/2020  . Tricuspid regurgitation    Social History   Socioeconomic History  . Marital status: Single    Spouse name: Not on file  . Number of children: Not on file  . Years of education: Not on file  . Highest education level: Not on file  Occupational History  . Not on file  Tobacco Use  . Smoking status: Former Smoker    Quit date: 10/11/1971    Years since quitting: 48.8  . Smokeless tobacco: Never Used  Substance and Sexual Activity  . Alcohol use: Yes    Comment: rare  . Drug use: No  . Sexual activity: Not on file  Other Topics Concern  . Not on file  Social History Narrative  . Not on file   Social Determinants of Health   Financial Resource Strain:   . Difficulty of Paying Living Expenses: Not on file  Food Insecurity:   . Worried About Charity fundraiser in the Last Year: Not on file  . Ran Out of Food in the Last Year: Not on file  Transportation Needs:   . Lack of Transportation (Medical): Not on file  . Lack of Transportation (Non-Medical): Not on file  Physical Activity:   . Days of Exercise per Week: Not on file  . Minutes of Exercise per Session: Not on file  Stress:   . Feeling of Stress : Not on file  Social Connections:   . Frequency of Communication with Friends and Family: Not on file  . Frequency of Social Gatherings with Friends and Family: Not on file  . Attends Religious Services: Not on file  . Active Member of Clubs or Organizations: Not on file  . Attends Archivist Meetings: Not on file  . Marital Status: Not on file   Family History  Problem Relation Age of Onset  . Pancreatic cancer Father   . Other Mother        Mother died at 48 with no real medical problems   Scheduled Meds: . (feeding supplement) PROSource Plus  30 mL Oral BID BM   . ascorbic acid  500 mg Oral Daily  . febuxostat  80 mg Oral Daily  . ferrous sulfate  325 mg Oral BID WC  . furosemide  80 mg Intravenous BID  . heparin  5,000 Units Subcutaneous Q12H  . loratadine  10 mg Oral Daily  . [START ON 07/28/2020] midodrine  7.5 mg Oral TID  WC  . multivitamin  1 tablet Oral Daily  . nutrition supplement (JUVEN)  1 packet Oral Daily  . pantoprazole  40 mg Oral q morning - 10a  . senna  1 tablet Oral Daily  . sodium bicarbonate  650 mg Oral BID  . sodium chloride flush  3 mL Intravenous Q12H   Continuous Infusions: . sodium chloride    . albumin human 25 g ( 1852)  . cefTRIAXone (ROCEPHIN)  IV 1 g (07/24/2020 1800)   PRN Meds:.sodium chloride, acetaminophen, ondansetron (ZOFRAN) IV, risperiDONE, sodium chloride flush Medications Prior to Admission:  Prior to Admission medications   Medication Sig Start Date End Date Taking? Authorizing Provider  acetaminophen (TYLENOL) 500 MG tablet Take 2 tablets (1,000 mg total) by mouth every 6 (six) hours as needed for mild pain or moderate pain. 07/14/20  Yes Hongalgi, Lenis Dickinson, MD  ascorbic acid (VITAMIN C) 500 MG tablet Take 500 mg by mouth daily.   Yes [provider]  cephALEXin (KEFLEX) 500 MG capsule Take 500 mg by mouth 2 (two) times daily. Started on 07/19/20 for UTI @@ [facility]   Yes [provider]  cetirizine (ZYRTEC) 10 MG tablet Take 10 mg by mouth daily.   Yes [provider]  Febuxostat 80 MG TABS Take 0.5 tablets (40 mg total) by mouth daily. Patient taking differently: Take 80 mg by mouth daily.  07/14/20  Yes Hongalgi, Lenis Dickinson, MD  ferrous sulfate 325 (65 FE) MG tablet Take 1 tablet (325 mg total) by mouth 2 (two) times daily with a meal. 05/22/16  Yes Strader, [place] M, PA-C  midodrine (PROAMATINE) 5 MG tablet Take 1.5 tablets (7.5 mg total) by mouth 3 (three) times daily with meals. Patient taking differently: Take 5 mg by mouth 3 (three) times daily with meals.   07/14/20  Yes Hongalgi, Lenis Dickinson, MD  Multiple Vitamins-Minerals (OCUVITE ADULT 50+) CAPS Take 1 capsule by mouth daily.   Yes [provider]  nutrition supplement, JUVEN, (JUVEN) PACK Take 1 packet by mouth daily. Give with beverage of choice   Yes [provider]  Nutritional Supplements (,FEEDING SUPPLEMENT, PROSOURCE PLUS) liquid Take 30 mLs by mouth 2 (two) times daily between meals. 07/14/20  Yes Hongalgi, Lenis Dickinson, MD  pantoprazole (PROTONIX) 40 MG tablet Take 40 mg by mouth every morning. 05/30/20  Yes [provider]  potassium chloride SA (KLOR-CON) 20 MEQ tablet Take 1 tablet (20 mEq total) by mouth daily. 04/07/20  Yes Vann, Jessica U, DO  risperiDONE (RISPERDAL) 0.5 MG tablet Take 0.5 mg by mouth 2 (two) times daily. As needed for delusions, psychosis, aggressive behavior   Yes [provider]  senna (SENOKOT) 8.6 MG TABS tablet Take 1 tablet by mouth daily.   Yes [provider]  sodium bicarbonate 650 MG tablet Take 1 tablet (650 mg total) by mouth 2 (two) times daily. 07/14/20  Yes Hongalgi, Lenis Dickinson, MD  torsemide (DEMADEX) 20 MG tablet Take 1 tablet (20 mg total) by mouth daily. 04/26/20  Yes Turner, Eber Hong, MD   No Known Allergies Review of Systems  Unable to perform ROS: Mental status change    Physical Exam Constitutional:      General: She is not in acute distress. Pulmonary:     Effort: No respiratory distress.  Skin:    General: Skin is warm and dry.  Neurological:     Mental Status: She is alert. She is disoriented and confused.  Motor: Weakness present.  Psychiatric:        Behavior: Behavior is cooperative.        Cognition and Memory: Cognition is impaired. Memory is impaired.     Vital Signs: BP (!) 92/39   Pulse 70   Temp (!) 94 F (34.4 C) (Rectal)   Resp 18   LMP 10/10/2013   SpO2 98%          SpO2: SpO2: 98 % O2 Device:SpO2: 98 % O2 Flow Rate: .O2 Flow Rate (L/min): 2 L/min  IO: Intake/output  summary: No intake or output data in the 24 hours ending 08/14/2020 1911  LBM:   Baseline Weight:   Most recent weight:       Palliative Assessment/Data: PPS 30%     Time In: 1830 Time Out: 1940 Time Total: 70 minutes  Greater than 50%  of this time was spent counseling and coordinating care related to the above assessment and plan.  Signed by: Lin Landsman, NP   Please contact Palliative Medicine Team phone at [phone] for questions and concerns.  For individual provider: See Shea Evans

## 2020-07-27 NOTE — Telephone Encounter (Signed)
Scheduled per [facility]. [place]. Left msg for [employer] in transportation. Left call back number

## 2020-07-27 NOTE — Consult Note (Addendum)
Cardiology Consultation:   Patient ID: [name] MRN: [id number]; DOB: 1925/12/29  Admit date: 08/02/2020 Date of Consult: 07/24/2020  Primary Care Provider: [name], MD [facility] Cardiologist: [name], MD  [place] Electrophysiologist:  None    Patient Profile:   [name] is a 86 y.o. female with a hx of biventricular HFrEF with EF of 40-45%, orthostatic hypotension dysautonomic syndrome on midodrine, severe MR s/p repair (2003, tachybrady syndrome s/o BiV PPM (2007), permanent Afib, moderate pulmonary hypertension group 2, CKD3b, Cirrhosis, recent dx of adenocarcinoma s/p resection (06/22/20), recent obstructive uropathy s/p stent placement (07/10/2020) who is being seen today for the evaluation of acute exacerbation of HFrEF at the request of Dr. Roosevelt Locks.  History of Present Illness:   [name] states she does not know what brought her in today. She notes she is uncomfortable all over but denies a focal point of pain, including chest pain, abdominal pain. She denies any difficulty breathing at this time.   [name] daughter, Santiago Glad, is a bedside to assist with history. She states that SNF staff called her this morning to inform her that they found [name] unresponsive this morning and hypoxic to the low 70s. She was brought to the ED via EMS for further evaluation and stabilization. She notes that patient has a history of lower leg swelling but nothing comparable to current swelling.   Per EDP, patient was placed on CPAP en route to the ED with improvement of oxygen saturations to the 90s. She was eventually able to be weaned to nasal cannula without further desaturations. On arrival to the ED, patient was noted to be hypothermic with temperature of 94 F, tachypneic, and hypotension with BP of 98/59. Initial lab work remarkable for hypernatremia, AKI on CKD, albumin of 2.2, stable hemoglobin and mild thrombocytopenia. Imaging obtained include CXR, CT head  and CT A/P. CXR shows bilateral moderate pleural effusions with bilateral pulmonary infiltrates/edema. CT hed negative for acute intracranial abnormalities. CT A/P shows no acute abnormalities but stable and unchanged findings including cirrhosis, punctate nephrolithiasis, aneurysm of the porto-splenic confluence.   Past Medical History:  Diagnosis Date  . Acute lower UTI 11/27/2013  . Acute on chronic renal insufficiency   . Anemia   . Brady-tachy syndrome ([place])    a. 2003: post-op afib after MR repair then symptomatic bradycardia requiring pacemaker. b. Upgrade to Medtronic Bi-V Pacemaker 2007.  . Bronchiectasis with acute exacerbation ([place])   . Cancer of cecum ([place])   . Cellulitis and abscess of foot 08/15/2015   RT FOOT  . Chronic atrial fibrillation ([place])    a. Previously failed DCCV/amio/tikosyn.  CHADS2VASC score 5 - not on anticoagulation due to history of GI bleeding.    . Chronic combined systolic and diastolic CHF (congestive heart failure) (HCC)    a. EF 35-40% on echo 09/2013 b. echo 04/2016: EF 50-55% with mild to moderate MS.  [name] Chronic diastolic CHF (congestive heart failure) ([place])    EF 35-40% on echo 09/2013 but now 50-55% by echo 04/2016  . Cirrhosis ([place])    a. Newly recognized 09/2013 - by CT.  . CKD (chronic kidney disease)   . Emesis   . Gastritis with hemorrhage   . GI bleeding   . Gout 11/07/2013  . History of gout 08/15/2015  . Hydronephrosis of left kidney 06/12/2020  . Hypertension   . Iron deficiency anemia due to chronic blood loss 06/21/2020  . Left ureteral stone 06/12/2020  .  Liver cirrhosis ([place]) 10/10/2013  . Mitral valve disorder    a. Severe MR s/p repair 2003 (28 mm annuloplasty ring and and oversew of LAA). No CAD by cath at that time.  [name] NSVT (nonsustained ventricular tachycardia) ([place])    a. Isolated event during 09/2007 adm.  . NSVT (nonsustained ventricular tachycardia) ([place])   . Orthostatic hypotension 10/31/2013  . Orthostatic hypotension  dysautonomic syndrome ([place])   . Permanent atrial fibrillation ([place]) 10/28/2013  . Pulmonary hypertension (HCC)    Group II secondary to CHF and MV disease  . Respiratory distress 10/06/2017  . Sepsis secondary to UTI ([place]) 06/12/2020  . Spinal stenosis    shes recieved epidural steroid injections in the past  . Syncope and collapse 04/04/2020  . Tricuspid regurgitation     Past Surgical History:  Procedure Laterality Date  . APPENDECTOMY    . BALLOON DILATION N/A 06/05/2020   Procedure: BALLOON DILATION;  Surgeon: Otis Brace, MD;  Location: [facility];  Service: Gastroenterology;  Laterality: N/A;  . BIOPSY  06/05/2020   Procedure: BIOPSY;  Surgeon: Otis Brace, MD;  Location: [facility];  Service: Gastroenterology;;  . BIOPSY  06/18/2020   Procedure: BIOPSY;  Surgeon: Wilford Corner, MD;  Location: [facility];  Service: Endoscopy;;  . BIV PACEMAKER GENERATOR CHANGE OUT N/A 12/13/2014   Procedure: BIV PACEMAKER GENERATOR CHANGE OUT;  Surgeon: Evans Lance, MD;  Location: [facility] CATH LAB;  Service: Cardiovascular;  Laterality: N/A;  . CHOLECYSTECTOMY    . COLONOSCOPY N/A 06/18/2020   Procedure: COLONOSCOPY;  Surgeon: Wilford Corner, MD;  Location: [facility];  Service: Endoscopy;  Laterality: N/A;  Either to be done by Dr. Michail Sermon or Dr. Paulita Fujita  . CYSTOSCOPY WITH STENT PLACEMENT Left 06/11/2020   Procedure: CYSTOSCOPY WITH STENT PLACEMENT;  Surgeon: Irine Seal, MD;  Location: [facility];  Service: Urology;  Laterality: Left;  . CYSTOSCOPY/URETEROSCOPY/HOLMIUM LASER/STENT PLACEMENT Left 07/10/2020   Procedure: LEFT URETEROSCOPY/HOLMIUM LASER/STENT EXCHANGE;  Surgeon: Irine Seal, MD;  Location: [facility];  Service: Urology;  Laterality: Left;  . ESOPHAGOGASTRODUODENOSCOPY (EGD) WITH PROPOFOL N/A 06/05/2020   Procedure: ESOPHAGOGASTRODUODENOSCOPY (EGD) WITH PROPOFOL;  Surgeon: Otis Brace, MD;  [facility];  Service: Gastroenterology;  Laterality: N/A;  .  IRRIGATION AND DEBRIDEMENT ABSCESS N/A 10/14/2013   Procedure: IRRIGATION AND DEBRIDEMENT PERINEAL ABSCESS;  Surgeon: Rolm Bookbinder, MD;  Location: [place];  Service: General;  Laterality: N/A;  . LAPAROSCOPIC RIGHT COLECTOMY Right 06/22/2020   Procedure: LAPAROSCOPIC RIGHT COLECTOMY;  Surgeon: Jovita Kussmaul, MD;  Location: [facility];  Service: General;  Laterality: Right;  . SUBMUCOSAL TATTOO INJECTION  06/18/2020   Procedure: SUBMUCOSAL TATTOO INJECTION;  Surgeon: Wilford Corner, MD;  [facility]: [facility];  Service: Endoscopy;;  . VALVE REPLACEMENT     sever mitral regurgitation s/p mitral valve annuloplasty ring     Home Medications:  Prior to Admission medications   Medication Sig Start Date End Date Taking? Authorizing Provider  acetaminophen (TYLENOL) 500 MG tablet Take 2 tablets (1,000 mg total) by mouth every 6 (six) hours as needed for mild pain or moderate pain. 07/14/20  Yes Hongalgi, Lenis Dickinson, MD  ascorbic acid (VITAMIN C) 500 MG tablet Take 500 mg by mouth daily.   Yes [provider]  cephALEXin (KEFLEX) 500 MG capsule Take 500 mg by mouth 2 (two) times daily. Started on 07/19/20 for UTI @@ [facility]   Yes [provider]  cetirizine (ZYRTEC) 10 MG tablet Take 10 mg by mouth daily.  Yes [provider]  Febuxostat 80 MG TABS Take 0.5 tablets (40 mg total) by mouth daily. Patient taking differently: Take 80 mg by mouth daily.  07/14/20  Yes Hongalgi, Lenis Dickinson, MD  ferrous sulfate 325 (65 FE) MG tablet Take 1 tablet (325 mg total) by mouth 2 (two) times daily with a meal. 05/22/16  Yes Strader, [place] M, PA-C  midodrine (PROAMATINE) 5 MG tablet Take 1.5 tablets (7.5 mg total) by mouth 3 (three) times daily with meals. Patient taking differently: Take 5 mg by mouth 3 (three) times daily with meals.  07/14/20  Yes Hongalgi, Lenis Dickinson, MD  Multiple Vitamins-Minerals (OCUVITE ADULT 50+) CAPS Take 1 capsule by mouth daily.   Yes [provider]  nutrition  supplement, JUVEN, (JUVEN) PACK Take 1 packet by mouth daily. Give with beverage of choice   Yes [provider]  Nutritional Supplements (,FEEDING SUPPLEMENT, PROSOURCE PLUS) liquid Take 30 mLs by mouth 2 (two) times daily between meals. 07/14/20  Yes Hongalgi, Lenis Dickinson, MD  pantoprazole (PROTONIX) 40 MG tablet Take 40 mg by mouth every morning. 05/30/20  Yes [provider]  potassium chloride SA (KLOR-CON) 20 MEQ tablet Take 1 tablet (20 mEq total) by mouth daily. 04/07/20  Yes Vann, Jessica U, DO  risperiDONE (RISPERDAL) 0.5 MG tablet Take 0.5 mg by mouth 2 (two) times daily. As needed for delusions, psychosis, aggressive behavior   Yes [provider]  senna (SENOKOT) 8.6 MG TABS tablet Take 1 tablet by mouth daily.   Yes [provider]  sodium bicarbonate 650 MG tablet Take 1 tablet (650 mg total) by mouth 2 (two) times daily. 07/14/20  Yes Hongalgi, Lenis Dickinson, MD  torsemide (DEMADEX) 20 MG tablet Take 1 tablet (20 mg total) by mouth daily. 04/26/20  Yes Sueanne Margarita, MD    Inpatient Medications: Scheduled Meds: . (feeding supplement) PROSource Plus  30 mL Oral BID BM  . ascorbic acid  500 mg Oral Daily  . febuxostat  80 mg Oral Daily  . ferrous sulfate  325 mg Oral BID WC  . heparin  5,000 Units Subcutaneous Q12H  . loratadine  10 mg Oral Daily  . midodrine  5 mg Oral TID WC  . multivitamin-lutein   Oral Daily  . nutrition supplement (JUVEN)  1 packet Oral Daily  . pantoprazole  40 mg Oral q morning - 10a  . senna  1 tablet Oral Daily  . sodium bicarbonate  650 mg Oral BID  . sodium chloride flush  3 mL Intravenous Q12H   Continuous Infusions: . sodium chloride    . cefTRIAXone (ROCEPHIN)  IV     PRN Meds: sodium chloride, acetaminophen, ondansetron (ZOFRAN) IV, risperiDONE, sodium chloride flush  Allergies:   No Known Allergies  Social History:   Social History   Socioeconomic History  . Marital status: Single    Spouse name: Not on file   . Number of children: Not on file  . Years of education: Not on file  . Highest education level: Not on file  Occupational History  . Not on file  Tobacco Use  . Smoking status: Former Smoker    Quit date: 10/11/1971    Years since quitting: 48.8  . Smokeless tobacco: Never Used  Substance and Sexual Activity  . Alcohol use: Yes    Comment: rare  . Drug use: No  . Sexual activity: Not on file  Other Topics Concern  . Not on file  Social History  Narrative  . Not on file   Social Determinants of Health   Financial Resource Strain:   . Difficulty of Paying Living Expenses: Not on file  Food Insecurity:   . Worried About Charity fundraiser in the Last Year: Not on file  . Ran Out of Food in the Last Year: Not on file  Transportation Needs:   . Lack of Transportation (Medical): Not on file  . Lack of Transportation (Non-Medical): Not on file  Physical Activity:   . Days of Exercise per Week: Not on file  . Minutes of Exercise per Session: Not on file  Stress:   . Feeling of Stress : Not on file  Social Connections:   . Frequency of Communication with Friends and Family: Not on file  . Frequency of Social Gatherings with Friends and Family: Not on file  . Attends Religious Services: Not on file  . Active Member of Clubs or Organizations: Not on file  . Attends Archivist Meetings: Not on file  . Marital Status: Not on file  Intimate Partner Violence:   . Fear of Current or Ex-Partner: Not on file  . Emotionally Abused: Not on file  . Physically Abused: Not on file  . Sexually Abused: Not on file    Family History:    Family History  Problem Relation Age of Onset  . Pancreatic cancer Father   . Other Mother        Mother died at 43 with no real medical problems     ROS:  Please see the history of present illness.   All other ROS reviewed and negative.     Physical Exam/Data:   Vitals:   08/10/2020 1415 08/07/2020 1430 07/24/2020 1445 08/15/2020 1500    BP: (!) 96/49 (!) 93/47 (!) 97/47 101/72  Pulse: 70 71 69 69  Resp: 15 20 18 16   Temp:      TempSrc:      SpO2: 100% 93% 100% 100%   No intake or output data in the 24 hours ending 07/22/2020 1523 Last 3 Weights 07/25/2020 07/14/2020 07/12/2020  Weight (lbs) 203 lb 211 lb 6.7 oz 210 lb 1.6 oz  Weight (kg) 92.08 kg 95.9 kg 95.301 kg     There is no height or weight on file to calculate BMI.  General:  Lethargic but cooperative with examination, no acute distress HEENT: normal Neck: + JVD Cardiac:  Distant heart sounds. Normal S1, S2; RRR; no murmur Lungs:  Decreased breathe sounds throughout. No respiratory distress. No tachypnea Abd: Soft, nontender, no hepatomegaly. Bilateral abdominal wall edema. Wound vac present on the left periumbilical region that is clean and dry without surrounding erythema Ext: 2+ pitting edema up to the thighs  Skin: warm and dry  Neuro:  No focal abnormalities noted Psych:  Normal affect   EKG:  The EKG was personally reviewed and demonstrates: Ventricular paced with atrial fibrillation.   Relevant CV Studies:  TTE (07/09/2020)  1. Left ventricular ejection fraction, by estimation, is 40 to 45%. The  left ventricle has mildly decreased function. The left ventricle  demonstrates global hypokinesis. Left ventricular diastolic function could  not be evaluated.  2. Right ventricular systolic function is mildly reduced. The right  ventricular size is mildly enlarged. There is moderately elevated  pulmonary artery systolic pressure. The estimated right ventricular  systolic pressure is 64.4 mmHg.  3. Left atrial size was severely dilated.  4. Right atrial size was severely dilated.  5.  The mitral valve has been repaired/replaced. Trivial mitral valve  regurgitation. The mean mitral valve gradient is 4.0 mmHg with average  heart rate of 70 bpm. There is a 28 mm prosthetic annuloplasty ring  present in the mitral position.  6. The pericardial effusion is  posterior to the left ventricle. Moderate  pleural effusion in the left lateral region.  7. The tricuspid valve is abnormal. Tricuspid valve regurgitation is  moderate to severe.  8. The aortic valve is tricuspid. Mild aortic valve sclerosis is present,  with no evidence of aortic valve stenosis.  9. The inferior vena cava is dilated in size with <50% respiratory  variability, suggesting right atrial pressure of 15 mmHg.   Comparison(s): Changes from prior study are noted. 04/05/20: LVEF 40%,  severe TR.   Laboratory Data:  High Sensitivity Troponin:   Recent Labs  Lab 08/03/2020 1021 07/26/2020 1223  TROPONINIHS 21* 20*     Chemistry Recent Labs  Lab 07/25/20 1109 07/25/20 1109 07/28/2020 1021 08/09/2020 1021 08/06/2020 1048 08/16/2020 1050 07/23/2020 1409  NA 149*   < > 149*   < > 149* 149* 150*  K 4.4   < > 3.9   < > 3.8 4.1 3.8  CL 117*  --  114*  --  113*  --   --   CO2 23  --  26  --   --   --   --   GLUCOSE 78  --  84  --  76  --   --   BUN 74*  --  67*  --  64*  --   --   CREATININE 1.76*  --  1.96*  --  2.00*  --   --   CALCIUM 7.8*  --  7.8*  --   --   --   --   GFRNONAA 25*  --  22*  --   --   --   --   GFRAA 29*  --  25*  --   --   --   --   ANIONGAP 9  --  9  --   --   --   --    < > = values in this interval not displayed.    Recent Labs  Lab 07/25/20 1109 08/10/2020 1021  PROT 5.1* 4.9*  ALBUMIN 2.1* 2.2*  AST 18 25  ALT 13 16  ALKPHOS 128* 124  BILITOT 0.5 0.8   Hematology Recent Labs  Lab 07/25/20 1108 07/25/20 1109 07/25/20 1109 07/21/2020 1021 08/03/2020 1021 07/23/2020 1048 07/31/2020 1050 07/28/2020 1409  WBC  --  5.7  --  6.4  --   --   --   --   RBC 3.56* 3.51*  --  3.48*  --   --   --   --   HGB  --  9.3*  --  9.3*   < > 9.9* 10.2* 9.5*  HCT  --  32.7*   < > 33.6*   < > 29.0* 30.0* 28.0*  MCV  --  93.2  --  96.6  --   --   --   --   MCH  --  26.5  --  26.7  --   --   --   --   MCHC  --  28.4*  --  27.7*  --   --   --   --   RDW  --  23.1*  --   22.8*  --   --   --   --  PLT  --  155  --  141*  --   --   --   --    < > = values in this interval not displayed.   BNP Recent Labs  Lab 07/23/2020 1022  BNP 298.4*    DDimer No results for input(s): DDIMER in the last 168 hours.   Radiology/Studies:  No results found. {  Assessment and Plan:   1. Anasarca:  - Diffuse anasarca on examination today.  - Likely multifactorial given worsening in renal function, hypoalbuminemia (hx of cirrhosis and poor PO), and history of HFrEF.  - Albumin has been ordered to start today. - Will also start with IV diruesis   2. Acute on Chronic HFrEF - Home medication includes Torsemide 20 mg and Kdur 20 mEq daily. Unable to tolerate BB, ACEi/ARBs due to hypotension.  - Most recent TTE approximately 3 weeks ago shows stable EF of 40-45% with biventricular failure. She was quite volume overloaded during the study with estimated right atrial pressure of 15 mmhg.  - BNP (= 298) is not significantly elevated and improved compared to last value.  - Will start with IV diuresis at this time, Lasix 80 mg BID IV and follow in/outs closely. - Monitor renal function daily   3. Orthostatic hypotension dysautonomic syndrome  - Home medication includes Midodrine 7.5 mg TID. - Hypotensive on arrival with MAPs around 65.  - She has not received any midodrine today and appears intravascularly depleted, so will receive Albumin and plan to restart Midodrine.  4. Permanent Atrial fibrillation  - Rate today in the low 70s.  - CHADS-VASc = 5  - Unable to tolerate anti-coagulation due to previous GI bleed - No further intervention at this time.   5.  Severe MR s/p repair (2003) - Trivial MR noted on last echo  - Stable at this time.   6. Moderate - Severe TR - Patient is not surgical candidate at this time.   7. Tachybrady syndrome s/p BiV PPM - Stable  For questions or updates, please contact [facility] Please consult [email or web address] for contact info  under    Signed, Jose Persia, MD  08/10/2020 3:23 PM   As above, patient seen and examined.  Briefly she is a 86 year old female with past medical history of permanent atrial fibrillation, prior mitral valve repair, previous pacemaker secondary to tachybradycardia syndrome, chronic combined systolic/diastolic congestive heart failure, cirrhosis, chronic stage III kidney disease, orthostatic hypotension required midodrine, status post recent resection of colon cancer, recent left hydronephrosis requiring ureteral stent now for evaluation of acute on chronic combined systolic/diastolic congestive heart failure.  Recently discharged following admission for urinary sepsis with associated obstructive uropathy requiring ureteral stent and resection of adenocarcinoma of the cecum.  Echocardiogram August 2021 showed ejection fraction 40 to 45%, mild right ventricular enlargement with mild RV dysfunction, severe biatrial enlargement, prior mitral valve repair with trace mitral regurgitation and mean gradient 4 mmHg, moderate to severe tricuspid regurgitation and moderate pulmonary hypertension.  Patient was found to be hypoxic, hypothermic and unresponsive today.  Saturations by report in the 45s.  She was brought to the emergency room and placed on O2 with some improvement in her mentation.  Patient at present states she feels "bad all over".  She denies dyspnea or chest pain.  Cardiology asked to evaluate for acute on chronic combined systolic/diastolic congestive heart failure.  On physical exam she is markedly volume overloaded with abdominal wall edema, 3-4+ lower extremity edema bilaterally.  Laboratory  show sodium 149, BUN 64, creatinine 2.0, hemoglobin 9.5.  Albumin 2.2.  BNP 298.  Electrocardiogram shows ventricular pacing with probable underlying atrial fibrillation.  Chest x-ray shows CHF and bilateral pleural effusions.  1 acute on chronic combined systolic/diastolic congestive heart failure-patient is  markedly volume overloaded on examination.  This is likely multifactorial including pulmonary venous hypertension, severe tricuspid regurgitation, hypoalbuminemia and worsening renal function.  Recent echocardiogram showed ejection fraction 40 to 45% with stable mitral valve repair and moderate to severe tricuspid regurgitation.  Patient sodium is elevated at 149.  She may be intravascularly depleted.  Agree with albumin.  We will diurese with Lasix 80 mg IV twice daily.  Follow renal function and sodium closely.  2 status post mitral valve repair-stable on most recent echocardiogram.  3 cardiomyopathy with ejection fraction 40 to 45%-patient has borderline blood pressure and requires midodrine and also significant renal insufficiency.  She is therefore not a candidate for beta-blockade or ARB/Entresto.  4 question sepsis-antibiotics have been initiated by primary care.  5 status post recent resection of colon cancer  6 orthostatic hypotension-continue midodrine and follow blood pressure with diuresis.  Patient is critically ill with multiple comorbidities as outlined.  She is a no CODE BLUE.  Needs palliative care consult.  Kirk Ruths, MD

## 2020-07-27 NOTE — ED Notes (Signed)
While getting pt ready to go to CT, pt suddenly opened her eyes and looked around. She was able to move her fingers and toes on command. She also was able to nod and tell me when she felt me touching her legs and arms. Once pt was moved over to CT bed, she reverted back to not opening her eyes.

## 2020-07-27 NOTE — ED Triage Notes (Signed)
Pt bib [facility], nursing staff found pt in respiratory distress and unresponsive. Pt in low 70's on RA went to low 90's on CPAP. NRB placed on 15L O2 stayed in 90's. EMS reported lung sounds diminished with rales.  Pt not oriented but able to follow commands with eyes and breath sounds. Pt flaccid, does not move extremities. Has pitting edema of lower and upper extremities. Wound vac noted.  BP: 126/67 O2: 96

## 2020-07-27 NOTE — ED Notes (Signed)
Pt transported to CT ?

## 2020-07-27 NOTE — H&P (Signed)
History and Physical    [name] FAO:[id number] DOB: Nov 23, 1925 DOA: 08/06/2020  PCP: [name], MD (Confirm with patient/family/NH records and if not entered, this has to be entered at [facility] point of entry) Patient coming from: SNF  I have personally briefly reviewed patient's old medical records in [place]  Chief Complaint: Cough shortness of breath  HPI: [name] is a 86 y.o. female with medical history significant of chronic systolic CHF EF 69-62% 95/2841, orthostatic hypotension on midodrine, CKD stage III, tachybradycardia syndrome status post biventricular PPM 2007, severe MR s/p repair, chronic A. fib not on anticoagulation secondary to GI bleed, recent left-sided obstructive kidney stone status post stenting which was removed on 07/19/2020, recent, CAD status post right hemicolectomy 06/21/2020, presented with acute onset hypoxia and unresponsive.  Patient very lethargic unable to answer my questions, most history provided by patient daughter at bedside.  Patient was discharged to SNF 2 weeks ago after hospitalization during which time patient underwent partial colectomy and left ureteral stenting.  Daughter has been visiting patient regularly reported patient has been " building up fluid around her legs" but denied patient had any respiratory symptoms until yesterday patient started to have some dry cough but no complaint of shortness of breath.  This morning, patient was found unresponsive and O2 saturation in the 70s.  Patient was placed on nonrebreather 15 L, saturation in the low 90s and switched to CPAP and O2 saturation maintained.  Initially blood pressure was 126/67, chest x-ray showed bilateral pleural effusion and congestion, 1 dose of IV Lasix given.  Later in the ED, it was found patient blood pressure on the lower side, however patient O2 saturation much improved and stabilized on 2 L via nasal cannula.  CT abdomen without contrast done and result pending.   Patient was covered with broad-spectrum antibiotics vancomycin and Zosyn x1 in the ED.  Patient was found to be in hypothermia, temperature 94, placed on Bair hugger   Review of Systems: Unable to perform patient lethargic  Past Medical History:  Diagnosis Date  . Acute lower UTI 11/27/2013  . Acute on chronic renal insufficiency   . Anemia   . Brady-tachy syndrome ([place])    a. 2003: post-op afib after MR repair then symptomatic bradycardia requiring pacemaker. b. Upgrade to Medtronic Bi-V Pacemaker 2007.  . Bronchiectasis with acute exacerbation ([place])   . Cancer of cecum ([place])   . Cellulitis and abscess of foot 08/15/2015   RT FOOT  . Chronic atrial fibrillation ([place])    a. Previously failed DCCV/amio/tikosyn.  CHADS2VASC score 5 - not on anticoagulation due to history of GI bleeding.    . Chronic combined systolic and diastolic CHF (congestive heart failure) (HCC)    a. EF 35-40% on echo 09/2013 b. echo 04/2016: EF 50-55% with mild to moderate MS.  [name] Chronic diastolic CHF (congestive heart failure) ([place])    EF 35-40% on echo 09/2013 but now 50-55% by echo 04/2016  . Cirrhosis ([place])    a. Newly recognized 09/2013 - by CT.  . CKD (chronic kidney disease)   . Emesis   . Gastritis with hemorrhage   . GI bleeding   . Gout 11/07/2013  . History of gout 08/15/2015  . Hydronephrosis of left kidney 06/12/2020  . Hypertension   . Iron deficiency anemia due to chronic blood loss 06/21/2020  . Left ureteral stone 06/12/2020  . Liver cirrhosis ([place]) 10/10/2013  . Mitral valve disorder    a.  Severe MR s/p repair 2003 (28 mm annuloplasty ring and and oversew of LAA). No CAD by cath at that time.  [name] NSVT (nonsustained ventricular tachycardia) ([place])    a. Isolated event during 09/2007 adm.  . NSVT (nonsustained ventricular tachycardia) ([place])   . Orthostatic hypotension 10/31/2013  . Orthostatic hypotension dysautonomic syndrome ([place])   . Permanent atrial fibrillation ([place]) 10/28/2013  .  Pulmonary hypertension (HCC)    Group II secondary to CHF and MV disease  . Respiratory distress 10/06/2017  . Sepsis secondary to UTI ([place]) 06/12/2020  . Spinal stenosis    shes recieved epidural steroid injections in the past  . Syncope and collapse 04/04/2020  . Tricuspid regurgitation     Past Surgical History:  Procedure Laterality Date  . APPENDECTOMY    . BALLOON DILATION N/A 06/05/2020   Procedure: BALLOON DILATION;  Surgeon: Otis Brace, MD;  Location: [facility];  Service: Gastroenterology;  Laterality: N/A;  . BIOPSY  06/05/2020   Procedure: BIOPSY;  Surgeon: Otis Brace, MD;  Location: [facility];  Service: Gastroenterology;;  . BIOPSY  06/18/2020   Procedure: BIOPSY;  Surgeon: Wilford Corner, MD;  Location: [facility];  Service: Endoscopy;;  . BIV PACEMAKER GENERATOR CHANGE OUT N/A 12/13/2014   Procedure: BIV PACEMAKER GENERATOR CHANGE OUT;  Surgeon: Evans Lance, MD;  Location: [facility] CATH LAB;  Service: Cardiovascular;  Laterality: N/A;  . CHOLECYSTECTOMY    . COLONOSCOPY N/A 06/18/2020   Procedure: COLONOSCOPY;  Surgeon: Wilford Corner, MD;  Location: [facility];  Service: Endoscopy;  Laterality: N/A;  Either to be done by Dr. Michail Sermon or Dr. Paulita Fujita  . CYSTOSCOPY WITH STENT PLACEMENT Left 06/11/2020   Procedure: CYSTOSCOPY WITH STENT PLACEMENT;  Surgeon: Irine Seal, MD;  Location: [facility];  Service: Urology;  Laterality: Left;  . CYSTOSCOPY/URETEROSCOPY/HOLMIUM LASER/STENT PLACEMENT Left 07/10/2020   Procedure: LEFT URETEROSCOPY/HOLMIUM LASER/STENT EXCHANGE;  Surgeon: Irine Seal, MD;  Location: [facility];  Service: Urology;  Laterality: Left;  . ESOPHAGOGASTRODUODENOSCOPY (EGD) WITH PROPOFOL N/A 06/05/2020   Procedure: ESOPHAGOGASTRODUODENOSCOPY (EGD) WITH PROPOFOL;  Surgeon: Otis Brace, MD;  [facility];  Service: Gastroenterology;  Laterality: N/A;  . IRRIGATION AND DEBRIDEMENT ABSCESS N/A 10/14/2013   Procedure: IRRIGATION AND DEBRIDEMENT  PERINEAL ABSCESS;  Surgeon: Rolm Bookbinder, MD;  Location: [place];  Service: General;  Laterality: N/A;  . LAPAROSCOPIC RIGHT COLECTOMY Right 06/22/2020   Procedure: LAPAROSCOPIC RIGHT COLECTOMY;  Surgeon: Jovita Kussmaul, MD;  Location: [facility];  Service: General;  Laterality: Right;  . SUBMUCOSAL TATTOO INJECTION  06/18/2020   Procedure: SUBMUCOSAL TATTOO INJECTION;  Surgeon: Wilford Corner, MD;  [facility]: [facility];  Service: Endoscopy;;  . VALVE REPLACEMENT     sever mitral regurgitation s/p mitral valve annuloplasty ring     reports that she quit smoking about 48 years ago. She has never used smokeless tobacco. She reports current alcohol use. She reports that she does not use drugs.  No Known Allergies  Family History  Problem Relation Age of Onset  . Pancreatic cancer Father   . Other Mother        Mother died at 10 with no real medical problems     Prior to Admission medications   Medication Sig Start Date End Date Taking? Authorizing Provider  acetaminophen (TYLENOL) 500 MG tablet Take 2 tablets (1,000 mg total) by mouth every 6 (six) hours as needed for mild pain or moderate pain. 07/14/20  Yes Hongalgi, Lenis Dickinson, MD  ascorbic acid (VITAMIN C) 500 MG tablet  Take 500 mg by mouth daily.   Yes [provider]  cephALEXin (KEFLEX) 500 MG capsule Take 500 mg by mouth 2 (two) times daily. Started on 07/19/20 for UTI @@ [facility]   Yes [provider]  cetirizine (ZYRTEC) 10 MG tablet Take 10 mg by mouth daily.   Yes [provider]  Febuxostat 80 MG TABS Take 0.5 tablets (40 mg total) by mouth daily. Patient taking differently: Take 80 mg by mouth daily.  07/14/20  Yes Hongalgi, Lenis Dickinson, MD  ferrous sulfate 325 (65 FE) MG tablet Take 1 tablet (325 mg total) by mouth 2 (two) times daily with a meal. 05/22/16  Yes Strader, [place] M, PA-C  midodrine (PROAMATINE) 5 MG tablet Take 1.5 tablets (7.5 mg total) by mouth 3 (three) times daily with meals. Patient  taking differently: Take 5 mg by mouth 3 (three) times daily with meals.  07/14/20  Yes Hongalgi, Lenis Dickinson, MD  Multiple Vitamins-Minerals (OCUVITE ADULT 50+) CAPS Take 1 capsule by mouth daily.   Yes [provider]  nutrition supplement, JUVEN, (JUVEN) PACK Take 1 packet by mouth daily. Give with beverage of choice   Yes [provider]  Nutritional Supplements (,FEEDING SUPPLEMENT, PROSOURCE PLUS) liquid Take 30 mLs by mouth 2 (two) times daily between meals. 07/14/20  Yes Hongalgi, Lenis Dickinson, MD  pantoprazole (PROTONIX) 40 MG tablet Take 40 mg by mouth every morning. 05/30/20  Yes [provider]  potassium chloride SA (KLOR-CON) 20 MEQ tablet Take 1 tablet (20 mEq total) by mouth daily. 04/07/20  Yes Vann, Jessica U, DO  risperiDONE (RISPERDAL) 0.5 MG tablet Take 0.5 mg by mouth 2 (two) times daily. As needed for delusions, psychosis, aggressive behavior   Yes [provider]  senna (SENOKOT) 8.6 MG TABS tablet Take 1 tablet by mouth daily.   Yes [provider]  sodium bicarbonate 650 MG tablet Take 1 tablet (650 mg total) by mouth 2 (two) times daily. 07/14/20  Yes Hongalgi, Lenis Dickinson, MD  torsemide (DEMADEX) 20 MG tablet Take 1 tablet (20 mg total) by mouth daily. 04/26/20  Yes Sueanne Margarita, MD    Physical Exam: Vitals:   07/31/2020 1415 08/10/2020 1430 08/08/2020 1445 07/18/2020 1500  BP: (!) 96/49 (!) 93/47 (!) 97/47 101/72  Pulse: 70 71 69 69  Resp: 15 20 18 16   Temp:      TempSrc:      SpO2: 100% 93% 100% 100%    Constitutional: NAD, calm, comfortable Vitals:   08/05/2020 1415 08/07/2020 1430 08/13/2020 1445 07/18/2020 1500  BP: (!) 96/49 (!) 93/47 (!) 97/47 101/72  Pulse: 70 71 69 69  Resp: 15 20 18 16   Temp:      TempSrc:      SpO2: 100% 93% 100% 100%   Eyes: PERRL, lids and conjunctivae normal ENMT: Mucous membranes are dry. Posterior pharynx clear of any exudate or lesions.Normal dentition.  Neck: normal, supple, no masses, no  thyromegaly Respiratory: clear to auscultation bilaterally, diminished breathing sound bilateral lower fields, fine crackles on bilateral bases no wheezing. Normal respiratory effort. No accessory muscle use.  Cardiovascular: Regular rate and rhythm, no murmurs / rubs / gallops. 2+ extremity edema. 2+ pedal pulses. No carotid bruits.  Abdomen: no tenderness, no masses palpated. No hepatosplenomegaly. Bowel sounds positive.  Musculoskeletal: no clubbing / cyanosis. No joint deformity upper and lower extremities. Good ROM, no contractures. Normal muscle tone.  Skin: no rashes, lesions, ulcers. No induration Neurologic: Arousable, lethargic,  following simple command, moving all limbs. Psychiatric: Responds to voice, following commands    Labs on Admission: I have personally reviewed following labs and imaging studies  CBC: Recent Labs  Lab 07/25/20 1109 08/08/2020 1021 07/26/2020 1048 07/28/2020 1050 07/30/2020 1409  WBC 5.7 6.4  --   --   --   NEUTROABS 3.3 2.7  --   --   --   HGB 9.3* 9.3* 9.9* 10.2* 9.5*  HCT 32.7* 33.6* 29.0* 30.0* 28.0*  MCV 93.2 96.6  --   --   --   PLT 155 141*  --   --   --    Basic Metabolic Panel: Recent Labs  Lab 07/25/20 1109 08/08/2020 1021 08/13/2020 1048 07/21/2020 1050 07/20/2020 1409  NA 149* 149* 149* 149* 150*  K 4.4 3.9 3.8 4.1 3.8  CL 117* 114* 113*  --   --   CO2 23 26  --   --   --   GLUCOSE 78 84 76  --   --   BUN 74* 67* 64*  --   --   CREATININE 1.76* 1.96* 2.00*  --   --   CALCIUM 7.8* 7.8*  --   --   --    GFR: Estimated Creatinine Clearance: 19.3 mL/min (A) (by C-G formula based on SCr of 2 mg/dL (H)). Liver Function Tests: Recent Labs  Lab 07/25/20 1109 07/19/2020 1021  AST 18 25  ALT 13 16  ALKPHOS 128* 124  BILITOT 0.5 0.8  PROT 5.1* 4.9*  ALBUMIN 2.1* 2.2*   Recent Labs  Lab 07/23/2020 1021  LIPASE 40   No results for input(s): AMMONIA in the last 168 hours. Coagulation Profile: Recent Labs  Lab 08/12/2020 1021  INR 1.2    Cardiac Enzymes: No results for input(s): CKTOTAL, CKMB, CKMBINDEX, TROPONINI in the last 168 hours. BNP (last 3 results) No results for input(s): PROBNP in the last 8760 hours. HbA1C: No results for input(s): HGBA1C in the last 72 hours. CBG: Recent Labs  Lab 08/16/2020 1018  GLUCAP 76   Lipid Profile: No results for input(s): CHOL, HDL, LDLCALC, TRIG, CHOLHDL, LDLDIRECT in the last 72 hours. Thyroid Function Tests: No results for input(s): TSH, T4TOTAL, FREET4, T3FREE, THYROIDAB in the last 72 hours. Anemia Panel: Recent Labs    07/25/20 1108  FERRITIN 312*  TIBC 165*  IRON 30*  RETICCTPCT 2.1   Urine analysis:    Component Value Date/Time   COLORURINE AMBER (A) 07/02/2020 1056   APPEARANCEUR CLOUDY (A) 07/02/2020 1056   LABSPEC 1.019 07/02/2020 1056   PHURINE 6.0 07/02/2020 1056   GLUCOSEU NEGATIVE 07/02/2020 1056   HGBUR LARGE (A) 07/02/2020 1056   BILIRUBINUR NEGATIVE 07/02/2020 1056   KETONESUR NEGATIVE 07/02/2020 1056   PROTEINUR 30 (A) 07/02/2020 1056   UROBILINOGEN 0.2 11/27/2013 1423   NITRITE NEGATIVE 07/02/2020 1056   LEUKOCYTESUR MODERATE (A) 07/02/2020 1056    Radiological Exams on Admission: No results found.  EKG: Independently reviewed.  Ventricular paced  Assessment/Plan Active Problems:   Acute on chronic systolic CHF (congestive heart failure) (HCC)   CHF (congestive heart failure) ([place])  (please populate well all problems here in Problem List. (For example, if patient is on BP meds at home and you resume or decide to hold them, it is a problem that needs to be her. Same for CAD, COPD, HLD and so on)  Acute hypoxic respite failure -Likely secondary to acute on chronic systolic CHF decompensation, might be pneumonia developing during  when she was unconscious. -1 dose of IV Lasix given.  Clinically patient has significant fluid overload with anasarca and worsening of bilateral pleural effusion on abdomen CT scan.  Discussed with cardiology,  for further diuresis regimen as patient blood pressure on the lower side. -Ceftriaxone to cover putative pneumonia, check procalcitonin -Echo was done recently, no ACEI because of hypotension and CKD -Appreciate ED physician's goal of care conversation with the family at bedside who agreed DNR and request palliative care consultation.  Sepsis -Evidenced by hypothermia, hypoxia, and mentation changes -Condition complicated by CHF decompensation, no IV boluses, will use albumin for hydration for now. -Ceftriaxone to cover putative aspiration pneumonia and a UTI which was treated with 7 days of Keflex since last week after removal of left ureteral stent. -Procalcitonin  Hypernatremia -Paradoxically, has both signs of fluid overload/anasarca and bilateral pleural effusion and intravascular depletion/low BP. -Priority is diuresis as tolerated to help her breathing -Albumin for hydration for now  Colon CA status post right hemicolectomy -No active issue -There was a suspicion of metastatic lung lesions on last admission, but doubt this will contribute to worsening of bilateral pleural effusion this time.  Family prefer conservative management from now. -Wound care consult  Chronic hypotension -Restart midodrine 3 times daily  Permanent A. Fib -No active issue, off systemic anticoagulation for GI bleed concern  CKD stage III -Has a chronic uremia and azotemia, creatinine level stable.  Chronic iron deficiency anemia -H&H stable, continue iron supplement  DVT prophylaxis: Heparin subcu Code Status: DNR Family Communication: Daughter at bedside Disposition Plan: Expect more than 3 days hospital stay for CHF decompensation and sepsis work-up and treatment Consults called: Cardiology Admission status: PCU  Lequita Halt MD [facility] Pager [phone]  08/12/2020, 3:18 PM

## 2020-07-27 NOTE — ED Provider Notes (Signed)
[facility] EMERGENCY DEPARTMENT Provider Note   CSN: [id number] Arrival date & time: 08/15/2020  1013     History No chief complaint on file.   [name] is a 86 y.o. female.   Altered Mental Status Presenting symptoms: lethargy and partial responsiveness   Severity:  Severe Most recent episode:  Today Episode history:  Single Timing:  Constant Progression:  Unchanged Chronicity:  New Context comment:  Unclear cause, was hypoxic and sob with ems      Past Medical History:  Diagnosis Date   Acute lower UTI 11/27/2013   Acute on chronic renal insufficiency    Anemia    Brady-tachy syndrome ([place])    a. 2003: post-op afib after MR repair then symptomatic bradycardia requiring pacemaker. b. Upgrade to Medtronic Bi-V Pacemaker 2007.   Bronchiectasis with acute exacerbation ([place])    Cancer of cecum ([place])    Cellulitis and abscess of foot 08/15/2015   RT FOOT   Chronic atrial fibrillation ([place])    a. Previously failed DCCV/amio/tikosyn.  CHADS2VASC score 5 - not on anticoagulation due to history of GI bleeding.     Chronic combined systolic and diastolic CHF (congestive heart failure) (HCC)    a. EF 35-40% on echo 09/2013 b. echo 04/2016: EF 50-55% with mild to moderate MS.   Chronic diastolic CHF (congestive heart failure) ([place])    EF 35-40% on echo 09/2013 but now 50-55% by echo 04/2016   Cirrhosis ([place])    a. Newly recognized 09/2013 - by CT.   CKD (chronic kidney disease)    Emesis    Gastritis with hemorrhage    GI bleeding    Gout 11/07/2013   History of gout 08/15/2015   Hydronephrosis of left kidney 06/12/2020   Hypertension    Iron deficiency anemia due to chronic blood loss 06/21/2020   Left ureteral stone 06/12/2020   Liver cirrhosis ([place]) 10/10/2013   Mitral valve disorder    a. Severe MR s/p repair 2003 (28 mm annuloplasty ring and and oversew of LAA). No CAD by cath at that time.   NSVT (nonsustained ventricular  tachycardia) ([place])    a. Isolated event during 09/2007 adm.   NSVT (nonsustained ventricular tachycardia) (HCC)    Orthostatic hypotension 10/31/2013   Orthostatic hypotension dysautonomic syndrome (HCC)    Permanent atrial fibrillation ([place]) 10/28/2013   Pulmonary hypertension (HCC)    Group II secondary to CHF and MV disease   Respiratory distress 10/06/2017   Sepsis secondary to UTI ([place]) 06/12/2020   Spinal stenosis    shes recieved epidural steroid injections in the past   Syncope and collapse 04/04/2020   Tricuspid regurgitation     Patient Active Problem List   Diagnosis Date Noted   Acute on chronic diastolic CHF (congestive heart failure) ([place])    Ileus (HCC)    Cancer of cecum (HCC)    Emesis    Gastritis with hemorrhage    Iron deficiency anemia due to chronic blood loss 06/21/2020   Advanced care planning/counseling discussion    Goals of care, counseling/discussion    Palliative care by specialist    DNR (do not resuscitate)    Acute on chronic renal insufficiency    Sepsis secondary to UTI ([place]) 06/12/2020   Hydronephrosis of left kidney 06/12/2020   Left ureteral stone 06/12/2020   GI bleeding 06/11/2020   Obstructive uropathy 06/11/2020   Syncope and collapse 04/04/2020   Influenza A    Bronchiectasis with acute  exacerbation ([place])    Respiratory distress 10/06/2017   Orthostatic hypotension dysautonomic syndrome ([place])    AKI (acute kidney injury) ([place]) 05/21/2016   Cellulitis of leg, right 08/18/2015   NSVT (nonsustained ventricular tachycardia) (HCC)    Gout flare 08/16/2015   Abnormal thyroid function test 08/15/2015   Right ankle pain 08/15/2015   History of gout 08/15/2015   Ankle pain    S/P MVR (mitral valve repair) 01/05/2014   Acute posthemorrhagic anemia 12/29/2013   Encounter for therapeutic drug monitoring 12/12/2013   Acute lower UTI 11/27/2013   Gout 11/07/2013   Orthostatic hypotension  10/31/2013   Chronic diastolic CHF (congestive heart failure) ([place])    Hypertension    Mitral valve disorder    Permanent atrial fibrillation ([place]) 10/28/2013   Biventricular cardiac pacemaker in situ 10/14/2013   Perirectal abscess 10/13/2013   Chronic anticoagulation 10/10/2013   Liver cirrhosis ([place]) 10/10/2013   Thrombocytopenia ([place]) 10/10/2013   Nausea and vomiting 10/10/2013   CKD (chronic kidney disease) stage 3, GFR 30-59 ml/min 10/10/2013   Anemia 10/10/2013    Past Surgical History:  Procedure Laterality Date   APPENDECTOMY     BALLOON DILATION N/A 06/05/2020   Procedure: BALLOON DILATION;  Surgeon: Otis Brace, MD;  Location: [facility];  Service: Gastroenterology;  Laterality: N/A;   BIOPSY  06/05/2020   Procedure: BIOPSY;  Surgeon: Otis Brace, MD;  Location: [facility];  Service: Gastroenterology;;   BIOPSY  06/18/2020   Procedure: BIOPSY;  Surgeon: Wilford Corner, MD;  Location: [facility];  Service: Endoscopy;;   BIV PACEMAKER GENERATOR CHANGE OUT N/A 12/13/2014   Procedure: BIV PACEMAKER GENERATOR CHANGE OUT;  Surgeon: Evans Lance, MD;  Location: [facility] CATH LAB;  Service: Cardiovascular;  Laterality: N/A;   CHOLECYSTECTOMY     COLONOSCOPY N/A 06/18/2020   Procedure: COLONOSCOPY;  Surgeon: Wilford Corner, MD;  Location: [facility];  Service: Endoscopy;  Laterality: N/A;  Either to be done by Dr. Michail Sermon or Dr. Janace Aris WITH STENT PLACEMENT Left 06/11/2020   Procedure: CYSTOSCOPY WITH STENT PLACEMENT;  Surgeon: Irine Seal, MD;  Location: [facility];  Service: Urology;  Laterality: Left;   CYSTOSCOPY/URETEROSCOPY/HOLMIUM LASER/STENT PLACEMENT Left 07/10/2020   Procedure: LEFT URETEROSCOPY/HOLMIUM LASER/STENT EXCHANGE;  Surgeon: Irine Seal, MD;  Location: [facility];  Service: Urology;  Laterality: Left;   ESOPHAGOGASTRODUODENOSCOPY (EGD) WITH PROPOFOL N/A 06/05/2020   Procedure: ESOPHAGOGASTRODUODENOSCOPY (EGD) WITH  PROPOFOL;  Surgeon: Otis Brace, MD;  Location: [facility];  Service: Gastroenterology;  Laterality: N/A;   IRRIGATION AND DEBRIDEMENT ABSCESS N/A 10/14/2013   Procedure: IRRIGATION AND DEBRIDEMENT PERINEAL ABSCESS;  Surgeon: Rolm Bookbinder, MD;  Location: [facility];  Service: General;  Laterality: N/A;   LAPAROSCOPIC RIGHT COLECTOMY Right 06/22/2020   Procedure: LAPAROSCOPIC RIGHT COLECTOMY;  Surgeon: Jovita Kussmaul, MD;  Location: [facility];  Service: General;  Laterality: Right;   SUBMUCOSAL TATTOO INJECTION  06/18/2020   Procedure: SUBMUCOSAL TATTOO INJECTION;  Surgeon: Wilford Corner, MD;  [facility]: [facility];  Service: Endoscopy;;   VALVE REPLACEMENT     sever mitral regurgitation s/p mitral valve annuloplasty ring     OB History   No obstetric history on file.     Family History  Problem Relation Age of Onset   Pancreatic cancer Father    Other Mother        Mother died at 29 with no real medical problems    Social History   Tobacco Use   Smoking status: Former Smoker  Quit date: 10/11/1971    Years since quitting: 48.8   Smokeless tobacco: Never Used  Substance Use Topics   Alcohol use: Yes    Comment: rare   Drug use: No    Home Medications Prior to Admission medications   Medication Sig Start Date End Date Taking? Authorizing Provider  acetaminophen (TYLENOL) 500 MG tablet Take 2 tablets (1,000 mg total) by mouth every 6 (six) hours as needed for mild pain or moderate pain. 07/14/20  Yes Hongalgi, Lenis Dickinson, MD  ascorbic acid (VITAMIN C) 500 MG tablet Take 500 mg by mouth daily.   Yes [provider]  cephALEXin (KEFLEX) 500 MG capsule Take 500 mg by mouth 2 (two) times daily. Started on 07/19/20 for UTI @@ [facility]   Yes [provider]  cetirizine (ZYRTEC) 10 MG tablet Take 10 mg by mouth daily.   Yes [provider]  Febuxostat 80 MG TABS Take 0.5 tablets (40 mg total) by mouth daily. Patient taking differently: Take  80 mg by mouth daily.  07/14/20  Yes Hongalgi, Lenis Dickinson, MD  ferrous sulfate 325 (65 FE) MG tablet Take 1 tablet (325 mg total) by mouth 2 (two) times daily with a meal. 05/22/16  Yes Strader, [place] M, PA-C  midodrine (PROAMATINE) 5 MG tablet Take 1.5 tablets (7.5 mg total) by mouth 3 (three) times daily with meals. Patient taking differently: Take 5 mg by mouth 3 (three) times daily with meals.  07/14/20  Yes Hongalgi, Lenis Dickinson, MD  Multiple Vitamins-Minerals (OCUVITE ADULT 50+) CAPS Take 1 capsule by mouth daily.   Yes [provider]  nutrition supplement, JUVEN, (JUVEN) PACK Take 1 packet by mouth daily. Give with beverage of choice   Yes [provider]  Nutritional Supplements (,FEEDING SUPPLEMENT, PROSOURCE PLUS) liquid Take 30 mLs by mouth 2 (two) times daily between meals. 07/14/20  Yes Hongalgi, Lenis Dickinson, MD  pantoprazole (PROTONIX) 40 MG tablet Take 40 mg by mouth every morning. 05/30/20  Yes [provider]  potassium chloride SA (KLOR-CON) 20 MEQ tablet Take 1 tablet (20 mEq total) by mouth daily. 04/07/20  Yes Vann, Jessica U, DO  risperiDONE (RISPERDAL) 0.5 MG tablet Take 0.5 mg by mouth 2 (two) times daily. As needed for delusions, psychosis, aggressive behavior   Yes [provider]  senna (SENOKOT) 8.6 MG TABS tablet Take 1 tablet by mouth daily.   Yes [provider]  sodium bicarbonate 650 MG tablet Take 1 tablet (650 mg total) by mouth 2 (two) times daily. 07/14/20  Yes Hongalgi, Lenis Dickinson, MD  torsemide (DEMADEX) 20 MG tablet Take 1 tablet (20 mg total) by mouth daily. 04/26/20  Yes Sueanne Margarita, MD    Allergies    Patient has no known allergies.  Review of Systems   Review of Systems  Unable to perform ROS: Acuity of condition    Physical Exam Updated Vital Signs BP (!) 92/51 (BP Location: Right Arm)    Pulse 69    Temp (!) 94 F (34.4 C) (Rectal)    Resp 17    LMP 10/10/2013    SpO2 100%   Physical Exam Vitals and nursing note  reviewed. Exam conducted with a chaperone present.  Constitutional:      Appearance: She is obese. She is ill-appearing and toxic-appearing. She is not diaphoretic.  HENT:     Head: Normocephalic and atraumatic.  Eyes:     General:        Right eye:  No discharge.        Left eye: No discharge.     Conjunctiva/sclera: Conjunctivae normal.     Pupils: Pupils are equal, round, and reactive to light.  Cardiovascular:     Rate and Rhythm: Normal rate.     Heart sounds: No friction rub. No gallop.   Pulmonary:     Breath sounds: Rales present.     Comments: Tachypnea Musculoskeletal:        General: Swelling present.     Right lower leg: Edema present.     Left lower leg: Edema present.     Comments: Significant pitting edema to the knee bilaterally  Skin:    Capillary Refill: Capillary refill takes more than 3 seconds.  Neurological:     Mental Status: She is disoriented.     GCS: GCS eye subscore is 4. GCS verbal subscore is 2. GCS motor subscore is 5.     Motor: Weakness present.     Comments: Patient only grunting with painful stimuli, not following commands, intermittently spontaneously moving extremities minimally, intermittent localization or withdrawal to pain.  Eyes open spontaneously     ED Results / Procedures / Treatments   Labs (all labs ordered are listed, but only abnormal results are displayed) Labs Reviewed  COMPREHENSIVE METABOLIC PANEL - Abnormal; Notable for the following components:      Result Value   Sodium 149 (*)    Chloride 114 (*)    BUN 67 (*)    Creatinine, Ser 1.96 (*)    Calcium 7.8 (*)    Total Protein 4.9 (*)    Albumin 2.2 (*)    GFR calc non Af Amer 22 (*)    GFR calc Af Amer 25 (*)    All other components within normal limits  CBC WITH DIFFERENTIAL/PLATELET - Abnormal; Notable for the following components:   RBC 3.48 (*)    Hemoglobin 9.3 (*)    HCT 33.6 (*)    MCHC 27.7 (*)    RDW 22.8 (*)    Platelets 141 (*)    All other  components within normal limits  APTT - Abnormal; Notable for the following components:   aPTT 45 (*)    All other components within normal limits  BRAIN NATRIURETIC PEPTIDE - Abnormal; Notable for the following components:   B Natriuretic Peptide 298.4 (*)    All other components within normal limits  I-STAT VENOUS BLOOD GAS, ED - Abnormal; Notable for the following components:   pO2, Ven 55.0 (*)    Sodium 149 (*)    Calcium, Ion 1.10 (*)    HCT 30.0 (*)    Hemoglobin 10.2 (*)    All other components within normal limits  I-STAT CHEM 8, ED - Abnormal; Notable for the following components:   Sodium 149 (*)    Chloride 113 (*)    BUN 64 (*)    Creatinine, Ser 2.00 (*)    Calcium, Ion 1.13 (*)    Hemoglobin 9.9 (*)    HCT 29.0 (*)    All other components within normal limits  I-STAT VENOUS BLOOD GAS, ED - Abnormal; Notable for the following components:   pO2, Ven 55.0 (*)    Sodium 150 (*)    Calcium, Ion 1.09 (*)    HCT 28.0 (*)    Hemoglobin 9.5 (*)    All other components within normal limits  TROPONIN I (HIGH SENSITIVITY) - Abnormal; Notable for the following components:   Troponin  I (High Sensitivity) 21 (*)    All other components within normal limits  SARS CORONAVIRUS 2 BY RT PCR (HOSPITAL ORDER, [place] [facility])  CULTURE, BLOOD (SINGLE)  URINE CULTURE  LACTIC ACID, PLASMA  LACTIC ACID, PLASMA  PROTIME-INR  LIPASE, BLOOD  URINALYSIS, ROUTINE W REFLEX MICROSCOPIC  CBG MONITORING, ED  CBG MONITORING, ED  TROPONIN I (HIGH SENSITIVITY)    EKG EKG Interpretation  Date/Time:  Friday July 27 2020 10:18:28 EDT Ventricular Rate:  71 PR Interval:    QRS Duration: 161 QT Interval:  477 QTC Calculation: 519 R Axis:   -68 Text Interpretation: Atrial fibrillation Multiple ventricular premature complexes Nonspecific IVCD with LAD Inferior infarct, old Anterior infarct, old Abnrm T, consider ischemia, anterolateral lds Confirmed by Dewaine Conger  [phone]) on 07/24/2020 11:29:28 AM   Radiology No results found.  Procedures Ultrasound ED Echo  Date/Time: 07/19/2020 11:06 AM Performed by: Breck Coons, MD Authorized by: Breck Coons, MD   Procedure details:    Indications: dyspnea     Views: subxiphoid, parasternal long axis view, parasternal short axis view, apical 4 chamber view and IVC view     Images: archived     Limitations:  Body habitus Findings:    Pericardium: no pericardial effusion     Cardiac Activity: normal cardiac activity     LV Function: depressed (30 - 50%)     RV Diameter: dilated     IVC: dilated   Impression:    Impression: abnormal cardiac activity, probable elevated CVP and decreased contractility   Ultrasound ED Thoracic  Date/Time: 07/23/2020 11:07 AM Performed by: Breck Coons, MD Authorized by: Breck Coons, MD   Procedure details:    Indications: dyspnea     Scope: Heart failure.   Left lung pleural:  Visualized   Right lung pleural:  Visualized   Images: archived   Findings:    B-lines noted throughout: identified   Right Lung Findings:     right lung sliding Left Lung Findings:     left lung sliding Impression:    Impression: pulmonary edema   .Critical Care Performed by: Breck Coons, MD Authorized by: Breck Coons, MD   Critical care provider statement:    Critical care time (minutes):  45   Critical care was necessary to treat or prevent imminent or life-threatening deterioration of the following conditions:  CNS failure or compromise   Critical care was time spent personally by me on the following activities:  Discussions with consultants, evaluation of patient's response to treatment, examination of patient, ordering and performing treatments and interventions, ordering and review of laboratory studies, ordering and review of radiographic studies, pulse oximetry, re-evaluation of patient's condition, obtaining history from patient or surrogate, review of old charts, blood draw  for specimens and development of treatment plan with patient or surrogate   (including critical care time)  Medications Ordered in ED Medications  vancomycin (VANCOREADY) IVPB 1500 mg/300 mL (1,500 mg Intravenous New Bag/Given 08/08/2020 1311)  furosemide (LASIX) injection 80 mg (80 mg Intravenous Given 07/24/2020 1106)  piperacillin-tazobactam (ZOSYN) IVPB 3.375 g (3.375 g Intravenous New Bag/Given 07/24/2020 1315)    ED Course  I have reviewed the triage vital signs and the nursing notes.  Pertinent labs & imaging results that were available during my care of the patient were reviewed by me and considered in my medical decision making (see chart for details).    MDM Rules/Calculators/A&P  Hypothermic patient tachypneic, in respiratory distress in route, placed on CPAP with immediate response, hypoxia went from the 70s to the high 90s.  Here weaned to nasal cannula still satting well.  Lungs with rhonchi throughout, altered mental status from reported baseline documentation and per family.  I spoke to her daughter who says she wishes for no intubation and no chest compressions but would allow [place] to do medical therapy to correct any abnormalities.  Getting i-STAT labs to include a venous blood gas Chem-8 and glucose.  She appears significantly volume overloaded, has a history of heart failure with valve repair, will get labs supporting sepsis versus cardiac dysfunction, versus volume overload versus other metabolic derangement.  Will also get urine studies and CT head.  Bedside ultrasound shows decreased ejection fraction with dilated IVC and B-lines in the lungs, concerns for high volume status, clinically the exam is consistent with that, possible cardiogenic failure, possible neurologic dysfunction metabolic versus infectious.  Family is at bedside and they understand they wish to proceed as is, we will not call code stroke as we do not know last known normal we would not  intervene with TPA or thrombectomy per family wishes, we would not intubate at this time as is now the family's wish either still waiting for i-STAT labs.  Lactic acid is not significant elevated repeat is normal.  I do believe she is volume overloaded, still waiting for BNP.  CT imaging was reviewed by myself and shows no large territory bleed, no large mass lesion causing significant mass-effect.  She has been under the bear hugger for a while and is feeling slightly better per family, she would not open her eyes to verbal stimuli and nod yes or no.  Perhaps hypothermia was leading cause of her symptoms.  This may have been in the setting of volume overload or cardiac output, or infection.  She will need admission for further management.  The patient will be admitted to the hospitalist.  For the remainder this patient's care please see inpatient team notes.  I will intervene as needed while the patient remains in the emergency department.   CRITICAL CARE Performed by: Breck Coons   Total critical care time: 45 minutes  Critical care time was exclusive of separately billable procedures and treating other patients.  Critical care was necessary to treat or prevent imminent or life-threatening deterioration.  Critical care was time spent personally by me on the following activities: development of treatment plan with patient and/or surrogate as well as nursing, discussions with consultants, evaluation of patient's response to treatment, examination of patient, obtaining history from patient or surrogate, ordering and performing treatments and interventions, ordering and review of laboratory studies, ordering and review of radiographic studies, pulse oximetry and re-evaluation of patient's condition.   Final Clinical Impression(s) / ED Diagnoses Final diagnoses:  Altered mental status, unspecified altered mental status type  Hypoxia  Acute pulmonary edema (HCC)  Leg swelling  Acute on chronic  heart failure, unspecified heart failure type ([place])  Hypothermia, initial encounter    Rx / DC Orders ED Discharge Orders    None       Breck Coons, MD 08/08/2020 [phone]

## 2020-07-28 DIAGNOSIS — T68XXXA Hypothermia, initial encounter: Secondary | ICD-10-CM | POA: Insufficient documentation

## 2020-07-28 DIAGNOSIS — R4182 Altered mental status, unspecified: Secondary | ICD-10-CM | POA: Insufficient documentation

## 2020-07-28 DIAGNOSIS — Z9889 Other specified postprocedural states: Secondary | ICD-10-CM

## 2020-07-28 DIAGNOSIS — I4821 Permanent atrial fibrillation: Secondary | ICD-10-CM

## 2020-07-28 DIAGNOSIS — Z789 Other specified health status: Secondary | ICD-10-CM | POA: Insufficient documentation

## 2020-07-28 DIAGNOSIS — Z7189 Other specified counseling: Secondary | ICD-10-CM | POA: Insufficient documentation

## 2020-07-28 DIAGNOSIS — R601 Generalized edema: Secondary | ICD-10-CM | POA: Diagnosis present

## 2020-07-28 DIAGNOSIS — R410 Disorientation, unspecified: Secondary | ICD-10-CM

## 2020-07-28 DIAGNOSIS — Z515 Encounter for palliative care: Secondary | ICD-10-CM

## 2020-07-28 DIAGNOSIS — G903 Multi-system degeneration of the autonomic nervous system: Secondary | ICD-10-CM

## 2020-07-28 DIAGNOSIS — N184 Chronic kidney disease, stage 4 (severe): Secondary | ICD-10-CM

## 2020-07-28 DIAGNOSIS — R0602 Shortness of breath: Secondary | ICD-10-CM | POA: Insufficient documentation

## 2020-07-28 DIAGNOSIS — J9601 Acute respiratory failure with hypoxia: Secondary | ICD-10-CM

## 2020-07-28 DIAGNOSIS — R531 Weakness: Secondary | ICD-10-CM | POA: Insufficient documentation

## 2020-07-28 DIAGNOSIS — G9341 Metabolic encephalopathy: Secondary | ICD-10-CM

## 2020-07-28 DIAGNOSIS — K7460 Unspecified cirrhosis of liver: Secondary | ICD-10-CM

## 2020-07-28 DIAGNOSIS — E870 Hyperosmolality and hypernatremia: Secondary | ICD-10-CM

## 2020-07-28 DIAGNOSIS — M7989 Other specified soft tissue disorders: Secondary | ICD-10-CM | POA: Insufficient documentation

## 2020-07-28 LAB — I-STAT ARTERIAL BLOOD GAS, ED
Acid-base deficit: 1 mmol/L (ref 0.0–2.0)
Bicarbonate: 25.4 mmol/L (ref 20.0–28.0)
Calcium, Ion: 1.12 mmol/L — ABNORMAL LOW (ref 1.15–1.40)
HCT: 25 % — ABNORMAL LOW (ref 36.0–46.0)
Hemoglobin: 8.5 g/dL — ABNORMAL LOW (ref 12.0–15.0)
O2 Saturation: 100 %
Patient temperature: 97.7
Potassium: 3.9 mmol/L (ref 3.5–5.1)
Sodium: 152 mmol/L — ABNORMAL HIGH (ref 135–145)
TCO2: 27 mmol/L (ref 22–32)
pCO2 arterial: 48 mmHg (ref 32.0–48.0)
pH, Arterial: 7.328 — ABNORMAL LOW (ref 7.350–7.450)
pO2, Arterial: 400 mmHg — ABNORMAL HIGH (ref 83.0–108.0)

## 2020-07-28 LAB — COMPREHENSIVE METABOLIC PANEL
ALT: 14 U/L (ref 0–44)
AST: 23 U/L (ref 15–41)
Albumin: 2.8 g/dL — ABNORMAL LOW (ref 3.5–5.0)
Alkaline Phosphatase: 116 U/L (ref 38–126)
Anion gap: 12 (ref 5–15)
BUN: 65 mg/dL — ABNORMAL HIGH (ref 8–23)
CO2: 23 mmol/L (ref 22–32)
Calcium: 7.7 mg/dL — ABNORMAL LOW (ref 8.9–10.3)
Chloride: 115 mmol/L — ABNORMAL HIGH (ref 98–111)
Creatinine, Ser: 2.08 mg/dL — ABNORMAL HIGH (ref 0.44–1.00)
GFR calc Af Amer: 24 mL/min — ABNORMAL LOW (ref >60)
GFR calc non Af Amer: 20 mL/min — ABNORMAL LOW (ref >60)
Glucose, Bld: 60 mg/dL — ABNORMAL LOW (ref 70–99)
Potassium: 4.1 mmol/L (ref 3.5–5.1)
Sodium: 150 mmol/L — ABNORMAL HIGH (ref 135–145)
Total Bilirubin: 1 mg/dL (ref 0.3–1.2)
Total Protein: 5 g/dL — ABNORMAL LOW (ref 6.5–8.1)

## 2020-07-28 LAB — CBC WITH DIFFERENTIAL/PLATELET
Abs Immature Granulocytes: 0.02 10*3/uL (ref 0.00–0.07)
Basophils Absolute: 0 10*3/uL (ref 0.0–0.1)
Basophils Relative: 0 %
Eosinophils Absolute: 0.1 10*3/uL (ref 0.0–0.5)
Eosinophils Relative: 1 %
HCT: 28.2 % — ABNORMAL LOW (ref 36.0–46.0)
Hemoglobin: 7.6 g/dL — ABNORMAL LOW (ref 12.0–15.0)
Immature Granulocytes: 0 %
Lymphocytes Relative: 29 %
Lymphs Abs: 1.7 10*3/uL (ref 0.7–4.0)
MCH: 25.9 pg — ABNORMAL LOW (ref 26.0–34.0)
MCHC: 27 g/dL — ABNORMAL LOW (ref 30.0–36.0)
MCV: 95.9 fL (ref 80.0–100.0)
Monocytes Absolute: 0.5 10*3/uL (ref 0.1–1.0)
Monocytes Relative: 9 %
Neutro Abs: 3.6 10*3/uL (ref 1.7–7.7)
Neutrophils Relative %: 61 %
Platelets: 95 10*3/uL — ABNORMAL LOW (ref 150–400)
RBC: 2.94 MIL/uL — ABNORMAL LOW (ref 3.87–5.11)
RDW: 22.5 % — ABNORMAL HIGH (ref 11.5–15.5)
WBC: 5.9 10*3/uL (ref 4.0–10.5)
nRBC: 0 % (ref 0.0–0.2)

## 2020-07-28 LAB — URINE CULTURE

## 2020-07-28 LAB — LACTIC ACID, PLASMA: Lactic Acid, Venous: 0.7 mmol/L (ref 0.5–1.9)

## 2020-07-28 LAB — PROCALCITONIN: Procalcitonin: 0.21 ng/mL

## 2020-07-28 LAB — C-REACTIVE PROTEIN: CRP: 6.2 mg/dL — ABNORMAL HIGH (ref <1.0)

## 2020-07-28 MED ORDER — HYDROMORPHONE HCL 1 MG/ML IJ SOLN
1.0000 mg | INTRAMUSCULAR | Status: DC | PRN
  Administered 2020-07-28 (×2): 1 mg via INTRAVENOUS
  Filled 2020-07-28 (×2): qty 1

## 2020-07-28 MED ORDER — ACETAMINOPHEN 325 MG PO TABS: 650.0000 mg | ORAL_TABLET | Freq: Four times a day (QID) | ORAL | Status: DC | PRN

## 2020-07-28 MED ORDER — LORAZEPAM 2 MG/ML IJ SOLN: 1.0000 mg | INTRAMUSCULAR | Status: DC | PRN

## 2020-07-28 MED ORDER — GLYCOPYRROLATE 0.2 MG/ML IJ SOLN: 0.2000 mg | INTRAMUSCULAR | Status: DC | PRN

## 2020-07-28 MED ORDER — ONDANSETRON HCL 4 MG/2ML IJ SOLN: 4.0000 mg | Freq: Four times a day (QID) | INTRAMUSCULAR | Status: DC | PRN

## 2020-07-28 MED ORDER — ONDANSETRON 4 MG PO TBDP: 4.0000 mg | ORAL_TABLET | Freq: Four times a day (QID) | ORAL | Status: DC | PRN

## 2020-07-28 MED ORDER — HALOPERIDOL LACTATE 5 MG/ML IJ SOLN: 0.5000 mg | INTRAMUSCULAR | Status: DC | PRN

## 2020-07-28 MED ORDER — BIOTENE DRY MOUTH MT LIQD
15.0000 mL | Freq: Two times a day (BID) | OROMUCOSAL | Status: DC
  Administered 2020-07-28: 15 mL via TOPICAL

## 2020-07-28 MED ORDER — HALOPERIDOL LACTATE 2 MG/ML PO CONC
0.5000 mg | ORAL | Status: DC | PRN
  Filled 2020-07-28: qty 0.3

## 2020-07-28 MED ORDER — NOREPINEPHRINE 4 MG/250ML-% IV SOLN
0.0000 ug/min | INTRAVENOUS | Status: DC
  Filled 2020-07-28: qty 250

## 2020-07-28 MED ORDER — POLYVINYL ALCOHOL 1.4 % OP SOLN
1.0000 [drp] | Freq: Four times a day (QID) | OPHTHALMIC | Status: DC | PRN
  Filled 2020-07-28: qty 15

## 2020-07-28 MED ORDER — ACETAMINOPHEN 650 MG RE SUPP: 650.0000 mg | Freq: Four times a day (QID) | RECTAL | Status: DC | PRN

## 2020-07-28 MED ORDER — LORAZEPAM 2 MG/ML PO CONC: 1.0000 mg | ORAL | Status: DC | PRN

## 2020-07-28 MED ORDER — MORPHINE SULFATE (PF) 2 MG/ML IV SOLN: 1.0000 mg | INTRAVENOUS | Status: DC | PRN

## 2020-07-28 MED ORDER — LORAZEPAM 2 MG/ML IJ SOLN
1.0000 mg | INTRAMUSCULAR | Status: DC | PRN
  Administered 2020-07-28 – 2020-07-29 (×2): 1 mg via INTRAVENOUS
  Filled 2020-07-28 (×2): qty 1

## 2020-07-28 NOTE — ED Notes (Signed)
Pt oxygen dropped into the 80, increased oxygen to 6L, pt then placed on NRB, now at 92%, pt BP then dropped to 08'L systolic

## 2020-07-28 NOTE — Progress Notes (Signed)
Received patient with portable wound vac connected to her abdomen but was not on ; disconnected machine to the patient and gave wound vac to her daughter that was at bedside.

## 2020-07-28 NOTE — Progress Notes (Signed)
Daily Progress Note   Patient Name: [name]       Date: 07/28/2020 DOB: [birth year]  Age: 86 y.o. MRN#: [id number] Attending Physician: Dessa Phi, DO Primary Care Physician: [name], MD Admit Date: 08/12/2020  Reason for Consultation/Follow-up: Establishing goals of care, Non pain symptom management, Pain control, Psychosocial/spiritual support and Terminal Care  Subjective: Chart review performed. Received report from primary RN - no acute concerns.  Went to visit patient at bedside - family was present. Patient was lying in bed awake and alert, but disoriented to time and situation. Stated she was having pain to her sacrum - offered to reposition. She denied shortness of breath. No respiratory distress, increased work of breathing, or secretions noted. Patient was on 4L O2 [place].  Emotional support provided to family. We talked about transition to comfort measures in house and what that would entail inclusive of medications to control pain, dyspnea, agitation, nausea, itching, and hiccups. We discussed stopping all uneccessary measures such as blood draws, needle sticks, and frequent vital signs. Discussed removing telemetry and oxygen. Family was understanding and agreeable. Discussed that patient might be too unstable to transfer to residential hospice due to her low blood pressure. Family understood and expressed that they would prefer the patient stay in house instead of risking her passing during transport.   Emotional support and reflective listening was provided throughout visit.  Addressed all questions/concerns. Encouraged to call with questions/concerns. PMT card was provided.   Length of Stay: 1  Current Medications: Scheduled Meds:    Continuous  Infusions:   PRN Meds: acetaminophen, LORazepam, morphine injection, ondansetron (ZOFRAN) IV, risperiDONE  Physical Exam Constitutional:      General: She is not in acute distress.    Appearance: She is obese. She is ill-appearing.  Pulmonary:     Effort: No respiratory distress.  Skin:    General: Skin is cool and dry.  Neurological:     Mental Status: She is alert. She is disoriented.     Motor: Weakness present.  Psychiatric:        Behavior: Behavior is cooperative.        Cognition and Memory: Cognition is impaired. Memory is impaired.             Vital Signs: BP (!) 93/46    Pulse 69  Temp 97.7 F (36.5 C) (Rectal)    Resp 19    LMP 10/10/2013    SpO2 92%  SpO2: SpO2: 92 % O2 Device: O2 Device: Nasal Cannula O2 Flow Rate: O2 Flow Rate (L/min): 2 L/min  Intake/output summary:   Intake/Output Summary (Last 24 hours) at 07/28/2020 1216 Last data filed at 07/20/2020 2313 Gross per 24 hour  Intake 100 ml  Output --  Net 100 ml   LBM:   Baseline Weight:   Most recent weight:         Palliative Assessment/Data: PPS 20%      Patient Active Problem List   Diagnosis Date Noted   Delirium 07/28/2020   Acute hypoxemic respiratory failure (HCC) 07/28/2020   Hypernatremia 12/87/8676   Acute metabolic encephalopathy 72/07/4708   Altered mental status    Hypothermia    Leg swelling    Encounter for hospice care discussion    Generalized weakness    Shortness of breath    DNI (do not intubate)    Anasarca    Acute on chronic heart failure ([place]) 07/22/2020   Hypoxia    Acute pulmonary edema (HCC)    Acute on chronic diastolic CHF (congestive heart failure) ([place])    Ileus ([place])    Cancer of cecum (HCC)    Emesis    Gastritis with hemorrhage    Iron deficiency anemia due to chronic blood loss 06/21/2020   Advanced care planning/counseling discussion    Goals of care, counseling/discussion    Palliative care by specialist    DNR (do  not resuscitate)    Acute on chronic renal insufficiency    Sepsis secondary to UTI ([place]) 06/12/2020   Hydronephrosis of left kidney 06/12/2020   Left ureteral stone 06/12/2020   GI bleeding 06/11/2020   Obstructive uropathy 06/11/2020   Syncope and collapse 04/04/2020   Influenza A    Bronchiectasis with acute exacerbation ([place])    Respiratory distress 10/06/2017   Orthostatic hypotension dysautonomic syndrome ([place])    AKI (acute kidney injury) ([place]) 05/21/2016   Acute on chronic systolic CHF (congestive heart failure) ([place]) 05/15/2016   Cellulitis of leg, right 08/18/2015   NSVT (nonsustained ventricular tachycardia) (HCC)    Gout flare 08/16/2015   Abnormal thyroid function test 08/15/2015   Right ankle pain 08/15/2015   History of gout 08/15/2015   Ankle pain    S/P MVR (mitral valve repair) 01/05/2014   Acute posthemorrhagic anemia 12/29/2013   Encounter for therapeutic drug monitoring 12/12/2013   Acute lower UTI 11/27/2013   Gout 11/07/2013   Orthostatic hypotension 10/31/2013   Chronic diastolic CHF (congestive heart failure) ([place])    Hypertension    Mitral valve disorder    Permanent atrial fibrillation ([place]) 10/28/2013   Biventricular cardiac pacemaker in situ 10/14/2013   Perirectal abscess 10/13/2013   Chronic anticoagulation 10/10/2013   Liver cirrhosis ([place]) 10/10/2013   Thrombocytopenia ([place]) 10/10/2013   Nausea and vomiting 10/10/2013   CKD (chronic kidney disease), stage IV ([place]) 10/10/2013   Anemia 10/10/2013    Palliative Care Assessment & Plan   Patient Profile: 86 y.o. female  with past medical history of liver cirrhosis, CKD stage III, CHF, atrial fibrillation, pulmonary hypertension, orthostatic hypotension, tachybradycardia syndrome status post biventricular PPM 2007, sever MR status post repair, and recent diagnosis of stage II colon cancer 8/2/21status post right hemicolectomy presented to the ED on 08/13/2020  with shortness of breath and respiratory distress en route to hospital.   Final  ED Clinical Impression: AMS, hypoxia, acute pulmnary edema, leg swelling, acute on chronic heart failure, hypothermia. She was admitted to hospitalist service 08/03/2020.  Admitting Diagnosis: Acute hypoxic respiratory failure, sepsis, hypernatremia.  Of note, patient was recently admitted from 7/26-8/28/21 for sepsis secondary to UTI. During that admission patient was diagnosed with stage II colon adenocarcinoma.  Assessment: Acute hypoxemic respiratory failure Liver cirrhosis CKD Anemia Biventricular PPM Atrial Fibrillation Acute on chronic CHF Anasarca Hypotension Severe MR s/p repair Moderate-severe TR Acute metabolic encephalopathy Delirium Terminal care  Recommendations/Plan: Continue full comfort measures Continue DNR/DNI as previously documented Patient seems unstable for transport to residential hospice - anticipate hospital death, hours to days Added orders to ensure EOL comfort and to reflect full comfort measures, as well as discontinued orders that were not focused on comfort Unrestricted visitation orders were placed per current [place] EOL visitation policy  Provide frequent assessments and administer PRN medications as clinically necessary to ensure EOL comfort  PMT will continue to follow holistically  Goals of Care and Additional Recommendations:  Limitations on Scope of Treatment: Full Comfort Care  Code Status:    Code Status Orders  (From admission, onward)         Start     Ordered   08/12/2020 1455  Do not attempt resuscitation (DNR)  Continuous       Question Answer Comment  In the event of cardiac or respiratory ARREST Do not call a code blue   In the event of cardiac or respiratory ARREST Do not perform Intubation, CPR, defibrillation or ACLS   In the event of cardiac or respiratory ARREST Use medication by any route, position, wound care, and other  measures to relive pain and suffering. May use oxygen, suction and manual treatment of airway obstruction as needed for comfort.      07/24/2020 1457        Code Status History    Date Active Date Inactive Code Status Order ID Comments User Context   08/03/2020 1030 07/31/2020 1457 DNR [id number]  Breck Coons, MD ED   06/21/2020 1027 07/14/2020 2248 Full Code [id number]  Mitzi Hansen, MD Inpatient   06/11/2020 1021 06/21/2020 1027 DNR [id number]  Harold Hedge, MD ED   04/04/2020 2121 04/07/2020 2359 Full Code [id number]  Elwyn Reach, MD ED   10/06/2017 1755 10/09/2017 2211 Full Code [id number]  Theodis Blaze, MD Inpatient   05/15/2016 1603 05/22/2016 1604 Full Code [id number]  Consuelo Pandy, PA-C Inpatient   08/15/2015 0153 08/18/2015 1802 Full Code [id number]  Jules Husbands, MD Inpatient   12/13/2014 1004 12/13/2014 1344 Full Code [id number]  Evans Lance, MD Inpatient   11/27/2013 1944 11/30/2013 1722 Full Code [id number]  Geradine Girt, DO Inpatient   10/10/2013 1605 10/20/2013 1805 DNR [id number]  Caren Griffins, MD ED   Advance Care Planning Activity       Prognosis:   Hours - Days  Discharge Planning:  Anticipated Hospital Death  Care plan was discussed with primary RN, Dr. Maylene Roes, patient, family  Thank you for allowing the Palliative Medicine Team to assist in the care of this patient.   Total Time 35 minutes Prolonged Time Billed  no       Greater than 50%  of this time was spent counseling and coordinating care related to the above assessment and plan.  Lin Landsman, NP  Please contact Palliative Medicine Team phone at [phone] for questions and concerns.

## 2020-07-28 NOTE — ED Notes (Signed)
Dr. Maylene Roes paged regarding patients continuous hypotension. bp 88/49 (map 61), repeat 84/40 (MAP 52)

## 2020-07-28 NOTE — Progress Notes (Signed)
Progress Note  Patient Name: [name] Date of Encounter: 07/28/2020  Primary Cardiologist: [name], MD   Subjective   Very confused. Markedly edematous.    Inpatient Medications    Scheduled Meds: . (feeding supplement) PROSource Plus  30 mL Oral BID BM  . ascorbic acid  500 mg Oral Daily  . febuxostat  80 mg Oral Daily  . ferrous sulfate  325 mg Oral BID WC  . furosemide  80 mg Intravenous BID  . heparin  5,000 Units Subcutaneous Q12H  . loratadine  10 mg Oral Daily  . midodrine  7.5 mg Oral TID WC  . multivitamin  1 tablet Oral Daily  . nutrition supplement (JUVEN)  1 packet Oral Daily  . pantoprazole  40 mg Oral q morning - 10a  . senna  1 tablet Oral Daily  . sodium bicarbonate  650 mg Oral BID  . sodium chloride flush  3 mL Intravenous Q12H   Continuous Infusions: . sodium chloride    . albumin human 25 g (07/28/20 0842)  . cefTRIAXone (ROCEPHIN)  IV Stopped (08/02/2020 2313)   PRN Meds: sodium chloride, acetaminophen, ondansetron (ZOFRAN) IV, risperiDONE, sodium chloride flush   Vital Signs    Vitals:   07/28/20 0615 07/28/20 0715 07/28/20 0830 07/28/20 0915  BP: (!) 95/49 (!) 90/44 (!) 88/43 (!) 79/36  Pulse: 72 69 69 70  Resp: 17 18 (!) 21 20  Temp:      TempSrc:      SpO2: 96% 95% 91% 95%    Intake/Output Summary (Last 24 hours) at 07/28/2020 0958 Last data filed at 08/08/2020 2313 Gross per 24 hour  Intake 100 ml  Output --  Net 100 ml   There were no vitals filed for this visit.  Telemetry    Atrial fibrillation with V pacing - Personally Reviewed  ECG    Atrial fibrillation with Vpaced rhythm - Personally Reviewed  Physical Exam   GEN: ill appearing, markedly edematous HEENT: Normal NECK: No JVD; No carotid bruits LYMPHATICS: No lymphadenopathy CARDIAC:RRR, no murmurs, rubs, gallops RESPIRATORY:  Decreased BS at bases ABDOMEN: Soft, non-tender, non-distended MUSCULOSKELETAL: markedly edematous with anasarca SKIN: Warm  and dry NEUROLOGIC:  Very confused PSYCHIATRIC:  Cannot assess   Labs    Chemistry Recent Labs  Lab 07/25/20 1109 07/25/20 1109 07/28/2020 1021 08/15/2020 1021 07/23/2020 1048 08/05/2020 1050 08/02/2020 1409 07/28/20 0115 07/28/20 0133  NA 149*   < > 149*   < > 149*   < > 150* 150* 152*  K 4.4   < > 3.9   < > 3.8   < > 3.8 4.1 3.9  CL 117*   < > 114*  --  113*  --   --  115*  --   CO2 23  --  26  --   --   --   --  23  --   GLUCOSE 78   < > 84  --  76  --   --  60*  --   BUN 74*   < > 67*  --  64*  --   --  65*  --   CREATININE 1.76*  --  1.96*  --  2.00*  --   --  2.08*  --   CALCIUM 7.8*  --  7.8*  --   --   --   --  7.7*  --   PROT 5.1*  --  4.9*  --   --   --   --  5.0*  --   ALBUMIN 2.1*  --  2.2*  --   --   --   --  2.8*  --   AST 18  --  25  --   --   --   --  23  --   ALT 13  --  16  --   --   --   --  14  --   ALKPHOS 128*  --  124  --   --   --   --  116  --   BILITOT 0.5  --  0.8  --   --   --   --  1.0  --   GFRNONAA 25*  --  22*  --   --   --   --  20*  --   GFRAA 29*  --  25*  --   --   --   --  24*  --   ANIONGAP 9  --  9  --   --   --   --  12  --    < > = values in this interval not displayed.     Hematology Recent Labs  Lab 07/25/20 1109 07/25/20 1109 08/11/2020 1021 07/22/2020 1048 08/11/2020 1409 07/28/20 0115 07/28/20 0133  WBC 5.7  --  6.4  --   --  5.9  --   RBC 3.51*  --  3.48*  --   --  2.94*  --   HGB 9.3*  --  9.3*   < > 9.5* 7.6* 8.5*  HCT 32.7*   < > 33.6*   < > 28.0* 28.2* 25.0*  MCV 93.2  --  96.6  --   --  95.9  --   MCH 26.5  --  26.7  --   --  25.9*  --   MCHC 28.4*  --  27.7*  --   --  27.0*  --   RDW 23.1*  --  22.8*  --   --  22.5*  --   PLT 155  --  141*  --   --  95*  --    < > = values in this interval not displayed.    Cardiac EnzymesNo results for input(s): TROPONINI in the last 168 hours. No results for input(s): TROPIPOC in the last 168 hours.   BNP Recent Labs  Lab 08/13/2020 1022  BNP 298.4*     DDimer No results for  input(s): DDIMER in the last 168 hours.   Radiology    CT ABDOMEN PELVIS WO CONTRAST  Result Date: 08/03/2020 CLINICAL DATA:  Altered mental status. Recent right colectomy for colon cancer. EXAM: CT ABDOMEN AND PELVIS WITHOUT CONTRAST TECHNIQUE: Multidetector CT imaging of the abdomen and pelvis was performed following the standard protocol without IV contrast. COMPARISON:  CT abdomen pelvis dated July 02, 2020. FINDINGS: Lower chest: Moderate bilateral pleural effusions.  Cardiomegaly. Hepatobiliary: Unchanged nodular liver contour. No focal liver the abnormality. Status post cholecystectomy. No biliary dilatation. Pancreas: Unremarkable. No pancreatic ductal dilatation or surrounding inflammatory changes. Spleen: Normal in size.  Unchanged 2.4 cm splenic cyst. Adrenals/Urinary Tract: The adrenal glands are unremarkable. Unchanged punctate bilateral renal calculi. Unchanged 1.0 cm right renal hemorrhagic cyst. Interval removal of the left ureteral stent. The bladder is unremarkable. Stomach/Bowel: The stomach is within normal limits. Postsurgical changes from right colectomy again noted. Mild left-sided colonic diverticulosis. No bowel wall thickening, distention, or surrounding inflammatory changes. Vascular/Lymphatic: Aortic atherosclerosis. Unchanged  1.8 cm calcified splenic artery aneurysm. 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 No enlarged abdominal or pelvic lymph nodes. Reproductive: Uterus and bilateral adnexa are unremarkable. Other: New small ascites.  No pneumoperitoneum. Musculoskeletal: No acute or significant osseous findings. Midline anterior abdominal wall incision with wound VAC in place. No fluid collection. Chronic L4 compression deformity. IMPRESSION: 1. No acute intra-abdominal process. 2. Midline anterior abdominal wall incision with wound VAC in place. No abscess. 3. Unchanged cirrhosis.  New small ascites. 4. Unchanged 3.5 cm aneurysm of the portosplenic  confluence. 5. Unchanged punctate bilateral nephrolithiasis. Interval removal of the left ureteral stent. 6. Moderate bilateral pleural effusions. 7. Aortic Atherosclerosis (ICD10-I70.0). Electronically Signed   By: Titus Dubin M.D.   On: 07/20/2020 14:13   CT Head Wo Contrast  Result Date: 07/31/2020 CLINICAL DATA:  Altered mental status EXAM: CT HEAD WITHOUT CONTRAST TECHNIQUE: Contiguous axial images were obtained from the base of the skull through the vertex without intravenous contrast. COMPARISON:  2015 FINDINGS: Brain: There is no acute intracranial hemorrhage, mass effect, or edema. Gray-white differentiation is preserved. There is no extra-axial fluid collection. Patchy hypoattenuation in the supratentorial white matter is nonspecific but may reflect mild to moderate chronic microvascular ischemic changes. Prominence of ventricles and sulci reflects generalized parenchymal volume loss. Small age-indeterminate right cerebellar infarct. Vascular: There is atherosclerotic calcification at the skull base. Skull: Calvarium is unremarkable. Sinuses/Orbits: No acute finding. Other: None. IMPRESSION: No acute intracranial abnormality. Chronic/nonemergent findings detailed above. Electronically Signed   By: Macy Mis M.D.   On: 07/19/2020 13:58   DG Chest Port 1 View  Result Date: 08/01/2020 CLINICAL DATA:  Questionable sepsis. EXAM: PORTABLE CHEST 1 VIEW COMPARISON:  07/05/2020. FINDINGS: Cardiac pacer noted. Prior median sternotomy. Cardiac valve replacement. Cardiomegaly. Bilateral pulmonary infiltrates/edema. Bilateral moderate pleural effusions. Findings most consistent with CHF. Bilateral pneumonia cannot be excluded. No pneumothorax. IMPRESSION: Cardiac pacer noted. Prior median sternotomy. Prior cardiac valve replacement. Cardiomegaly with diffuse bilateral pulmonary infiltrates/edema and moderate bilateral pleural effusions. Findings most consistent with CHF. Electronically Signed   By:  Marcello Moores  Register   On: 07/28/2020 10:48    Cardiac Studies   2D echo 06/2020 IMPRESSIONS    1. Left ventricular ejection fraction, by estimation, is 40 to 45%. The  left ventricle has mildly decreased function. The left ventricle  demonstrates global hypokinesis. Left ventricular diastolic function could  not be evaluated.  2. Right ventricular systolic function is mildly reduced. The right  ventricular size is mildly enlarged. There is moderately elevated  pulmonary artery systolic pressure. The estimated right ventricular  systolic pressure is 94.7 mmHg.  3. Left atrial size was severely dilated.  4. Right atrial size was severely dilated.  5. The mitral valve has been repaired/replaced. Trivial mitral valve  regurgitation. The mean mitral valve gradient is 4.0 mmHg with average  heart rate of 70 bpm. There is a 28 mm prosthetic annuloplasty ring  present in the mitral position.  6. The pericardial effusion is posterior to the left ventricle. Moderate  pleural effusion in the left lateral region.  7. The tricuspid valve is abnormal. Tricuspid valve regurgitation is  moderate to severe.  8. The aortic valve is tricuspid. Mild aortic valve sclerosis is present,  with no evidence of aortic valve stenosis.  9. The inferior vena cava is dilated in size with <50% respiratory  variability, suggesting right atrial pressure of 15 mmHg.   Comparison(s): Changes from prior study are noted. 04/05/20: LVEF 40%,  severe TR.   Patient Profile     86 y.o. female  with a hx of biventricular HFrEF with EF of 40-45%, orthostatic hypotension dysautonomic syndrome on midodrine, severe MR s/p repair (2003, tachybrady syndrome s/o BiV PPM (2007), permanent Afib, moderate pulmonary hypertension group 2, CKD3b, Cirrhosis, recent dx of adenocarcinoma s/p resection (06/22/20), recent obstructive uropathy s/p stent placement (07/10/2020) who is being seen today for the evaluation of acute exacerbation  of HFrEF at the request of Dr. Roosevelt Locks.  Assessment & Plan    1. Anasarca:  - she is markedly edematous diffusely - Likely multifactorial given worsening in renal function, hypoalbuminemia (hx of cirrhosis and poor PO and Albumin 2.1), and history of HFrEF.  - getting IV Albumin and IV Lasix 80mg  BID -I&Os are not accurate -SCr 2.08 (baseline around 1.76-1.96)  2. Acute on Chronic HFrEF - Home medication includes Torsemide 20 mg and Kdur 20 mEq daily. Unable to tolerate BB, ACEi/ARBs due to hypotension.  - Most recent TTE approximately 3 weeks ago shows stable EF of 40-45% with biventricular failure. She was quite volume overloaded during the study with estimated right atrial pressure of 15 mmhg.  - BNP (298) is not significantly elevated and improved compared to last value.  - Cxray with diffuse CHF - continue IV Lasix  3. Hypotension - Home medication includes Midodrine 7.5 mg TID. - Hypotensive on arrival with MAPs around 65 and remains hypotensive 79/61mmHg (MAP 51) - getting Midodrine and albumin - will start Levo gtt to maintain MAP>65 - needs Palliative Care Consult>>I think she is on the decline and not likely to do well longterm.  - - Would recommend comfort care.  I have discussed this with her daughter, Santiago Glad. - her daughter tells me that she has been at [facility] place and nutrition has been very poor since her surgery for colon CA.  She has not been advancing physically since her surgery and then had PNA.  - Her daughter does not want her to suffer and I explained that we could stabilize her with pressor support but likely she would not recover with any meaningful life going forward given all her co-morbidities.  She is going to talk with her family and make a decision.  4. Permanent Atrial fibrillation  - HR well controlled - CHADS-VASc = 5  - Unable to tolerate anti-coagulation due to previous GI bleed - No further intervention at this time.   5.  Severe MR s/p repair  (2003) - Trivial MR noted on last echo  - Stable at this time.   6. Moderate - Severe TR - Patient is not surgical candidate at this time.   7. Tachybrady syndrome s/p BiV PPM - Stable  I have spent a total of 40 minutes with patient reviewing 2D echo , telemetry, EKGs, labs and examining patient as well as establishing an assessment and plan that was discussed with the patient and extensively with her daughter.  > 50% of time was spent in direct patient care.        For questions or updates, please contact [place] Please consult [email or web address] for contact info under Cardiology/STEMI.      Signed, [name], MD  07/28/2020, 9:58 AM

## 2020-07-28 NOTE — ED Notes (Signed)
Pt requesting drink of water, pt began to choke on water

## 2020-07-28 NOTE — Consult Note (Signed)
WOC Nurse Consult Note: Patient known to [place] service from previous admission (approximately 3 weeks ago). Non complex NPWT previously performed by staff nursing. Reason for Consult:Abdominal wound with NPWT in place. Wound type:Surgical Pressure Injury POA: N/A Measurement:To be obtained with first dressing change Dressing procedure/placement/frequency: Will provide Nursing with guidance for removal of NPWT and placement of silver hydrofiber (Aquacel Advantage, [place] # [id number]) topical care orders for the midline wound. This will be performed daily. The NPWT device is to be placed in a patient belonging bag and discharged with patient for return to the facility. Cannister can be removed and discarded.  [place] nursing team will not follow, but will remain available to this patient, the nursing and medical teams.  Please re-consult if needed. Thanks, Maudie Flakes, MSN, RN, [place], Arther Abbott  Pager# [phone]

## 2020-07-28 NOTE — TOC Initial Note (Signed)
Transition of Care [facility]) - Initial/Assessment Note    Patient Details  Name: [name] MRN: [id number] Date of Birth: Feb 05, 1926  Transition of Care [facility]) CM/SW Contact:    Verdell Carmine, RN Phone Number: 07/28/2020, 11:35 AM  Clinical Narrative:                  Discussions with daughter regarding end of life, palliative on consult. Patient will not be put on pressors. She is a DNR. Morphine and ativan ordered.         Patient Goals and CMS Choice        Expected Discharge Plan and Services                                                Prior Living Arrangements/Services                       Activities of Daily Living      Permission Sought/Granted                  Emotional Assessment              Admission diagnosis:  CHF (congestive heart failure) ([place]) [I50.9] Patient Active Problem List   Diagnosis Date Noted  . Altered mental status   . Hypothermia   . Leg swelling   . Encounter for hospice care discussion   . Generalized weakness   . Shortness of breath   . DNI (do not intubate)   . Anasarca   . Acute on chronic heart failure ([place]) 08/14/2020  . Hypoxia   . Acute pulmonary edema (HCC)   . Acute on chronic diastolic CHF (congestive heart failure) ([place])   . Ileus ([place])   . Cancer of cecum ([place])   . Emesis   . Gastritis with hemorrhage   . Iron deficiency anemia due to chronic blood loss 06/21/2020  . Advanced care planning/counseling discussion   . Goals of care, counseling/discussion   . Palliative care by specialist   . DNR (do not resuscitate)   . Acute on chronic renal insufficiency   . Sepsis secondary to UTI ([place]) 06/12/2020  . Hydronephrosis of left kidney 06/12/2020  . Left ureteral stone 06/12/2020  . GI bleeding 06/11/2020  . Obstructive uropathy 06/11/2020  . Syncope and collapse 04/04/2020  . Influenza A   . Bronchiectasis with acute exacerbation ([place])   . Respiratory distress  10/06/2017  . Orthostatic hypotension dysautonomic syndrome ([place])   . AKI (acute kidney injury) ([place]) 05/21/2016  . Acute on chronic systolic CHF (congestive heart failure) ([place]) 05/15/2016  . Cellulitis of leg, right 08/18/2015  . NSVT (nonsustained ventricular tachycardia) ([place])   . Gout flare 08/16/2015  . Abnormal thyroid function test 08/15/2015  . Right ankle pain 08/15/2015  . History of gout 08/15/2015  . Ankle pain   . S/P MVR (mitral valve repair) 01/05/2014  . Acute posthemorrhagic anemia 12/29/2013  . Encounter for therapeutic drug monitoring 12/12/2013  . Acute lower UTI 11/27/2013  . Gout 11/07/2013  . Orthostatic hypotension 10/31/2013  . Chronic diastolic CHF (congestive heart failure) ([place])   . Hypertension   . Mitral valve disorder   . Permanent atrial fibrillation ([place]) 10/28/2013  . Biventricular cardiac pacemaker in situ 10/14/2013  . Perirectal abscess 10/13/2013  . Chronic anticoagulation  10/10/2013  . Liver cirrhosis ([place]) 10/10/2013  . Thrombocytopenia ([place]) 10/10/2013  . Nausea and vomiting 10/10/2013  . CKD (chronic kidney disease) stage 3, GFR 30-59 ml/min 10/10/2013  . Anemia 10/10/2013   PCP:  [name], MD Pharmacy:   [facility], [address] [place], [place] [address] Phone: [phone] Fax: [phone]  [id number] - [place], [place] - [address] [address] Phone: [phone] Fax: [phone]     Social Determinants of Health (SDOH) Interventions    Readmission Risk Interventions No flowsheet data found.

## 2020-07-28 NOTE — Progress Notes (Addendum)
PROGRESS NOTE    [name]  ZSM:[id number] DOB: 06-10-1926 DOA: 08/11/2020 PCP: [name], MD     Brief Narrative:  [name] is a 86 y.o. female with medical history significant of chronic systolic CHF EF 49-20% 08/711, orthostatic hypotension on midodrine, CKD stage III, tachybradycardia syndrome status post biventricular PPM 2007, severe MR s/p repair, chronic A. fib not on anticoagulation secondary to GI bleed, recent left-sided obstructive kidney stone status post stenting which was removed on 07/19/2020, recent, CAD status post right hemicolectomy 06/21/2020, presented with acute onset hypoxia and unresponsive.  Patient was discharged to SNF 2 weeks ago after hospitalization during which time patient underwent partial colectomy and left ureteral stenting.  Daughter has been visiting patient regularly reported patient has been "building up fluid around her legs" but denied patient had any respiratory symptoms until yesterday patient started to have some dry cough but no complaint of shortness of breath.  This morning, patient was found unresponsive and O2 saturation in the 70s. Patient was placed on nonrebreather 15 L, saturation in the low 90s and switched to CPAP and O2 saturation maintained.  Initially blood pressure was 126/67, chest x-ray showed bilateral pleural effusion and congestion, 1 dose of IV Lasix given.  Later in the ED, it was found patient blood pressure on the lower side, however patient O2 saturation much improved and stabilized on 2 L via nasal cannula.  CT abdomen without contrast done and result pending.  Patient was covered with broad-spectrum antibiotics vancomycin and Zosyn x1 in the ED.  Patient was found to be in hypothermia, temperature 94, placed on Bair hugger.   New events last 24 hours / Subjective: Patient seen and examined. Remains confused, states "you've won the game, it's over"  Later met with daughters at bedside. Daughter would like to move  forward with comfort care, end of life care at this time.   Assessment & Plan:   Principal Problem:   Acute hypoxemic respiratory failure (HCC) Active Problems:   Liver cirrhosis (HCC)   CKD (chronic kidney disease), stage IV (HCC)   Anemia   Biventricular cardiac pacemaker in situ   Permanent atrial fibrillation (HCC)   Mitral valve disorder   S/P MVR (mitral valve repair)   Acute on chronic systolic CHF (congestive heart failure) (HCC)   Orthostatic hypotension dysautonomic syndrome ([place])   Advanced care planning/counseling discussion   Goals of care, counseling/discussion   DNR (do not resuscitate)   Acute on chronic heart failure ([place])   Anasarca   Delirium   Hypernatremia   Acute metabolic encephalopathy Sepsis ruled out    -Comfort care. Appreciate palliative care medicine. Anticipate in hospital death   DVT prophylaxis: None Code Status: DNR Family Communication: Daughters are bedside  Disposition Plan:   Status is: Inpatient. Anticipate in hospital death.    Consultants:   Palliative care medicine  Cardiology   Procedures:   None   Antimicrobials:  Anti-infectives (From admission, onward)   Start     Dose/Rate Route Frequency Ordered Stop    1500  cefTRIAXone (ROCEPHIN) 1 g in sodium chloride 0.9 % 100 mL IVPB  Status:  Discontinued        1 g 200 mL/hr over 30 Minutes Intravenous Every 24 hours 07/26/2020 1457 07/28/20 1104   08/16/2020 1145  vancomycin (VANCOREADY) IVPB 1500 mg/300 mL        1,500 mg 150 mL/hr over 120 Minutes Intravenous STAT  1124 08/01/2020 1543    1130  piperacillin-tazobactam (ZOSYN) IVPB 3.375 g        3.375 g 100 mL/hr over 30 Minutes Intravenous  Once 07/26/2020 1124 08/13/2020 1544        Objective: Vitals:   07/28/20 0715 07/28/20 0830 07/28/20 0915 07/28/20 1000  BP: (!) 90/44 (!) 88/43 (!) 79/36 (!) 93/46  Pulse: 69 69 70 69  Resp: 18 (!) '21 20 19  ' Temp:      TempSrc:      SpO2: 95% 91% 95%  92%    Intake/Output Summary (Last 24 hours) at 07/28/2020 1123 Last data filed at 08/07/2020 2313 Gross per 24 hour  Intake 100 ml  Output --  Net 100 ml   There were no vitals filed for this visit.  Examination: General exam: Appears calm and comfortable, ill-appearing Respiratory system: Clear to auscultation anteriorly. Respiratory effort normal.  Oxygen saturation in the 80s on room air, refusing to keep oxygen on Cardiovascular system: S1 & S2 heard, RRR. No murmurs. + Marked anasarca Gastrointestinal system: Abdomen is nondistended, soft and nontender. + Anasarca Central nervous system: Alert, moves all extremities, remains confused Extremities: Symmetric in appearance  Psychiatry: Judgement and insight appear poor  Data Reviewed: I have personally reviewed following labs and imaging studies  CBC: Recent Labs  Lab 07/25/20 1109 07/25/20 1109 08/16/2020 1021 08/03/2020 1021 08/14/2020 1048 08/12/2020 1050 07/24/2020 1409 07/28/20 0115 07/28/20 0133  WBC 5.7  --  6.4  --   --   --   --  5.9  --   NEUTROABS 3.3  --  2.7  --   --   --   --  3.6  --   HGB 9.3*  --  9.3*   < > 9.9* 10.2* 9.5* 7.6* 8.5*  HCT 32.7*   < > 33.6*   < > 29.0* 30.0* 28.0* 28.2* 25.0*  MCV 93.2  --  96.6  --   --   --   --  95.9  --   PLT 155  --  141*  --   --   --   --  95*  --    < > = values in this interval not displayed.   Basic Metabolic Panel: Recent Labs  Lab 07/25/20 1109 07/25/20 1109 08/04/2020 1021 08/11/2020 1021 07/26/2020 1048 07/25/2020 1050 08/01/2020 1409 07/28/20 0115 07/28/20 0133  NA 149*   < > 149*   < > 149* 149* 150* 150* 152*  K 4.4   < > 3.9   < > 3.8 4.1 3.8 4.1 3.9  CL 117*  --  114*  --  113*  --   --  115*  --   CO2 23  --  26  --   --   --   --  23  --   GLUCOSE 78  --  84  --  76  --   --  60*  --   BUN 74*  --  67*  --  64*  --   --  65*  --   CREATININE 1.76*  --  1.96*  --  2.00*  --   --  2.08*  --   CALCIUM 7.8*  --  7.8*  --   --   --   --  7.7*  --    < > =  values in this interval not displayed.   GFR: Estimated Creatinine Clearance: 18.6 mL/min (A) (by C-G formula based on SCr of 2.08 mg/dL (H)). Liver Function Tests:  Recent Labs  Lab 07/25/20 1109 07/21/2020 1021 07/28/20 0115  AST '18 25 23  ' ALT '13 16 14  ' ALKPHOS 128* 124 116  BILITOT 0.5 0.8 1.0  PROT 5.1* 4.9* 5.0*  ALBUMIN 2.1* 2.2* 2.8*   Recent Labs  Lab 08/09/2020 1021  LIPASE 40   No results for input(s): AMMONIA in the last 168 hours. Coagulation Profile: Recent Labs  Lab 07/26/2020 1021  INR 1.2   Cardiac Enzymes: No results for input(s): CKTOTAL, CKMB, CKMBINDEX, TROPONINI in the last 168 hours. BNP (last 3 results) No results for input(s): PROBNP in the last 8760 hours. HbA1C: No results for input(s): HGBA1C in the last 72 hours. CBG: Recent Labs  Lab 08/02/2020 1018  GLUCAP 76   Lipid Profile: No results for input(s): CHOL, HDL, LDLCALC, TRIG, CHOLHDL, LDLDIRECT in the last 72 hours. Thyroid Function Tests: No results for input(s): TSH, T4TOTAL, FREET4, T3FREE, THYROIDAB in the last 72 hours. Anemia Panel: No results for input(s): VITAMINB12, FOLATE, FERRITIN, TIBC, IRON, RETICCTPCT in the last 72 hours. Sepsis Labs: Recent Labs  Lab 07/22/2020 1021 07/24/2020 1221 07/28/20 0115  PROCALCITON  --   --  0.21  LATICACIDVEN 1.1 0.8 0.7    Recent Results (from the past 240 hour(s))  SARS Coronavirus 2 by RT PCR (hospital order, performed in [facility] hospital lab) Nasopharyngeal Nasopharyngeal Swab     Status: None   Collection Time:  10:38 AM   Specimen: Nasopharyngeal Swab  Result Value Ref Range Status   SARS Coronavirus 2 NEGATIVE NEGATIVE Final    Comment: (NOTE) SARS-CoV-2 target nucleic acids are NOT DETECTED.  The SARS-CoV-2 RNA is generally detectable in upper and lower respiratory specimens during the acute phase of infection. The lowest concentration of SARS-CoV-2 viral copies this assay can detect is 250 copies / mL. A negative  result does not preclude SARS-CoV-2 infection and should not be used as the sole basis for treatment or other patient management decisions.  A negative result may occur with improper specimen collection / handling, submission of specimen other than nasopharyngeal swab, presence of viral mutation(s) within the areas targeted by this assay, and inadequate number of viral copies (<250 copies / mL). A negative result must be combined with clinical observations, patient history, and epidemiological information.  Fact Sheet for Patients:   StrictlyIdeas.no  Fact Sheet for Healthcare Providers: BankingDealers.co.za  This test is not yet approved or  cleared by the [place] FDA and has been authorized for detection and/or diagnosis of SARS-CoV-2 by FDA under an Emergency Use Authorization (EUA).  This EUA will remain in effect (meaning this test can be used) for the duration of the COVID-19 declaration under Section 564(b)(1) of the Act, 21 U.S.C. section 360bbb-3(b)(1), unless the authorization is terminated or revoked sooner.  Performed at [facility], [place] [address] [place], [place] [address]       Radiology Studies: CT ABDOMEN PELVIS WO CONTRAST  Result Date: 08/09/2020 CLINICAL DATA:  Altered mental status. Recent right colectomy for colon cancer. EXAM: CT ABDOMEN AND PELVIS WITHOUT CONTRAST TECHNIQUE: Multidetector CT imaging of the abdomen and pelvis was performed following the standard protocol without IV contrast. COMPARISON:  CT abdomen pelvis dated July 02, 2020. FINDINGS: Lower chest: Moderate bilateral pleural effusions.  Cardiomegaly. Hepatobiliary: Unchanged nodular liver contour. No focal liver the abnormality. Status post cholecystectomy. No biliary dilatation. Pancreas: Unremarkable. No pancreatic ductal dilatation or surrounding inflammatory changes. Spleen: Normal in size.  Unchanged 2.4 cm splenic cyst.  Adrenals/Urinary Tract: The adrenal glands are unremarkable. Unchanged punctate bilateral renal calculi. Unchanged 1.0 cm right renal hemorrhagic cyst. Interval removal of the left ureteral stent. The bladder is unremarkable. Stomach/Bowel: The stomach is within normal limits. Postsurgical changes from right colectomy again noted. Mild left-sided colonic diverticulosis. No bowel wall thickening, distention, or surrounding inflammatory changes. Vascular/Lymphatic: Aortic atherosclerosis. 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 No enlarged abdominal or pelvic lymph nodes. Reproductive: Uterus and bilateral adnexa are unremarkable. Other: New small ascites.  No pneumoperitoneum. Musculoskeletal: No acute or significant osseous findings. Midline anterior abdominal wall incision with wound VAC in place. No fluid collection. Chronic L4 compression deformity. IMPRESSION: 1. No acute intra-abdominal process. 2. Midline anterior abdominal wall incision with wound VAC in place. No abscess. 3. Unchanged cirrhosis.  New small ascites. 4. 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 5. Unchanged punctate bilateral nephrolithiasis. Interval removal of the left ureteral stent. 6. Moderate bilateral pleural effusions. 7. Aortic Atherosclerosis (ICD10-I70.0). Electronically Signed   By: Titus Dubin M.D.   On: 08/05/2020 14:13   CT Head Wo Contrast  Result Date: 07/24/2020 CLINICAL DATA:  Altered mental status EXAM: CT HEAD WITHOUT CONTRAST TECHNIQUE: Contiguous axial images were obtained from the base of the skull through the vertex without intravenous contrast. COMPARISON:  2015 FINDINGS: Brain: There is no acute intracranial hemorrhage, mass effect, or edema. Gray-white differentiation is preserved. There is no extra-axial fluid collection. Patchy hypoattenuation in the supratentorial white matter is nonspecific but may reflect mild to  moderate chronic microvascular ischemic changes. Prominence of ventricles and sulci reflects generalized parenchymal volume loss. Small age-indeterminate right cerebellar infarct. Vascular: There is atherosclerotic calcification at the skull base. Skull: Calvarium is unremarkable. Sinuses/Orbits: No acute finding. Other: None. IMPRESSION: No acute intracranial abnormality. Chronic/nonemergent findings detailed above. Electronically Signed   By: Macy Mis M.D.   On: 08/05/2020 13:58   DG Chest Port 1 View  Result Date: 07/18/2020 CLINICAL DATA:  Questionable sepsis. EXAM: PORTABLE CHEST 1 VIEW COMPARISON:  07/05/2020. FINDINGS: Cardiac pacer noted. Prior median sternotomy. Cardiac valve replacement. Cardiomegaly. Bilateral pulmonary infiltrates/edema. Bilateral moderate pleural effusions. Findings most consistent with CHF. Bilateral pneumonia cannot be excluded. No pneumothorax. IMPRESSION: Cardiac pacer noted. Prior median sternotomy. Prior cardiac valve replacement. Cardiomegaly with diffuse bilateral pulmonary infiltrates/edema and moderate bilateral pleural effusions. Findings most consistent with CHF. Electronically Signed   By: Marcello Moores  Register   On: 07/25/2020 10:48      Scheduled Meds: Continuous Infusions:   LOS: 1 day      Time spent: 45 minutes   Dessa Phi, DO [facility] 07/28/2020, 11:23 AM   Available via Epic secure chat 7am-7pm After these hours, please refer to coverage provider listed on [email or web address]

## 2020-07-28 NOTE — ED Notes (Signed)
Pt taken off NRB and placed on 2L

## 2020-07-28 NOTE — Significant Event (Addendum)
HOSPITAL MEDICINE OVERNIGHT EVENT NOTE    Notified by nursing patient is exhibiting increasing oxygen requirement with persisting severe hypotension with last BP 88/47.  Patient is now on a nonrebreather.   Chart reviewed, patient admitted for suspected acute CHF with diffuse bilateral infiltrates and is on IV Lasix.  Obtaining stat ABG including repeat chemistry, CBCD, Lactic acid, procalcitonin and CRP.    I advised temporarily discontinuing bair hugger as it can sometimes worsen hypotension due to vasodilation.   Vernelle Emerald  MD [facility]   ADDENDUM 2:20AM  ABG resulted revealing markedly elevated PaO2 of 400.  I advised nursing to substantially decrease O2 supplementation to target SpO2 of 92-96%.  [place] 2:34am  Nursing states they are observing the patient aspirate/choke while drinking water.  Will make npo, order SLP eval for AM.  Vernelle Emerald

## 2020-07-28 NOTE — Progress Notes (Signed)
Palliative Care consult reviewed.  Patient has been made comfort care and transferred to inpatient palliative care. Will sign off.

## 2020-07-28 NOTE — ED Notes (Signed)
Pt restless, family requesting pt be medicated.  Ativan given.

## 2020-07-29 DIAGNOSIS — E870 Hyperosmolality and hypernatremia: Secondary | ICD-10-CM

## 2020-07-29 DIAGNOSIS — I059 Rheumatic mitral valve disease, unspecified: Secondary | ICD-10-CM

## 2020-07-29 DIAGNOSIS — Z950 Presence of cardiac pacemaker: Secondary | ICD-10-CM

## 2020-07-29 MED ORDER — HYDROMORPHONE HCL 1 MG/ML IJ SOLN
1.0000 mg | INTRAMUSCULAR | Status: DC
  Administered 2020-07-29: 1 mg via INTRAVENOUS
  Filled 2020-07-29: qty 1

## 2020-08-01 LAB — CULTURE, BLOOD (SINGLE): Culture: NO GROWTH

## 2020-08-17 NOTE — Progress Notes (Signed)
Daily Progress Note   Patient Name: [name]       Date: 08/17/20 DOB: [birth year]  Age: 86 y.o. MRN#: [id number] Attending Physician: Dessa Phi, DO Primary Care Physician: [name], MD Admit Date: 07/21/2020  Reason for Consultation/Follow-up: Non pain symptom management, Pain control, Psychosocial/spiritual support and Terminal Care  Subjective: Chart review performed. Received report from primary RN - no acute concerns.  Went to visit patient at bedside - family was present. Patient was lying in bed asleep, not able to participate in conversation. She did not wake to voice or gentle touch. No signs or non-verbal gestures of pain or discomfort noted. No respiratory distress or secretions noted. She was experiencing increased work of breathing.  Emotional support provided to family. Discussed patient's symptoms and concerns they had from event in ED. Reviewed pain/dyspnea plan and discussed scheduling medication to improve patient's comfort - family was appreciative.   Answered questions about patient's pacemaker during EOL.   Addressed all questions/concerns. Encouraged to call when questions/concerns. PMT card was provided.   Length of Stay: 2  Current Medications: Scheduled Meds:  . antiseptic oral rinse  15 mL Topical BID  .  HYDROmorphone (DILAUDID) injection  1 mg Intravenous Q4H    Continuous Infusions:   PRN Meds: acetaminophen **OR** acetaminophen, glycopyrrolate **OR** glycopyrrolate, haloperidol **OR** haloperidol lactate, HYDROmorphone (DILAUDID) injection, LORazepam **OR** LORazepam, ondansetron **OR** ondansetron (ZOFRAN) IV, polyvinyl alcohol, risperiDONE  Physical Exam Vitals and nursing note reviewed.  Constitutional:      General: She is not  in acute distress.    Appearance: She is ill-appearing.  Pulmonary:     Effort: Tachypnea present. No respiratory distress.  Skin:    General: Skin is cool and dry.  Neurological:     Mental Status: She is lethargic.     Motor: Weakness present.             Vital Signs: BP (!) 65/33 (BP Location: Right Arm)   Pulse 69   Temp 97.7 F (36.5 C) (Rectal)   Resp (!) 24   LMP 10/10/2013   SpO2 94%  SpO2: SpO2: 94 % O2 Device: O2 Device: Room Air O2 Flow Rate: O2 Flow Rate (L/min): 2 L/min  Intake/output summary:   Intake/Output Summary (Last 24 hours) at 08/17/20 1112 Last data filed at August 17, 2020  0900 Gross per 24 hour  Intake 0 ml  Output 100 ml  Net -100 ml   LBM:   Baseline Weight:   Most recent weight:         Palliative Assessment/Data: PPS 10%    Flowsheet Rows     Most Recent Value  Intake Tab  Referral Department Hospitalist  Unit at Time of Referral ER  Palliative Care Primary Diagnosis Pulmonary  Date Notified 07/28/2020  Palliative Care Type New Palliative care  Reason for referral End of Life Care Assistance, Advance Care Planning, Psychosocial or Spiritual support, Clarify Goals of Care, [place]  Date of Admission 07/22/2020  Date first seen by Palliative Care 07/22/2020  # of days Palliative referral response time 0 Day(s)  # of days IP prior to Palliative referral 0  Clinical Assessment  Psychosocial & Spiritual Assessment  Palliative Care Outcomes  Patient/Family meeting held? Yes  Who was at the meeting? daughter  Palliative Care Outcomes Clarified goals of care, Provided psychosocial or spiritual support, Counseled regarding hospice, Provided end of life care assistance      Patient Active Problem List   Diagnosis Date Noted  . Delirium 07/28/2020  . Acute hypoxemic respiratory failure ([place]) 07/28/2020  . Hypernatremia 07/28/2020  . Acute metabolic encephalopathy 91/63/8466  . Altered mental status   . Hypothermia   . Leg  swelling   . Encounter for hospice care discussion   . Generalized weakness   . Shortness of breath   . DNI (do not intubate)   . Anasarca   . Terminal care   . Acute on chronic heart failure ([place]) 08/06/2020  . Hypoxia   . Acute pulmonary edema (H[facility])   . Acute on chronic diastolic CHF (congestive heart failure) ([place])   . Ileus ([place])   . Cancer of cecum ([place])   . Emesis   . Gastritis with hemorrhage   . Iron deficiency anemia due to chronic blood loss 06/21/2020  . Advanced care planning/counseling discussion   . Goals of care, counseling/discussion   . Palliative care by specialist   . DNR (do not resuscitate)   . Acute on chronic renal insufficiency   . Sepsis secondary to UTI ([place]) 06/12/2020  . Hydronephrosis of left kidney 06/12/2020  . Left ureteral stone 06/12/2020  . GI bleeding 06/11/2020  . Obstructive uropathy 06/11/2020  . Syncope and collapse 04/04/2020  . Influenza A   . Bronchiectasis with acute exacerbation ([place])   . Respiratory distress 10/06/2017  . Orthostatic hypotension dysautonomic syndrome ([place])   . AKI (acute kidney injury) ([place]) 05/21/2016  . Acute on chronic systolic CHF (congestive heart failure) ([place]) 05/15/2016  . Cellulitis of leg, right 08/18/2015  . NSVT (nonsustained ventricular tachycardia) ([place])   . Gout flare 08/16/2015  . Abnormal thyroid function test 08/15/2015  . Right ankle pain 08/15/2015  . History of gout 08/15/2015  . Ankle pain   . S/P MVR (mitral valve repair) 01/05/2014  . Acute posthemorrhagic anemia 12/29/2013  . Encounter for therapeutic drug monitoring 12/12/2013  . Acute lower UTI 11/27/2013  . Gout 11/07/2013  . Orthostatic hypotension 10/31/2013  . Chronic diastolic CHF (congestive heart failure) ([place])   . Hypertension   . Mitral valve disorder   . Permanent atrial fibrillation ([place]) 10/28/2013  . Biventricular cardiac pacemaker in situ 10/14/2013  . Perirectal abscess 10/13/2013  . Chronic anticoagulation  10/10/2013  . Liver cirrhosis ([place]) 10/10/2013  . Thrombocytopenia ([place]) 10/10/2013  . Nausea and vomiting  10/10/2013  . CKD (chronic kidney disease), stage IV ([place]) 10/10/2013  . Anemia 10/10/2013    Palliative Care Assessment & Plan   Patient Profile: 86 y.o.femalewith past medical history of liver cirrhosis, CKD stage III, CHF, atrial fibrillation, pulmonary hypertension, orthostatic hypotension, tachybradycardia syndrome status post biventricular PPM 2007, sever MR status post repair, and recent diagnosis of stage II colon cancer 8/2/21status post right hemicolectomy presented [facility] 09/15/2021with shortness of breath and respiratory distress en route to hospital.  Final ED Clinical Impression:AMS, hypoxia, acute pulmnary edema, leg swelling, acute on chronic heart failure, hypothermia. She was admitted to hospitalist service 08/15/2020.  Admitting Diagnosis: Acute hypoxic respiratory failure, sepsis, hypernatremia.  Of note, patient was recently admitted from 7/26-8/28/21 for sepsis secondary to UTI. During that admission patient was diagnosed with stage II colon adenocarcinoma.  Assessment: Acute hypoxemic respiratory failure Liver cirrhosis CKD Anemia Biventricular PPM Atrial Fibrillation Acute on chronic CHF Anasarca Hypotension Severe MR s/p repair Moderate-severe TR Acute metabolic encephalopathy Delirium Terminal care  Recommendations/Plan:  Continue full comfort measures  Continue DNR/DNI as previously documented  Patient seems unstable for transport to residential hospice - anticipate hospital death, hours to days  Added scheduled dilaudid 1mg  IV q4hrs to improve pain/dyspnea/tachypnea at EOL  Provide frequent assessments and administer PRN medications as clinically necessary to ensure EOL comfort  PMT will continue to follow holistically  Goals of Care and Additional Recommendations:  Limitations on Scope of Treatment: Full Comfort  Care  Code Status:    Code Status Orders  (From admission, onward)         Start     Ordered   07/28/20 1218  Do not attempt resuscitation (DNR)  Continuous       Question Answer Comment  In the event of cardiac or respiratory ARREST Do not call a "code blue"   In the event of cardiac or respiratory ARREST Do not perform Intubation, CPR, defibrillation or ACLS   In the event of cardiac or respiratory ARREST Use medication by any route, position, wound care, and other measures to relive pain and suffering. May use oxygen, suction and manual treatment of airway obstruction as needed for comfort.      07/28/20 1224        Code Status History    Date Active Date Inactive Code Status Order ID Comments User Context   07/18/2020 1457 07/28/2020 1224 DNR [id number]  Lequita Halt, MD ED   07/26/2020 1030 07/27/2020 1457 DNR [id number]  Breck Coons, MD ED   06/21/2020 1027 07/14/2020 2248 Full Code [id number]  Mitzi Hansen, MD Inpatient   06/11/2020 1021 06/21/2020 1027 DNR [id number]  Harold Hedge, MD ED   04/04/2020 2121 04/07/2020 2359 Full Code [id number]  Elwyn Reach, MD ED   10/06/2017 1755 10/09/2017 2211 Full Code [id number]  Theodis Blaze, MD Inpatient   05/15/2016 1603 05/22/2016 1604 Full Code [id number]  Consuelo Pandy, PA-C Inpatient   08/15/2015 0153 08/18/2015 1802 Full Code [id number]  Jules Husbands, MD Inpatient   12/13/2014 1004 12/13/2014 1344 Full Code [id number]  Evans Lance, MD Inpatient   11/27/2013 1944 11/30/2013 1722 Full Code [id number]  Geradine Girt, DO Inpatient   10/10/2013 1605 10/20/2013 1805 DNR [id number]  Caren Griffins, MD ED   Advance Care Planning Activity       Prognosis:   Hours - Days  Discharge Planning:  Anticipated Hospital Death  Care plan was discussed with primary RN, family at  bedside  Thank you for allowing the Palliative Medicine Team to assist in the care of this patient.   Total Time 25 minutes Prolonged Time Billed  no        Greater than 50%  of this time was spent counseling and coordinating care related to the above assessment and plan.  Lin Landsman, NP  Please contact Palliative Medicine Team phone at [phone] for questions and concerns.

## 2020-08-17 NOTE — Death Summary Note (Signed)
DEATH SUMMARY   Patient Details  Name: [name] MRN: [id number] DOB: [birth year]  Admission/Discharge Information   Admit Date:  30-Jul-2020  Date of Death: Date of Death: August 01, 2020  Time of Death: Time of Death: 02/09/09  Length of Stay: 2  Referring Physician: [name], MD   Reason(s) for Hospitalization  Acute hypoxemic respiratory failure  Diagnoses  Preliminary cause of death:  Acute hypoxemic respiratory failure   Secondary Diagnoses (including complications and co-morbidities):  Principal Problem:   Acute hypoxemic respiratory failure ([place]) Active Problems:   Liver cirrhosis ([place])   CKD (chronic kidney disease), stage IV (HCC)   Anemia   Biventricular cardiac pacemaker in situ   Permanent atrial fibrillation (HCC)   Mitral valve disorder   S/P MVR (mitral valve repair)   Acute on chronic systolic CHF (congestive heart failure) ([place])   Orthostatic hypotension dysautonomic syndrome ([place])   Advanced care planning/counseling discussion   Goals of care, counseling/discussion   DNR (do not resuscitate)   Acute on chronic heart failure ([place])   Anasarca   Delirium   Hypernatremia   Acute metabolic encephalopathy   Terminal care   Brief Hospital Course (including significant findings, care, treatment, and services provided and events leading to death)  Sherry Kirkes Legauxis a 86 y.o.femalewith medical history significant ofchronic systolic CHF EF 83-41%96/2229, orthostatic hypotension on midodrine, CKD stage III, tachybradycardia syndrome status post biventricular PPM 02/09/2006, severe MR s/p repair, chronic A. fib not on anticoagulation secondary to GI bleed, recent left-sided obstructive kidney stone status post stenting which was removed on 07/19/2020, recent, CAD status post righthemicolectomy08/03/2020,presented with acute onset hypoxia and unresponsive. Patient was discharged to SNF 2 weeks ago after hospitalization during which time patient underwent partial  colectomy and left ureteral stenting. Daughter has been visiting patient regularly reported patient has been "building up fluid around her legs"but denied patient had any respiratory symptoms until yesterday patient started to have some dry cough but no complaint of shortness of breath. This morning,patient was found unresponsive and O2 saturation in the 70s. Patient was placed on nonrebreather 15 L, saturation in the low 90s and switched to CPAP and O2 saturation maintained. Initially blood pressure was 126/67, chest x-ray showed bilateral pleural effusion and congestion, 1 dose of IV Lasix given. Later in the ED, it was found patient blood pressure on the lower side, however patient O2 saturation much improved and stabilized on 2 L via nasal cannula. CT abdomen without contrast with results as below. Patient was covered with broad-spectrum antibiotics vancomycin and Zosyn x1 in the ED. Patient was found to be in hypothermia, temperature 94, placed on Bair hugger.  Patient remained hypotensive and grossly volume overloaded.  She remained confused and altered. Patient was also evaluated by cardiology.  Family eventually decided to transition patient to comfort care.  Palliative care medicine has been involved. Family decided to transition patient to comfort care and patient pronounced deceased on 02-Aug-1983.   Pertinent Labs and Studies  Significant Diagnostic Studies CT ABDOMEN PELVIS WO CONTRAST  Result Date: 2020-07-30 CLINICAL DATA:  Altered mental status. Recent right colectomy for colon cancer. EXAM: CT ABDOMEN AND PELVIS WITHOUT CONTRAST TECHNIQUE: Multidetector CT imaging of the abdomen and pelvis was performed following the standard protocol without IV contrast. COMPARISON:  CT abdomen pelvis dated July 02, 2020. FINDINGS: Lower chest: Moderate bilateral pleural effusions.  Cardiomegaly. Hepatobiliary: Unchanged nodular liver contour. No focal liver the abnormality. Status post cholecystectomy.  No biliary dilatation. Pancreas: Unremarkable. No pancreatic  ductal dilatation or surrounding inflammatory changes. Spleen: Normal in size.  Unchanged 2.4 cm splenic cyst. Adrenals/Urinary Tract: The adrenal glands are unremarkable. Unchanged punctate bilateral renal calculi. Unchanged 1.0 cm right renal hemorrhagic cyst. Interval removal of the left ureteral stent. The bladder is unremarkable. Stomach/Bowel: The stomach is within normal limits. Postsurgical changes from right colectomy again noted. Mild left-sided colonic diverticulosis. No bowel wall thickening, distention, or surrounding inflammatory changes. Vascular/Lymphatic: Aortic atherosclerosis. 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 No enlarged abdominal or pelvic lymph nodes. Reproductive: Uterus and bilateral adnexa are unremarkable. Other: New small ascites.  No pneumoperitoneum. Musculoskeletal: No acute or significant osseous findings. Midline anterior abdominal wall incision with wound VAC in place. No fluid collection. Chronic L4 compression deformity. IMPRESSION: 1. No acute intra-abdominal process. 2. Midline anterior abdominal wall incision with wound VAC in place. No abscess. 3. Unchanged cirrhosis.  New small ascites. 4. 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 5. Unchanged punctate bilateral nephrolithiasis. Interval removal of the left ureteral stent. 6. Moderate bilateral pleural effusions. 7. Aortic Atherosclerosis (ICD10-I70.0). Electronically Signed   By: Titus Dubin M.D.   On: 07/19/2020 14:13   CT ABDOMEN PELVIS WO CONTRAST  Result Date: 07/02/2020 CLINICAL DATA:  Abdominal pain. EXAM: CT ABDOMEN AND PELVIS WITHOUT CONTRAST TECHNIQUE: Multidetector CT imaging of the abdomen and pelvis was performed following the standard protocol without IV contrast. COMPARISON:  June 15, 2020 FINDINGS: Lower chest: Mild atelectasis and/or infiltrate is seen  within the bilateral lung bases and posterior aspect of the left upper lobe. Multiple sternal wires are noted. Hepatobiliary: No focal liver abnormality is seen. Status post cholecystectomy. No biliary dilatation. Pancreas: Unremarkable. No pancreatic ductal dilatation or surrounding inflammatory changes. Spleen: A 3.6 cm diameter cyst is seen within the posteromedial aspect of the spleen. Adrenals/Urinary Tract: Adrenal glands are unremarkable. Kidneys are normal in size. 7 mm and 4 mm round hyperdense foci are seen within the anterior aspect of the mid right kidney. A 2 mm nonobstructing renal stone is noted within the upper pole of the right kidney. A 4 mm nonobstructing renal stone is seen within the anterior aspect of the lower pole of the right kidney. A left-sided endo ureteral stent is noted. A 2 mm nonobstructing renal stone is seen within the mid left kidney. Bladder is unremarkable. Stomach/Bowel: Stomach is within normal limits. The appendix is not clearly identified. Surgical sutures are seen along the expected region of the distal ascending colon. No evidence of bowel dilatation. Noninflamed diverticula are seen within the large bowel. Vascular/Lymphatic: There is marked severity calcification of the abdominal aorta and bilateral common iliac arteries, without evidence of aneurysmal dilatation. 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 No enlarged abdominal or pelvic lymph nodes. Reproductive: Uterus and bilateral adnexa are unremarkable. Other: A surgical defect is seen along the midline of the anterior abdominal wall. No abdominopelvic ascites. Musculoskeletal: Moderate severity degenerative changes seen throughout the lumbar spine with approximately 3 mm retrolisthesis of the L4 vertebral body on L5 and 2 mm retrolisthesis of the L5 vertebral body on S1. IMPRESSION: 1. Mild atelectasis and/or infiltrate within the bilateral lung bases and posterior aspect of the left upper lobe. 2.  Evidence of prior cholecystectomy. 3. Bilateral nonobstructing renal calculi. 4. Colonic diverticulosis. Aortic Atherosclerosis (ICD10-I70.0). Electronically Signed   By: Virgina Norfolk M.D.   On: 07/02/2020 15:08   CT Head Wo Contrast  Result Date: 08/09/2020 CLINICAL  DATA:  Altered mental status EXAM: CT HEAD WITHOUT CONTRAST TECHNIQUE: Contiguous axial images were obtained from the base of the skull through the vertex without intravenous contrast. COMPARISON:  2015 FINDINGS: Brain: There is no acute intracranial hemorrhage, mass effect, or edema. Gray-white differentiation is preserved. There is no extra-axial fluid collection. Patchy hypoattenuation in the supratentorial white matter is nonspecific but may reflect mild to moderate chronic microvascular ischemic changes. Prominence of ventricles and sulci reflects generalized parenchymal volume loss. Small age-indeterminate right cerebellar infarct. Vascular: There is atherosclerotic calcification at the skull base. Skull: Calvarium is unremarkable. Sinuses/Orbits: No acute finding. Other: None. IMPRESSION: No acute intracranial abnormality. Chronic/nonemergent findings detailed above. Electronically Signed   By: Macy Mis M.D.   On: 07/30/2020 13:58   DG Chest Port 1 View  Result Date: 08/16/2020 CLINICAL DATA:  Questionable sepsis. EXAM: PORTABLE CHEST 1 VIEW COMPARISON:  07/05/2020. FINDINGS: Cardiac pacer noted. Prior median sternotomy. Cardiac valve replacement. Cardiomegaly. Bilateral pulmonary infiltrates/edema. Bilateral moderate pleural effusions. Findings most consistent with CHF. Bilateral pneumonia cannot be excluded. No pneumothorax. IMPRESSION: Cardiac pacer noted. Prior median sternotomy. Prior cardiac valve replacement. Cardiomegaly with diffuse bilateral pulmonary infiltrates/edema and moderate bilateral pleural effusions. Findings most consistent with CHF. Electronically Signed   By: Marcello Moores  Register   On: 07/30/2020 10:48   DG  CHEST PORT 1 VIEW  Result Date: 07/10/2020 CLINICAL DATA:  Shortness of breath.  Postoperative exam. EXAM: PORTABLE CHEST 1 VIEW COMPARISON:  07/05/2020 FINDINGS: Stable right IJ central venous catheter. Median sternotomy and cardiac valve replacement. Left-sided AICD. Stable cardiomegaly. Atherosclerotic calcification of the aortic knob. Persistent left-sided pleural effusion. Hazy bibasilar opacities, similar to prior. No pneumothorax. IMPRESSION: Persistent left-sided pleural effusion with hazy bibasilar opacities, similar to prior. Electronically Signed   By: Davina Poke D.O.   On: 07/10/2020 13:37   DG CHEST PORT 1 VIEW  Result Date: 07/05/2020 CLINICAL DATA:  Wheezing. EXAM: PORTABLE CHEST 1 VIEW COMPARISON:  06/22/2020. FINDINGS: Right IJ line in stable position. AICD in stable position. Prior cardiac valve replacement. Cardiomegaly. No pulmonary venous congestion. Persistent unchanged left pleural effusion. Underlying atelectasis and or infiltrate cannot be excluded. Mild right infrahilar infiltrate cannot be excluded. No pneumothorax. IMPRESSION: 1.  Right IJ line in stable position. 2. AICD in stable position. Cardiomegaly. No pulmonary venous congestion. 3. Persistent unchanged left pleural effusion. Underlying atelectasis and or infiltrate cannot be excluded. Mild right infrahilar infiltrate cannot be excluded. Electronically Signed   By: Marcello Moores  Register   On: 07/05/2020 13:06   DG Abd Portable 1V  Result Date: 07/01/2020 CLINICAL DATA:  Post right colectomy 9 days ago.  Emesis. EXAM: PORTABLE ABDOMEN - 1 VIEW COMPARISON:  06/28/2020 FINDINGS: Surgical clips over the right upper quadrant. Skin staples are present vertically over the left mid to lower abdomen. Double-J left-sided internal ureteral stent is unchanged. Bowel gas pattern demonstrates air throughout the colon. No free peritoneal air. Resolution of previously seen mildly dilated small bowel loops. Remainder of the exam is  unchanged. IMPRESSION: 1. Nonobstructive bowel gas pattern. 2. Left-sided double-J internal ureteral stent unchanged. Electronically Signed   By: Marin Olp M.D.   On: 07/01/2020 08:57   DG C-Arm 1-60 Min-No Report  Result Date: 07/10/2020 Fluoroscopy was utilized by the requesting physician.  No radiographic interpretation.   ECHOCARDIOGRAM LIMITED  Result Date: 07/09/2020    ECHOCARDIOGRAM LIMITED REPORT   Patient Name:   LYNLEY KILLILEA Date of Exam: 07/09/2020 Medical Rec #:  [id number]  Height:       62.0 in Accession #:    [id number]     Weight:       208.1 lb Date of Birth:  1926/02/21     BSA:          1.944 m Patient Age:    14 years       BP:           90/46 mmHg Patient Gender: F              HR:           70 bpm. Exam Location:  Inpatient Procedure: Limited Echo, Color Doppler, Cardiac Doppler and Intracardiac            Opacification Agent Indications:    N86.76 Acute systolic (congestive) heart failure  History:        Patient has prior history of Echocardiogram examinations, most                 recent 04/05/2020. CHF, Pacemaker, Arrythmias:Atrial                 Fibrillation; Risk Factors:Hypertension. 2003 28mm Mitral                 Annuloplasty Ring implanted.                  Mitral Valve: 28 mm prosthetic annuloplasty ring valve is                 present in the mitral position.  Sonographer:    Raquel Sarna Senior RDCS Referring Phys: [id number] Donato Heinz  Sonographer Comments: Technically challenging study due to limited acoustic windows. Limited to assess systolic function, valves, and IVC. IMPRESSIONS  1. Left ventricular ejection fraction, by estimation, is 40 to 45%. The left ventricle has mildly decreased function. The left ventricle demonstrates global hypokinesis. Left ventricular diastolic function could not be evaluated.  2. Right ventricular systolic function is mildly reduced. The right ventricular size is mildly enlarged. There is moderately elevated pulmonary artery  systolic pressure. The estimated right ventricular systolic pressure is 96.2 mmHg.  3. Left atrial size was severely dilated.  4. Right atrial size was severely dilated.  5. The mitral valve has been repaired/replaced. Trivial mitral valve regurgitation. The mean mitral valve gradient is 4.0 mmHg with average heart rate of 70 bpm. There is a 28 mm prosthetic annuloplasty ring present in the mitral position.  6. The pericardial effusion is posterior to the left ventricle. Moderate pleural effusion in the left lateral region.  7. The tricuspid valve is abnormal. Tricuspid valve regurgitation is moderate to severe.  8. The aortic valve is tricuspid. Mild aortic valve sclerosis is present, with no evidence of aortic valve stenosis.  9. The inferior vena cava is dilated in size with <50% respiratory variability, suggesting right atrial pressure of 15 mmHg. Comparison(s): Changes from prior study are noted. 04/05/20: LVEF 40%, severe TR. FINDINGS  Left Ventricle: Left ventricular ejection fraction, by estimation, is 40 to 45%. The left ventricle has mildly decreased function. The left ventricle demonstrates global hypokinesis. Definity contrast agent was given IV to delineate the left ventricular  endocardial borders. The left ventricular internal cavity size was normal in size. There is borderline left ventricular hypertrophy. Abnormal (paradoxical) septal motion, consistent with RV pacemaker. Left ventricular diastolic function could not be evaluated due to paced rhythm. Right Ventricle: The right ventricular size is mildly enlarged. No increase in right ventricular  wall thickness. Right ventricular systolic function is mildly reduced. There is moderately elevated pulmonary artery systolic pressure. The tricuspid regurgitant velocity is 2.76 m/s, and with an assumed right atrial pressure of 15 mmHg, the estimated right ventricular systolic pressure is 18.2 mmHg. Left Atrium: Left atrial size was severely dilated. Right  Atrium: Right atrial size was severely dilated. Pericardium: A small pericardial effusion is present. The pericardial effusion is posterior to the left ventricle. Mitral Valve: The mitral valve has been repaired/replaced. Trivial mitral valve regurgitation. There is a 28 mm prosthetic annuloplasty ring present in the mitral position. MV peak gradient, 10.8 mmHg. The mean mitral valve gradient is 4.0 mmHg with average heart rate of 70 bpm. Tricuspid Valve: The tricuspid valve is abnormal. Tricuspid valve regurgitation is moderate to severe. Aortic Valve: The aortic valve is tricuspid. Mild aortic valve sclerosis is present, with no evidence of aortic valve stenosis. Pulmonic Valve: The pulmonic valve was grossly normal. Pulmonic valve regurgitation is trivial. Venous: The inferior vena cava is dilated in size with less than 50% respiratory variability, suggesting right atrial pressure of 15 mmHg. IAS/Shunts: No atrial level shunt detected by color flow Doppler. Additional Comments: A pacer wire is visualized. There is a moderate pleural effusion in the left lateral region. LEFT VENTRICLE PLAX 2D LVIDd:         4.90 cm LVIDs:         3.90 cm LV PW:         0.90 cm LV IVS:        1.00 cm LVOT diam:     1.80 cm LV SV:         36 LV SV Index:   18 LVOT Area:     2.54 cm  RIGHT VENTRICLE RV S prime:     7.83 cm/s TAPSE (M-mode): 1.6 cm LEFT ATRIUM         Index LA diam:    4.70 cm 2.42 cm/m  AORTIC VALVE LVOT Vmax:   69.80 cm/s LVOT Vmean:  54.100 cm/s LVOT VTI:    0.141 m MITRAL VALVE             TRICUSPID VALVE MV Area (PHT): 1.59 cm  TR Peak grad:   30.5 mmHg MV Peak grad:  10.8 mmHg TR Vmax:        276.00 cm/s MV Mean grad:  4.0 mmHg MV Vmax:       1.64 m/s  SHUNTS MV Vmean:      88.3 cm/s Systemic VTI:  0.14 m                          Systemic Diam: 1.80 cm Lyman Bishop MD Electronically signed by Lyman Bishop MD Signature Date/Time: 07/09/2020/10:04:57 AM    Final     Microbiology Recent Results (from the  past 240 hour(s))  Blood culture (routine single)     Status: None (Preliminary result)   Collection Time: 07/26/2020 10:35 AM   Specimen: BLOOD  Result Value Ref Range Status   Specimen Description BLOOD RIGHT ANTECUBITAL  Final   Special Requests   Final    BOTTLES DRAWN AEROBIC AND ANAEROBIC Blood Culture results may not be optimal due to an inadequate volume of blood received in culture bottles   Culture   Final    NO GROWTH 2 DAYS Performed at [facility] [address] [place], [place] [address]    Report Status PENDING  Incomplete  SARS Coronavirus 2 by RT PCR (hospital order, performed in [facility] hospital lab) Nasopharyngeal Nasopharyngeal Swab     Status: None   Collection Time: 07/25/2020 10:38 AM   Specimen: Nasopharyngeal Swab  Result Value Ref Range Status   SARS Coronavirus 2 NEGATIVE NEGATIVE Final    Comment: (NOTE) SARS-CoV-2 target nucleic acids are NOT DETECTED.  The SARS-CoV-2 RNA is generally detectable in upper and lower respiratory specimens during the acute phase of infection. The lowest concentration of SARS-CoV-2 viral copies this assay can detect is 250 copies / mL. A negative result does not preclude SARS-CoV-2 infection and should not be used as the sole basis for treatment or other patient management decisions.  A negative result may occur with improper specimen collection / handling, submission of specimen other than nasopharyngeal swab, presence of viral mutation(s) within the areas targeted by this assay, and inadequate number of viral copies (<250 copies / mL). A negative result must be combined with clinical observations, patient history, and epidemiological information.  Fact Sheet for Patients:   StrictlyIdeas.no  Fact Sheet for Healthcare Providers: BankingDealers.co.za  This test is not yet approved or  cleared by the [place] FDA and has been authorized for detection and/or  diagnosis of SARS-CoV-2 by FDA under an Emergency Use Authorization (EUA).  This EUA will remain in effect (meaning this test can be used) for the duration of the COVID-19 declaration under Section 564(b)(1) of the Act, 21 U.S.C. section 360bbb-3(b)(1), unless the authorization is terminated or revoked sooner.  Performed at [facility], [place] [address] [place], [place] [address]   Urine culture     Status: Abnormal   Collection Time: 07/26/2020  3:57 PM   Specimen: In/Out Cath Urine  Result Value Ref Range Status   Specimen Description IN/OUT CATH URINE  Final   Special Requests   Final    NONE Performed at [facility], [place] [address] [place], [place] [address]    Culture MULTIPLE SPECIES PRESENT, SUGGEST RECOLLECTION (A)  Final   Report Status 07/28/2020 FINAL  Final    Lab Basic Metabolic Panel: Recent Labs  Lab 07/25/20 1109 07/25/20 1109 07/25/2020 1021 08/10/2020 1021 07/30/2020 1048 08/01/2020 1050 08/14/2020 1409 07/28/20 0115 07/28/20 0133  NA 149*   < > 149*   < > 149* 149* 150* 150* 152*  K 4.4   < > 3.9   < > 3.8 4.1 3.8 4.1 3.9  CL 117*  --  114*  --  113*  --   --  115*  --   CO2 23  --  26  --   --   --   --  23  --   GLUCOSE 78  --  84  --  76  --   --  60*  --   BUN 74*  --  67*  --  64*  --   --  65*  --   CREATININE 1.76*  --  1.96*  --  2.00*  --   --  2.08*  --   CALCIUM 7.8*  --  7.8*  --   --   --   --  7.7*  --    < > = values in this interval not displayed.   Liver Function Tests: Recent Labs  Lab 07/25/20 1109 07/26/2020 1021 07/28/20 0115  AST 18 25 23   ALT 13 16 14   ALKPHOS 128* 124 116  BILITOT 0.5 0.8 1.0  PROT 5.1*  4.9* 5.0*  ALBUMIN 2.1* 2.2* 2.8*   Recent Labs  Lab 07/25/2020 1021  LIPASE 40   No results for input(s): AMMONIA in the last 168 hours. CBC: Recent Labs  Lab 07/25/20 1109 07/25/20 1109 08/09/2020 1021 07/26/2020 1021 08/16/2020 1048 08/06/2020 1050 07/19/2020 1409 07/28/20 0115 07/28/20 0133  WBC 5.7  --   6.4  --   --   --   --  5.9  --   NEUTROABS 3.3  --  2.7  --   --   --   --  3.6  --   HGB 9.3*  --  9.3*   < > 9.9* 10.2* 9.5* 7.6* 8.5*  HCT 32.7*   < > 33.6*   < > 29.0* 30.0* 28.0* 28.2* 25.0*  MCV 93.2  --  96.6  --   --   --   --  95.9  --   PLT 155  --  141*  --   --   --   --  95*  --    < > = values in this interval not displayed.   Cardiac Enzymes: No results for input(s): CKTOTAL, CKMB, CKMBINDEX, TROPONINI in the last 168 hours. Sepsis Labs: Recent Labs  Lab 07/25/20 1109 07/21/2020 1021 08/03/2020 1221 07/28/20 0115  PROCALCITON  --   --   --  0.21  WBC 5.7 6.4  --  5.9  LATICACIDVEN  --  1.1 0.8 0.7     Dessa Phi 07/30/2020, 7:46 AM

## 2020-08-17 NOTE — Progress Notes (Signed)
PROGRESS NOTE    [name]  GUR:[id number] DOB: 1926/10/29 DOA: 07/23/2020 PCP: [name], MD     Brief Narrative:  [name] is a 86 y.o. female with medical history significant of chronic systolic CHF EF 28-31% 51/7616, orthostatic hypotension on midodrine, CKD stage III, tachybradycardia syndrome status post biventricular PPM 2007, severe MR s/p repair, chronic A. fib not on anticoagulation secondary to GI bleed, recent left-sided obstructive kidney stone status post stenting which was removed on 07/19/2020, recent, CAD status post right hemicolectomy 06/21/2020, presented with acute onset hypoxia and unresponsive.  Patient was discharged to SNF 2 weeks ago after hospitalization during which time patient underwent partial colectomy and left ureteral stenting.  Daughter has been visiting patient regularly reported patient has been "building up fluid around her legs" but denied patient had any respiratory symptoms until yesterday patient started to have some dry cough but no complaint of shortness of breath.  This morning, patient was found unresponsive and O2 saturation in the 70s. Patient was placed on nonrebreather 15 L, saturation in the low 90s and switched to CPAP and O2 saturation maintained.  Initially blood pressure was 126/67, chest x-ray showed bilateral pleural effusion and congestion, 1 dose of IV Lasix given.  Later in the ED, it was found patient blood pressure on the lower side, however patient O2 saturation much improved and stabilized on 2 L via nasal cannula.  CT abdomen without contrast done and result pending.  Patient was covered with broad-spectrum antibiotics vancomycin and Zosyn x1 in the ED.  Patient was found to be in hypothermia, temperature 94, placed on Bair hugger.  Patient remained hypotensive and grossly volume overloaded.  Patient was also evaluated by cardiology.  Family eventually decided to transition patient to comfort care.  Palliative care medicine has  been involved.  New events last 24 hours / Subjective: Patient laying in bed, unresponsive, but appears comfortable.   Assessment & Plan:   Principal Problem:   Acute hypoxemic respiratory failure (HCC) Active Problems:   Liver cirrhosis (HCC)   CKD (chronic kidney disease), stage IV (HCC)   Anemia   Biventricular cardiac pacemaker in situ   Permanent atrial fibrillation (HCC)   Mitral valve disorder   S/P MVR (mitral valve repair)   Acute on chronic systolic CHF (congestive heart failure) (HCC)   Orthostatic hypotension dysautonomic syndrome ([place])   Advanced care planning/counseling discussion   Goals of care, counseling/discussion   DNR (do not resuscitate)   Acute on chronic heart failure ([place])   Anasarca   Delirium   Hypernatremia   Acute metabolic encephalopathy   Terminal care Sepsis ruled out    -Comfort care. Appreciate palliative care medicine. Anticipate in hospital death   DVT prophylaxis: None Code Status: DNR Family Communication: Family at bedside Disposition Plan:   Status is: Inpatient. Anticipate in hospital death.    Consultants:   Palliative care medicine  Cardiology   Procedures:   None   Antimicrobials:  Anti-infectives (From admission, onward)   Start     Dose/Rate Route Frequency Ordered Stop   07/19/2020 1500  cefTRIAXone (ROCEPHIN) 1 g in sodium chloride 0.9 % 100 mL IVPB  Status:  Discontinued        1 g 200 mL/hr over 30 Minutes Intravenous Every 24 hours 08/11/2020 1457 07/28/20 1104   07/25/2020 1145  vancomycin (VANCOREADY) IVPB 1500 mg/300 mL        1,500 mg 150 mL/hr over 120 Minutes Intravenous STAT 07/26/2020 1124  07/18/2020 1543   08/11/2020 1130  piperacillin-tazobactam (ZOSYN) IVPB 3.375 g        3.375 g 100 mL/hr over 30 Minutes Intravenous  Once 07/28/2020 1124 07/30/2020 1544       Objective: Vitals:   07/28/20 1324 07/28/20 1331 07/28/20 1510 08/27/20 0511  BP:   (!) 82/41 (!) 65/33  Pulse: 68 70 70 69  Resp: 16 (!) 24  18 (!) 24  Temp:      TempSrc:      SpO2: 96% 93% (!) 80% 94%    Intake/Output Summary (Last 24 hours) at 2020-08-27 1221 Last data filed at 08-27-2020 0900 Gross per 24 hour  Intake 0 ml  Output 100 ml  Net -100 ml   There were no vitals filed for this visit.  Examination: General exam: Appears sleeping, comfortable  Data Reviewed: I have personally reviewed following labs and imaging studies  CBC: Recent Labs  Lab 07/25/20 1109 07/25/20 1109 07/24/2020 1021 07/31/2020 1021 07/26/2020 1048 07/20/2020 1050 08/15/2020 1409 07/28/20 0115 07/28/20 0133  WBC 5.7  --  6.4  --   --   --   --  5.9  --   NEUTROABS 3.3  --  2.7  --   --   --   --  3.6  --   HGB 9.3*  --  9.3*   < > 9.9* 10.2* 9.5* 7.6* 8.5*  HCT 32.7*   < > 33.6*   < > 29.0* 30.0* 28.0* 28.2* 25.0*  MCV 93.2  --  96.6  --   --   --   --  95.9  --   PLT 155  --  141*  --   --   --   --  95*  --    < > = values in this interval not displayed.   Basic Metabolic Panel: Recent Labs  Lab 07/25/20 1109 07/25/20 1109 08/07/2020 1021 08/09/2020 1021 07/25/2020 1048 08/15/2020 1050 07/30/2020 1409 07/28/20 0115 07/28/20 0133  NA 149*   < > 149*   < > 149* 149* 150* 150* 152*  K 4.4   < > 3.9   < > 3.8 4.1 3.8 4.1 3.9  CL 117*  --  114*  --  113*  --   --  115*  --   CO2 23  --  26  --   --   --   --  23  --   GLUCOSE 78  --  84  --  76  --   --  60*  --   BUN 74*  --  67*  --  64*  --   --  65*  --   CREATININE 1.76*  --  1.96*  --  2.00*  --   --  2.08*  --   CALCIUM 7.8*  --  7.8*  --   --   --   --  7.7*  --    < > = values in this interval not displayed.   GFR: Estimated Creatinine Clearance: 18.6 mL/min (A) (by C-G formula based on SCr of 2.08 mg/dL (H)). Liver Function Tests: Recent Labs  Lab 07/25/20 1109 07/24/2020 1021 07/28/20 0115  AST 18 25 23   ALT 13 16 14   ALKPHOS 128* 124 116  BILITOT 0.5 0.8 1.0  PROT 5.1* 4.9* 5.0*  ALBUMIN 2.1* 2.2* 2.8*   Recent Labs  Lab 08/06/2020 1021  LIPASE 40   No results  for input(s): AMMONIA in the  last 168 hours. Coagulation Profile: Recent Labs  Lab 07/30/2020 1021  INR 1.2   Cardiac Enzymes: No results for input(s): CKTOTAL, CKMB, CKMBINDEX, TROPONINI in the last 168 hours. BNP (last 3 results) No results for input(s): PROBNP in the last 8760 hours. HbA1C: No results for input(s): HGBA1C in the last 72 hours. CBG: Recent Labs  Lab 08/10/2020 1018  GLUCAP 76   Lipid Profile: No results for input(s): CHOL, HDL, LDLCALC, TRIG, CHOLHDL, LDLDIRECT in the last 72 hours. Thyroid Function Tests: No results for input(s): TSH, T4TOTAL, FREET4, T3FREE, THYROIDAB in the last 72 hours. Anemia Panel: No results for input(s): VITAMINB12, FOLATE, FERRITIN, TIBC, IRON, RETICCTPCT in the last 72 hours. Sepsis Labs: Recent Labs  Lab 07/24/2020 1021 07/31/2020 1221 07/28/20 0115  PROCALCITON  --   --  0.21  LATICACIDVEN 1.1 0.8 0.7    Recent Results (from the past 240 hour(s))  Blood culture (routine single)     Status: None (Preliminary result)   Collection Time: 07/21/2020 10:35 AM   Specimen: BLOOD  Result Value Ref Range Status   Specimen Description BLOOD RIGHT ANTECUBITAL  Final   Special Requests   Final    BOTTLES DRAWN AEROBIC AND ANAEROBIC Blood Culture results may not be optimal due to an inadequate volume of blood received in culture bottles   Culture   Final    NO GROWTH 1 DAY Performed at [facility], [place] [address] [place], [place] [address]    Report Status PENDING  Incomplete  SARS Coronavirus 2 by RT PCR (hospital order, performed in [facility] hospital lab) Nasopharyngeal Nasopharyngeal Swab     Status: None   Collection Time: 08/07/2020 10:38 AM   Specimen: Nasopharyngeal Swab  Result Value Ref Range Status   SARS Coronavirus 2 NEGATIVE NEGATIVE Final    Comment: (NOTE) SARS-CoV-2 target nucleic acids are NOT DETECTED.  The SARS-CoV-2 RNA is generally detectable in upper and lower respiratory specimens during the acute phase  of infection. The lowest concentration of SARS-CoV-2 viral copies this assay can detect is 250 copies / mL. A negative result does not preclude SARS-CoV-2 infection and should not be used as the sole basis for treatment or other patient management decisions.  A negative result may occur with improper specimen collection / handling, submission of specimen other than nasopharyngeal swab, presence of viral mutation(s) within the areas targeted by this assay, and inadequate number of viral copies (<250 copies / mL). A negative result must be combined with clinical observations, patient history, and epidemiological information.  Fact Sheet for Patients:   StrictlyIdeas.no  Fact Sheet for Healthcare Providers: BankingDealers.co.za  This test is not yet approved or  cleared by the [place] FDA and has been authorized for detection and/or diagnosis of SARS-CoV-2 by FDA under an Emergency Use Authorization (EUA).  This EUA will remain in effect (meaning this test can be used) for the duration of the COVID-19 declaration under Section 564(b)(1) of the Act, 21 U.S.C. section 360bbb-3(b)(1), unless the authorization is terminated or revoked sooner.  Performed at [facility], [place] [address] [place], [place] [address]   Urine culture     Status: Abnormal   Collection Time: 07/28/2020  3:57 PM   Specimen: In/Out Cath Urine  Result Value Ref Range Status   Specimen Description IN/OUT CATH URINE  Final   Special Requests   Final    NONE Performed at [facility], [place] [address] [place], [place] [address]  Culture MULTIPLE SPECIES PRESENT, SUGGEST RECOLLECTION (A)  Final   Report Status 07/28/2020 FINAL  Final      Radiology Studies: CT ABDOMEN PELVIS WO CONTRAST  Result Date: 08/04/2020 CLINICAL DATA:  Altered mental status. Recent right colectomy for colon cancer. EXAM: CT ABDOMEN AND PELVIS WITHOUT CONTRAST TECHNIQUE:  Multidetector CT imaging of the abdomen and pelvis was performed following the standard protocol without IV contrast. COMPARISON:  CT abdomen pelvis dated July 02, 2020. FINDINGS: Lower chest: Moderate bilateral pleural effusions.  Cardiomegaly. Hepatobiliary: Unchanged nodular liver contour. No focal liver the abnormality. Status post cholecystectomy. No biliary dilatation. Pancreas: Unremarkable. No pancreatic ductal dilatation or surrounding inflammatory changes. Spleen: Normal in size.  Unchanged 2.4 cm splenic cyst. Adrenals/Urinary Tract: The adrenal glands are unremarkable. Unchanged punctate bilateral renal calculi. Unchanged 1.0 cm right renal hemorrhagic cyst. Interval removal of the left ureteral stent. The bladder is unremarkable. Stomach/Bowel: The stomach is within normal limits. Postsurgical changes from right colectomy again noted. Mild left-sided colonic diverticulosis. No bowel wall thickening, distention, or surrounding inflammatory changes. Vascular/Lymphatic: Aortic atherosclerosis. 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 No enlarged abdominal or pelvic lymph nodes. Reproductive: Uterus and bilateral adnexa are unremarkable. Other: New small ascites.  No pneumoperitoneum. Musculoskeletal: No acute or significant osseous findings. Midline anterior abdominal wall incision with wound VAC in place. No fluid collection. Chronic L4 compression deformity. IMPRESSION: 1. No acute intra-abdominal process. 2. Midline anterior abdominal wall incision with wound VAC in place. No abscess. 3. Unchanged cirrhosis.  New small ascites. 4. 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 5. Unchanged punctate bilateral nephrolithiasis. Interval removal of the left ureteral stent. 6. Moderate bilateral pleural effusions. 7. Aortic Atherosclerosis (ICD10-I70.0). Electronically Signed   By: Titus Dubin M.D.   On: 08/13/2020 14:13    CT Head Wo Contrast  Result Date: 08/05/2020 CLINICAL DATA:  Altered mental status EXAM: CT HEAD WITHOUT CONTRAST TECHNIQUE: Contiguous axial images were obtained from the base of the skull through the vertex without intravenous contrast. COMPARISON:  2015 FINDINGS: Brain: There is no acute intracranial hemorrhage, mass effect, or edema. Gray-white differentiation is preserved. There is no extra-axial fluid collection. Patchy hypoattenuation in the supratentorial white matter is nonspecific but may reflect mild to moderate chronic microvascular ischemic changes. Prominence of ventricles and sulci reflects generalized parenchymal volume loss. Small age-indeterminate right cerebellar infarct. Vascular: There is atherosclerotic calcification at the skull base. Skull: Calvarium is unremarkable. Sinuses/Orbits: No acute finding. Other: None. IMPRESSION: No acute intracranial abnormality. Chronic/nonemergent findings detailed above. Electronically Signed   By: Macy Mis M.D.   On: 08/05/2020 13:58      Scheduled Meds: . antiseptic oral rinse  15 mL Topical BID  .  HYDROmorphone (DILAUDID) injection  1 mg Intravenous Q4H   Continuous Infusions:   LOS: 2 days      Time spent: 15 minutes   Dessa Phi, DO [facility] Aug 11, 2020, 12:21 PM   Available via Epic secure chat 7am-7pm After these hours, please refer to coverage provider listed on [email or web address]

## 2020-08-17 DEATH — deceased

## 2020-10-24 ENCOUNTER — Other Ambulatory Visit: Payer: Medicare Other

## 2020-10-24 ENCOUNTER — Ambulatory Visit: Payer: Medicare Other | Admitting: Hematology and Oncology

## 2021-10-17 IMAGING — CT CT ABD-PELV W/O CM
2 of 4 series · 15 of 46 positions shown, 17 images · non-contrast
Comparison: October 10, 2013

CLINICAL DATA: Abdominal pain with fever

EXAM:
CT ABDOMEN AND PELVIS WITHOUT CONTRAST
TECHNIQUE: Multidetector CT imaging of the abdomen and pelvis was performed
following the standard protocol without IV contrast. Oral contrast
was administered.

[Series 2: axial st · axial · 0.98mm/px · z∈[-495,-70]mm · 12 of 97 slices shown, 14 images]
[im 6/97  soft-tissue]
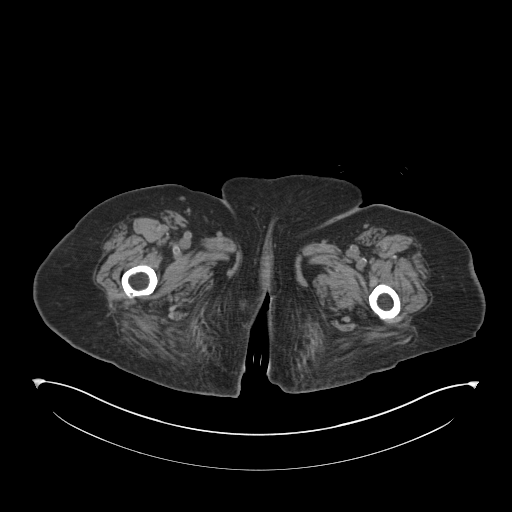
[im 6/97  bone]
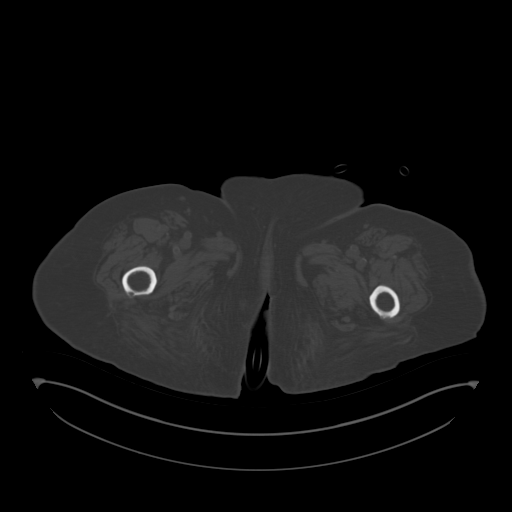
[im 17/97  soft-tissue]
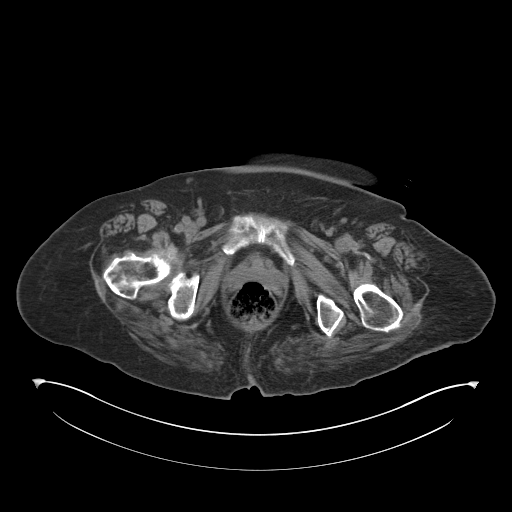
[im 22/97  soft-tissue]
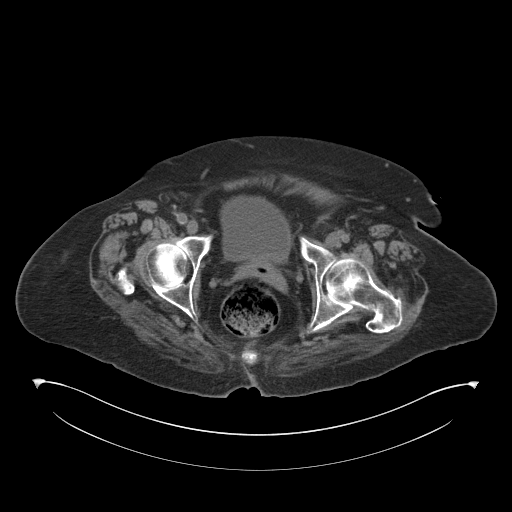
[im 27/97  soft-tissue]
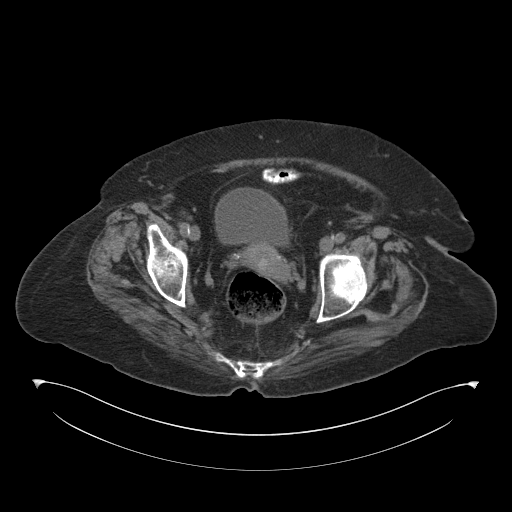
[im 38/97  soft-tissue]
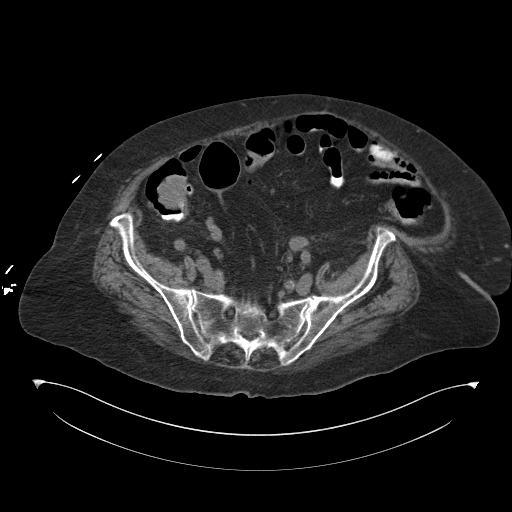
[im 43/97  soft-tissue]
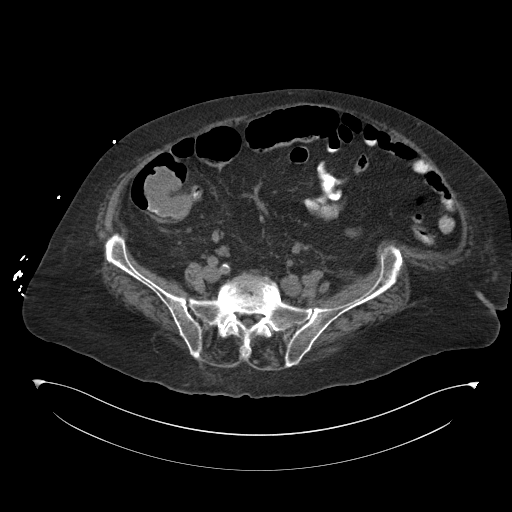
[im 54/97  soft-tissue]
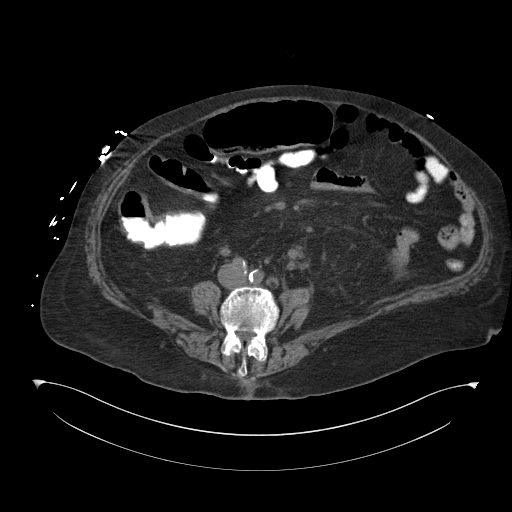
[im 59/97  soft-tissue]
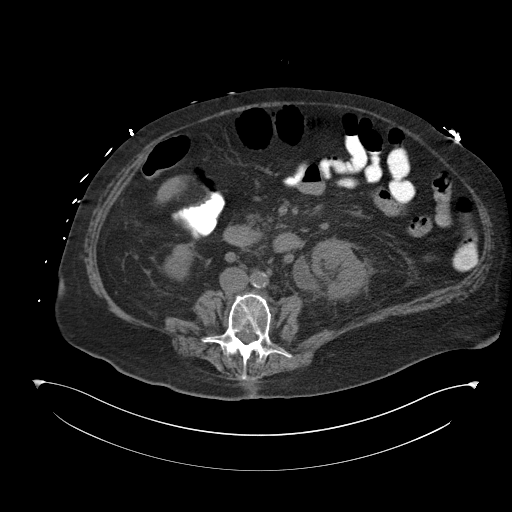
[im 70/97  soft-tissue]
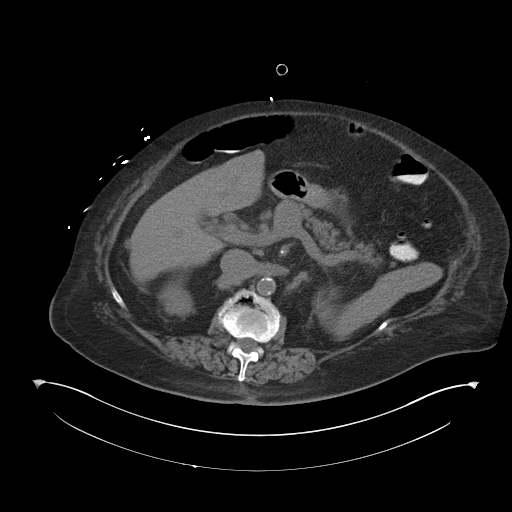
[im 70/97  bone]
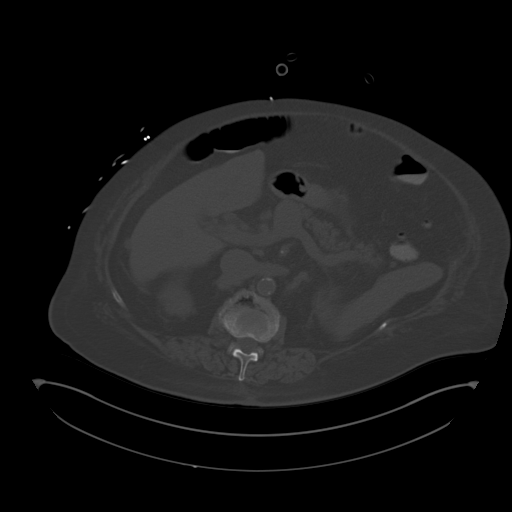
[im 75/97  soft-tissue]
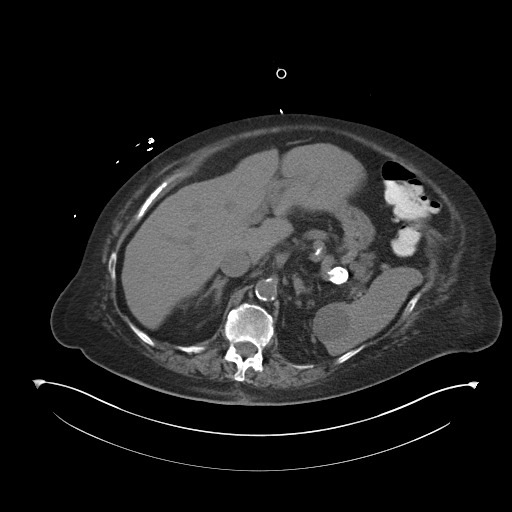
[im 81/97  soft-tissue]
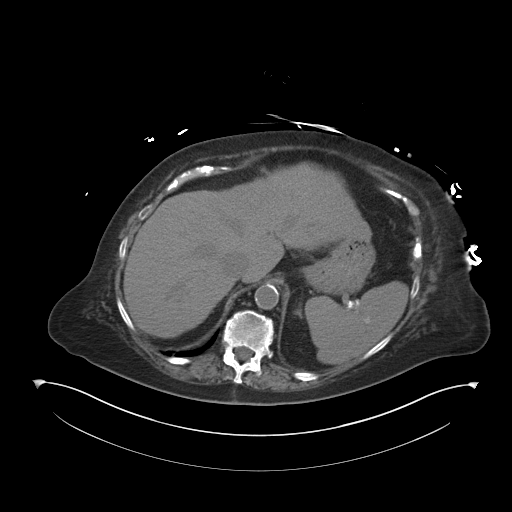
[im 91/97  soft-tissue]
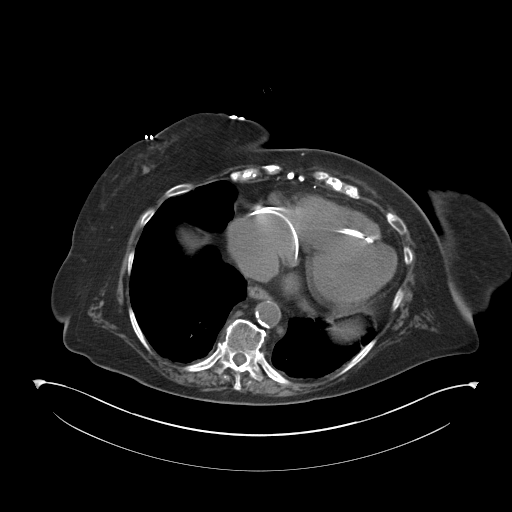

[Series 5: coronal st · coronal · 0.98mm/px · 3 of 172 slices shown]
[im 58/172  soft-tissue]
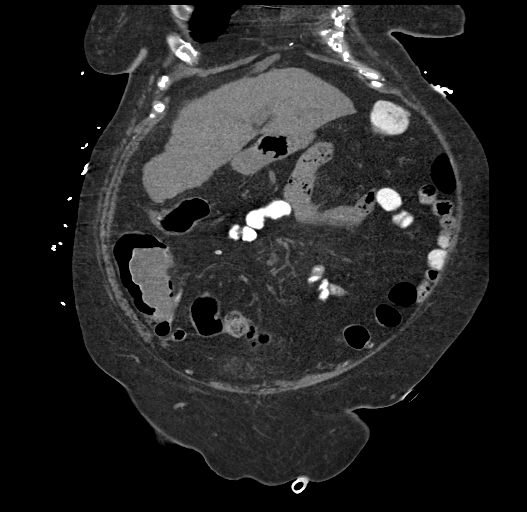
[im 77/172  soft-tissue]
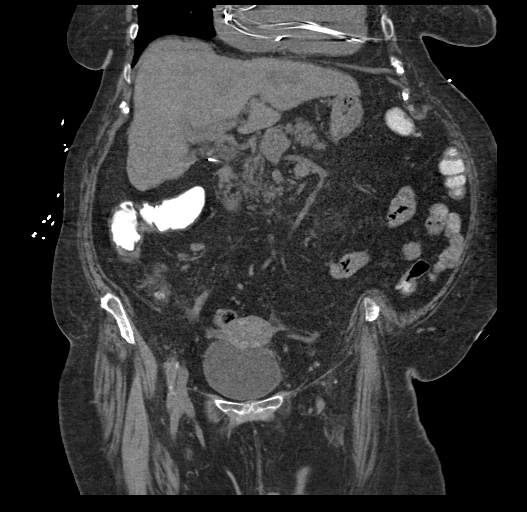
[im 96/172  soft-tissue]
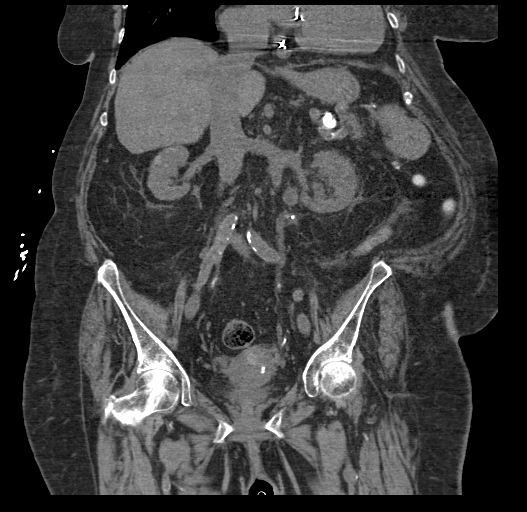

[15 of 46 positions shown; findings below may reference images not displayed]

FINDINGS: Lower chest: There is bibasilar atelectasis. Pacemaker leads are
attached to the right atrium and right ventricle. Evidence of
prosthetic mitral valve.

Hepatobiliary: The liver contour is subtly nodular, a finding also
noted previously. No focal liver lesions are appreciable on this
noncontrast enhanced study. Gallbladder absent. There is no
appreciable biliary duct dilatation.

Pancreas: There is no pancreatic mass or inflammatory focus.

Spleen: There is a cyst arising from the spleen measuring 3.4 x
cm. There is a calcified splenic artery aneurysm measuring 1.6 x
cm, stable.

Adrenals/Urinary Tract: Adrenals bilaterally appear unremarkable.
There are suspected hyperdense cysts in the right kidney, largest
measuring 9 x 9 mm. There is a 1.5 x 1.2 cm cyst arising from the
upper pole of the left kidney. There is moderate hydronephrosis on
the left. There is no hydronephrosis on the right. There is a 7 x 3
mm calculus in the lower pole of the left kidney. There is a 2 mm
calculus with a nearby 1 mm calculus in the lower pole of the right
kidney. There is a 1 mm calculus in the mid right kidney. There is a
calculus in the proximal left ureter at the L4 level measuring 7 x 5
mm with an adjacent 2 mm calculus in this region. At the upper
sacral level, there is an additional calculus in the left ureter
measuring 7 x 4 mm. No ureteral calculus evident on the right.
Urinary bladder is midline with wall thickness within normal limits.

Stomach/Bowel: There are sigmoid diverticula without diverticulitis.
There is a soft tissue masslike area in the cecum measuring 4.9 x
4.2 cm which has attenuation value slightly higher than is expected
with serous fluid. There is no appreciable bowel wall or mesenteric
thickening. There is no evident bowel obstruction. There is no free
air or portal venous air evident.

Vascular/Lymphatic: There is aortic and iliac artery
atherosclerosis. No aneurysm is evident in the aorta. There is no
appreciable adenopathy in the abdomen or pelvis.

Reproductive: The uterus is anteverted. There are several
intrauterine calcifications, likely due to leiomyomatous change. No
extrauterine pelvic mass evident.

Other: The appendix is absent. No periappendiceal region
inflammation. No abscess or ascites is evident in the abdomen or
pelvis.

There is mesenteric thickening in the mid abdomen without bowel or
vascular distortion.

Musculoskeletal: Bones are osteoporotic. There is stable anterior
wedging at L4. There is a degree of spinal stenosis at L2-3 and L3-4
due to bony hypertrophy. No blastic or lytic lesions are evident.
There are no intramuscular lesions.
IMPRESSION: 1. Moderate hydronephrosis on the left. There are calculi in the mid
ureteral region on the left. A calculus at the level of L4 measures
7 x 5 mm with an adjacent 2 mm calculus. There is a 7 x 4 mm
calculus at the upper sacral level on the left as well.

2. Intrarenal calculi bilaterally. Probable hyperdense cysts in
right kidney.

3. Soft tissue fullness in the cecal region. This area may represent
localized stool. Given the somewhat masslike appearance in this
area, it may be reasonable to consider direct visualization after
appropriate colonic cleansing.

4. Suspect a degree of sclerosing mesenteritis without distortion of
vascularity or bowel.

5.  Apparent degree of hepatic cirrhosis.

7.  Gallbladder absent.

8.  Aortic Atherosclerosis ([id number]).

9.  Suspected small calcified uterine leiomyomas.

10. Degree of spinal stenosis at L2-3 and L3-4 due to bony
hypertrophy.
